# Patient Record
Sex: Female | Born: 1965 | Race: White | Hispanic: No | Smoking: Current every day smoker
Health system: Southern US, Community
[De-identification: ages and names within clinical notes are randomized; demographics above are authoritative.]

## PROBLEM LIST (undated history)

## (undated) ENCOUNTER — Emergency Department (HOSPITAL_COMMUNITY): Admission: EM | Payer: Medicaid Other | Source: Home / Self Care

## (undated) ENCOUNTER — Emergency Department (HOSPITAL_BASED_OUTPATIENT_CLINIC_OR_DEPARTMENT_OTHER): Payer: Medicaid Other | Source: Home / Self Care

## (undated) DIAGNOSIS — M545 Low back pain, unspecified: Secondary | ICD-10-CM

## (undated) DIAGNOSIS — J449 Chronic obstructive pulmonary disease, unspecified: Secondary | ICD-10-CM

## (undated) DIAGNOSIS — G8929 Other chronic pain: Secondary | ICD-10-CM

## (undated) DIAGNOSIS — F1410 Cocaine abuse, uncomplicated: Secondary | ICD-10-CM

## (undated) DIAGNOSIS — I10 Essential (primary) hypertension: Secondary | ICD-10-CM

## (undated) DIAGNOSIS — R011 Cardiac murmur, unspecified: Secondary | ICD-10-CM

## (undated) DIAGNOSIS — I639 Cerebral infarction, unspecified: Secondary | ICD-10-CM

## (undated) DIAGNOSIS — E785 Hyperlipidemia, unspecified: Secondary | ICD-10-CM

## (undated) DIAGNOSIS — D649 Anemia, unspecified: Secondary | ICD-10-CM

## (undated) HISTORY — PX: CHOLECYSTECTOMY: SHX55

## (undated) HISTORY — DX: Low back pain, unspecified: M54.50

## (undated) HISTORY — DX: Essential (primary) hypertension: I10

## (undated) HISTORY — PX: OTHER SURGICAL HISTORY: SHX169

## (undated) HISTORY — DX: Low back pain: M54.5

## (undated) HISTORY — PX: SALIVARY GLAND SURGERY: SHX768

## (undated) HISTORY — PX: ABDOMINAL HYSTERECTOMY: SHX81

## (undated) HISTORY — PX: BACK SURGERY: SHX140

## (undated) HISTORY — DX: Other chronic pain: G89.29

## (undated) HISTORY — PX: FRACTURE SURGERY: SHX138

---

## 2005-06-01 ENCOUNTER — Encounter
Admission: RE | Admit: 2005-06-01 | Discharge: 2005-08-30 | Payer: Self-pay | Admitting: Physical Medicine and Rehabilitation

## 2006-01-11 HISTORY — PX: OTHER SURGICAL HISTORY: SHX169

## 2006-10-01 ENCOUNTER — Emergency Department (HOSPITAL_COMMUNITY): Admission: EM | Admit: 2006-10-01 | Discharge: 2006-10-01 | Payer: Self-pay | Admitting: Emergency Medicine

## 2007-03-01 ENCOUNTER — Encounter: Admission: RE | Admit: 2007-03-01 | Discharge: 2007-03-01 | Payer: Self-pay | Admitting: Family Medicine

## 2008-08-15 ENCOUNTER — Encounter: Admission: RE | Admit: 2008-08-15 | Discharge: 2008-08-15 | Payer: Self-pay | Admitting: Neurosurgery

## 2009-06-18 ENCOUNTER — Encounter: Payer: Self-pay | Admitting: Cardiology

## 2009-06-20 ENCOUNTER — Encounter: Payer: Self-pay | Admitting: Cardiology

## 2009-12-05 ENCOUNTER — Encounter: Payer: Self-pay | Admitting: Cardiology

## 2009-12-05 DIAGNOSIS — R002 Palpitations: Secondary | ICD-10-CM | POA: Insufficient documentation

## 2009-12-05 DIAGNOSIS — F411 Generalized anxiety disorder: Secondary | ICD-10-CM | POA: Insufficient documentation

## 2009-12-05 DIAGNOSIS — F17210 Nicotine dependence, cigarettes, uncomplicated: Secondary | ICD-10-CM | POA: Insufficient documentation

## 2009-12-05 DIAGNOSIS — D649 Anemia, unspecified: Secondary | ICD-10-CM

## 2009-12-05 DIAGNOSIS — F17200 Nicotine dependence, unspecified, uncomplicated: Secondary | ICD-10-CM

## 2009-12-05 HISTORY — DX: Anemia, unspecified: D64.9

## 2010-02-02 ENCOUNTER — Encounter: Payer: Self-pay | Admitting: Neurosurgery

## 2010-02-10 NOTE — Miscellaneous (Signed)
  Clinical Lists Changes  Observations: Added new observation of PRIMARY MD: Benetta Spar, ANP-BC (12/05/2009 17:24)

## 2010-02-10 NOTE — Letter (Signed)
Summary: External Correspondence/ [facility]  External Correspondence/ [facility]   Imported By: Dorise Hiss 11/18/2009 16:21:43  _____________________________________________________________________  External Attachment:    Type:   Image     Comment:   External Document

## 2010-02-10 NOTE — Medication Information (Signed)
Summary: RX Folder/ [facility]  RX Folder/ [facility]   Imported By: Dorise Hiss 11/18/2009 16:23:43  _____________________________________________________________________  External Attachment:    Type:   Image     Comment:   External Document

## 2010-02-18 ENCOUNTER — Ambulatory Visit (INDEPENDENT_AMBULATORY_CARE_PROVIDER_SITE_OTHER): Payer: Medicaid Other | Admitting: Cardiology

## 2010-02-18 ENCOUNTER — Encounter: Payer: Self-pay | Admitting: Cardiology

## 2010-02-18 DIAGNOSIS — R072 Precordial pain: Secondary | ICD-10-CM | POA: Insufficient documentation

## 2010-02-18 DIAGNOSIS — R002 Palpitations: Secondary | ICD-10-CM

## 2010-02-25 ENCOUNTER — Encounter: Payer: Self-pay | Admitting: Internal Medicine

## 2010-02-26 NOTE — Assessment & Plan Note (Signed)
Summary: p eval:pal ps, pt cx'd 2022/12/15 and 12-24-22 appts d/t death in fami...   Visit Type:  Initial Consult Primary Provider:  Dr. Ardeen Garland  CC:  Palpitations and Chest Pain.  History of Present Illness: The patient presents for evaluation of palpitations and chest discomfort.this has been going on for approximately 6 months but has increased in frequency from every few weeks for 4 or 5 times per week. She describes episodes where her heart starts beating fast and pausing. She gets quite anxious. She will get some mid chest discomfort and tingling in her left arm. The tips of her fingers go numb. She might get short of breath and nauseated. The symptoms last from 10 minutes to one and a half hours. She might get slightly lightheaded but she has had no syncope. She has had no neck discomfort. She doesn't exercise but she walks routinely 2 blocks up a slight incline to the mailbox. She might get slightly dyspneic with this but this has been chronic. She does not have resting shortness of breath, PND or orthopnea. She cannot bring on the symptoms. She has had no prior cardiovascular workup.  Current Medications (verified): 1)  Neurontin 300 Mg Caps (Gabapentin) .... Three Times A Day 2)  Percocet 10-325 Mg Tabs (Oxycodone-Acetaminophen) .... Take 1 Tablet By Mouth Three Times A Day As Needed 3)  Estradiol 1 Mg Tabs (Estradiol) .... Take 1 Tablet By Mouth Once A Day 4)  Multivitamins  Tabs (Multiple Vitamin) .... Take 1 Tablet By Mouth Once A Day 5)  Opana Er 10 Mg Xr12h-Tab (Oxymorphone Hcl) .Marland Kitchen.. 1 Qam Anbd 2 At Bedtime 6)  Amoxicillin 875 Mg Tabs (Amoxicillin) .Marland Kitchen.. 1 By Mouth Daily  Allergies (verified): 1)  ! Cipro 2)  ! Sulfa 3)  ! Benadryl 4)  ! Augmentin 5)  ! 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  Disabled  Two children  Review of Systems       As stated in the HPI and negative for all other systems.   Vital Signs:  Patient profile:   45 year old female Height:      72 inches Weight:      203 pounds BMI:     27.63 Pulse rate:   63 / minute Resp:     16 per minute BP sitting:   102 / 84  (right arm)  Vitals Entered By: Marrion Coy, CNA (February 18, 2010 3:22 PM)  Physical Exam  General:  Well developed, well nourished, in no acute distress. Head:  normocephalic and atraumatic Eyes:  PERRLA/EOM intact; conjunctiva and lids normal. Mouth:  Edentulous. Oral mucosa normal. Neck:  Neck supple, no JVD. No masses, thyromegaly or abnormal cervical nodes. Chest Wall:  no deformities or breast masses noted Lungs:  Clear bilaterally to auscultation and percussion. Abdomen:  Bowel sounds positive; abdomen soft and non-tender without masses, organomegaly, or hernias noted. No hepatosplenomegaly. Msk:  Back normal, normal gait. Muscle strength and tone normal. Extremities:  No clubbing or cyanosis. Neurologic:  Alert and oriented x 3. Skin:  Intact without lesions or rashes. Cervical Nodes:  no significant adenopathy Inguinal Nodes:  no significant adenopathy Psych:  Normal affect.   Detailed  Cardiovascular Exam  Neck    Carotids: Carotids full and equal bilaterally without bruits.      Neck Veins: Normal, no JVD.    Heart    Inspection: no deformities or lifts noted.      Palpation: normal PMI with no thrills palpable.      Auscultation: regular rate and rhythm, S1, S2 without murmurs, rubs, gallops, or clicks.    Vascular    Abdominal Aorta: no palpable masses, pulsations, or audible bruits.      Femoral Pulses: normal femoral pulses bilaterally.      Pedal Pulses: normal pedal pulses bilaterally.      Radial Pulses: normal radial pulses bilaterally.       Peripheral Circulation: no clubbing, cyanosis, or edema noted with normal capillary refill.     EKG  Procedure date:  02/18/2010  Findings:      Sinus rhythm, rate 71, axis within normal limits, intervals within normal limits, no acute ST-T wave changes.  Impression & Recommendations:  Problem # 1:  CHEST PAIN, PRECORDIAL (ICD-786.51)  The patients chest pain has typical and atypcal features. She has significant cardiovascular risk factors. Given the stress perfusion imaging is indicated. She was to try to walk on a treadmill despite her back pain though this may need to be converted to perfusion imaging. Orders: Event (Event) Nuclear Stress Test (Nuc Stress Test)  Problem # 2:  TOBACCO ABUSE (ICD-305.1) We had a long discussion about the need to stop smoking (greater than 3 minutes)  and she has a plan in place.  Problem # 3:  PALPITATIONS (ICD-785.1) I will start with a 21 day event monitor, TSH and electrolytes. Orders: EKG w/ Interpretation (93000) Event (Event) Nuclear Stress Test (Nuc Stress Test)  Patient Instructions: 1)  Your physician recommends that you schedule a follow-up appointment after your testing 2)  Your physician recommends that you have lab work when you come in for your stress test (BMP, Mg and TSH 786.51 and palps) 3)  Your physician recommends that you continue on your current medications as directed. Please refer to the Current Medication list given to you today. 4)  Your physician has recommended that you wear an event monitor for 21 days.  Event monitors are medical devices that record the heart's electrical activity. Doctors most often use these monitors to diagnose arrhythmias. Arrhythmias are problems with the speed or rhythm of the heartbeat. The monitor is a small, portable device. You can wear one while you do your normal daily activities. This is usually used to diagnose what is causing palpitations/syncope (passing out). 5)  Your physician has  requested that you have an exercise stress myoview.  For further information please visit [email or web address]  Please follow instruction sheet, as given.

## 2010-03-03 ENCOUNTER — Other Ambulatory Visit: Payer: Self-pay

## 2010-03-03 ENCOUNTER — Encounter (HOSPITAL_COMMUNITY): Payer: Self-pay

## 2010-03-09 ENCOUNTER — Ambulatory Visit: Payer: Self-pay | Admitting: Cardiology

## 2010-03-13 ENCOUNTER — Telehealth: Payer: Self-pay | Admitting: Cardiology

## 2010-03-19 NOTE — Progress Notes (Signed)
Summary: question abouttaking heart monitor off  Phone Note Call from Patient Call back at Home Phone [phone]   Caller: Patient Summary of Call: Questions about when to take off heart monitor Initial call taken by: Judie Grieve,  March 13, 2010 10:34 AM  Follow-up for Phone Call        monitor due to be  taken off 03/16/10 but pt states her skin is so broken out from the electrodes there is no where else to put them.  Pt is going to remove monitor and mail it back into company as directed. Follow-up by: Charolotte Capuchin, RN,  March 13, 2010 11:06 AM

## 2010-03-27 ENCOUNTER — Telehealth (INDEPENDENT_AMBULATORY_CARE_PROVIDER_SITE_OTHER): Payer: Self-pay | Admitting: *Deleted

## 2010-03-30 ENCOUNTER — Encounter: Payer: Self-pay | Admitting: Cardiology

## 2010-03-30 ENCOUNTER — Other Ambulatory Visit (INDEPENDENT_AMBULATORY_CARE_PROVIDER_SITE_OTHER): Payer: Medicaid Other

## 2010-03-30 ENCOUNTER — Ambulatory Visit (HOSPITAL_COMMUNITY): Payer: Medicaid Other | Attending: Cardiology

## 2010-03-30 ENCOUNTER — Encounter (INDEPENDENT_AMBULATORY_CARE_PROVIDER_SITE_OTHER): Payer: Medicaid Other

## 2010-03-30 ENCOUNTER — Other Ambulatory Visit: Payer: Self-pay | Admitting: Cardiology

## 2010-03-30 DIAGNOSIS — R072 Precordial pain: Secondary | ICD-10-CM

## 2010-03-30 DIAGNOSIS — R002 Palpitations: Secondary | ICD-10-CM

## 2010-03-30 DIAGNOSIS — R0989 Other specified symptoms and signs involving the circulatory and respiratory systems: Secondary | ICD-10-CM

## 2010-03-30 DIAGNOSIS — R0789 Other chest pain: Secondary | ICD-10-CM

## 2010-03-30 DIAGNOSIS — R079 Chest pain, unspecified: Secondary | ICD-10-CM | POA: Insufficient documentation

## 2010-03-30 LAB — BASIC METABOLIC PANEL
BUN: 7 mg/dL (ref 6–23)
Calcium: 8.9 mg/dL (ref 8.4–10.5)
Creatinine, Ser: 0.6 mg/dL (ref 0.4–1.2)
GFR: 126.94 mL/min (ref >60.00)
Glucose, Bld: 124 mg/dL — ABNORMAL HIGH (ref 70–99)

## 2010-03-30 LAB — TSH: TSH: 2.82 u[IU]/mL (ref 0.35–5.50)

## 2010-03-31 NOTE — Progress Notes (Signed)
Summary: Nuclear Pre-Procedure  Phone Note Outgoing Call Call back at [facility] Phone [phone]   Call placed by: Stanton Kidney, EMT-P,  March 27, 2010 9:33 AM Summary of Call: Unable to leave message with information on Myoview Information Sheet (see scanned document for details); No answer/No machine. Stanton Kidney, EMT-P  March 27, 2010 9:34 AM      Nuclear Med Background Indications for Stress Test: Evaluation for Ischemia     Symptoms: Chest Pain, Light-Headedness, Nausea, Palpitations, Rapid HR, SOB    Nuclear Pre-Procedure Cardiac Risk Factors: Family History - CAD, Smoker Height (in): 72

## 2010-04-09 NOTE — Assessment & Plan Note (Addendum)
Summary: Cardiology Nuclear Testing  Nuclear Med Background Indications for Stress Test: Evaluation for Ischemia    History Comments: No documented CAD  Symptoms: Chest Tightness, DOE, Light-Headedness, Palpitations, Rapid HR, SOB  Symptoms Comments: Last episode of CP:one week.   Nuclear Pre-Procedure Cardiac Risk Factors: Family History - CAD, Smoker Caffeine/Decaff Intake: None NPO After: 12:00 AM Lungs: Clear.  O2 sat 98% on RA. IV 0.9% NS with Angio Cath: 20g     IV Site: R Antecubital IV Started by: Stanton Kidney, EMT-P Chest Size (in) 34     Cup Size C     Height (in): 72 Weight (lb): 200 BMI: 27.22  Nuclear Med Study 1 or 2 day study:  1 day     Stress Test Type:  Stress Reading MD:  Olga Millers, MD     Referring MD:  Rollene Rotunda, MD Resting Radionuclide:  Technetium 60m Tetrofosmin     Resting Radionuclide Dose:  10.8 mCi  Stress Radionuclide:  Technetium 30m Tetrofosmin     Stress Radionuclide Dose:  33 mCi   Stress Protocol Exercise Time (min):  10:01 min     Max HR:  151 bpm     Predicted Max HR:  175 bpm  Max Systolic BP: 168 mm Hg     Percent Max HR:  86.29 %     METS: 11.5 Rate Pressure Product:  16109    Stress Test Technologist:  Rea [employer], CMA-N     Nuclear Technologist:  Doyne Keel, CNMT  Rest Procedure  Myocardial perfusion imaging was performed at rest 45 minutes following the intravenous administration of Technetium 18m Tetrofosmin.  Stress Procedure  The patient exercised for 10:01 on the treadmill utilizing the Bruce protocol.  The patient stopped due to fatigue and denied any chest pain.  There were no diagnostic ST-T wave changes.  There were occasional PVC's and rare PAC's.  Technetium 81m Tetrofosmin was injected at peak exercise and myocardial perfusion imaging was performed after a brief delay.  QPS Raw Data Images:  Acquisition technically good; normal left ventricular size. Stress Images:  Normal homogeneous uptake in all areas of  the myocardium. Rest Images:  Normal homogeneous uptake in all areas of the myocardium. Subtraction (SDS):  No evidence of ischemia. Transient Ischemic Dilatation:  1.08  (Normal <1.22)  Lung/Heart Ratio:  0.27  (Normal <0.45)  Quantitative Gated Spect Images QGS EDV:  84 ml QGS ESV:  28 ml QGS EF:  67 % QGS cine images:  Normal wall motion.   Overall Impression  Exercise Capacity: Good exercise capacity. BP Response: Normal blood pressure response. Clinical Symptoms: No chest pain ECG Impression: No significant ST segment change suggestive of ischemia. Overall Impression: Normal stress nuclear study with no ischemia or infarction.  Appended Document: Cardiology Nuclear Testing Negative nuclear study.  She was not able to wear the monitor because of rash.  Schedule a follow up appt if she is still having symptoms.

## 2010-04-21 ENCOUNTER — Telehealth: Payer: Self-pay | Admitting: Cardiology

## 2010-04-21 ENCOUNTER — Telehealth: Payer: Self-pay | Admitting: *Deleted

## 2010-04-21 NOTE — Telephone Encounter (Signed)
Pt left walk in message that she had not yet received her stress test results performed on 03/30/10.  I attempted to call her home x2 with no answer and no answering machine.  Her mobile number was not correct. Mylo Red RN

## 2010-04-21 NOTE — Telephone Encounter (Signed)
Walk in Pt Form" Pt needs results from Stress Test" done 03/30/10 sent to Sent to Message Nurse 04/21/10/KM

## 2010-04-23 ENCOUNTER — Telehealth: Payer: Self-pay | Admitting: *Deleted

## 2010-04-23 NOTE — Telephone Encounter (Addendum)
I was able to talk with Alice [name] today and give her her stress test results from 03/30/10.  She inquired about her monitor results.  Monitor results found and given to patient.  She will reduce her excessive caffeine intake as per Dr. Joyce Copa suggestion.  She is not symptomatic. Mylo Red RN

## 2010-07-29 ENCOUNTER — Other Ambulatory Visit: Payer: Self-pay | Admitting: Physical Medicine and Rehabilitation

## 2010-07-29 DIAGNOSIS — M79609 Pain in unspecified limb: Secondary | ICD-10-CM

## 2010-08-04 ENCOUNTER — Other Ambulatory Visit: Payer: Medicaid Other

## 2010-08-10 ENCOUNTER — Ambulatory Visit
Admission: RE | Admit: 2010-08-10 | Discharge: 2010-08-10 | Disposition: A | Discharge status: Home / Self Care | Payer: Medicaid Other | Source: Ambulatory Visit | Attending: Physical Medicine and Rehabilitation | Admitting: Physical Medicine and Rehabilitation

## 2010-08-10 DIAGNOSIS — M79609 Pain in unspecified limb: Secondary | ICD-10-CM

## 2010-08-10 MED ORDER — GADOBENATE DIMEGLUMINE 529 MG/ML IV SOLN
19.0000 mL | Freq: Once | INTRAVENOUS | Status: AC | PRN
  Administered 2010-08-10: 19 mL via INTRAVENOUS

## 2010-08-14 ENCOUNTER — Encounter: Payer: Self-pay | Admitting: Cardiology

## 2011-07-29 ENCOUNTER — Emergency Department (HOSPITAL_COMMUNITY)
Admission: EM | Admit: 2011-07-29 | Discharge: 2011-07-30 | Disposition: A | Discharge status: Other Acute Inpatient Hospital | Payer: 59 | Source: Home / Self Care | Attending: Emergency Medicine | Admitting: Emergency Medicine

## 2011-07-29 ENCOUNTER — Encounter (HOSPITAL_COMMUNITY): Payer: Self-pay | Admitting: Emergency Medicine

## 2011-07-29 DIAGNOSIS — F1110 Opioid abuse, uncomplicated: Secondary | ICD-10-CM

## 2011-07-29 DIAGNOSIS — I10 Essential (primary) hypertension: Secondary | ICD-10-CM | POA: Insufficient documentation

## 2011-07-29 DIAGNOSIS — F17200 Nicotine dependence, unspecified, uncomplicated: Secondary | ICD-10-CM | POA: Insufficient documentation

## 2011-07-29 DIAGNOSIS — Z881 Allergy status to other antibiotic agents status: Secondary | ICD-10-CM | POA: Insufficient documentation

## 2011-07-29 DIAGNOSIS — Z880 Allergy status to penicillin: Secondary | ICD-10-CM | POA: Insufficient documentation

## 2011-07-29 DIAGNOSIS — N390 Urinary tract infection, site not specified: Secondary | ICD-10-CM | POA: Insufficient documentation

## 2011-07-29 DIAGNOSIS — F1920 Other psychoactive substance dependence, uncomplicated: Secondary | ICD-10-CM | POA: Insufficient documentation

## 2011-07-29 DIAGNOSIS — Z882 Allergy status to sulfonamides status: Secondary | ICD-10-CM | POA: Insufficient documentation

## 2011-07-29 HISTORY — DX: Cardiac murmur, unspecified: R01.1

## 2011-07-29 LAB — RAPID URINE DRUG SCREEN, HOSP PERFORMED
Amphetamines: NOT DETECTED
Barbiturates: NOT DETECTED
Opiates: POSITIVE — AB
Tetrahydrocannabinol: NOT DETECTED

## 2011-07-29 LAB — COMPREHENSIVE METABOLIC PANEL
ALT: 21 U/L (ref 0–35)
Albumin: 4.1 g/dL (ref 3.5–5.2)
Alkaline Phosphatase: 73 U/L (ref 39–117)
Potassium: 3.9 mEq/L (ref 3.5–5.1)
Sodium: 141 mEq/L (ref 135–145)
Total Protein: 8.7 g/dL — ABNORMAL HIGH (ref 6.0–8.3)

## 2011-07-29 LAB — CBC WITH DIFFERENTIAL/PLATELET
Basophils Relative: 0 % (ref 0–1)
Eosinophils Absolute: 0 10*3/uL (ref 0.0–0.7)
Lymphs Abs: 2.1 10*3/uL (ref 0.7–4.0)
MCH: 32.1 pg (ref 26.0–34.0)
MCHC: 34.6 g/dL (ref 30.0–36.0)
Neutro Abs: 6.2 10*3/uL (ref 1.7–7.7)
Neutrophils Relative %: 69 % (ref 43–77)
Platelets: 255 10*3/uL (ref 150–400)
RBC: 4.52 MIL/uL (ref 3.87–5.11)

## 2011-07-29 LAB — URINALYSIS, ROUTINE W REFLEX MICROSCOPIC
Bilirubin Urine: NEGATIVE
Glucose, UA: NEGATIVE mg/dL
Hgb urine dipstick: NEGATIVE
Protein, ur: NEGATIVE mg/dL

## 2011-07-29 LAB — URINE MICROSCOPIC-ADD ON

## 2011-07-29 MED ORDER — HYDROXYZINE HCL 25 MG PO TABS
25.0000 mg | ORAL_TABLET | Freq: Four times a day (QID) | ORAL | Status: DC | PRN
  Administered 2011-07-29: 25 mg via ORAL
  Filled 2011-07-29: qty 1

## 2011-07-29 MED ORDER — LORAZEPAM 1 MG PO TABS
1.0000 mg | ORAL_TABLET | Freq: Three times a day (TID) | ORAL | Status: DC | PRN
  Administered 2011-07-29: 1 mg via ORAL
  Filled 2011-07-29: qty 1

## 2011-07-29 MED ORDER — ESTRADIOL 1 MG PO TABS: 1.0000 mg | ORAL_TABLET | Freq: Every day | ORAL | Status: DC

## 2011-07-29 MED ORDER — DICYCLOMINE HCL 20 MG PO TABS: 20.0000 mg | ORAL_TABLET | Freq: Four times a day (QID) | ORAL | Status: DC | PRN

## 2011-07-29 MED ORDER — NITROFURANTOIN MONOHYD MACRO 100 MG PO CAPS
100.0000 mg | ORAL_CAPSULE | Freq: Two times a day (BID) | ORAL | Status: DC
  Administered 2011-07-29: 100 mg via ORAL
  Filled 2011-07-29: qty 1

## 2011-07-29 MED ORDER — CLONIDINE HCL 0.1 MG PO TABS
0.1000 mg | ORAL_TABLET | Freq: Four times a day (QID) | ORAL | Status: DC
  Administered 2011-07-29 (×3): 0.1 mg via ORAL
  Filled 2011-07-29 (×3): qty 1

## 2011-07-29 MED ORDER — NAPROXEN 500 MG PO TABS
500.0000 mg | ORAL_TABLET | Freq: Two times a day (BID) | ORAL | Status: DC | PRN
  Administered 2011-07-29: 500 mg via ORAL
  Filled 2011-07-29: qty 1

## 2011-07-29 MED ORDER — ALUM & MAG HYDROXIDE-SIMETH 200-200-20 MG/5ML PO SUSP
30.0000 mL | ORAL | Status: DC | PRN
Start: 2011-07-29 — End: 2011-07-30

## 2011-07-29 MED ORDER — ZOLPIDEM TARTRATE 5 MG PO TABS
5.0000 mg | ORAL_TABLET | Freq: Every evening | ORAL | Status: DC | PRN
  Administered 2011-07-29: 5 mg via ORAL
  Filled 2011-07-29: qty 1

## 2011-07-29 MED ORDER — METHOCARBAMOL 500 MG PO TABS
500.0000 mg | ORAL_TABLET | Freq: Three times a day (TID) | ORAL | Status: DC | PRN
  Administered 2011-07-29: 500 mg via ORAL
  Filled 2011-07-29: qty 1

## 2011-07-29 MED ORDER — CLONIDINE HCL 0.1 MG PO TABS: 0.1000 mg | ORAL_TABLET | Freq: Every day | ORAL | Status: DC

## 2011-07-29 MED ORDER — ONDANSETRON 4 MG PO TBDP: 4.0000 mg | ORAL_TABLET | Freq: Four times a day (QID) | ORAL | Status: DC | PRN

## 2011-07-29 MED ORDER — NICOTINE 21 MG/24HR TD PT24
21.0000 mg | MEDICATED_PATCH | Freq: Every day | TRANSDERMAL | Status: DC
  Administered 2011-07-29: 21 mg via TRANSDERMAL
  Filled 2011-07-29: qty 1

## 2011-07-29 MED ORDER — LOPERAMIDE HCL 2 MG PO CAPS: 2.0000 mg | ORAL_CAPSULE | ORAL | Status: DC | PRN

## 2011-07-29 MED ORDER — CLONIDINE HCL 0.1 MG PO TABS: 0.1000 mg | ORAL_TABLET | ORAL | Status: DC

## 2011-07-29 NOTE — ED Provider Notes (Addendum)
History     CSN: [id number]  Arrival date & time 07/29/11  1420   First MD Initiated Contact with Patient 07/29/11 1442      Chief Complaint  Patient presents with  . detox   . Suicidal    (Consider location/radiation/quality/duration/timing/severity/associated sxs/prior treatment) The history is provided by the patient and a friend.  46 y/o F with chronic back pain s/p remote back injury and subsequent surgery presents to ED with c/c narcotic abuse and request for assistance with detox. Has been taking both her own and other people's narcotics for some time. Denies current recreational drug use, though she does have a history of such. Today felt an urge to buy and use crack cocaine. She did not go through with it and upon returning home, had thoughts of shooting herself as she was so upset about her near-use. She denies feeling suicidal. Denies HI or hallucinations. C/o pain "all over". No prior treatment attempted, denies prior inpatient assistance with psychiatric issues.  Past Medical History  Diagnosis Date  . Anemia   . Heart murmur     Past Surgical History  Procedure Date  . Back surgery     hardware in back  . Abdominal hysterectomy     abd?  . Tumor reomoved from stomach   . Cyst removed from ovary   . Removal of bb in finger 2008    History reviewed. No pertinent family history.  History  Substance Use Topics  . Smoking status: Smoker, Current Status Unknown -- 1.0 packs/day  . Smokeless tobacco: Not on file  . Alcohol Use: No     Review of Systems 10 systems reviewed and are negative for acute change except as noted in the HPI.  Allergies  Amoxicillin-pot clavulanate; Ciprofloxacin; Diphenhydramine hcl; Sulfonamide derivatives; and Tetracycline  Home Medications   Current Outpatient Rx  Name Route Sig Dispense Refill  . AMOXICILLIN 875 MG PO TABS Oral Take 875 mg by mouth daily.      Marland Kitchen ESTRADIOL 1 MG PO TABS Oral Take 1 mg by mouth daily.      .  ADULT MULTIVITAMIN W/MINERALS CH Oral Take 1 tablet by mouth daily.    . OXYCODONE-ACETAMINOPHEN 10-325 MG PO TABS Oral Take 1 tablet by mouth every 2 (two) hours as needed. For pain.    Marland Kitchen OXYMORPHONE HCL ER 15 MG PO TB12 Oral Take 15 mg by mouth every 6 (six) hours.    Audrie Lia HCL ER 30 MG PO TB12 Oral Take 30 mg by mouth every 6 (six) hours.    . TRAMADOL HCL 50 MG PO TABS Oral Take 150-200 mg by mouth every 4 (four) hours as needed. For pain.      BP 153/103  Pulse 92  Temp 98.5 F (36.9 C) (Oral)  Resp 18  SpO2 98%  Physical Exam  Nursing note reviewed. Constitutional: She is oriented to person, place, and time. She appears well-developed and well-nourished.       VS reviewed, sig for HTN. Appears anxious  HENT:  Head: Normocephalic and atraumatic.       MMM  Eyes: Conjunctivae are normal. Pupils are equal, round, and reactive to light.  Neck: Neck supple.  Cardiovascular: Normal rate and regular rhythm.   Pulmonary/Chest: Effort normal and breath sounds normal. No respiratory distress.  Abdominal: Soft. Bowel sounds are normal. She exhibits no distension. There is no tenderness.  Musculoskeletal: She exhibits no edema.  Neurological: She is alert and oriented to person,  place, and time.  Skin: Skin is warm and dry.  Psychiatric: Her speech is normal. Her mood appears anxious. She is not actively hallucinating. Thought content is not paranoid. She exhibits a depressed mood. She expresses no homicidal and no suicidal ideation.    ED Course  Procedures (including critical care time)  Labs Reviewed  URINALYSIS, ROUTINE W REFLEX MICROSCOPIC - Abnormal; Notable for the following:    Color, Urine AMBER (*)  BIOCHEMICALS MAY BE AFFECTED BY COLOR   APPearance TURBID (*)     Specific Gravity, Urine 1.031 (*)     Leukocytes, UA MODERATE (*)     All other components within normal limits  URINE RAPID DRUG SCREEN (HOSP PERFORMED) - Abnormal; Notable for the following:     Opiates POSITIVE (*)     Benzodiazepines POSITIVE (*)     All other components within normal limits  COMPREHENSIVE METABOLIC PANEL - Abnormal; Notable for the following:    Total Protein 8.7 (*)     All other components within normal limits  URINE MICROSCOPIC-ADD ON - Abnormal; Notable for the following:    Squamous Epithelial / LPF MANY (*)     Bacteria, UA MANY (*)     All other components within normal limits  CBC WITH DIFFERENTIAL  ETHANOL  URINE CULTURE   No results found.   Date: 08/11/2011  Rate: 97  Rhythm: sinus  QRS Axis: normal  Intervals: normal  ST/T Wave abnormalities: nonspecific T wave changes  Conduction Disutrbances:none  Narrative Interpretation: rate increased from prior. V1,V2 TWI with flattened T in V3 V4 is unchanged  Old EKG Reviewed: changes noted fromprior dated 02/18/10    1. Narcotic abuse       MDM  Narcotic use/abuse. No SI/HI. No physical complaints. Labs reviewed- UTI, will tx with macrobid and send urine culture given multiple abx allergies. Clonidine opiate withdrawal protocol orders initiated, psych holding orders placed.   7:47 PM EDP Effie Shy has been given report on pt for evening care. VS stable, HTN improved, no tachycardia on last check.        Shaaron Adler, PA-C 07/29/11 1948  Shaaron Adler, PA-C 08/11/11 1455

## 2011-07-29 NOTE — ED Notes (Signed)
Wants detox from prescription meds. 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 Drove to get crack today, but didn't. When got home, felt like hurting self. Had a loaded gun, thought about shooting self in abd. Never picked up the gun. Denies wanting to hurt self now.
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
## 2011-07-29 NOTE — ED Notes (Signed)
Patient has on blue scrubs and has been wanded by security.  Patient has one personal belonging bag.

## 2011-07-29 NOTE — ED Notes (Signed)
Report to Ardine Eng pt to Psych ed

## 2011-07-30 ENCOUNTER — Inpatient Hospital Stay (HOSPITAL_COMMUNITY)
Admission: AD | Admit: 2011-07-30 | Discharge: 2011-08-04 | DRG: 897 | Disposition: A | Discharge status: Home / Self Care | Payer: 59 | Source: Ambulatory Visit | Attending: Psychiatry | Admitting: Psychiatry

## 2011-07-30 ENCOUNTER — Encounter (HOSPITAL_COMMUNITY): Payer: Self-pay | Admitting: *Deleted

## 2011-07-30 DIAGNOSIS — I10 Essential (primary) hypertension: Secondary | ICD-10-CM | POA: Diagnosis present

## 2011-07-30 DIAGNOSIS — F19939 Other psychoactive substance use, unspecified with withdrawal, unspecified: Principal | ICD-10-CM | POA: Diagnosis present

## 2011-07-30 DIAGNOSIS — F411 Generalized anxiety disorder: Secondary | ICD-10-CM

## 2011-07-30 DIAGNOSIS — F1010 Alcohol abuse, uncomplicated: Secondary | ICD-10-CM | POA: Diagnosis present

## 2011-07-30 DIAGNOSIS — F1421 Cocaine dependence, in remission: Secondary | ICD-10-CM | POA: Diagnosis present

## 2011-07-30 DIAGNOSIS — F1120 Opioid dependence, uncomplicated: Secondary | ICD-10-CM | POA: Diagnosis present

## 2011-07-30 DIAGNOSIS — G4700 Insomnia, unspecified: Secondary | ICD-10-CM | POA: Diagnosis present

## 2011-07-30 DIAGNOSIS — N390 Urinary tract infection, site not specified: Secondary | ICD-10-CM | POA: Diagnosis present

## 2011-07-30 DIAGNOSIS — R112 Nausea with vomiting, unspecified: Secondary | ICD-10-CM

## 2011-07-30 DIAGNOSIS — F10239 Alcohol dependence with withdrawal, unspecified: Secondary | ICD-10-CM

## 2011-07-30 DIAGNOSIS — Z79899 Other long term (current) drug therapy: Secondary | ICD-10-CM

## 2011-07-30 MED ORDER — QUETIAPINE FUMARATE 50 MG PO TABS
50.0000 mg | ORAL_TABLET | Freq: Once | ORAL | Status: AC
  Administered 2011-07-30: 50 mg via ORAL
  Filled 2011-07-30 (×2): qty 1

## 2011-07-30 MED ORDER — DICYCLOMINE HCL 20 MG PO TABS
20.0000 mg | ORAL_TABLET | Freq: Four times a day (QID) | ORAL | Status: AC | PRN
  Administered 2011-07-31 – 2011-08-01 (×4): 20 mg via ORAL
  Filled 2011-07-30 (×4): qty 1

## 2011-07-30 MED ORDER — CLONIDINE HCL 0.1 MG PO TABS
0.1000 mg | ORAL_TABLET | ORAL | Status: AC
  Administered 2011-08-01 – 2011-08-02 (×4): 0.1 mg via ORAL
  Filled 2011-07-30 (×4): qty 1

## 2011-07-30 MED ORDER — CLONIDINE HCL 0.1 MG PO TABS
0.1000 mg | ORAL_TABLET | Freq: Four times a day (QID) | ORAL | Status: AC
  Administered 2011-07-30 – 2011-08-01 (×10): 0.1 mg via ORAL
  Filled 2011-07-30 (×14): qty 1

## 2011-07-30 MED ORDER — ONDANSETRON 4 MG PO TBDP
4.0000 mg | ORAL_TABLET | Freq: Four times a day (QID) | ORAL | Status: AC | PRN
  Administered 2011-07-30 – 2011-08-03 (×9): 4 mg via ORAL
  Filled 2011-07-30 (×4): qty 1

## 2011-07-30 MED ORDER — HYDROXYZINE HCL 25 MG PO TABS
25.0000 mg | ORAL_TABLET | Freq: Four times a day (QID) | ORAL | Status: AC | PRN
  Administered 2011-07-30 – 2011-08-03 (×12): 25 mg via ORAL
  Filled 2011-07-30 (×2): qty 1

## 2011-07-30 MED ORDER — ALUM & MAG HYDROXIDE-SIMETH 200-200-20 MG/5ML PO SUSP
30.0000 mL | ORAL | Status: DC | PRN
  Administered 2011-08-01 – 2011-08-04 (×6): 30 mL via ORAL

## 2011-07-30 MED ORDER — MAGNESIUM HYDROXIDE 400 MG/5ML PO SUSP: 30.0000 mL | Freq: Every day | ORAL | Status: DC | PRN

## 2011-07-30 MED ORDER — ACETAMINOPHEN 325 MG PO TABS
650.0000 mg | ORAL_TABLET | Freq: Four times a day (QID) | ORAL | Status: DC | PRN
  Administered 2011-07-30 – 2011-08-04 (×9): 650 mg via ORAL

## 2011-07-30 MED ORDER — NAPROXEN 500 MG PO TABS
500.0000 mg | ORAL_TABLET | Freq: Two times a day (BID) | ORAL | Status: AC | PRN
  Administered 2011-07-30 – 2011-08-03 (×7): 500 mg via ORAL
  Filled 2011-07-30 (×8): qty 1

## 2011-07-30 MED ORDER — NICOTINE 21 MG/24HR TD PT24
21.0000 mg | MEDICATED_PATCH | Freq: Every day | TRANSDERMAL | Status: DC
  Administered 2011-07-30 – 2011-08-03 (×5): 21 mg via TRANSDERMAL
  Filled 2011-07-30 (×10): qty 1

## 2011-07-30 MED ORDER — LOPERAMIDE HCL 2 MG PO CAPS
2.0000 mg | ORAL_CAPSULE | ORAL | Status: AC | PRN
  Administered 2011-08-02: 4 mg via ORAL

## 2011-07-30 MED ORDER — METHOCARBAMOL 500 MG PO TABS
500.0000 mg | ORAL_TABLET | Freq: Three times a day (TID) | ORAL | Status: AC | PRN
  Administered 2011-07-30 – 2011-08-02 (×12): 500 mg via ORAL
  Filled 2011-07-30 (×13): qty 1

## 2011-07-30 MED ORDER — CLONIDINE HCL 0.1 MG PO TABS
0.1000 mg | ORAL_TABLET | Freq: Every day | ORAL | Status: AC
  Administered 2011-08-03 (×2): 0.1 mg via ORAL
  Filled 2011-07-30: qty 1

## 2011-07-30 NOTE — Progress Notes (Signed)
[facility] Group Notes:  (Counselor/Nursing/MHT/Case Management/Adjunct)  07/30/2011 3:07 PM  Type of Therapy:  Group Therapy 1:15 to 2:30  Participation Level:  Did Not Attend  Clide Dales 07/30/2011, 3:07 PM

## 2011-07-30 NOTE — Progress Notes (Signed)
Psychoeducational Group Note  Date:  07/30/2011 Time:  1100  Group Topic/Focus:  Relapse Prevention Planning:   The focus of this group is to define relapse and discuss the need for planning to combat relapse.  Participation Level:  Did Not Attend  Additional Comments:  Pt did not attend group.   Dalia Heading 07/30/2011, 1:59 PM

## 2011-07-30 NOTE — Progress Notes (Addendum)
[facility] Group Notes:  (Counselor/Nursing/MHT/Case Management/Adjunct)  07/30/2011 1:58 PM  Type of Therapy:  Psychoeducational Skills  Participation Level:  Did Not Attend  Modes of Intervention:  Activity  Summary of Progress/Problems: Pt did not attend [employer].   Dalia Heading 07/30/2011, 1:58 PM

## 2011-07-30 NOTE — ED Provider Notes (Signed)
Medical screening examination/treatment/procedure(s) were performed by non-physician practitioner and as supervising physician I was immediately available for consultation/collaboration.   Gerhard Munch, MD 07/30/11 [phone]

## 2011-07-30 NOTE — [facility] Suicide Risk Assessment (Signed)
Suicide Risk Assessment  Admission Assessment     Demographic factors:    Current Mental Status:    patient is alert and oriented but seemed anxious. Loss Factors:    Historical Factors:    patient has chronic back pain Risk Reduction Factors:    patient presented to the ER wanting to come off her pain medications as stated that she was tired of seeking drugs on the street  CLINICAL FACTORS:   Severe Anxiety and/or Agitation Alcohol/Substance Abuse/Dependencies  COGNITIVE FEATURES THAT CONTRIBUTE TO RISK:  Polarized thinking    SUICIDE RISK:   Mild:  Suicidal ideation of limited frequency, intensity, duration, and specificity.  There are no identifiable plans, no associated intent, mild dysphoria and related symptoms, good self-control (both objective and subjective assessment), few other risk factors, and identifiable protective factors, including available and accessible social support.  PLAN OF CARE: The patient to undergo detox during this inpatient stay Will get a consult for pain management as patient is being tapered off her opiates Patient to attend meetings during this hospitalization and needs to attend NA meetings on discharge The patient needs to have outpatient followup on discharge   [facility] 07/30/2011, 2:48 PM
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
## 2011-07-30 NOTE — ED Provider Notes (Signed)
Pt accepted at [facility]  Flint Melter, MD 07/30/11 [phone]

## 2011-07-30 NOTE — ED Notes (Signed)
Patient discharge via ambulatory with a steady gait. Respirations equal and unlabored. Skin warm and dry. No acute distress noted. 

## 2011-07-30 NOTE — Progress Notes (Signed)
Writer met with patient in her room.  She reports admitting to the hospital due to be addicted to prescription medications.  She advised of taking six to eight 30 gram opanas and twelve to fourteen 10 mg percocets.  She reports she is obtaining pills legally and illegally.  Patient reports having home and transportation.  She will need assistance with meds and outpatient follow up.
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
## 2011-07-30 NOTE — Progress Notes (Signed)
Patient ID: Alice [name], female   DOB: [birth year], 46 y.o.   MRN: [id number] She  Has been up for medication and meals. Has c/o and received prn medication  for withdrawal anxiety that had little effect, Seroquel x 1 ordered and given  And is asleep at this time.

## 2011-07-31 ENCOUNTER — Encounter (HOSPITAL_COMMUNITY): Payer: Self-pay | Admitting: *Deleted

## 2011-07-31 LAB — POCT I-STAT, CHEM 8
BUN: 11 mg/dL (ref 6–23)
Chloride: 106 mEq/L (ref 96–112)
Creatinine, Ser: 0.7 mg/dL (ref 0.50–1.10)
Sodium: 143 mEq/L (ref 135–145)
TCO2: 24 mmol/L (ref 0–100)

## 2011-07-31 LAB — URINALYSIS, ROUTINE W REFLEX MICROSCOPIC
Nitrite: NEGATIVE
Specific Gravity, Urine: 1.036 — ABNORMAL HIGH (ref 1.005–1.030)
Urobilinogen, UA: 1 mg/dL (ref 0.0–1.0)
pH: 5.5 (ref 5.0–8.0)

## 2011-07-31 LAB — COMPREHENSIVE METABOLIC PANEL
AST: 19 U/L (ref 0–37)
Albumin: 4.8 g/dL (ref 3.5–5.2)
Alkaline Phosphatase: 78 U/L (ref 39–117)
Chloride: 100 mEq/L (ref 96–112)
Potassium: 3.4 mEq/L — ABNORMAL LOW (ref 3.5–5.1)
Total Bilirubin: 0.7 mg/dL (ref 0.3–1.2)
Total Protein: 8.8 g/dL — ABNORMAL HIGH (ref 6.0–8.3)

## 2011-07-31 LAB — CBC WITH DIFFERENTIAL/PLATELET
Basophils Absolute: 0 10*3/uL (ref 0.0–0.1)
Basophils Relative: 0 % (ref 0–1)
HCT: 41.5 % (ref 36.0–46.0)
Lymphocytes Relative: 20 % (ref 12–46)
Neutro Abs: 7.9 10*3/uL — ABNORMAL HIGH (ref 1.7–7.7)
Neutrophils Relative %: 72 % (ref 43–77)
Platelets: 262 10*3/uL (ref 150–400)
RDW: 12.2 % (ref 11.5–15.5)
WBC: 11 10*3/uL — ABNORMAL HIGH (ref 4.0–10.5)

## 2011-07-31 LAB — URINE MICROSCOPIC-ADD ON

## 2011-07-31 LAB — LIPASE, BLOOD: Lipase: 23 U/L (ref 11–59)

## 2011-07-31 MED ORDER — METOCLOPRAMIDE HCL 5 MG/ML IJ SOLN
10.0000 mg | Freq: Once | INTRAMUSCULAR | Status: AC
  Administered 2011-07-31: 10 mg via INTRAVENOUS
  Filled 2011-07-31: qty 2

## 2011-07-31 MED ORDER — GI COCKTAIL ~~LOC~~
30.0000 mL | Freq: Once | ORAL | Status: AC
  Administered 2011-07-31: 30 mL via ORAL
  Filled 2011-07-31: qty 30

## 2011-07-31 MED ORDER — SODIUM CHLORIDE 0.9 % IV BOLUS (SEPSIS)
1000.0000 mL | Freq: Once | INTRAVENOUS | Status: AC
  Administered 2011-07-31: 1000 mL via INTRAVENOUS

## 2011-07-31 MED ORDER — ONDANSETRON HCL 4 MG/2ML IJ SOLN
INTRAMUSCULAR | Status: AC
  Administered 2011-07-31: 4 mg
  Filled 2011-07-31: qty 2

## 2011-07-31 MED ORDER — METOCLOPRAMIDE HCL 10 MG PO TABS: 10.0000 mg | ORAL_TABLET | Freq: Four times a day (QID) | ORAL | Status: DC

## 2011-07-31 MED ORDER — TRAZODONE HCL 50 MG PO TABS
50.0000 mg | ORAL_TABLET | Freq: Every day | ORAL | Status: DC
  Administered 2011-07-31 – 2011-08-01 (×2): 50 mg via ORAL
  Filled 2011-07-31 (×4): qty 1

## 2011-07-31 MED ORDER — ONDANSETRON HCL 4 MG/2ML IJ SOLN
4.0000 mg | Freq: Once | INTRAMUSCULAR | Status: AC
  Administered 2011-07-31: 4 mg via INTRAVENOUS

## 2011-07-31 MED ORDER — HYDROXYZINE HCL 50 MG PO TABS
50.0000 mg | ORAL_TABLET | Freq: Once | ORAL | Status: AC
  Administered 2011-07-31: 50 mg via ORAL
  Filled 2011-07-31 (×2): qty 1
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
## 2011-07-31 NOTE — Progress Notes (Signed)
[facility] Group Notes:  (Counselor/Nursing/MHT/Case Management/Adjunct)  07/31/2011 10:09 PM  Type of Therapy:  Psychoeducational Skills  Participation Level:  Active  Participation Quality:  Appropriate  Affect:  Appropriate  Cognitive:  Appropriate  Insight:  Good  Engagement in Group:  Good  Engagement in Therapy:  Good  Modes of Intervention:  Support  Summary of Progress/Problems:   Christ Kick 07/31/2011, 10:09 PM

## 2011-07-31 NOTE — Progress Notes (Signed)
Psychoeducational Group Note  Date: 07/31/2011 Time:  1015  Group Topic/Focus:  Identifying Needs:   The focus of this group is to help patients identify their personal needs that have been historically problematic and identify healthy behaviors to address their needs.  Participation Level:  Did Not Attend   Valeria Boza A  

## 2011-07-31 NOTE — Progress Notes (Signed)
  Alice [name] is a 46 y.o. female [id number] 1965/02/10  07/30/2011 Principal Problem:  *Opiate dependence   Mental Status: Alert & oriented -having active withdrawal is not suicidal or homicidal.    Subjective/Objective: Has just returned from the ED was cleared from any trauma. Hit her head this am.  Filed Vitals:   07/31/11 1053  BP: 153/81  Pulse: 89  Temp:   Resp: 18    Lab Results:   BMET    Component Value Date/Time   NA 143 07/31/2011 1006   K 3.5 07/31/2011 1006   CL 106 07/31/2011 1006   CO2 23 07/31/2011 0954   GLUCOSE 119* 07/31/2011 1006   BUN 11 07/31/2011 1006   CREATININE 0.70 07/31/2011 1006   CALCIUM 9.9 07/31/2011 0954   GFRNONAA >90 07/31/2011 0954   GFRAA >90 07/31/2011 0954    Medications:  Scheduled:     . cloNIDine  0.1 mg Oral QID   Followed by  . cloNIDine  0.1 mg Oral BH-qamhs   Followed by  . cloNIDine  0.1 mg Oral QAC breakfast  . gi cocktail  30 mL Oral Once  . hydrOXYzine  50 mg Oral Once  . metoCLOPramide (REGLAN) injection  10 mg Intravenous Once  . nicotine  21 mg Transdermal Q0600  . ondansetron      . ondansetron (ZOFRAN) IV  4 mg Intravenous Once  . sodium chloride  1,000 mL Intravenous Once     PRN Meds acetaminophen, alum & mag hydroxide-simeth, dicyclomine, hydrOXYzine, loperamide, magnesium hydroxide, methocarbamol, naproxen, ondansetron  Plan: continue current plan of care.  Mennie Spiller,MICKIE D. 07/31/2011

## 2011-07-31 NOTE — Progress Notes (Signed)
Patient ID: Fannie Gathright, female   DOB: 09-13-1965, 46 y.o.   MRN: [id number]  [facility] Group Notes:  (Counselor/Nursing/MHT/Case Management/Adjunct)  07/31/2011 1:15 PM  Type of Therapy:  Group Therapy, Dance/Movement Therapy   Participation Level:  Did Not Attend   Rhunette Croft 07/31/2011. 5:04 PM

## 2011-07-31 NOTE — ED Notes (Signed)
ZOX:[id number]<AV> Expected date:07/31/11<BR> Expected time: 8:29 AM<BR> Means of arrival:Ambulance<BR> Comments:<BR> EMS BH nausea and vomiting

## 2011-07-31 NOTE — Progress Notes (Signed)
Pt is resting quietly. No signs of distress or discomfort noted at this time.  Pt. Was having a hard time going to sleep when writer came back from admission and Clinical research associate called MD for an order>  Clinical research associate received an order for tramadol 50mg . Which has helped pt. To rest.

## 2011-07-31 NOTE — Progress Notes (Signed)
0745 D Pt observed vomiting into trash bag..leaning  Out of her bed and as she vomited, she fell onto the floor, rolling over and hitting her left side of her forehead. There is no laceration observed. Pt did not lose consciousness, pt was assisted to sit up . Pt stated she did not tell caregivers that she is withdrawing from alcohol also. Vomiting ceased and pt was assisted to get into her bed. MD notified and will transport to [facility] ED for assessment. Verbal report  Called to ED nurse and non emergency 911 called. Per pt's request, her mother was notified Regino Bellow [phone] ). PD RN [facility]
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
## 2011-08-01 LAB — URINE CULTURE

## 2011-08-01 MED ORDER — LIDOCAINE 5 % EX PTCH
1.0000 | MEDICATED_PATCH | Freq: Every day | CUTANEOUS | Status: DC
  Administered 2011-08-02 – 2011-08-03 (×2): 1 via TRANSDERMAL
  Filled 2011-08-01 (×2): qty 1
  Filled 2011-08-01: qty 14
  Filled 2011-08-01 (×2): qty 1
  Filled 2011-08-01: qty 14

## 2011-08-01 MED ORDER — HYDROXYZINE HCL 50 MG PO TABS
50.0000 mg | ORAL_TABLET | Freq: Every evening | ORAL | Status: DC | PRN
  Administered 2011-08-01 (×2): 50 mg via ORAL
  Filled 2011-08-01: qty 1
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
## 2011-08-01 NOTE — Progress Notes (Signed)
D  Alice [name] is seen out in the milieu first thing this morning. She is pleasant, articulate and cooperative. She says her legs " feel like spaghetti from that sleep medicine I took last night". She makes good eye contact. She completed her AM self inventory and on it she wrote she denied SI, she rated her depression and hopelessness" 5 / 7 " and stated her DC plan was to " try to stay clean and to go to meetings". A  She is compliant with her meds. She is attending her groups and is engaged in her POC. R  Safety is in place and therapeutic relationship is fostered. PD RN [facility]

## 2011-08-01 NOTE — Progress Notes (Signed)
Patient ID: Lulla Linville, female   DOB: 12-17-1965, 46 y.o.   MRN: [id number]   [facility] Group Notes:  (Counselor/Nursing/MHT/Case Management/Adjunct)  08/01/2011 1:15 PM  Type of Therapy:  Group Therapy, Dance/Movement Therapy   Participation Level:  Active  Participation Quality:  Appropriate  Affect:  Appropriate  Cognitive:  Appropriate   Insight:  Good  Engagement in Group:  Limited  Engagement in Therapy:  Limited  Modes of Intervention:  Clarification, Problem-solving, Role-play, Socialization and Support  Summary of Progress/Problems:  Therapist and group members discussed motivation for recovery, healthy support systems, and how to ask for help when we need it. Therapist encouraged group members to share positive statements about one another and group members practiced repeating these positive affirmations. Therapist ended group by sharing the 12 promises and encouraged group members to make their own promises and positive statements. Pt. became very upset during the beginning of the group and stepped out. Pt. clamed down and was able to return to therapy and participate in the group.     Cassidi Long 08/01/2011. 3:18 PM

## 2011-08-01 NOTE — Progress Notes (Signed)
Psychoeducational Group Note  Date:  08/01/2011 Time:  1515  Group Topic/Focus:  Conflict Resolution:   The focus of this group is to discuss the conflict resolution process and how it may be used upon discharge.  Participation Level:  Active  Participation Quality:  Appropriate  Affect:  Appropriate  Cognitive:  Appropriate  Insight:  Good  Engagement in Group:  Good  Additional Comments:  none  Brinklee Cisse, Genia Plants 08/01/2011, 4:37 PM
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
## 2011-08-01 NOTE — Progress Notes (Signed)
Alice [name] is having
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
## 2011-08-01 NOTE — Progress Notes (Signed)
Patient ID: Alice [name], female   DOB: [birth year], 46 y.o.   MRN: [id number]  After care: Pt. attended and participated in aftercare planning group. Pt. accepted information on suicide prevention, warning signs to look for with suicide and crisis line numbers to use. The pt. agreed to call crisis line numbers if having warning signs or having thoughts of suicide. Pt. listed their current anxiety level as 6 and their current depression level as 4. Pt. has been sleeping the past couple of days and had to be taken to the ED yesterday for nausea. Pt. was active and alert this morning and seemed to be feeling better.
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
## 2011-08-02 DIAGNOSIS — F10239 Alcohol dependence with withdrawal, unspecified: Secondary | ICD-10-CM

## 2011-08-02 MED ORDER — ENSURE COMPLETE PO LIQD
237.0000 mL | Freq: Three times a day (TID) | ORAL | Status: DC
  Administered 2011-08-02 – 2011-08-04 (×6): 237 mL via ORAL

## 2011-08-02 MED ORDER — LISINOPRIL 10 MG PO TABS
10.0000 mg | ORAL_TABLET | Freq: Every day | ORAL | Status: DC
  Administered 2011-08-02 – 2011-08-03 (×2): 10 mg via ORAL
  Filled 2011-08-02 (×5): qty 1

## 2011-08-02 MED ORDER — MIRTAZAPINE 15 MG PO TABS
15.0000 mg | ORAL_TABLET | Freq: Every day | ORAL | Status: DC
  Administered 2011-08-02 – 2011-08-03 (×2): 15 mg via ORAL
  Filled 2011-08-02: qty 14
  Filled 2011-08-02 (×2): qty 1
  Filled 2011-08-02: qty 14
  Filled 2011-08-02: qty 1

## 2011-08-02 MED ORDER — ESTRADIOL 1 MG PO TABS
1.0000 mg | ORAL_TABLET | Freq: Every day | ORAL | Status: DC
  Administered 2011-08-02 – 2011-08-04 (×3): 1 mg via ORAL
  Filled 2011-08-02 (×4): qty 1
  Filled 2011-08-02 (×2): qty 14

## 2011-08-02 MED FILL — Promethazine HCl Suppos 25 MG: RECTAL | Qty: 1 | Status: AC

## 2011-08-02 NOTE — Progress Notes (Signed)
Pt was in dayroom upon first assessment this morning.  Introduced self to pt and gave her self-inventory to fill out for today.  She immediately asked for ensure.  She stated, "I have an order for chocolate and that is the only one I will drink"  Attempted to explain to pt that she does not have an order at this time, but once the docs arrive this nurse would see if pt can have an order for the ensure.  She threw her paper in the floor and stated,"I have not eaten for 5 days and have been throwing everything up"  Informed pt that I would let the doctor know she is refusing to fill out her self-inventory and see if she could have the ensure.  She followed this nurse down the hallway and stated,"I am sorry I just need some help for I don't have my glasses with me"  Told pt to go back to dayroom and I would go over it with her. At that time she denied any depression or hopelessness and rated her anxiety a 10+ on her self-inventory.  She had been given 4mg  of imodium around [phone] and she stated,"I do not have any diarrhea now so give me the ensure"  She was ordered ensure 3 times/day.  She denied any symptoms of withdrawal except agitation and the diarrhea.  She reported her sleep poor along with her appetite and low energy.  She was able to eat some lunch and dinner tonight.  She has had many prn meds today.  Robaxin, zofran, maalox and vistaril which she reported relief of all her symptoms upon reassessment.  She did request a cane so she plans to have her family bring her cane from home and she now has an order to use it.  She has an new order for remeron to start tonight.  She voiced understanding.  She has requested ginger ale at least 6 times today.  She is planning to discharge either tomorrow or Wednesday.

## 2011-08-02 NOTE — Progress Notes (Signed)
Pt attended discharge planning group and actively participated in group.  SW provided pt with today's workbook.  Pt presents with irritated affect and mood.  Pt states that she came here herself to detox off of opiates.  Pt states that she has been using since 1994.  Pt states that she is still having withdrawal symptoms today, to include nausea.  Pt states that she has not eaten in 5 days.  Pt states that she is concerned about this.  Pt states that she doesn't want rehab and wants outpatient services.  Pt states that she lives in [place].  Pt denies having depression and rates anxiety at a 10 today.  Pt denies SI.  SW will assess for appropriate referrals.  No further needs voiced by pt at this time.  Safety planning and suicide prevention discussed.  Pt participated in discussion and acknowledged an understanding of the information provided.       Reyes Ivan, LCSWA 08/02/2011  10:15 AM

## 2011-08-02 NOTE — Progress Notes (Signed)
[facility] MD Progress Note  08/02/2011 3:46 PM   S/O: Patient seen and evaluated. Chart reviewed. Patient stated that her mood was "ok".  Sig opioid w/d s/s noted.  Her affect was mood congruent and anxious. She denied any current thoughts of self injurious behavior, suicidal ideation or homicidal ideation. There were no auditory or visual hallucinations, paranoia, delusional thought processes, or mania noted.  Thought process was linear and goal directed.  No psychomotor agitation or retardation was noted. Speech was normal rate, tone and volume. Eye contact was good. Judgment and insight are fair.  Patient has been up and engaged on the unit.  No acute safety concerns reported from team.    Sleep:  Number of Hours: 2.75    Vital Signs:Blood pressure 146/92, pulse 73, temperature 97.4 F (36.3 C), temperature source Oral, resp. rate 20, height 5\' 9"  (1.753 m), weight 86.637 kg (191 lb), last menstrual period 07/30/2011, SpO2 100.00%.  Current Medications:    . cloNIDine  0.1 mg Oral BH-qamhs   Followed by  . cloNIDine  0.1 mg Oral QAC breakfast  . estradiol  1 mg Oral Daily  . feeding supplement  237 mL Oral TID  . lidocaine  1 patch Transdermal Q breakfast  . lisinopril  10 mg Oral Daily  . nicotine  21 mg Transdermal Q0600    Lab Results: No results found for this or any previous visit (from the past 48 hour(s)).  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  Reviewed short term and long term goals, medications, current treatment in the hospital and acute/chronic safety.  Pt denied any current thoughts of self harm, suicidal ideation or homicidal ideation.  Contracted for safety on the unit.  No acute issues noted.  VS reviewed with team.  Pt agreeable with treatment plan, see orders. Continue current  medications as noted above.  Lisinopril clarified and restarted today.  Will continue standard taper and prns for opioid w/d.  Ensure ordered per pt request in light of potential N/V/dehydration.  Pt reportedly not agreeable to trazodone for sleep, will try Remeron tonight.  Lupe Carney 08/02/2011, 3:46 PM

## 2011-08-02 NOTE — Progress Notes (Signed)
Pt with numerous somatic complaints tonight. States that phenergan suppository she received around 1830 was expelled within minutes though pt did not save in toilet. Continues to state she has nausea and vomiting but no vomiting or emesis observed (as requested by staff). Was able to tolerate ginger ale and crackers. Also tolerated a 1/2 cup of Ensure. Throughout the night pt has complained of consistent pain, restlessness, anxiety, weakness, gas and insomnia. Medicated with prn's throughout the night (see MAR) with short periods of sleep. Allowed to soak in tub just now but pt states her main concern is weakness and inability to eat.  Did tolerate vistaril much better than trazadone. No SI/HI/AVH. COWS is a "5" with elevated BP. Continue to monitor closely. Lawrence Marseilles

## 2011-08-02 NOTE — Tx Team (Signed)
Interdisciplinary Treatment Plan Update (Adult)  Date:  08/02/2011  Time Reviewed:  10:30 AM   Progress in Treatment: Attending groups: Yes Participating in groups:  Yes Taking medication as prescribed: Yes Tolerating medication:  Yes Family/Significant othe contact made:  Yes, Contact made with boyfriend Patient understands diagnosis:  Yes Discussing patient identified problems/goals with staff:  Yes Medical problems stabilized or resolved:  Yes Denies suicidal/homicidal ideation: Yes Issues/concerns per patient self-inventory:  None identified Other: N/A  New problem(s) identified: None Identified  Reason for Continuation of Hospitalization: Anxiety Depression Medication stabilization Withdrawal Symptoms  Interventions implemented related to continuation of hospitalization: mood stabilization, medication monitoring and adjustment, group therapy and psycho education, safety checks q 15 mins  Additional comments: N/A  Estimated length of stay: 3-5 days  Discharge Plan: SW will make appropriate referrals.  New goal(s): N/A  Review of initial/current patient goals per problem list:    1.  Goal(s): Address substance use  Met:  No  Target date: by discharge  As evidenced by: completing detox protocol and refer to appropriate treatment  2.  Goal (s): Reduce depressive and anxiety symptoms  Met:  No  Target date: by discharge  As evidenced by: Reducing depression from a 10 to a 3 as reported by pt.  Pt rates anxiety at a 10 today.    Attendees: Patient:  Alice [name] 08/02/2011 10:32 AM   Family:     Physician:  Lupe Carney, DO 08/02/2011 10:30 AM   Nursing: Robbie Louis, RN 08/02/2011 10:30 AM   Case Manager:  Reyes Ivan, LCSWA 08/02/2011  10:30 AM   Counselor:  Ronda Fairly, LCSWA 08/02/2011  10:30 AM   Other:  Richelle Ito, LCSW 08/02/2011 10:30 AM   Other:  Chinita Greenland, RN 08/02/2011 10:32 AM  Other:     Other:      Scribe for Treatment Team:     Reyes Ivan 08/02/2011 10:30 AM

## 2011-08-02 NOTE — Progress Notes (Addendum)
[facility] Group Notes:  (Counselor/Nursing/MHT/Case Management/Adjunct)  08/02/2011 3:26 PM  Type of Therapy:  Group Therapy from 1:15 to 2:30  Participation Level:  Active  Participation Quality:  Attentive, Intrusive and Sharing  Affect:  Irritable  Cognitive:  Alert and Oriented  Insight:  Limited  Engagement in Group:  Limited  Engagement in Therapy:  Limited  Modes of Intervention:  Clarification, Limit-setting, Orientation and Support  Summary of Progress/Problems:  Group discussion focused on what patient's see as their own obstacles to recovery.  Patient shares belief that "nothing will be difficult to deal with due to my desire for all to go well."  Anahita was in and out of group room in order to support another patient who asked for her stating she had an "emergency."  Adelle involved herself in multiple side conversations and multiple side comments.  Emmry asked multiple questions "Why do ya'll always do that, shoot holes in someone's plan? What you said in that treatment team meeting made me mad; what makes you think I need to stay just because I can't sleep on these awful beds?" Clide Dales 08/02/2011, 3:33 PM

## 2011-08-02 NOTE — Progress Notes (Signed)
[facility] Adult Inpatient Family/Significant Other Suicide Prevention Education  Suicide Prevention Education:  Education Completed; Lamount Cranker (boyfriend) [phone] has been identified by the patient as the family member/significant other with whom the patient will be residing, and identified as the person(s) who will aid the patient in the event of a mental health crisis (suicidal ideations/suicide attempt).  With written consent from the patient, the family member/significant other has been provided the following suicide prevention education, prior to the and/or following the discharge of the patient.  The suicide prevention education provided includes the following:  Suicide risk factors  Suicide prevention and interventions  National Suicide Hotline telephone number  [facility] assessment telephone number  [facility] Emergency Assistance 911  [facility] and/or Residential Mobile Crisis Unit telephone number  Request made of family/significant other to:  Remove weapons (e.g., guns, rifles, knives), all items previously/currently identified as safety concern.    Remove drugs/medications (over-the-counter, prescriptions, illicit drugs), all items previously/currently identified as a safety concern.  The family member/significant other verbalizes understanding of the suicide prevention education information provided.  The family member/significant other agrees to remove the items of safety concern listed above.  BF is worried that pt is abusing her pain medications for the pain clinic, seeing her run out to soon Pt can come home with BF no safety concerns BF is willing to and plans to manage all pt.'s medications at D/C  [facility] 08/02/2011, 9:16 AM

## 2011-08-03 MED ORDER — LISINOPRIL 5 MG PO TABS
15.0000 mg | ORAL_TABLET | Freq: Every day | ORAL | Status: DC
  Administered 2011-08-04: 15 mg via ORAL
  Filled 2011-08-03 (×3): qty 1

## 2011-08-03 MED ORDER — CEFUROXIME AXETIL 250 MG PO TABS
250.0000 mg | ORAL_TABLET | Freq: Once | ORAL | Status: AC
  Administered 2011-08-03: 250 mg via ORAL
  Filled 2011-08-03: qty 1

## 2011-08-03 MED ORDER — CEFUROXIME AXETIL 250 MG PO TABS
250.0000 mg | ORAL_TABLET | Freq: Two times a day (BID) | ORAL | Status: DC
  Filled 2011-08-03 (×2): qty 1

## 2011-08-03 MED ORDER — LISINOPRIL 10 MG PO TABS
10.0000 mg | ORAL_TABLET | Freq: Once | ORAL | Status: AC
  Administered 2011-08-03: 10 mg via ORAL
  Filled 2011-08-03: qty 1

## 2011-08-03 MED ORDER — CEFUROXIME AXETIL 500 MG PO TABS
250.0000 mg | ORAL_TABLET | Freq: Two times a day (BID) | ORAL | Status: DC
  Administered 2011-08-03 – 2011-08-04 (×3): 250 mg via ORAL
  Filled 2011-08-03: qty 9
  Filled 2011-08-03 (×2): qty 1
  Filled 2011-08-03: qty 9
  Filled 2011-08-03: qty 1
  Filled 2011-08-03 (×2): qty 9
  Filled 2011-08-03: qty 1

## 2011-08-03 NOTE — Progress Notes (Signed)
[facility] Group Notes:  (Counselor/Nursing/MHT/Case Management/Adjunct)  08/03/2011 11:53 AM  Type of Therapy:  Psychoeducational Skills  Participation Level:  Active  Participation Quality:  Inattentive and Resistant  Affect:  Irritable  Cognitive:  Appropriate and Oriented  Insight:  Limited  Engagement in Group:  Limited  Engagement in Therapy:  n/a  Modes of Intervention:  Activity, Education, Limit-setting, Problem-solving, Socialization and Support  Summary of Progress/Problems:  Lauralyn attended but frequently came in our and out of Psychoeducational group that focused on using quality time with support systems/individuals to engage in health coping skills. Avila participated in activity guessing about self and peers. Britnee was agitated, negative, and engaging in side talk with peer while group discussed who their support systems are, how they can spend positive quality time with them as a coping skill and a way to strengthen the relationship. Eleyna was given a homework assignment to find two ways to improve her support systems and twenty activities she can do spend quality time with her supports.    Wandra Scot 08/03/2011, 11:53 AM

## 2011-08-03 NOTE — Progress Notes (Signed)
[facility] Group Notes:  (Counselor/Nursing/MHT/Case Management/Adjunct)  08/03/2011 3:32 PM  Type of Therapy:  Psychoeducational Skills  Participation Level:  Active  Participation Quality:  Appropriate  Affect:  Appropriate  Cognitive:  Appropriate  Insight:  Good  Engagement in Group:  Good  Engagement in Therapy:  Good  Modes of Intervention:  Support  Summary of Progress/Problems:   Alice [name] 08/03/2011, 3:32 PM

## 2011-08-03 NOTE — Progress Notes (Signed)
Patient ID: Alice [name], female   DOB: 01-05-1966, 46 y.o.   MRN: [id number] D: Pt. Says "well at least I ain't throwing up and puking today", but my stomach's hurting" "I'm ready to go home" Pt. Reports anxiety at "10". C/o of constipation.  Pt. Denies SHI. Staff will monitor for safety q55min. A: Writer reviewed meds offered pt. Meds, but refused until bedtime. R: Pt. Remains safe on the unit. Writer encouraged pt. To speak with doctor if Mylanta doesn't bring relief.

## 2011-08-03 NOTE — Progress Notes (Signed)
Pt has been up for most groups today but still was going in and out during the groups.  She rated all her depression and hopelessness a 1 on her self-inventory.  She rated her anxiety a 3 but stated,"It is only because I want to go home".  She has remained loud and intrusive on the unit.  She has been yelling and cursing and finally by the afternoon she was seemed to be more in control of her feelings as the day progressed.  She was started on antibiotic for UTI and will have VS taken q 4 hours.  She has an order to increase her lisinopril to 15 mg starting tomorrow.  Pt still causing problems with peers and staff by trying to split the nurses, techs and her peers.  She has come to nurses station several times today to report something that either a staff member or peers have done.  Pt can be redirected much better this afternoon than this morning.

## 2011-08-03 NOTE — [facility] Suicide Risk Assessment (Signed)
Suicide Risk Assessment  Discharge Assessment      Demographic factors: See chart.  Current Mental Status Per Nursing Assessment: At Discharge: Pt denied any SI/HI/thoughts of self harm or acute psychiatric issues.   Current Mental Status Per Physician: Patient seen and evaluated. Chart reviewed. Patient stated that her mood was "better".  Requesting discharge.  W/d s/s improved. Her affect was mood congruent and less anxious. She denied any current thoughts of self injurious behavior, suicidal ideation or homicidal ideation. There were no auditory or visual hallucinations, paranoia, delusional thought processes, or mania noted. Thought process was linear and goal directed. No psychomotor agitation or retardation was noted. Speech was normal rate, tone and volume. Eye contact was good. Judgment and insight are fair. Patient has been up and engaged on the unit. No acute safety concerns reported from team.   Historical Factors:  chronic back pain; supportive relationship  Risk Reduction Factors:  patient presented to the ED wanting to come off her pain medications as stated that she was tired of seeking drugs on the street; motivated to stay clean  Discharge Diagnoses: Opioid Use Disorder; Alcohol Use Disorder; Cocaine Use Disorder, in reported remission; HTN; Insomnia; UTI   Past Medical History  Diagnosis Date  . Anemia   . Heart murmur     Cognitive Features That Contribute To Risk: limited insight; impulsivity.  Suicide Risk: Pt viewed as a chronic increased risk of harm to self in light of her past hx and risk factors.  No acute safety concerns on the unit.  Pt contracting for safety and requesting discharge.  VS:  Filed Vitals:   08/03/11 1201  BP: 153/112  Pulse: 80  Temp:   Resp:     Plan Of Care/Follow-up recommendations: Initiated Tx UTI today s/p consultation with [facility] Pharmacist in light of pt's allergies.  No SEs reported.  Increased Lisinopril for continued HTN and d/c  planned for am s/p further f/u and VS q4hrs. Pt stable for and requesting discharge. Pt contracting for safety and does not currently meet [place] involuntary commitment criteria for continued hospitalization against her will.  Mental health treatment, medication management and continued sobriety will mitigate against the increased risk of harm to self and/or others.  Discussed the importance of recovery further with pt, as well as, tools to move forward in a healthy & safe manner.  Pt agreeable with the plan.  Discussed with the team.  Please see orders, follow up appointments per AVS and full discharge summary to be completed by physician extender.  Recommend follow up with AA/NA.  Diet: Heart Healthy.  Activity: As tolerated.  Pt agreeable with plan.      Lupe Carney 08/03/2011, 3:57 PM

## 2011-08-03 NOTE — Progress Notes (Signed)
[facility] Group Notes:  (Counselor/Nursing/MHT/Case Management/Adjunct)  08/03/2011 5:57 PM  Type of Therapy:  Group Therapy from 1:15 to 2:30  Participation Level:  Active  Participation Quality:  Attentive, Intrusive and Sharing  Affect:  Excited  Cognitive:  Alert and Oriented  Insight:  Limited  Engagement in Group:  Good  Engagement in Therapy:  Limited  Modes of Intervention:  Education, Socialization and Support  Summary of Progress/Problems: Patient attended group presentation by Interior and spatial designer of  [employer] ([employer]). Margorie was attentive and appropriate during session yet intruded with multiple unrelated questions.  For the remainder of time croup discussed ASAM's definition of addiction ; Martin agreed that it affects all areas of one's life but wondered aloud if using the word "disease" is a cop out which led to good discussion.   Clide Dales 08/03/2011 6:01 PM

## 2011-08-03 NOTE — Progress Notes (Signed)
Pt attended discharge planning group and actively participated in group.  SW provided pt with today's workbook.  Pt presents with anxious mood and affect.  Pt denies having depression and SI and rates anxiety at a 10 today.  Pt reports feeling stable to d/c.  Pt was agitated, stating she doesn't get along with the nurse and has a UTI that hasn't been treated for 5 days.  Pt states that she is still sick and wants to d/c to take care of her illnesses at home.  Pt will follow up with PCP and NA/AA meetings.  Pt states that her sister is recovering and has been clean for 5 years.  Pt states that she plans to use her sister and fiance for support.  No further needs voiced by pt at this time.    Reyes Ivan, LCSWA 08/03/2011  10:18 AM

## 2011-08-03 NOTE — Progress Notes (Signed)
D: Pt in bed resting with eyes closed. Respirations even and unlabored. Pt appears to be in no signs of distress at this time. A: Q15min checks remains for this pt. R: Pt remains safe at this time.   

## 2011-08-03 NOTE — Progress Notes (Signed)
08/03/2011      Time: 1500      Group Topic/Focus: The focus of this group is on discussing various styles of communication and communicating assertively using 'I' (feeling) statements.  Participation Level: Active  Participation Quality: Attentive  Affect: Irritable  Cognitive: Oriented   Additional Comments: Patient irritable with peers, encouraging them often to focus on the activity.   Latasia Silberstein 08/03/2011 3:44 PM

## 2011-08-04 MED ORDER — HYDROXYZINE HCL 50 MG PO TABS
50.0000 mg | ORAL_TABLET | Freq: Once | ORAL | Status: AC
  Administered 2011-08-04: 50 mg via ORAL
  Filled 2011-08-04: qty 1

## 2011-08-04 MED ORDER — LISINOPRIL 10 MG PO TABS
15.0000 mg | ORAL_TABLET | Freq: Every day | ORAL | Status: DC
  Filled 2011-08-04 (×2): qty 21
  Filled 2011-08-04: qty 42

## 2011-08-04 MED ORDER — HYDROCHLOROTHIAZIDE 25 MG PO TABS: 25.0000 mg | ORAL_TABLET | Freq: Once | ORAL | Status: DC

## 2011-08-04 MED ORDER — CEFUROXIME AXETIL 250 MG PO TABS: 250.0000 mg | ORAL_TABLET | Freq: Two times a day (BID) | ORAL | Status: AC

## 2011-08-04 MED ORDER — LIDOCAINE 5 % EX PTCH: 1.0000 | MEDICATED_PATCH | Freq: Every day | CUTANEOUS | Status: AC

## 2011-08-04 MED ORDER — METOCLOPRAMIDE HCL 10 MG PO TABS: 10.0000 mg | ORAL_TABLET | Freq: Four times a day (QID) | ORAL | Status: DC

## 2011-08-04 MED ORDER — MIRTAZAPINE 15 MG PO TABS: 15.0000 mg | ORAL_TABLET | Freq: Every day | ORAL | Status: DC

## 2011-08-04 MED ORDER — LISINOPRIL 5 MG PO TABS: 15.0000 mg | ORAL_TABLET | Freq: Every day | ORAL | Status: DC

## 2011-08-04 MED ORDER — ESTRADIOL 1 MG PO TABS: 1.0000 mg | ORAL_TABLET | Freq: Every day | ORAL | Status: DC

## 2011-08-04 MED ORDER — ONDANSETRON HCL 4 MG PO TABS
4.0000 mg | ORAL_TABLET | Freq: Four times a day (QID) | ORAL | Status: DC | PRN
  Administered 2011-08-04: 4 mg via ORAL

## 2011-08-04 NOTE — Discharge Summary (Signed)
Read and reviewed.  See SRA.

## 2011-08-04 NOTE — Progress Notes (Signed)
Patient ID: Alice [name], female   DOB: 1965/09/09, 46 y.o.   MRN: [id number]   D: Patient anxious about discharge today but feels ready to go per patient. Denies any depression or SI today. Denies any feelings of hopelessness. States that she wants to stay clean.  A: Staff reviewed discharge with patient. Patient met with treatment team this am and they felt patient ready for discharge.  R: Obtained all belongings, prescriptions, sample meds, and follow-up appointment.

## 2011-08-04 NOTE — ED Provider Notes (Signed)
Medical screening examination/treatment/procedure(s) were performed by non-physician practitioner and as supervising physician I was immediately available for consultation/collaboration.  Van Seymore, MD 08/04/11 1839 

## 2011-08-04 NOTE — Progress Notes (Signed)
[facility] Case Management Discharge Plan:  Will you be returning to the same living situation after discharge: Yes,  return home At discharge, do you have transportation home?:Yes,  fiance to pick pt up Do you have the ability to pay for your medications:Yes,  access to meds  Interagency Information:     Release of information consent forms completed and in the chart;  Patient's signature needed at discharge.  Patient to Follow up at:  Follow-up Information    Follow up with AA/NA meetings. (See brochure for meeting times and locations)       Follow up with Dr. Joette Catching on 08/11/2011. (Appointment scheduled at 11:15 am)    Contact information:   [address] [place], [place] [address] [phone]         Patient denies SI/HI:   Yes,  denies SI/HI    Safety Planning and Suicide Prevention discussed:  Yes,  discussed with pt today  Barrier to discharge identified:No.  Summary and Recommendations: Pt attended discharge planning group and actively participated in group.  SW provided pt with today's workbook.  Pt presents with irritated affect and mood.  Pt denies having depression or SI but reports high anxiety.  Pt is irritated about her care while here.  Pt reports feeling stable to d/c.  No recommendations from SW.  No further needs voiced by pt.  Pt stable to discharge.     Carmina Miller 08/04/2011, 9:30 AM

## 2011-08-04 NOTE — Discharge Summary (Signed)
Physician Discharge Summary Note  Patient:  Alice [name] is an 46 y.o., female MRN:  [id number] DOB:  10-29-1965 Patient phone:  [phone] (home)  Patient address:   [address] [place] [address]   Date of Admission:  07/30/2011 Date of Discharge: 08/04/2011   Discharge Diagnoses: Principal Problem:  *Opiate dependence  Axis Diagnosis:  Discharge Diagnoses: Opioid Use Disorder; Alcohol Use Disorder; Cocaine Use Disorder, in reported remission; HTN; Insomnia; UTI   Level of Care:  OP  Hospital Course:    Maysel was admitted for detox from opiates, and crisis management.  She was treated with the standard Clonodin protocol.  Medical problems were identified and treated.  Home medication was restarted as appropriate.     Improvement was monitored by COWS scores and patient's daily report of withdrawal symptom reduction. Emotional and mental status was monitored by daily self inventory reports completed by the patient and clinical staff.      The patient was evaluated by the treatment team for stability and plans for continued recovery upon discharge. He/She was offered further treatment options upon discharge including Residential, IOP, and Outpatient treatment.  The patient's motivation was an integral factor for scheduling further treatment.  Employment, transportation, bed availability, health status, family support, and any pending legal issues were also considered.    Upon completion of detox the patient was both mentally and medically stable for discharge.    Follow-up Information    Follow up with AA/NA meetings. (See brochure for meeting times and locations)       Follow up with Dr. Joette Catching on 08/11/2011. (Appointment scheduled at 11:15 am)    Contact information:   [address] [place], [place] [address] [phone]         Upon discharge, patient adamantly denies suicidal, homicidal ideations, auditory, visual hallucinations and or delusional thinking. They left  [facility] with all personal belongings via private transportation in no apparent distress.  Consults:  None  Significant Diagnostic Studies:  None and labs: Urinalysis  Discharge Vitals:   Blood pressure 138/94, pulse 92, temperature 99 F (37.2 C), temperature source Oral, resp. rate 20, height 5\' 9"  (1.753 m), weight 86.637 kg (191 lb), last menstrual period 07/30/2011, SpO2 100.00%..  Mental Status Exam: See Mental Status Examination and Suicide Risk Assessment completed by Attending Physician prior to discharge.  Discharge destination:  Home  Is patient on multiple antipsychotic therapies at discharge:  No  Has Patient had three or more failed trials of antipsychotic monotherapy by history: N/A Recommended Plan for Multiple Antipsychotic Therapies: N/A Discharge Orders    Future Orders Please Complete By Expires   Diet - low sodium heart healthy      Increase activity slowly      Discharge instructions      Comments:   Take all of your medications as prescribed.  Be sure to keep ALL follow up appointments as scheduled. This is to ensure getting your refills on time to avoid any interruption in your medication.  If you find that you can not keep your appointment, call the clinic and reschedule. Be sure to tell the nurse if you will need a refill before your appointment.     Medication List  As of 08/04/2011  9:50 AM   STOP taking these medications         amoxicillin 875 MG tablet      multivitamin with minerals Tabs      oxyCODONE-acetaminophen 10-325 MG per tablet  oxymorphone 15 MG 12 hr tablet      oxymorphone 30 MG 12 hr tablet      traMADol 50 MG tablet         TAKE these medications      Indication    cefUROXime 250 MG tablet   Commonly known as: CEFTIN   Take 1 tablet (250 mg total) by mouth 2 (two) times daily with a meal. For infection.  Please give the patient the remainder of the course of treatment.       estradiol 1 MG tablet   Commonly known as:  ESTRACE   Take 1 tablet (1 mg total) by mouth daily. For Hormone replacement.    Indication: Deficiency of the Hormone Estrogen      lidocaine 5 %   Commonly known as: LIDODERM   Place 1 patch onto the skin daily with breakfast. Remove & Discard patch within 12 hours or as directed by MD for pain.       lisinopril 5 MG tablet   Commonly known as: PRINIVIL,ZESTRIL   Take 3 tablets (15 mg total) by mouth daily. For high blood pressure.    Indication: High Blood Pressure      metoCLOPramide 10 MG tablet   Commonly known as: REGLAN   Take 1 tablet (10 mg total) by mouth every 6 (six) hours. For acid reflux.    Indication: Nausea and Vomiting      mirtazapine 15 MG tablet   Commonly known as: REMERON   Take 1 tablet (15 mg total) by mouth at bedtime. For insomnia.    Indication: Trouble Sleeping           Follow-up Information    Follow up with AA/NA meetings. (See brochure for meeting times and locations)       Follow up with Dr. Joette Catching on 08/11/2011. (Appointment scheduled at 11:15 am)    Contact information:   [address] [place], [place] [address] [phone]        Follow-up recommendations:   Activities: Resume typical activities Diet: Resume typical diet Other: Follow up with outpatient provider and report any side effects to out patient prescriber.  Comments:  Take all your medications as prescribed by your mental healthcare provider. Report any adverse effects and or reactions from your medicines to your outpatient provider promptly. Patient is instructed and cautioned to not engage in alcohol and or illegal drug use while on prescription medicines. In the event of worsening symptoms, patient is instructed to call the crisis hotline, 911 and or go to the nearest ED for appropriate evaluation and treatment of symptoms.  Signed: Rona Ravens. Faige Seely PAC For Dr. Lupe Carney 08/04/2011 9:50 AM

## 2011-08-04 NOTE — Tx Team (Signed)
Interdisciplinary Treatment Plan Update (Adult)  Date:  08/04/2011  Time Reviewed:  9:48 AM   Progress in Treatment: Attending groups: Yes Participating in groups:  Yes Taking medication as prescribed: Yes Tolerating medication:  Yes Family/Significant othe contact made: Yes, contact made with boyfriend Patient understands diagnosis:  Yes Discussing patient identified problems/goals with staff:  Yes Medical problems stabilized or resolved:  Yes Denies suicidal/homicidal ideation: Yes Issues/concerns per patient self-inventory:  None identified Other: N/A  New problem(s) identified: None Identified  Reason for Continuation of Hospitalization: Stable to d/c  Interventions implemented related to continuation of hospitalization: Stable to d/c  Additional comments: N/A  Estimated length of stay: D/C today  Discharge Plan: Pt will follow up with PCP for medication management and AA/NA meetings.   New goal(s): N/A  Review of initial/current patient goals per problem list:    1. Goal(s): Address substance use  Met: Yes  Target date: by discharge  As evidenced by: completed detox protocol and referred to appropriate    2. Goal (s): Reduce depressive and anxiety symptoms  Met: Yes  Target date: by discharge  As evidenced by: Reducing depression from a 10 to a 3 as reported by pt. Pt denies having depression and rates anxiety at a 10 today but reports feeling stable to d/c today.   Attendees: Patient:  Alice [name] 08/04/2011 9:48 AM   Family:     Physician:  Lupe Carney, DO 08/04/2011 9:48 AM   Nursing: Chinita Greenland, RN 08/04/2011 9:48 AM   Case Manager:  Reyes Ivan, LCSWA 08/04/2011 9:48 AM   Counselor:  Ronda Fairly, LCSWA 08/04/2011 9:48 AM   Other:  Richelle Ito, LCSW  08/04/2011 9:48 AM   Other:  Roswell Miners, RN 08/04/2011 9:48 AM   Other:  Felix Pacini D intern 08/04/2011 9:51 AM   Other:      Scribe for Treatment Team:   Reyes Ivan 08/04/2011  9:48 AM

## 2011-08-04 NOTE — ED Provider Notes (Signed)
Medical screening examination/treatment/procedure(s) were performed by non-physician practitioner and as supervising physician I was immediately available for consultation/collaboration.  Derwood Kaplan, MD 08/04/11 [phone]

## 2011-08-05 NOTE — Progress Notes (Signed)
Patient Discharge Instructions:  After Visit Summary (AVS):   Faxed to:  08/05/2011 Psychiatric Admission Assessment Note:   Faxed to:  08/05/2011 Suicide Risk Assessment - Discharge Assessment:   Faxed to:  08/05/2011 Faxed/Sent to the Next Level Care provider:  08/05/2011  Faxed to Dr. Lysbeth Galas @ [phone]  Heloise Purpura Eduard Clos, 08/05/2011, 5:02 PM

## 2011-08-12 NOTE — ED Provider Notes (Signed)
Medical screening examination/treatment/procedure(s) were performed by non-physician practitioner and as supervising physician I was immediately available for consultation/collaboration.   Gerhard Munch, MD 08/12/11 [phone]

## 2012-06-07 ENCOUNTER — Ambulatory Visit: Payer: Medicaid Other | Admitting: Cardiology

## 2012-06-09 ENCOUNTER — Encounter: Payer: Self-pay | Admitting: Cardiology

## 2012-06-17 DIAGNOSIS — D649 Anemia, unspecified: Secondary | ICD-10-CM | POA: Insufficient documentation

## 2012-06-17 DIAGNOSIS — G8929 Other chronic pain: Secondary | ICD-10-CM | POA: Insufficient documentation

## 2012-06-17 DIAGNOSIS — M461 Sacroiliitis, not elsewhere classified: Secondary | ICD-10-CM | POA: Insufficient documentation

## 2012-06-17 DIAGNOSIS — F32A Depression, unspecified: Secondary | ICD-10-CM | POA: Insufficient documentation

## 2012-06-17 DIAGNOSIS — M79609 Pain in unspecified limb: Secondary | ICD-10-CM | POA: Insufficient documentation

## 2012-06-17 DIAGNOSIS — T50905A Adverse effect of unspecified drugs, medicaments and biological substances, initial encounter: Secondary | ICD-10-CM | POA: Insufficient documentation

## 2012-06-17 DIAGNOSIS — IMO0001 Reserved for inherently not codable concepts without codable children: Secondary | ICD-10-CM | POA: Insufficient documentation

## 2012-06-17 DIAGNOSIS — M5137 Other intervertebral disc degeneration, lumbosacral region: Secondary | ICD-10-CM | POA: Insufficient documentation

## 2012-06-17 DIAGNOSIS — IMO0002 Reserved for concepts with insufficient information to code with codable children: Secondary | ICD-10-CM | POA: Insufficient documentation

## 2012-06-17 DIAGNOSIS — F519 Sleep disorder not due to a substance or known physiological condition, unspecified: Secondary | ICD-10-CM

## 2012-06-17 DIAGNOSIS — G4700 Insomnia, unspecified: Secondary | ICD-10-CM | POA: Insufficient documentation

## 2012-07-06 ENCOUNTER — Encounter: Payer: Self-pay | Admitting: Cardiology

## 2012-10-04 ENCOUNTER — Ambulatory Visit: Payer: Medicaid Other | Admitting: Cardiology

## 2012-11-01 ENCOUNTER — Ambulatory Visit (INDEPENDENT_AMBULATORY_CARE_PROVIDER_SITE_OTHER): Payer: Medicaid Other | Admitting: Cardiology

## 2012-11-01 ENCOUNTER — Encounter: Payer: Self-pay | Admitting: Cardiology

## 2012-11-01 VITALS — BP 121/88 | HR 90 | Ht 72.0 in | Wt 201.0 lb

## 2012-11-01 DIAGNOSIS — R002 Palpitations: Secondary | ICD-10-CM

## 2012-11-01 NOTE — Progress Notes (Signed)
HPI The patient presents for evaluation of chest pain I have seen the patient in the past with chest pain and palpitations.   She had a stress perfusion study in April of 2000 and flow. I reviewed this. This was for evaluation of chest discomfort that was normal without evidence of ischemia or infarct. She comes back for evaluation of chest discomfort. She said this actually happened a couple of months ago. She called 911 but didn't want transport. She did see Dr. Lysbeth Galas.  It was thought that she might have had a panic attack.  Since that time she denies any further chest discomfort, neck or arm discomfort. She's had no new palpitations, presyncope or syncope. She denies any PND or orthopnea. Unfortunately recently somewhat acutely she developed left followed by right knee pain and today is having pain in both of her wrists. She is actually tearful about this. She has some tenderness to palpation. 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  She denies any fever or chills.  She has had no cough or rash.    Allergies  Allergen Reactions  . Amoxicillin-Pot Clavulanate   . Ciprofloxacin   . Diphenhydramine Hcl   . Sulfonamide Derivatives   . Tetracycline     Current Outpatient Prescriptions  Medication Sig Dispense Refill  . estradiol (ESTRACE) 1 MG tablet Take 1 tablet (1 mg total) by mouth daily. For Hormone replacement.      . fentaNYL (DURAGESIC - DOSED MCG/HR) 25 MCG/HR patch Place 1 patch onto the skin every 3 (three) days.      Marland Kitchen lisinopril (PRINIVIL,ZESTRIL) 5 MG tablet Take 3 tablets (15 mg total) by mouth daily. For high blood pressure.  90 tablet  0  . traMADol (ULTRAM) 50 MG tablet Take 50 mg by mouth every 6 (six) hours as needed for pain.       No current facility-administered medications for this visit.    Past Medical History  Diagnosis Date  . Anemia   . Heart murmur     Past Surgical History  Procedure  Laterality Date  . Back surgery      hardware in back  . Abdominal hysterectomy      abd?  . Tumor reomoved from stomach    . Cyst removed from ovary    . Removal of bb in finger  2008    ROS:  As stated in the HPI and negative for all other systems.  PHYSICAL EXAM BP 121/88  Pulse 90  Ht 6' (1.829 m)  Wt 201 lb (91.173 kg)  BMI 27.25 kg/m2  LMP 07/30/2011 GENERAL:  Well appearing HEENT:  Pupils equal round and reactive, fundi not visualized, oral mucosa unremarkable NECK:  No jugular venous distention, waveform within normal limits, carotid upstroke brisk and symmetric, no bruits, no thyromegaly LYMPHATICS:  No cervical, inguinal adenopathy LUNGS:  Clear to auscultation bilaterally BACK:  No CVA tenderness CHEST:  Unremarkable HEART:  PMI not displaced or sustained,S1 and S2 within normal limits, no S3, no S4, no clicks, no rubs, no murmurs ABD:  Flat, positive bowel sounds normal in frequency in pitch, no bruits, no rebound, no guarding, no midline pulsatile mass, no hepatomegaly, no splenomegaly EXT:  2 plus pulses throughout, no edema, no cyanosis no clubbing, questionable mild left knee effusion with positive tenderness to palpation.  SKIN:  No rashes no nodules NEURO:  Cranial nerves II through XII grossly intact, motor grossly intact  throughout [facility]:  Cognitively intact, oriented to person place and time, tearful from pain.   EKG:  Sinus rhythm, rate 92, axis within normal limits, intervals within normal limits, no acute ST-T wave changes.11/01/2012   ASSESSMENT AND PLAN  CHEST PAIN:  Given the fact that this is no longer happening I don't think that further cardiovascular testing is necessary. She certainly needs primary risk reduction and we discussed this.  TOBACCO ABUSE:  She is using electronic cigarettes. He understands the need to quit smoking completely and to not be predicted to the electronic alternative. She understands left arm cigarettes are not endorsed  by the FDA as a smoking cessation.    JOINT PAIN:  I have asked her to get an appt with Dr. Lysbeth Galas soon to discuss this acute complaint.   HTN:  The blood pressure is at target. No change in medications is indicated. We will continue with therapeutic lifestyle changes (TLC).

## 2012-11-01 NOTE — Patient Instructions (Signed)
The current medical regimen is effective;  continue present plan and medications.  Follow up as needed 

## 2012-11-10 ENCOUNTER — Ambulatory Visit: Payer: Self-pay | Admitting: Family Medicine

## 2012-11-22 ENCOUNTER — Ambulatory Visit: Payer: Self-pay | Admitting: Family Medicine

## 2012-12-04 DIAGNOSIS — M961 Postlaminectomy syndrome, not elsewhere classified: Secondary | ICD-10-CM | POA: Insufficient documentation

## 2012-12-12 ENCOUNTER — Ambulatory Visit: Payer: Self-pay | Admitting: Family Medicine

## 2013-01-03 ENCOUNTER — Ambulatory Visit: Payer: Self-pay | Admitting: Family Medicine

## 2013-01-16 ENCOUNTER — Ambulatory Visit: Payer: Self-pay | Admitting: Family Medicine

## 2013-04-03 ENCOUNTER — Telehealth: Payer: Self-pay | Admitting: Family Medicine

## 2013-04-03 NOTE — Telephone Encounter (Signed)
appt with newton on 3/25

## 2013-04-04 ENCOUNTER — Ambulatory Visit (INDEPENDENT_AMBULATORY_CARE_PROVIDER_SITE_OTHER): Payer: Medicaid Other | Admitting: Family Medicine

## 2013-04-04 ENCOUNTER — Encounter: Payer: Self-pay | Admitting: Family Medicine

## 2013-04-04 ENCOUNTER — Ambulatory Visit (INDEPENDENT_AMBULATORY_CARE_PROVIDER_SITE_OTHER): Payer: Medicaid Other

## 2013-04-04 ENCOUNTER — Encounter (INDEPENDENT_AMBULATORY_CARE_PROVIDER_SITE_OTHER): Payer: Self-pay

## 2013-04-04 VITALS — BP 139/80 | HR 68 | Temp 97.3°F | Ht 72.0 in | Wt 195.0 lb

## 2013-04-04 DIAGNOSIS — M542 Cervicalgia: Secondary | ICD-10-CM

## 2013-04-04 DIAGNOSIS — M25519 Pain in unspecified shoulder: Secondary | ICD-10-CM

## 2013-04-04 DIAGNOSIS — G8929 Other chronic pain: Secondary | ICD-10-CM

## 2013-04-04 DIAGNOSIS — S161XXA Strain of muscle, fascia and tendon at neck level, initial encounter: Secondary | ICD-10-CM

## 2013-04-04 MED ORDER — CYCLOBENZAPRINE HCL 10 MG PO TABS: 10.0000 mg | ORAL_TABLET | Freq: Three times a day (TID) | ORAL | Status: DC | PRN

## 2013-04-04 MED ORDER — MELOXICAM 15 MG PO TABS: 15.0000 mg | ORAL_TABLET | Freq: Every day | ORAL | Status: DC

## 2013-04-04 NOTE — Progress Notes (Signed)
New Patient History and Physical  Patient name: Alice PereyraDoris Headlee Medical record number: [id number] Date of birth: [birth year] Age: 48 y.o. Gender: female  Primary Care Provider: Rudi HeapMOORE, DONALD, MD  Chief Complaint: neck and shoulder pain  History of Present Illness:This is a 48 y.o. year old female with prior hx/o lumbar DJD on chronic pain medication followed in [place] presenting with neck and L shoulder pain x 1 week. Pt denies any known injury or strenuous activity. No radicular sxs. Pain mainly on L neck and shoulder. Worse with movement. On fentanyl and prn tramadol. This has been minimally effective in managing sxs.  Past Medical History: Patient Active Problem List   Diagnosis Date Noted  . Opiate dependence 07/30/2011    Class: Acute  . CHEST PAIN, PRECORDIAL 02/18/2010  . ANEMIA 12/05/2009  . ANXIETY 12/05/2009  . TOBACCO ABUSE 12/05/2009  . PALPITATIONS 12/05/2009   Past Medical History  Diagnosis Date  . Anemia   . Heart murmur   . Hypertension   . Chronic low back pain     Past Surgical History: Past Surgical History  Procedure Laterality Date  . Back surgery      hardware in back  . Abdominal hysterectomy      abd?  . Tumor reomoved from stomach    . Cyst removed from ovary    . Removal of bb in finger  2008  . Salivary gland surgery    . Fracture surgery      LUMBAR SPINE    Social History: History   Social History  . Marital Status: Widowed    Spouse Name: N/A    Number of Children: N/A  . Years of Education: N/A   Social History Main Topics  . Smoking status: Current Every Day Smoker -- 0.50 packs/day  . Smokeless tobacco: None  . Alcohol Use: No  . Drug Use: No  . Sexual Activity: None   Other Topics Concern  . None   Social History Narrative   Single, 2 children; disabled.     Family History: Family History  Problem Relation Age of Onset  . Hypertension Mother   . Cancer Father     Allergies: Allergies  Allergen Reactions  .  Amoxicillin-Pot Clavulanate   . Ciprofloxacin   . Diphenhydramine Hcl   . Sulfonamide Derivatives   . Tetracycline     Current Outpatient Prescriptions  Medication Sig Dispense Refill  . estradiol (ESTRACE) 1 MG tablet Take 1 tablet (1 mg total) by mouth daily. For Hormone replacement.      . fentaNYL (DURAGESIC - DOSED MCG/HR) 25 MCG/HR patch Place 1 patch onto the skin every 3 (three) days.      Marland Kitchen. lisinopril (PRINIVIL,ZESTRIL) 5 MG tablet Take 3 tablets (15 mg total) by mouth daily. For high blood pressure.  90 tablet  0  . traMADol (ULTRAM) 50 MG tablet Take 50 mg by mouth every 6 (six) hours as needed for pain.       No current facility-administered medications for this visit.   Review Of Systems: 12 point ROS negative except as noted above in HPI.  Physical Exam: Filed Vitals:   04/04/13 1207  BP: 139/80  Pulse: 68  Temp: 97.3 F (36.3 C)    General: alert and cooperative HEENT: PERRLA and extra ocular movement intact Heart: S1, S2 normal, no murmur, rub or gallop, regular rate and rhythm Lungs: clear to auscultation, no wheezes or rales and unlabored breathing Abdomen: abdomen is soft without significant  tenderness, masses, organomegaly or guarding Extremities: + L neck TTP, spurlings negative, + L sided neck pain , worse with resisted neck flexion and extension. Neurovascularly intact distally. Shoulder exam WNL.  Skin:no rashes Neurology: normal without focal findings  Labs and Imaging [facility] reading (PRIMARY) by  Dr. Alvester Morin  C spine and L shoulder xray preliminarily negative for any acute fracture or dislocation. Noted arthritic and degenerative changes in c spine and L shoulder.                                    :No results found.   Assessment and Plan: Neck pain - Plan: DG Cervical Spine Complete  Pain in joint, shoulder region - Plan: DG Shoulder Left  Cervical strain - Plan: cyclobenzaprine (FLEXERIL) 10 MG tablet, meloxicam (MOBIC) 15 MG tablet   Exam  consistent with cervical strain.  Will place on course of flexeril and mobic.  No narcotics clinically indicated. This is NOT a viable option as pt is managed with chronic pain specialist in [place].  Discussed RICE.  May benefit from PT  Ortho referral if sxs persist despite treatment.  Will need a follow up appt for complete physical.     Doree Albee MD

## 2013-05-14 ENCOUNTER — Other Ambulatory Visit: Payer: Medicaid Other | Admitting: Nurse Practitioner

## 2013-05-22 ENCOUNTER — Encounter: Payer: Self-pay | Admitting: Cardiology

## 2013-05-24 DIAGNOSIS — R29898 Other symptoms and signs involving the musculoskeletal system: Secondary | ICD-10-CM | POA: Insufficient documentation

## 2013-05-30 ENCOUNTER — Other Ambulatory Visit: Payer: Self-pay | Admitting: *Deleted

## 2013-05-30 DIAGNOSIS — S161XXA Strain of muscle, fascia and tendon at neck level, initial encounter: Secondary | ICD-10-CM

## 2013-05-30 MED ORDER — MELOXICAM 15 MG PO TABS: 15.0000 mg | ORAL_TABLET | Freq: Every day | ORAL | Status: DC

## 2013-06-05 ENCOUNTER — Encounter: Payer: Self-pay | Admitting: Cardiology

## 2013-06-25 ENCOUNTER — Other Ambulatory Visit: Payer: Self-pay | Admitting: Family Medicine

## 2013-07-20 ENCOUNTER — Encounter: Payer: Self-pay | Admitting: Family Medicine

## 2013-07-20 ENCOUNTER — Ambulatory Visit (INDEPENDENT_AMBULATORY_CARE_PROVIDER_SITE_OTHER): Payer: Medicaid Other | Admitting: Family Medicine

## 2013-07-20 VITALS — BP 146/94 | HR 78 | Temp 99.1°F | Ht 72.0 in | Wt 178.8 lb

## 2013-07-20 DIAGNOSIS — J209 Acute bronchitis, unspecified: Secondary | ICD-10-CM

## 2013-07-20 MED ORDER — AMOXICILLIN 875 MG PO TABS: 875.0000 mg | ORAL_TABLET | Freq: Two times a day (BID) | ORAL | Status: DC

## 2013-07-20 NOTE — Progress Notes (Signed)
   Subjective:    Patient ID: Alice [name], female    DOB: 11/24/1965, 48 y.o.   MRN: [id number]  HPI C/o chest cold and uri sx's for over a week.  She states years ago she had an allergic rxn to augmentin but has no problems with amoxicillin.  She has been taking otc cough medicine.   Review of Systems No chest pain, SOB, HA, dizziness, vision change, N/V, diarrhea, constipation, dysuria, urinary urgency or frequency, myalgias, arthralgias or rash.     Objective:   Physical Exam   Vital signs noted  Well developed well nourished female.  HEENT - Head atraumatic Normocephalic                Eyes - PERRLA, Conjuctiva - clear Sclera- Clear EOMI                Ears - EAC's Wnl TM's Wnl Gross Hearing WNL                Throat - oropharanx wnl Respiratory - Lungs CTA bilateral Cardiac - RRR S1 and S2 without murmur GI - Abdomen soft Nontender and bowel sounds active x 4 Extremities - No edema. Neuro - Grossly intact.     Assessment & Plan:  Acute bronchitis, unspecified organism - Plan: amoxicillin (AMOXIL) 875 MG tablet Po bid x 10 days #20 ( She has recently been tx'd with amoxicillin w/o allergies) Push po fluids, rest, tylenol and motrin otc prn as directed for fever, arthralgias, and myalgias.  Follow up prn if sx's continue or persist.  Deatra CanterWilliam J Lanesha Azzaro FNP

## 2013-08-28 ENCOUNTER — Telehealth: Payer: Self-pay | Admitting: Nurse Practitioner

## 2013-08-28 NOTE — Telephone Encounter (Signed)
Spoke with pt  She is having URI symptoms Offered appt She will call back to schedule

## 2013-08-29 ENCOUNTER — Encounter: Payer: Self-pay | Admitting: Nurse Practitioner

## 2013-08-29 ENCOUNTER — Ambulatory Visit (INDEPENDENT_AMBULATORY_CARE_PROVIDER_SITE_OTHER): Payer: Medicaid Other | Admitting: Nurse Practitioner

## 2013-08-29 VITALS — BP 156/99 | HR 85 | Temp 97.6°F | Ht 72.0 in | Wt 177.0 lb

## 2013-08-29 DIAGNOSIS — J0301 Acute recurrent streptococcal tonsillitis: Secondary | ICD-10-CM

## 2013-08-29 DIAGNOSIS — J209 Acute bronchitis, unspecified: Secondary | ICD-10-CM

## 2013-08-29 DIAGNOSIS — J020 Streptococcal pharyngitis: Secondary | ICD-10-CM

## 2013-08-29 MED ORDER — BENZONATATE 100 MG PO CAPS: 200.0000 mg | ORAL_CAPSULE | Freq: Three times a day (TID) | ORAL | Status: DC | PRN

## 2013-08-29 MED ORDER — PREDNISONE 20 MG PO TABS: ORAL_TABLET | ORAL | Status: DC

## 2013-08-29 MED ORDER — METHYLPREDNISOLONE ACETATE 80 MG/ML IJ SUSP
80.0000 mg | Freq: Once | INTRAMUSCULAR | Status: AC
  Administered 2013-08-29: 80 mg via INTRAMUSCULAR

## 2013-08-29 NOTE — Patient Instructions (Signed)

## 2013-08-29 NOTE — Progress Notes (Signed)
   Subjective:    Patient ID: Alice [name], female    DOB: Dec 31, 1965, 48 y.o.   MRN: [id number]  HPI  Has been seen by Dr Andrey CampanileWilson 08-21-13 for URI and was given Penicillin and has two more days of medicine but is still having bilateral ear pain and coughing, and congestion.      Review of Systems  Constitutional: Negative.   HENT: Positive for congestion, ear pain and postnasal drip.   Respiratory: Positive for cough, chest tightness and shortness of breath.   Cardiovascular: Negative.   Skin: Negative.   Psychiatric/Behavioral: Negative.        Objective:   Physical Exam  Constitutional: She is oriented to person, place, and time. She appears well-developed and well-nourished.  HENT:  Head: Normocephalic.  Right Ear: Hearing, tympanic membrane, external ear and ear canal normal.  Left Ear: Hearing, tympanic membrane, external ear and ear canal normal.  Nose: Mucosal edema and rhinorrhea present.  Mouth/Throat: Oropharynx is clear and moist and mucous membranes are normal.  Eyes: Conjunctivae and EOM are normal. Pupils are equal, round, and reactive to light.  Cardiovascular: Normal rate, regular rhythm and normal heart sounds.   Pulmonary/Chest: Effort normal. She has rhonchi in the right lower field and the left upper field. She has rales in the right lower field and the left upper field.  Rales and rhonchi bilateral upper- deep tight cough  Neurological: She is alert and oriented to person, place, and time.  Skin: Skin is warm and dry.  Psychiatric: She has a normal mood and affect. Her behavior is normal. Judgment and thought content normal.    BP 156/99  Pulse 85  Temp(Src) 97.6 F (36.4 C) (Oral)  Ht 6' (1.829 m)  Wt 177 lb (80.287 kg)  BMI 24.00 kg/m2  LMP 07/30/2011       Assessment & Plan:  1. Acute bronchitis, unspecified organism 1. Take meds as prescribed 2. Use a cool mist humidifier especially during the winter months and when heat has been humid. 3. Use  saline nose sprays frequently 4. Saline irrigations of the nose can be very helpful if done frequently.  * 4X daily for 1 week*  * Use of a nettie pot can be helpful with this. Follow directions with this* 5. Drink plenty of fluids 6. Keep thermostat turn down low 7.For any cough or congestion  Use plain Mucinex- regular strength or max strength is fine   * Children- consult with Pharmacist for dosing 8. For fever or aces or pains- take tylenol or ibuprofen appropriate for age and weight.  * for fevers greater than 101 orally you may alternate ibuprofen and tylenol every  3 hours.   - benzonatate (TESSALON) 100 MG capsule; Take 2 capsules (200 mg total) by mouth 3 (three) times daily as needed for cough.  Dispense: 20 capsule; Refill: 0 - methylPREDNISolone acetate (DEPO-MEDROL) injection 80 mg; Inject 1 mL (80 mg total) into the muscle once. - predniSONE (DELTASONE) 20 MG tablet; 2 po qd X5d  Dispense: 10 tablet; Refill: 0  2. Recurrent streptococcal tonsillitis  - Ambulatory referral to ENT  Mary-Margaret Daphine DeutscherMartin, FNP

## 2013-10-29 ENCOUNTER — Telehealth: Payer: Self-pay | Admitting: Nurse Practitioner

## 2013-10-29 NOTE — Telephone Encounter (Signed)
appt made for Tuesday. Refused appt today

## 2013-10-30 ENCOUNTER — Encounter: Payer: Self-pay | Admitting: Family Medicine

## 2013-10-30 ENCOUNTER — Ambulatory Visit (INDEPENDENT_AMBULATORY_CARE_PROVIDER_SITE_OTHER): Payer: Medicaid Other | Admitting: Family Medicine

## 2013-10-30 VITALS — BP 134/95 | HR 86 | Temp 96.8°F | Ht 72.0 in | Wt 159.0 lb

## 2013-10-30 DIAGNOSIS — R300 Dysuria: Secondary | ICD-10-CM

## 2013-10-30 DIAGNOSIS — Z720 Tobacco use: Secondary | ICD-10-CM

## 2013-10-30 DIAGNOSIS — R05 Cough: Secondary | ICD-10-CM

## 2013-10-30 DIAGNOSIS — R059 Cough, unspecified: Secondary | ICD-10-CM

## 2013-10-30 DIAGNOSIS — F1910 Other psychoactive substance abuse, uncomplicated: Secondary | ICD-10-CM

## 2013-10-30 DIAGNOSIS — J209 Acute bronchitis, unspecified: Secondary | ICD-10-CM

## 2013-10-30 DIAGNOSIS — R030 Elevated blood-pressure reading, without diagnosis of hypertension: Secondary | ICD-10-CM

## 2013-10-30 LAB — POCT URINALYSIS DIPSTICK
Bilirubin, UA: NEGATIVE
GLUCOSE UA: NEGATIVE
Ketones, UA: NEGATIVE
Nitrite, UA: POSITIVE
Urobilinogen, UA: NEGATIVE
pH, UA: 6

## 2013-10-30 LAB — POCT UA - MICROSCOPIC ONLY
CRYSTALS, UR, HPF, POC: NEGATIVE
Casts, Ur, LPF, POC: NEGATIVE
Mucus, UA: NEGATIVE
Yeast, UA: NEGATIVE

## 2013-10-30 MED ORDER — PHENAZOPYRIDINE HCL 200 MG PO TABS: 200.0000 mg | ORAL_TABLET | Freq: Three times a day (TID) | ORAL | Status: DC | PRN

## 2013-10-30 MED ORDER — SULFAMETHOXAZOLE-TMP DS 800-160 MG PO TABS: 1.0000 | ORAL_TABLET | Freq: Two times a day (BID) | ORAL | Status: DC

## 2013-10-30 NOTE — Patient Instructions (Signed)
Drink plenty of fluids Take antibiotic as directed Take Mucinex, maximum strength, over the counter, blue and white in color, 1 twice daily with a large glass of water for cough and congestion Try to stop smoking

## 2013-10-30 NOTE — Progress Notes (Signed)
Subjective:    Patient ID: Alice [name], female    DOB: [birth year], 48 y.o.   MRN: [id number]  HPI Patient here today for upper respiratory symptoms and possible UTI. The cough/ cold symptoms started about 1 week ago. The patient has had fever chills congestion and cough and also some painful voiding symptoms.        Patient Active Problem List   Diagnosis Date Noted  . Opiate dependence 07/30/2011    Class: Acute  . CHEST PAIN, PRECORDIAL 02/18/2010  . ANEMIA 12/05/2009  . ANXIETY 12/05/2009  . TOBACCO ABUSE 12/05/2009  . PALPITATIONS 12/05/2009   Outpatient Encounter Prescriptions as of 10/30/2013  Medication Sig  . estradiol (ESTRACE) 1 MG tablet Take 1 tablet (1 mg total) by mouth daily. For Hormone replacement.  Marland Kitchen. lisinopril (PRINIVIL,ZESTRIL) 5 MG tablet Take 10 mg by mouth daily. For high blood pressure.  . traMADol (ULTRAM) 50 MG tablet Take 50 mg by mouth every 6 (six) hours as needed for pain.  . [DISCONTINUED] lisinopril (PRINIVIL,ZESTRIL) 5 MG tablet Take 3 tablets (15 mg total) by mouth daily. For high blood pressure.  . [DISCONTINUED] amoxicillin (AMOXIL) 875 MG tablet Take 1 tablet (875 mg total) by mouth 2 (two) times daily.  . [DISCONTINUED] benzonatate (TESSALON) 100 MG capsule Take 2 capsules (200 mg total) by mouth 3 (three) times daily as needed for cough.  . [DISCONTINUED] meloxicam (MOBIC) 15 MG tablet TAKE 1 TABLET (15 MG TOTAL) BY MOUTH DAILY.  . [DISCONTINUED] predniSONE (DELTASONE) 20 MG tablet 2 po qd X5d    Review of Systems  Constitutional: Positive for fever (at night) and chills.  HENT: Positive for congestion.   Eyes: Negative.   Respiratory: Positive for cough.   Cardiovascular: Negative.   Gastrointestinal: Negative.   Endocrine: Negative.   Genitourinary: Positive for dysuria.  Musculoskeletal: Negative.   Skin: Negative.   Allergic/Immunologic: Negative.   Neurological: Negative.   Hematological: Negative.     Psychiatric/Behavioral: Negative.        Objective:   Physical Exam  Nursing note and vitals reviewed. Constitutional: She is oriented to person, place, and time. She appears well-developed and well-nourished. No distress.  HENT:  Head: Normocephalic and atraumatic.  Right Ear: External ear normal.  Left Ear: External ear normal.  Mouth/Throat: Oropharynx is clear and moist. No oropharyngeal exudate.  Nasal congestion bilateral  Eyes: Conjunctivae and EOM are normal. Pupils are equal, round, and reactive to light. Right eye exhibits no discharge. Left eye exhibits no discharge. No scleral icterus.  Neck: Normal range of motion. Neck supple. No JVD present. No thyromegaly present.  No anterior cervical adenopathy  Cardiovascular: Normal rate, regular rhythm, normal heart sounds and intact distal pulses.  Exam reveals no gallop and no friction rub.   No murmur heard. Pulmonary/Chest: Effort normal. No respiratory distress. She has wheezes. She has no rales. She exhibits no tenderness.  Dry irritative cough with rhonchi and sparse wheezes  Abdominal: Soft. Bowel sounds are normal. She exhibits no mass. There is no tenderness. There is no rebound and no guarding.  No suprapubic tenderness  Musculoskeletal: Normal range of motion. She exhibits no edema.  Lymphadenopathy:    She has no cervical adenopathy.  Neurological: She is alert and oriented to person, place, and time.  Skin: Skin is warm and dry. No rash noted.  Psychiatric: She has a normal mood and affect. Her behavior is normal. Judgment and thought content normal.   BP 134/95  Pulse 86  Temp(Src) 96.8 F (36 C) (Oral)  Ht 6' (1.829 m)  Wt 159 lb (72.122 kg)  BMI 21.56 kg/m2  LMP 07/30/2011        Assessment & Plan:  1. Dysuria - POCT UA - Microscopic Only - POCT urinalysis dipstick - Urine culture - sulfamethoxazole-trimethoprim (BACTRIM DS) 800-160 MG per tablet; Take 1 tablet by mouth 2 (two) times daily.   Dispense: 20 tablet; Refill: 0 - phenazopyridine (PYRIDIUM) 200 MG tablet; Take 1 tablet (200 mg total) by mouth 3 (three) times daily as needed for pain.  Dispense: 10 tablet; Refill: 0  2. Cough - sulfamethoxazole-trimethoprim (BACTRIM DS) 800-160 MG per tablet; Take 1 tablet by mouth 2 (two) times daily.  Dispense: 20 tablet; Refill: 0  3. Nicotine abuse  4. Acute bronchitis, unspecified organism - sulfamethoxazole-trimethoprim (BACTRIM DS) 800-160 MG per tablet; Take 1 tablet by mouth 2 (two) times daily.  Dispense: 20 tablet; Refill: 0  5. Increased blood pressure (not hypertension)  Patient Instructions  Drink plenty of fluids Take antibiotic as directed Take Mucinex, maximum strength, over the counter, blue and white in color, 1 twice daily with a large glass of water for cough and congestion Try to stop smoking   Nyra Capeson W. Dakotah Orrego MD

## 2013-11-01 LAB — URINE CULTURE

## 2013-11-05 ENCOUNTER — Telehealth: Payer: Self-pay

## 2013-11-05 NOTE — Telephone Encounter (Signed)
Please find out from patient who he wants her referred to and where

## 2013-11-05 NOTE — Telephone Encounter (Signed)
Dr Andrey CampanileWilson (ENT) Wants her referred to another specialist

## 2013-11-06 ENCOUNTER — Other Ambulatory Visit: Payer: Self-pay

## 2013-11-06 DIAGNOSIS — R1314 Dysphagia, pharyngoesophageal phase: Secondary | ICD-10-CM

## 2013-11-16 ENCOUNTER — Telehealth: Payer: Self-pay | Admitting: Cardiology
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
## 2013-11-20 NOTE — Telephone Encounter (Signed)
I would be happy to see her, but looks like she's a patient of Dr Hochrein's. Please let her know that office policy is that we don't accept patient's from other providers.  thx

## 2013-11-23 NOTE — Telephone Encounter (Signed)
Attempted to reach the pt but she has a voicemail that is not set up at this time.

## 2013-11-27 NOTE — Telephone Encounter (Signed)
I spoke with the pt and made her aware that per Dr Excell Seltzerooper she needs to continue follow-up with Dr Antoine PocheHochrein.

## 2013-11-28 ENCOUNTER — Ambulatory Visit: Payer: Medicaid Other | Admitting: Neurology

## 2013-11-28 ENCOUNTER — Telehealth: Payer: Self-pay | Admitting: Neurology

## 2013-11-28 NOTE — Telephone Encounter (Signed)
This patient did not show for a new patient appointment today. 

## 2013-11-30 ENCOUNTER — Encounter: Payer: Self-pay | Admitting: *Deleted

## 2014-01-07 ENCOUNTER — Ambulatory Visit: Payer: Medicaid Other | Admitting: Physician Assistant

## 2014-01-09 ENCOUNTER — Ambulatory Visit: Payer: Medicaid Other | Admitting: Physician Assistant

## 2014-03-18 ENCOUNTER — Other Ambulatory Visit: Payer: Self-pay | Admitting: Nurse Practitioner

## 2014-03-19 ENCOUNTER — Telehealth: Payer: Self-pay | Admitting: Nurse Practitioner

## 2014-03-19 MED ORDER — MELOXICAM 15 MG PO TABS: ORAL_TABLET | ORAL | Status: DC

## 2014-03-19 NOTE — Telephone Encounter (Signed)
Patient aware.

## 2014-03-19 NOTE — Telephone Encounter (Signed)
mobic prescription for sent topharmacy no more refills without being seen

## 2014-03-19 NOTE — Telephone Encounter (Signed)
Not on med list

## 2014-04-04 ENCOUNTER — Ambulatory Visit: Payer: Medicaid Other | Admitting: Family Medicine

## 2014-04-23 ENCOUNTER — Other Ambulatory Visit: Payer: Self-pay | Admitting: Nurse Practitioner

## 2015-05-07 ENCOUNTER — Encounter (HOSPITAL_COMMUNITY): Payer: Self-pay | Admitting: Emergency Medicine

## 2015-05-07 ENCOUNTER — Emergency Department (HOSPITAL_COMMUNITY)
Admission: EM | Admit: 2015-05-07 | Discharge: 2015-05-08 | Disposition: A | Discharge status: Other Acute Inpatient Hospital | Payer: Medicaid Other | Attending: Emergency Medicine | Admitting: Emergency Medicine

## 2015-05-07 DIAGNOSIS — F32A Depression, unspecified: Secondary | ICD-10-CM

## 2015-05-07 DIAGNOSIS — F1110 Opioid abuse, uncomplicated: Secondary | ICD-10-CM | POA: Insufficient documentation

## 2015-05-07 DIAGNOSIS — Z862 Personal history of diseases of the blood and blood-forming organs and certain disorders involving the immune mechanism: Secondary | ICD-10-CM | POA: Insufficient documentation

## 2015-05-07 DIAGNOSIS — G8929 Other chronic pain: Secondary | ICD-10-CM | POA: Diagnosis not present

## 2015-05-07 DIAGNOSIS — F1410 Cocaine abuse, uncomplicated: Secondary | ICD-10-CM | POA: Insufficient documentation

## 2015-05-07 DIAGNOSIS — F329 Major depressive disorder, single episode, unspecified: Secondary | ICD-10-CM | POA: Diagnosis not present

## 2015-05-07 DIAGNOSIS — R011 Cardiac murmur, unspecified: Secondary | ICD-10-CM | POA: Insufficient documentation

## 2015-05-07 DIAGNOSIS — F17200 Nicotine dependence, unspecified, uncomplicated: Secondary | ICD-10-CM | POA: Diagnosis not present

## 2015-05-07 DIAGNOSIS — I10 Essential (primary) hypertension: Secondary | ICD-10-CM | POA: Diagnosis not present

## 2015-05-07 DIAGNOSIS — F1910 Other psychoactive substance abuse, uncomplicated: Secondary | ICD-10-CM

## 2015-05-07 DIAGNOSIS — F10129 Alcohol abuse with intoxication, unspecified: Secondary | ICD-10-CM | POA: Diagnosis present

## 2015-05-07 LAB — COMPREHENSIVE METABOLIC PANEL
ALBUMIN: 3.5 g/dL (ref 3.5–5.0)
ALT: 24 U/L (ref 14–54)
ANION GAP: 11 (ref 5–15)
AST: 29 U/L (ref 15–41)
Alkaline Phosphatase: 79 U/L (ref 38–126)
BILIRUBIN TOTAL: 0.1 mg/dL — AB (ref 0.3–1.2)
BUN: 6 mg/dL (ref 6–20)
CHLORIDE: 107 mmol/L (ref 101–111)
CO2: 24 mmol/L (ref 22–32)
Calcium: 9.1 mg/dL (ref 8.9–10.3)
Creatinine, Ser: 0.76 mg/dL (ref 0.44–1.00)
GFR calc Af Amer: >60 mL/min (ref >60)
Glucose, Bld: 129 mg/dL — ABNORMAL HIGH (ref 65–99)
POTASSIUM: 3.1 mmol/L — AB (ref 3.5–5.1)
Sodium: 142 mmol/L (ref 135–145)
TOTAL PROTEIN: 7.1 g/dL (ref 6.5–8.1)

## 2015-05-07 LAB — CBC WITH DIFFERENTIAL/PLATELET
BASOS ABS: 0 10*3/uL (ref 0.0–0.1)
Basophils Relative: 0 %
Eosinophils Absolute: 0.2 10*3/uL (ref 0.0–0.7)
Eosinophils Relative: 2 %
HEMATOCRIT: 36.1 % (ref 36.0–46.0)
Hemoglobin: 12.1 g/dL (ref 12.0–15.0)
LYMPHS PCT: 39 %
Lymphs Abs: 2.8 10*3/uL (ref 0.7–4.0)
MCH: 29 pg (ref 26.0–34.0)
MCHC: 33.5 g/dL (ref 30.0–36.0)
MCV: 86.6 fL (ref 78.0–100.0)
Monocytes Absolute: 0.8 10*3/uL (ref 0.1–1.0)
Monocytes Relative: 12 %
NEUTROS ABS: 3.4 10*3/uL (ref 1.7–7.7)
Neutrophils Relative %: 47 %
PLATELETS: 304 10*3/uL (ref 150–400)
RBC: 4.17 MIL/uL (ref 3.87–5.11)
RDW: 14.5 % (ref 11.5–15.5)
WBC: 7.2 10*3/uL (ref 4.0–10.5)

## 2015-05-07 LAB — ETHANOL

## 2015-05-07 MED ORDER — LORAZEPAM 1 MG PO TABS
1.0000 mg | ORAL_TABLET | Freq: Three times a day (TID) | ORAL | Status: DC | PRN
  Administered 2015-05-08: 1 mg via ORAL
  Filled 2015-05-07: qty 1

## 2015-05-07 MED ORDER — NICOTINE 21 MG/24HR TD PT24: 21.0000 mg | MEDICATED_PATCH | Freq: Every day | TRANSDERMAL | Status: DC

## 2015-05-07 MED ORDER — ONDANSETRON HCL 4 MG PO TABS: 4.0000 mg | ORAL_TABLET | Freq: Three times a day (TID) | ORAL | Status: DC | PRN

## 2015-05-07 MED ORDER — ACETAMINOPHEN 325 MG PO TABS
650.0000 mg | ORAL_TABLET | ORAL | Status: DC | PRN
  Administered 2015-05-08: 650 mg via ORAL
  Filled 2015-05-07: qty 2
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
## 2015-05-07 NOTE — ED Notes (Signed)
Patient present for detox from heroin, crack cocaine, narcotic, ETOH, suboxone. Last use yesterday. Patient c/o nausea, "feeling like crawling out of skin". Denies SI/HI/AVH.

## 2015-05-08 ENCOUNTER — Inpatient Hospital Stay (HOSPITAL_COMMUNITY)
Admission: EM | Admit: 2015-05-08 | Discharge: 2015-05-12 | DRG: 885 | Disposition: A | Discharge status: Home / Self Care | Payer: Medicaid Other | Source: Intra-hospital | Attending: Psychiatry | Admitting: Psychiatry

## 2015-05-08 ENCOUNTER — Encounter (HOSPITAL_COMMUNITY): Payer: Self-pay | Admitting: Behavioral Health

## 2015-05-08 DIAGNOSIS — F17210 Nicotine dependence, cigarettes, uncomplicated: Secondary | ICD-10-CM | POA: Diagnosis present

## 2015-05-08 DIAGNOSIS — F329 Major depressive disorder, single episode, unspecified: Secondary | ICD-10-CM | POA: Diagnosis present

## 2015-05-08 DIAGNOSIS — F1123 Opioid dependence with withdrawal: Secondary | ICD-10-CM | POA: Diagnosis present

## 2015-05-08 DIAGNOSIS — F332 Major depressive disorder, recurrent severe without psychotic features: Principal | ICD-10-CM | POA: Diagnosis present

## 2015-05-08 DIAGNOSIS — G4700 Insomnia, unspecified: Secondary | ICD-10-CM | POA: Diagnosis present

## 2015-05-08 DIAGNOSIS — F1994 Other psychoactive substance use, unspecified with psychoactive substance-induced mood disorder: Secondary | ICD-10-CM

## 2015-05-08 DIAGNOSIS — F1124 Opioid dependence with opioid-induced mood disorder: Secondary | ICD-10-CM | POA: Diagnosis present

## 2015-05-08 DIAGNOSIS — F1120 Opioid dependence, uncomplicated: Secondary | ICD-10-CM | POA: Diagnosis present

## 2015-05-08 DIAGNOSIS — I10 Essential (primary) hypertension: Secondary | ICD-10-CM | POA: Diagnosis present

## 2015-05-08 LAB — RAPID URINE DRUG SCREEN, HOSP PERFORMED
AMPHETAMINES: NOT DETECTED
BENZODIAZEPINES: NOT DETECTED
Barbiturates: NOT DETECTED
Cocaine: POSITIVE — AB
Opiates: NOT DETECTED
TETRAHYDROCANNABINOL: NOT DETECTED

## 2015-05-08 MED ORDER — CLONIDINE HCL 0.1 MG PO TABS
0.1000 mg | ORAL_TABLET | ORAL | Status: AC
  Administered 2015-05-10 – 2015-05-11 (×4): 0.1 mg via ORAL
  Filled 2015-05-08 (×4): qty 1

## 2015-05-08 MED ORDER — TRAZODONE HCL 50 MG PO TABS
50.0000 mg | ORAL_TABLET | Freq: Every evening | ORAL | Status: DC | PRN
  Administered 2015-05-08 – 2015-05-09 (×2): 50 mg via ORAL
  Filled 2015-05-08 (×8): qty 1

## 2015-05-08 MED ORDER — MAGNESIUM HYDROXIDE 400 MG/5ML PO SUSP: 30.0000 mL | Freq: Every day | ORAL | Status: DC | PRN

## 2015-05-08 MED ORDER — CLONIDINE HCL 0.1 MG PO TABS
0.1000 mg | ORAL_TABLET | Freq: Every day | ORAL | Status: DC
  Administered 2015-05-12: 0.1 mg via ORAL
  Filled 2015-05-08 (×2): qty 1

## 2015-05-08 MED ORDER — ONDANSETRON 4 MG PO TBDP: 4.0000 mg | ORAL_TABLET | Freq: Four times a day (QID) | ORAL | Status: DC | PRN

## 2015-05-08 MED ORDER — METHOCARBAMOL 500 MG PO TABS
500.0000 mg | ORAL_TABLET | Freq: Three times a day (TID) | ORAL | Status: DC | PRN
  Administered 2015-05-08 – 2015-05-09 (×2): 500 mg via ORAL
  Filled 2015-05-08 (×2): qty 1

## 2015-05-08 MED ORDER — ALUM & MAG HYDROXIDE-SIMETH 200-200-20 MG/5ML PO SUSP: 30.0000 mL | ORAL | Status: DC | PRN

## 2015-05-08 MED ORDER — ENSURE ENLIVE PO LIQD
237.0000 mL | Freq: Three times a day (TID) | ORAL | Status: DC
  Administered 2015-05-08 – 2015-05-12 (×12): 237 mL via ORAL

## 2015-05-08 MED ORDER — NITROFURANTOIN MONOHYD MACRO 100 MG PO CAPS
100.0000 mg | ORAL_CAPSULE | Freq: Two times a day (BID) | ORAL | Status: DC
  Administered 2015-05-08 – 2015-05-12 (×9): 100 mg via ORAL
  Filled 2015-05-08 (×13): qty 1

## 2015-05-08 MED ORDER — HYDROXYZINE HCL 25 MG PO TABS
25.0000 mg | ORAL_TABLET | Freq: Four times a day (QID) | ORAL | Status: DC | PRN
  Administered 2015-05-08: 25 mg via ORAL
  Filled 2015-05-08: qty 1

## 2015-05-08 MED ORDER — ACETAMINOPHEN 325 MG PO TABS
650.0000 mg | ORAL_TABLET | Freq: Four times a day (QID) | ORAL | Status: DC | PRN
  Administered 2015-05-10: 650 mg via ORAL
  Filled 2015-05-08: qty 2

## 2015-05-08 MED ORDER — NAPROXEN 500 MG PO TABS
500.0000 mg | ORAL_TABLET | Freq: Two times a day (BID) | ORAL | Status: DC | PRN
  Administered 2015-05-08 – 2015-05-10 (×3): 500 mg via ORAL
  Filled 2015-05-08 (×4): qty 1

## 2015-05-08 MED ORDER — DICYCLOMINE HCL 20 MG PO TABS
20.0000 mg | ORAL_TABLET | Freq: Four times a day (QID) | ORAL | Status: DC | PRN
  Administered 2015-05-08: 20 mg via ORAL
  Filled 2015-05-08: qty 1

## 2015-05-08 MED ORDER — CLONIDINE HCL 0.1 MG PO TABS
0.1000 mg | ORAL_TABLET | Freq: Four times a day (QID) | ORAL | Status: AC
  Administered 2015-05-08 – 2015-05-09 (×3): 0.1 mg via ORAL
  Filled 2015-05-08 (×8): qty 1

## 2015-05-08 MED ORDER — TUBERCULIN PPD 5 UNIT/0.1ML ID SOLN
5.0000 [IU] | Freq: Once | INTRADERMAL | Status: AC
  Administered 2015-05-08: 5 [IU] via INTRADERMAL

## 2015-05-08 MED ORDER — POTASSIUM CHLORIDE CRYS ER 20 MEQ PO TBCR
20.0000 meq | EXTENDED_RELEASE_TABLET | Freq: Two times a day (BID) | ORAL | Status: AC
  Administered 2015-05-08 – 2015-05-09 (×4): 20 meq via ORAL
  Filled 2015-05-08 (×4): qty 1

## 2015-05-08 MED ORDER — LOPERAMIDE HCL 2 MG PO CAPS: 2.0000 mg | ORAL_CAPSULE | ORAL | Status: DC | PRN

## 2015-05-08 MED ORDER — LISINOPRIL 10 MG PO TABS
10.0000 mg | ORAL_TABLET | Freq: Every day | ORAL | Status: DC
  Administered 2015-05-08 – 2015-05-12 (×5): 10 mg via ORAL
  Filled 2015-05-08 (×7): qty 1

## 2015-05-08 NOTE — ED Notes (Signed)
ROI form in chart for pt mother and sisters.

## 2015-05-08 NOTE — [facility] Suicide Risk Assessment (Signed)
[facility]   Nursing information obtained from:  Patient Demographic factors:  Low socioeconomic status, Caucasian Current Mental Status:  NA Loss Factors:  Financial problems / change in socioeconomic status Historical Factors:  NA Risk Reduction Factors:  Positive social support, Sense of responsibility to family  Total Time spent with patient: 45 minutes Principal Problem: Severe recurrent major depression without psychotic features (HCC) Diagnosis:   Patient Active Problem List   Diagnosis Date Noted  . Substance induced mood disorder (HCC) [F19.94] 05/08/2015  . Severe recurrent major depression without psychotic features (HCC) [F33.2] 05/08/2015  . Nicotine abuse [F19.10] 10/30/2013  . Opiate dependence (HCC) [F11.20] 07/30/2011    Class: Acute  . CHEST PAIN, PRECORDIAL [R07.2] 02/18/2010  . ANEMIA [D64.9] 12/05/2009  . ANXIETY [F41.1] 12/05/2009  . TOBACCO ABUSE [F17.200] 12/05/2009  . PALPITATIONS [R00.2] 12/05/2009   Subjective Data: see admission H and P  Continued Clinical Symptoms:  Alcohol Use Disorder Identification Test Final Score (AUDIT): 17 The "Alcohol Use Disorders Identification Test", Guidelines for Use in Primary Care, Second Edition.  World Science writerHealth Organization [facility](WHO). Score between 0-7:  no or low risk or alcohol related problems. Score between 8-15:  moderate risk of alcohol related problems. Score between 16-19:  high risk of alcohol related problems. Score 20 or above:  warrants further diagnostic evaluation for alcohol dependence and treatment.   CLINICAL FACTORS:   Depression:   Comorbid alcohol abuse/dependence Alcohol/Substance Abuse/Dependencies Medical Diagnoses and Treatments/Surgeries   Psychiatric Specialty Exam: ROS  Blood pressure 100/75, pulse 75, temperature 98 F (36.7 C), temperature source Oral, resp. rate 16, height 6' (1.829 m), weight 61.689 kg (136 lb), last menstrual period 07/30/2011, SpO2 100  %.Body mass index is 18.44 kg/(m^2).   COGNITIVE FEATURES THAT CONTRIBUTE TO RISK:  Closed-mindedness, Polarized thinking and Thought constriction (tunnel vision)    SUICIDE RISK:   Moderate:  Frequent suicidal ideation with limited intensity, and duration, some specificity in terms of plans, no associated intent, good self-control, limited dysphoria/symptomatology, some risk factors present, and identifiable protective factors, including available and accessible social support.  PLAN OF CARE: See admission H and P  I certify that inpatient services furnished can reasonably be expected to improve the patient's condition.   Rachael FeeLUGO,Dalton Molesworth A, MD 05/08/2015, 6:00 PM
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
## 2015-05-08 NOTE — ED Notes (Signed)
Patient calm, cooperative. Denies SI, HI, AVH. Reports feeling like her skin is crawling. Reports that she wants help with drug abuse.  Oriented to the unit. Encouragement offered. Given OJ, Tylenol, Ativan.  Q 15 safety checks in place.

## 2015-05-08 NOTE — ED Notes (Signed)
Patient belongings sent home with family.   Slides Top Pants Hair tie Sweater Black tote bag

## 2015-05-08 NOTE — ED Notes (Signed)
[facility] Expected date: 05/08/15 Expected time: 12:20 AM Means of arrival:  Comments: Alice [name]

## 2015-05-08 NOTE — Progress Notes (Signed)
D: Pt present with flat affect and depressed mood. Pt rates depression tonight 6/10. Pt denies active suicidal thoughts. Pt reports withdrawal symptoms of: cramps, spasms, generalized pain and anxiety. Pt given prn meds at her request for discomfort. Pt hypotensive tonight. Clonidine not given. Fluid given and pt encouraged to increase fluid intake. Pt asymptomatic, no headache, light headiness or dizziness. Pt refused to attend Karaoke group tonight. A: Medications reviewed with pt. Medications administered as ordered per MD. Verbal support provided. Pt encouraged to attend Karaoke as tolerated.15 minute checks performed for safety.  R: Pt receptive to tx. Pt verbalized understanding of med regimen.

## 2015-05-08 NOTE — [facility] Group Notes (Signed)
[facility] Group Notes:  (Nursing/MHT/Case Management/Adjunct)  Date:  05/08/2015  Time:  10:10 AM  Type of Therapy:  Psychoeducational Skills  Participation Level:  Did Not Attend  Participation Quality:  DID NOT ATTEND  Affect:  DID NOT ATTEND  Cognitive:  DID NOT ATTEND  Insight:  None  Engagement in Group:  DID NOT ATTEND  Modes of Intervention:  DID NOT ATTEND  Summary of Progress/Problems: Pt did not attend patient self inventory group.   Bethann PunchesJane O Waynesha Rammel 05/08/2015, 10:10 AM

## 2015-05-08 NOTE — Progress Notes (Signed)
Patient ID: Alice [name], female   DOB: May 10, 1965, 50 y.o.   MRN: [id number]  DAR: Pt. Denies SI/HI and A/V Hallucinations. Patient does report diffuse pain that is generalized however refuses intervention at this time. Patient's affect is flat and mood depressed. Patient appears disheveled and continues to wear hospital supplied scrubs. Support and encouragement provided to the patient. Scheduled medications administered to patient per physician's orders. Patient has laid in bed throughout most of the day. She reports moderate withdrawal symptoms and continues to follow Clonidine protocol when order parameters are met. Q15 minute checks are maintained for safety.

## 2015-05-08 NOTE — [facility] Group Notes (Signed)
[facility] LCSW Group Therapy  05/08/2015 1:11 PM  Type of Therapy:  Group Therapy  Participation Level:  Did Not Attend-pt chose to rest in bed. Invited.   Modes of Intervention:  Confrontation, Discussion, Education, Exploration, Problem-solving, Rapport Building, Socialization and Support  Summary of Progress/Problems: Today's Topic: Overcoming Obstacles. Patients identified one short term goal and potential obstacles in reaching this goal. Patients processed barriers involved in overcoming these obstacles. Patients identified steps necessary for overcoming these obstacles and explored motivation (internal and external) for facing these difficulties head on.   Smart, Karynn Deblasi LCSW 05/08/2015, 1:11 PM
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
## 2015-05-08 NOTE — BH Assessment (Signed)
[facility]Dr. Dub MikesLugo). Informed Elpidio AnisShari Upstill, PA-C of the disposition. Support paperwork completed.

## 2015-05-08 NOTE — Progress Notes (Signed)
Nursing Admission Note: 50 y/o female who presents voluntarily for polysub abuse and requesting detox.  Patient states she is using heroin, opana, etoh, and cocaine.  Patient denies SI/HI and denies AVH.  Patient states she live alone but has strong family support.  Patient states she has wanted to do this a long time.  Patient is extremely drowsy during admission due to Ativan [facility].  Patient is had minimal conversation with Clinical research associatewriter.  Patient skin assess and has no skin issues.  Patient denies physical, sexual and verbal abuse.  Consents obtained, fall safety plan explained and patient verbalized understanding.  Patient escorted and oriented to the unit.  Food and fluids offered and patient accepted both.  Patient had no belongings secured in a locker on admission.  Patient offered no additional questions or concerns.

## 2015-05-08 NOTE — Tx Team (Signed)
Interdisciplinary Treatment Plan Update (Adult)  Date:  05/08/2015  Time Reviewed:  8:53 AM   Progress in Treatment: Attending groups: No. New to unit. Continuing to assess.  Participating in groups:  No. Taking medication as prescribed:  Yes. Tolerating medication:  Yes. Family/Significant othe contact made:  SPE required for this pt.  Patient understands diagnosis:  Yes. and As evidenced by:  seeking treatment for polysubstance abuse, depression, insomnia, and for medication stabilization. Discussing patient identified problems/goals with staff:  Yes. Medical problems stabilized or resolved:  Yes. Denies suicidal/homicidal ideation: Yes. Issues/concerns per patient self-inventory:  Other:  Discharge Plan or Barriers: CSW assessing for appropriate referrals.   Reason for Continuation of Hospitalization: Depression Medication stabilization Withdrawal symptoms  Comments:  Alice [name] is an 50 y.o. female presenting to [facility] reporting increasing depression and requesting detox. Pt reported that she contacted mobile crisis today and they recommended that she come to the hospital. Pt denies SI, HI and AVH at this time. Pt did not report any previous suicide attempts or self-injurious behaviors. Pt did not report any current mental health treatment but shared that she has had substance abuse treatment in the past. Pt is reporting multiple depressive symptoms and shared that her sleep is poor. Pt reported that her appetite is good but reported losing "70lbs over 2 months". Pt reported that she is dealing with multiple stressors and stated "I have cervical cancer". PT did not report any pending criminal charges or upcoming court dates but shared that she is on probation for bad checks. Pt reported that she abuses Opana, heroin, crack/cocaine, Roxy's, Suboxone and Oxycotin. PT did not report any physical, sexual or emotional abuse at this time. Diagnosis: Major Depressive Disorder; Opioid Use  Disorder, Severe; Cocaine Use Disorder; Severe   Estimated length of stay:  3-5 days   New goal(s): to develop effective aftercare plan.   Additional Comments:  Patient and CSW reviewed pt's identified goals and treatment plan. Patient verbalized understanding and agreed to treatment plan. CSW reviewed [facility] "Discharge Process and Patient Involvement" Form. Pt verbalized understanding of information provided and signed form.    Review of initial/current patient goals per problem list:  1. Goal(s): Patient will participate in aftercare plan  Met: No.   Target date: at discharge  As evidenced by: Patient will participate within aftercare plan AEB aftercare provider and housing plan at discharge being identified.  4/27: CSW assessing for appropriate referrals.   2. Goal (s): Patient will exhibit decreased depressive symptoms and suicidal ideations.  Met: No.    Target date: at discharge  As evidenced by: Patient will utilize self rating of depression at 3 or below and demonstrate decreased signs of depression or be deemed stable for discharge by MD.  4/27: Pt rates depression as high but denies SI/HI/AVH.   3. Goal(s): Patient will demonstrate decreased signs of withdrawal due to substance abuse  Met:No.   Target date:at discharge   As evidenced by: Patient will produce a CIWA/COWS score of 0, have stable vitals signs, and no symptoms of withdrawal.  4/27: Pt reports mild withdrawals with COWS of 1 and stable vitals.   Attendees: Patient:   05/08/2015 8:53 AM   Family:   05/08/2015 8:53 AM   Physician:  Dr. Carlton Adam, MD 05/08/2015 8:53 AM   Nursing:   Chestine Spore RN 05/08/2015 8:53 AM   Clinical Social Worker: Maxie Better, LCSW 05/08/2015 8:53 AM   Clinical Social Worker: Erasmo Downer Drinkard LCSW 05/08/2015 8:53 AM  Other:  Gerline Legacy Nurse Case Manager 05/08/2015 8:53 AM   Other:  Agustina Caroli NP  05/08/2015 8:53 AM   Other:   05/08/2015 8:53 AM   Other:  05/08/2015  8:53 AM   Other:  05/08/2015 8:53 AM   Other:  05/08/2015 8:53 AM    05/08/2015 8:53 AM    05/08/2015 8:53 AM    05/08/2015 8:53 AM    05/08/2015 8:53 AM    Scribe for Treatment Team:   Maxie Better, LCSW 05/08/2015 8:53 AM

## 2015-05-08 NOTE — BH Assessment (Addendum)
Tele Assessment Note   Alice [name] is an 50 y.o. female presenting to [facility] reporting increasing depression and requesting detox. Pt reported that she contacted mobile crisis today and they recommended that she come to the hospital.  Pt denies SI, HI and AVH at this time. Pt did not report any previous suicide attempts or self-injurious behaviors. Pt did not report any current mental health treatment but shared that she has had substance abuse treatment in the past. Pt is reporting multiple depressive symptoms and shared that her sleep is poor. Pt reported that her appetite is good but reported losing "70lbs over 2 months". Pt reported that she is dealing with multiple stressors and stated "I have cervical cancer".  PT did not report any pending criminal charges or upcoming court dates but shared that she is on probation for bad checks. Pt reported that she abuses Opana, heroin, crack/cocaine, Roxy's, Suboxone and Oxycotin. PT did not report any physical, sexual or emotional abuse at this time.  Inpatient treatment is recommended.   Diagnosis: Major Depressive Disorder; Opioid Use Disorder, Severe; Cocaine Use Disorder; Severe  Past Medical History:  Past Medical History  Diagnosis Date  . Anemia   . Heart murmur   . Hypertension   . Chronic low back pain     Past Surgical History  Procedure Laterality Date  . Back surgery      hardware in back  . Abdominal hysterectomy      abd?  . Tumor reomoved from stomach    . Cyst removed from ovary    . Removal of bb in finger  2008  . Salivary gland surgery    . Fracture surgery      LUMBAR SPINE    Family History:  Family History  Problem Relation Age of Onset  . Hypertension Mother   . Cancer Father     Social History:  reports that she has been smoking.  She does not have any smokeless tobacco history on file. She reports that she does not drink alcohol or use illicit drugs.  Additional Social History:  Alcohol / Drug Use History of  alcohol / drug use?: Yes Longest period of sobriety (when/how long): "24 years"  Substance #1 Name of Substance 1: Opana  1 - Age of First Use: 48 1 - Amount (size/oz): 1to 5 40 mg  1 - Frequency: daily  1 - Duration: 2 years 1 - Last Use / Amount: 05-06-15 Substance #2 Name of Substance 2: Roxy's  2 - Age of First Use: 48 2 - Amount (size/oz): 15-80mg s  2 - Frequency: daily  2 - Duration: 2 years  2 - Last Use / Amount: 05-06-15 Substance #3 Name of Substance 3: Heroin  3 - Age of First Use: 14 3 - Amount (size/oz): $40 3 - Frequency: daily (multiple times) $40 per time  3 - Duration: 2 years 3 - Last Use / Amount: 05-06-15 Substance #4 Name of Substance 4: Crack/Cocaine  4 - Age of First Use: 21 4 - Amount (size/oz): $500 4 - Frequency: daily  4 - Duration: 2 years  4 - Last Use / Amount: 05-06-15 Substance #5 Name of Substance 5: Suboxone  5 - Age of First Use: 49 5 - Amount (size/oz): "a strip"  5 - Frequency: 1 to 2 times monthly  5 - Duration: 1 year  5 - Last Use / Amount: 05-06-15 Substance #6 Name of Substance 6: Oxycotin  6 - Age of First Use: 48 6 -  Amount (size/oz): 3-30 mg  6 - Frequency: 1-2 monthly  6 - Duration: 2 years  6 - Last Use / Amount: 05-06-15  CIWA: CIWA-Ar BP: 115/70 mmHg Pulse Rate: 88 COWS:    PATIENT STRENGTHS: (choose at least two) Average or above average intelligence Motivation for treatment/growth  Allergies:  Allergies  Allergen Reactions  . Tetracycline     Home Medications:  (Not in a hospital admission)  OB/GYN Status:  Patient's last menstrual period was 07/30/2011.  General Assessment Data Location of Assessment: [facility] TTS Assessment: In system Is this a Tele or Face-to-Face Assessment?: Face-to-Face Is this an Initial Assessment or a Re-assessment for this encounter?: Initial Assessment Marital status: Single Living Arrangements: Alone Can pt return to current living arrangement?: Yes Admission Status:  Voluntary Is patient capable of signing voluntary admission?: Yes Referral Source: Self/Family/Friend Insurance type: Medicaid      Crisis Care Plan Living Arrangements: Alone Name of Psychiatrist: No provider reported.  Name of Therapist: No provider reported   Education Status Is patient currently in school?: No  Risk to self with the past 6 months Suicidal Ideation: No Has patient been a risk to self within the past 6 months prior to admission? : No Suicidal Intent: No Has patient had any suicidal intent within the past 6 months prior to admission? : No Is patient at risk for suicide?: No Suicidal Plan?: No Has patient had any suicidal plan within the past 6 months prior to admission? : No Access to Means: No What has been your use of drugs/alcohol within the last 12 months?: Daily drug use reported.  Previous Attempts/Gestures: No How many times?: 0 Other Self Harm Risks: Pt denies.  Triggers for Past Attempts: None known (No previous attempts reported. ) Intentional Self Injurious Behavior: None Family Suicide History: No Recent stressful life event(s): Other (Comment) ("I have cancer" ) Persecutory voices/beliefs?: No Depression: Yes Depression Symptoms: Despondent, Tearfulness, Isolating, Guilt, Feeling angry/irritable, Loss of interest in usual pleasures Substance abuse history and/or treatment for substance abuse?: Yes Suicide prevention information given to non-admitted patients: Not applicable  Risk to Others within the past 6 months Homicidal Ideation: No Does patient have any lifetime risk of violence toward others beyond the six months prior to admission? : No Thoughts of Harm to Others: No Current Homicidal Intent: No Current Homicidal Plan: No Access to Homicidal Means: No Identified Victim: N/A History of harm to others?: No Assessment of Violence: None Noted Violent Behavior Description: No violent behaviors observed.  Does patient have access to  weapons?: No Criminal Charges Pending?: No Does patient have a court date: No Is patient on probation?: Yes  Psychosis Hallucinations: None noted Delusions: None noted  Mental Status Report Appearance/Hygiene: In scrubs Eye Contact: Fair Motor Activity: Freedom of movement Speech: Logical/coherent Level of Consciousness: Quiet/awake Mood: Depressed Affect: Appropriate to circumstance Anxiety Level: Minimal Thought Processes: Coherent, Relevant Judgement: Unimpaired Orientation: Person, Place, Time, Appropriate for developmental age, Situation Obsessive Compulsive Thoughts/Behaviors: None  Cognitive Functioning Concentration: Normal Memory: Recent Intact, Remote Intact IQ: Average Insight: Fair Impulse Control: Fair Appetite: Good Weight Loss: 70 (2 months ) Weight Gain: 0 Sleep: Decreased Total Hours of Sleep: 2 Vegetative Symptoms: None  ADLScreening [facility] Assessment Services) Patient's cognitive ability adequate to safely complete daily activities?: Yes Patient able to express need for assistance with ADLs?: Yes Independently performs ADLs?: Yes (appropriate for developmental age)  Prior Inpatient Therapy Prior Inpatient Therapy: Yes Prior Therapy Dates: 2013 Prior Therapy Facilty/Provider(s): [facility]  Reason for Treatment: substance abuse  Prior Outpatient Therapy Prior Outpatient Therapy: No Does patient have an ACCT team?: No Does patient have Intensive In-House Services?  : No Does patient have [facility] services? : No Does patient have P4CC services?: No  ADL Screening (condition at time of admission) Patient's cognitive ability adequate to safely complete daily activities?: Yes Is the patient deaf or have difficulty hearing?: No Does the patient have difficulty seeing, even when wearing glasses/contacts?: No Does the patient have difficulty concentrating, remembering, or making decisions?: No Patient able to express need for assistance with ADLs?:  Yes Does the patient have difficulty dressing or bathing?: No Independently performs ADLs?: Yes (appropriate for developmental age)       Abuse/Neglect Assessment (Assessment to be complete while patient is alone) Physical Abuse: Denies Verbal Abuse: Denies Sexual Abuse: Denies Exploitation of patient/patient's resources: Denies Self-Neglect: Denies     Merchant navy officerAdvance Directives (For Healthcare) Does patient have an advance directive?: No Would patient like information on creating an advanced directive?: No - patient declined information    Additional Information 1:1 In Past 12 Months?: No CIRT Risk: No Elopement Risk: No Does patient have medical clearance?: Yes     Disposition:  Disposition Initial Assessment Completed for this Encounter: Yes Disposition of Patient: Inpatient treatment program Type of inpatient treatment program: Adult  Alice [name] S 05/08/2015 1:20 AM

## 2015-05-08 NOTE — Progress Notes (Signed)
Patient ID: Alice [name], female   DOB: 1965/05/26, 50 y.o.   MRN: [id number]  PPD placed in patient's right arm 05/08/2015 at 1718. Please read after 1718 on 05/10/2015. A physical copy of this administration documentation is in patient's physical chart.

## 2015-05-09 MED ORDER — METHOCARBAMOL 500 MG PO TABS
500.0000 mg | ORAL_TABLET | Freq: Four times a day (QID) | ORAL | Status: DC | PRN
  Administered 2015-05-09 – 2015-05-12 (×9): 500 mg via ORAL
  Filled 2015-05-09 (×10): qty 1

## 2015-05-09 NOTE — Progress Notes (Signed)
DAR Note: Alice [name] has been in her room much of the morning.  She will come out to take her medications and go to meals.  She did not attend morning or afternoon groups.  She denies SI/HI or A/V hallucinations.  She continues to report generalized pain and naproxen given with minimal relief and Robaxin given for muscle spasms with "some" relief.  She has been noted sleeping in her bed this afternoon.  Clonidine held at lunch due to low BP.  She did not complete her self inventory even after much encouragement.  Encouraged participation in group and unit activities as well as getting up and moving around the unit.  Q 15 minute checks maintained for safety.  We will continue to monitor the progress towards her goals.

## 2015-05-09 NOTE — Progress Notes (Signed)
[facility] MD Progress Note  05/09/2015 3:19 PM Alice [name]  MRN:  [id number] Subjective:  Alice [name] states that she is still having a hard time. She endorses no energy no motivation. States she needs to go to a residential treatment program to get herself together. Because she is trying to help herself, her family is supportive of her Principal Problem: Severe recurrent major depression without psychotic features ([place]) Diagnosis:   Patient Active Problem List   Diagnosis Date Noted  . Substance induced mood disorder ([place]) [F19.94] 05/08/2015  . Severe recurrent major depression without psychotic features ([place]) [F33.2] 05/08/2015  . Nicotine abuse [F19.10] 10/30/2013  . Opiate dependence ([place]) [F11.20] 07/30/2011    Class: Acute  . CHEST PAIN, PRECORDIAL [R07.2] 02/18/2010  . ANEMIA [D64.9] 12/05/2009  . ANXIETY [F41.1] 12/05/2009  . TOBACCO ABUSE [F17.200] 12/05/2009  . PALPITATIONS [R00.2] 12/05/2009   Total Time spent with patient: 20 minutes  Past Psychiatric History: see admission H and P  Past Medical History:  Past Medical History  Diagnosis Date  . Anemia   . Heart murmur   . Hypertension   . Chronic low back pain     Past Surgical History  Procedure Laterality Date  . Back surgery      hardware in back  . Abdominal hysterectomy      abd?  . Tumor reomoved from stomach    . Cyst removed from ovary    . Removal of bb in finger  2008  . Salivary gland surgery    . Fracture surgery      LUMBAR SPINE   Family History:  Family History  Problem Relation Age of Onset  . Hypertension Mother   . Cancer Father    Family Psychiatric  History: see admission H and P Social History:  History  Alcohol Use No     History  Drug Use  . Yes  . Special: Heroin, "Crack" cocaine, Oxycodone    Social History   Social History  . Marital Status: Widowed    Spouse Name: N/A  . Number of Children: N/A  . Years of Education: N/A   Social History Main Topics  . Smoking status:  Current Every Day Smoker -- 0.50 packs/day  . Smokeless tobacco: None  . Alcohol Use: No  . Drug Use: Yes    Special: Heroin, "Crack" cocaine, Oxycodone  . Sexual Activity: No   Other Topics Concern  . None   Social History Narrative   Single, 2 children; disabled.    Additional Social History:    Pain Medications: roxi's Prescriptions: see pta med list History of alcohol / drug use?: Yes Longest period of sobriety (when/how long): 24 years Negative Consequences of Use: Personal relationships, Financial Withdrawal Symptoms: Sweats, Tremors Name of Substance 1: Opana  1 - Age of First Use: 48 1 - Amount (size/oz): 1to 5 40 mg  1 - Frequency: daily  1 - Duration: 2 years 1 - Last Use / Amount: 05-06-15 Name of Substance 2: Roxy's  2 - Age of First Use: 48 2 - Amount (size/oz): 15-9ms  2 - Frequency: daily  2 - Duration: 2 years  2 - Last Use / Amount: 05-06-15 Name of Substance 3: Heroin  3 - Age of First Use: 14 3 - Amount (size/oz): $40 3 - Frequency: daily (multiple times) $40 per time  3 - Duration: 2 years 3 - Last Use / Amount: 05-06-15 Name of Substance 4: Crack/Cocaine  4 - Age of First Use:  21 4 - Amount (size/oz): $500 4 - Frequency: daily  4 - Duration: 2 years  4 - Last Use / Amount: 05-06-15 Name of Substance 5: Suboxone  5 - Age of First Use: 49 5 - Amount (size/oz): "a strip"  5 - Frequency: 1 to 2 times monthly  5 - Duration: 1 year  Name of Substance 6: Oxycotin  6 - Age of First Use: 48 6 - Amount (size/oz): 3-30 mg  6 - Frequency: 1-2 monthly  6 - Duration: 2 years  6 - Last Use / Amount: 05-06-15        Sleep: Fair  Appetite:  Fair  Current Medications: Current Facility-Administered Medications  Medication Dose Route Frequency Provider Last Rate Last Dose  . acetaminophen (TYLENOL) tablet 650 mg  650 mg Oral Q6H PRN Laverle Hobby, PA-C      . alum & mag hydroxide-simeth (MAALOX/MYLANTA) 200-200-20 MG/5ML suspension 30 mL  30 mL Oral  Q4H PRN Laverle Hobby, PA-C      . cloNIDine (CATAPRES) tablet 0.1 mg  0.1 mg Oral QID Laverle Hobby, PA-C   0.1 mg at 05/09/15 0813   Followed by  . [START ON 05/10/2015] cloNIDine (CATAPRES) tablet 0.1 mg  0.1 mg Oral BH-qamhs Spencer E Simon, PA-C       Followed by  . [START ON 05/12/2015] cloNIDine (CATAPRES) tablet 0.1 mg  0.1 mg Oral QAC breakfast Laverle Hobby, PA-C      . dicyclomine (BENTYL) tablet 20 mg  20 mg Oral Q6H PRN Laverle Hobby, PA-C   20 mg at 05/08/15 2020  . feeding supplement (ENSURE ENLIVE) (ENSURE ENLIVE) liquid 237 mL  237 mL Oral TID BM Nicholaus Bloom, MD   237 mL at 05/09/15 1315  . hydrOXYzine (ATARAX/VISTARIL) tablet 25 mg  25 mg Oral Q6H PRN Laverle Hobby, PA-C   25 mg at 05/08/15 2020  . lisinopril (PRINIVIL,ZESTRIL) tablet 10 mg  10 mg Oral Daily Laverle Hobby, PA-C   10 mg at 05/09/15 2637  . loperamide (IMODIUM) capsule 2-4 mg  2-4 mg Oral PRN Laverle Hobby, PA-C      . magnesium hydroxide (MILK OF MAGNESIA) suspension 30 mL  30 mL Oral Daily PRN Laverle Hobby, PA-C      . methocarbamol (ROBAXIN) tablet 500 mg  500 mg Oral Q6H PRN Nicholaus Bloom, MD   500 mg at 05/09/15 1254  . naproxen (NAPROSYN) tablet 500 mg  500 mg Oral BID PRN Laverle Hobby, PA-C   500 mg at 05/09/15 1146  . nitrofurantoin (macrocrystal-monohydrate) (MACROBID) capsule 100 mg  100 mg Oral Q12H Nicholaus Bloom, MD   100 mg at 05/09/15 0814  . ondansetron (ZOFRAN-ODT) disintegrating tablet 4 mg  4 mg Oral Q6H PRN Laverle Hobby, PA-C      . potassium chloride SA (K-DUR,KLOR-CON) CR tablet 20 mEq  20 mEq Oral BID Laverle Hobby, PA-C   20 mEq at 05/09/15 0814  . traZODone (DESYREL) tablet 50 mg  50 mg Oral QHS,MR X 1 Nicholaus Bloom, MD   50 mg at 05/08/15 2219  . tuberculin injection 5 Units  5 Units Intradermal Once Nicholaus Bloom, MD   5 Units at 05/08/15 1718    Lab Results:  Results for orders placed or performed during the hospital encounter of 05/07/15 (from the past 48  hour(s))  CBC with Differential     Status: None   Collection  Time: 05/07/15 10:54 PM  Result Value Ref Range   WBC 7.2 4.0 - 10.5 K/uL   RBC 4.17 3.87 - 5.11 MIL/uL   Hemoglobin 12.1 12.0 - 15.0 g/dL   HCT 36.1 36.0 - 46.0 %   MCV 86.6 78.0 - 100.0 fL   MCH 29.0 26.0 - 34.0 pg   MCHC 33.5 30.0 - 36.0 g/dL   RDW 14.5 11.5 - 15.5 %   Platelets 304 150 - 400 K/uL   Neutrophils Relative % 47 %   Neutro Abs 3.4 1.7 - 7.7 K/uL   Lymphocytes Relative 39 %   Lymphs Abs 2.8 0.7 - 4.0 K/uL   Monocytes Relative 12 %   Monocytes Absolute 0.8 0.1 - 1.0 K/uL   Eosinophils Relative 2 %   Eosinophils Absolute 0.2 0.0 - 0.7 K/uL   Basophils Relative 0 %   Basophils Absolute 0.0 0.0 - 0.1 K/uL  Comprehensive metabolic panel     Status: Abnormal   Collection Time: 05/07/15 10:54 PM  Result Value Ref Range   Sodium 142 135 - 145 mmol/L   Potassium 3.1 (L) 3.5 - 5.1 mmol/L   Chloride 107 101 - 111 mmol/L   CO2 24 22 - 32 mmol/L   Glucose, Bld 129 (H) 65 - 99 mg/dL   BUN 6 6 - 20 mg/dL   Creatinine, Ser 0.76 0.44 - 1.00 mg/dL   Calcium 9.1 8.9 - 10.3 mg/dL   Total Protein 7.1 6.5 - 8.1 g/dL   Albumin 3.5 3.5 - 5.0 g/dL   AST 29 15 - 41 U/L   ALT 24 14 - 54 U/L   Alkaline Phosphatase 79 38 - 126 U/L   Total Bilirubin 0.1 (L) 0.3 - 1.2 mg/dL   GFR calc non Af Amer >60 >60 mL/min   GFR calc Af Amer >60 >60 mL/min    Comment: (NOTE) The eGFR has been calculated using the CKD EPI equation. This calculation has not been validated in all clinical situations. eGFR's persistently <60 mL/min signify possible Chronic Kidney Disease.    Anion gap 11 5 - 15  Ethanol     Status: None   Collection Time: 05/07/15 10:54 PM  Result Value Ref Range   Alcohol, Ethyl (B) <5 <5 mg/dL    Comment:        LOWEST DETECTABLE LIMIT FOR SERUM ALCOHOL IS 5 mg/dL FOR MEDICAL PURPOSES ONLY   Urine rapid drug screen (hosp performed)     Status: Abnormal   Collection Time: 05/08/15 12:01 AM  Result Value Ref  Range   Opiates NONE DETECTED NONE DETECTED   Cocaine POSITIVE (A) NONE DETECTED   Benzodiazepines NONE DETECTED NONE DETECTED   Amphetamines NONE DETECTED NONE DETECTED   Tetrahydrocannabinol NONE DETECTED NONE DETECTED   Barbiturates NONE DETECTED NONE DETECTED    Comment:        DRUG SCREEN FOR MEDICAL PURPOSES ONLY.  IF CONFIRMATION IS NEEDED FOR ANY PURPOSE, NOTIFY LAB WITHIN 5 DAYS.        LOWEST DETECTABLE LIMITS FOR URINE DRUG SCREEN Drug Class       Cutoff (ng/mL) Amphetamine      1000 Barbiturate      200 Benzodiazepine   562 Tricyclics       563 Opiates          300 Cocaine          300 THC              50  Blood Alcohol level:  Lab Results  Component Value Date   [facility] <5 05/07/2015   ETH <11 07/29/2011    Physical Findings: AIMS: Facial and Oral Movements Muscles of Facial Expression: None, normal Lips and Perioral Area: None, normal Jaw: None, normal Tongue: None, normal,Extremity Movements Upper (arms, wrists, hands, fingers): None, normal Lower (legs, knees, ankles, toes): None, normal, Trunk Movements Neck, shoulders, hips: None, normal, Overall Severity Severity of abnormal movements (highest score from questions above): None, normal Incapacitation due to abnormal movements: None, normal, Dental Status Current problems with teeth and/or dentures?: Yes Does patient usually wear dentures?: No  CIWA:  CIWA-Ar Total: 0 COWS:  COWS Total Score: 2  Musculoskeletal: Strength & Muscle Tone: within normal limits Gait & Station: normal Patient leans: normal  Psychiatric Specialty Exam: Review of Systems  Constitutional: Negative.   HENT: Negative.   Eyes: Negative.   Respiratory: Negative.   Cardiovascular: Negative.   Gastrointestinal: Negative.   Genitourinary: Negative.   Musculoskeletal: Negative.   Skin: Negative.   Neurological: Negative.   Endo/Heme/Allergies: Negative.   Psychiatric/Behavioral: Positive for depression and substance  abuse. The patient is nervous/anxious.     Blood pressure 96/55, pulse 89, temperature 98 F (36.7 C), temperature source Oral, resp. rate 20, height 6' (1.829 m), weight 61.689 kg (136 lb), last menstrual period 07/30/2011, SpO2 100 %.Body mass index is 18.44 kg/(m^2).  General Appearance: Fairly Groomed  Engineer, water::  Fair  Speech:  Clear and Coherent  Volume:  Decreased  Mood:  Anxious and Depressed  Affect:  Restricted  Thought Process:  Coherent and Goal Directed  Orientation:  Full (Time, Place, and Person)  Thought Content:  symptoms events worries concerns  Suicidal Thoughts:  No  Homicidal Thoughts:  No  Memory:  Immediate;   Fair Recent;   Fair Remote;   Fair  Judgement:  Fair  Insight:  Present and Shallow  Psychomotor Activity:  Decreased  Concentration:  Fair  Recall:  [employer] of Knowledge:Fair  Language: Fair  Akathisia:  No  Handed:  Right  AIMS (if indicated):     Assets:  Desire for Improvement  ADL's:  Intact  Cognition: WNL  Sleep:  Number of Hours: 6.5   Treatment Plan Summary: Daily contact with patient to assess and evaluate symptoms and progress in treatment and Medication management Supportive approach/coping skills Opiate dependence; clonidine detox protocol/work a relapse prevention plan Depression; reassess for the use of an antidepressant Back pain; will use the Robaxin more often Work with CBT/mindfulness Explore residential treatment programs Mollee Neer A, MD 05/09/2015, 3:19 PM

## 2015-05-09 NOTE — Progress Notes (Signed)
D: Patient denies SI/HI or AVH. Patient has a depressed mood and flat affect.  She states that her day has been "ok", states her appetite is improving and has been attending group.  Pt. Had a visitor on the unit tonight and is otherwise cooperative and appropriate with staff and others on the unit.  A: Patient given emotional support from RN. Patient encouraged to come to staff with concerns and/or questions. Patient's medication routine continued. Patient's orders and plan of care reviewed.   R: Patient remains appropriate and cooperative. Will continue to monitor patient q15 minutes for safety.

## 2015-05-09 NOTE — Progress Notes (Signed)
Patient did not attend the evening karaoke group. Pt was notified that group was beginning but remained in bed.    

## 2015-05-09 NOTE — [facility] Group Notes (Signed)
[facility]   05/09/2015 10:04 AM  Participation Quality:  Invited. DID NOT ATTEND. Pt chose to rest in room.   Smart, Tyarra Nolton LCSW

## 2015-05-09 NOTE — [facility] Group Notes (Signed)
[facility] LCSW Group Therapy  05/09/2015 1:12 PM  Type of Therapy:  Group Therapy  Participation Level:  Did Not Attend-pt chose to remain in bed.  Modes of Intervention:  Confrontation, Discussion, Education, Exploration, Problem-solving, Rapport Building, Socialization and Support  Summary of Progress/Problems: Feelings around Relapse. Group members discussed the meaning of relapse and shared personal stories of relapse, how it affected them and others, and how they perceived themselves during this time. Group members were encouraged to identify triggers, warning signs and coping skills used when facing the possibility of relapse. Social supports were discussed and explored in detail. Post Acute Withdrawal Syndrome (handout provided) was introduced and examined. Pt's were encouraged to ask questions, talk about key points associated with PAWS, and process this information in terms of relapse prevention.   [name], Alice Junkins LCSW 05/09/2015, 1:12 PM

## 2015-05-09 NOTE — Plan of Care (Signed)
Problem: Ineffective individual coping Goal: STG: Patient will remain free from self harm Outcome: Progressing Pt denies suicidal thoughts/self harm thoughts. Pt verbally contracts for safety.

## 2015-05-10 MED ORDER — FLUOXETINE HCL 10 MG PO CAPS
10.0000 mg | ORAL_CAPSULE | Freq: Every day | ORAL | Status: DC
  Administered 2015-05-10 – 2015-05-12 (×3): 10 mg via ORAL
  Filled 2015-05-10 (×6): qty 1

## 2015-05-10 MED ORDER — TRAZODONE HCL 100 MG PO TABS
100.0000 mg | ORAL_TABLET | Freq: Every evening | ORAL | Status: DC | PRN
  Administered 2015-05-10 – 2015-05-11 (×2): 100 mg via ORAL
  Filled 2015-05-10 (×8): qty 1

## 2015-05-10 MED ORDER — HYDROXYZINE HCL 50 MG PO TABS
50.0000 mg | ORAL_TABLET | Freq: Four times a day (QID) | ORAL | Status: DC | PRN
  Administered 2015-05-10 – 2015-05-12 (×6): 50 mg via ORAL
  Filled 2015-05-10 (×7): qty 1

## 2015-05-10 NOTE — Progress Notes (Signed)
PPD NEGATIVE ON 05/10/2015 AT 1648.

## 2015-05-10 NOTE — Plan of Care (Signed)
Problem: Consults Goal: Depression Patient Education See Patient Education Module for education specifics.  Outcome: Progressing Nurse discussed depression/coping skills with patient.        

## 2015-05-10 NOTE — Progress Notes (Signed)
D.  Pt pleasant on approach, denies complaints at this time.  Positive for evening wrap up group, interacting appropriately with peers on the unit.  Continues to endorse chronic back pain.  Denies SI/HI/hallucinations at this time.  A.  Support and encouragement offered, medication given as ordered.  R.  Pt remains safe on the unit, will continue to monitor.

## 2015-05-10 NOTE — Progress Notes (Signed)
D:  Patient denied SI and HI, contracts for safety.  Denied A/V hallucinations.  Patient has stayed in bed most of the day. A:  Medications administered per MD orders.  Emotional support and encouragement given patient. R:  Safety maintained with 15 minute checks. Patient has continually asked about her medications throughout the day.

## 2015-05-10 NOTE — [facility] Group Notes (Signed)
The focus of this group is to educate the patient on the purpose and policies of crisis stabilization and provide a format to answer questions about their admission.  The group details unit policies and expectations of patients while admitted.  Patient did not attend 0900 nurse education orientation group this morning.  Patient stayed in bed.   

## 2015-05-10 NOTE — Progress Notes (Addendum)
Adult Psychoeducational Group Note  Date:  05/10/2015 Time:  9:11 PM  Group Topic/Focus:  Wrap-Up Group:   The focus of this group is to help patients review their daily goal of treatment and discuss progress on daily workbooks.  Participation Level:  Active  Participation Quality:  Appropriate and Attentive  Affect:  Appropriate  Cognitive:  Appropriate  Insight: Appropriate and Good  Engagement in Group:  Engaged  Modes of Intervention:  Discussion  Additional Comments:  Pt stated her goal was to stay awake more today. Pt accomplished goal. Pt stated one positive thing is that she saw her mom for the first time in 2 years.   Caswell CorwinOwen, Liya Strollo C 05/10/2015, 9:11 PM

## 2015-05-10 NOTE — Progress Notes (Signed)
Pt attended AA wrap-up group and participated. 

## 2015-05-10 NOTE — Progress Notes (Signed)
[facility] MD Progress Note  05/10/2015 12:58 PM Alice [name]  MRN:  [id number] Subjective: Patient reports " I think I need a antidepressant, I am sad, depressed and I have anxiety all the time."  Objective:Alice [name] is awake, alert and oriented X3, patient seen sitting in day room interacting with peers. Denies suicidal or homicidal ideation. Denies auditory or visual hallucination and does not appear to be responding to internal stimuli. Patient interacts well with staff and others. Patient reports she is medication compliant without mediation side effects.  States her depression 8/10.  States she is having high anxiety all the time. Patient states "I am just not feeling not myself today."  Patient reports fair appetite. States she is not resting well throughout the night. Patient reports she is hopeful to get into a residential treatment program. Support, encouragement and reassurance was provided.   Principal Problem: Severe recurrent major depression without psychotic features (HCC) Diagnosis:   Patient Active Problem List   Diagnosis Date Noted  . Substance induced mood disorder (HCC) [F19.94] 05/08/2015  . Severe recurrent major depression without psychotic features (HCC) [F33.2] 05/08/2015  . Nicotine abuse [F19.10] 10/30/2013  . Opiate dependence (HCC) [F11.20] 07/30/2011    Class: Acute  . CHEST PAIN, PRECORDIAL [R07.2] 02/18/2010  . ANEMIA [D64.9] 12/05/2009  . ANXIETY [F41.1] 12/05/2009  . TOBACCO ABUSE [F17.200] 12/05/2009  . PALPITATIONS [R00.2] 12/05/2009   Total Time spent with patient: 20 minutes  Past Psychiatric History: see admission H and P  Past Medical History:  Past Medical History  Diagnosis Date  . Anemia   . Heart murmur   . Hypertension   . Chronic low back pain     Past Surgical History  Procedure Laterality Date  . Back surgery      hardware in back  . Abdominal hysterectomy      abd?  . Tumor reomoved from stomach    . Cyst removed from ovary    .  Removal of bb in finger  2008  . Salivary gland surgery    . Fracture surgery      LUMBAR SPINE   Family History:  Family History  Problem Relation Age of Onset  . Hypertension Mother   . Cancer Father    Family Psychiatric  History: see admission H and P Social History:  History  Alcohol Use No     History  Drug Use  . Yes  . Special: Heroin, "Crack" cocaine, Oxycodone    Social History   Social History  . Marital Status: Widowed    Spouse Name: N/A  . Number of Children: N/A  . Years of Education: N/A   Social History Main Topics  . Smoking status: Current Every Day Smoker -- 0.50 packs/day  . Smokeless tobacco: None  . Alcohol Use: No  . Drug Use: Yes    Special: Heroin, "Crack" cocaine, Oxycodone  . Sexual Activity: No   Other Topics Concern  . None   Social History Narrative   Single, 2 children; disabled.    Additional Social History:    Pain Medications: roxi's Prescriptions: see pta med list History of alcohol / drug use?: Yes Longest period of sobriety (when/how long): 24 years Negative Consequences of Use: Personal relationships, Financial Withdrawal Symptoms: Sweats, Tremors Name of Substance 1: Opana  1 - Age of First Use: 48 1 - Amount (size/oz): 1to 5 40 mg  1 - Frequency: daily  1 - Duration: 2 years 1 - Last Use / Amount:  05-06-15 Name of Substance 2: Roxy's  2 - Age of First Use: 48 2 - Amount (size/oz): 15-80mg s  2 - Frequency: daily  2 - Duration: 2 years  2 - Last Use / Amount: 05-06-15 Name of Substance 3: Heroin  3 - Age of First Use: 14 3 - Amount (size/oz): $40 3 - Frequency: daily (multiple times) $40 per time  3 - Duration: 2 years 3 - Last Use / Amount: 05-06-15 Name of Substance 4: Crack/Cocaine  4 - Age of First Use: 21 4 - Amount (size/oz): $500 4 - Frequency: daily  4 - Duration: 2 years  4 - Last Use / Amount: 05-06-15 Name of Substance 5: Suboxone  5 - Age of First Use: 49 5 - Amount (size/oz): "a strip"  5 -  Frequency: 1 to 2 times monthly  5 - Duration: 1 year  Name of Substance 6: Oxycotin  6 - Age of First Use: 48 6 - Amount (size/oz): 3-30 mg  6 - Frequency: 1-2 monthly  6 - Duration: 2 years  6 - Last Use / Amount: 05-06-15        Sleep: Fair  Appetite:  Fair  Current Medications: Current Facility-Administered Medications  Medication Dose Route Frequency Provider Last Rate Last Dose  . acetaminophen (TYLENOL) tablet 650 mg  650 mg Oral Q6H PRN Kerry HoughSpencer E Simon, PA-C      . alum & mag hydroxide-simeth (MAALOX/MYLANTA) 200-200-20 MG/5ML suspension 30 mL  30 mL Oral Q4H PRN Kerry HoughSpencer E Simon, PA-C      . cloNIDine (CATAPRES) tablet 0.1 mg  0.1 mg Oral BH-qamhs Spencer E Simon, PA-C   0.1 mg at 05/10/15 0758   Followed by  . [START ON 05/12/2015] cloNIDine (CATAPRES) tablet 0.1 mg  0.1 mg Oral QAC breakfast Kerry HoughSpencer E Simon, PA-C      . dicyclomine (BENTYL) tablet 20 mg  20 mg Oral Q6H PRN Kerry HoughSpencer E Simon, PA-C   20 mg at 05/08/15 2020  . feeding supplement (ENSURE ENLIVE) (ENSURE ENLIVE) liquid 237 mL  237 mL Oral TID BM Rachael FeeIrving A Lugo, MD   237 mL at 05/10/15 0759  . FLUoxetine (PROZAC) capsule 10 mg  10 mg Oral Daily Oneta Rackanika N Lewis, NP   10 mg at 05/10/15 1158  . hydrOXYzine (ATARAX/VISTARIL) tablet 50 mg  50 mg Oral Q6H PRN Oneta Rackanika N Lewis, NP   50 mg at 05/10/15 1158  . lisinopril (PRINIVIL,ZESTRIL) tablet 10 mg  10 mg Oral Daily Kerry HoughSpencer E Simon, PA-C   10 mg at 05/10/15 0758  . loperamide (IMODIUM) capsule 2-4 mg  2-4 mg Oral PRN Kerry HoughSpencer E Simon, PA-C      . magnesium hydroxide (MILK OF MAGNESIA) suspension 30 mL  30 mL Oral Daily PRN Kerry HoughSpencer E Simon, PA-C      . methocarbamol (ROBAXIN) tablet 500 mg  500 mg Oral Q6H PRN Rachael FeeIrving A Lugo, MD   500 mg at 05/10/15 1105  . naproxen (NAPROSYN) tablet 500 mg  500 mg Oral BID PRN Kerry HoughSpencer E Simon, PA-C   500 mg at 05/10/15 1057  . nitrofurantoin (macrocrystal-monohydrate) (MACROBID) capsule 100 mg  100 mg Oral Q12H Rachael FeeIrving A Lugo, MD   100 mg at  05/10/15 0759  . ondansetron (ZOFRAN-ODT) disintegrating tablet 4 mg  4 mg Oral Q6H PRN Kerry HoughSpencer E Simon, PA-C      . traZODone (DESYREL) tablet 100 mg  100 mg Oral QHS,MR X 1 Oneta Rackanika N Lewis, NP      .  tuberculin injection 5 Units  5 Units Intradermal Once Rachael Fee, MD   5 Units at 05/08/15 1718    Lab Results:  No results found for this or any previous visit (from the past 48 hour(s)).  Blood Alcohol level:  Lab Results  Component Value Date   [facility] <5 05/07/2015   ETH <11 07/29/2011    Physical Findings: AIMS: Facial and Oral Movements Muscles of Facial Expression: None, normal Lips and Perioral Area: None, normal Jaw: None, normal Tongue: None, normal,Extremity Movements Upper (arms, wrists, hands, fingers): None, normal Lower (legs, knees, ankles, toes): None, normal, Trunk Movements Neck, shoulders, hips: None, normal, Overall Severity Severity of abnormal movements (highest score from questions above): None, normal Incapacitation due to abnormal movements: None, normal, Dental Status Current problems with teeth and/or dentures?: Yes Does patient usually wear dentures?: No  CIWA:  CIWA-Ar Total: 0 COWS:  COWS Total Score: 2  Musculoskeletal: Strength & Muscle Tone: within normal limits Gait & Station: normal Patient leans: normal  Psychiatric Specialty Exam: Review of Systems  Constitutional: Negative.   HENT: Negative.   Eyes: Negative.   Respiratory: Negative.   Cardiovascular: Negative.   Gastrointestinal: Negative.   Genitourinary: Negative.   Musculoskeletal: Negative.   Skin: Negative.   Neurological: Negative.   Endo/Heme/Allergies: Negative.   Psychiatric/Behavioral: Positive for depression and substance abuse. The patient is nervous/anxious.   All other systems reviewed and are negative.   Blood pressure 99/73, pulse 89, temperature 98 F (36.7 C), temperature source Oral, resp. rate 16, height 6' (1.829 m), weight 61.689 kg (136 lb), last  menstrual period 07/30/2011, SpO2 100 %.Body mass index is 18.44 kg/(m^2).  General Appearance: Casual and Fairly Groomed  Patent attorney::  Fair  Speech:  Clear and Coherent  Volume:  Decreased  Mood:  Anxious and Depressed  Affect:  Restricted  Thought Process:  Coherent and Goal Directed  Orientation:  Full (Time, Place, and Person)  Thought Content:  symptoms events worries concerns  Suicidal Thoughts:  No  Homicidal Thoughts:  No  Memory:  Immediate;   Fair Recent;   Fair Remote;   Fair  Judgement:  Fair  Insight:  Present and Shallow  Psychomotor Activity:  Restlessness  Concentration:  Fair  Recall:  [employer] of Knowledge:Fair  Language: Fair  Akathisia:  No  Handed:  Right  AIMS (if indicated):     Assets:  Desire for Improvement  ADL's:  Intact  Cognition: WNL  Sleep:  Number of Hours: 6.25     I agree with current treatment plan on 05/10/2015, Patient seen face-to-face for psychiatric evaluation follow-up, chart reviewed. Reviewed the information documented and agree with the treatment plan.  Treatment Plan Summary: Daily contact with patient to assess and evaluate symptoms and progress in treatment and Medication management Supportive approach/coping skills Started Prozac 10 mg PO QD for depression Increased trazodone 100 mg PO QHS for insomnia  Start vistaril 50 mg PO Q 6 PRN for aniety Orders Placed: Labs; TSH placed  Opiate dependence; clonidine detox protocol/work a relapse prevention plan Depression; reassess for the use of an antidepressant Back pain; will use the Robaxin more often Work with CBT/mindfulness Explore residential treatment programs  Oneta Rack, NP 05/10/2015, 12:58 PM

## 2015-05-10 NOTE — [facility] Group Notes (Addendum)
[facility] Group Notes:  (Clinical Social Work)   11/09/2014     10:00-11:00AM  Summary of Progress/Problems:   In today's process group a decisional balance exercise was used to explore in depth the perceived benefits and costs of alcohol and drugs, as well as the  benefits and costs of replacing these with healthy coping skills.  Patients listed healthy and unhealthy coping techniques, determining with CSW guidance that unhealthy coping techniques work initially, but eventually become harmful.  Motivational Interviewing and the whiteboard were utilized for the exercises.  The patient expressed that the unhealthy coping she often uses is drugs while the healthy coping she sometimes uses calling family.  She seemed very lethargic and ended up leaving group after only about 1/2 hour.  Type of Therapy:  Group Therapy - Process   Participation Level:  Active  Participation Quality:  Attentive  Affect:  Flat and Lethargic  Cognitive:  Oriented  Insight:  Improving  Engagement in Therapy:  Limited  Modes of Intervention:  Education, Motivational Interviewing  Ambrose MantleMareida Grossman-Orr, LCSW 05/10/2015, 12:26 PM

## 2015-05-11 LAB — TSH: TSH: 1.574 u[IU]/mL (ref 0.350–4.500)

## 2015-05-11 NOTE — Progress Notes (Signed)
Pt did not attend AA meeting this evening.  

## 2015-05-11 NOTE — [facility] Group Notes (Signed)
[facility] Group Notes: (Clinical Social Work)   05/11/2015      Type of Therapy:  Group Therapy   Participation Level:  Did Not Attend despite MHT prompting - she arrived about halfway through group, stayed about 5 minutes without talking, and left again.  Ambrose MantleMareida Grossman-Orr, LCSW 05/11/2015, 12:35 PM

## 2015-05-11 NOTE — Progress Notes (Signed)
Patient has continually asked about her pain medications throughout the day.  Patient stayed in bed most of morning.  Has sat in the dayroom late afternoon, talking and laughing with peers.  No signs/symptoms of pain/distress noted on patient's face/body movements.  Respirations even and unlabored.

## 2015-05-11 NOTE — Progress Notes (Signed)
D:  Patient denied SI and HI, contracts for safety.  Denied A/V hallucinations.  Patient continues to ask for medications, stated her pain level decreases very small amount after pain medications. A:  Medications administered per MD orders.  Emotional support and encouragement given patient. R:  Safety maintained with 15 minute checks.

## 2015-05-11 NOTE — Progress Notes (Signed)
[facility]  05/11/2015 10:31 AM Alice [name]  MRN:  [id number] Subjective: Patient reports ' I am stressed because I don't have a discharge plan."   Objective:Alice [name] is awake, alert and oriented X3, patient seen sitting in bedroom. Patient reports she is nauseated and is vomiting for withdrawals. Denies suicidal or homicidal ideation. Denies auditory or visual hallucination and does not appear to be responding to internal stimuli. Patient reports she  interacts well with staff and others. Patient reports she is medication compliant without mediation side effects.  States her depression 8/10.   States she is having high anxiety all the time and I need to talk to the social worker, but I don't want to attend group session. Patient reports fair appetite. States she rested a little better last night. Support, encouragement and reassurance was provided.   Principal Problem: Severe recurrent major depression without psychotic features (HCC) Diagnosis:   Patient Active Problem List   Diagnosis Date Noted  . Substance induced mood disorder (HCC) [F19.94] 05/08/2015  . Severe recurrent major depression without psychotic features (HCC) [F33.2] 05/08/2015  . Nicotine abuse [F19.10] 10/30/2013  . Opiate dependence (HCC) [F11.20] 07/30/2011    Class: Acute  . CHEST PAIN, PRECORDIAL [R07.2] 02/18/2010  . ANEMIA [D64.9] 12/05/2009  . ANXIETY [F41.1] 12/05/2009  . TOBACCO ABUSE [F17.200] 12/05/2009  . PALPITATIONS [R00.2] 12/05/2009   Total Time spent with patient: 20 minutes  Past Psychiatric History: see admission H and P  Past Medical History:  Past Medical History  Diagnosis Date  . Anemia   . Heart murmur   . Hypertension   . Chronic low back pain     Past Surgical History  Procedure Laterality Date  . Back surgery      hardware in back  . Abdominal hysterectomy      abd?  . Tumor reomoved from stomach    . Cyst removed from ovary    . Removal of bb in finger  2008  .  Salivary gland surgery    . Fracture surgery      LUMBAR SPINE   Family History:  Family History  Problem Relation Age of Onset  . Hypertension Mother   . Cancer Father    Family Psychiatric  History: see admission H and P Social History:  History  Alcohol Use No     History  Drug Use  . Yes  . Special: Heroin, "Crack" cocaine, Oxycodone    Social History   Social History  . Marital Status: Widowed    Spouse Name: N/A  . Number of Children: N/A  . Years of Education: N/A   Social History Main Topics  . Smoking status: Current Every Day Smoker -- 0.50 packs/day  . Smokeless tobacco: None  . Alcohol Use: No  . Drug Use: Yes    Special: Heroin, "Crack" cocaine, Oxycodone  . Sexual Activity: No   Other Topics Concern  . None   Social History Narrative   Single, 2 children; disabled.    Additional Social History:    Pain Medications: roxi's Prescriptions: see pta med list History of alcohol / drug use?: Yes Longest period of sobriety (when/how long): 24 years Negative Consequences of Use: Personal relationships, Financial Withdrawal Symptoms: Sweats, Tremors Name of Substance 1: Opana  1 - Age of First Use: 48 1 - Amount (size/oz): 1to 5 40 mg  1 - Frequency: daily  1 - Duration: 2 years 1 - Last Use / Amount: 05-06-15 Name of  Substance 2: Roxy's  2 - Age of First Use: 48 2 - Amount (size/oz): 15-80mg s  2 - Frequency: daily  2 - Duration: 2 years  2 - Last Use / Amount: 05-06-15 Name of Substance 3: Heroin  3 - Age of First Use: 14 3 - Amount (size/oz): $40 3 - Frequency: daily (multiple times) $40 per time  3 - Duration: 2 years 3 - Last Use / Amount: 05-06-15 Name of Substance 4: Crack/Cocaine  4 - Age of First Use: 21 4 - Amount (size/oz): $500 4 - Frequency: daily  4 - Duration: 2 years  4 - Last Use / Amount: 05-06-15 Name of Substance 5: Suboxone  5 - Age of First Use: 49 5 - Amount (size/oz): "a strip"  5 - Frequency: 1 to 2 times monthly  5  - Duration: 1 year  Name of Substance 6: Oxycotin  6 - Age of First Use: 48 6 - Amount (size/oz): 3-30 mg  6 - Frequency: 1-2 monthly  6 - Duration: 2 years  6 - Last Use / Amount: 05-06-15        Sleep: Fair  Appetite:  Fair  Current Medications: Current Facility-Administered Medications  Medication Dose Route Frequency Provider Last Rate Last Dose  . acetaminophen (TYLENOL) tablet 650 mg  650 mg Oral Q6H PRN Kerry Hough, PA-C   650 mg at 05/10/15 1651  . alum & mag hydroxide-simeth (MAALOX/MYLANTA) 200-200-20 MG/5ML suspension 30 mL  30 mL Oral Q4H PRN Kerry Hough, PA-C      . cloNIDine (CATAPRES) tablet 0.1 mg  0.1 mg Oral BH-qamhs Spencer E Simon, PA-C   0.1 mg at 05/11/15 1610   Followed by  . [START ON 05/12/2015] cloNIDine (CATAPRES) tablet 0.1 mg  0.1 mg Oral QAC breakfast Kerry Hough, PA-C      . dicyclomine (BENTYL) tablet 20 mg  20 mg Oral Q6H PRN Kerry Hough, PA-C   20 mg at 05/08/15 2020  . feeding supplement (ENSURE ENLIVE) (ENSURE ENLIVE) liquid 237 mL  237 mL Oral TID BM Rachael Fee, MD   237 mL at 05/10/15 1951  . FLUoxetine (PROZAC) capsule 10 mg  10 mg Oral Daily Oneta Rack, NP   10 mg at 05/11/15 0833  . hydrOXYzine (ATARAX/VISTARIL) tablet 50 mg  50 mg Oral Q6H PRN Oneta Rack, NP   50 mg at 05/11/15 9604  . lisinopril (PRINIVIL,ZESTRIL) tablet 10 mg  10 mg Oral Daily Kerry Hough, PA-C   10 mg at 05/11/15 0834  . loperamide (IMODIUM) capsule 2-4 mg  2-4 mg Oral PRN Kerry Hough, PA-C      . magnesium hydroxide (MILK OF MAGNESIA) suspension 30 mL  30 mL Oral Daily PRN Kerry Hough, PA-C      . methocarbamol (ROBAXIN) tablet 500 mg  500 mg Oral Q6H PRN Rachael Fee, MD   500 mg at 05/11/15 5409  . naproxen (NAPROSYN) tablet 500 mg  500 mg Oral BID PRN Kerry Hough, PA-C   500 mg at 05/10/15 1057  . nitrofurantoin (macrocrystal-monohydrate) (MACROBID) capsule 100 mg  100 mg Oral Q12H Rachael Fee, MD   100 mg at 05/11/15 0834  .  ondansetron (ZOFRAN-ODT) disintegrating tablet 4 mg  4 mg Oral Q6H PRN Kerry Hough, PA-C      . traZODone (DESYREL) tablet 100 mg  100 mg Oral QHS,MR X 1 Oneta Rack, NP   100 mg at  05/10/15 2104    Lab Results:  Results for orders placed or performed during the hospital encounter of 05/08/15 (from the past 48 hour(s))  TSH     Status: None   Collection Time: 05/11/15  6:16 AM  Result Value Ref Range   TSH 1.574 0.350 - 4.500 uIU/mL    Comment: Performed at [facility]    Blood Alcohol level:  Lab Results  Component Value Date   [facility] <5 05/07/2015   ETH <11 07/29/2011    Physical Findings: AIMS: Facial and Oral Movements Muscles of Facial Expression: None, normal Lips and Perioral Area: None, normal Jaw: None, normal Tongue: None, normal,Extremity Movements Upper (arms, wrists, hands, fingers): None, normal Lower (legs, knees, ankles, toes): None, normal, Trunk Movements Neck, shoulders, hips: None, normal, Overall Severity Severity of abnormal movements (highest score from questions above): None, normal Incapacitation due to abnormal movements: None, normal Patient's awareness of abnormal movements (rate only patient's report): No Awareness, Dental Status Current problems with teeth and/or dentures?: Yes Does patient usually wear dentures?: No  CIWA:  CIWA-Ar Total: 1 COWS:  COWS Total Score: 1  Musculoskeletal: Strength & Muscle Tone: within normal limits Gait & Station: normal Patient leans: normal  Psychiatric Specialty Exam: Review of Systems  Constitutional: Negative.   HENT: Negative.   Eyes: Negative.   Respiratory: Negative.   Cardiovascular: Negative.   Gastrointestinal: Negative.   Genitourinary: Negative.   Musculoskeletal: Negative.   Skin: Negative.   Neurological: Negative.   Endo/Heme/Allergies: Negative.   Psychiatric/Behavioral: Positive for depression and substance abuse. The patient is nervous/anxious.   All other  systems reviewed and are negative.   Blood pressure 104/72, pulse 79, temperature 98 F (36.7 C), temperature source Oral, resp. rate 16, height 6' (1.829 m), weight 61.689 kg (136 lb), last menstrual period 07/30/2011, SpO2 100 %.Body mass index is 18.44 kg/(m^2).  General Appearance: Casual and Fairly Groomed  Patent attorney::  Fair  Speech:  Clear and Coherent  Volume:  Decreased  Mood:  Anxious, Depressed and Irritable  Affect:  Restricted  Thought Process:  Coherent and Goal Directed  Orientation:  Full (Time, Place, and Person)  Thought Content:  symptoms events worries concerns  Suicidal Thoughts:  No  Homicidal Thoughts:  No  Memory:  Immediate;   Fair Recent;   Fair Remote;   Fair  Judgement:  Fair  Insight:  Present  Psychomotor Activity:  Restlessness  Concentration:  Fair  Recall:  [employer] of Knowledge:Fair  Language: Fair  Akathisia:  No  Handed:  Right  AIMS (if indicated):     Assets:  Desire for Improvement  ADL's:  Intact  Cognition: WNL  Sleep:  Number of Hours: 6.75     I agree with current treatment plan on 05/10/2015, Patient seen face-to-face for psychiatric evaluation follow-up, chart reviewed. Reviewed the information documented and agree with the treatment plan.  Treatment Plan Summary: Daily contact with patient to assess and evaluate symptoms and progress in treatment and Medication management Supportive approach/coping skills Continue Prozac 10 mg PO QD for depression Continue trazodone 100 mg PO QHS for insomnia  continue vistaril 50 mg PO Q 6 PRN for anxiety Orders Placed: Labs; TSH (WNL) Opiate dependence; clonidine detox protocol/work a relapse prevention plan Depression; reassess for the use of an antidepressant Back pain; will use the Robaxin more often Work with CBT/mindfulness Explore residential treatment programs  Oneta Rack, NP 05/11/2015, 10:31 AM

## 2015-05-11 NOTE — Plan of Care (Signed)
Problem: Consults Goal: Anxiety Disorder Patient Education See Patient Education Module for eduction specifics.  Outcome: Progressing Nurse discussed anxiety/coping skills with patient.        

## 2015-05-11 NOTE — [facility] Group Notes (Signed)
The focus of this group is to educate the patient on the purpose and policies of crisis stabilization and provide a format to answer questions about their admission.  The group details unit policies and expectations of patients while admitted.  Patient did not attend 0900 nurse education orientation group this morning.  Patient stayed in bed.   

## 2015-05-12 MED ORDER — QUETIAPINE FUMARATE 100 MG PO TABS
100.0000 mg | ORAL_TABLET | Freq: Every day | ORAL | Status: DC
  Filled 2015-05-12 (×2): qty 1

## 2015-05-12 MED ORDER — QUETIAPINE FUMARATE 100 MG PO TABS: 100.0000 mg | ORAL_TABLET | Freq: Every day | ORAL | Status: DC

## 2015-05-12 MED ORDER — TRAMADOL HCL 50 MG PO TABS
50.0000 mg | ORAL_TABLET | Freq: Four times a day (QID) | ORAL | Status: DC | PRN
Start: 2015-05-12 — End: 2015-05-12
  Administered 2015-05-12: 50 mg via ORAL
  Filled 2015-05-12: qty 1

## 2015-05-12 MED ORDER — LISINOPRIL 5 MG PO TABS: 10.0000 mg | ORAL_TABLET | Freq: Every day | ORAL | Status: DC

## 2015-05-12 MED ORDER — FLUOXETINE HCL 10 MG PO CAPS: 10.0000 mg | ORAL_CAPSULE | Freq: Every day | ORAL | Status: DC

## 2015-05-12 MED ORDER — METHOCARBAMOL 500 MG PO TABS: 500.0000 mg | ORAL_TABLET | Freq: Four times a day (QID) | ORAL | Status: DC | PRN

## 2015-05-12 MED ORDER — HYDROXYZINE HCL 50 MG PO TABS: 50.0000 mg | ORAL_TABLET | Freq: Four times a day (QID) | ORAL | Status: DC | PRN

## 2015-05-12 MED ORDER — TRAZODONE HCL 100 MG PO TABS: 100.0000 mg | ORAL_TABLET | Freq: Every evening | ORAL | Status: DC | PRN

## 2015-05-12 MED ORDER — NITROFURANTOIN MONOHYD MACRO 100 MG PO CAPS: 100.0000 mg | ORAL_CAPSULE | Freq: Two times a day (BID) | ORAL | Status: DC

## 2015-05-12 NOTE — [facility] Suicide Risk Assessment (Signed)
[facility] INPATIENT:  Family/Significant Other Suicide Prevention Education  Suicide Prevention Education:  Education Completed; Alice [name] (pt's sister) has been identified by the patient as the family member/significant other with whom the patient will be residing, and identified as the person(s) who will aid the patient in the event of a mental health crisis (suicidal ideations/suicide attempt).  With written consent from the patient, the family member/significant other has been provided the following suicide prevention education, prior to the and/or following the discharge of the patient.  The suicide prevention education provided includes the following:  Suicide risk factors  Suicide prevention and interventions  National Suicide Hotline telephone number  [facility] assessment telephone number  [facility] Emergency Assistance 911  [facility] and/or Residential Mobile Crisis Unit telephone number  Request made of family/significant other to:  Remove weapons (e.g., guns, rifles, knives), all items previously/currently identified as safety concern.    Remove drugs/medications (over-the-counter, prescriptions, illicit drugs), all items previously/currently identified as a safety concern.  The family member/significant other verbalizes understanding of the suicide prevention education information provided.  The family member/significant other agrees to remove the items of safety concern listed above.  Alice [name], Alice Schweppe LCSW 05/12/2015, 12:23 PM

## 2015-05-12 NOTE — Plan of Care (Signed)
Problem: Alteration in mood; excessive anxiety as evidenced by: Goal: LTG-Patient's behavior demonstrates decreased anxiety (Patient's behavior demonstrates anxiety and he/she is utilizing learned coping skills to deal with anxiety-producing situations)  Outcome: Progressing Nurse discussed anxiety/depression/coping skills with patient.

## 2015-05-12 NOTE — Progress Notes (Signed)
EKG COMPLETED PER MD ORDER.  

## 2015-05-12 NOTE — Discharge Summary (Signed)
Physician Discharge Summary Note  Patient:  Alice [name] is an 50 y.o., female MRN:  [id number] DOB:  [birth year] Patient phone:  [phone] (home)  Patient address:   [address] [place] [address],  Total Time spent with patient: Greater than 30 minutes  Date of Admission:  05/08/2015 Date of Discharge: 05-12-15  Reason for Admission:  Polysubstance dependence/worsening symptoms of depression.  Principal Problem: Severe recurrent major depression without psychotic features [facility])  Discharge Diagnoses: Patient Active Problem List   Diagnosis Date Noted  . Opioid dependence with withdrawal (HCC) [F11.23]   . Substance induced mood disorder (HCC) [F19.94] 05/08/2015  . Severe recurrent major depression without psychotic features (HCC) [F33.2] 05/08/2015  . Nicotine abuse [F19.10] 10/30/2013  . Opiate dependence (HCC) [F11.20] 07/30/2011    Class: Acute  . CHEST PAIN, PRECORDIAL [R07.2] 02/18/2010  . ANEMIA [D64.9] 12/05/2009  . ANXIETY [F41.1] 12/05/2009  . TOBACCO ABUSE [F17.200] 12/05/2009  . PALPITATIONS [R00.2] 12/05/2009   Past Psychiatric History: Major depression, opioid use disorder  Past Medical History:  Past Medical History  Diagnosis Date  . Anemia   . Heart murmur   . Hypertension   . Chronic low back pain     Past Surgical History  Procedure Laterality Date  . Back surgery      hardware in back  . Abdominal hysterectomy      abd?  . Tumor reomoved from stomach    . Cyst removed from ovary    . Removal of bb in finger  2008  . Salivary gland surgery    . Fracture surgery      LUMBAR SPINE   Family History:  Family History  Problem Relation Age of Onset  . Hypertension Mother   . Cancer Father    Family Psychiatric  History: See H&P  Social History:  History  Alcohol Use No     History  Drug Use  . Yes  . Special: Heroin, "Crack" cocaine, Oxycodone    Social History   Social History  . Marital Status: Widowed    Spouse Name: N/A   . Number of Children: N/A  . Years of Education: N/A   Social History Main Topics  . Smoking status: Current Every Day Smoker -- 0.50 packs/day  . Smokeless tobacco: None  . Alcohol Use: No  . Drug Use: Yes    Special: Heroin, "Crack" cocaine, Oxycodone  . Sexual Activity: No   Other Topics Concern  . None   Social History Narrative   Single, 2 children; disabled.    Hospital Course: 50 Y/O female who states she is here because she is using lots of drugs and wants to get off. Heroin cocaine crack, Opanas, Oxy, Suboxone strips. Has been using every day for the last 2 years. "Clean" for 24 years. The main stressor was that her Husband died. She has not been able to get herself together. She was diagnosed with cancer and she was given medication that made her hair start falling out. She quit it and did not go back to treatment.  Vonzella was admitted to the [facility] adult unit with her UDS test results positive for Cocaine. She did admit to having been using all kinds of drug that included; Opana, Oxy, Suboxen strip & Cocaine for the past 2 years. She reported as the reason for her drug use as; the death of her husband & being diagnosed with cancer. She stated that she quit her treatment for the cancer because the treatment  made her hair fall out. Hien was also presenting with symptoms of depression, possibly substance induced. She was in need of opioid detox as well as mood stabilization treatments. After admission assessment/ evaluation, her presenting symptoms were identified. The medication regimen targeting those symptoms were discussed & initiated. She received Clonidine detoxification treatment protocols to combat the withdrawal symptoms of opioid. She was also medicated & discharged on; Fluoxetine 10 mg for depression, Hydroxyzine 50 mg for anxiety, Seroquel 100 mg for mood control & Trazodone 100 mg for insomnia. She was also enrolled & participated in the group counseling sessions being  offered & held on this unit. She learned coping skills that should help her further to cope better & manage her depression/substance abuse issues after discharge. Part of her treatment regimen & discharge plans are a placement to a long term substance abuse treatment center for further substance abuse treatment & an outpatient psychiatriclinic for medication management.  Alizae has completed detox treatment & her mood is now stable. This is evidenced by her reports of improved mood, absence of suicidal ideations & or substance withdrawal symptoms. She is currently being discharged to continue further substance abuse treatment as noted above. She has referrals to [facility] & the [facility] as well. She is provided with all the pertinent information needed to make these appointment without problems.  Upon discharge, Ashlyn adamantly denies any SIHI, AVH, delusional thoughts, paranoia or substance withdrawal symptoms. She is provided with a  days worth, supply samples of her [facility] discharge medications. She left [facility] with all personal belongings in no apparent distress. Transportation per sister.  Physical Findings: AIMS: Facial and Oral Movements Muscles of Facial Expression: None, normal Lips and Perioral Area: None, normal Jaw: None, normal Tongue: None, normal,Extremity Movements Upper (arms, wrists, hands, fingers): None, normal Lower (legs, knees, ankles, toes): None, normal, Trunk Movements Neck, shoulders, hips: None, normal, Overall Severity Severity of abnormal movements (highest score from questions above): None, normal Incapacitation due to abnormal movements: None, normal Patient's awareness of abnormal movements (rate only patient's report): No Awareness, Dental Status Current problems with teeth and/or dentures?: Yes Does patient usually wear dentures?: No  CIWA:  CIWA-Ar Total: 1 COWS:  COWS Total Score: 2  Musculoskeletal: Strength & Muscle Tone: within normal limits Gait & Station:  normal Patient leans: N/A  Psychiatric Specialty Exam: Review of Systems  Constitutional: Negative.   HENT: Negative.   Eyes: Negative.   Respiratory: Negative.   Cardiovascular: Negative.   Gastrointestinal: Negative.   Genitourinary: Negative.   Musculoskeletal: Negative.   Skin: Negative.   Neurological: Negative.   Endo/Heme/Allergies: Negative.   Psychiatric/Behavioral: Positive for depression (Stable) and substance abuse (Opioid use disorder). Negative for suicidal ideas, hallucinations and memory loss. The patient has insomnia (Stable). The patient is not nervous/anxious.     Blood pressure 92/47, pulse 81, temperature 98.5 F (36.9 C), temperature source Oral, resp. rate 17, height 6' (1.829 m), weight 61.689 kg (136 lb), last menstrual period 07/30/2011, SpO2 100 %.Body mass index is 18.44 kg/(m^2).  See Md's SRA  Have you used any form of tobacco in the last 30 days? (Cigarettes, Smokeless Tobacco, Cigars, and/or Pipes): Yes  Has this patient used any form of tobacco in the last 30 days? (Cigarettes, Smokeless Tobacco, Cigars, and/or Pipes) Yes, No  Blood Alcohol level:  Lab Results  Component Value Date   [facility] <5 05/07/2015   ETH <11 07/29/2011   Metabolic Disorder Labs:  No results found for: HGBA1C, MPG No  results found for: PROLACTIN No results found for: CHOL, TRIG, HDL, CHOLHDL, VLDL, LDLCALC  See Psychiatric Specialty Exam and Suicide Risk Assessment completed by Attending Physician prior to discharge.  Discharge destination:  Home  Is patient on multiple antipsychotic therapies at discharge:  No   Has Patient had three or more failed trials of antipsychotic monotherapy by history:  No  Recommended Plan for Multiple Antipsychotic Therapies: NA    Medication List    STOP taking these medications        estradiol 1 MG tablet  Commonly known as:  ESTRACE     hydrOXYzine 25 MG capsule  Commonly known as:  VISTARIL     meloxicam 15 MG tablet   Commonly known as:  MOBIC     phenazopyridine 200 MG tablet  Commonly known as:  PYRIDIUM     sulfamethoxazole-trimethoprim 800-160 MG per tablet  Commonly known as:  BACTRIM DS      TAKE these medications      Indication   FLUoxetine 10 MG capsule  Commonly known as:  PROZAC  Take 1 capsule (10 mg total) by mouth daily. For depression   Indication:  Major Depressive Disorder     hydrOXYzine 50 MG tablet  Commonly known as:  ATARAX/VISTARIL  Take 1 tablet (50 mg total) by mouth every 6 (six) hours as needed for anxiety.   Indication:  Anxiety     lisinopril 5 MG tablet  Commonly known as:  PRINIVIL,ZESTRIL  Take 2 tablets (10 mg total) by mouth daily. For high blood pressure.   Indication:  High Blood Pressure     methocarbamol 500 MG tablet  Commonly known as:  ROBAXIN  Take 1 tablet (500 mg total) by mouth every 6 (six) hours as needed for muscle spasms.   Indication:  Musculoskeletal Pain     nitrofurantoin (macrocrystal-monohydrate) 100 MG capsule  Commonly known as:  MACROBID  Take 1 capsule (100 mg total) by mouth every 12 (twelve) hours. For urinary tract infection   Indication:  Urinary Tract Infection     QUEtiapine 100 MG tablet  Commonly known as:  SEROQUEL  Take 1 tablet (100 mg total) by mouth at bedtime. For mood control   Indication:  Mood control     traZODone 100 MG tablet  Commonly known as:  DESYREL  Take 1 tablet (100 mg total) by mouth at bedtime and may repeat dose one time if needed. For sleep   Indication:  Trouble Sleeping       Follow-up Information    Follow up with [facility].   Why:  Referral faxed: 05/08/15. Patient is on waitlist and must call daily at [facility]    Contact information:   [address] [place], [place] [address] Phone: [phone] Fax: [phone]      [facility] with [facility].   Why:  Referral faxed: 05/08/15. Please call Olegario MessierKathy to check status of waitlist. Thank you.    Contact information:   [address] [place], [place] [address] Phone: [phone] Fax: [phone]      [facility] [place] On 05/13/2015.   Why:  Appt on this date at 9:00AM. Please bring: Photo ID/Drivers License, Psychologist, forensicocial Security Card, Proof of income if you have any.    Contact information:   [address] [place], [place] [address] Phone: [phone] Fax: [phone]     Follow-up recommendations: Activity:  As tolerated Diet: As recommended by your primary care doctor. Keep all scheduled  follow-up appointments as recommended.   Comments: Take all your medications as prescribed by your mental healthcare provider. Report any adverse effects and or reactions from your medicines to your outpatient provider promptly. Patient is instructed and cautioned to not engage in alcohol and or illegal drug use while on prescription medicines. In the event of worsening symptoms, patient is instructed to call the crisis hotline, 911 and or go to the nearest ED for appropriate evaluation and treatment of symptoms. Follow-up with your primary care provider for your other medical issues, concerns and or health care needs.   Signed: Sanjuana Kava, NP, PMHNP-BC 05/13/2015, 9:39 AM  I personally assessed the patient and formulated the plan Madie Reno A. Dub Mikes, M.D.

## 2015-05-12 NOTE — [facility] Group Notes (Signed)
[facility] LCSW Group Therapy Topic:  Overcoming Obstacles  05/12/2015 3:26 PM  Type of Therapy:  Group Therapy  Participation Level:  Did Not Attend   Cunningham, Anne C 05/12/2015, 3:26 PM  

## 2015-05-12 NOTE — Progress Notes (Signed)
D:  Patient denied SI and HI, contracts for safety.  Denied A/V hallucinations. A:  Medications administered per MD order.  Emotional support and encouragement. R:  Safety maintained with 15 minute checks. Patient stated she wants to go home today, that she can't take it here any longer.  Patient did not ask for pain medication this morning.

## 2015-05-12 NOTE — Progress Notes (Signed)
  [facility] Case Management Discharge Plan :  Will you be returning to the same living situation after discharge:  Yes,  home with sister until accepted for treatment At discharge, do you have transportation home?: Yes,  sister coming at 2pm Do you have the ability to pay for your medications: Yes,  mental health  Release of information consent forms completed and submitted to medical records by CSW.  Patient to Follow up at: Follow-up Information    Follow up with [facility].   Why:  Referral faxed: 05/08/15. Patient is on waitlist and must call daily at [facility]    Contact information:   [address] [place], [place] [address] Phone: [phone] Fax: [phone]      [facility].   Why:  Referral faxed: 05/08/15. Please call Olegario MessierKathy to check status of waitlist. Thank you.    Contact information:   [address] [place], [place] [address] Phone: [phone] Fax: [phone]      [facility] with [facility] [place].   Why:  Waiting for appt time/[phone]. Social worker will call you with appt time and date if you discharge before this is scheduled. Thank you.    Contact information:   [address] [place], [place] [address] Phone: [phone] Fax: [phone]      Next level of care provider has access to [facility] Link:no  Safety Planning and Suicide Prevention discussed: Yes,  SPE completed with pt's sister. Pt's sister aware of aftercare plan and is now comfortable with pt discharging to her home until accepted for treatment. SPI pamphlet and mobile crisis information provided to pt.   Have you used any form of tobacco in the last 30 days? (Cigarettes, Smokeless Tobacco, Cigars, and/or Pipes): Yes  Has patient been referred to the Quitline?: Patient refused referral  Patient has been referred for addiction treatment: Yes  Smart, Benno Brensinger LCSW 05/12/2015, 1:12 PM

## 2015-05-12 NOTE — Progress Notes (Signed)
Discharge Note:  Patient discharged.  Patient denied SI and HI.  Denied A/V hallucinations.  Suicide prevention information given and discussed with patient who stated she understood and had no questions.  Patient stated she received all her belongings, prescriptions, clothing, misc items, toiletries, make-up, pink bear, etc.  All required discharge information reviewed and given to patient at discharge.  Patient stated she appreciated all assistance received from [facility] staff.

## 2015-05-12 NOTE — Tx Team (Signed)
Interdisciplinary Treatment Plan Update (Adult)  Date:  05/12/2015  Time Reviewed:  12:24 PM   Progress in Treatment: Attending groups: No.  Participating in groups:  No. Taking medication as prescribed:  Yes. Tolerating medication:  Yes. Family/Significant othe contact made:  SPE completed with pt's sister.  Patient understands diagnosis:  Yes. and As evidenced by:  seeking treatment for polysubstance abuse, depression, insomnia, and for medication stabilization. Discussing patient identified problems/goals with staff:  Yes. Medical problems stabilized or resolved:  Yes. Denies suicidal/homicidal ideation: Yes. Issues/concerns per patient self-inventory:  Other:  Discharge Plan or Barriers: Pt is on waitlist for both [facility] and [facility]. Pt appt is being made with Tamela Gammon in the meantime and she will be staying with her sister until accepted for treatment.   Reason for Continuation of Hospitalization: none  Comments:  Alice [name] is an 50 y.o. female presenting to [facility] reporting increasing depression and requesting detox. Pt reported that she contacted mobile crisis today and they recommended that she come to the hospital. Pt denies SI, HI and AVH at this time. Pt did not report any previous suicide attempts or self-injurious behaviors. Pt did not report any current mental health treatment but shared that she has had substance abuse treatment in the past. Pt is reporting multiple depressive symptoms and shared that her sleep is poor. Pt reported that her appetite is good but reported losing "70lbs over 2 months". Pt reported that she is dealing with multiple stressors and stated "I have cervical cancer". PT did not report any pending criminal charges or upcoming court dates but shared that she is on probation for bad checks. Pt reported that she abuses Opana, heroin, crack/cocaine, Roxy's, Suboxone and Oxycotin. PT did not report any physical, sexual or emotional abuse at this  time. Diagnosis: Major Depressive Disorder; Opioid Use Disorder, Severe; Cocaine Use Disorder; Severe   Estimated length of stay:  D/c today  Additional Comments:  Patient and CSW reviewed pt's identified goals and treatment plan. Patient verbalized understanding and agreed to treatment plan. CSW reviewed [facility] "Discharge Process and Patient Involvement" Form. Pt verbalized understanding of information provided and signed form.    Review of initial/current patient goals per problem list:  1. Goal(s): Patient will participate in aftercare plan  Met:Yes   Target date: at discharge  As evidenced by: Patient will participate within aftercare plan AEB aftercare provider and housing plan at discharge being identified.  4/27: CSW assessing for appropriate referrals.   5/1: [facility]. Tamela Gammon appt made. Pt to stay with sister.   2. Goal (s): Patient will exhibit decreased depressive symptoms and suicidal ideations.  Met: Yes.    Target date: at discharge  As evidenced by: Patient will utilize self rating of depression at 3 or below and demonstrate decreased signs of depression or be deemed stable for discharge by MD.  4/27: Pt rates depression as high but denies SI/HI/AVH.   5/1: Pt rates depression as low today. Denies  SI/HI/AVH and reports that she feels ready to d/c today.   3. Goal(s): Patient will demonstrate decreased signs of withdrawal due to substance abuse  Met:Yes.   Target date:at discharge   As evidenced by: Patient will produce a CIWA/COWS score of 0, have stable vitals signs, and no symptoms of withdrawal.  4/27: Pt reports mild withdrawals with COWS of 1 and stable vitals.   5/1: Pt reports no signs of withdrawal with no COWS and stable  vitals.   Attendees: Patient:   05/12/2015 12:24 PM   Family:   05/12/2015 12:24 PM   Physician:  Dr. Carlton Adam, MD 05/12/2015 12:24 PM   Nursing:   Everlean Cherry RN 05/12/2015 12:24  PM   Clinical Social Worker: Maxie Better, LCSW 05/12/2015 12:24 PM   Clinical Social Worker: Erasmo Downer Drinkard LCSW 05/12/2015 12:24 PM   Other:  Gerline Legacy Nurse Case Manager 05/12/2015 12:24 PM   Other:  Agustina Caroli NP  05/12/2015 12:24 PM   Other:   05/12/2015 12:24 PM   Other:  05/12/2015 12:24 PM   Other:  05/12/2015 12:24 PM   Other:  05/12/2015 12:24 PM    05/12/2015 12:24 PM    05/12/2015 12:24 PM    05/12/2015 12:24 PM    05/12/2015 12:24 PM    Scribe for Treatment Team:   Maxie Better, LCSW 05/12/2015 12:24 PM

## 2015-05-12 NOTE — Progress Notes (Signed)
D-pts sister came to visit tonight, pt c/o of feeling agitated and having restless legs, sts its hard to get comfortable in her bed, pt seems very restless; pt had an anger outburst at a staff member A-pt took her pm medications R-cont to monitor for safety

## 2015-05-12 NOTE — Progress Notes (Signed)
D: Patient resting in bed with eyes closed.  Respirations even and unlabored.  Patient appears to be in no apparent distress. A: Staff to monitor Q 15 mins for safety.   R:Patient remains safe on the unit.  

## 2015-05-12 NOTE — B[facility] Suicide Risk Assessment (Signed)
[facility] Discharge Suicide Risk Assessment   Principal Problem: Severe recurrent major depression without psychotic features [facility]) Discharge Diagnoses:  Patient Active Problem List   Diagnosis Date Noted  . Substance induced mood disorder (HCC) [F19.94] 05/08/2015  . Severe recurrent major depression without psychotic features (HCC) [F33.2] 05/08/2015  . Nicotine abuse [F19.10] 10/30/2013  . Opiate dependence (HCC) [F11.20] 07/30/2011    Class: Acute  . CHEST PAIN, PRECORDIAL [R07.2] 02/18/2010  . ANEMIA [D64.9] 12/05/2009  . ANXIETY [F41.1] 12/05/2009  . TOBACCO ABUSE [F17.200] 12/05/2009  . PALPITATIONS [R00.2] 12/05/2009    Total Time spent with patient: 20 minutes  Musculoskeletal: Strength & Muscle Tone: within normal limits Gait & Station: normal Patient leans: normal  Psychiatric Specialty Exam: Review of Systems  Constitutional: Negative.   HENT: Negative.   Eyes: Negative.   Respiratory: Negative.   Cardiovascular: Negative.   Gastrointestinal: Negative.   Genitourinary: Negative.   Musculoskeletal: Negative.   Skin: Negative.   Neurological: Negative.   Endo/Heme/Allergies: Negative.   Psychiatric/Behavioral: Positive for substance abuse. The patient is nervous/anxious.     Blood pressure 92/47, pulse 81, temperature 98.5 F (36.9 C), temperature source Oral, resp. rate 17, height 6' (1.829 m), weight 61.689 kg (136 lb), last menstrual period 07/30/2011, SpO2 100 %.Body mass index is 18.44 kg/(m^2).  General Appearance: Fairly Groomed  Patent attorney::  Fair  Speech:  Clear and Coherent409  Volume:  Normal  Mood:  Euthymic  Affect:  Appropriate  Thought Process:  Coherent and Goal Directed  Orientation:  Full (Time, Place, and Person)  Thought Content:  plans as she moves on, hoping to get a bed at [facility] soon  Suicidal Thoughts:  No  Homicidal Thoughts:  No  Memory:  Immediate;   Fair Recent;   Fair Remote;   Fair  Judgement:  Fair  Insight:  Present   Psychomotor Activity:  Normal  Concentration:  Fair  Recall:  [employer] of Knowledge:Fair  Language: Fair  Akathisia:  No  Handed:  Right  AIMS (if indicated):     Assets:  Desire for Improvement Social Support  Sleep:  Number of Hours: 6.5  Cognition: WNL  ADL's:  Intact  In full contact with reality. There are no active S/S of withdrawal. There are no active SI plans or intent. She is willing and motivated to pursue residential treatment Mental Status Per Nursing Assessment::   On Admission:  NA  Demographic Factors:  Caucasian  Loss Factors: none identified  Historical Factors: none identified  Risk Reduction Factors:   Sense of responsibility to family and Positive social support  Continued Clinical Symptoms:  Alcohol/Substance Abuse/Dependencies  Cognitive Features That Contribute To Risk:  None    Suicide Risk:  Minimal: No identifiable suicidal ideation.  Patients presenting with no risk factors but with morbid ruminations; may be classified as minimal risk based on the severity of the depressive symptoms  Follow-up Information    Follow up with [facility].   Why:  Referral faxed: 05/08/15. Patient is on waitlist and must call daily at [facility] information:   [address] [place], [place] [address] Phone: [phone] Fax: [phone]      Follow up with [facility].   Why:  Referral faxed: 05/08/15. Please call Olegario Messier to check status of waitlist. Thank you.    Contact information:   [address] [place], [place] [address] Phone: [phone] Fax: [phone]      Follow up with Arna Medici  On 05/13/2015.   Why:  Appt on this date at 9:00AM. Please bring: Photo ID/Drivers License, Psychologist, forensicocial Security Card, Proof of income if you have any.    Contact information:   [address] [place], [place] [address] Phone: [phone] Fax: [phone]      Plan Of Care/Follow-up recommendations:  Activity:  as tolerated Diet:   regular Follow up [facility] as above Jeffie Widdowson A, MD 05/12/2015, 1:33 PM

## 2015-07-11 ENCOUNTER — Emergency Department (HOSPITAL_COMMUNITY)
Admission: EM | Admit: 2015-07-11 | Discharge: 2015-07-11 | Disposition: A | Discharge status: Home / Self Care | Payer: Medicaid Other | Attending: Emergency Medicine | Admitting: Emergency Medicine

## 2015-07-11 ENCOUNTER — Encounter (HOSPITAL_COMMUNITY): Payer: Self-pay

## 2015-07-11 DIAGNOSIS — M436 Torticollis: Secondary | ICD-10-CM | POA: Insufficient documentation

## 2015-07-11 DIAGNOSIS — M542 Cervicalgia: Secondary | ICD-10-CM | POA: Diagnosis present

## 2015-07-11 DIAGNOSIS — M549 Dorsalgia, unspecified: Secondary | ICD-10-CM | POA: Diagnosis not present

## 2015-07-11 DIAGNOSIS — R509 Fever, unspecified: Secondary | ICD-10-CM

## 2015-07-11 DIAGNOSIS — W57XXXA Bitten or stung by nonvenomous insect and other nonvenomous arthropods, initial encounter: Secondary | ICD-10-CM

## 2015-07-11 MED ORDER — CYCLOBENZAPRINE HCL 10 MG PO TABS: 10.0000 mg | ORAL_TABLET | Freq: Three times a day (TID) | ORAL | Status: DC | PRN

## 2015-07-11 MED ORDER — KETOROLAC TROMETHAMINE 60 MG/2ML IM SOLN
60.0000 mg | Freq: Once | INTRAMUSCULAR | Status: AC
  Administered 2015-07-11: 60 mg via INTRAMUSCULAR
  Filled 2015-07-11: qty 2

## 2015-07-11 MED ORDER — DOXYCYCLINE HYCLATE 100 MG PO CAPS: 100.0000 mg | ORAL_CAPSULE | Freq: Two times a day (BID) | ORAL | Status: DC

## 2015-07-11 MED ORDER — DOXYCYCLINE HYCLATE 100 MG PO TABS
100.0000 mg | ORAL_TABLET | Freq: Once | ORAL | Status: AC
  Administered 2015-07-11: 100 mg via ORAL
  Filled 2015-07-11: qty 1

## 2015-07-11 MED ORDER — DIAZEPAM 5 MG/ML IJ SOLN
10.0000 mg | Freq: Once | INTRAMUSCULAR | Status: AC
  Administered 2015-07-11: 10 mg via INTRAMUSCULAR
  Filled 2015-07-11: qty 2

## 2015-07-11 MED ORDER — NAPROXEN 500 MG PO TABS: ORAL_TABLET | ORAL | Status: DC

## 2015-07-11 NOTE — ED Provider Notes (Signed)
CSN: [id number]     Arrival date & time 07/11/15  0441 History   First MD Initiated Contact with Patient 07/11/15 0507    Chief Complaint  Patient presents with  . Neck Pain     (Consider location/radiation/quality/duration/timing/severity/associated sxs/prior Treatment) HPI patient reports she has slept in a recliner for the past 7 years because of back problems. She states she woke up yesterday morning about 8 AM and she's had some neck and shoulder muscle pain between her shoulder blades that has not responded to heat therapy. She denies any known fever or chills, numbness or tingling of her extremities, headache, sore throat, rhinorrhea, cough, nausea, vomiting, or diarrhea. She has no prior history of neck problems. She states she does not have a rash but she has found ticks on herself. She states she thinks she gets it from walking in the yard from the house to the car. She does not have pets. She states the left side of her neck and back hurt more than the right although the right hurts also.  Of note patient was treated earlier this year in April for polysubstance abuse including heroin, Opana, and cocaine.  PCP [facility] in [place]  Past Medical History  Diagnosis Date  . Anemia   . Heart murmur   . Hypertension   . Chronic low back pain    Past Surgical History  Procedure Laterality Date  . Back surgery      hardware in back  . Abdominal hysterectomy      abd?  . Tumor reomoved from stomach    . Cyst removed from ovary    . Removal of bb in finger  2008  . Salivary gland surgery    . Fracture surgery      LUMBAR SPINE   Family History  Problem Relation Age of Onset  . Hypertension Mother   . Cancer Father    Social History  Substance Use Topics  . Smoking status: Current Every Day Smoker -- 0.50 packs/day  . Smokeless tobacco: None  . Alcohol Use: No   Lives with fianc On disability since she fell at work and has rods in her back  OB History     No data available     Review of Systems  All other systems reviewed and are negative.     Allergies  Tetracycline  Home Medications    Patient states she only takes lisinopril  Prior to Admission medications   Medication Sig Start Date End Date Taking? Authorizing Provider  lisinopril (PRINIVIL,ZESTRIL) 5 MG tablet Take 2 tablets (10 mg total) by mouth daily. For high blood pressure. 05/12/15  Yes Sanjuana Kava, NP  cyclobenzaprine (FLEXERIL) 10 MG tablet Take 1 tablet (10 mg total) by mouth 3 (three) times daily as needed for muscle spasms. 07/11/15   Devoria Albe, MD  doxycycline (VIBRAMYCIN) 100 MG capsule Take 1 capsule (100 mg total) by mouth 2 (two) times daily. 07/11/15   Devoria Albe, MD  FLUoxetine (PROZAC) 10 MG capsule Take 1 capsule (10 mg total) by mouth daily. For depression 05/12/15   Sanjuana Kava, NP  hydrOXYzine (ATARAX/VISTARIL) 50 MG tablet Take 1 tablet (50 mg total) by mouth every 6 (six) hours as needed for anxiety. 05/12/15   Sanjuana Kava, NP  methocarbamol (ROBAXIN) 500 MG tablet Take 1 tablet (500 mg total) by mouth every 6 (six) hours as needed for muscle spasms. 05/12/15   Sanjuana Kava, NP  naproxen (NAPROSYN)  500 MG tablet Take 1 po BID with food prn pain 07/11/15   Devoria AlbeIva Jarick Harkins, MD  nitrofurantoin, macrocrystal-monohydrate, (MACROBID) 100 MG capsule Take 1 capsule (100 mg total) by mouth every 12 (twelve) hours. For urinary tract infection 05/12/15   Sanjuana KavaAgnes I Nwoko, NP  QUEtiapine (SEROQUEL) 100 MG tablet Take 1 tablet (100 mg total) by mouth at bedtime. For mood control 05/12/15   Sanjuana KavaAgnes I Nwoko, NP  traZODone (DESYREL) 100 MG tablet Take 1 tablet (100 mg total) by mouth at bedtime and may repeat dose one time if needed. For sleep 05/12/15   Sanjuana KavaAgnes I Nwoko, NP    ED Triage Vitals  Enc Vitals Group     BP 07/11/15 0446 144/100 mmHg     Pulse Rate 07/11/15 0446 136     Resp 07/11/15 0446 22     Temp 07/11/15 0446 100.3 F (37.9 C)     Temp Source 07/11/15 0446 Oral     SpO2  07/11/15 0446 100 %     Weight 07/11/15 0446 135 lb (61.236 kg)     Height 07/11/15 0446 6' (1.829 m)     Head Cir --      Peak Flow --      Pain Score 07/11/15 0444 10     Pain Loc --      Pain Edu? --      Excl. in GC? --    Vital signs normal Except low-grade fever and tachycardia   Physical Exam  Constitutional: She is oriented to person, place, and time. She appears well-developed and well-nourished.  Non-toxic appearance. She does not appear ill. No distress.  Patient sitting upright  HENT:  Head: Normocephalic and atraumatic.  Right Ear: External ear normal.  Left Ear: External ear normal.  Nose: Nose normal. No mucosal edema or rhinorrhea.  Mouth/Throat: Oropharynx is clear and moist and mucous membranes are normal. No dental abscesses or uvula swelling.  Eyes: Conjunctivae and EOM are normal. Pupils are equal, round, and reactive to light.  Neck: Normal range of motion and full passive range of motion without pain. Neck supple.  Patient has diffuse tenderness of her neck including the trapezius muscles bilaterally and she states the left hurts more than the right. Her muscles are tight. She holds her head stiffly looking forward and will not turn her head to look left or right.  Cardiovascular: Normal rate, regular rhythm and normal heart sounds.  Exam reveals no gallop and no friction rub.   No murmur heard. Pulmonary/Chest: Effort normal and breath sounds normal. No respiratory distress. She has no wheezes. She has no rhonchi. She has no rales. She exhibits no tenderness and no crepitus.  Abdominal: Soft. Normal appearance and bowel sounds are normal. She exhibits no distension. There is no tenderness. There is no rebound and no guarding.  Musculoskeletal: Normal range of motion. She exhibits no edema or tenderness.  Moves all extremities well.   Neurological: She is alert and oriented to person, place, and time. She has normal strength. No cranial nerve deficit.  Skin:  Skin is warm, dry and intact. No rash noted. No erythema. No pallor.  Psychiatric: She has a normal mood and affect. Her speech is normal and behavior is normal. Her mood appears not anxious.  Nursing note and vitals reviewed.   ED Course  Procedures (including critical care time)  Medications  ketorolac (TORADOL) injection 60 mg (60 mg Intramuscular Given 07/11/15 0522)  diazepam (VALIUM) injection 10 mg (10 mg  Intramuscular Given 07/11/15 0522)  doxycycline (VIBRA-TABS) tablet 100 mg (100 mg Oral Given 07/11/15 0616)    5:40 AM I have looked in it's not listed what the patient's tetracycline allergy is. Patient states she gets little red bumps that itch however she states she can take doxycycline without problems. She was started on doxycycline for possible tickborne illness.  Patient's temperature was rechecked and was 99.2 without treatment.  Recheck at 6:30 AM patient is feeling better. She's not able to move her head. She looks like she is much more comfortable. We discussed taking the antibiotics until gone. She was prescribed 3 weeks worth. She was discharged home with anti-inflammatory and muscle relaxer. She was not given a narcotic because she just went through detox for narcotic addiction 2 months ago.     MDM   Final diagnoses:  Fever, unspecified fever cause  Tick bite  Torticollis, acute   New Prescriptions   CYCLOBENZAPRINE (FLEXERIL) 10 MG TABLET    Take 1 tablet (10 mg total) by mouth 3 (three) times daily as needed for muscle spasms.   DOXYCYCLINE (VIBRAMYCIN) 100 MG CAPSULE    Take 1 capsule (100 mg total) by mouth 2 (two) times daily.   NAPROXEN (NAPROSYN) 500 MG TABLET    Take 1 po BID with food prn pain    Plan discharge  Devoria AlbeIva Saqib Cazarez, MD, [facility]      Sreshta Cressler, MD 07/11/15 [phone]

## 2015-07-11 NOTE — ED Notes (Signed)
Pt states she sleeps in the recliner regularly, states last evening she awoke with pain and stiffness to her neck and upper back.  Pt denies injury or trauma.

## 2015-07-11 NOTE — Discharge Instructions (Signed)
Use ice and heat on your painful muscles. Take the medications as prescribed. Take the antibiotics until gone. If you get a fever you can take acetaminophen 650 mg every 6 hrs.    Acute Torticollis Torticollis is a condition in which the muscles of the neck tighten (contract) abnormally, causing the neck to twist and the head to move into an unnatural position. Torticollis that develops suddenly is called acute torticollis. If torticollis becomes chronic and is left untreated, the face and neck can become deformed. CAUSES This condition may be caused by:  Sleeping in an awkward position (common).  Extending or twisting the neck muscles beyond their normal position.  Infection. In some cases, the cause may not be known. SYMPTOMS Symptoms of this condition include: 1. An unnatural position of the head. 2. Neck pain. 3. A limited ability to move the neck. 4. Twisting of the neck to one side. DIAGNOSIS This condition is diagnosed with a physical exam. You may also have imaging tests, such as an X-ray, CT scan, or MRI. TREATMENT Treatment for this condition involves trying to relax the neck muscles. It may include:  Medicines or shots.  Physical therapy.  Surgery. This may be done in severe cases. HOME CARE INSTRUCTIONS  Take medicines only as directed by your health care provider.  Do stretching exercises and massage your neck as directed by your health care provider.  Keep all follow-up visits as directed by your health care provider. This is important. SEEK MEDICAL CARE IF:  You develop a fever. SEEK IMMEDIATE MEDICAL CARE IF:  You develop difficulty breathing.  You develop noisy breathing (stridor).  You start drooling.  You have trouble swallowing or have pain with swallowing.  You develop numbness or weakness in your hands or feet.  You have changes in your speech, understanding, or vision.  Your pain gets worse.   This information is not intended to replace  advice given to you by your health care provider. Make sure you discuss any questions you have with your health care provider.   Document Released: 12/26/1999 Document Revised: 05/14/2014 Document Reviewed: 12/24/2013 Elsevier Interactive Patient Education 2016 Elsevier Inc.  Tick Bite Information Ticks are insects that attach themselves to the skin. There are many types of ticks. Common types include wood ticks and deer ticks. Sometimes, ticks carry diseases that can make a person very ill. The most common places for ticks to attach themselves are the scalp, neck, armpits, waist, and groin.  HOW CAN YOU PREVENT TICK BITES? Take these steps to help prevent tick bites when you are outdoors:  Wear long sleeves and long pants.  Wear white clothes so you can see ticks more easily.  Tuck your pant legs into your socks.  If walking on a trail, stay in the middle of the trail to avoid brushing against bushes.  Avoid walking through areas with long grass.  Put bug spray on all skin that is showing and along boot tops, pant legs, and sleeve cuffs.  Check clothes, hair, and skin often and before going inside.  Brush off any ticks that are not attached.  Take a shower or bath as soon as possible after being outdoors. HOW SHOULD YOU REMOVE A TICK? Ticks should be removed as soon as possible to help prevent diseases. 5. If latex gloves are available, put them on before trying to remove a tick. 6. Use tweezers to grasp the tick as close to the skin as possible. You may also use curved forceps  or a tick removal tool. Grasp the tick as close to its head as possible. Avoid grasping the tick on its body. 7. Pull gently upward until the tick lets go. Do not twist the tick or jerk it suddenly. This may break off the tick's head or mouth parts. 8. Do not squeeze or crush the tick's body. This could force disease-carrying fluids from the tick into your body. 9. After the tick is removed, wash the bite  area and your hands with soap and water or alcohol. 10. Apply a small amount of antiseptic cream or ointment to the bite site. 11. Wash any tools that were used. Do not try to remove a tick by applying a hot match, petroleum jelly, or fingernail polish to the tick. These methods do not work. They may also increase the chances of disease being spread from the tick bite. WHEN SHOULD YOU SEEK HELP? Contact your health care provider if you are unable to remove a tick or if a part of the tick breaks off in the skin. After a tick bite, you need to watch for signs and symptoms of diseases that can be spread by ticks. Contact your health care provider if you develop any of the following:  Fever.  Rash.  Redness and puffiness (swelling) in the area of the tick bite.  Tender, puffy lymph glands.  Watery poop (diarrhea).  Weight loss.  Cough.  Feeling more tired than normal (fatigue).  Muscle, joint, or bone pain.  Belly (abdominal) pain.  Headache.  Change in your level of consciousness.  Trouble walking or moving your legs.  Loss of feeling (numbness) in the legs.  Loss of movement (paralysis).  Shortness of breath.  Confusion.  Throwing up (vomiting) many times.   This information is not intended to replace advice given to you by your health care provider. Make sure you discuss any questions you have with your health care provider.   Document Released: 03/24/2009 Document Revised: 08/30/2012 Document Reviewed: 06/07/2012 Elsevier Interactive Patient Education [employer]2016 Elsevier Inc.

## 2015-07-29 ENCOUNTER — Emergency Department (HOSPITAL_COMMUNITY): Payer: Medicaid Other

## 2015-07-29 ENCOUNTER — Encounter: Payer: Self-pay | Admitting: Family Medicine

## 2015-07-29 ENCOUNTER — Ambulatory Visit (INDEPENDENT_AMBULATORY_CARE_PROVIDER_SITE_OTHER): Payer: Medicaid Other

## 2015-07-29 ENCOUNTER — Inpatient Hospital Stay (HOSPITAL_COMMUNITY)
Admission: EM | Admit: 2015-07-29 | Discharge: 2015-08-02 | DRG: 552 | Disposition: A | Discharge status: Home / Self Care | Payer: Medicaid Other | Attending: Internal Medicine | Admitting: Internal Medicine

## 2015-07-29 ENCOUNTER — Ambulatory Visit (INDEPENDENT_AMBULATORY_CARE_PROVIDER_SITE_OTHER): Payer: Medicaid Other | Admitting: Family Medicine

## 2015-07-29 ENCOUNTER — Other Ambulatory Visit: Payer: Self-pay | Admitting: Family Medicine

## 2015-07-29 ENCOUNTER — Encounter (HOSPITAL_COMMUNITY): Payer: Self-pay | Admitting: *Deleted

## 2015-07-29 VITALS — BP 138/82 | HR 103 | Temp 97.0°F | Ht 72.0 in | Wt 130.6 lb

## 2015-07-29 DIAGNOSIS — F17210 Nicotine dependence, cigarettes, uncomplicated: Secondary | ICD-10-CM | POA: Diagnosis present

## 2015-07-29 DIAGNOSIS — M8448XA Pathological fracture, other site, initial encounter for fracture: Secondary | ICD-10-CM | POA: Diagnosis present

## 2015-07-29 DIAGNOSIS — M4622 Osteomyelitis of vertebra, cervical region: Secondary | ICD-10-CM | POA: Diagnosis present

## 2015-07-29 DIAGNOSIS — R05 Cough: Secondary | ICD-10-CM

## 2015-07-29 DIAGNOSIS — R011 Cardiac murmur, unspecified: Secondary | ICD-10-CM | POA: Diagnosis present

## 2015-07-29 DIAGNOSIS — F1123 Opioid dependence with withdrawal: Secondary | ICD-10-CM | POA: Diagnosis not present

## 2015-07-29 DIAGNOSIS — E878 Other disorders of electrolyte and fluid balance, not elsewhere classified: Secondary | ICD-10-CM | POA: Diagnosis present

## 2015-07-29 DIAGNOSIS — M4642 Discitis, unspecified, cervical region: Secondary | ICD-10-CM | POA: Diagnosis present

## 2015-07-29 DIAGNOSIS — M542 Cervicalgia: Secondary | ICD-10-CM | POA: Diagnosis present

## 2015-07-29 DIAGNOSIS — R059 Cough, unspecified: Secondary | ICD-10-CM

## 2015-07-29 DIAGNOSIS — F332 Major depressive disorder, recurrent severe without psychotic features: Secondary | ICD-10-CM | POA: Diagnosis present

## 2015-07-29 DIAGNOSIS — E871 Hypo-osmolality and hyponatremia: Secondary | ICD-10-CM | POA: Diagnosis present

## 2015-07-29 DIAGNOSIS — B954 Other streptococcus as the cause of diseases classified elsewhere: Secondary | ICD-10-CM | POA: Diagnosis present

## 2015-07-29 DIAGNOSIS — F1910 Other psychoactive substance abuse, uncomplicated: Secondary | ICD-10-CM | POA: Diagnosis present

## 2015-07-29 DIAGNOSIS — R7881 Bacteremia: Secondary | ICD-10-CM | POA: Diagnosis present

## 2015-07-29 DIAGNOSIS — Z885 Allergy status to narcotic agent status: Secondary | ICD-10-CM | POA: Diagnosis not present

## 2015-07-29 DIAGNOSIS — I10 Essential (primary) hypertension: Secondary | ICD-10-CM | POA: Diagnosis present

## 2015-07-29 DIAGNOSIS — S129XXA Fracture of neck, unspecified, initial encounter: Secondary | ICD-10-CM | POA: Diagnosis not present

## 2015-07-29 DIAGNOSIS — F1410 Cocaine abuse, uncomplicated: Secondary | ICD-10-CM | POA: Diagnosis present

## 2015-07-29 DIAGNOSIS — Z881 Allergy status to other antibiotic agents status: Secondary | ICD-10-CM | POA: Diagnosis not present

## 2015-07-29 DIAGNOSIS — F1120 Opioid dependence, uncomplicated: Secondary | ICD-10-CM | POA: Diagnosis present

## 2015-07-29 DIAGNOSIS — D649 Anemia, unspecified: Secondary | ICD-10-CM | POA: Diagnosis present

## 2015-07-29 DIAGNOSIS — F17200 Nicotine dependence, unspecified, uncomplicated: Secondary | ICD-10-CM | POA: Diagnosis present

## 2015-07-29 DIAGNOSIS — S12600A Unspecified displaced fracture of seventh cervical vertebra, initial encounter for closed fracture: Secondary | ICD-10-CM | POA: Diagnosis not present

## 2015-07-29 DIAGNOSIS — F1420 Cocaine dependence, uncomplicated: Secondary | ICD-10-CM | POA: Diagnosis not present

## 2015-07-29 DIAGNOSIS — W19XXXA Unspecified fall, initial encounter: Secondary | ICD-10-CM | POA: Diagnosis not present

## 2015-07-29 LAB — BASIC METABOLIC PANEL
Anion gap: 6 (ref 5–15)
BUN: 10 mg/dL (ref 6–20)
CALCIUM: 8.9 mg/dL (ref 8.9–10.3)
CO2: 29 mmol/L (ref 22–32)
CREATININE: 0.76 mg/dL (ref 0.44–1.00)
Chloride: 99 mmol/L — ABNORMAL LOW (ref 101–111)
GLUCOSE: 97 mg/dL (ref 65–99)
Potassium: 3.7 mmol/L (ref 3.5–5.1)
Sodium: 134 mmol/L — ABNORMAL LOW (ref 135–145)

## 2015-07-29 LAB — CBC
HEMATOCRIT: 33.5 % — AB (ref 36.0–46.0)
Hemoglobin: 11 g/dL — ABNORMAL LOW (ref 12.0–15.0)
MCH: 28.4 pg (ref 26.0–34.0)
MCHC: 32.8 g/dL (ref 30.0–36.0)
MCV: 86.3 fL (ref 78.0–100.0)
PLATELETS: 279 10*3/uL (ref 150–400)
RBC: 3.88 MIL/uL (ref 3.87–5.11)
RDW: 13.6 % (ref 11.5–15.5)
WBC: 13.3 10*3/uL — ABNORMAL HIGH (ref 4.0–10.5)

## 2015-07-29 MED ORDER — MORPHINE SULFATE (PF) 4 MG/ML IV SOLN
4.0000 mg | Freq: Once | INTRAVENOUS | Status: AC
  Administered 2015-07-29: 4 mg via INTRAVENOUS
  Filled 2015-07-29: qty 1

## 2015-07-29 MED ORDER — PIPERACILLIN-TAZOBACTAM 3.375 G IVPB 30 MIN
3.3750 g | Freq: Once | INTRAVENOUS | Status: AC
  Administered 2015-07-29: 3.375 g via INTRAVENOUS
  Filled 2015-07-29: qty 50

## 2015-07-29 MED ORDER — LORAZEPAM 2 MG/ML IJ SOLN
1.0000 mg | Freq: Once | INTRAMUSCULAR | Status: AC
  Administered 2015-07-29: 1 mg via INTRAVENOUS
  Filled 2015-07-29: qty 1

## 2015-07-29 MED ORDER — DEXAMETHASONE SODIUM PHOSPHATE 4 MG/ML IJ SOLN
2.0000 mg | Freq: Three times a day (TID) | INTRAMUSCULAR | Status: DC
  Administered 2015-07-29 – 2015-08-02 (×11): 2 mg via INTRAVENOUS
  Filled 2015-07-29 (×11): qty 1

## 2015-07-29 MED ORDER — GADOBENATE DIMEGLUMINE 529 MG/ML IV SOLN
10.0000 mL | Freq: Once | INTRAVENOUS | Status: AC | PRN
  Administered 2015-07-29: 10 mL via INTRAVENOUS

## 2015-07-29 MED ORDER — VANCOMYCIN HCL IN DEXTROSE 750-5 MG/150ML-% IV SOLN
750.0000 mg | Freq: Two times a day (BID) | INTRAVENOUS | Status: DC
  Filled 2015-07-29 (×2): qty 150

## 2015-07-29 MED ORDER — VANCOMYCIN HCL IN DEXTROSE 1-5 GM/200ML-% IV SOLN
1000.0000 mg | Freq: Once | INTRAVENOUS | Status: AC
  Administered 2015-07-29: 1000 mg via INTRAVENOUS
  Filled 2015-07-29: qty 200

## 2015-07-29 MED ORDER — HYDROMORPHONE HCL 1 MG/ML IJ SOLN
1.0000 mg | Freq: Once | INTRAMUSCULAR | Status: AC
  Administered 2015-07-29: 1 mg via INTRAVENOUS
  Filled 2015-07-29: qty 1

## 2015-07-29 NOTE — Progress Notes (Signed)
   HPI  Patient presents today here with neck pain.  States explains that over the last 2 weeks she has had pain, however it's been very different and more severe over the last 3 days. It Radiates to the shoulders bilaterally, no hand or arm weakness. She complains of transient right leg numbness which has completely resolved over the last 3 days.  She states that she had a fall 4 weeks ago falling down some steps and landing on her right knee and causing a left-sided rib fracture. She believes the neck pain is attributed to this however it did not start hurting right after the fall.  She has a history of IV drug abuse, also has a history of stomach cancer treated with surgery.  He denies fever, chills, sweats, or shortness of breath. She does have a cough. She's tolerating food and fluids normally.  She freely admits having a problem with drug abuse previously, also with IV drug abuse. She states that she does not pain medications.  PMH: Smoking status noted ROS: Per HPI  Objective: BP 138/82 mmHg  Pulse 103  Temp(Src) 97 F (36.1 C) (Oral)  Ht 6' (1.829 m)  Wt 130 lb 9.6 oz (59.24 kg)  BMI 17.71 kg/m2  LMP 07/30/2011 Gen: NAD, alert, cooperative with exam HEENT: NCAT CV: RRR, good S1/S2, no murmur Resp: CTABL, no wheezes, non-labored Ext: No edema, warm Neuro: Alert and oriented, strength 5/5 and sensation intact in bilateral upper extremities  MSK:  Pain with palpation of the right-sided paraspinal muscles in the cervical area, also midline tenderness to palpation    Cervical spine x-ray shows poor alignment of vertebral, some concern for bone lesion on the anterior superior portion of C6.  - Official radiology read shows age uncertain anterior C7 vertebral body fracture.  Chest X-ray appears clear   Assessment and plan:  # Neck pain Age undetermined cervical neck fracture On exam patient is in tears from pain while trying to maneuver her to lie down. No  apparent neurologic deficits except for the numbness that she described which has actually resolved. Ambulance called, patient is very reluctant but does agree to go.   I'm sending her to the ED for cervical spine protection given that there are no c-collar is available, also she appears to have severe uncontrolled pain causing her to cry on exam. Considering that she has a history of IV drug abuse I don't believe that she is a good candidate for narcotic pain treatment currently and without cervical protection I don't believe she is safe for discharge to home. Appreciate ED's management     Murtis SinkSam Bradshaw, MD [facility] 07/29/2015, 4:58 PM

## 2015-07-29 NOTE — ED Provider Notes (Signed)
CSN: [id number]     Arrival date & time 07/29/15  1841 History   First MD Initiated Contact with Patient 07/29/15 1852     Chief Complaint  Patient presents with  . Neck Pain   (Consider location/radiation/quality/duration/timing/severity/associated sxs/prior Treatment) HPI 50 y.o. female with a hx of IV Drug Abuse, presents to the Emergency Department today complaining of neck pain. Brought in by EMS from PCP office due to diagnosed fracture of C7. C-collar present today. Pt states that she fell 5 weeks ago and was seen at [facility] and discharged with no imaging. Pt states that her initial fall did not involve neck trauma and went to [facility] x2 days later when she developed neck pain. States pain is worsening. Noted numbness on right big toe that travels up leg. Pt notes pain with ambulation in neck. Pain is 10/10 and sharp. Has tried ibuprofen with minimal relief. No headaches. No vision changes. No CP/SOB/ABD pain. No N/V. No fevers.     Past Medical History  Diagnosis Date  . Anemia   . Heart murmur   . Hypertension   . Chronic low back pain    Past Surgical History  Procedure Laterality Date  . Back surgery      hardware in back  . Abdominal hysterectomy      abd?  . Tumor reomoved from stomach    . Cyst removed from ovary    . Removal of bb in finger  2008  . Salivary gland surgery    . Fracture surgery      LUMBAR SPINE   Family History  Problem Relation Age of Onset  . Hypertension Mother   . Cancer Father    Social History  Substance Use Topics  . Smoking status: Current Every Day Smoker -- 0.50 packs/day  . Smokeless tobacco: None  . Alcohol Use: No   OB History    No data available     Review of Systems ROS reviewed and all are negative for acute change except as noted in the HPI.  Allergies  Tetracycline and Codeine  Home Medications   Prior to Admission medications   Medication Sig Start Date End Date Taking? Authorizing Provider   cyclobenzaprine (FLEXERIL) 10 MG tablet Take 1 tablet (10 mg total) by mouth 3 (three) times daily as needed for muscle spasms. Patient not taking: Reported on 07/29/2015 07/11/15   Devoria Albe, MD  FLUoxetine (PROZAC) 10 MG capsule Take 1 capsule (10 mg total) by mouth daily. For depression Patient not taking: Reported on 07/29/2015 05/12/15   Sanjuana Kava, NP  hydrOXYzine (ATARAX/VISTARIL) 50 MG tablet Take 1 tablet (50 mg total) by mouth every 6 (six) hours as needed for anxiety. Patient not taking: Reported on 07/29/2015 05/12/15   Sanjuana Kava, NP  lisinopril (PRINIVIL,ZESTRIL) 5 MG tablet Take 2 tablets (10 mg total) by mouth daily. For high blood pressure. 05/12/15   Sanjuana Kava, NP  methocarbamol (ROBAXIN) 500 MG tablet Take 1 tablet (500 mg total) by mouth every 6 (six) hours as needed for muscle spasms. Patient not taking: Reported on 07/29/2015 05/12/15   Sanjuana Kava, NP  naproxen (NAPROSYN) 500 MG tablet Take 1 po BID with food prn pain Patient not taking: Reported on 07/29/2015 07/11/15   Devoria Albe, MD  QUEtiapine (SEROQUEL) 100 MG tablet Take 1 tablet (100 mg total) by mouth at bedtime. For mood control Patient not taking: Reported on 07/29/2015 05/12/15   Sanjuana Kava, NP  traZODone (DESYREL) 100 MG tablet Take 1 tablet (100 mg total) by mouth at bedtime and may repeat dose one time if needed. For sleep Patient not taking: Reported on 07/29/2015 05/12/15   Sanjuana KavaAgnes I Nwoko, NP   BP 153/106 mmHg  Pulse 89  Temp(Src) 99.5 F (37.5 C) (Oral)  Resp 14  SpO2 100%  LMP 07/30/2011   Physical Exam  Constitutional: She is oriented to person, place, and time. She appears well-developed and well-nourished.  HENT:  Head: Normocephalic and atraumatic.  Eyes: EOM are normal. Pupils are equal, round, and reactive to light.  Neck: Trachea normal and normal range of motion. Neck supple. Spinous process tenderness present. No tracheal deviation present.  C-Collar in Place  Cardiovascular: Normal rate,  regular rhythm, normal heart sounds and intact distal pulses.   Pulmonary/Chest: Breath sounds normal. No respiratory distress. She has no wheezes. She has no rales. She exhibits no tenderness.  Abdominal: Soft. Bowel sounds are normal. There is no tenderness.  Musculoskeletal: Normal range of motion.  Neurological: She is alert and oriented to person, place, and time. She has normal strength and normal reflexes. No cranial nerve deficit or sensory deficit.  Pt able to ambulate. Decrease sensation along right 1st digit and 2nd digit of right leg. Motor intact. Numbness along medial aspect of right leg.   Skin: Skin is warm and dry.  Psychiatric: She has a normal mood and affect. Her behavior is normal. Thought content normal.  Nursing note and vitals reviewed.  ED Course  Procedures (including critical care time) Labs Review Labs Reviewed - No data to display  Imaging Review Dg Chest 2 View  07/29/2015  CLINICAL DATA:  Cough. Neck pain after fall. Known rib fractures from fall last month. EXAM: CHEST  2 VIEW COMPARISON:  Left rib radiographs 06/23/2015 FINDINGS: The cardiomediastinal contours are normal. The lungs are clear. Pulmonary vasculature is normal. No consolidation, pleural effusion, or pneumothorax. The left anterior sixth rib fracture on prior rib radiographs is not well visualized on the current exam. Bilateral nipple shadows are noted. IMPRESSION: No active cardiopulmonary disease. Left anterior rib fracture on prior exam not seen currently. Electronically Signed   By: Rubye OaksMelanie  Ehinger M.D.   On: 07/29/2015 17:10   Dg Cervical Spine Complete  07/29/2015  CLINICAL DATA:  Cervicalgia following fall several weeks prior EXAM: CERVICAL SPINE - COMPLETE 4+ VIEW COMPARISON:  CT neck October 18, 2013 FINDINGS: Frontal, lateral, open-mouth odontoid, and bilateral oblique views were obtained. There is an age uncertain fracture of the anterior aspect of the C7 vertebral body which was not  present on the previous study. No other fracture is evident. No spondylolisthesis. Prevertebral soft tissues and predental space regions are normal. There is moderate severe disc space narrowing at C6-7, progressed from prior study. There is moderate disc space narrowing at C5-6 and C7-T1. There are anterior osteophytes at C5, C6, and C7. There is facet hypertrophy at C5-6 and C6-7 bilaterally. IMPRESSION: Age uncertain fracture along the anterior aspect of the C7 vertebral body with mild wedging in this area. No other fracture. Multilevel arthropathy, most severe at C6-7. No spondylolisthesis. These results will be called to the ordering clinician or representative by the Radiologist Assistant, and communication documented in the PACS or zVision Dashboard. Electronically Signed   By: Bretta BangWilliam  Woodruff III M.D.   On: 07/29/2015 17:22   Mr Cervical Spine W Wo Contrast  07/29/2015  CLINICAL DATA:  50 year old female with C7 vertebral fracture on cervical spine  films performed for pain after a fall several weeks ago. Initial encounter. Personal history of IV drug abuse. EXAM: MRI CERVICAL SPINE WITHOUT AND WITH CONTRAST TECHNIQUE: Multiplanar and multiecho pulse sequences of the cervical spine, to include the craniocervical junction and cervicothoracic junction, were obtained according to standard protocol without and with intravenous contrast. CONTRAST:  10mL MULTIHANCE GADOBENATE DIMEGLUMINE 529 MG/ML IV SOLN COMPARISON:  Cervical spine radiographs 1708 hours today. Neck CT 10/18/2013. FINDINGS: Endplate irregularity and compression at both C6 and C7, more pronounced at the latter. Associated vertebral endplate marrow edema. Intervening disc space loss with some areas of increased T2 and STIR signal within the disc (series 4, image 15). Associated bulky longus coli muscle edema at C6-C7 with prevertebral abnormal T2 and STIR hyperintensity tracking from the C3-C4 level into the upper thoracic spine. Associated  abnormal epidural thickening and enhancement at C6-C7 (series 11, image 23). Superimposed abnormal dural thickening and enhancement extending posteriorly from C3-C4 to the cervicothoracic junction, ventrally from C5-C6 to T1-T2. No discrete epidural fluid collection. There is abnormal soft tissue inflammation in the bilateral cervical neural foramina, maximal at C6-C7. Posterior paraspinal muscle edema is mild to moderate compared to the prevertebral soft tissue edema. There is interspinous ligament edema at C5-C6 and C6-C7. There is associated multifactorial spinal stenosis at C6-C7 with mild spinal cord mass effect. No cord signal abnormality despite these changes. No abnormal intradural enhancement. No other cervical or visualized upper thoracic spinal stenosis or spinal cord mass effect. No other levels of disc or vertebral body infection. No other marrow edema identified. Apical lung scarring. Cervicomedullary junction is within normal limits. Grossly negative visualized brain parenchyma. Preserved major vascular flow voids in the neck at this time. IMPRESSION: 1. 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 2. No abscess or drainable fluid collection identified. 3. Associated spinal stenosis with spinal cord mass effect at C6-C7. No cord signal abnormality at this time. Electronically Signed   By: Odessa Fleming M.D.   On: 07/29/2015 21:06   I have personally reviewed and evaluated these images and lab results as part of my medical decision-making.   EKG Interpretation None      MDM  I have reviewed and evaluated the relevant laboratory values I have reviewed and evaluated the relevant imaging studies.  I have reviewed the relevant previous healthcare records. I obtained HPI from historian. Patient discussed with supervising  physician  ED Course:  Assessment: Pt is a 50yF with hx IV Drug Abuse who presents with C7 spine fx from PCP. Sent here due to worsening pain. On exam, pt in NAD. Nontoxic/nonseptic appearing. VSS. Afebrile. Lungs CTA. Heart RRR. TTP along C&. C Collar in place. Numbness noted on right lower extremity on medial aspect and on 1st and 2nd digit. DTR intact. CBC/BMP unremarkable. Concern for abscess. MRI C Spine showed Discitis osteomyelitis at C6-C7 with pathologic fracture. Given analgesia in ED. Placed on Vanc/Zosyn in ED. Blood cultures drawn beforehand. Consult to Neurosurgery recommended Decadron 2mg  q8h. Will see tomorrow Plan is to Admit to medicine.    Disposition/Plan:  Admit Pt acknowledges and agrees with plan  Supervising Physician Linwood Dibbles, MD   Final diagnoses:  Discitis of cervical region     Audry Pili, PA-C 07/29/15 2204  Linwood Dibbles, MD 07/29/15 2206

## 2015-07-29 NOTE — ED Notes (Signed)
[facility] to ED [facility] after being dx with a neck fracture. C-collar present. Pt reports falling 3 weeks ago, [facility] and [facility]. Pt c/o increased and worsening pain, is tearful on arrival

## 2015-07-29 NOTE — ED Notes (Signed)
Pt in MRI, unable to tolerate. Order for ativan given per order. Denies improvement of pain with medication

## 2015-07-29 NOTE — Progress Notes (Signed)
Pharmacy Antibiotic Note  Alice PereyraDoris [name] is a 50 y.o. female admitted on 07/29/2015 with cellulitis and osteomyelitis.  Pharmacy has been consulted for vancomycin dosing.  Pt received vancomycin 1g and zosyn 3.375g IV once in the ED.  Plan: Vancomycin 750mg  IV every 12 hours.  Goal trough 15-20 mcg/mL.  Recommend zosyn 3.375g IV q8h if continued Monitor culture data, renal function and clinical course VT at SS prn     Temp (24hrs), Avg:98.3 F (36.8 C), Min:97 F (36.1 C), Max:99.5 F (37.5 C)   Recent Labs Lab 07/29/15 1935  WBC 13.3*  CREATININE 0.76    Estimated Creatinine Clearance: 78.6 mL/min (by C-G formula based on Cr of 0.76).    Allergies  Allergen Reactions  . Tetracycline   . Codeine Rash    Antimicrobials this admission: Vanc 7/18 >>  Zosyn 7/18 >>   Dose adjustments this admission: n/a  Microbiology results:  BCx:   UCx:    Sputum:    MRSA PCR:    Alice [name], PharmD, BCPS Clinical Pharmacist Pager [phone] 07/29/2015 9:23 PM

## 2015-07-29 NOTE — Progress Notes (Signed)
  Pt admitted to the unit. Pt is stable, alert and oriented per baseline. Oriented to room, staff, and call bell. Educated to call for any assistance. Bed in lowest position, call bell within reach- will continue to monitor. 

## 2015-07-30 ENCOUNTER — Encounter (HOSPITAL_COMMUNITY): Payer: Self-pay | Admitting: Family Medicine

## 2015-07-30 ENCOUNTER — Inpatient Hospital Stay (HOSPITAL_COMMUNITY): Payer: Medicaid Other

## 2015-07-30 DIAGNOSIS — F1420 Cocaine dependence, uncomplicated: Secondary | ICD-10-CM

## 2015-07-30 DIAGNOSIS — R011 Cardiac murmur, unspecified: Secondary | ICD-10-CM

## 2015-07-30 DIAGNOSIS — F1123 Opioid dependence with withdrawal: Secondary | ICD-10-CM

## 2015-07-30 DIAGNOSIS — W19XXXA Unspecified fall, initial encounter: Secondary | ICD-10-CM

## 2015-07-30 DIAGNOSIS — F1120 Opioid dependence, uncomplicated: Secondary | ICD-10-CM

## 2015-07-30 DIAGNOSIS — F332 Major depressive disorder, recurrent severe without psychotic features: Secondary | ICD-10-CM

## 2015-07-30 DIAGNOSIS — M4642 Discitis, unspecified, cervical region: Principal | ICD-10-CM

## 2015-07-30 DIAGNOSIS — S12600A Unspecified displaced fracture of seventh cervical vertebra, initial encounter for closed fracture: Secondary | ICD-10-CM

## 2015-07-30 LAB — LACTIC ACID, PLASMA
Lactic Acid, Venous: 1.6 mmol/L (ref 0.5–1.9)
Lactic Acid, Venous: 1.6 mmol/L (ref 0.5–1.9)

## 2015-07-30 LAB — BLOOD CULTURE ID PANEL (REFLEXED)
ACINETOBACTER BAUMANNII: NOT DETECTED
CANDIDA GLABRATA: NOT DETECTED
CANDIDA KRUSEI: NOT DETECTED
CANDIDA TROPICALIS: NOT DETECTED
CARBAPENEM RESISTANCE: NOT DETECTED
Candida albicans: NOT DETECTED
Candida parapsilosis: NOT DETECTED
ESCHERICHIA COLI: NOT DETECTED
Enterobacter cloacae complex: NOT DETECTED
Enterobacteriaceae species: NOT DETECTED
Enterococcus species: NOT DETECTED
HAEMOPHILUS INFLUENZAE: NOT DETECTED
KLEBSIELLA OXYTOCA: NOT DETECTED
Klebsiella pneumoniae: NOT DETECTED
LISTERIA MONOCYTOGENES: NOT DETECTED
Methicillin resistance: NOT DETECTED
Neisseria meningitidis: NOT DETECTED
PROTEUS SPECIES: NOT DETECTED
Pseudomonas aeruginosa: NOT DETECTED
SERRATIA MARCESCENS: NOT DETECTED
STAPHYLOCOCCUS SPECIES: NOT DETECTED
STREPTOCOCCUS PYOGENES: NOT DETECTED
Staphylococcus aureus (BCID): NOT DETECTED
Streptococcus agalactiae: NOT DETECTED
Streptococcus pneumoniae: NOT DETECTED
Streptococcus species: DETECTED — AB
Vancomycin resistance: NOT DETECTED

## 2015-07-30 LAB — URINALYSIS, ROUTINE W REFLEX MICROSCOPIC
BILIRUBIN URINE: NEGATIVE
Glucose, UA: 100 mg/dL — AB
HGB URINE DIPSTICK: NEGATIVE
Ketones, ur: NEGATIVE mg/dL
NITRITE: NEGATIVE
PROTEIN: NEGATIVE mg/dL
SPECIFIC GRAVITY, URINE: 1.011 (ref 1.005–1.030)
pH: 6 (ref 5.0–8.0)

## 2015-07-30 LAB — BASIC METABOLIC PANEL
Anion gap: 6 (ref 5–15)
BUN: 7 mg/dL (ref 6–20)
CO2: 27 mmol/L (ref 22–32)
CREATININE: 0.68 mg/dL (ref 0.44–1.00)
Calcium: 8.6 mg/dL — ABNORMAL LOW (ref 8.9–10.3)
Chloride: 104 mmol/L (ref 101–111)
Glucose, Bld: 209 mg/dL — ABNORMAL HIGH (ref 65–99)
Potassium: 3.7 mmol/L (ref 3.5–5.1)
SODIUM: 137 mmol/L (ref 135–145)

## 2015-07-30 LAB — CBC WITH DIFFERENTIAL/PLATELET
BASOS ABS: 0 10*3/uL (ref 0.0–0.1)
BASOS PCT: 0 %
EOS ABS: 0 10*3/uL (ref 0.0–0.7)
EOS PCT: 0 %
HCT: 32.7 % — ABNORMAL LOW (ref 36.0–46.0)
HEMOGLOBIN: 10.4 g/dL — AB (ref 12.0–15.0)
Lymphocytes Relative: 12 %
Lymphs Abs: 1.2 10*3/uL (ref 0.7–4.0)
MCH: 27.4 pg (ref 26.0–34.0)
MCHC: 31.8 g/dL (ref 30.0–36.0)
MCV: 86.3 fL (ref 78.0–100.0)
Monocytes Absolute: 0.3 10*3/uL (ref 0.1–1.0)
Monocytes Relative: 4 %
NEUTROS PCT: 84 %
Neutro Abs: 8.3 10*3/uL — ABNORMAL HIGH (ref 1.7–7.7)
Platelets: 259 10*3/uL (ref 150–400)
RBC: 3.79 MIL/uL — AB (ref 3.87–5.11)
RDW: 13.5 % (ref 11.5–15.5)
WBC: 9.8 10*3/uL (ref 4.0–10.5)

## 2015-07-30 LAB — HEPATIC FUNCTION PANEL
ALT: 11 U/L — ABNORMAL LOW (ref 14–54)
AST: 17 U/L (ref 15–41)
Albumin: 2.9 g/dL — ABNORMAL LOW (ref 3.5–5.0)
Alkaline Phosphatase: 85 U/L (ref 38–126)
Bilirubin, Direct: <0.1 mg/dL — ABNORMAL LOW (ref 0.1–0.5)
Total Bilirubin: 0.5 mg/dL (ref 0.3–1.2)
Total Protein: 7.3 g/dL (ref 6.5–8.1)

## 2015-07-30 LAB — PROTIME-INR
INR: 1.22 (ref 0.00–1.49)
PROTHROMBIN TIME: 15.5 s — AB (ref 11.6–15.2)

## 2015-07-30 LAB — URINE MICROSCOPIC-ADD ON

## 2015-07-30 LAB — APTT: APTT: 78 s — AB (ref 24–37)

## 2015-07-30 LAB — PROCALCITONIN: Procalcitonin: 2.73 ng/mL

## 2015-07-30 MED ORDER — FLUOXETINE HCL 10 MG PO CAPS
10.0000 mg | ORAL_CAPSULE | Freq: Every day | ORAL | Status: DC
  Filled 2015-07-30 (×4): qty 1

## 2015-07-30 MED ORDER — LISINOPRIL 5 MG PO TABS
5.0000 mg | ORAL_TABLET | Freq: Every day | ORAL | Status: DC
  Administered 2015-07-30: 5 mg via ORAL
  Filled 2015-07-30: qty 1

## 2015-07-30 MED ORDER — DEXTROSE 5 % IV SOLN
2.0000 g | Freq: Two times a day (BID) | INTRAVENOUS | Status: DC
  Administered 2015-07-30 – 2015-07-31 (×3): 2 g via INTRAVENOUS
  Filled 2015-07-30 (×4): qty 2

## 2015-07-30 MED ORDER — IPRATROPIUM-ALBUTEROL 0.5-2.5 (3) MG/3ML IN SOLN
3.0000 mL | Freq: Four times a day (QID) | RESPIRATORY_TRACT | Status: DC
  Administered 2015-07-30: 3 mL via RESPIRATORY_TRACT
  Filled 2015-07-30 (×3): qty 3

## 2015-07-30 MED ORDER — SODIUM CHLORIDE 0.9 % IV SOLN
INTRAVENOUS | Status: AC
  Administered 2015-07-30 (×2): via INTRAVENOUS

## 2015-07-30 MED ORDER — QUETIAPINE FUMARATE 25 MG PO TABS
100.0000 mg | ORAL_TABLET | Freq: Every day | ORAL | Status: DC
  Filled 2015-07-30 (×4): qty 4

## 2015-07-30 MED ORDER — NICOTINE 14 MG/24HR TD PT24
14.0000 mg | MEDICATED_PATCH | TRANSDERMAL | Status: DC
  Administered 2015-07-30 – 2015-07-31 (×2): 14 mg via TRANSDERMAL
  Filled 2015-07-30 (×3): qty 1

## 2015-07-30 MED ORDER — VANCOMYCIN HCL 10 G IV SOLR
1250.0000 mg | Freq: Two times a day (BID) | INTRAVENOUS | Status: DC
  Administered 2015-07-30 (×2): 1250 mg via INTRAVENOUS
  Filled 2015-07-30 (×4): qty 1250

## 2015-07-30 MED ORDER — IBUPROFEN 400 MG PO TABS: 400.0000 mg | ORAL_TABLET | Freq: Four times a day (QID) | ORAL | Status: DC | PRN

## 2015-07-30 MED ORDER — NICOTINE 21 MG/24HR TD PT24: 21.0000 mg | MEDICATED_PATCH | Freq: Every day | TRANSDERMAL | Status: DC

## 2015-07-30 MED ORDER — HYDROCODONE-ACETAMINOPHEN 5-325 MG PO TABS: 2.0000 | ORAL_TABLET | ORAL | Status: DC | PRN

## 2015-07-30 MED ORDER — ONDANSETRON HCL 4 MG PO TABS: 4.0000 mg | ORAL_TABLET | Freq: Four times a day (QID) | ORAL | Status: DC | PRN

## 2015-07-30 MED ORDER — NICOTINE 14 MG/24HR TD PT24
14.0000 mg | MEDICATED_PATCH | Freq: Every day | TRANSDERMAL | Status: DC
  Administered 2015-07-30: 14 mg via TRANSDERMAL
  Filled 2015-07-30: qty 1

## 2015-07-30 MED ORDER — PIPERACILLIN-TAZOBACTAM 3.375 G IVPB
3.3750 g | Freq: Three times a day (TID) | INTRAVENOUS | Status: DC
Start: 2015-07-30 — End: 2015-07-30
  Filled 2015-07-30 (×3): qty 50

## 2015-07-30 MED ORDER — HYDROMORPHONE HCL 1 MG/ML IJ SOLN
1.5000 mg | INTRAMUSCULAR | Status: DC | PRN
  Administered 2015-07-30 (×4): 1.5 mg via INTRAVENOUS
  Filled 2015-07-30 (×4): qty 2

## 2015-07-30 MED ORDER — HEPARIN SODIUM (PORCINE) 5000 UNIT/ML IJ SOLN
5000.0000 [IU] | Freq: Three times a day (TID) | INTRAMUSCULAR | Status: DC
  Administered 2015-07-30 – 2015-08-01 (×8): 5000 [IU] via SUBCUTANEOUS
  Filled 2015-07-30 (×8): qty 1

## 2015-07-30 MED ORDER — HYDROMORPHONE HCL 1 MG/ML IJ SOLN
2.0000 mg | INTRAMUSCULAR | Status: DC | PRN
  Administered 2015-07-30 – 2015-08-01 (×14): 2 mg via INTRAVENOUS
  Filled 2015-07-30 (×14): qty 2

## 2015-07-30 MED ORDER — SODIUM CHLORIDE 0.9 % IV BOLUS (SEPSIS)
1000.0000 mL | Freq: Once | INTRAVENOUS | Status: AC
  Administered 2015-07-30: 1000 mL via INTRAVENOUS

## 2015-07-30 MED ORDER — ONDANSETRON HCL 4 MG/2ML IJ SOLN: 4.0000 mg | Freq: Four times a day (QID) | INTRAMUSCULAR | Status: DC | PRN

## 2015-07-30 MED ORDER — CYCLOBENZAPRINE HCL 10 MG PO TABS: 10.0000 mg | ORAL_TABLET | Freq: Three times a day (TID) | ORAL | Status: DC | PRN

## 2015-07-30 MED ORDER — SODIUM CHLORIDE 0.9 % IV BOLUS (SEPSIS)
500.0000 mL | Freq: Once | INTRAVENOUS | Status: AC
  Administered 2015-07-30: 500 mL via INTRAVENOUS

## 2015-07-30 NOTE — Progress Notes (Signed)
PROGRESS NOTE    Alice PereyraDoris [name]  RUE:[id number]:[id number] DOB: [birth year] DOA: 07/29/2015 PCP: Bennie PieriniMARTIN,MARY MARGARET, FNP   Brief Narrative: Alice [name] is a 50 y.o. female with medical history significant for IV drug abuse and opiate dependency, hypertension, and depression who presents the emergency department with severe neck pain of 2 weeks' duration, worsening significantly over the past 3 days. Patient was actually seen at an outside hospital emergency department on 07/11/2015 for neck pain and temperature was elevated to 37.9 C at that time. She was suspected of having possible tickborne illness and was discharged home with doxycycline. No imaging or blood work was obtained during that visit. Patient also reports a fall down stairs approximately 4 weeks ago which resulted in a fractured left rib and believes the neck pain may have originated from there. Evaluation in the ED; radiographs of the cervical spine demonstrate an and anterior C7 body fracture with mild wedging. This was followed up with MRI of the cervical spine which is positive for discitis and osteomyelitis at C6-C7 with a pathologic fracture. There is epidural Inderal inflammation extending up to vertebral bodies from the focus of infection on MRI. Neurosurgery was consulted from the emergency department and it was advised the patient be admitted for IV antibiotics and steroids,    Assessment & Plan:   Principal Problem:   Discitis Active Problems:   TOBACCO ABUSE   Opiate dependence (HCC)   Substance induced mood disorder (HCC)   Severe recurrent major depression without psychotic features (HCC)   Heart murmur   IV drug abuse   Hyponatremia   Discitis of cervical region   1. Discitis, cervical C6 -C 7  - Seen at outside ED for neck pain and low-grade temp on 07/11/15, completed course of doxy for suspected tick-borne illness at that time  - Pain worsening, radiographs with anterior C7 fracture, MRI with cervical  discitis/osteomyelitis as described above  - Neurosurgery consulted. I have called the office.  -ID consulted.  - Decadron 2 mg q8h,. -On IV cefepime and vancomycin/  - Immobilized in cervical collar  - Pain-control prn increase dilaudid to 2 mg, pain not well controlled.   2. IV drug abuse, opiate-dependency  - Social work consultation requested   3. Depression  - Pt denies SI, HI, or hallucinations, but notes acute worsening related to the current illness and pain - Continue current management with Prozac, Seroquel   4. Heart murmur  - Conspicuous murmur appreciated, not previously documented  - Concern for vegetation given pt's hx  - TTE ordered  -follow blood culture.   5-HTN; hold lisinopril   DVT prophylaxis: Lovenox.  Code Status: Full code.  Family Communication; care discussed with patient, significant other at bedside.  Disposition Plan: To be determine.    Consultants:   Neurosurgery   ID  Procedures:   ECHO pending.   Antimicrobials:   Cefepime 7-19  Vancomycin 7-18   Subjective: Complaining of neck pain. She report falling 5 weeks ago, was complaining of knee pain and neck pain and went to ED at [facility] and was send home. She has been having neck pain for last 5 weeks.   Objective: Filed Vitals:   07/29/15 2230 07/29/15 2312 07/30/15 0551 07/30/15 0934  BP: 130/83 130/69 96/55 137/85  Pulse: 84 90 72   Temp:  98.4 F (36.9 C) 97.1 F (36.2 C)   TempSrc:  Oral    Resp:  17 18   Height:  6' (1.829 m)  Weight:  58.968 kg (130 lb)    SpO2: 99% 100% 100%     Intake/Output Summary (Last 24 hours) at 07/30/15 1142 Last data filed at 07/30/15 4098  Gross per 24 hour  Intake      0 ml  Output    500 ml  Net   -500 ml   Filed Weights   07/29/15 2312  Weight: 58.968 kg (130 lb)    Examination:  General exam: Appears calm and comfortable  Respiratory system: Clear to auscultation. Respiratory effort normal. Cardiovascular system:  S1 & S2 heard, RRR. No JVD, murmurs, rubs, gallops or clicks. No pedal edema. Gastrointestinal system: Abdomen is nondistended, soft and nontender. No organomegaly or masses felt. Normal bowel sounds heard. Central nervous system: Alert and oriented. No focal neurological deficits. Extremities: Symmetric 5 x 5 power. Skin: No rashes, lesions or ulcers Psychiatry: Judgement and insight appear normal. Mood & affect appropriate.     Data Reviewed: I have personally reviewed following labs and imaging studies  CBC:  Recent Labs Lab 07/29/15 1935 07/30/15 0453  WBC 13.3* 9.8  NEUTROABS  --  8.3*  HGB 11.0* 10.4*  HCT 33.5* 32.7*  MCV 86.3 86.3  PLT 279 259   Basic Metabolic Panel:  Recent Labs Lab 07/29/15 1935 07/30/15 0453  NA 134* 137  K 3.7 3.7  CL 99* 104  CO2 29 27  GLUCOSE 97 209*  BUN 10 7  CREATININE 0.76 0.68  CALCIUM 8.9 8.6*   GFR: Estimated Creatinine Clearance: 78.4 mL/min (by C-G formula based on Cr of 0.68). Liver Function Tests:  Recent Labs Lab 07/30/15 0048  AST 17  ALT 11*  ALKPHOS 85  BILITOT 0.5  PROT 7.3  ALBUMIN 2.9*   No results for input(s): LIPASE, AMYLASE in the last 168 hours. No results for input(s): AMMONIA in the last 168 hours. Coagulation Profile:  Recent Labs Lab 07/30/15 0048  INR 1.22   Cardiac Enzymes: No results for input(s): CKTOTAL, CKMB, CKMBINDEX, TROPONINI in the last 168 hours. BNP (last 3 results) No results for input(s): PROBNP in the last 8760 hours. HbA1C: No results for input(s): HGBA1C in the last 72 hours. CBG: No results for input(s): GLUCAP in the last 168 hours. Lipid Profile: No results for input(s): CHOL, HDL, LDLCALC, TRIG, CHOLHDL, LDLDIRECT in the last 72 hours. Thyroid Function Tests: No results for input(s): TSH, T4TOTAL, FREET4, T3FREE, THYROIDAB in the last 72 hours. Anemia Panel: No results for input(s): VITAMINB12, FOLATE, FERRITIN, TIBC, IRON, RETICCTPCT in the last 72  hours. Sepsis Labs:  Recent Labs Lab 07/30/15 0048 07/30/15 0452  PROCALCITON 2.73  --   LATICACIDVEN 1.6 1.6    No results found for this or any previous visit (from the past 240 hour(s)).       Radiology Studies: Dg Chest 2 View  07/29/2015  CLINICAL DATA:  Cough. Neck pain after fall. Known rib fractures from fall last month. EXAM: CHEST  2 VIEW COMPARISON:  Left rib radiographs 06/23/2015 FINDINGS: The cardiomediastinal contours are normal. The lungs are clear. Pulmonary vasculature is normal. No consolidation, pleural effusion, or pneumothorax. The left anterior sixth rib fracture on prior rib radiographs is not well visualized on the current exam. Bilateral nipple shadows are noted. IMPRESSION: No active cardiopulmonary disease. Left anterior rib fracture on prior exam not seen currently. Electronically Signed   By: Rubye Oaks M.D.   On: 07/29/2015 17:10   Dg Cervical Spine Complete  07/29/2015  CLINICAL DATA:  Cervicalgia  following fall several weeks prior EXAM: CERVICAL SPINE - COMPLETE 4+ VIEW COMPARISON:  CT neck October 18, 2013 FINDINGS: Frontal, lateral, open-mouth odontoid, and bilateral oblique views were obtained. There is an age uncertain fracture of the anterior aspect of the C7 vertebral body which was not present on the previous study. No other fracture is evident. No spondylolisthesis. Prevertebral soft tissues and predental space regions are normal. There is moderate severe disc space narrowing at C6-7, progressed from prior study. There is moderate disc space narrowing at C5-6 and C7-T1. There are anterior osteophytes at C5, C6, and C7. There is facet hypertrophy at C5-6 and C6-7 bilaterally. IMPRESSION: Age uncertain fracture along the anterior aspect of the C7 vertebral body with mild wedging in this area. No other fracture. Multilevel arthropathy, most severe at C6-7. No spondylolisthesis. These results will be called to the ordering clinician or representative by  the Radiologist Assistant, and communication documented in the PACS or zVision Dashboard. Electronically Signed   By: Bretta Bang III M.D.   On: 07/29/2015 17:22   Mr Cervical Spine W Wo Contrast  07/29/2015  CLINICAL DATA:  50 year old female with C7 vertebral fracture on cervical spine films performed for pain after a fall several weeks ago. Initial encounter. Personal history of IV drug abuse. EXAM: MRI CERVICAL SPINE WITHOUT AND WITH CONTRAST TECHNIQUE: Multiplanar and multiecho pulse sequences of the cervical spine, to include the craniocervical junction and cervicothoracic junction, were obtained according to standard protocol without and with intravenous contrast. CONTRAST:  10mL MULTIHANCE GADOBENATE DIMEGLUMINE 529 MG/ML IV SOLN COMPARISON:  Cervical spine radiographs 1708 hours today. Neck CT 10/18/2013. FINDINGS: Endplate irregularity and compression at both C6 and C7, more pronounced at the latter. Associated vertebral endplate marrow edema. Intervening disc space loss with some areas of increased T2 and STIR signal within the disc (series 4, image 15). Associated bulky longus coli muscle edema at C6-C7 with prevertebral abnormal T2 and STIR hyperintensity tracking from the C3-C4 level into the upper thoracic spine. Associated abnormal epidural thickening and enhancement at C6-C7 (series 11, image 23). Superimposed abnormal dural thickening and enhancement extending posteriorly from C3-C4 to the cervicothoracic junction, ventrally from C5-C6 to T1-T2. No discrete epidural fluid collection. There is abnormal soft tissue inflammation in the bilateral cervical neural foramina, maximal at C6-C7. Posterior paraspinal muscle edema is mild to moderate compared to the prevertebral soft tissue edema. There is interspinous ligament edema at C5-C6 and C6-C7. There is associated multifactorial spinal stenosis at C6-C7 with mild spinal cord mass effect. No cord signal abnormality despite these changes. No  abnormal intradural enhancement. No other cervical or visualized upper thoracic spinal stenosis or spinal cord mass effect. No other levels of disc or vertebral body infection. No other marrow edema identified. Apical lung scarring. Cervicomedullary junction is within normal limits. Grossly negative visualized brain parenchyma. Preserved major vascular flow voids in the neck at this time. IMPRESSION: 1. 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 2. No abscess or drainable fluid collection identified. 3. Associated spinal stenosis with spinal cord mass effect at C6-C7. No cord signal abnormality at this time. Electronically Signed   By: Odessa Fleming M.D.   On: 07/29/2015 21:06        Scheduled Meds: . ceFEPime (MAXIPIME) IV  2 g Intravenous Q12H  . dexamethasone  2 mg Intravenous Q8H  . FLUoxetine  10 mg Oral Daily  . heparin  5,000 Units Subcutaneous Q8H  . lisinopril  5 mg Oral Daily  . QUEtiapine  100 mg Oral QHS  . sodium chloride  500 mL Intravenous Once  . vancomycin  1,250 mg Intravenous Q12H   Continuous Infusions:    LOS: 1 day    Time spent: 35 minutes.     Alba Cory, MD [facility] Pager [phone]  If 7PM-7AM, please contact night-coverage [email or web address] Password [email or web address] 07/30/2015, 11:42 AM

## 2015-07-30 NOTE — Care Management Note (Addendum)
Case Management Note  Patient Details  Name: Alice PereyraDoris [name] MRN: [id number] Date of Birth: [birth year]  Subjective/Objective:             Patient admitted from home, [place]. Patient reports that she uses illicit drugs Heroin, "Crack" cocaine, and Oxycodone (Last toxicology 05/08/15 +Cocaine). MRI of the cervical spine positive for discitis and osteomyelitis at C6-C7 with a pathologic fracture.  Neurosurgery was consulted from the emergency department and it was advised the patient be admitted for IV antibiotics and steroids. Neurosurgery and ID consulting.   Action/Plan:  Patient will need SNF if plan for long term IV Abx.  Expected Discharge Date:  08/01/15               Expected Discharge Plan:  Skilled Nursing Facility  In-House Referral:  Clinical Social Work  Discharge planning Services  CM Consult  Post Acute Care Choice:    Choice offered to:     DME Arranged:    DME Agency:     HH Arranged:    HH Agency:     Status of Service:  In process, will continue to follow  If discussed at Long Length of Stay Meetings, dates discussed:    Additional Comments:  Lawerance SabalDebbie Murice Barbar, RN 07/30/2015, 11:44 AM

## 2015-07-30 NOTE — Consult Note (Signed)
[facility]       Reason for Consult: discitis    Referring Physician: Dr. Sunnie Nielsen  Principal Problem:   Discitis Active Problems:   TOBACCO ABUSE   Opiate dependence (HCC)   Substance induced mood disorder (HCC)   Severe recurrent major depression without psychotic features (HCC)   Heart murmur   IV drug abuse   Hyponatremia   Discitis of cervical region   . ceFEPime (MAXIPIME) IV  2 g Intravenous Q12H  . dexamethasone  2 mg Intravenous Q8H  . FLUoxetine  10 mg Oral Daily  . heparin  5,000 Units Subcutaneous Q8H  . nicotine  14 mg Transdermal Daily  . QUEtiapine  100 mg Oral QHS  . vancomycin  1,250 mg Intravenous Q12H    Recommendations: Continue with vancomycin and cefepime Monitor blood cultures Will need placement for continued IV antibiotics, can't go home with picc line Biopsy by neurosurgery for bacterial, AFB and fungal culture if possible  Assessment: She has discitis in cervical area in someone with active IVDU.  Broad organisms possible.   Unfortunately was already started on antibiotics so yield of culture of site would less likely to be positive.   Chronic back pain, history of fusion HIV test ordered  Antibiotics: Vancomycin and cefepime  HPI: Alice [name] is a 50 y.o. female with active IVDU of heroin, crack cocaine who initially fell about 1 month ago and went to [facility] later with complaints of neck pain.  She reported to them that she slept in a recliner and woke up with pain. She had a fever then as well.  She came back now with continued pain and MRI done notable for fracture of C7 vertebral body and discitis/osteomyelitis of C6-7.   MRI independently reviewed and noted edema  Review of Systems:  Constitutional: positive for fevers or negative for chills and anorexia Gastrointestinal: negative for diarrhea Musculoskeletal: positive for neck pain and back pain, negative for arthralgias All other systems  reviewed and are negative   Past Medical History  Diagnosis Date  . Anemia   . Heart murmur   . Hypertension   . Chronic low back pain     Social History  Substance Use Topics  . Smoking status: Current Every Day Smoker -- 0.50 packs/day  . Smokeless tobacco: None  . Alcohol Use: No    Family History  Problem Relation Age of Onset  . Hypertension Mother   . Cancer Father     Allergies  Allergen Reactions  . Codeine Rash  . Tetracycline Other (See Comments)    Physical Exam: Constitutional: alert; tearful Filed Vitals:   07/30/15 0551 07/30/15 0934  BP: 96/55 137/85  Pulse: 72   Temp: 97.1 F (36.2 C)   Resp: 18    EYES: anicteric ENMT: no thrush Cardiovascular: Cor RRR Respiratory: clear; GI: Bowel sounds are normal, liver is not enlarged, spleen is not enlarged Musculoskeletal: no pedal edema noted Skin: negatives: no rash, + tattoo, + needle marks Hematologic: no cervical lad  Lab Results  Component Value Date   WBC 9.8 07/30/2015   HGB 10.4* 07/30/2015   HCT 32.7* 07/30/2015   MCV 86.3 07/30/2015   PLT 259 07/30/2015    Lab Results  Component Value Date   CREATININE 0.68 07/30/2015   BUN 7 07/30/2015   NA 137 07/30/2015   K 3.7 07/30/2015   CL 104 07/30/2015   CO2 27 07/30/2015    Lab Results  Component Value Date   ALT 11* 07/30/2015   AST 17 07/30/2015   ALKPHOS 85 07/30/2015     Microbiology: No results found for this or any previous visit (from the past 240 hour(s)).  [name], Camry Robello, MD [facility] [email or web address] [phone] pager  [phone] cell 07/30/2015, 1:25 PM

## 2015-07-30 NOTE — Progress Notes (Signed)
  Pharmacy Antibiotic Note Alice [name] is a 50 y.o. female admitted on 07/29/2015 with Discitis/osteomyelitis.  Pharmacy has been consulted for vancomycin and Cefepime dosing.  Plan: Vancomycin 1250 mg IV every 12 hours.  Goal trough 15-20 mcg/mL. Cefepime 2g IV Q12H for bone penetration  Height: 6' (182.9 cm) Weight: 130 lb (58.968 kg) (per previous weight- pain crying of pain with movement ) IBW/kg (Calculated) : 73.1  Temp (24hrs), Avg:98 F (36.7 C), Min:97 F (36.1 C), Max:99.5 F (37.5 C)   Recent Labs Lab 07/29/15 1935 07/30/15 0048 07/30/15 0452 07/30/15 0453  WBC 13.3*  --   --  9.8  CREATININE 0.76  --   --  0.68  LATICACIDVEN  --  1.6 1.6  --     Estimated Creatinine Clearance: 78.4 mL/min (by C-G formula based on Cr of 0.68).    Allergies  Allergen Reactions  . Codeine Rash  . Tetracycline Other (See Comments)    Antimicrobials this admission: Vancomycin 7/18 >>  Cefepime 7/18 >>   Dose adjustments this admission: Adjust vanc based on indication. Pop Vd=41.3, Pop Ke=0081, t1/2=8.6 Anticipated trough=19.2  Microbiology results: 7/18 BCx: px 7/19 UA: rare bacteria, elevated glucose, small leukocytes, 0-5 squamous   Thank you for allowing pharmacy to be a part of this patient's care.  Alfredo BachJoseph Arminger, Cleotis NipperBS, PharmD Clinical Pharmacy Resident [phone] (Pager) 07/30/2015 9:23 AM

## 2015-07-30 NOTE — NC FL2 (Signed)
[place] MEDICAID FL2 LEVEL OF CARE SCREENING TOOL     IDENTIFICATION  Patient Name: Alice [name] Birthdate: Aug 09, 1965 Sex: female Admission Date (Current Location): 07/29/2015  [facility] [place] [place]   Facility and Address:  [facility], [address], [place], [place] [address]      Provider Number: [id number]  Attending Physician Name and Address:  Alba CoryBelkys A Regalado, MD  Relative Name and Phone Number:  Sallye OberLouise, mother, [phone]    Current Level of Care: Hospital Recommended Level of Care: Skilled Nursing Facility Prior Approval Number:    Date Approved/Denied:   PASRR Number: [id number]  Discharge Plan: SNF    Current Diagnoses: Patient Active Problem List   Diagnosis Date Noted  . Heart murmur 07/30/2015  . IV drug abuse 07/30/2015  . Hyponatremia 07/30/2015  . Discitis of cervical region 07/30/2015  . Discitis 07/29/2015  . Opioid dependence with withdrawal (HCC)   . Substance induced mood disorder (HCC) 05/08/2015  . 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 Nicotine abuse 10/30/2013  . Opiate dependence (HCC) 07/30/2011    Class: Acute  . CHEST PAIN, PRECORDIAL 02/18/2010  . ANEMIA 12/05/2009  . ANXIETY 12/05/2009  . TOBACCO ABUSE 12/05/2009  . PALPITATIONS 12/05/2009    Orientation RESPIRATION BLADDER Height & Weight     Self, Situation, Time, Place  Normal Continent Weight: 58.968 kg (130 lb) (per previous weight- pain crying of pain with movement ) Height:  6' (182.9 cm)  BEHAVIORAL SYMPTOMS/MOOD NEUROLOGICAL BOWEL NUTRITION STATUS      Continent Diet (Please see DC Summary)  AMBULATORY STATUS COMMUNICATION OF NEEDS Skin   Supervision Verbally Normal                       Personal Care Assistance Level of Assistance  Bathing, Feeding, Dressing Bathing Assistance: Limited assistance Feeding assistance: Independent Dressing Assistance: Limited  assistance     Functional Limitations Info             SPECIAL CARE FACTORS FREQUENCY                       Contractures      Additional Factors Info  Code Status, Allergies Code Status Info: Full Allergies Info: Codeine, Tetracycline           Current Medications (07/30/2015):  This is the current hospital active medication list Current Facility-Administered Medications  Medication Dose Route Frequency Provider Last Rate Last Dose  . ceFEPIme (MAXIPIME) 2 g in dextrose 5 % 50 mL IVPB  2 g Intravenous Q12H [name], RPH   2 g at 07/30/15 1119  . cyclobenzaprine (FLEXERIL) tablet 10 mg  10 mg Oral TID PRN Briscoe Deutscherimothy S Opyd, MD      . dexamethasone (DECADRON) injection 2 mg  2 mg Intravenous Q8H Audry Piliyler Mohr, PA-C   2 mg at 07/30/15 1316  . FLUoxetine (PROZAC) capsule 10 mg  10 mg Oral Daily Briscoe Deutscherimothy S Opyd, MD   10 mg at 07/30/15 0936  . heparin injection 5,000 Units  5,000 Units Subcutaneous Q8H Briscoe Deutscherimothy S Opyd, MD   5,000 Units at 07/30/15 1316  . HYDROcodone-acetaminophen (NORCO/VICODIN) 5-325 MG per tablet 2 tablet  2 tablet Oral Q4H PRN Briscoe Deutscherimothy S Opyd, MD      . HYDROmorphone (DILAUDID) injection 2 mg  2 mg Intravenous Q3H PRN Belkys A Regalado, MD   2 mg at 07/30/15 1719  .  ibuprofen (ADVIL,MOTRIN) tablet 400 mg  400 mg Oral Q6H PRN Briscoe Deutscher, MD      . nicotine (NICODERM CQ - dosed in mg/24 hours) patch 14 mg  14 mg Transdermal Daily Belkys A Regalado, MD   14 mg at 07/30/15 1316  . ondansetron (ZOFRAN) tablet 4 mg  4 mg Oral Q6H PRN Briscoe Deutscher, MD       Or  . ondansetron (ZOFRAN) injection 4 mg  4 mg Intravenous Q6H PRN Briscoe Deutscher, MD      . QUEtiapine (SEROQUEL) tablet 100 mg  100 mg Oral QHS Briscoe Deutscher, MD   100 mg at 07/30/15 0121  . vancomycin (VANCOCIN) 1,250 mg in sodium chloride 0.9 % 250 mL IVPB  1,250 mg Intravenous Q12H [name], RPH   1,250 mg at 07/30/15 1443     Discharge Medications: Please see discharge summary for a  list of discharge medications.  Relevant Imaging Results:  Relevant Lab Results:   Additional Information SSN: [id number] [place], [place]

## 2015-07-30 NOTE — Progress Notes (Signed)
Pt.requested  to go to vending machine ,& standby assisted pt.to go to the vending machine & she got upset  Bec.she said she is like in a jail watching her all the time ,but explained to her the Ijust gave her pain med.& it is for her own safety & she got more upset & went back to her room & bang the door.

## 2015-07-30 NOTE — Consult Note (Signed)
CC:  Chief Complaint  Patient presents with  . Neck Pain    HPI: Alice [name] is a 50 y.o. female with C6-7 spondylodiscitis.  She sustained a fall 5 weeks ago and has had neck pain since then.  She denies weakness in her arms or legs.  She says she has some sensory changes in her right foot.  She has not had fevers or chills.  She had an Xray that showed a C7 fracture.  She then had an MRI that showed C6-7 spondylodiscitis.  PMH: Past Medical History  Diagnosis Date  . Anemia   . Heart murmur   . Hypertension   . Chronic low back pain     PSH: Past Surgical History  Procedure Laterality Date  . Back surgery      hardware in back  . Abdominal hysterectomy      abd?  . Tumor reomoved from stomach    . Cyst removed from ovary    . Removal of bb in finger  2008  . Salivary gland surgery    . Fracture surgery      LUMBAR SPINE    SH: Social History  Substance Use Topics  . Smoking status: Current Every Day Smoker -- 0.50 packs/day  . Smokeless tobacco: None  . Alcohol Use: No    MEDS: Prior to Admission medications   Medication Sig Start Date End Date Taking? Authorizing Provider  ibuprofen (ADVIL,MOTRIN) 200 MG tablet Take 400 mg by mouth every 6 (six) hours as needed.   Yes Historical Provider, MD  lisinopril (PRINIVIL,ZESTRIL) 5 MG tablet Take 2 tablets (10 mg total) by mouth daily. For high blood pressure. Patient taking differently: Take 5 mg by mouth daily. For high blood pressure. 05/12/15  Yes Sanjuana Kava, NP  cyclobenzaprine (FLEXERIL) 10 MG tablet Take 1 tablet (10 mg total) by mouth 3 (three) times daily as needed for muscle spasms. Patient not taking: Reported on 07/29/2015 07/11/15   Devoria Albe, MD  FLUoxetine (PROZAC) 10 MG capsule Take 1 capsule (10 mg total) by mouth daily. For depression Patient not taking: Reported on 07/29/2015 05/12/15   Sanjuana Kava, NP  hydrOXYzine (ATARAX/VISTARIL) 50 MG tablet Take 1 tablet (50 mg total) by mouth every 6 (six) hours as  needed for anxiety. Patient not taking: Reported on 07/29/2015 05/12/15   Sanjuana Kava, NP  methocarbamol (ROBAXIN) 500 MG tablet Take 1 tablet (500 mg total) by mouth every 6 (six) hours as needed for muscle spasms. Patient not taking: Reported on 07/29/2015 05/12/15   Sanjuana Kava, NP  QUEtiapine (SEROQUEL) 100 MG tablet Take 1 tablet (100 mg total) by mouth at bedtime. For mood control Patient not taking: Reported on 07/29/2015 05/12/15   Sanjuana Kava, NP  traZODone (DESYREL) 100 MG tablet Take 1 tablet (100 mg total) by mouth at bedtime and may repeat dose one time if needed. For sleep Patient not taking: Reported on 07/29/2015 05/12/15   Sanjuana Kava, NP    ALLERGY: Allergies  Allergen Reactions  . Codeine Rash  . Tetracycline Other (See Comments)    ROS: ROS  NEUROLOGIC EXAM: Awake, alert, oriented Memory and concentration grossly intact Speech fluent, appropriate CN grossly intact Motor exam: Upper Extremities Deltoid Bicep Tricep Grip  Right 5/5 5/5 5/5 5/5  Left 5/5 5/5 5/5 5/5   Lower Extremity IP Quad PF DF EHL  Right 5/5 5/5 5/5 5/5 5/5  Left 5/5 5/5 5/5 5/5 5/5   Sensation  grossly intact to LT  IMAGING: Enhancement of C6 and C7 vertebral bodies with some osteolysis.  There is epidural enhancement but no abscess.  IMPRESSION: - 50 y.o. female with C6-7 spondylodiscitis.  She is neurologically intact to objective testing.  PLAN: - No role for surgery at this time. - Treat empirically with antibiotics - Continue steroids for 3 days - Keep in C-collar - Repeat AP/lateral cervical films in 6 weeks - Only role for surgery will be if she develops neurological deficits or kyphotic deformity

## 2015-07-30 NOTE — Progress Notes (Signed)
PHARMACY - PHYSICIAN COMMUNICATION CRITICAL VALUE ALERT - BLOOD CULTURE IDENTIFICATION (BCID)  Results for orders placed or performed during the hospital encounter of 07/29/15  Blood Culture ID Panel (Reflexed) (Collected: 07/29/2015  9:35 PM)  Result Value Ref Range   Enterococcus species NOT DETECTED NOT DETECTED   Vancomycin resistance NOT DETECTED NOT DETECTED   Listeria monocytogenes NOT DETECTED NOT DETECTED   Staphylococcus species NOT DETECTED NOT DETECTED   Staphylococcus aureus NOT DETECTED NOT DETECTED   Methicillin resistance NOT DETECTED NOT DETECTED   Streptococcus species DETECTED (A) NOT DETECTED   Streptococcus agalactiae NOT DETECTED NOT DETECTED   Streptococcus pneumoniae NOT DETECTED NOT DETECTED   Streptococcus pyogenes NOT DETECTED NOT DETECTED   Acinetobacter baumannii NOT DETECTED NOT DETECTED   Enterobacteriaceae species NOT DETECTED NOT DETECTED   Enterobacter cloacae complex NOT DETECTED NOT DETECTED   Escherichia coli NOT DETECTED NOT DETECTED   Klebsiella oxytoca NOT DETECTED NOT DETECTED   Klebsiella pneumoniae NOT DETECTED NOT DETECTED   Proteus species NOT DETECTED NOT DETECTED   Serratia marcescens NOT DETECTED NOT DETECTED   Carbapenem resistance NOT DETECTED NOT DETECTED   Haemophilus influenzae NOT DETECTED NOT DETECTED   Neisseria meningitidis NOT DETECTED NOT DETECTED   Pseudomonas aeruginosa NOT DETECTED NOT DETECTED   Candida albicans NOT DETECTED NOT DETECTED   Candida glabrata NOT DETECTED NOT DETECTED   Candida krusei NOT DETECTED NOT DETECTED   Candida parapsilosis NOT DETECTED NOT DETECTED   Candida tropicalis NOT DETECTED NOT DETECTED   BCID: (+) streptococcus species, no further speciation available at this time  Changes to prescribed antibiotics required: None  Baldemar FridayMasters, Qiana Landgrebe M 07/30/2015  5:33 PM

## 2015-07-30 NOTE — H&P (Signed)
History and Physical    Alice [name] ZOX:[id number] DOB: [birth year] DOA: 07/29/2015  PCP: Bennie Pierini, FNP   Patient coming from: Home   Chief Complaint: Neck pain   HPI: Alice [name] is a 50 y.o. female with medical history significant for IV drug abuse and opiate dependency, hypertension, and depression who presents the emergency department with severe neck pain of 2 weeks' duration, worsening significantly over the past 3 days. Patient was actually seen at an outside hospital emergency department on 07/11/2015 for neck pain and temperature was elevated to 37.9 C at that time. She was suspected of having possible tickborne illness and was discharged home with doxycycline. No imaging or blood work was obtained during that visit. Patient also reports a fall down stairs approximately 4 weeks ago which resulted in a fractured left rib and believes the neck pain may have originated from there. She endorses occasional chills, but no fevers per se. She denies chest pain, palpitations, cough, or dyspnea. Patient also denies rash. She denies headache, vision or hearing change, loss of coordination, or focal numbness or weakness. She describes her pain as severe, radiating across both shoulder blades, worse with movement, and with no alleviating factors identified. She has never had similar symptoms.   ED Course: Upon arrival to the ED, patient is found to be afebrile, saturating well on room air, tachycardic to the 130s, and with stable blood pressure. Chest x-ray was negative for acute cardiopulmonary disease and radiographs of the cervical spine demonstrate an and anterior C7 body fracture with mild wedging. This was followed up with MRI of the cervical spine which is positive for discitis and osteomyelitis at C6-C7 with a pathologic fracture. There is epidural Inderal inflammation extending up to vertebral bodies from the focus of infection on MRI. Chemistry panel featured a mild hyponatremia and  hypochloremia and CBC is notable for a leukocytosis to 13,300 and a normocytic anemia with hemoglobin of 11.0. Neurosurgery was consulted from the emergency department and it was advised the patient be admitted for IV antibiotics and steroids, with neurosurgeon to evaluate in the morning. A cervical collar was placed and Decadron intravenously. Blood cultures were obtained and empiric treatment was initiated with vancomycin and Zosyn. Symptomatic care was provided with Ativan and morphine. Patient will be admitted to the medical/surgical unit for ongoing evaluation and management of cervical discitis.  Review of Systems:  All other systems reviewed and apart from HPI, are negative.  Past Medical History  Diagnosis Date  . Anemia   . Heart murmur   . Hypertension   . Chronic low back pain     Past Surgical History  Procedure Laterality Date  . Back surgery      hardware in back  . Abdominal hysterectomy      abd?  . Tumor reomoved from stomach    . Cyst removed from ovary    . Removal of bb in finger  2008  . Salivary gland surgery    . Fracture surgery      LUMBAR SPINE     reports that she has been smoking.  She does not have any smokeless tobacco history on file. She reports that she uses illicit drugs (Heroin, "Crack" cocaine, and Oxycodone). She reports that she does not drink alcohol.  Allergies  Allergen Reactions  . Codeine Rash  . Tetracycline Other (See Comments)    Family History  Problem Relation Age of Onset  . Hypertension Mother   . Cancer Father  Prior to Admission medications   Medication Sig Start Date End Date Taking? Authorizing Provider  ibuprofen (ADVIL,MOTRIN) 200 MG tablet Take 400 mg by mouth every 6 (six) hours as needed.   Yes Historical Provider, MD  lisinopril (PRINIVIL,ZESTRIL) 5 MG tablet Take 2 tablets (10 mg total) by mouth daily. For high blood pressure. Patient taking differently: Take 5 mg by mouth daily. For high blood pressure.  05/12/15  Yes Sanjuana Kava, NP  cyclobenzaprine (FLEXERIL) 10 MG tablet Take 1 tablet (10 mg total) by mouth 3 (three) times daily as needed for muscle spasms. Patient not taking: Reported on 07/29/2015 07/11/15   Devoria Albe, MD  FLUoxetine (PROZAC) 10 MG capsule Take 1 capsule (10 mg total) by mouth daily. For depression Patient not taking: Reported on 07/29/2015 05/12/15   Sanjuana Kava, NP  hydrOXYzine (ATARAX/VISTARIL) 50 MG tablet Take 1 tablet (50 mg total) by mouth every 6 (six) hours as needed for anxiety. Patient not taking: Reported on 07/29/2015 05/12/15   Sanjuana Kava, NP  methocarbamol (ROBAXIN) 500 MG tablet Take 1 tablet (500 mg total) by mouth every 6 (six) hours as needed for muscle spasms. Patient not taking: Reported on 07/29/2015 05/12/15   Sanjuana Kava, NP  QUEtiapine (SEROQUEL) 100 MG tablet Take 1 tablet (100 mg total) by mouth at bedtime. For mood control Patient not taking: Reported on 07/29/2015 05/12/15   Sanjuana Kava, NP  traZODone (DESYREL) 100 MG tablet Take 1 tablet (100 mg total) by mouth at bedtime and may repeat dose one time if needed. For sleep Patient not taking: Reported on 07/29/2015 05/12/15   Sanjuana Kava, NP    Physical Exam: Filed Vitals:   07/29/15 1849 07/29/15 2208 07/29/15 2230 07/29/15 2312  BP: 153/106 136/82 130/83 130/69  Pulse: 89 88 84 90  Temp: 99.5 F (37.5 C)   98.4 F (36.9 C)  TempSrc: Oral   Oral  Resp: Height:    6' (1.829 m)  Weight:    58.968 kg (130 lb)  SpO2: 100% 99% 99% 100%      Constitutional: NAD, in apparent discomfort, tearful  Eyes: PERTLA, lids and conjunctivae normal ENMT: Mucous membranes are moist. Posterior pharynx clear of any exudate or lesions.   Neck: In cervical collar; midline tenderness at cervical spine Respiratory: clear to auscultation bilaterally, no wheezing, no crackles. Normal respiratory effort.  Cardiovascular: S1 & S2 heard, regular rate and rhythm, grade 3 sytolic/early-diastolic murmur at  lower sternal border. No extremity edema. Skin dry. Abdomen: No distension, no tenderness, no masses palpated. Bowel sounds normal.  Musculoskeletal: no clubbing / cyanosis. No joint deformity upper and lower extremities. Normal muscle tone.  Skin: no significant rashes, lesions, ulcers. Warm, dry, well-perfused. Neurologic: CN 2-12 grossly intact. Sensation intact, DTR normal. Strength 5/5 in all 4 limbs.  Psychiatric: Normal judgment and insight. Alert and oriented x 3. Agitated, tearful.     Labs on Admission: I have personally reviewed following labs and imaging studies  CBC:  Recent Labs Lab 07/29/15 1935  WBC 13.3*  HGB 11.0*  HCT 33.5*  MCV 86.3  PLT 279   Basic Metabolic Panel:  Recent Labs Lab 07/29/15 1935  NA 134*  K 3.7  CL 99*  CO2 29  GLUCOSE 97  BUN 10  CREATININE 0.76  CALCIUM 8.9   GFR: Estimated Creatinine Clearance: 78.4 mL/min (by C-G formula based on Cr of 0.76). Liver Function Tests: No results for  input(s): AST, ALT, ALKPHOS, BILITOT, PROT, ALBUMIN in the last 168 hours. No results for input(s): LIPASE, AMYLASE in the last 168 hours. No results for input(s): AMMONIA in the last 168 hours. Coagulation Profile: No results for input(s): INR, PROTIME in the last 168 hours. Cardiac Enzymes: No results for input(s): CKTOTAL, CKMB, CKMBINDEX, TROPONINI in the last 168 hours. BNP (last 3 results) No results for input(s): PROBNP in the last 8760 hours. HbA1C: No results for input(s): HGBA1C in the last 72 hours. CBG: No results for input(s): GLUCAP in the last 168 hours. Lipid Profile: No results for input(s): CHOL, HDL, LDLCALC, TRIG, CHOLHDL, LDLDIRECT in the last 72 hours. Thyroid Function Tests: No results for input(s): TSH, T4TOTAL, FREET4, T3FREE, THYROIDAB in the last 72 hours. Anemia Panel: No results for input(s): VITAMINB12, FOLATE, FERRITIN, TIBC, IRON, RETICCTPCT in the last 72 hours. Urine analysis:    Component Value Date/Time    COLORURINE AMBER* 07/31/2011 1059   APPEARANCEUR CLOUDY* 07/31/2011 1059   LABSPEC 1.036* 07/31/2011 1059   PHURINE 5.5 07/31/2011 1059   GLUCOSEU NEGATIVE 07/31/2011 1059   HGBUR NEGATIVE 07/31/2011 1059   BILIRUBINUR neg 10/30/2013 1427   BILIRUBINUR SMALL* 07/31/2011 1059   KETONESUR TRACE* 07/31/2011 1059   PROTEINUR 4+ 10/30/2013 1427   PROTEINUR NEGATIVE 07/31/2011 1059   UROBILINOGEN negative 10/30/2013 1427   UROBILINOGEN 1.0 07/31/2011 1059   NITRITE pos+ 10/30/2013 1427   NITRITE NEGATIVE 07/31/2011 1059   LEUKOCYTESUR large (3+) 10/30/2013 1427   Sepsis Labs: @LABRCNTIP (procalcitonin:4,lacticidven:4) )No results found for this or any previous visit (from the past 240 hour(s)).   Radiological Exams on Admission: Dg Chest 2 View  07/29/2015  CLINICAL DATA:  Cough. Neck pain after fall. Known rib fractures from fall last month. EXAM: CHEST  2 VIEW COMPARISON:  Left rib radiographs 06/23/2015 FINDINGS: The cardiomediastinal contours are normal. The lungs are clear. Pulmonary vasculature is normal. No consolidation, pleural effusion, or pneumothorax. The left anterior sixth rib fracture on prior rib radiographs is not well visualized on the current exam. Bilateral nipple shadows are noted. IMPRESSION: No active cardiopulmonary disease. Left anterior rib fracture on prior exam not seen currently. Electronically Signed   By: Rubye OaksMelanie  Ehinger M.D.   On: 07/29/2015 17:10   Dg Cervical Spine Complete  07/29/2015  CLINICAL DATA:  Cervicalgia following fall several weeks prior EXAM: CERVICAL SPINE - COMPLETE 4+ VIEW COMPARISON:  CT neck October 18, 2013 FINDINGS: Frontal, lateral, open-mouth odontoid, and bilateral oblique views were obtained. There is an age uncertain fracture of the anterior aspect of the C7 vertebral body which was not present on the previous study. No other fracture is evident. No spondylolisthesis. Prevertebral soft tissues and predental space regions are normal. There  is moderate severe disc space narrowing at C6-7, progressed from prior study. There is moderate disc space narrowing at C5-6 and C7-T1. There are anterior osteophytes at C5, C6, and C7. There is facet hypertrophy at C5-6 and C6-7 bilaterally. IMPRESSION: Age uncertain fracture along the anterior aspect of the C7 vertebral body with mild wedging in this area. No other fracture. Multilevel arthropathy, most severe at C6-7. No spondylolisthesis. These results will be called to the ordering clinician or representative by the Radiologist Assistant, and communication documented in the PACS or zVision Dashboard. Electronically Signed   By: Bretta BangWilliam  Woodruff III M.D.   On: 07/29/2015 17:22   Mr Cervical Spine W Wo Contrast  07/29/2015  CLINICAL DATA:  50 year old female with C7 vertebral fracture on cervical spine  films performed for pain after a fall several weeks ago. Initial encounter. Personal history of IV drug abuse. EXAM: MRI CERVICAL SPINE WITHOUT AND WITH CONTRAST TECHNIQUE: Multiplanar and multiecho pulse sequences of the cervical spine, to include the craniocervical junction and cervicothoracic junction, were obtained according to standard protocol without and with intravenous contrast. CONTRAST:  10mL MULTIHANCE GADOBENATE DIMEGLUMINE 529 MG/ML IV SOLN COMPARISON:  Cervical spine radiographs 1708 hours today. Neck CT 10/18/2013. FINDINGS: Endplate irregularity and compression at both C6 and C7, more pronounced at the latter. Associated vertebral endplate marrow edema. Intervening disc space loss with some areas of increased T2 and STIR signal within the disc (series 4, image 15). Associated bulky longus coli muscle edema at C6-C7 with prevertebral abnormal T2 and STIR hyperintensity tracking from the C3-C4 level into the upper thoracic spine. Associated abnormal epidural thickening and enhancement at C6-C7 (series 11, image 23). Superimposed abnormal dural thickening and enhancement extending posteriorly from  C3-C4 to the cervicothoracic junction, ventrally from C5-C6 to T1-T2. No discrete epidural fluid collection. There is abnormal soft tissue inflammation in the bilateral cervical neural foramina, maximal at C6-C7. Posterior paraspinal muscle edema is mild to moderate compared to the prevertebral soft tissue edema. There is interspinous ligament edema at C5-C6 and C6-C7. There is associated multifactorial spinal stenosis at C6-C7 with mild spinal cord mass effect. No cord signal abnormality despite these changes. No abnormal intradural enhancement. No other cervical or visualized upper thoracic spinal stenosis or spinal cord mass effect. No other levels of disc or vertebral body infection. No other marrow edema identified. Apical lung scarring. Cervicomedullary junction is within normal limits. Grossly negative visualized brain parenchyma. Preserved major vascular flow voids in the neck at this time. IMPRESSION: 1. 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 2. No abscess or drainable fluid collection identified. 3. Associated spinal stenosis with spinal cord mass effect at C6-C7. No cord signal abnormality at this time. Electronically Signed   By: Odessa Fleming M.D.   On: 07/29/2015 21:06    EKG: Ordered and pending.   Assessment/Plan  1. Discitis, cervical  - Seen at outside ED for neck pain and low-grade temp on 07/11/15, completed course of doxy for suspected tick-borne illness at that time  - Pain worsening, radiographs with anterior C7 fracture, MRI with cervical discitis/osteomyelitis as described above  - Neurosurgery is consulting and much appreciated  - Decadron 2 mg q8h, empiric vancomycin and Zosyn while awaiting culture data  - Immobilized in cervical collar  - Pain-control prn    2. IV drug abuse, opiate-dependency  -  Social work consultation requested   3. Depression  - Pt denies SI, HI, or hallucinations, but notes acute worsening related to the current illness and pain - Continue current management with Prozac, Seroquel   4. Heart murmur  - Conspicuous murmur appreciated, not previously documented  - Concern for vegetation given pt's hx  - TTE ordered    DVT prophylaxis: sq heparin  Code Status: Full  Family Communication: Significant other updated at bedside  Disposition Plan: Admit to med-surg Consults called: Neurosurgery  Admission status: Inpatient     Briscoe Deutscher, MD [facility] Pager [phone]  If 7PM-7AM, please contact night-coverage [email or web address] Password [email or web address]  07/30/2015, 12:40 AM

## 2015-07-31 ENCOUNTER — Inpatient Hospital Stay (HOSPITAL_COMMUNITY): Payer: Medicaid Other

## 2015-07-31 DIAGNOSIS — R011 Cardiac murmur, unspecified: Secondary | ICD-10-CM

## 2015-07-31 DIAGNOSIS — F1910 Other psychoactive substance abuse, uncomplicated: Secondary | ICD-10-CM

## 2015-07-31 LAB — CBC
HCT: 31.7 % — ABNORMAL LOW (ref 36.0–46.0)
Hemoglobin: 9.8 g/dL — ABNORMAL LOW (ref 12.0–15.0)
MCH: 27 pg (ref 26.0–34.0)
MCHC: 30.9 g/dL (ref 30.0–36.0)
MCV: 87.3 fL (ref 78.0–100.0)
PLATELETS: 287 10*3/uL (ref 150–400)
RBC: 3.63 MIL/uL — AB (ref 3.87–5.11)
RDW: 13.7 % (ref 11.5–15.5)
WBC: 12.2 10*3/uL — ABNORMAL HIGH (ref 4.0–10.5)

## 2015-07-31 LAB — ECHOCARDIOGRAM COMPLETE
AVLVOTPG: 6 mmHg
CHL CUP DOP CALC LVOT VTI: 27.8 cm
CHL CUP LVOT MV VTI INDEX: 1 cm2/m2
CHL CUP LVOT MV VTI: 1.77
CHL CUP MV DEC (S): 222
CHL CUP MV M VEL: 115
E decel time: 222 msec
E/e' ratio: 18.87
FS: 48 % — AB (ref 28–44)
HEIGHTINCHES: 72 in
IV/PV OW: 0.87
LA diam end sys: 39 mm
LA diam index: 2.2 cm/m2
LA vol A4C: 62.8 ml
LASIZE: 39 mm
LDCA: 3.14 cm2
LV E/e' medial: 18.87
LV E/e'average: 18.87
LVELAT: 8.16 cm/s
LVOT SV: 87 mL
LVOT peak vel: 125 cm/s
LVOTD: 20 mm
Lateral S' vel: 15.7 cm/s
MV Annulus VTI: 49.3 cm
MV Peak grad: 9 mmHg
MV pk E vel: 154 m/s
MVG: 6 mmHg
MVPKAVEL: 123 m/s
PW: 10.1 mm — AB (ref 0.6–1.1)
TAPSE: 26.1 mm
TDI e' lateral: 8.16
TDI e' medial: 10.8
WEIGHTICAEL: 2080 [oz_av]

## 2015-07-31 LAB — BASIC METABOLIC PANEL
Anion gap: 5 (ref 5–15)
BUN: 7 mg/dL (ref 6–20)
CO2: 28 mmol/L (ref 22–32)
CREATININE: 0.56 mg/dL (ref 0.44–1.00)
Calcium: 8.8 mg/dL — ABNORMAL LOW (ref 8.9–10.3)
Chloride: 106 mmol/L (ref 101–111)
GFR calc Af Amer: >60 mL/min (ref >60)
GLUCOSE: 152 mg/dL — AB (ref 65–99)
POTASSIUM: 3.6 mmol/L (ref 3.5–5.1)
Sodium: 139 mmol/L (ref 135–145)

## 2015-07-31 LAB — HIV ANTIBODY (ROUTINE TESTING W REFLEX): HIV Screen 4th Generation wRfx: NONREACTIVE

## 2015-07-31 MED ORDER — OXYCODONE-ACETAMINOPHEN 5-325 MG PO TABS
1.0000 | ORAL_TABLET | ORAL | Status: DC | PRN
  Administered 2015-07-31 – 2015-08-02 (×5): 2 via ORAL
  Filled 2015-07-31 (×5): qty 2

## 2015-07-31 MED ORDER — DEXTROSE 5 % IV SOLN
2.0000 g | INTRAVENOUS | Status: DC
  Administered 2015-07-31 – 2015-08-01 (×2): 2 g via INTRAVENOUS
  Filled 2015-07-31 (×3): qty 2

## 2015-07-31 MED ORDER — POLYETHYLENE GLYCOL 3350 17 G PO PACK
17.0000 g | PACK | Freq: Two times a day (BID) | ORAL | Status: DC
  Filled 2015-07-31 (×2): qty 1

## 2015-07-31 MED ORDER — IPRATROPIUM-ALBUTEROL 0.5-2.5 (3) MG/3ML IN SOLN: 3.0000 mL | RESPIRATORY_TRACT | Status: DC | PRN

## 2015-07-31 NOTE — Progress Notes (Addendum)
CSW spoke with pt concerning MD recommendation for continuing IV antibiotics at time of DC and need to go to SNF for PICC line maintence and IV antibiotic administration.  Pt states that she is NOT agreeable to SNF- states that her daughter has terminal cancer and that she needs to go home to be with her during her remaining time.  CSW informed pt that she would likely not be able to get IV antibiotics at home and asked if the choice was between going to a facility and getting the most affective treatment and going home with less affective PO treatment what would she do? Pt states that if this were the case she would still plan on going home.  Pt also inquiring about food vouchers for fiance who is at bedside- states that they have no transportation or money and requests food vouchers- CSW supervisor approved 4 vouchers- provided to pt fiance  No further CSW needs at this time- CSW signing off  Merlyn LotJenna Holoman, Medinasummit Ambulatory Surgery CenterCSWA Clinical Social Worker [phone]

## 2015-07-31 NOTE — Progress Notes (Signed)
PROGRESS NOTE    Alice [name]  ZOX:[id number] DOB: 03-20-1965 DOA: 07/29/2015 PCP: Bennie Pierini, FNP   Brief Narrative: Alice [name] is a 50 y.o. female with medical history significant for IV drug abuse and opiate dependency, hypertension, and depression who presents the emergency department with severe neck pain of 2 weeks' duration, worsening significantly over the past 3 days. Patient was actually seen at an outside hospital emergency department on 07/11/2015 for neck pain and temperature was elevated to 37.9 C at that time. She was suspected of having possible tickborne illness and was discharged home with doxycycline. No imaging or blood work was obtained during that visit. Patient also reports a fall down stairs approximately 4 weeks ago which resulted in a fractured left rib and believes the neck pain may have originated from there. Evaluation in the ED; radiographs of the cervical spine demonstrate an and anterior C7 body fracture with mild wedging. This was followed up with MRI of the cervical spine which is positive for discitis and osteomyelitis at C6-C7 with a pathologic fracture. There is epidural Inderal inflammation extending up to vertebral bodies from the focus of infection on MRI. Neurosurgery was consulted from the emergency department and it was advised the patient be admitted for IV antibiotics and steroids,    Assessment & Plan:   Principal Problem:   Discitis Active Problems:   TOBACCO ABUSE   Opiate dependence (HCC)   Substance induced mood disorder (HCC)   Severe recurrent major depression without psychotic features (HCC)   Heart murmur   IV drug abuse   Hyponatremia   Discitis of cervical region   1. Discitis, cervical C6 -C 7  - Seen at outside ED for neck pain and low-grade temp on 07/11/15, completed course of doxy for suspected tick-borne illness at that time  - Pain worsening, radiographs with anterior C7 fracture, MRI with cervical  discitis/osteomyelitis as described above  - Neurosurgery consulted. Recommending IV antibiotics and cervical collar.  -ID consulted.  - Decadron 2 mg q8h,.day 2/3.  -On IV cefepime and vancomycin/  - Immobilized in cervical collar  - Pain-control prn IV dilaudid, start oral percocet with goal to titrate of IV dilaudid.  -blood culture growing strep.   2. IV drug abuse, opiate-dependency  - Social work consultation requested   3. Depression  - Pt denies SI, HI, or hallucinations, but notes acute worsening related to the current illness and pain - Continue current management with Prozac, Seroquel   4. Heart murmur  - Conspicuous murmur appreciated, not previously documented  - Concern for vegetation given pt's hx  - TTE ordered  -follow blood culture.   5-HTN; hold lisinopril   DVT prophylaxis: Lovenox.  Code Status: Full code.  Family Communication; care discussed with patient, significant other at bedside.  Disposition Plan: To be determine.    Consultants:   Neurosurgery   ID  Procedures:   ECHO pending.   Antimicrobials:   Cefepime 7-19  Vancomycin 7-18   Subjective: Still with significant neck pain, she does not wants to go to SNF for IV antibiotics. She wants to go home.   Objective: Filed Vitals:   07/30/15 1417 07/30/15 2232 07/30/15 2239 07/31/15 0620  BP: 119/73  121/77 112/63  Pulse: 81  85 79  Temp: 98.4 F (36.9 C)  97.9 F (36.6 C) 98.4 F (36.9 C)  TempSrc:   Oral Oral  Resp: 19  15 18   Height:      Weight:  SpO2: 99% 99% 99% 99%    Intake/Output Summary (Last 24 hours) at 07/31/15 0952 Last data filed at 07/31/15 0226  Gross per 24 hour  Intake    300 ml  Output   1100 ml  Net   -800 ml   Filed Weights   07/29/15 2312  Weight: 58.968 kg (130 lb)    Examination:  General exam: Appears calm and comfortable  Respiratory system: Clear to auscultation. Respiratory effort normal. Cardiovascular system: S1 & S2  heard, RRR. No JVD, murmurs, rubs, gallops or clicks. No pedal edema. Gastrointestinal system: Abdomen is nondistended, soft and nontender. No organomegaly or masses felt. Normal bowel sounds heard. Central nervous system: Alert and oriented. No focal neurological deficits. Extremities: Symmetric 5 x 5 power. Skin: No rashes, lesions or ulcers Psychiatry: Judgement and insight appear normal. Mood & affect appropriate.     Data Reviewed: I have personally reviewed following labs and imaging studies  CBC:  Recent Labs Lab 07/29/15 1935 07/30/15 0453 07/31/15 0630  WBC 13.3* 9.8 12.2*  NEUTROABS  --  8.3*  --   HGB 11.0* 10.4* 9.8*  HCT 33.5* 32.7* 31.7*  MCV 86.3 86.3 87.3  PLT 279 259 287   Basic Metabolic Panel:  Recent Labs Lab 07/29/15 1935 07/30/15 0453 07/31/15 0630  NA 134* 137 139  K 3.7 3.7 3.6  CL 99* 104 106  CO2 29 27 28   GLUCOSE 97 209* 152*  BUN 10 7 7   CREATININE 0.76 0.68 0.56  CALCIUM 8.9 8.6* 8.8*   GFR: Estimated Creatinine Clearance: 78.4 mL/min (by C-G formula based on Cr of 0.56). Liver Function Tests:  Recent Labs Lab 07/30/15 0048  AST 17  ALT 11*  ALKPHOS 85  BILITOT 0.5  PROT 7.3  ALBUMIN 2.9*   No results for input(s): LIPASE, AMYLASE in the last 168 hours. No results for input(s): AMMONIA in the last 168 hours. Coagulation Profile:  Recent Labs Lab 07/30/15 0048  INR 1.22   Cardiac Enzymes: No results for input(s): CKTOTAL, CKMB, CKMBINDEX, TROPONINI in the last 168 hours. BNP (last 3 results) No results for input(s): PROBNP in the last 8760 hours. HbA1C: No results for input(s): HGBA1C in the last 72 hours. CBG: No results for input(s): GLUCAP in the last 168 hours. Lipid Profile: No results for input(s): CHOL, HDL, LDLCALC, TRIG, CHOLHDL, LDLDIRECT in the last 72 hours. Thyroid Function Tests: No results for input(s): TSH, T4TOTAL, FREET4, T3FREE, THYROIDAB in the last 72 hours. Anemia Panel: No results for  input(s): VITAMINB12, FOLATE, FERRITIN, TIBC, IRON, RETICCTPCT in the last 72 hours. Sepsis Labs:  Recent Labs Lab 07/30/15 0048 07/30/15 0452  PROCALCITON 2.73  --   LATICACIDVEN 1.6 1.6    Recent Results (from the past 240 hour(s))  Blood culture (routine x 2)     Status: None (Preliminary result)   Collection Time: 07/29/15  9:35 PM  Result Value Ref Range Status   Specimen Description BLOOD RIGHT HAND  Final   Special Requests IN PEDIATRIC BOTTLE 1CC  Final   Culture  Setup Time   Final    GRAM POSITIVE COCCI IN CHAINS IN PEDIATRIC BOTTLE Organism ID to follow CRITICAL RESULT CALLED TO, READ BACK BY AND VERIFIED WITH: A. Masters Pharm.D. 17:00 07/30/15  ([email or web address])    Culture GRAM POSITIVE COCCI  Final   Report Status PENDING  Incomplete  Blood Culture ID Panel (Reflexed)     Status: Abnormal   Collection Time: 07/29/15  9:35 PM  Result Value Ref Range Status   Enterococcus species NOT DETECTED NOT DETECTED Final   Vancomycin resistance NOT DETECTED NOT DETECTED Final   Listeria monocytogenes NOT DETECTED NOT DETECTED Final   Staphylococcus species NOT DETECTED NOT DETECTED Final   Staphylococcus aureus NOT DETECTED NOT DETECTED Final   Methicillin resistance NOT DETECTED NOT DETECTED Final   Streptococcus species DETECTED (A) NOT DETECTED Final    Comment: CRITICAL RESULT CALLED TO, READ BACK BY AND VERIFIED WITH: A. Masters Pharm.D. 17:00 07/30/15 ([email or web address])    Streptococcus agalactiae NOT DETECTED NOT DETECTED Final   Streptococcus pneumoniae NOT DETECTED NOT DETECTED Final   Streptococcus pyogenes NOT DETECTED NOT DETECTED Final   Acinetobacter baumannii NOT DETECTED NOT DETECTED Final   Enterobacteriaceae species NOT DETECTED NOT DETECTED Final   Enterobacter cloacae complex NOT DETECTED NOT DETECTED Final   Escherichia coli NOT DETECTED NOT DETECTED Final   Klebsiella oxytoca NOT DETECTED NOT DETECTED Final   Klebsiella pneumoniae NOT DETECTED NOT DETECTED Final    Proteus species NOT DETECTED NOT DETECTED Final   Serratia marcescens NOT DETECTED NOT DETECTED Final   Carbapenem resistance NOT DETECTED NOT DETECTED Final   Haemophilus influenzae NOT DETECTED NOT DETECTED Final   Neisseria meningitidis NOT DETECTED NOT DETECTED Final   Pseudomonas aeruginosa NOT DETECTED NOT DETECTED Final   Candida albicans NOT DETECTED NOT DETECTED Final   Candida glabrata NOT DETECTED NOT DETECTED Final   Candida krusei NOT DETECTED NOT DETECTED Final   Candida parapsilosis NOT DETECTED NOT DETECTED Final   Candida tropicalis NOT DETECTED NOT DETECTED Final  Blood culture (routine x 2)     Status: None (Preliminary result)   Collection Time: 07/29/15 10:47 PM  Result Value Ref Range Status   Specimen Description BLOOD LEFT ANTECUBITAL  Final   Special Requests IN PEDIATRIC BOTTLE 4CC  Final   Culture  Setup Time   Final    GRAM POSITIVE COCCI IN CHAINS AEROBIC BOTTLE ONLY    Culture GRAM POSITIVE COCCI  Final   Report Status PENDING  Incomplete         Radiology Studies: Dg Chest 2 View  07/29/2015  CLINICAL DATA:  Cough. Neck pain after fall. Known rib fractures from fall last month. EXAM: CHEST  2 VIEW COMPARISON:  Left rib radiographs 06/23/2015 FINDINGS: The cardiomediastinal contours are normal. The lungs are clear. Pulmonary vasculature is normal. No consolidation, pleural effusion, or pneumothorax. The left anterior sixth rib fracture on prior rib radiographs is not well visualized on the current exam. Bilateral nipple shadows are noted. IMPRESSION: No active cardiopulmonary disease. Left anterior rib fracture on prior exam not seen currently. Electronically Signed   By: Rubye Oaks M.D.   On: 07/29/2015 17:10   Dg Cervical Spine Complete  07/29/2015  CLINICAL DATA:  Cervicalgia following fall several weeks prior EXAM: CERVICAL SPINE - COMPLETE 4+ VIEW COMPARISON:  CT neck October 18, 2013 FINDINGS: Frontal, lateral, open-mouth odontoid, and  bilateral oblique views were obtained. There is an age uncertain fracture of the anterior aspect of the C7 vertebral body which was not present on the previous study. No other fracture is evident. No spondylolisthesis. Prevertebral soft tissues and predental space regions are normal. There is moderate severe disc space narrowing at C6-7, progressed from prior study. There is moderate disc space narrowing at C5-6 and C7-T1. There are anterior osteophytes at C5, C6, and C7. There is facet hypertrophy at C5-6 and C6-7 bilaterally. IMPRESSION: Age uncertain fracture along the anterior aspect  of the C7 vertebral body with mild wedging in this area. No other fracture. Multilevel arthropathy, most severe at C6-7. No spondylolisthesis. These results will be called to the ordering clinician or representative by the Radiologist Assistant, and communication documented in the PACS or zVision Dashboard. Electronically Signed   By: Bretta BangWilliam  Woodruff III M.D.   On: 07/29/2015 17:22   Mr Cervical Spine W Wo Contrast  07/29/2015  CLINICAL DATA:  50 year old female with C7 vertebral fracture on cervical spine films performed for pain after a fall several weeks ago. Initial encounter. Personal history of IV drug abuse. EXAM: MRI CERVICAL SPINE WITHOUT AND WITH CONTRAST TECHNIQUE: Multiplanar and multiecho pulse sequences of the cervical spine, to include the craniocervical junction and cervicothoracic junction, were obtained according to standard protocol without and with intravenous contrast. CONTRAST:  10mL MULTIHANCE GADOBENATE DIMEGLUMINE 529 MG/ML IV SOLN COMPARISON:  Cervical spine radiographs 1708 hours today. Neck CT 10/18/2013. FINDINGS: Endplate irregularity and compression at both C6 and C7, more pronounced at the latter. Associated vertebral endplate marrow edema. Intervening disc space loss with some areas of increased T2 and STIR signal within the disc (series 4, image 15). Associated bulky longus coli muscle edema at  C6-C7 with prevertebral abnormal T2 and STIR hyperintensity tracking from the C3-C4 level into the upper thoracic spine. Associated abnormal epidural thickening and enhancement at C6-C7 (series 11, image 23). Superimposed abnormal dural thickening and enhancement extending posteriorly from C3-C4 to the cervicothoracic junction, ventrally from C5-C6 to T1-T2. No discrete epidural fluid collection. There is abnormal soft tissue inflammation in the bilateral cervical neural foramina, maximal at C6-C7. Posterior paraspinal muscle edema is mild to moderate compared to the prevertebral soft tissue edema. There is interspinous ligament edema at C5-C6 and C6-C7. There is associated multifactorial spinal stenosis at C6-C7 with mild spinal cord mass effect. No cord signal abnormality despite these changes. No abnormal intradural enhancement. No other cervical or visualized upper thoracic spinal stenosis or spinal cord mass effect. No other levels of disc or vertebral body infection. No other marrow edema identified. Apical lung scarring. Cervicomedullary junction is within normal limits. Grossly negative visualized brain parenchyma. Preserved major vascular flow voids in the neck at this time. IMPRESSION: 1. 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 2. No abscess or drainable fluid collection identified. 3. Associated spinal stenosis with spinal cord mass effect at C6-C7. No cord signal abnormality at this time. Electronically Signed   By: Odessa FlemingH  Hall M.D.   On: 07/29/2015 21:06        Scheduled Meds: . ceFEPime (MAXIPIME) IV  2 g Intravenous Q12H  . dexamethasone  2 mg Intravenous Q8H  . FLUoxetine  10 mg Oral Daily  . heparin  5,000 Units Subcutaneous Q8H  . nicotine  14 mg Transdermal Q24H  . QUEtiapine  100 mg Oral QHS  . vancomycin   1,250 mg Intravenous Q12H   Continuous Infusions:    LOS: 2 days    Time spent: 35 minutes.     Alba Coryegalado, Dariann Huckaba A, MD [facility] Pager [phone]  If 7PM-7AM, please contact night-coverage [email or web address] Password [email or web address] 07/31/2015, 9:52 AM

## 2015-07-31 NOTE — Progress Notes (Signed)
No issues Good strength throughout Continue empiric antibiotic treatment Follow up with me in office in 6 weeks Feel free to call with questions

## 2015-07-31 NOTE — Progress Notes (Signed)
[facility]   Reason for visit: Follow up on discitis  Interval History: now growing 2/2 with Strep, not otherwise identified. Afebrile.  Has been seen by Dr. Bevely Palmer of neurosurgery.  No surgery planned.     Physical Exam: Constitutional:  Filed Vitals:   07/30/15 2239 07/31/15 0620  BP: 121/77 112/63  Pulse: 85 79  Temp: 97.9 F (36.6 C) 98.4 F (36.9 C)  Resp: 15 18   patient appears in NAD Eyes: anicteric HENT: no thrush Respiratory: Normal respiratory effort; CTA B Cardiovascular: RRR GI: soft, nt, nd  Review of Systems: Constitutional: negative for fevers and chills Gastrointestinal: negative for diarrhea  Lab Results  Component Value Date   WBC 12.2* 07/31/2015   HGB 9.8* 07/31/2015   HCT 31.7* 07/31/2015   MCV 87.3 07/31/2015   PLT 287 07/31/2015    Lab Results  Component Value Date   CREATININE 0.56 07/31/2015   BUN 7 07/31/2015   NA 139 07/31/2015   K 3.6 07/31/2015   CL 106 07/31/2015   CO2 28 07/31/2015    Lab Results  Component Value Date   ALT 11* 07/30/2015   AST 17 07/30/2015   ALKPHOS 85 07/30/2015     Microbiology: Recent Results (from the past 240 hour(s))  Blood culture (routine x 2)     Status: None (Preliminary result)   Collection Time: 07/29/15  9:35 PM  Result Value Ref Range Status   Specimen Description BLOOD RIGHT HAND  Final   Special Requests IN PEDIATRIC BOTTLE 1CC  Final   Culture  Setup Time   Final    GRAM POSITIVE COCCI IN CHAINS IN PEDIATRIC BOTTLE Organism ID to follow CRITICAL RESULT CALLED TO, READ BACK BY AND VERIFIED WITH: A. Masters Pharm.D. 17:00 07/30/15  ([email or web address])    Culture GRAM POSITIVE COCCI  Final   Report Status PENDING  Incomplete  Blood Culture ID Panel (Reflexed)     Status: Abnormal   Collection Time: 07/29/15  9:35 PM  Result Value Ref Range Status   Enterococcus species NOT DETECTED NOT DETECTED Final   Vancomycin resistance NOT DETECTED NOT DETECTED Final   Listeria monocytogenes NOT DETECTED NOT DETECTED Final   Staphylococcus species NOT DETECTED NOT DETECTED Final   Staphylococcus aureus NOT DETECTED NOT DETECTED Final   Methicillin resistance NOT DETECTED NOT DETECTED Final   Streptococcus species DETECTED (A) NOT DETECTED Final    Comment: CRITICAL RESULT CALLED TO, READ BACK BY AND VERIFIED WITH: A. Masters Pharm.D. 17:00 07/30/15 ([email or web address])    Streptococcus agalactiae NOT DETECTED NOT DETECTED Final   Streptococcus pneumoniae NOT DETECTED NOT DETECTED Final   Streptococcus pyogenes NOT DETECTED NOT DETECTED Final   Acinetobacter baumannii NOT DETECTED NOT DETECTED Final   Enterobacteriaceae species NOT DETECTED NOT DETECTED Final   Enterobacter cloacae complex NOT DETECTED NOT DETECTED Final   Escherichia coli NOT DETECTED NOT DETECTED Final   Klebsiella oxytoca NOT DETECTED NOT DETECTED Final   Klebsiella pneumoniae NOT DETECTED NOT DETECTED Final   Proteus species NOT DETECTED NOT DETECTED Final   Serratia marcescens NOT DETECTED NOT DETECTED Final   Carbapenem resistance NOT DETECTED NOT DETECTED Final   Haemophilus influenzae NOT DETECTED NOT DETECTED Final   Neisseria meningitidis NOT DETECTED NOT DETECTED Final   Pseudomonas aeruginosa NOT DETECTED NOT DETECTED Final   Candida albicans NOT DETECTED NOT DETECTED Final   Candida glabrata NOT DETECTED NOT DETECTED Final   Candida krusei NOT DETECTED NOT DETECTED Final  Candida parapsilosis NOT DETECTED NOT DETECTED Final   Candida tropicalis NOT DETECTED NOT DETECTED Final  Blood culture (routine x 2)     Status: None (Preliminary result)   Collection Time: 07/29/15 10:47 PM  Result Value Ref Range Status   Specimen Description BLOOD LEFT ANTECUBITAL  Final   Special Requests IN PEDIATRIC BOTTLE 4CC  Final   Culture  Setup Time   Final    GRAM POSITIVE COCCI IN CHAINS AEROBIC BOTTLE ONLY    Culture GRAM POSITIVE COCCI  Final   Report Status PENDING  Incomplete     Impression/Plan:  1.  Discitis - Strep growing so will narrow down to ceftriaxone once daily and await speciation.  Will repeat blood cultures. Is negative for Enterococcus so ok to stop vancomycin as well.  Will need at least 6 weeks.  2. Substance abuse - will need prolonged IV antibiotics and won't be able to get home health so will need placement at discharge.  3. Heart murmur - with bacteremia and drug use, I agree with echo.  Report pending.

## 2015-07-31 NOTE — Progress Notes (Signed)
  Echocardiogram 2D Echocardiogram has been performed.  Tye SavoyCasey N Malynda Smolinski 07/31/2015, 10:28 AM

## 2015-08-01 LAB — CULTURE, BLOOD (ROUTINE X 2)

## 2015-08-01 LAB — CBC
HEMATOCRIT: 30.8 % — AB (ref 36.0–46.0)
Hemoglobin: 10 g/dL — ABNORMAL LOW (ref 12.0–15.0)
MCH: 28.1 pg (ref 26.0–34.0)
MCHC: 32.5 g/dL (ref 30.0–36.0)
MCV: 86.5 fL (ref 78.0–100.0)
Platelets: 315 10*3/uL (ref 150–400)
RBC: 3.56 MIL/uL — ABNORMAL LOW (ref 3.87–5.11)
RDW: 13.9 % (ref 11.5–15.5)
WBC: 15.3 10*3/uL — ABNORMAL HIGH (ref 4.0–10.5)

## 2015-08-01 LAB — C-REACTIVE PROTEIN: CRP: 2.7 mg/dL — ABNORMAL HIGH (ref <1.0)

## 2015-08-01 LAB — BASIC METABOLIC PANEL
Anion gap: 6 (ref 5–15)
BUN: 7 mg/dL (ref 6–20)
CALCIUM: 8.7 mg/dL — AB (ref 8.9–10.3)
CO2: 30 mmol/L (ref 22–32)
CREATININE: 0.67 mg/dL (ref 0.44–1.00)
Chloride: 105 mmol/L (ref 101–111)
GFR calc non Af Amer: >60 mL/min (ref >60)
GLUCOSE: 126 mg/dL — AB (ref 65–99)
Potassium: 3.4 mmol/L — ABNORMAL LOW (ref 3.5–5.1)
Sodium: 141 mmol/L (ref 135–145)

## 2015-08-01 LAB — SEDIMENTATION RATE: Sed Rate: 41 mm/hr — ABNORMAL HIGH (ref 0–22)

## 2015-08-01 MED ORDER — POTASSIUM CHLORIDE CRYS ER 20 MEQ PO TBCR
40.0000 meq | EXTENDED_RELEASE_TABLET | Freq: Once | ORAL | Status: AC
  Administered 2015-08-01: 40 meq via ORAL
  Filled 2015-08-01: qty 2

## 2015-08-01 MED ORDER — HYDROMORPHONE HCL 1 MG/ML IJ SOLN
2.0000 mg | INTRAMUSCULAR | Status: DC | PRN
  Administered 2015-08-01 – 2015-08-02 (×6): 2 mg via INTRAVENOUS
  Filled 2015-08-01 (×6): qty 2

## 2015-08-01 MED ORDER — CEFTRIAXONE SODIUM 250 MG IJ SOLR: 2.0000 g | Freq: Every day | INTRAMUSCULAR | Status: DC

## 2015-08-01 MED ORDER — NICOTINE 14 MG/24HR TD PT24: 14.0000 mg | MEDICATED_PATCH | TRANSDERMAL | Status: DC

## 2015-08-01 NOTE — Progress Notes (Signed)
CM informed by MD pt will need 6 wks of IM injections of rocephin daily  @ d/c and requested CM to setup daily IM injections with [facility]'s short stay department. CM spoke and shared request  with Melven Sartoriusenise Koger [facility] ,[phone]). Nurse Angelique Blonderenise requested we fax prescription to [phone] in order to facilitate treatment  . CM to f/u and [facility]. Gae GallopAngela Wayland Baik RN,BSN,CM [phone]

## 2015-08-01 NOTE — Progress Notes (Addendum)
Rocephin prescription faxed @ [phone] [facility], and nurse Denise([facility] nurse) confirmed prescription received. Gae GallopAngela [facility] Mckissack RN,BSN,CM

## 2015-08-01 NOTE — Progress Notes (Addendum)
PROGRESS NOTE    Alice [name]  ZOX:[id number] DOB: 1965/12/21 DOA: 07/29/2015 PCP: Bennie Pierini, FNP   Brief Narrative: Alice [name] is a 50 y.o. female with medical history significant for IV drug abuse and opiate dependency, hypertension, and depression who presents the emergency department with severe neck pain of 2 weeks' duration, worsening significantly over the past 3 days. Patient was actually seen at an outside hospital emergency department on 07/11/2015 for neck pain and temperature was elevated to 37.9 C at that time. She was suspected of having possible tickborne illness and was discharged home with doxycycline. No imaging or blood work was obtained during that visit. Patient also reports a fall down stairs approximately 4 weeks ago which resulted in a fractured left rib and believes the neck pain may have originated from there. Evaluation in the ED; radiographs of the cervical spine demonstrate an and anterior C7 body fracture with mild wedging. This was followed up with MRI of the cervical spine which is positive for discitis and osteomyelitis at C6-C7 with a pathologic fracture. There is epidural Inderal inflammation extending up to vertebral bodies from the focus of infection on MRI. Neurosurgery was consulted from the emergency department and it was advised the patient be admitted for IV antibiotics and steroids,    Assessment & Plan:   Principal Problem:   Discitis Active Problems:   TOBACCO ABUSE   Opiate dependence (HCC)   Substance induced mood disorder (HCC)   Severe recurrent major depression without psychotic features (HCC)   Heart murmur   IV drug abuse   Hyponatremia   Discitis of cervical region   1. Discitis, cervical C6 -C 7  - Seen at outside ED for neck pain and low-grade temp on 07/11/15, completed course of doxy for suspected tick-borne illness at that time  - Pain worsening, radiographs with anterior C7 fracture, MRI with cervical  discitis/osteomyelitis as described above  - Neurosurgery consulted. Recommending IV antibiotics and cervical collar.  -ID consulted.  - Decadron 2 mg q8h,.day 2/3.  -On IV ceftriaxone.  - Immobilized in cervical collar  - Pain-control prn IV dilaudid, start oral percocet with goal to titrate of IV dilaudid.  -blood culture growing strep.  -explain to patient will only be able to provide narcotics for 5 days. She  -Discussed with DR Luciana Axe, could try IM ceftriaxone, patient to go to short stay in [facility].   2. IV drug abuse, opiate-dependency  - Social work consultation requested  -will provide only 5 days of opioids for diskitis. Narcotics Data base reviewed for [place] no narcotics in the last 6 month.   3. Depression  - Pt denies SI, HI, or hallucinations, but notes acute worsening related to the current illness and pain - Continue current management with Prozac, Seroquel   4. Heart murmur  - Conspicuous murmur appreciated, not previously documented  - Concern for vegetation given pt's hx  - TTE no vegetation reported.  -will need 6 weeks IV antibiotics for diskitis.   5-HTN; hold lisinopril   DVT prophylaxis: decline heparin, SCD Code Status: Full code.  Family Communication; care discussed with patient, significant other at bedside.  Disposition Plan: To be determine.    Consultants:   Neurosurgery   ID  Procedures:   ECHO  Antimicrobials:   Cefepime 7-19  Vancomycin 7-18   Subjective: Still with significant neck pain, still asking for IV pain medications.   Objective: Filed Vitals:   07/31/15 1448 07/31/15 2147 08/01/15 0625 08/01/15 1256  BP:  117/73 121/75 110/71 123/69  Pulse: 88 82 78 82  Temp: 97.5 F (36.4 C) 98 F (36.7 C) 97.8 F (36.6 C) 98.7 F (37.1 C)  TempSrc:  Oral Oral   Resp: 19 18 18 18   Height:      Weight:      SpO2: 99% 98% 100% 100%    Intake/Output Summary (Last 24 hours) at 08/01/15 1339 Last data filed at  08/01/15 0936  Gross per 24 hour  Intake    890 ml  Output      0 ml  Net    890 ml   Filed Weights   07/29/15 2312  Weight: 58.968 kg (130 lb)    Examination:  General exam: Appears calm and comfortable  Respiratory system: Clear to auscultation. Respiratory effort normal. Cardiovascular system: S1 & S2 heard, RRR. No JVD, murmurs, rubs, gallops or clicks. No pedal edema. Gastrointestinal system: Abdomen is nondistended, soft and nontender. No organomegaly or masses felt. Normal bowel sounds heard. Central nervous system: Alert and oriented. No focal neurological deficits. Extremities: Symmetric 5 x 5 power. Skin: No rashes, lesions or ulcers Psychiatry: Judgement and insight appear normal. Mood & affect appropriate.     Data Reviewed: I have personally reviewed following labs and imaging studies  CBC:  Recent Labs Lab 07/29/15 1935 07/30/15 0453 07/31/15 0630 08/01/15 0601  WBC 13.3* 9.8 12.2* 15.3*  NEUTROABS  --  8.3*  --   --   HGB 11.0* 10.4* 9.8* 10.0*  HCT 33.5* 32.7* 31.7* 30.8*  MCV 86.3 86.3 87.3 86.5  PLT 279 259 287 315   Basic Metabolic Panel:  Recent Labs Lab 07/29/15 1935 07/30/15 0453 07/31/15 0630 08/01/15 0601  NA 134* 137 139 141  K 3.7 3.7 3.6 3.4*  CL 99* 104 106 105  CO2 29 27 28 30   GLUCOSE 97 209* 152* 126*  BUN 10 7 7 7   CREATININE 0.76 0.68 0.56 0.67  CALCIUM 8.9 8.6* 8.8* 8.7*   GFR: Estimated Creatinine Clearance: 78.4 mL/min (by C-G formula based on Cr of 0.67). Liver Function Tests:  Recent Labs Lab 07/30/15 0048  AST 17  ALT 11*  ALKPHOS 85  BILITOT 0.5  PROT 7.3  ALBUMIN 2.9*   No results for input(s): LIPASE, AMYLASE in the last 168 hours. No results for input(s): AMMONIA in the last 168 hours. Coagulation Profile:  Recent Labs Lab 07/30/15 0048  INR 1.22   Cardiac Enzymes: No results for input(s): CKTOTAL, CKMB, CKMBINDEX, TROPONINI in the last 168 hours. BNP (last 3 results) No results for  input(s): PROBNP in the last 8760 hours. HbA1C: No results for input(s): HGBA1C in the last 72 hours. CBG: No results for input(s): GLUCAP in the last 168 hours. Lipid Profile: No results for input(s): CHOL, HDL, LDLCALC, TRIG, CHOLHDL, LDLDIRECT in the last 72 hours. Thyroid Function Tests: No results for input(s): TSH, T4TOTAL, FREET4, T3FREE, THYROIDAB in the last 72 hours. Anemia Panel: No results for input(s): VITAMINB12, FOLATE, FERRITIN, TIBC, IRON, RETICCTPCT in the last 72 hours. Sepsis Labs:  Recent Labs Lab 07/30/15 0048 07/30/15 0452  PROCALCITON 2.73  --   LATICACIDVEN 1.6 1.6    Recent Results (from the past 240 hour(s))  Blood culture (routine x 2)     Status: Abnormal   Collection Time: 07/29/15  9:35 PM  Result Value Ref Range Status   Specimen Description BLOOD RIGHT HAND  Final   Special Requests IN PEDIATRIC BOTTLE 1CC  Final   Culture  Setup Time   Final    GRAM POSITIVE COCCI IN CHAINS IN PEDIATRIC BOTTLE CRITICAL RESULT CALLED TO, READ BACK BY AND VERIFIED WITH: A. Masters Pharm.D. 17:00 07/30/15  ([email or web address])    Culture VIRIDANS STREPTOCOCCUS (A)  Final   Report Status 08/01/2015 FINAL  Final   Organism ID, Bacteria VIRIDANS STREPTOCOCCUS  Final      Susceptibility   Viridans streptococcus - MIC*    ERYTHROMYCIN <=0.12 SENSITIVE Sensitive     LEVOFLOXACIN 1 SENSITIVE Sensitive     VANCOMYCIN 0.5 SENSITIVE Sensitive     * VIRIDANS STREPTOCOCCUS  Blood Culture ID Panel (Reflexed)     Status: Abnormal   Collection Time: 07/29/15  9:35 PM  Result Value Ref Range Status   Enterococcus species NOT DETECTED NOT DETECTED Final   Vancomycin resistance NOT DETECTED NOT DETECTED Final   Listeria monocytogenes NOT DETECTED NOT DETECTED Final   Staphylococcus species NOT DETECTED NOT DETECTED Final   Staphylococcus aureus NOT DETECTED NOT DETECTED Final   Methicillin resistance NOT DETECTED NOT DETECTED Final   Streptococcus species DETECTED (A) NOT DETECTED  Final    Comment: CRITICAL RESULT CALLED TO, READ BACK BY AND VERIFIED WITH: A. Masters Pharm.D. 17:00 07/30/15 ([email or web address])    Streptococcus agalactiae NOT DETECTED NOT DETECTED Final   Streptococcus pneumoniae NOT DETECTED NOT DETECTED Final   Streptococcus pyogenes NOT DETECTED NOT DETECTED Final   Acinetobacter baumannii NOT DETECTED NOT DETECTED Final   Enterobacteriaceae species NOT DETECTED NOT DETECTED Final   Enterobacter cloacae complex NOT DETECTED NOT DETECTED Final   Escherichia coli NOT DETECTED NOT DETECTED Final   Klebsiella oxytoca NOT DETECTED NOT DETECTED Final   Klebsiella pneumoniae NOT DETECTED NOT DETECTED Final   Proteus species NOT DETECTED NOT DETECTED Final   Serratia marcescens NOT DETECTED NOT DETECTED Final   Carbapenem resistance NOT DETECTED NOT DETECTED Final   Haemophilus influenzae NOT DETECTED NOT DETECTED Final   Neisseria meningitidis NOT DETECTED NOT DETECTED Final   Pseudomonas aeruginosa NOT DETECTED NOT DETECTED Final   Candida albicans NOT DETECTED NOT DETECTED Final   Candida glabrata NOT DETECTED NOT DETECTED Final   Candida krusei NOT DETECTED NOT DETECTED Final   Candida parapsilosis NOT DETECTED NOT DETECTED Final   Candida tropicalis NOT DETECTED NOT DETECTED Final  Blood culture (routine x 2)     Status: Abnormal   Collection Time: 07/29/15 10:47 PM  Result Value Ref Range Status   Specimen Description BLOOD LEFT ANTECUBITAL  Final   Special Requests IN PEDIATRIC BOTTLE 4CC  Final   Culture  Setup Time   Final    GRAM POSITIVE COCCI IN CHAINS AEROBIC BOTTLE ONLY    Culture (A)  Final    VIRIDANS STREPTOCOCCUS SUSCEPTIBILITIES PERFORMED ON PREVIOUS CULTURE WITHIN THE LAST 5 DAYS.    Report Status 08/01/2015 FINAL  Final         Radiology Studies: No results found.      Scheduled Meds: . cefTRIAXone (ROCEPHIN)  IV  2 g Intravenous Q24H  . dexamethasone  2 mg Intravenous Q8H  . FLUoxetine  10 mg Oral Daily  . heparin   5,000 Units Subcutaneous Q8H  . nicotine  14 mg Transdermal Q24H  . polyethylene glycol  17 g Oral BID  . QUEtiapine  100 mg Oral QHS   Continuous Infusions:    LOS: 3 days    Time spent: 35 minutes.     Alba Cory, MD [facility] Pager [phone]  If 7PM-7AM,  please contact night-coverage [email or web address] Password [email or web address] 08/01/2015, 1:39 PM

## 2015-08-01 NOTE — Progress Notes (Signed)
Pt walking in hall with cervical collar off, pt stated she took off cause she had a bm and had to get cleaned up. Instructed pt to place collar back on.

## 2015-08-01 NOTE — Progress Notes (Signed)
[facility]   Reason for visit: Follow up on discitis  Interval History: now growing 2/2 with Strep viridans in blood culture, pan sensitive.     Physical Exam: Constitutional:  Filed Vitals:   07/31/15 2147 08/01/15 0625  BP: 121/75 110/71  Pulse: 82 78  Temp: 98 F (36.7 C) 97.8 F (36.6 C)  Resp: 18 18   patient appears in NAD Eyes: anicteric HENT: neck brace in place Respiratory: Normal respiratory effort; CTA B Cardiovascular: RRR GI: soft, nt, nd  Review of Systems: Constitutional: negative for fevers and chills Gastrointestinal: negative for diarrhea  Lab Results  Component Value Date   WBC 15.3* 08/01/2015   HGB 10.0* 08/01/2015   HCT 30.8* 08/01/2015   MCV 86.5 08/01/2015   PLT 315 08/01/2015    Lab Results  Component Value Date   CREATININE 0.67 08/01/2015   BUN 7 08/01/2015   NA 141 08/01/2015   K 3.4* 08/01/2015   CL 105 08/01/2015   CO2 30 08/01/2015    Lab Results  Component Value Date   ALT 11* 07/30/2015   AST 17 07/30/2015   ALKPHOS 85 07/30/2015     Microbiology: Recent Results (from the past 240 hour(s))  Blood culture (routine x 2)     Status: Abnormal   Collection Time: 07/29/15  9:35 PM  Result Value Ref Range Status   Specimen Description BLOOD RIGHT HAND  Final   Special Requests IN PEDIATRIC BOTTLE 1CC  Final   Culture  Setup Time   Final    GRAM POSITIVE COCCI IN CHAINS IN PEDIATRIC BOTTLE CRITICAL RESULT CALLED TO, READ BACK BY AND VERIFIED WITH: A. Masters Pharm.D. 17:00 07/30/15  ([email or web address])    Culture VIRIDANS STREPTOCOCCUS (A)  Final   Report Status 08/01/2015 FINAL  Final   Organism ID, Bacteria VIRIDANS STREPTOCOCCUS  Final      Susceptibility   Viridans streptococcus - MIC*    ERYTHROMYCIN <=0.12 SENSITIVE Sensitive     LEVOFLOXACIN 1 SENSITIVE Sensitive     VANCOMYCIN 0.5 SENSITIVE Sensitive     * VIRIDANS STREPTOCOCCUS  Blood Culture ID Panel (Reflexed)     Status: Abnormal   Collection Time: 07/29/15  9:35 PM  Result Value Ref Range Status   Enterococcus species NOT DETECTED NOT DETECTED Final   Vancomycin resistance NOT DETECTED NOT DETECTED Final   Listeria monocytogenes NOT DETECTED NOT DETECTED Final   Staphylococcus species NOT DETECTED NOT DETECTED Final   Staphylococcus aureus NOT DETECTED NOT DETECTED Final   Methicillin resistance NOT DETECTED NOT DETECTED Final   Streptococcus species DETECTED (A) NOT DETECTED Final    Comment: CRITICAL RESULT CALLED TO, READ BACK BY AND VERIFIED WITH: A. Masters Pharm.D. 17:00 07/30/15 ([email or web address])    Streptococcus agalactiae NOT DETECTED NOT DETECTED Final   Streptococcus pneumoniae NOT DETECTED NOT DETECTED Final   Streptococcus pyogenes NOT DETECTED NOT DETECTED Final   Acinetobacter baumannii NOT DETECTED NOT DETECTED Final   Enterobacteriaceae species NOT DETECTED NOT DETECTED Final   Enterobacter cloacae complex NOT DETECTED NOT DETECTED Final   Escherichia coli NOT DETECTED NOT DETECTED Final   Klebsiella oxytoca NOT DETECTED NOT DETECTED Final   Klebsiella pneumoniae NOT DETECTED NOT DETECTED Final   Proteus species NOT DETECTED NOT DETECTED Final   Serratia marcescens NOT DETECTED NOT DETECTED Final   Carbapenem resistance NOT DETECTED NOT DETECTED Final   Haemophilus influenzae NOT DETECTED NOT DETECTED Final   Neisseria meningitidis NOT DETECTED NOT DETECTED  Final   Pseudomonas aeruginosa NOT DETECTED NOT DETECTED Final   Candida albicans NOT DETECTED NOT DETECTED Final   Candida glabrata NOT DETECTED NOT DETECTED Final   Candida krusei NOT DETECTED NOT DETECTED Final   Candida parapsilosis NOT DETECTED NOT DETECTED Final   Candida tropicalis NOT DETECTED NOT DETECTED Final  Blood culture (routine x 2)     Status: Abnormal   Collection Time: 07/29/15 10:47 PM  Result Value Ref Range Status   Specimen Description BLOOD LEFT ANTECUBITAL  Final   Special Requests IN PEDIATRIC BOTTLE 4CC  Final    Culture  Setup Time   Final    GRAM POSITIVE COCCI IN CHAINS AEROBIC BOTTLE ONLY    Culture (A)  Final    VIRIDANS STREPTOCOCCUS SUSCEPTIBILITIES PERFORMED ON PREVIOUS CULTURE WITHIN THE LAST 5 DAYS.    Report Status 08/01/2015 FINAL  Final    Impression/Plan:  1.  Discitis - Strep viridans in 2/2 blood cultures.  Patient refuses placement.  Will not send home with picc line. A reasonable alternative is daily IM injection of 2 grams of ceftriaxone for 6 weeks at [facility] or [facility].  She is willing to travel there daily for 6 weeks.  Could do oral levaquin 750 mg after that if still needed.   Will need weekly cbc, bmp to [facility] We will arrange follow up in [facility] in about 4 weeks HIV negative, hep C pending.   2. Substance abuse - needs continued efforts at remaining clean  3. Heart murmur - echo without vegetation. Needs prolonged antibiotics so will not do a TEE.

## 2015-08-02 LAB — HEPATITIS C ANTIBODY: HCV Ab: >11 s/co ratio — ABNORMAL HIGH (ref 0.0–0.9)

## 2015-08-02 MED ORDER — OXYCODONE-ACETAMINOPHEN 5-325 MG PO TABS: 1.0000 | ORAL_TABLET | ORAL | Status: DC | PRN

## 2015-08-02 MED ORDER — DEXTROSE 5 % IV SOLN
2.0000 g | INTRAVENOUS | Status: DC
Start: 2015-08-02 — End: 2015-08-02
  Administered 2015-08-02: 2 g via INTRAVENOUS
  Filled 2015-08-02: qty 2

## 2015-08-02 NOTE — Discharge Summary (Signed)
Physician Discharge Summary  Alice [name] ZOX:[id number] DOB: 1965-01-17 DOA: 07/29/2015  PCP: Alice Pierini, FNP  Admit date: 07/29/2015 Discharge date: 08/02/2015  Admitted From: Home  Disposition:  Home   Recommendations for Outpatient Follow-up:  1. Follow up with PCP in 1-2 weeks 2. Please obtain BMP/CBC in one week 3. Follow with neurosurgery and ID for further care of diskitis.    Discharge Condition: stable.  CODE STATUS: full code.  Diet recommendation: Heart Healthy  Brief/Interim Summary: Brief Narrative: Alice [name] is a 51 y.o. female with medical history significant for IV drug abuse and opiate dependency, hypertension, and depression who presents the emergency department with severe neck pain of 2 weeks' duration, worsening significantly over the past 3 days. Patient was actually seen at an outside hospital emergency department on 07/11/2015 for neck pain and temperature was elevated to 37.9 C at that time. She was suspected of having possible tickborne illness and was discharged home with doxycycline. No imaging or blood work was obtained during that visit. Patient also reports a fall down stairs approximately 4 weeks ago which resulted in a fractured left rib and believes the neck pain may have originated from there. Evaluation in the ED; radiographs of the cervical spine demonstrate an and anterior C7 body fracture with mild wedging. This was followed up with MRI of the cervical spine which is positive for discitis and osteomyelitis at C6-C7 with a pathologic fracture. There is epidural Inderal inflammation extending up to vertebral bodies from the focus of infection on MRI. Neurosurgery was consulted from the emergency department and it was advised the patient be admitted for IV antibiotics and steroids,    Assessment & Plan:  Principal Problem:  Discitis Active Problems:  TOBACCO ABUSE  Opiate dependence (HCC)  Substance induced mood disorder (HCC)   Severe recurrent major depression without psychotic features (HCC)  Heart murmur  IV drug abuse  Hyponatremia  Discitis of cervical region   1. Discitis, cervical C6 -C 7  - Seen at outside ED for neck pain and low-grade temp on 07/11/15, completed course of doxy for suspected tick-borne illness at that time  - Pain worsening, radiographs with anterior C7 fracture, MRI with cervical discitis/osteomyelitis as described above  - Neurosurgery consulted. Recommending IV antibiotics and cervical collar.  -ID consulted.  - Decadron 2 mg q8h,.day 3/3. received 3 days  -On IV ceftriaxone.  - Immobilized in cervical collar  -blood culture growing strep.  -Discussed with Alice [name], could try IM ceftriaxone, patient to go to short stay in [place].  Patient decline SNF for IV antibiotics. Due to her history of IV drug use can not do IV antibiotics at home.  Plan is to do IM ceftriaxone outpatient [facility]. CM help arrange  2. IV drug abuse, opiate-dependency  - Social work consultation requested  -will provide only 5 days of opioids for diskitis. Narcotics Data base reviewed for [facility] no narcotics in the last 6 month. I explain to patient, opioid for short term. Pain should get better  # 30 of percocet.   3. Depression  - Pt denies SI, HI, or hallucinations, but notes acute worsening related to the current illness and pain - Continue current management with Prozac, Seroquel   4. Heart murmur  - Conspicuous murmur appreciated, not previously documented  - Concern for vegetation given pt's hx  - TTE no vegetation reported.  -will need 6 weeks IV antibiotics for diskitis.   5-HTN; hold lisinopril    Discharge Diagnoses:  Principal  Problem:   Discitis Active Problems:   TOBACCO ABUSE   Opiate dependence (HCC)   Substance induced mood disorder (HCC)   Severe recurrent major depression without psychotic features (HCC)   Heart murmur   IV drug abuse   Hyponatremia    Discitis of cervical region    Discharge Instructions  Discharge Instructions    Diet - low sodium heart healthy    Complete by:  As directed      Increase activity slowly    Complete by:  As directed             Medication List    STOP taking these medications        cyclobenzaprine 10 MG tablet  Commonly known as:  FLEXERIL     FLUoxetine 10 MG capsule  Commonly known as:  PROZAC     hydrOXYzine 50 MG tablet  Commonly known as:  ATARAX/VISTARIL     lisinopril 5 MG tablet  Commonly known as:  PRINIVIL,ZESTRIL     methocarbamol 500 MG tablet  Commonly known as:  ROBAXIN      TAKE these medications        cefTRIAXone 250 MG injection  Commonly known as:  ROCEPHIN  Inject 2,000 mg (2 g total) into the muscle daily. FOR IM use in LARGE MUSCLE MASS     ibuprofen 200 MG tablet  Commonly known as:  ADVIL,MOTRIN  Take 400 mg by mouth every 6 (six) hours as needed.     nicotine 14 mg/24hr patch  Commonly known as:  NICODERM CQ - dosed in mg/24 hours  Place 1 patch (14 mg total) onto the skin daily.     oxyCODONE-acetaminophen 5-325 MG tablet  Commonly known as:  PERCOCET/ROXICET  Take 1-2 tablets by mouth every 4 (four) hours as needed for moderate pain.     QUEtiapine 100 MG tablet  Commonly known as:  SEROQUEL  Take 1 tablet (100 mg total) by mouth at bedtime. For mood control     traZODone 100 MG tablet  Commonly known as:  DESYREL  Take 1 tablet (100 mg total) by mouth at bedtime and may repeat dose one time if needed. For sleep           Follow-up Information    Please follow up.   Why:  [facility] short stay for antibiotics.       Follow up with Alice Righter, MD In 4 weeks.   Specialty:  Infectious Diseases   Contact information:   [address] [place] [address] [phone]       Follow up with Alice Halt Ditty, MD In 4 weeks.   Specialty:  Neurosurgery   Contact information:   [address] [place]  [address] [phone]      Allergies  Allergen Reactions  . Codeine Rash  . Tetracycline Other (See Comments)    Consultations:  ID  Neurosurgery    Procedures/Studies: Dg Chest 2 View  07/29/2015  CLINICAL DATA:  Cough. Neck pain after fall. Known rib fractures from fall last month. EXAM: CHEST  2 VIEW COMPARISON:  Left rib radiographs 06/23/2015 FINDINGS: The cardiomediastinal contours are normal. The lungs are clear. Pulmonary vasculature is normal. No consolidation, pleural effusion, or pneumothorax. The left anterior sixth rib fracture on prior rib radiographs is not well visualized on the current exam. Bilateral nipple shadows are noted. IMPRESSION: No active cardiopulmonary disease. Left anterior rib fracture on prior exam not  seen currently. Electronically Signed   By: Rubye Oaks M.D.   On: 07/29/2015 17:10   Dg Cervical Spine Complete  07/29/2015  CLINICAL DATA:  Cervicalgia following fall several weeks prior EXAM: CERVICAL SPINE - COMPLETE 4+ VIEW COMPARISON:  CT neck October 18, 2013 FINDINGS: Frontal, lateral, open-mouth odontoid, and bilateral oblique views were obtained. There is an age uncertain fracture of the anterior aspect of the C7 vertebral body which was not present on the previous study. No other fracture is evident. No spondylolisthesis. Prevertebral soft tissues and predental space regions are normal. There is moderate severe disc space narrowing at C6-7, progressed from prior study. There is moderate disc space narrowing at C5-6 and C7-T1. There are anterior osteophytes at C5, C6, and C7. There is facet hypertrophy at C5-6 and C6-7 bilaterally. IMPRESSION: Age uncertain fracture along the anterior aspect of the C7 vertebral body with mild wedging in this area. No other fracture. Multilevel arthropathy, most severe at C6-7. No spondylolisthesis. These results will be called to the ordering clinician or representative by the Radiologist Assistant, and communication  documented in the PACS or zVision Dashboard. Electronically Signed   By: Bretta Bang III M.D.   On: 07/29/2015 17:22   Mr Cervical Spine W Wo Contrast  07/29/2015  CLINICAL DATA:  50 year old female with C7 vertebral fracture on cervical spine films performed for pain after a fall several weeks ago. Initial encounter. Personal history of IV drug abuse. EXAM: MRI CERVICAL SPINE WITHOUT AND WITH CONTRAST TECHNIQUE: Multiplanar and multiecho pulse sequences of the cervical spine, to include the craniocervical junction and cervicothoracic junction, were obtained according to standard protocol without and with intravenous contrast. CONTRAST:  10mL MULTIHANCE GADOBENATE DIMEGLUMINE 529 MG/ML IV SOLN COMPARISON:  Cervical spine radiographs 1708 hours today. Neck CT 10/18/2013. FINDINGS: Endplate irregularity and compression at both C6 and C7, more pronounced at the latter. Associated vertebral endplate marrow edema. Intervening disc space loss with some areas of increased T2 and STIR signal within the disc (series 4, image 15). Associated bulky longus coli muscle edema at C6-C7 with prevertebral abnormal T2 and STIR hyperintensity tracking from the C3-C4 level into the upper thoracic spine. Associated abnormal epidural thickening and enhancement at C6-C7 (series 11, image 23). Superimposed abnormal dural thickening and enhancement extending posteriorly from C3-C4 to the cervicothoracic junction, ventrally from C5-C6 to T1-T2. No discrete epidural fluid collection. There is abnormal soft tissue inflammation in the bilateral cervical neural foramina, maximal at C6-C7. Posterior paraspinal muscle edema is mild to moderate compared to the prevertebral soft tissue edema. There is interspinous ligament edema at C5-C6 and C6-C7. There is associated multifactorial spinal stenosis at C6-C7 with mild spinal cord mass effect. No cord signal abnormality despite these changes. No abnormal intradural enhancement. No other  cervical or visualized upper thoracic spinal stenosis or spinal cord mass effect. No other levels of disc or vertebral body infection. No other marrow edema identified. Apical lung scarring. Cervicomedullary junction is within normal limits. Grossly negative visualized brain parenchyma. Preserved major vascular flow voids in the neck at this time. IMPRESSION: 1. 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 2. No abscess or drainable fluid collection identified. 3. Associated spinal stenosis with spinal cord mass effect at C6-C7. No cord signal abnormality at this time. Electronically Signed   By: Odessa Fleming M.D.   On: 07/29/2015 21:06  Subjective: Feeling ok, still with neck pain   Discharge Exam: Filed Vitals:   08/01/15 2227 08/02/15 0628  BP: 147/86 137/89  Pulse: 81 75  Temp: 97.7 F (36.5 C) 98 F (36.7 C)  Resp: 20 18   Filed Vitals:   08/01/15 0625 08/01/15 1256 08/01/15 2227 08/02/15 0628  BP: 110/71 123/69 147/86 137/89  Pulse: 78 82 81 75  Temp: 97.8 F (36.6 C) 98.7 F (37.1 C) 97.7 F (36.5 C) 98 F (36.7 C)  TempSrc: Oral  Oral Oral  Resp: 18 18 20 18   Height:      Weight:      SpO2: 100% 100% 98% 99%    General: Pt is alert, awake, not in acute distress Cardiovascular: RRR, S1/S2 +, no rubs, no gallops Respiratory: CTA bilaterally, no wheezing, no rhonchi Abdominal: Soft, NT, ND, bowel sounds + Extremities: no edema, no cyanosis    The results of significant diagnostics from this hospitalization (including imaging, microbiology, ancillary and laboratory) are listed below for reference.     Microbiology: Recent Results (from the past 240 hour(s))  Blood culture (routine x 2)     Status: Abnormal   Collection Time: 07/29/15  9:35 PM  Result Value Ref Range Status   Specimen  Description BLOOD RIGHT HAND  Final   Special Requests IN PEDIATRIC BOTTLE 1CC  Final   Culture  Setup Time   Final    GRAM POSITIVE COCCI IN CHAINS IN PEDIATRIC BOTTLE CRITICAL RESULT CALLED TO, READ BACK BY AND VERIFIED WITH: A. Masters Pharm.D. 17:00 07/30/15  ([email or web address])    Culture VIRIDANS STREPTOCOCCUS (A)  Final   Report Status 08/01/2015 FINAL  Final   Organism ID, Bacteria VIRIDANS STREPTOCOCCUS  Final      Susceptibility   Viridans streptococcus - MIC*    ERYTHROMYCIN <=0.12 SENSITIVE Sensitive     LEVOFLOXACIN 1 SENSITIVE Sensitive     VANCOMYCIN 0.5 SENSITIVE Sensitive     * VIRIDANS STREPTOCOCCUS  Blood Culture ID Panel (Reflexed)     Status: Abnormal   Collection Time: 07/29/15  9:35 PM  Result Value Ref Range Status   Enterococcus species NOT DETECTED NOT DETECTED Final   Vancomycin resistance NOT DETECTED NOT DETECTED Final   Listeria monocytogenes NOT DETECTED NOT DETECTED Final   Staphylococcus species NOT DETECTED NOT DETECTED Final   Staphylococcus aureus NOT DETECTED NOT DETECTED Final   Methicillin resistance NOT DETECTED NOT DETECTED Final   Streptococcus species DETECTED (A) NOT DETECTED Final    Comment: CRITICAL RESULT CALLED TO, READ BACK BY AND VERIFIED WITH: A. Masters Pharm.D. 17:00 07/30/15 ([email or web address])    Streptococcus agalactiae NOT DETECTED NOT DETECTED Final   Streptococcus pneumoniae NOT DETECTED NOT DETECTED Final   Streptococcus pyogenes NOT DETECTED NOT DETECTED Final   Acinetobacter baumannii NOT DETECTED NOT DETECTED Final   Enterobacteriaceae species NOT DETECTED NOT DETECTED Final   Enterobacter cloacae complex NOT DETECTED NOT DETECTED Final   Escherichia coli NOT DETECTED NOT DETECTED Final   Klebsiella oxytoca NOT DETECTED NOT DETECTED Final   Klebsiella pneumoniae NOT DETECTED NOT DETECTED Final   Proteus species NOT DETECTED NOT DETECTED Final   Serratia marcescens NOT DETECTED NOT DETECTED Final   Carbapenem resistance NOT DETECTED NOT  DETECTED Final   Haemophilus influenzae NOT DETECTED NOT DETECTED Final   Neisseria meningitidis NOT DETECTED NOT DETECTED Final   Pseudomonas aeruginosa NOT DETECTED NOT DETECTED Final   Candida albicans NOT DETECTED NOT DETECTED Final   Candida glabrata NOT  DETECTED NOT DETECTED Final   Candida krusei NOT DETECTED NOT DETECTED Final   Candida parapsilosis NOT DETECTED NOT DETECTED Final   Candida tropicalis NOT DETECTED NOT DETECTED Final  Blood culture (routine x 2)     Status: Abnormal   Collection Time: 07/29/15 10:47 PM  Result Value Ref Range Status   Specimen Description BLOOD LEFT ANTECUBITAL  Final   Special Requests IN PEDIATRIC BOTTLE 4CC  Final   Culture  Setup Time   Final    GRAM POSITIVE COCCI IN CHAINS AEROBIC BOTTLE ONLY    Culture (A)  Final    VIRIDANS STREPTOCOCCUS SUSCEPTIBILITIES PERFORMED ON PREVIOUS CULTURE WITHIN THE LAST 5 DAYS.    Report Status 08/01/2015 FINAL  Final     Labs: BNP (last 3 results) No results for input(s): BNP in the last 8760 hours. Basic Metabolic Panel:  Recent Labs Lab 07/29/15 1935 07/30/15 0453 07/31/15 0630 08/01/15 0601  NA 134* 137 139 141  K 3.7 3.7 3.6 3.4*  CL 99* 104 106 105  CO2 29 27 28 30   GLUCOSE 97 209* 152* 126*  BUN 10 7 7 7   CREATININE 0.76 0.68 0.56 0.67  CALCIUM 8.9 8.6* 8.8* 8.7*   Liver Function Tests:  Recent Labs Lab 07/30/15 0048  AST 17  ALT 11*  ALKPHOS 85  BILITOT 0.5  PROT 7.3  ALBUMIN 2.9*   No results for input(s): LIPASE, AMYLASE in the last 168 hours. No results for input(s): AMMONIA in the last 168 hours. CBC:  Recent Labs Lab 07/29/15 1935 07/30/15 0453 07/31/15 0630 08/01/15 0601  WBC 13.3* 9.8 12.2* 15.3*  NEUTROABS  --  8.3*  --   --   HGB 11.0* 10.4* 9.8* 10.0*  HCT 33.5* 32.7* 31.7* 30.8*  MCV 86.3 86.3 87.3 86.5  PLT 279 259 287 315   Cardiac Enzymes: No results for input(s): CKTOTAL, CKMB, CKMBINDEX, TROPONINI in the last 168 hours. BNP: Invalid  input(s): POCBNP CBG: No results for input(s): GLUCAP in the last 168 hours. D-Dimer No results for input(s): DDIMER in the last 72 hours. Hgb A1c No results for input(s): HGBA1C in the last 72 hours. Lipid Profile No results for input(s): CHOL, HDL, LDLCALC, TRIG, CHOLHDL, LDLDIRECT in the last 72 hours. Thyroid function studies No results for input(s): TSH, T4TOTAL, T3FREE, THYROIDAB in the last 72 hours.  Invalid input(s): FREET3 Anemia work up No results for input(s): VITAMINB12, FOLATE, FERRITIN, TIBC, IRON, RETICCTPCT in the last 72 hours. Urinalysis    Component Value Date/Time   COLORURINE YELLOW 07/30/2015 0314   APPEARANCEUR CLEAR 07/30/2015 0314   LABSPEC 1.011 07/30/2015 0314   PHURINE 6.0 07/30/2015 0314   GLUCOSEU 100* 07/30/2015 0314   HGBUR NEGATIVE 07/30/2015 0314   BILIRUBINUR NEGATIVE 07/30/2015 0314   BILIRUBINUR neg 10/30/2013 1427   KETONESUR NEGATIVE 07/30/2015 0314   PROTEINUR NEGATIVE 07/30/2015 0314   PROTEINUR 4+ 10/30/2013 1427   UROBILINOGEN negative 10/30/2013 1427   UROBILINOGEN 1.0 07/31/2011 1059   NITRITE NEGATIVE 07/30/2015 0314   NITRITE pos+ 10/30/2013 1427   LEUKOCYTESUR SMALL* 07/30/2015 0314   Sepsis Labs Invalid input(s): PROCALCITONIN,  WBC,  LACTICIDVEN Microbiology Recent Results (from the past 240 hour(s))  Blood culture (routine x 2)     Status: Abnormal   Collection Time: 07/29/15  9:35 PM  Result Value Ref Range Status   Specimen Description BLOOD RIGHT HAND  Final   Special Requests IN PEDIATRIC BOTTLE 1CC  Final   Culture  Setup Time  Final    GRAM POSITIVE COCCI IN CHAINS IN PEDIATRIC BOTTLE CRITICAL RESULT CALLED TO, READ BACK BY AND VERIFIED WITH: A. Masters Pharm.D. 17:00 07/30/15  ([email or web address])    Culture VIRIDANS STREPTOCOCCUS (A)  Final   Report Status 08/01/2015 FINAL  Final   Organism ID, Bacteria VIRIDANS STREPTOCOCCUS  Final      Susceptibility   Viridans streptococcus - MIC*    ERYTHROMYCIN <=0.12  SENSITIVE Sensitive     LEVOFLOXACIN 1 SENSITIVE Sensitive     VANCOMYCIN 0.5 SENSITIVE Sensitive     * VIRIDANS STREPTOCOCCUS  Blood Culture ID Panel (Reflexed)     Status: Abnormal   Collection Time: 07/29/15  9:35 PM  Result Value Ref Range Status   Enterococcus species NOT DETECTED NOT DETECTED Final   Vancomycin resistance NOT DETECTED NOT DETECTED Final   Listeria monocytogenes NOT DETECTED NOT DETECTED Final   Staphylococcus species NOT DETECTED NOT DETECTED Final   Staphylococcus aureus NOT DETECTED NOT DETECTED Final   Methicillin resistance NOT DETECTED NOT DETECTED Final   Streptococcus species DETECTED (A) NOT DETECTED Final    Comment: CRITICAL RESULT CALLED TO, READ BACK BY AND VERIFIED WITH: A. Masters Pharm.D. 17:00 07/30/15 ([email or web address])    Streptococcus agalactiae NOT DETECTED NOT DETECTED Final   Streptococcus pneumoniae NOT DETECTED NOT DETECTED Final   Streptococcus pyogenes NOT DETECTED NOT DETECTED Final   Acinetobacter baumannii NOT DETECTED NOT DETECTED Final   Enterobacteriaceae species NOT DETECTED NOT DETECTED Final   Enterobacter cloacae complex NOT DETECTED NOT DETECTED Final   Escherichia coli NOT DETECTED NOT DETECTED Final   Klebsiella oxytoca NOT DETECTED NOT DETECTED Final   Klebsiella pneumoniae NOT DETECTED NOT DETECTED Final   Proteus species NOT DETECTED NOT DETECTED Final   Serratia marcescens NOT DETECTED NOT DETECTED Final   Carbapenem resistance NOT DETECTED NOT DETECTED Final   Haemophilus influenzae NOT DETECTED NOT DETECTED Final   Neisseria meningitidis NOT DETECTED NOT DETECTED Final   Pseudomonas aeruginosa NOT DETECTED NOT DETECTED Final   Candida albicans NOT DETECTED NOT DETECTED Final   Candida glabrata NOT DETECTED NOT DETECTED Final   Candida krusei NOT DETECTED NOT DETECTED Final   Candida parapsilosis NOT DETECTED NOT DETECTED Final   Candida tropicalis NOT DETECTED NOT DETECTED Final  Blood culture (routine x 2)     Status:  Abnormal   Collection Time: 07/29/15 10:47 PM  Result Value Ref Range Status   Specimen Description BLOOD LEFT ANTECUBITAL  Final   Special Requests IN PEDIATRIC BOTTLE 4CC  Final   Culture  Setup Time   Final    GRAM POSITIVE COCCI IN CHAINS AEROBIC BOTTLE ONLY    Culture (A)  Final    VIRIDANS STREPTOCOCCUS SUSCEPTIBILITIES PERFORMED ON PREVIOUS CULTURE WITHIN THE LAST 5 DAYS.    Report Status 08/01/2015 FINAL  Final     Time coordinating discharge: Over 30 minutes  SIGNED:   Alba Cory, MD  [facility] 08/02/2015, 10:10 AM Pager [phone]  If 7PM-7AM, please contact night-coverage [email or web address] Password [email or web address]

## 2015-08-02 NOTE — Progress Notes (Signed)
Patient Cell Phone Number [phone]

## 2015-08-02 NOTE — Care Management Note (Signed)
Case Management Note  Patient Details  Name: Alice [name] MRN: [id number] Date of Birth: [birth year]  Subjective/Objective:                  neck pain/diskitis Action/Plan: Discharge planning Expected Discharge Date:  08/02/15               Expected Discharge Plan:  Home/Self Care  In-House Referral:  Clinical Social Work  Discharge planning Services  CM Consult  Post Acute Care Choice:    Choice offered to:     DME Arranged:  Hospital bed DME Agency:  [facility]  HH Arranged:    [facility] Agency:     Status of Service:  Completed, signed off  If discussed at [employer] of Stay Meetings, dates discussed:    Additional Comments: CM following up from previous CM and per note, previous CM  has faxed the prescription to [facility] for IM injections. This CM has called [facility] DME rep Jermaine to please arrange for hospital bed delivery.  NO other CM needs were communicated. Yves Dill, RN 08/02/2015, 10:52 AM

## 2015-08-02 NOTE — Progress Notes (Signed)
Patient discharge home stable. Did not want to wait for [facility] to bring the papers for her to sign for hospital bed delivery.discharge instructions given and verbalized understanding

## 2015-08-04 ENCOUNTER — Ambulatory Visit: Payer: Medicaid Other | Admitting: Family Medicine

## 2015-08-05 ENCOUNTER — Ambulatory Visit: Payer: Medicaid Other | Admitting: Nurse Practitioner

## 2015-08-05 ENCOUNTER — Encounter: Payer: Self-pay | Admitting: Nurse Practitioner

## 2015-08-07 ENCOUNTER — Telehealth: Payer: Self-pay | Admitting: Nurse Practitioner

## 2015-08-07 ENCOUNTER — Ambulatory Visit: Payer: Medicaid Other | Admitting: Family Medicine

## 2015-08-08 ENCOUNTER — Encounter: Payer: Self-pay | Admitting: Nurse Practitioner

## 2015-08-12 NOTE — Telephone Encounter (Signed)
Pt given appt with Dr.Bradshaw 8/16 at 8:25.

## 2015-08-18 ENCOUNTER — Telehealth: Payer: Self-pay

## 2015-08-18 ENCOUNTER — Telehealth: Payer: Self-pay | Admitting: Infectious Disease

## 2015-08-18 NOTE — Telephone Encounter (Signed)
[facility] callilng Patient had ZERO doses of her IM rocephin since August 1st. NON COMPLIANt.  Claims she is bleeding from mouth and nose and has bumps all over.   My advice would be pt to go to ED.   I supsect this must be IVDU pt

## 2015-08-20 NOTE — Telephone Encounter (Signed)
Error

## 2015-08-22 ENCOUNTER — Telehealth: Payer: Self-pay | Admitting: *Deleted

## 2015-08-22 NOTE — Telephone Encounter (Signed)
Patient stated the bleeding from her nose, ears, and mouth has stopped. She plans to restart the IM rocephin and keep her follow up appointment 8/15 with Dr. Luciana Axeomer. Andree CossHowell, Sevan Mcbroom M, RN

## 2015-08-26 ENCOUNTER — Encounter: Payer: Self-pay | Admitting: Internal Medicine

## 2015-08-26 ENCOUNTER — Ambulatory Visit (INDEPENDENT_AMBULATORY_CARE_PROVIDER_SITE_OTHER): Payer: Medicaid Other | Admitting: Internal Medicine

## 2015-08-26 VITALS — BP 172/117 | HR 92 | Temp 97.8°F | Ht 72.0 in | Wt 131.0 lb

## 2015-08-26 DIAGNOSIS — S12500A Unspecified displaced fracture of sixth cervical vertebra, initial encounter for closed fracture: Secondary | ICD-10-CM

## 2015-08-26 DIAGNOSIS — F1121 Opioid dependence, in remission: Secondary | ICD-10-CM

## 2015-08-26 DIAGNOSIS — M4642 Discitis, unspecified, cervical region: Secondary | ICD-10-CM

## 2015-08-26 MED ORDER — CEFTRIAXONE SODIUM 250 MG IJ SOLR
1.0000 g | Freq: Once | INTRAMUSCULAR | Status: AC
  Administered 2015-08-26: 1 g via INTRAMUSCULAR

## 2015-08-26 NOTE — Assessment & Plan Note (Signed)
Pathological fracture from discitis.  Will need follow up with Dr. Bevely Palmeritty for guidance of the collar and likely MRI.  I will arrange both when she returns in 4 weeks.

## 2015-08-26 NOTE — Assessment & Plan Note (Signed)
Counseled on the need to avoid heroin and other drugs

## 2015-08-26 NOTE — Assessment & Plan Note (Signed)
I discussed with her the severity of her infection and need for IV/IM therapy for optimal treatment.  I offered admission back to the hospital to get set up for SNF vs continued IM ceftriaxone vs likely suboptimal oral therapy.  She did opt to go back for IM injections so we will arrange again and follow up in 4 weeks.

## 2015-08-26 NOTE — Progress Notes (Signed)
   Subjective:    Patient ID: Alice [name], female    DOB: Mar 27, 1965, 50 y.o.   MRN: [id number]  HPI Here for follow up of discitis.    Alice [name] is a 50 y.o. female with active IVDU of heroin, crack cocaine who initially fell in June and went to [facility] with complaints of neck pain.  She reported to them that she slept in a recliner and woke up with pain. She had a fever then as well.  She [facility] in July to [facility] with continued pain and MRI done notable for fracture of C7 vertebral body and discitis/osteomyelitis of C6-7.  She was seen by Dr. Bevely Palmeritty of neurosurgery and no surgical intervention required and her culture grew Streptococcus.  I placed her on ceftriaxone with a goal of at least 6 weeks of IV medication.  I recommended IV therapy via picc line at a supervised location/SNF but she refused so we arranged daily IM injection of 2 grams ceftriaxone in [place].     Since she was discharged, I have been called by the [facility] stay [facility] since she was only coming about 2-3 times per week and then last week we were informed she had stopped going since august 1st due to some bleeding in her mouth and ears.  She did not get evaluated for this and it since has stopped, though she has not yet been back to restart her IM injection.  In fact she apparently needs a resubmission of the orders.  She complains of pain.   She also has hepatitis C.     Review of Systems  Constitutional: Negative for chills, fatigue and fever.  Gastrointestinal: Negative for diarrhea and nausea.  Musculoskeletal: Positive for myalgias, neck pain and neck stiffness.  Skin: Negative for rash.  Neurological: Negative for dizziness, light-headedness and headaches.       Objective:   Physical Exam  Constitutional: She is oriented to person, place, and time. She appears well-developed and well-nourished.  Eyes: No scleral icterus.  Cardiovascular: Normal rate, regular rhythm and normal heart sounds.     Pulmonary/Chest: Effort normal and breath sounds normal. No respiratory distress.  Musculoskeletal:  Neck brace in place  Neurological: She is alert and oriented to person, place, and time.  Skin: No rash noted.    Social History   Social History  . Marital status: Widowed    Spouse name: N/A  . Number of children: N/A  . Years of education: N/A   Occupational History  . Not on file.   Social History Main Topics  . Smoking status: Current Every Day Smoker    Packs/day: 0.50  . Smokeless tobacco: Never Used  . Alcohol use No  . Drug use: No     Comment: last use 04/2015 per patient  . Sexual activity: No   Other Topics Concern  . Not on file   Social History Narrative   Single, 2 children; disabled.         Assessment & Plan:

## 2015-08-27 ENCOUNTER — Ambulatory Visit: Payer: Medicaid Other | Admitting: Family Medicine

## 2015-08-28 ENCOUNTER — Ambulatory Visit: Payer: Medicaid Other | Admitting: Family Medicine

## 2015-08-29 ENCOUNTER — Ambulatory Visit (INDEPENDENT_AMBULATORY_CARE_PROVIDER_SITE_OTHER): Payer: Medicaid Other | Admitting: Family Medicine

## 2015-08-29 ENCOUNTER — Encounter: Payer: Self-pay | Admitting: Family Medicine

## 2015-08-29 VITALS — BP 110/68 | HR 81 | Temp 97.3°F | Ht 72.0 in | Wt 132.0 lb

## 2015-08-29 DIAGNOSIS — M4642 Discitis, unspecified, cervical region: Secondary | ICD-10-CM

## 2015-08-29 NOTE — Progress Notes (Signed)
   HPI  Patient presents today here for follow-up of acute discitis.  Patient was admitted to the hospital on July 18 with acute discitis in the cervical region. She freely admitted me at the visit when she presented with neck pain prior to being transferred to the emergency room that she was an IV drug abuser and requested at that time for me not to give her pain medications.  Today she comes in asking if she her Rocephin injections in our clinic. She also requests oxycodone, I offered the possibility of tramadol and she told me she did not believe this medication would help her pain.  She has severe neck pain, she's currently using ibuprofen to help it. It is only minimally helpful.  She was offered admission to SNF with PICC line where she would've received likely more intense pain treatment.   She now understands the importance of getting her Rocephin injections and aches that she's tolerating it okay, however, and he remained easier to stay compliant.  PMH: Smoking status noted ROS: Per HPI  Objective: BP 110/68   Pulse 81   Temp 97.3 F (36.3 C) (Oral)   Ht 6' (1.829 m)   Wt 132 lb (59.9 kg)   LMP 07/30/2011   BMI 17.90 kg/m  Gen: NAD, alert, cooperative with exam HEENT: NCAT Neck: Collar in placed CV: RRR, 2 to 3/6 systolic murmur Resp: CTABL, no wheezes, non-labored Ext: No edema, warm Neuro: Alert and oriented, No gross deficits  Assessment and plan:  # Cervical discitis Actively being treated She's in a very unfortunate and difficult situation. I believe she has on the right track to improve and get good treatment receiving IM Rocephin as discussed with infectious disease. I have declined her request for narcotic treatment today given her history of IV drug abuse and polysubstance abuse. I did offer her tramadol, which she declines.   I will check with our nursing staff to see if we can facilitate her Rocephin injections, she would [facility] the [facility].    Murtis SinkSam Finneus Kaneshiro, MD [facility] 08/29/2015, 3:46 PM

## 2015-08-29 NOTE — Patient Instructions (Signed)
Great to see you!  I will discuss the possibility of injections here with our nursing staff and ask Dr. Luciana Axeomer his opinion if we can facilitate you. We will try to let you know within 1 week.

## 2015-09-05 ENCOUNTER — Telehealth: Payer: Self-pay | Admitting: *Deleted

## 2015-09-05 NOTE — Telephone Encounter (Signed)
Since 08/27/15 the patient has missed 4 doses of IM Rocephin at [facility].  The patient has shared that she is having transportation issues.  Selena BattenKim, RN, wanted Dr. Luciana Axeomer to know about this situation.  The phone number at [facility] is [phone], ext [phone] for return calls.

## 2015-09-18 ENCOUNTER — Encounter: Payer: Self-pay | Admitting: Family Medicine

## 2015-09-18 ENCOUNTER — Ambulatory Visit (INDEPENDENT_AMBULATORY_CARE_PROVIDER_SITE_OTHER): Payer: Medicaid Other

## 2015-09-18 ENCOUNTER — Telehealth: Payer: Self-pay

## 2015-09-18 ENCOUNTER — Ambulatory Visit (INDEPENDENT_AMBULATORY_CARE_PROVIDER_SITE_OTHER): Payer: Medicaid Other | Admitting: Family Medicine

## 2015-09-18 VITALS — BP 139/79 | HR 75 | Temp 98.0°F | Ht 73.83 in | Wt 132.6 lb

## 2015-09-18 DIAGNOSIS — R05 Cough: Secondary | ICD-10-CM

## 2015-09-18 DIAGNOSIS — R059 Cough, unspecified: Secondary | ICD-10-CM

## 2015-09-18 DIAGNOSIS — M4642 Discitis, unspecified, cervical region: Secondary | ICD-10-CM | POA: Diagnosis not present

## 2015-09-18 DIAGNOSIS — R42 Dizziness and giddiness: Secondary | ICD-10-CM

## 2015-09-18 MED ORDER — ALBUTEROL SULFATE HFA 108 (90 BASE) MCG/ACT IN AERS: 2.0000 | INHALATION_SPRAY | Freq: Four times a day (QID) | RESPIRATORY_TRACT | 0 refills | Status: DC | PRN

## 2015-09-18 NOTE — Progress Notes (Signed)
HPI  Patient presents today here with dizziness and rightward deviation.  Patient has a recent medical history of cervical discitis treated with IM Rocephin, she's missed a few doses due to difficulty with transportation.  She complains of 2 days onset of a feeling of being off balance, she denies any room spinning sensation. She denies any fevers, chills, or sweats.  She does complain of cough and mild shortness of breath over the last 2 weeks. She does smoke. She's tolerating food and fluids normally.  PMH: Smoking status noted ROS: Per HPI  Objective: BP 139/79   Pulse 75   Temp 98 F (36.7 C) (Oral)   Ht 6' 1.83" (1.875 m)   Wt 132 lb 9.6 oz (60.1 kg)   LMP 07/30/2011   BMI 17.10 kg/m  Gen: NAD, alert, cooperative with exam HEENT: NCAT, EOMI, PERRL Neck- Neck brace in place CV: RRR, good S1/S2, no murmur Resp: Nonlabored, expiratory wheezes mild, scattered Ext: No edema, warm Neuro: Alert and oriented, cranial nerves II through XII intact, strength 5/5 and sensation intact in all 4 extremities, 2+ patellar tendon reflexes, normal gait observed   CXR- hyperexpanded, no infiltrate  Assessment and plan:  # Dizziness, cervical discitis With new symptoms of dizziness and rightward gait deviation I'm very concerned about CNS spread of her infection. Stat MRI of the cervical spine and brain ordered. Her neurologic exam is reassuring  # Cough Likely acute bronchitis, chest x-ray does not show signs of infection. Given albuterol, counseled to quit smoking. Low Threshold for return    Orders Placed This Encounter  Procedures  . MR Brain Wo Contrast    Standing Status:   Future    Standing Expiration Date:   11/17/2016    Order Specific Question:   Reason for Exam (SYMPTOM  OR DIAGNOSIS REQUIRED)    Answer:   discitis, now with Rightward gait deviation and  dizziness    Order Specific Question:   Preferred imaging location?    Answer:   External    Order  Specific Question:   What is the patient's sedation requirement?    Answer:   No Sedation    Order Specific Question:   Does the patient have a pacemaker or implanted devices?    Answer:   No  . MR Cervical Spine Wo Contrast    Standing Status:   Future    Standing Expiration Date:   11/17/2016    Order Specific Question:   Reason for Exam (SYMPTOM  OR DIAGNOSIS REQUIRED)    Answer:   discitis, now with Rightward gait deviation and  dizziness    Order Specific Question:   Preferred imaging location?    Answer:   External    Order Specific Question:   What is the patient's sedation requirement?    Answer:   No Sedation    Order Specific Question:   Does the patient have a pacemaker or implanted devices?    Answer:   No  . DG Chest 2 View    Standing Status:   Future    Number of Occurrences:   1    Standing Expiration Date:   11/17/2016    Order Specific Question:   Reason for Exam (SYMPTOM  OR DIAGNOSIS REQUIRED)    Answer:   cough    Order Specific Question:   Is the patient pregnant?    Answer:   No    Order Specific Question:   Preferred imaging location?  Answer:   Internal     Murtis Sink, MD [facility] 09/18/2015, 11:56 AM

## 2015-09-18 NOTE — Patient Instructions (Signed)
Great to see you!  We ware arranging for you a stat MRI  Your chest x ray does not show pneumonia, I have prescribed an an inhaler to help with your cough. Please come bcak if you are getting worse or do not get better as expected.

## 2015-09-18 NOTE — Telephone Encounter (Signed)
Patient left message on voicemail wanting to get permission from Dr.Comer to have Rocephin IM injection performed at primary care provider. Called mobile number on file and spoke with Bertis Ruddyharles Bryant that stated patient sometimes uses his phone but was not available at this time. Leonette MostCharles stated he will give patient message to call back to clinic. Dr. Luciana Axeomer made aware. Rejeana Brockandace Josie Burleigh, LPN

## 2015-09-22 ENCOUNTER — Telehealth: Payer: Self-pay | Admitting: Nurse Practitioner

## 2015-09-22 ENCOUNTER — Telehealth: Payer: Self-pay | Admitting: Family Medicine

## 2015-09-22 NOTE — Telephone Encounter (Signed)
Please advise and route to Pool A 

## 2015-09-22 NOTE — Telephone Encounter (Signed)
See previous note

## 2015-09-22 NOTE — Telephone Encounter (Signed)
Advised patient that she should go to the ER and she states that she is on her way now to have MRIs done and that she has them scheduled. Advised she would call us with results

## 2015-09-22 NOTE — Telephone Encounter (Signed)
I explained that we should evlauate for that and we had Stat MRI's ordered that she did not go to.   She may go to the emergency room or return to the clinic but I cannot place her in the hospital for the suspicion.   Murtis SinkSam Natalie Mceuen, MD [facility] 09/22/2015, 9:59 AM

## 2015-09-23 ENCOUNTER — Ambulatory Visit: Payer: Self-pay | Admitting: Internal Medicine

## 2015-09-23 ENCOUNTER — Telehealth: Payer: Self-pay

## 2015-09-23 NOTE — Telephone Encounter (Signed)
Attempted to convince her to go to the ER but patient refused.

## 2015-09-23 NOTE — Telephone Encounter (Signed)
Called patient again telling her she needed to go to the ER. Patient refused since she was not coughing up blood anymore. Advised patient two more times to go to the ER but she still refused.

## 2015-09-23 NOTE — Telephone Encounter (Signed)
Patient returned my phone call and advised her to go to the Emergency room. Patient states that she was not going to go because she was no longer spitting up blood anymore. Patient states that yesterday for 3 minutes she was coughing and spitting up blood. Also states to have a sore throat. Advised patient I would sent to Dr.Bradshaw and call her back. Would like to be called back on [phone]. Please advise.

## 2015-09-23 NOTE — Telephone Encounter (Signed)
I think she absolutely needs ED eval. Thank you for emphasizing.   Murtis SinkSam Ryanne Morand, MD [facility] 09/23/2015, 10:20 AM

## 2015-09-23 NOTE — Telephone Encounter (Signed)
She needs to go the the Emergency room, she is being notified now by my CMA.   Murtis SinkSam Bradshaw, MD [facility] 09/23/2015, 9:49 AM

## 2015-09-23 NOTE — Telephone Encounter (Signed)
Number not working number. Other number on file was not patients phone and left a message with a gentleman for patient to call me back

## 2015-09-26 ENCOUNTER — Telehealth: Payer: Self-pay | Admitting: Family Medicine

## 2015-09-26 NOTE — Telephone Encounter (Signed)
[email or web address]

## 2015-09-29 ENCOUNTER — Telehealth: Payer: Self-pay | Admitting: Family Medicine

## 2015-09-29 NOTE — Telephone Encounter (Signed)
Receiving rocephin injections at [facility] for a reported infection in her neck. States that this was ordered by Dr Luciana Axeomer who is an infectious disease doctor. She also said that Dr Ermalinda MemosBradshaw had been in contact with Dr comer about this and said that we could administer the injections here since she has trouble getting to [facility]. I didn't see this documentation but I feel this is reasonable if Dr Luciana Axeomer will send an order. Patient to call him in the morning and will follow up with us to schedule appt with nurse clinic after order received.

## 2015-10-02 ENCOUNTER — Telehealth: Payer: Self-pay

## 2015-10-02 NOTE — Telephone Encounter (Signed)
Voice Mail:  Alice RuddyCharles [name] is calling and stated Alice AasDoris needs a new order to continue her injections.  The message was very confusing and returned call but no one answered.  Message left to call to explain the situation.   Alice Josephsammy K Croix Presley, RN

## 2015-10-03 ENCOUNTER — Inpatient Hospital Stay (HOSPITAL_COMMUNITY)
Admission: EM | Admit: 2015-10-03 | Discharge: 2015-10-05 | DRG: 065 | Discharge status: Against Medical Advice | Payer: Medicaid Other | Attending: Internal Medicine | Admitting: Internal Medicine

## 2015-10-03 ENCOUNTER — Encounter (HOSPITAL_COMMUNITY): Payer: Self-pay | Admitting: Emergency Medicine

## 2015-10-03 ENCOUNTER — Other Ambulatory Visit: Payer: Self-pay | Admitting: *Deleted

## 2015-10-03 ENCOUNTER — Emergency Department (HOSPITAL_COMMUNITY): Payer: Medicaid Other

## 2015-10-03 DIAGNOSIS — R011 Cardiac murmur, unspecified: Secondary | ICD-10-CM

## 2015-10-03 DIAGNOSIS — F1010 Alcohol abuse, uncomplicated: Secondary | ICD-10-CM | POA: Diagnosis present

## 2015-10-03 DIAGNOSIS — F17210 Nicotine dependence, cigarettes, uncomplicated: Secondary | ICD-10-CM | POA: Diagnosis present

## 2015-10-03 DIAGNOSIS — Z809 Family history of malignant neoplasm, unspecified: Secondary | ICD-10-CM

## 2015-10-03 DIAGNOSIS — G8929 Other chronic pain: Secondary | ICD-10-CM | POA: Diagnosis present

## 2015-10-03 DIAGNOSIS — E876 Hypokalemia: Secondary | ICD-10-CM | POA: Diagnosis present

## 2015-10-03 DIAGNOSIS — I340 Nonrheumatic mitral (valve) insufficiency: Secondary | ICD-10-CM | POA: Diagnosis present

## 2015-10-03 DIAGNOSIS — E785 Hyperlipidemia, unspecified: Secondary | ICD-10-CM | POA: Diagnosis present

## 2015-10-03 DIAGNOSIS — R29810 Facial weakness: Secondary | ICD-10-CM | POA: Diagnosis present

## 2015-10-03 DIAGNOSIS — F1910 Other psychoactive substance abuse, uncomplicated: Secondary | ICD-10-CM | POA: Diagnosis not present

## 2015-10-03 DIAGNOSIS — I639 Cerebral infarction, unspecified: Secondary | ICD-10-CM | POA: Diagnosis present

## 2015-10-03 DIAGNOSIS — I10 Essential (primary) hypertension: Secondary | ICD-10-CM | POA: Diagnosis present

## 2015-10-03 DIAGNOSIS — R4781 Slurred speech: Secondary | ICD-10-CM | POA: Diagnosis present

## 2015-10-03 DIAGNOSIS — M4642 Discitis, unspecified, cervical region: Secondary | ICD-10-CM | POA: Diagnosis present

## 2015-10-03 DIAGNOSIS — R200 Anesthesia of skin: Secondary | ICD-10-CM | POA: Diagnosis not present

## 2015-10-03 DIAGNOSIS — D72829 Elevated white blood cell count, unspecified: Secondary | ICD-10-CM | POA: Diagnosis present

## 2015-10-03 DIAGNOSIS — N179 Acute kidney failure, unspecified: Secondary | ICD-10-CM | POA: Diagnosis present

## 2015-10-03 DIAGNOSIS — R29704 NIHSS score 4: Secondary | ICD-10-CM | POA: Diagnosis present

## 2015-10-03 DIAGNOSIS — Z8249 Family history of ischemic heart disease and other diseases of the circulatory system: Secondary | ICD-10-CM | POA: Diagnosis not present

## 2015-10-03 DIAGNOSIS — D649 Anemia, unspecified: Secondary | ICD-10-CM | POA: Diagnosis present

## 2015-10-03 DIAGNOSIS — Z9119 Patient's noncompliance with other medical treatment and regimen: Secondary | ICD-10-CM | POA: Diagnosis not present

## 2015-10-03 DIAGNOSIS — M4640 Discitis, unspecified, site unspecified: Secondary | ICD-10-CM | POA: Diagnosis present

## 2015-10-03 DIAGNOSIS — I6350 Cerebral infarction due to unspecified occlusion or stenosis of unspecified cerebral artery: Secondary | ICD-10-CM | POA: Diagnosis not present

## 2015-10-03 DIAGNOSIS — F1120 Opioid dependence, uncomplicated: Secondary | ICD-10-CM | POA: Diagnosis present

## 2015-10-03 DIAGNOSIS — F17200 Nicotine dependence, unspecified, uncomplicated: Secondary | ICD-10-CM | POA: Diagnosis present

## 2015-10-03 DIAGNOSIS — I6930 Unspecified sequelae of cerebral infarction: Secondary | ICD-10-CM | POA: Diagnosis present

## 2015-10-03 HISTORY — DX: Anemia, unspecified: D64.9

## 2015-10-03 LAB — COMPREHENSIVE METABOLIC PANEL
ALBUMIN: 3.7 g/dL (ref 3.5–5.0)
ALT: 11 U/L — AB (ref 14–54)
ANION GAP: 8 (ref 5–15)
AST: 16 U/L (ref 15–41)
Alkaline Phosphatase: 76 U/L (ref 38–126)
BUN: 16 mg/dL (ref 6–20)
CHLORIDE: 99 mmol/L — AB (ref 101–111)
CO2: 31 mmol/L (ref 22–32)
CREATININE: 1.03 mg/dL — AB (ref 0.44–1.00)
Calcium: 9.4 mg/dL (ref 8.9–10.3)
GFR calc non Af Amer: >60 mL/min (ref >60)
GLUCOSE: 99 mg/dL (ref 65–99)
Potassium: 3.2 mmol/L — ABNORMAL LOW (ref 3.5–5.1)
SODIUM: 138 mmol/L (ref 135–145)
Total Bilirubin: 0.5 mg/dL (ref 0.3–1.2)
Total Protein: 8 g/dL (ref 6.5–8.1)

## 2015-10-03 LAB — URINALYSIS, ROUTINE W REFLEX MICROSCOPIC
BILIRUBIN URINE: NEGATIVE
Glucose, UA: NEGATIVE mg/dL
HGB URINE DIPSTICK: NEGATIVE
Ketones, ur: NEGATIVE mg/dL
Nitrite: NEGATIVE
PH: 5.5 (ref 5.0–8.0)
Protein, ur: NEGATIVE mg/dL
SPECIFIC GRAVITY, URINE: 1.01 (ref 1.005–1.030)

## 2015-10-03 LAB — CBC
HCT: 43.7 % (ref 36.0–46.0)
Hemoglobin: 14.3 g/dL (ref 12.0–15.0)
MCH: 28.3 pg (ref 26.0–34.0)
MCHC: 32.7 g/dL (ref 30.0–36.0)
MCV: 86.4 fL (ref 78.0–100.0)
PLATELETS: 215 10*3/uL (ref 150–400)
RBC: 5.06 MIL/uL (ref 3.87–5.11)
RDW: 14.8 % (ref 11.5–15.5)
WBC: 13.9 10*3/uL — AB (ref 4.0–10.5)

## 2015-10-03 LAB — I-STAT TROPONIN, ED: Troponin i, poc: 0 ng/mL (ref 0.00–0.08)

## 2015-10-03 LAB — RAPID URINE DRUG SCREEN, HOSP PERFORMED
AMPHETAMINES: NOT DETECTED
BENZODIAZEPINES: NOT DETECTED
Barbiturates: NOT DETECTED
Cocaine: POSITIVE — AB
OPIATES: POSITIVE — AB
Tetrahydrocannabinol: NOT DETECTED

## 2015-10-03 LAB — DIFFERENTIAL
BASOS PCT: 0 %
Basophils Absolute: 0 10*3/uL (ref 0.0–0.1)
Eosinophils Absolute: 0.1 10*3/uL (ref 0.0–0.7)
Eosinophils Relative: 1 %
Lymphocytes Relative: 20 %
Lymphs Abs: 2.8 10*3/uL (ref 0.7–4.0)
MONO ABS: 1 10*3/uL (ref 0.1–1.0)
Monocytes Relative: 7 %
NEUTROS ABS: 10.1 10*3/uL — AB (ref 1.7–7.7)
NEUTROS PCT: 72 %

## 2015-10-03 LAB — ETHANOL

## 2015-10-03 LAB — URINE MICROSCOPIC-ADD ON: RBC / HPF: NONE SEEN RBC/hpf (ref 0–5)

## 2015-10-03 LAB — APTT: APTT: 51 s — AB (ref 24–36)

## 2015-10-03 LAB — PROTIME-INR
INR: 1.11
PROTHROMBIN TIME: 14.4 s (ref 11.4–15.2)

## 2015-10-03 LAB — I-STAT CG4 LACTIC ACID, ED: LACTIC ACID, VENOUS: 1.4 mmol/L (ref 0.5–1.9)

## 2015-10-03 MED ORDER — POTASSIUM CHLORIDE CRYS ER 20 MEQ PO TBCR
40.0000 meq | EXTENDED_RELEASE_TABLET | Freq: Once | ORAL | Status: AC
  Administered 2015-10-03: 40 meq via ORAL
  Filled 2015-10-03: qty 2

## 2015-10-03 MED ORDER — STROKE: EARLY STAGES OF RECOVERY BOOK
Freq: Once | Status: AC
  Administered 2015-10-03: 23:00:00

## 2015-10-03 MED ORDER — AMOXICILLIN 500 MG PO TABS: 500.0000 mg | ORAL_TABLET | Freq: Three times a day (TID) | ORAL | 1 refills | Status: DC

## 2015-10-03 MED ORDER — CEFTRIAXONE SODIUM 2 G IJ SOLR
2.0000 g | INTRAMUSCULAR | Status: DC
  Administered 2015-10-03 – 2015-10-04 (×2): 2 g via INTRAVENOUS
  Filled 2015-10-03 (×3): qty 2

## 2015-10-03 MED ORDER — LISINOPRIL 10 MG PO TABS
10.0000 mg | ORAL_TABLET | Freq: Every day | ORAL | Status: DC
Start: 2015-10-04 — End: 2015-10-05
  Administered 2015-10-04 – 2015-10-05 (×2): 10 mg via ORAL
  Filled 2015-10-03 (×2): qty 1

## 2015-10-03 MED ORDER — ACETAMINOPHEN 325 MG PO TABS
650.0000 mg | ORAL_TABLET | ORAL | Status: DC | PRN
  Administered 2015-10-03 – 2015-10-04 (×2): 650 mg via ORAL
  Filled 2015-10-03 (×2): qty 2

## 2015-10-03 MED ORDER — ASPIRIN EC 81 MG PO TBEC
81.0000 mg | DELAYED_RELEASE_TABLET | Freq: Every day | ORAL | Status: DC
Start: 2015-10-04 — End: 2015-10-05
  Administered 2015-10-04 – 2015-10-05 (×2): 81 mg via ORAL
  Filled 2015-10-03 (×2): qty 1

## 2015-10-03 MED ORDER — ACETAMINOPHEN 650 MG RE SUPP: 650.0000 mg | RECTAL | Status: DC | PRN
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
## 2015-10-03 NOTE — Telephone Encounter (Signed)
Kim called back and stated out of 38 visits patient has only showed for 13. Call back # [phone], ext [phone].

## 2015-10-03 NOTE — ED Triage Notes (Signed)
Per [facility], pt c/o slurred speech and "not feeling good" x3 days. Pt hx of drug use and has pinpoint pupils. Pt is AAOX4. Denies pain.
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
## 2015-10-03 NOTE — ED Notes (Signed)
Pt here for slurred speech x3 days, also c/o L sided facial numbness. HX of IV drug use, has fresh track marks on R wrist. Pt appears to be under the influence of unknown substance, states she last used cocaine yesterday. NIH  Is 2. Passed swallow screen. Pt is AAOX4. Denies pain. VSS.

## 2015-10-03 NOTE — Telephone Encounter (Signed)
Kim RN call to advise that the patient has not been to have her treatment since 09/21/15 and they have not been able to reach her. She was wanting to know if they could cancel the order and stop making appointments that she will not show for. Asked if we could call her back with an answer.
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
## 2015-10-03 NOTE — Progress Notes (Signed)
Patient arrived to 5M11 AAOx4 but drowsy. MD is at bedside with pt. Will take vitals and place tele when she is finished. Pt's visitor is at the bedside.  Alice [name], [place] ScrapeSarah E, RN

## 2015-10-03 NOTE — Progress Notes (Signed)
Patient arrived with no IV site. She says that it was pulled out in the ED.

## 2015-10-03 NOTE — ED Notes (Signed)
Pt has track marks on R wrist, states she did "IV cocaine yesterday". Denies ETOH.

## 2015-10-03 NOTE — Telephone Encounter (Signed)
Thanks.  Stop the IM ceftriaxone and if you can reach her, she can take amoxicillin 500 mg three times a day for 30 days and 1 refill.

## 2015-10-03 NOTE — ED Notes (Signed)
Pt to CT

## 2015-10-03 NOTE — ED Notes (Signed)
Pt returned from CT °

## 2015-10-03 NOTE — Telephone Encounter (Signed)
RN notified to discontinue ceftriaxone. Patient notified and Rx sent to her pharmacy. Alice MolaJacqueline Cockerham

## 2015-10-04 ENCOUNTER — Inpatient Hospital Stay (HOSPITAL_COMMUNITY): Payer: Medicaid Other

## 2015-10-04 ENCOUNTER — Encounter (HOSPITAL_COMMUNITY): Payer: Self-pay | Admitting: Internal Medicine

## 2015-10-04 DIAGNOSIS — D72829 Elevated white blood cell count, unspecified: Secondary | ICD-10-CM | POA: Diagnosis present

## 2015-10-04 DIAGNOSIS — I6930 Unspecified sequelae of cerebral infarction: Secondary | ICD-10-CM | POA: Diagnosis present

## 2015-10-04 DIAGNOSIS — I6350 Cerebral infarction due to unspecified occlusion or stenosis of unspecified cerebral artery: Secondary | ICD-10-CM

## 2015-10-04 DIAGNOSIS — I10 Essential (primary) hypertension: Secondary | ICD-10-CM | POA: Diagnosis present

## 2015-10-04 DIAGNOSIS — N179 Acute kidney failure, unspecified: Secondary | ICD-10-CM | POA: Diagnosis present

## 2015-10-04 DIAGNOSIS — E876 Hypokalemia: Secondary | ICD-10-CM | POA: Diagnosis present

## 2015-10-04 DIAGNOSIS — M4640 Discitis, unspecified, site unspecified: Secondary | ICD-10-CM | POA: Diagnosis present

## 2015-10-04 DIAGNOSIS — F1910 Other psychoactive substance abuse, uncomplicated: Secondary | ICD-10-CM | POA: Diagnosis present

## 2015-10-04 DIAGNOSIS — E785 Hyperlipidemia, unspecified: Secondary | ICD-10-CM

## 2015-10-04 LAB — LIPID PANEL
CHOL/HDL RATIO: 4.6 ratio
CHOLESTEROL: 165 mg/dL (ref 0–200)
HDL: 36 mg/dL — ABNORMAL LOW (ref >40)
LDL CALC: 115 mg/dL — AB (ref 0–99)
Triglycerides: 68 mg/dL (ref <150)
VLDL: 14 mg/dL (ref 0–40)

## 2015-10-04 LAB — BASIC METABOLIC PANEL
ANION GAP: 13 (ref 5–15)
BUN: 14 mg/dL (ref 6–20)
CALCIUM: 9.7 mg/dL (ref 8.9–10.3)
CHLORIDE: 100 mmol/L — AB (ref 101–111)
CO2: 29 mmol/L (ref 22–32)
CREATININE: 0.81 mg/dL (ref 0.44–1.00)
GFR calc non Af Amer: >60 mL/min (ref >60)
Glucose, Bld: 112 mg/dL — ABNORMAL HIGH (ref 65–99)
Potassium: 3.1 mmol/L — ABNORMAL LOW (ref 3.5–5.1)
SODIUM: 142 mmol/L (ref 135–145)

## 2015-10-04 LAB — CBC WITH DIFFERENTIAL/PLATELET
BASOS ABS: 0 10*3/uL (ref 0.0–0.1)
BASOS PCT: 0 %
EOS ABS: 0.1 10*3/uL (ref 0.0–0.7)
Eosinophils Relative: 1 %
HEMATOCRIT: 44.3 % (ref 36.0–46.0)
HEMOGLOBIN: 14.4 g/dL (ref 12.0–15.0)
Lymphocytes Relative: 22 %
Lymphs Abs: 2.8 10*3/uL (ref 0.7–4.0)
MCH: 28.1 pg (ref 26.0–34.0)
MCHC: 32.5 g/dL (ref 30.0–36.0)
MCV: 86.5 fL (ref 78.0–100.0)
MONOS PCT: 7 %
Monocytes Absolute: 0.9 10*3/uL (ref 0.1–1.0)
NEUTROS ABS: 8.8 10*3/uL — AB (ref 1.7–7.7)
NEUTROS PCT: 70 %
Platelets: 261 10*3/uL (ref 150–400)
RBC: 5.12 MIL/uL — ABNORMAL HIGH (ref 3.87–5.11)
RDW: 14.4 % (ref 11.5–15.5)
WBC: 12.7 10*3/uL — AB (ref 4.0–10.5)

## 2015-10-04 MED ORDER — LORAZEPAM 2 MG/ML IJ SOLN
1.0000 mg | Freq: Four times a day (QID) | INTRAMUSCULAR | Status: DC | PRN
  Administered 2015-10-04 – 2015-10-05 (×3): 1 mg via INTRAVENOUS
  Filled 2015-10-04 (×3): qty 1

## 2015-10-04 MED ORDER — POTASSIUM CHLORIDE CRYS ER 20 MEQ PO TBCR
40.0000 meq | EXTENDED_RELEASE_TABLET | Freq: Once | ORAL | Status: AC
  Administered 2015-10-04: 40 meq via ORAL
  Filled 2015-10-04: qty 2

## 2015-10-04 MED ORDER — IOPAMIDOL (ISOVUE-370) INJECTION 76%
INTRAVENOUS | Status: AC
  Administered 2015-10-05: 50 mL
  Filled 2015-10-04: qty 50

## 2015-10-04 MED ORDER — ATORVASTATIN CALCIUM 40 MG PO TABS
40.0000 mg | ORAL_TABLET | Freq: Every day | ORAL | Status: DC
  Administered 2015-10-04: 40 mg via ORAL
  Filled 2015-10-04: qty 1

## 2015-10-04 MED ORDER — HYDRALAZINE HCL 20 MG/ML IJ SOLN
10.0000 mg | Freq: Four times a day (QID) | INTRAMUSCULAR | Status: DC | PRN
  Administered 2015-10-04: 10 mg via INTRAVENOUS
  Filled 2015-10-04: qty 1

## 2015-10-04 NOTE — Progress Notes (Signed)
SLP Cancellation Note  Patient Details Name: Alice [name] MRN: [id number] DOB: 02/11/1965   Cancelled treatment:       Reason Eval/Treat Not Completed: Patient's level of consciousness;Fatigue/lethargy limiting ability to participate   Angela NevinJohn T. Preston, MA, CCC-SLP 10/04/15 2:48 PM

## 2015-10-04 NOTE — Progress Notes (Signed)
Patient resting comfortably

## 2015-10-04 NOTE — Progress Notes (Signed)
PT Cancellation Note  Patient Details Name: Vedia PereyraDoris Doukas MRN: [id number] DOB: January 29, 1965   Cancelled Treatment:    Reason Eval/Treat Not Completed: Patient's level of consciousness.  Nursing gave pt ativan to help her sleep. She is sleeping soundly with sisters visiting.  Nursing aware of delay in PT eval. Will see her tomorrow   Judson RochHildreth, Nyella Eckels Gardner 10/04/2015, 4:05 PM

## 2015-10-04 NOTE — Progress Notes (Addendum)
RN called by radiology. Pt requesting Valium and then refused MRI while down in the scan. She is being sent back up to the unit. MD notified. One picture was taken and radiology tech turned it in. Nyari Olsson, [place] ScrapeSarah E, RN   Pt says she was clastrophobic but would be willing to try again if she had something to calm her down or make her sleep. Will hand along.

## 2015-10-04 NOTE — Progress Notes (Signed)
STROKE TEAM PROGRESS NOTE   HISTORY OF PRESENT ILLNESS (per record) Alice [name] is an 50 y.o. female with a history of a pretension, cervical discitis, heart murmur and IV drug use, presenting with complaint of weakness of left upper extremity as well as numbness and weakness of left face and upper extremity for 3 days. She has no previous history of stroke nor TIA. CT scan of her head showed no acute intracranial abnormality. Blood pressure was elevated at 150/100. Patient has been on antibiotic treatment as an outpatient for discitis. NIH stroke score the time of this evaluation was 4. She was last known well from a neurologic standpoint at home 09/30/2015. Urine drug screen was positive for opiates and cocaine.   SUBJECTIVE (INTERVAL HISTORY) She continues to have mild left-sided weakness, currently complaining of moderate headache, feels slightly drowsy   OBJECTIVE Temp:  [97.7 F (36.5 C)-99.1 F (37.3 C)] 98.4 F (36.9 C) (09/23 0622) Pulse Rate:  [60-82] 82 (09/23 0622) Cardiac Rhythm: Normal sinus rhythm (09/22 2244) Resp:  [14-20] 16 (09/23 0622) BP: (151-183)/(89-111) 162/97 (09/23 0622) SpO2:  [98 %-100 %] 100 % (09/23 0622) Weight:  [56.8 kg (125 lb 3.5 oz)] 56.8 kg (125 lb 3.5 oz) (09/22 2231)  CBC:  Recent Labs Lab 10/03/15 2003 10/04/15 0225  WBC 13.9* 12.7*  NEUTROABS 10.1* 8.8*  HGB 14.3 14.4  HCT 43.7 44.3  MCV 86.4 86.5  PLT 215 261    Basic Metabolic Panel:  Recent Labs Lab 10/03/15 2003 10/04/15 0225  NA 138 142  K 3.2* 3.1*  CL 99* 100*  CO2 31 29  GLUCOSE 99 112*  BUN 16 14  CREATININE 1.03* 0.81  CALCIUM 9.4 9.7    Lipid Panel:    Component Value Date/Time   CHOL 165 10/04/2015 0225   TRIG 68 10/04/2015 0225   HDL 36 (L) 10/04/2015 0225   CHOLHDL 4.6 10/04/2015 0225   VLDL 14 10/04/2015 0225   LDLCALC 115 (H) 10/04/2015 0225   HgbA1c: No results found for: HGBA1C Urine Drug Screen:    Component Value Date/Time   LABOPIA POSITIVE  (A) 10/03/2015 1941   COCAINSCRNUR POSITIVE (A) 10/03/2015 1941   LABBENZ NONE DETECTED 10/03/2015 1941   AMPHETMU NONE DETECTED 10/03/2015 1941   THCU NONE DETECTED 10/03/2015 1941   LABBARB NONE DETECTED 10/03/2015 1941      IMAGING  Ct Head Wo Contrast 10/03/2015 No evidence of acute intracranial abnormality. Mild small vessel ischemic changes.     Mr Brain Wo Contrast 10/04/2015 Limited motion degraded single sequence of the brain: Acute RIGHT pontine infarcts.     PHYSICAL EXAM   Neurologic Examination: Mental Status: Drowsy, disoriented to current month, no acute distress.  Speech moderately slurred without evidence of aphasia. Able to follow commands without difficulty. Cranial Nerves: II-Visual fields were normal. III/IV/VI-Pupils were equal and reacted normally to light. Extraocular movements were full and conjugate.    V/VII-reduced perception of tactile sensation over left side of the face compared to the right; no facial weakness. VIII-normal. X-moderate dysarthria; symmetrical palatal movement. XI: trapezius strength/neck flexion strength normal bilaterally XII-midline tongue extension with normal strength. Motor: Mild drift of left upper extremity; motor exam otherwise unremarkable Sensory: Normal throughout. Deep Tendon Reflexes: 2+ and symmetric. Plantars: Flexor bilaterally Cerebellar: Abnormal coordination of upper extremities, left greater than right.    ASSESSMENT/PLAN Ms. Alice PereyraDoris [name] is a 50 y.o. female with history of hypertension, heart murmur, chronic low back pain, current treatment for cervical discitis, anemia,  and substance abuse.  presenting with left-sided weakness 3 days. She did not receive IV t-PA due to late presentation. Associated risk factors include hypertension history of cocaine abuse  Stroke:  Non-dominant  Infarct secondary to small vessel disease source.   MRI - Acute RIGHT pontine infarcts.   Will obtain CTA of head and  neck  2D Echo  EF 65-70%. No cardiac source of emboli identified. Mild to moderate mitral regurgitation.  LDL - 115  HgbA1c pending  VTE prophylaxis  - SCDs  Diet Heart Room service appropriate? Yes; Fluid consistency: Thin  No antithrombotic prior to admission, now on aspirin 81 mg daily  Patient counseled to be compliant with her antithrombotic medications  Ongoing aggressive stroke risk factor management  Therapy recommendations: pending  Disposition: pending  Hypertension  Stable  Permissive hypertension (OK if < 220/120) but gradually normalize in 5-7 days  Long-term BP goal normotensive  Hyperlipidemia  Home meds:  No lipid lowering medications prior to admission  LDL 115, goal < 70  Add Lipitor 40 mg daily  Continue statin at discharge    Other Stroke Risk Factors  Cigarette smoker - advised to stop smoking  UDS - positive for cocaine  Other Active Problems  UDS - positive for cocaine and opiates  Hypokalemia - supplemented  Cervical discitis - IV Rocephin  History of IV drug use - ? TEE     To contact Stroke Continuity provider, please refer to [email or web address]. After hours, contact General Neurology

## 2015-10-04 NOTE — Progress Notes (Signed)
Patient's sister Larita FifeLynn called to check up on pt's status. She voiced her concern with pt's drug use and overall mental/physical health. She feels that pt cannot leave hospital without community resources or support. Ariv Penrod, [place] ScrapeSarah E, RN

## 2015-10-04 NOTE — Progress Notes (Signed)
Patient ID: Alice [name] [name], female   DOB: October 17, 1965, 50 y.o.   MRN: [id number]  PROGRESS NOTE    Alice [name]  [id number] DOB: October 17, 1965 DOA: 10/03/2015  PCP: Bennie PieriniMary-Margaret Martin, FNP   Brief Narrative:  50 y.o. female with past medical history significant for HTN, polysubstance abuse, C6-7 discitis diagnosed in July.  She was supposed to be on six weeks of IM Rocephin (she declined placement for IV antibiotics), but she has been noncompliant so she was recently put on oral amoxicillin. [facility] to slurred speech, left face numbness and facial droop, left sided weakness in both upper and lower extremities.  She also had headache and double vision.   In ED, she was hemodynamically stable. Head CT was negative for acute CVA. The 12 lead EKG showed NSR with LVH and no acute ST segment changes.  WBC count  Was 13.9. UDS positive for opiates and cocaine. MRI showed acute right pontine infarcts.    Assessment & Plan:   Principal Problem:   Right pontine CVA (HCC) - Stroke work up initiated:  - Aspirin daily - MRI brain was limited due to motion artifact but it did show acute right pontine infarcts - Pt refused MRA - 2D ECHO - pending  - Carotid doppler - pending  - HgbA1c- pending - LDL 115 - PT eval pending  Other Stroke Risk Factors : alcohol abuse, HTN - Appreciate neurology following   Active Problems:   TOBACCO ABUSE - Counseled on smoking cessation    Opiate dependence (HCC) /  Drug abuse - UDS positive for cocaine and opiates     AKI (acute kidney injury) (HCC) - Resolved with IV fluids     Hypokalemia - Supplement - Follow up BMP in am     Dyslipidemia - Resume statin therapy    Benign essential HTN - Continue lisinopril    Discitis / Leukocytosis - Continue Rocephin  - Follow up urine culture results    DVT prophylaxis: SCD's bilaterally  Code Status: full code  Family Communication: no family at the bedside this am Disposition Plan: home  versus SNF depending on PT eval   Consultants:   Neurology  PT  Procedures:   None   Antimicrobials:   Rocephin 10/03/2015 -->   Subjective: No overnight events.  Objective: Vitals:   10/04/15 0415 10/04/15 0622 10/04/15 0800 10/04/15 0940  BP: (!) 183/110 (!) 162/97 (!) 163/99 (!) 165/99  Pulse: 76 82 88 92  Resp: 20 16 16 18   Temp: 97.7 F (36.5 C) 98.4 F (36.9 C) 98.7 F (37.1 C) 98.1 F (36.7 C)  TempSrc: Oral Oral Oral Oral  SpO2: 100% 100% 100% 100%  Weight:      Height:        Intake/Output Summary (Last 24 hours) at 10/04/15 1208 Last data filed at 10/04/15 0400  Gross per 24 hour  Intake               50 ml  Output                0 ml  Net               50 ml   Filed Weights   10/03/15 2231  Weight: 56.8 kg (125 lb 3.5 oz)    Examination:  General exam: Appears calm and comfortable  Respiratory system: Clear to auscultation. Respiratory effort normal. Cardiovascular system: S1 & S2 heard, RRR. No JVD, murmurs, rubs, gallops or clicks.  No pedal edema. Gastrointestinal system: Abdomen is nondistended, soft and nontender. No organomegaly or masses felt. Normal bowel sounds heard. Central nervous system: Alert and oriented. No focal neurological deficits. Extremities: Symmetric 5 x 5 power. Skin: No rashes, lesions or ulcers Psychiatry: Judgement and insight appear normal. Mood & affect appropriate.   Data Reviewed: I have personally reviewed following labs and imaging studies  CBC:  Recent Labs Lab 10/03/15 2003 10/04/15 0225  WBC 13.9* 12.7*  NEUTROABS 10.1* 8.8*  HGB 14.3 14.4  HCT 43.7 44.3  MCV 86.4 86.5  PLT 215 261   Basic Metabolic Panel:  Recent Labs Lab 10/03/15 2003 10/04/15 0225  NA 138 142  K 3.2* 3.1*  CL 99* 100*  CO2 31 29  GLUCOSE 99 112*  BUN 16 14  CREATININE 1.03* 0.81  CALCIUM 9.4 9.7   GFR: Estimated Creatinine Clearance: 74.5 mL/min (by C-G formula based on SCr of 0.81 mg/dL). Liver Function  Tests:  Recent Labs Lab 10/03/15 2003  AST 16  ALT 11*  ALKPHOS 76  BILITOT 0.5  PROT 8.0  ALBUMIN 3.7   No results for input(s): LIPASE, AMYLASE in the last 168 hours. No results for input(s): AMMONIA in the last 168 hours. Coagulation Profile:  Recent Labs Lab 10/03/15 2003  INR 1.11   Cardiac Enzymes: No results for input(s): CKTOTAL, CKMB, CKMBINDEX, TROPONINI in the last 168 hours. BNP (last 3 results) No results for input(s): PROBNP in the last 8760 hours. HbA1C: No results for input(s): HGBA1C in the last 72 hours. CBG: No results for input(s): GLUCAP in the last 168 hours. Lipid Profile:  Recent Labs  10/04/15 0225  CHOL 165  HDL 36*  LDLCALC 115*  TRIG 68  CHOLHDL 4.6   Thyroid Function Tests: No results for input(s): TSH, T4TOTAL, FREET4, T3FREE, THYROIDAB in the last 72 hours. Anemia Panel: No results for input(s): VITAMINB12, FOLATE, FERRITIN, TIBC, IRON, RETICCTPCT in the last 72 hours. Urine analysis:    Component Value Date/Time   COLORURINE YELLOW 10/03/2015 1941   APPEARANCEUR CLOUDY (A) 10/03/2015 1941   LABSPEC 1.010 10/03/2015 1941   PHURINE 5.5 10/03/2015 1941   GLUCOSEU NEGATIVE 10/03/2015 1941   HGBUR NEGATIVE 10/03/2015 1941   BILIRUBINUR NEGATIVE 10/03/2015 1941   BILIRUBINUR neg 10/30/2013 1427   KETONESUR NEGATIVE 10/03/2015 1941   PROTEINUR NEGATIVE 10/03/2015 1941   UROBILINOGEN negative 10/30/2013 1427   UROBILINOGEN 1.0 07/31/2011 1059   NITRITE NEGATIVE 10/03/2015 1941   LEUKOCYTESUR TRACE (A) 10/03/2015 1941   Sepsis Labs: @LABRCNTIP (procalcitonin:4,lacticidven:4)   )No results found for this or any previous visit (from the past 240 hour(s)).    Radiology Studies: Ct Head Wo Contrast Result Date: 10/03/2015 No evidence of acute intracranial abnormality. Mild small vessel ischemic changes.  Mr Brain Wo Contrast Result Date: 10/04/2015 imited motion degraded single sequence of the brain: Acute RIGHT pontine  infarcts.   Scheduled Meds: . aspirin EC  81 mg Oral Daily  . atorvastatin  40 mg Oral q1800  . cefTRIAXone (ROCEPHIN)  IV  2 g Intravenous Q24H  . lisinopril  10 mg Oral Daily   Continuous Infusions:    LOS: 1 day    Time spent: 25 minutes  Greater than 50% of the time spent on counseling and coordinating the care.   Manson Passey, MD [facility] Pager [phone]  If 7PM-7AM, please contact night-coverage [email or web address] Password [facility] 10/04/2015, 12:08 PM

## 2015-10-05 ENCOUNTER — Inpatient Hospital Stay (HOSPITAL_COMMUNITY): Payer: Medicaid Other

## 2015-10-05 ENCOUNTER — Encounter (HOSPITAL_COMMUNITY): Payer: Self-pay | Admitting: Radiology

## 2015-10-05 ENCOUNTER — Other Ambulatory Visit (HOSPITAL_COMMUNITY): Payer: Self-pay

## 2015-10-05 DIAGNOSIS — M4640 Discitis, unspecified, site unspecified: Secondary | ICD-10-CM

## 2015-10-05 DIAGNOSIS — E876 Hypokalemia: Secondary | ICD-10-CM

## 2015-10-05 DIAGNOSIS — D72829 Elevated white blood cell count, unspecified: Secondary | ICD-10-CM

## 2015-10-05 LAB — CBC
HCT: 43.1 % (ref 36.0–46.0)
HEMOGLOBIN: 13.8 g/dL (ref 12.0–15.0)
MCH: 27.8 pg (ref 26.0–34.0)
MCHC: 32 g/dL (ref 30.0–36.0)
MCV: 86.9 fL (ref 78.0–100.0)
PLATELETS: 295 10*3/uL (ref 150–400)
RBC: 4.96 MIL/uL (ref 3.87–5.11)
RDW: 14.6 % (ref 11.5–15.5)
WBC: 12.9 10*3/uL — AB (ref 4.0–10.5)

## 2015-10-05 LAB — BASIC METABOLIC PANEL
ANION GAP: 11 (ref 5–15)
BUN: 14 mg/dL (ref 6–20)
CHLORIDE: 102 mmol/L (ref 101–111)
CO2: 24 mmol/L (ref 22–32)
Calcium: 9.3 mg/dL (ref 8.9–10.3)
Creatinine, Ser: 0.76 mg/dL (ref 0.44–1.00)
GFR calc Af Amer: >60 mL/min (ref >60)
Glucose, Bld: 135 mg/dL — ABNORMAL HIGH (ref 65–99)
POTASSIUM: 3.4 mmol/L — AB (ref 3.5–5.1)
SODIUM: 137 mmol/L (ref 135–145)

## 2015-10-05 LAB — HEMOGLOBIN A1C
Hgb A1c MFr Bld: 5.5 % (ref 4.8–5.6)
Mean Plasma Glucose: 111 mg/dL

## 2015-10-05 MED ORDER — LORAZEPAM 1 MG PO TABS
1.0000 mg | ORAL_TABLET | Freq: Four times a day (QID) | ORAL | Status: DC | PRN
  Administered 2015-10-05: 1 mg via ORAL
  Filled 2015-10-05: qty 1

## 2015-10-05 MED ORDER — LORAZEPAM 2 MG/ML IJ SOLN: 1.0000 mg | Freq: Four times a day (QID) | INTRAMUSCULAR | Status: DC | PRN

## 2015-10-05 MED ORDER — POTASSIUM CHLORIDE CRYS ER 20 MEQ PO TBCR: 40.0000 meq | EXTENDED_RELEASE_TABLET | Freq: Once | ORAL | Status: DC

## 2015-10-05 NOTE — Progress Notes (Addendum)
Patient ID: Alice PereyraDoris [name], female   DOB: Nov 09, 1965, 50 y.o.   MRN: [id number]  PROGRESS NOTE    Alice [name]  [id number] DOB: Nov 09, 1965 DOA: 10/03/2015  PCP: Bennie PieriniMary-Margaret Martin, FNP   Brief Narrative:  50 y.o. female with past medical history significant for HTN, polysubstance abuse, C6-7 discitis diagnosed in July.  She was supposed to be on six weeks of IM Rocephin (she declined placement for IV antibiotics), but she has been noncompliant so she was recently put on oral amoxicillin. [facility] speech, left face numbness and facial droop, left sided weakness in both upper and lower extremities.  She also had headache and double vision.   In ED, she was hemodynamically stable. Head CT was negative for acute CVA. The 12 lead EKG showed NSR with LVH and no acute ST segment changes.  WBC count was 13.9. UDS positive for opiates and cocaine. MRI showed acute right pontine infarcts.    Assessment & Plan:   Principal Problem:   Right pontine CVA (HCC) - Stroke work up initiated:  - Aspirin daily - MRI brain was limited due to motion artifact but it did show acute right pontine infarcts - Pt refused MRA - 2D ECHO - pending as of 9/24 - Carotid doppler - pending  - HgbA1c- 5.5 - LDL 115 - PT eval - CIR, order placed  Other Stroke Risk Factors : alcohol abuse, HTN - Appreciate neurology following   Active Problems:   TOBACCO ABUSE - Counseled on smoking cessation    Opiate dependence (HCC) /  Drug abuse - UDS positive for cocaine and opiates     AKI (acute kidney injury) (HCC) - Resolved with IV fluids     Hypokalemia - Supplemented - Follow up BMP tomorrow am     Dyslipidemia - Continue statin therapy    Benign essential HTN - Continue lisinopril    Discitis / Leukocytosis - Continue Rocephin  - Blood culture negative so far   DVT prophylaxis: SCD's bilaterally  Code Status: full code  Family Communication: husband at the bedside this  am Disposition Plan: Order placed for CIR evaluation    Consultants:   Neurology  PT  Procedures:   None   Antimicrobials:   Rocephin 10/03/2015 -->   Subjective: No overnight events.  Objective: Vitals:   10/04/15 2231 10/05/15 0120 10/05/15 0500 10/05/15 1039  BP: (!) 175/97 (!) 170/99 (!) 175/110 (!) 176/112  Pulse: 88 84 97 (!) 109  Resp: 18 18 (!) 22   Temp: 99 F (37.2 C) 99.3 F (37.4 C) 99 F (37.2 C) 99 F (37.2 C)  TempSrc: Oral Oral Oral Oral  SpO2: 100% 99% 99% 100%  Weight:      Height:        Intake/Output Summary (Last 24 hours) at 10/05/15 1419 Last data filed at 10/05/15 1300  Gross per 24 hour  Intake              650 ml  Output                0 ml  Net              650 ml   Filed Weights   10/03/15 2231  Weight: 56.8 kg (125 lb 3.5 oz)    Examination:  General exam: Appears calm and comfortable, no distress  Respiratory system: Clear to auscultation. No wheezing  Cardiovascular system: S1 & S2 heard, Rate controlled  Gastrointestinal system: (+)  BS, non tender  Central nervous system: Alert, oriented Extremities: No swelling, palpable pulses  Skin: Warm and dry  Psychiatry: Mood & affect appropriate.   Data Reviewed: I have personally reviewed following labs and imaging studies  CBC:  Recent Labs Lab 10/03/15 2003 10/04/15 0225 10/05/15 0342  WBC 13.9* 12.7* 12.9*  NEUTROABS 10.1* 8.8*  --   HGB 14.3 14.4 13.8  HCT 43.7 44.3 43.1  MCV 86.4 86.5 86.9  PLT 215 261 295   Basic Metabolic Panel:  Recent Labs Lab 10/03/15 2003 10/04/15 0225 10/05/15 0342  NA 138 142 137  K 3.2* 3.1* 3.4*  CL 99* 100* 102  CO2 31 29 24   GLUCOSE 99 112* 135*  BUN 16 14 14   CREATININE 1.03* 0.81 0.76  CALCIUM 9.4 9.7 9.3   GFR: Estimated Creatinine Clearance: 75.4 mL/min (by C-G formula based on SCr of 0.76 mg/dL). Liver Function Tests:  Recent Labs Lab 10/03/15 2003  AST 16  ALT 11*  ALKPHOS 76  BILITOT 0.5  PROT 8.0   ALBUMIN 3.7   No results for input(s): LIPASE, AMYLASE in the last 168 hours. No results for input(s): AMMONIA in the last 168 hours. Coagulation Profile:  Recent Labs Lab 10/03/15 2003  INR 1.11   Cardiac Enzymes: No results for input(s): CKTOTAL, CKMB, CKMBINDEX, TROPONINI in the last 168 hours. BNP (last 3 results) No results for input(s): PROBNP in the last 8760 hours. HbA1C:  Recent Labs  10/04/15 0225  HGBA1C 5.5   CBG: No results for input(s): GLUCAP in the last 168 hours. Lipid Profile:  Recent Labs  10/04/15 0225  CHOL 165  HDL 36*  LDLCALC 115*  TRIG 68  CHOLHDL 4.6   Thyroid Function Tests: No results for input(s): TSH, T4TOTAL, FREET4, T3FREE, THYROIDAB in the last 72 hours. Anemia Panel: No results for input(s): VITAMINB12, FOLATE, FERRITIN, TIBC, IRON, RETICCTPCT in the last 72 hours. Urine analysis:    Component Value Date/Time   COLORURINE YELLOW 10/03/2015 1941   APPEARANCEUR CLOUDY (A) 10/03/2015 1941   LABSPEC 1.010 10/03/2015 1941   PHURINE 5.5 10/03/2015 1941   GLUCOSEU NEGATIVE 10/03/2015 1941   HGBUR NEGATIVE 10/03/2015 1941   BILIRUBINUR NEGATIVE 10/03/2015 1941   BILIRUBINUR neg 10/30/2013 1427   KETONESUR NEGATIVE 10/03/2015 1941   PROTEINUR NEGATIVE 10/03/2015 1941   UROBILINOGEN negative 10/30/2013 1427   UROBILINOGEN 1.0 07/31/2011 1059   NITRITE NEGATIVE 10/03/2015 1941   LEUKOCYTESUR TRACE (A) 10/03/2015 1941   Sepsis Labs: @LABRCNTIP (procalcitonin:4,lacticidven:4)   ) Recent Results (from the past 240 hour(s))  Culture, blood (routine x 2)     Status: None (Preliminary result)   Collection Time: 10/03/15  8:00 PM  Result Value Ref Range Status   Specimen Description BLOOD RIGHT ANTECUBITAL  Final   Special Requests BOTTLES DRAWN AEROBIC AND ANAEROBIC 5 CC  Final   Culture NO GROWTH 2 DAYS  Final   Report Status PENDING  Incomplete  Culture, blood (routine x 2)     Status: None (Preliminary result)   Collection  Time: 10/03/15  8:03 PM  Result Value Ref Range Status   Specimen Description BLOOD RIGHT HAND  Final   Special Requests BOTTLES DRAWN AEROBIC AND ANAEROBIC 5 CC  Final   Culture NO GROWTH 2 DAYS  Final   Report Status PENDING  Incomplete      Radiology Studies: Ct Head Wo Contrast Result Date: 10/03/2015 No evidence of acute intracranial abnormality. Mild small vessel ischemic changes.  Mr Brain Ilda Basset  Contrast Result Date: 10/04/2015 imited motion degraded single sequence of the brain: Acute RIGHT pontine infarcts.   Scheduled Meds: . aspirin EC  81 mg Oral Daily  . atorvastatin  40 mg Oral q1800  . cefTRIAXone (ROCEPHIN)  IV  2 g Intravenous Q24H  . lisinopril  10 mg Oral Daily   Continuous Infusions:    LOS: 2 days    Time spent: 15 minutes  Greater than 50% of the time spent on counseling and coordinating the care.   Manson Passey, MD [facility] Pager [phone]  If 7PM-7AM, please contact night-coverage [email or web address] Password [email or web address] 10/05/2015, 2:19 PM

## 2015-10-05 NOTE — Progress Notes (Signed)
PAtient wants to leave AMA. MD paged. Neurologist at bedside and aware. Patient/family aware of risks. Family is to take pt home at this time.   Sim BoastHavy, RN

## 2015-10-05 NOTE — Progress Notes (Signed)
Pt moved from 5M11 to 5M01 for closer observation due to impulsive behavior of getting out of bed. Pt is unstable on feet and poses  A high risk of falls. Relocation explained to pt and family, questions answered. Will continue to monitor pt

## 2015-10-05 NOTE — Progress Notes (Signed)
Pt was off floor at CT

## 2015-10-05 NOTE — Progress Notes (Signed)
STROKE TEAM PROGRESS NOTE   HISTORY OF PRESENT ILLNESS (per record) Alice [name] is an 50 y.o. female with a history of a pretension, cervical discitis, heart murmur and IV drug use, presenting with complaint of weakness of left upper extremity as well as numbness and weakness of left face and upper extremity for 3 days. She has no previous history of stroke nor TIA. CT scan of her head showed no acute intracranial abnormality. Blood pressure was elevated at 150/100. Patient has been on antibiotic treatment as an outpatient for discitis. NIH stroke score the time of this evaluation was 4. She was last known well from a neurologic standpoint at home 09/30/2015. Urine drug screen was positive for opiates and cocaine.   SUBJECTIVE (INTERVAL HISTORY) She continues to have mild left-sided weakness, Patient wants to leave AMA OBJECTIVE Temp:  [99 F (37.2 C)-99.7 F (37.6 C)] 99 F (37.2 C) (09/24 1039) Pulse Rate:  [84-109] 109 (09/24 1039) Cardiac Rhythm: Supraventricular tachycardia (09/24 0834) Resp:  [18-22] 22 (09/24 0500) BP: (152-176)/(96-112) 176/112 (09/24 1039) SpO2:  [99 %-100 %] 100 % (09/24 1039)  CBC:   Recent Labs Lab 10/03/15 2003 10/04/15 0225 10/05/15 0342  WBC 13.9* 12.7* 12.9*  NEUTROABS 10.1* 8.8*  --   HGB 14.3 14.4 13.8  HCT 43.7 44.3 43.1  MCV 86.4 86.5 86.9  PLT 215 261 929    Basic Metabolic Panel:   Recent Labs Lab 10/04/15 0225 10/05/15 0342  NA 142 137  K 3.1* 3.4*  CL 100* 102  CO2 29 24  GLUCOSE 112* 135*  BUN 14 14  CREATININE 0.81 0.76  CALCIUM 9.7 9.3    Lipid Panel:     Component Value Date/Time   CHOL 165 10/04/2015 0225   TRIG 68 10/04/2015 0225   HDL 36 (L) 10/04/2015 0225   CHOLHDL 4.6 10/04/2015 0225   VLDL 14 10/04/2015 0225   LDLCALC 115 (H) 10/04/2015 0225   HgbA1c:  Lab Results  Component Value Date   HGBA1C 5.5 10/04/2015   Urine Drug Screen:     Component Value Date/Time   LABOPIA POSITIVE (A) 10/03/2015 1941    COCAINSCRNUR POSITIVE (A) 10/03/2015 1941   LABBENZ NONE DETECTED 10/03/2015 1941   AMPHETMU NONE DETECTED 10/03/2015 1941   THCU NONE DETECTED 10/03/2015 1941   LABBARB NONE DETECTED 10/03/2015 1941      IMAGING  Ct Head Wo Contrast 10/03/2015 No evidence of acute intracranial abnormality. Mild small vessel ischemic changes.     Mr Brain Wo Contrast 10/04/2015 Limited motion degraded single sequence of the brain: Acute RIGHT pontine infarcts.    CTA Head and Neck 10/05/2015  CT HEAD:   Evolving nonhemorrhagic small RIGHT pontine infarct. Otherwise negative CT HEAD.  CTA NECK:   Negative. 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 If clinical concern for residual infection, recommend follow-up ESR and CRP.  CTA HEAD:  No emergent large vessel occlusion or high-grade stenosis. Luminal irregularity of the intracranial vessels concerning for vasculopathy, less likely atherosclerosis.    PHYSICAL EXAM   Neurologic Examination: Mental Status: Currently she is alert and oriented 2  Speech moderately slurred without evidence of aphasia. Able to follow commands without difficulty. Cranial Nerves: II-Visual fields were normal. III/IV/VI-Pupils were equal and reacted normally to light. Extraocular movements were full and conjugate.    V/VII-reduced perception of tactile sensation over left side of the face compared to the right; no facial weakness. VIII-normal. X-moderate dysarthria; symmetrical palatal movement. XI:  trapezius strength/neck flexion strength normal bilaterally XII-midline tongue extension with normal strength. Motor: Mild drift of left upper extremity; motor exam otherwise unremarkable Sensory: Normal throughout. Deep Tendon Reflexes: 2+ and symmetric. Plantars: Flexor bilaterally Cerebellar: Abnormal coordination of upper extremities, left greater than right.    ASSESSMENT/PLAN [name] is a 50 y.o.  female with history of hypertension, heart murmur, chronic low back pain, current treatment for cervical discitis, anemia, and substance abuse.  presenting with left-sided weakness 3 days. She did not receive IV t-PA due to late presentation. Associated risk factors include hypertension history of cocaine abuse  Stroke:  Non-dominant  Infarct secondary to small vessel disease source.   MRI - Acute RIGHT pontine infarcts.   CTA H&N - see above  2D Echo  EF 65-70%. No cardiac source of emboli identified. Mild to moderate mitral regurgitation.  LDL - 115  HgbA1c - 5.5  VTE prophylaxis  - SCDs Diet Heart Room service appropriate? Yes; Fluid consistency: Thin  No antithrombotic prior to admission, now on aspirin 81 mg daily  Patient counseled to be compliant with her antithrombotic medications  Ongoing aggressive stroke risk factor management  Therapy recommendations: Inpatient rehabilitation (CIR) recommended.  Disposition: pending  Hypertension  Stable  Permissive hypertension (OK if < 220/120) but gradually normalize in 5-7 days  Long-term BP goal normotensive  Hyperlipidemia  Home meds:  No lipid lowering medications prior to admission  LDL 115, goal < 70  Add Lipitor 40 mg daily  Continue statin at discharge    Other Stroke Risk Factors  Cigarette smoker - advised to stop smoking  UDS - positive for cocaine  Other Active Problems  UDS - positive for cocaine and opiates  Hypokalemia - supplemented  Cervical discitis - IV Rocephin  History of IV drug use - ? TEE  C7 pathologic fracture - see CTA above.  Findings were discussed in detail with the patient and recommended transesophageal echocardiogram. Needs further treatment for her cervical discitis. Patient wants to go home and left AMA     To contact Stroke Continuity provider, please refer to [email or web address] After hours, contact General Neurology

## 2015-10-05 NOTE — Progress Notes (Addendum)
Patient voices that she wants to leave because "she is sick" and "want to shoot up". RN explained the risks and benefits of leaving AM and offer support. Pt refused TELE monitoring at this time. Fiance at bedside verbalizing that she is not going anywhere at this time. Pt is now calm and rest in bed. MD paged.  Sim BoastHavy, RN

## 2015-10-05 NOTE — Evaluation (Signed)
Physical Therapy Evaluation Patient Details Name: Alice [name] MRN: [id number] DOB: March 27, 1965 Today's Date: 10/05/2015   History of Present Illness  Pt adm with lt sided weakness and double vision. MRI showed acute right pontine infarcts. PMH - polysubstance abuse, HTN, c6-7 discitis.  Clinical Impression  Pt admitted with above diagnosis and presents to PT with functional limitations due to deficits listed below (See PT problem list). Pt needs skilled PT to maximize independence and safety to allow discharge to CIR. Pt with motor and cognitive deficits that require her to have assist to be safe with mobility.     Follow Up Recommendations CIR    Equipment Recommendations  Other (comment) (To be assessed)    Recommendations for Other Services       Precautions / Restrictions Precautions Precautions: Fall Restrictions Weight Bearing Restrictions: No      Mobility  Bed Mobility Overal bed mobility: Needs Assistance Bed Mobility: Rolling;Sidelying to Sit;Sit to Sidelying Rolling: Supervision Sidelying to sit: Supervision     Sit to sidelying: Supervision General bed mobility comments: incr time and effort. Supervision for safety  Transfers Overall transfer level: Needs assistance Equipment used: 1 person hand held assist Transfers: Sit to/from Stand Sit to Stand: Min assist         General transfer comment: Assist for balance  Ambulation/Gait Ambulation/Gait assistance: Min assist;Mod assist Ambulation Distance (Feet): 80 Feet Assistive device: 1 person hand held assist Gait Pattern/deviations: Step-through pattern;Decreased dorsiflexion - left;Steppage;Staggering left;Staggering right;Drifts right/left Gait velocity: decr Gait velocity interpretation: Below normal speed for age/gender General Gait Details: Assist for balance. Generally min assist but occasional mod assist to correct balance. Pt with high steppage gait on rt. Pt couldn't explain why she is picking  up that leg.  Stairs            Wheelchair Mobility    Modified Rankin (Stroke Patients Only) Modified Rankin (Stroke Patients Only) Pre-Morbid Rankin Score: No symptoms Modified Rankin: Moderately severe disability     Balance Overall balance assessment: Needs assistance Sitting-balance support: No upper extremity supported;Feet supported Sitting balance-Leahy Scale: Fair     Standing balance support: Single extremity supported Standing balance-Leahy Scale: Poor Standing balance comment: UE support                             Pertinent Vitals/Pain Pain Assessment: 0-10 Pain Score: 8  Pain Location: head Pain Descriptors / Indicators: Aching Pain Intervention(s): Limited activity within patient's tolerance;Monitored during session    Home Living Family/patient expects to be discharged to:: Private residence Living Arrangements: Spouse/significant other Available Help at Discharge: Family;Available 24 hours/day   Home Access: Level entry     Home Layout: One level Home Equipment: None      Prior Function Level of Independence: Independent               Hand Dominance        Extremity/Trunk Assessment   Upper Extremity Assessment: Defer to OT evaluation           Lower Extremity Assessment: LLE deficits/detail   LLE Deficits / Details: grossly 4/5 strength     Communication   Communication: Other (comment) (slurred speech)  Cognition Arousal/Alertness:  (sleepy but arousable) Behavior During Therapy: Flat affect;Impulsive Overall Cognitive Status: Impaired/Different from baseline Area of Impairment: Safety/judgement;Problem solving         Safety/Judgement: Decreased awareness of safety;Decreased awareness of deficits   Problem Solving: Requires verbal cues;Slow  processing      General Comments      Exercises     Assessment/Plan    PT Assessment Patient needs continued PT services  PT Problem List Decreased  strength;Decreased balance;Decreased mobility;Decreased coordination;Decreased cognition;Decreased knowledge of use of DME;Decreased safety awareness          PT Treatment Interventions DME instruction;Gait training;Stair training;Functional mobility training;Therapeutic activities;Therapeutic exercise;Balance training;Neuromuscular re-education;Cognitive remediation;Patient/family education    PT Goals (Current goals can be found in the Care Plan section)  Acute Rehab PT Goals Patient Stated Goal: Pt wants to go home PT Goal Formulation: With patient Time For Goal Achievement: 10/12/15 Potential to Achieve Goals: Good    Frequency Min 4X/week   Barriers to discharge        Co-evaluation               End of Session Equipment Utilized During Treatment: Gait belt Activity Tolerance: Patient tolerated treatment well Patient left: in bed;with call bell/phone within reach;with bed alarm set;with family/visitor present Nurse Communication: Mobility status         Time: [id number] PT Time Calculation (min) (ACUTE ONLY): 12 min   Charges:   PT Evaluation $PT Eval Moderate Complexity: 1 Procedure     PT G Codes:        Remmy Riffe 10/05/2015, 9:16 AM [facility] [phone]

## 2015-10-06 NOTE — Discharge Summary (Signed)
Physician Discharge Summary  Alice [name] ZOX:[id number] DOB: 05-01-1965 DOA: 10/03/2015  PCP: Chevis Pretty, FNP  Admit date: 10/03/2015 Discharge date: 10/06/2015  Recommendations for Outpatient Follow-up:  1. 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 She was supposed to be on six weeks of IM Rocephin (she declined placement for IV antibiotics), but she has been noncompliant so she was recently put on oral amoxicillin. She presented to [facility] due to slurred speech, left face numbness and facial droop, left sided weakness in both upper and lower extremities. She also had headache and double vision.  In ED, she was hemodynamically stable. Head CT was negative for acute CVA. The 12 lead EKG showed NSR with LVH and no acute ST segment changes. WBC count was 13.9.UDS positive for opiates and cocaine. MRI showed acute right pontine infarcts.    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[place]) - Stroke work up initiated:  - Aspirin daily - MRI brain was limited due to motion artifact but it did show acute right pontine infarcts - Pt refused MRA - 2D ECHO - pending as of 9/24 - Carotid doppler - pending  - HgbA1c- 5.5 - LDL 115 - PT eval - CIR, order placed  Other Stroke Risk Factors : alcohol abuse, HTN - Appreciate neurology following   Active Problems:   TOBACCO ABUSE - Counseled on smoking cessation    Opiate dependence ([place]) /  Drug abuse - UDS positive for cocaine and opiates     AKI (acute kidney injury) ([place]) -  Resolved with IV fluids     Hypokalemia - Supplemented    Dyslipidemia - Continue statin therapy    Benign essential HTN - Continue lisinopril    Discitis / Leukocytosis - Blood culture negative so far  - left AMA  DVT prophylaxis: SCD's bilaterally  Code Status: full code  Family Communication: husband at the bedside this am    Consultants:   Neurology  PT  Procedures:   None   Antimicrobials:   Rocephin 10/03/2015 --> 10/05/2015   Signed:  Leisa Lenz, MD  [facility] 10/06/2015, 11:06 AM  Pager #: [phone]    Discharge Exam: Vitals:   10/05/15 0500 10/05/15 1039  BP: (!) 175/110 (!) 176/112  Pulse: 97 (!) 109  Resp: (!) 22   Temp: 99 F (37.2 C) 99 F (37.2 C)   Vitals:   10/04/15 2231 10/05/15 0120 10/05/15 0500 10/05/15 1039  BP: (!) 175/97 (!) 170/99 (!) 175/110 (!) 176/112  Pulse: 88 84 97 (!) 109  Resp: 18 18 (!) 22   Temp: 99 F (37.2 C) 99.3 F (37.4 C) 99 F (37.2 C) 99 F (37.2 C)  TempSrc: Oral Oral Oral Oral  SpO2: 100% 99% 99% 100%  Weight:      Height:        Pt left AMA, PE done in am  Discharge Instructions     Medication List    ASK your doctor about these medications   albuterol 108 (  90 Base) MCG/ACT inhaler Commonly known as:  PROVENTIL HFA;VENTOLIN HFA Inhale 2 puffs into the lungs every 6 (six) hours as needed for wheezing or shortness of breath.   amoxicillin 500 MG tablet Commonly known as:  AMOXIL Take 1 tablet (500 mg total) by mouth 3 (three) times daily.   ibuprofen 200 MG tablet Commonly known as:  ADVIL,MOTRIN Take 400 mg by mouth every 6 (six) hours as needed for moderate pain.   lisinopril 5 MG tablet Commonly known as:  PRINIVIL,ZESTRIL Take 10 mg by mouth daily.         The results of significant diagnostics from this hospitalization (including imaging, microbiology, ancillary and laboratory) are listed below for reference.    Significant Diagnostic Studies: Ct  Angio Head W Or Wo Contrast  Result Date: 10/05/2015 CLINICAL DATA:  Followup stroke. 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 EXAM: CT ANGIOGRAPHY HEAD AND NECK TECHNIQUE: Multidetector CT imaging of the head and neck was performed using the standard protocol during bolus administration of intravenous contrast. Multiplanar CT image reconstructions and MIPs were obtained to evaluate the vascular anatomy. Carotid stenosis measurements (when applicable) are obtained utilizing NASCET criteria, using the distal internal carotid diameter as the denominator. CONTRAST:  50 cc Isovue 370 COMPARISON:  MRI of the brain October 04, 2015 and CT HEAD October 03, 2015 FINDINGS: CT HEAD BRAIN: The ventricles and sulci are normal. No intraparenchymal hemorrhage, mass effect nor midline shift. No acute large vascular territory infarcts. Patchy hypodensities RIGHT posterior pons corresponding to known acute infarcts. No abnormal extra-axial fluid collections. Basal cisterns are patent. VASCULAR: Unremarkable. SKULL/SOFT TISSUES: No skull fracture. No significant soft tissue swelling. ORBITS/SINUSES: The included ocular globes and orbital contents are normal.The mastoid aircells and included paranasal sinuses are well-aerated. OTHER: Patient is edentulous. CTA NECK AORTIC ARCH: Normal appearance of the thoracic arch, normal branch pattern. Trace calcific atherosclerosis. The origins of the innominate, left Common carotid artery and subclavian artery are widely patent. RIGHT CAROTID SYSTEM: Common carotid artery is widely patent, coursing in a straight line fashion. Normal appearance of the carotid bifurcation without hemodynamically significant stenosis by NASCET criteria. Trace eccentric calcific atherosclerosis carotid bifurcation. Normal appearance of the included internal carotid artery. LEFT CAROTID SYSTEM: Common carotid artery is widely patent, coursing in a straight line fashion. Normal  appearance of the carotid bifurcation without hemodynamically significant stenosis by NASCET criteria. Trace eccentric calcific atherosclerosis carotid bifurcation. Normal appearance of the included internal carotid artery. VERTEBRAL ARTERIES:Left vertebral artery is dominant. Normal appearance of the vertebral arteries, which appear widely patent. SKELETON: Destructive bony change at C6-7 with Old C7 pathologic fracture, progressed height loss from prior CT, now approximately 50% height loss. Endplate sclerosis and irregularity resulting in mild canal stenosis. OTHER NECK: Soft tissues of the neck are non-acute though, not tailored for evaluation. CTA HEAD ANTERIOR CIRCULATION: Normal appearance of the cervical internal carotid arteries, petrous, cavernous and supra clinoid internal carotid arteries. Widely patent anterior communicating artery. Patent anterior and middle cerebral arteries. Moderate proximal to mid anterior middle cerebral artery luminal irregularity. No large vessel occlusion, hemodynamically significant stenosis, dissection, contrast extravasation or aneurysm. POSTERIOR CIRCULATION: Normal appearance of the vertebral arteries, vertebrobasilar junction and basilar artery, as well as main branch vessels. Patent posterior cerebral arteries. Bilateral posterior communicating arteries present. Mild proximal to mid posterior cerebral arteries luminal regularity. No large vessel occlusion, hemodynamically significant stenosis, dissection, contrast extravasation or aneurysm. VENOUS SINUSES: Major dural venous sinuses are patent though not  tailored for evaluation on this angiographic examination. ANATOMIC VARIANTS: None. DELAYED PHASE: Not performed, patient did not wish to undergo further imaging. IMPRESSION: CT HEAD:  Evolving nonhemorrhagic small RIGHT pontine infarct. Otherwise negative CT HEAD. CTA NECK:  Negative. 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 If  clinical concern for residual infection, recommend follow-up ESR and CRP. CTA HEAD: No emergent large vessel occlusion or high-grade stenosis. Luminal irregularity of the intracranial vessels concerning for vasculopathy, less likely atherosclerosis. Electronically Signed   By: Elon Alas M.D.   On: 10/05/2015 02:48   Dg Chest 2 View  Result Date: 09/18/2015 CLINICAL DATA:  Chest congestion EXAM: CHEST  2 VIEW COMPARISON:  07/29/2015 FINDINGS: Normal heart, mediastinum and hila. Lungs are clear.  No pleural effusion or pneumothorax. Skeletal structures are unremarkable. IMPRESSION: No active cardiopulmonary disease. Electronically Signed   By: Lajean Manes M.D.   On: 09/18/2015 14:13   Ct Head Wo Contrast  Result Date: 10/03/2015 CLINICAL DATA:  Slurred speech EXAM: CT HEAD WITHOUT CONTRAST TECHNIQUE: Contiguous axial images were obtained from the base of the skull through the vertex without intravenous contrast. COMPARISON:  MRI brain dated 09/22/2015 FINDINGS: Brain: No evidence of acute infarction, hemorrhage, hydrocephalus, extra-axial collection or mass lesion/mass effect. Vascular: No hyperdense vessel or unexpected calcification. Skull: Normal. Negative for fracture or focal lesion. Sinuses/Orbits: No acute finding. Other: Subcortical white matter and periventricular small vessel ischemic changes. IMPRESSION: No evidence of acute intracranial abnormality. Mild small vessel ischemic changes. Electronically Signed   By: Julian Hy M.D.   On: 10/03/2015 19:20   Ct Angio Neck W Or Wo Contrast  Result Date: 10/05/2015 CLINICAL DATA:  Followup stroke. 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 EXAM: CT ANGIOGRAPHY HEAD AND NECK TECHNIQUE: Multidetector CT imaging of the head and neck was performed using the standard protocol during bolus administration of intravenous contrast. Multiplanar CT image reconstructions and MIPs were obtained to evaluate the vascular  anatomy. Carotid stenosis measurements (when applicable) are obtained utilizing NASCET criteria, using the distal internal carotid diameter as the denominator. CONTRAST:  50 cc Isovue 370 COMPARISON:  MRI of the brain October 04, 2015 and CT HEAD October 03, 2015 FINDINGS: CT HEAD BRAIN: The ventricles and sulci are normal. No intraparenchymal hemorrhage, mass effect nor midline shift. No acute large vascular territory infarcts. Patchy hypodensities RIGHT posterior pons corresponding to known acute infarcts. No abnormal extra-axial fluid collections. Basal cisterns are patent. VASCULAR: Unremarkable. SKULL/SOFT TISSUES: No skull fracture. No significant soft tissue swelling. ORBITS/SINUSES: The included ocular globes and orbital contents are normal.The mastoid aircells and included paranasal sinuses are well-aerated. OTHER: Patient is edentulous. CTA NECK AORTIC ARCH: Normal appearance of the thoracic arch, normal branch pattern. Trace calcific atherosclerosis. The origins of the innominate, left Common carotid artery and subclavian artery are widely patent. RIGHT CAROTID SYSTEM: Common carotid artery is widely patent, coursing in a straight line fashion. Normal appearance of the carotid bifurcation without hemodynamically significant stenosis by NASCET criteria. Trace eccentric calcific atherosclerosis carotid bifurcation. Normal appearance of the included internal carotid artery. LEFT CAROTID SYSTEM: Common carotid artery is widely patent, coursing in a straight line fashion. Normal appearance of the carotid bifurcation without hemodynamically significant stenosis by NASCET criteria. Trace eccentric calcific atherosclerosis carotid bifurcation. Normal appearance of the included internal carotid artery. VERTEBRAL ARTERIES:Left vertebral artery is dominant. Normal appearance of the vertebral arteries, which appear widely patent. SKELETON: Destructive bony change at C6-7 with Old C7 pathologic  fracture,  progressed height loss from prior CT, now approximately 50% height loss. Endplate sclerosis and irregularity resulting in mild canal stenosis. OTHER NECK: Soft tissues of the neck are non-acute though, not tailored for evaluation. CTA HEAD ANTERIOR CIRCULATION: Normal appearance of the cervical internal carotid arteries, petrous, cavernous and supra clinoid internal carotid arteries. Widely patent anterior communicating artery. Patent anterior and middle cerebral arteries. Moderate proximal to mid anterior middle cerebral artery luminal irregularity. No large vessel occlusion, hemodynamically significant stenosis, dissection, contrast extravasation or aneurysm. POSTERIOR CIRCULATION: Normal appearance of the vertebral arteries, vertebrobasilar junction and basilar artery, as well as main branch vessels. Patent posterior cerebral arteries. Bilateral posterior communicating arteries present. Mild proximal to mid posterior cerebral arteries luminal regularity. No large vessel occlusion, hemodynamically significant stenosis, dissection, contrast extravasation or aneurysm. VENOUS SINUSES: Major dural venous sinuses are patent though not tailored for evaluation on this angiographic examination. ANATOMIC VARIANTS: None. DELAYED PHASE: Not performed, patient did not wish to undergo further imaging. IMPRESSION: CT HEAD:  Evolving nonhemorrhagic small RIGHT pontine infarct. Otherwise negative CT HEAD. CTA NECK:  Negative. 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 If clinical concern for residual infection, recommend follow-up ESR and CRP. CTA HEAD: No emergent large vessel occlusion or high-grade stenosis. Luminal irregularity of the intracranial vessels concerning for vasculopathy, less likely atherosclerosis. Electronically Signed   By: Elon Alas M.D.   On: 10/05/2015 02:48   Mr Brain Wo Contrast  Result Date: 10/04/2015 CLINICAL DATA:  LEFT facial droop, LEFT-sided  weakness and slurred speech for 3 days. History of hypertension, polysubstance abuse, discitis. EXAM: MRI HEAD WITHOUT CONTRAST TECHNIQUE: Axial diffusion weighted sequence. Patient did not wish to complete examination. COMPARISON:  CT HEAD October 03, 2015 an MRI of the head September 22, 2015 FINDINGS: Moderately motion degraded examination. Patchy reduced diffusion RIGHT pons, with low ADC values. No midline shift, mass effect. No hydrocephalus. IMPRESSION: Limited motion degraded single sequence of the brain: Acute RIGHT pontine infarcts. Electronically Signed   By: Elon Alas M.D.   On: 10/04/2015 06:58    Microbiology: Recent Results (from the past 240 hour(s))  Culture, blood (routine x 2)     Status: None (Preliminary result)   Collection Time: 10/03/15  8:00 PM  Result Value Ref Range Status   Specimen Description BLOOD RIGHT ANTECUBITAL  Final   Special Requests BOTTLES DRAWN AEROBIC AND ANAEROBIC 5 CC  Final   Culture NO GROWTH 2 DAYS  Final   Report Status PENDING  Incomplete  Culture, blood (routine x 2)     Status: None (Preliminary result)   Collection Time: 10/03/15  8:03 PM  Result Value Ref Range Status   Specimen Description BLOOD RIGHT HAND  Final   Special Requests BOTTLES DRAWN AEROBIC AND ANAEROBIC 5 CC  Final   Culture NO GROWTH 2 DAYS  Final   Report Status PENDING  Incomplete     Labs: Basic Metabolic Panel:  Recent Labs Lab 10/03/15 2003 10/04/15 0225 10/05/15 0342  NA 138 142 137  K 3.2* 3.1* 3.4*  CL 99* 100* 102  CO2 31 29 24   GLUCOSE 99 112* 135*  BUN 16 14 14   CREATININE 1.03* 0.81 0.76  CALCIUM 9.4 9.7 9.3   Liver Function Tests:  Recent Labs Lab 10/03/15 2003  AST 16  ALT 11*  ALKPHOS 76  BILITOT 0.5  PROT 8.0  ALBUMIN 3.7   No results for input(s): LIPASE, AMYLASE in the last 168 hours. No  results for input(s): AMMONIA in the last 168 hours. CBC:  Recent Labs Lab 10/03/15 2003 10/04/15 0225 10/05/15 0342  WBC 13.9*  12.7* 12.9*  NEUTROABS 10.1* 8.8*  --   HGB 14.3 14.4 13.8  HCT 43.7 44.3 43.1  MCV 86.4 86.5 86.9  PLT 215 261 295   Cardiac Enzymes: No results for input(s): CKTOTAL, CKMB, CKMBINDEX, TROPONINI in the last 168 hours. BNP: BNP (last 3 results) No results for input(s): BNP in the last 8760 hours.  ProBNP (last 3 results) No results for input(s): PROBNP in the last 8760 hours.  CBG: No results for input(s): GLUCAP in the last 168 hours.

## 2015-10-08 LAB — CULTURE, BLOOD (ROUTINE X 2)
CULTURE: NO GROWTH
Culture: NO GROWTH

## 2015-10-15 ENCOUNTER — Ambulatory Visit (INDEPENDENT_AMBULATORY_CARE_PROVIDER_SITE_OTHER): Payer: Medicaid Other | Admitting: Family Medicine

## 2015-10-15 ENCOUNTER — Encounter: Payer: Self-pay | Admitting: Family Medicine

## 2015-10-15 VITALS — BP 97/61 | HR 71 | Temp 99.1°F

## 2015-10-15 DIAGNOSIS — I6930 Unspecified sequelae of cerebral infarction: Secondary | ICD-10-CM | POA: Diagnosis not present

## 2015-10-15 DIAGNOSIS — N179 Acute kidney failure, unspecified: Secondary | ICD-10-CM | POA: Diagnosis not present

## 2015-10-15 DIAGNOSIS — I10 Essential (primary) hypertension: Secondary | ICD-10-CM

## 2015-10-15 MED ORDER — LISINOPRIL 5 MG PO TABS: 10.0000 mg | ORAL_TABLET | Freq: Every day | ORAL | 1 refills | Status: DC

## 2015-10-15 NOTE — Progress Notes (Signed)
BP 97/61   Pulse 71   Temp 99.1 F (37.3 C) (Oral)   LMP 07/30/2011    Subjective:    Patient ID: Alice [name], female    DOB: 1965-10-19, 50 y.o.   MRN: [id number]  HPI: Alice [name] is a 50 y.o. female presenting on 10/15/2015 for Hospital followup (refill Lisinopril - patient reports she has been taking 5 mg in the morning and 10 mg at night - discharge summary does not note such)   HPI Stroke and history of present illness and hospital follow-up Patient is coming in today for a stroke and hospital follow-up. She was in the hospital on 10/03/2015 and discharged AMA on 10/06/2015. She entered the hospital for speech difficulties and weakness and facial droop and left face numbness and left arm and leg weakness. She had an MRI and CTA which showed a right pontine CVA the CTA showed that her carotid arteries were clear but she left AMA before they could get an echocardiogram. She says that she did pass the speech swallow study but there is some concern about that. She comes into the office today complaining of still having speech difficulties and troubles processing her words. She also has difficulties with her arm and leg which make it unsafe for ambulation and she is here in a wheelchair today. She denies any new changes since she left the hospital. They did not have a chance to set her up with physical therapy because she left AMA. He also did not have a chance to do an echocardiogram because she left AMA. She is not taking an aspirin daily but has been recommended to do so. While she was in the hospital she also had an acute kidney injury which seemed to resolve by the end of her hospitalization and she also had hypertension which also seemed to be resolved and controlled by the time she left the hospital. She does have a headache today but no changes in vision or taste or smell from when she left the hospital. No change in numbness or weakness since she left the hospital either.  Relevant past  medical, surgical, family and social history reviewed and updated as indicated. Interim medical history since our last visit reviewed. Allergies and medications reviewed and updated.  Review of Systems  Constitutional: Negative for chills and fever.  Eyes: Negative for redness and visual disturbance.  Respiratory: Negative for chest tightness and shortness of breath.   Cardiovascular: Negative for chest pain and leg swelling.  Genitourinary: Negative for difficulty urinating and dysuria.  Musculoskeletal: Positive for back pain (Chronic discitis). Negative for gait problem.  Skin: Negative for rash.  Neurological: Positive for speech difficulty, weakness, numbness and headaches. Negative for dizziness and light-headedness.  Psychiatric/Behavioral: Negative for agitation and behavioral problems.  All other systems reviewed and are negative.   Per HPI unless specifically indicated above     Medication List       Accurate as of 10/15/15  1:51 PM. Always use your most recent med list.          acetaminophen 325 MG tablet Commonly known as:  TYLENOL Take 650 mg by mouth every 6 (six) hours as needed.   albuterol 108 (90 Base) MCG/ACT inhaler Commonly known as:  PROVENTIL HFA;VENTOLIN HFA Inhale 2 puffs into the lungs every 6 (six) hours as needed for wheezing or shortness of breath.   amoxicillin 500 MG tablet Commonly known as:  AMOXIL Take 1 tablet (500 mg total) by mouth 3 (  three) times daily.   ibuprofen 200 MG tablet Commonly known as:  ADVIL,MOTRIN Take 400 mg by mouth every 6 (six) hours as needed for moderate pain.   lisinopril 5 MG tablet Commonly known as:  PRINIVIL,ZESTRIL Take 2 tablets (10 mg total) by mouth daily.          Objective:    BP 97/61   Pulse 71   Temp 99.1 F (37.3 C) (Oral)   LMP 07/30/2011   Wt Readings from Last 3 Encounters:  10/03/15 125 lb 3.5 oz (56.8 kg)  09/18/15 132 lb 9.6 oz (60.1 kg)  08/29/15 132 lb (59.9 kg)    Physical  Exam  Constitutional: She is oriented to person, place, and time. She appears well-developed. No distress.  Eyes: Conjunctivae and EOM are normal. Pupils are equal, round, and reactive to light.  Cardiovascular: Normal rate, regular rhythm, normal heart sounds and intact distal pulses.   No murmur heard. Pulmonary/Chest: Effort normal and breath sounds normal. No respiratory distress. She has no wheezes.  Musculoskeletal: Normal range of motion. She exhibits no edema or tenderness.  Neurological: She is alert and oriented to person, place, and time. She has normal strength. A cranial nerve deficit (Right-sided facial droop) and sensory deficit (Right-sided sensory deficit per patient) is present. She exhibits abnormal muscle tone (Difficult to assess which side is weak based on exam). Coordination normal.  Skin: Skin is warm and dry. No rash noted. She is not diaphoretic.  Psychiatric: She has a normal mood and affect. Her behavior is normal.  Nursing note and vitals reviewed.     Assessment & Plan:   Problem List Items Addressed This Visit      Cardiovascular and Mediastinum   Benign essential HTN - Primary (Chronic)   Relevant Medications   lisinopril (PRINIVIL,ZESTRIL) 5 MG tablet     Genitourinary   AKI (acute kidney injury) (HCC)     Other   Late effects of cerebral ischemic stroke   Relevant Orders   ECHOCARDIOGRAM COMPLETE   Face-to-face encounter (required for Medicare/Medicaid patients)    Other Visit Diagnoses   None.      Follow up plan: Return in about 1 week (around 10/22/2015), or if symptoms worsen or fail to improve, for Follow-up with Dr. Ermalinda Memos as that is who she has listed as her PCP.  Counseling provided for all of the vaccine components No orders of the defined types were placed in this encounter.   Arville Care, MD [facility] 10/15/2015, 1:51 PM

## 2015-10-16 ENCOUNTER — Other Ambulatory Visit: Payer: Self-pay

## 2015-10-16 NOTE — Telephone Encounter (Signed)
Patient was seen 10/4n for hospital follow up this encounter will be closed.

## 2015-10-23 ENCOUNTER — Ambulatory Visit: Payer: Medicaid Other | Admitting: Family Medicine

## 2015-10-24 ENCOUNTER — Ambulatory Visit (HOSPITAL_COMMUNITY): Admission: RE | Admit: 2015-10-24 | Payer: Medicaid Other | Source: Ambulatory Visit

## 2015-10-27 ENCOUNTER — Ambulatory Visit: Payer: Medicaid Other | Admitting: Family Medicine

## 2015-10-31 ENCOUNTER — Ambulatory Visit (INDEPENDENT_AMBULATORY_CARE_PROVIDER_SITE_OTHER): Payer: Medicaid Other | Admitting: Family Medicine

## 2015-10-31 ENCOUNTER — Encounter: Payer: Self-pay | Admitting: Family Medicine

## 2015-10-31 VITALS — BP 144/90 | HR 71 | Temp 97.1°F | Ht 73.83 in | Wt 129.8 lb

## 2015-10-31 DIAGNOSIS — J449 Chronic obstructive pulmonary disease, unspecified: Secondary | ICD-10-CM | POA: Insufficient documentation

## 2015-10-31 DIAGNOSIS — E785 Hyperlipidemia, unspecified: Secondary | ICD-10-CM | POA: Diagnosis not present

## 2015-10-31 DIAGNOSIS — I6930 Unspecified sequelae of cerebral infarction: Secondary | ICD-10-CM

## 2015-10-31 DIAGNOSIS — J441 Chronic obstructive pulmonary disease with (acute) exacerbation: Secondary | ICD-10-CM | POA: Insufficient documentation

## 2015-10-31 DIAGNOSIS — Z8673 Personal history of transient ischemic attack (TIA), and cerebral infarction without residual deficits: Secondary | ICD-10-CM

## 2015-10-31 DIAGNOSIS — I10 Essential (primary) hypertension: Secondary | ICD-10-CM | POA: Diagnosis not present

## 2015-10-31 MED ORDER — ALBUTEROL SULFATE HFA 108 (90 BASE) MCG/ACT IN AERS: 2.0000 | INHALATION_SPRAY | Freq: Four times a day (QID) | RESPIRATORY_TRACT | 0 refills | Status: DC | PRN

## 2015-10-31 MED ORDER — TIOTROPIUM BROMIDE MONOHYDRATE 18 MCG IN CAPS: 18.0000 ug | ORAL_CAPSULE | Freq: Every day | RESPIRATORY_TRACT | 12 refills | Status: DC

## 2015-10-31 MED ORDER — ATORVASTATIN CALCIUM 20 MG PO TABS: 20.0000 mg | ORAL_TABLET | Freq: Every day | ORAL | 3 refills | Status: DC

## 2015-10-31 NOTE — Progress Notes (Signed)
 HPI  Patient presents today here for follow-up after stroke.  Patient continuesand right-sided weakness in her arms and legs. She has difficulty walking, she also has dysarthria.  She reports good amoxicillin continues to have neck pain.  She has not had the blood pressure medicines for about 2 weeks due to not having the money to buy them.  She does not have home health PT   PMH: Smoking status noted ROS: Per HPI  Objective: BP (!) 144/90   Pulse 71   Temp 97.1 F (36.2 C) (Oral)   Ht 6' 1.83" (1.875 m)   Wt 129 lb 12.8 oz (58.9 kg)   LMP 07/30/2011   BMI 16.74 kg/m  Gen: NAD, alert, cooperative with exam HEENT: NCAT CV: RRR, good S1/S2, no murmur Resp: CTABL, no wheezes, non-labored Ext: No edema, warm Neuro: Alert and oriented, broad based gait, Strngth 4/5 symmetric, hypertonia of th eR hand Dysarthria and slurred speech  Assessment and plan:  # late effects of CVA Improving symptoms per patient Difficulty walking or leaving home without assistance-  HH PT LDL gaol less than 70- start statin Neuro f/u   # HTN Discussed taking lisinopril, encouraged her to put a priority on it.   # dyslipidemia LDL goal less than 70, start statin Repeat labs in 6 weeks  # COPD Reassuring lung exam PT describes Hx of Dx and requests spiriva and albuterol which were Rxd Encouraged her to quit smoking.   Continues to ask for pain meds, given her Hx I do not think it is safe to prescribe controlled substances for her.  She admits to smoking crack today. I did offer IP rehab which she declines.    Orders Placed This Encounter  Procedures  . CMP14+EGFR  . CBC with Differential  . Ambulatory referral to Neurology    Referral Priority:   Routine    Referral Type:   Consultation    Referral Reason:   Specialty Services Required    Requested Specialty:   Neurology    Number of Visits Requested:   1  . Home Health    Order Specific Question:   To provide the  following care/treatments    Answer:   PT    Order Specific Question:   To provide the following care/treatments    Answer:   RN    Order Specific Question:   To provide the following care/treatments    Answer:   Home Health Aide  . Face-to-face encounter (required for Medicare/Medicaid patients)    I Samuel Bradshaw certify that this patient is under my care and that I, or a nurse practitioner or physician's assistant working with me, had a face-to-face encounter that meets the physician face-to-face encounter requirements with this patient on 10/31/2015. The encounter with the patient was in whole, or in part for the following medical condition(s) which is the primary reason for home health care (List medical condition): Late effects of CVA, HTN, R sided weakness, HLD    Order Specific Question:   The encounter with the patient was in whole, or in part, for the following medical condition, which is the primary reason for home health care    Answer:   LAte effects of CVA, HTN, R sided weakness, HLD    Order Specific Question:   I certify that, based on my findings, the following services are medically necessary home health services    Answer:   Nursing    Order Specific Question:   I   certify that, based on my findings, the following services are medically necessary home health services    Answer:   Physical therapy    Order Specific Question:   Reason for Medically Necessary Home Health Services    Answer:   Therapy- Gait Training, Transfer Training and Stair Training    Order Specific Question:   My clinical findings support the need for the above services    Answer:   Unable to leave home safely without assistance and/or assistive device    Order Specific Question:   Further, I certify that my clinical findings support that this patient is homebound due to:    Answer:   Unable to leave home safely without assistance    Meds ordered this encounter  Medications  . atorvastatin (LIPITOR) 20  MG tablet    Sig: Take 1 tablet (20 mg total) by mouth daily.    Dispense:  90 tablet    Refill:  3  . albuterol (PROVENTIL HFA;VENTOLIN HFA) 108 (90 Base) MCG/ACT inhaler    Sig: Inhale 2 puffs into the lungs every 6 (six) hours as needed for wheezing or shortness of breath.    Dispense:  1 Inhaler    Refill:  0  . tiotropium (SPIRIVA HANDIHALER) 18 MCG inhalation capsule    Sig: Place 1 capsule (18 mcg total) into inhaler and inhale daily.    Dispense:  30 capsule    Refill:  12    Sam Bradshaw, MD [facility] 10/31/2015, 5:18 PM     

## 2015-10-31 NOTE — Patient Instructions (Signed)
Great to see you!  Please start lipitor one pil once daily  You will be called to see a neurologist for a stroke follow up  Please coem back in 4-6 weeks for follow up of your cholesterol

## 2015-12-02 ENCOUNTER — Telehealth: Payer: Self-pay | Admitting: Family Medicine

## 2015-12-02 NOTE — Telephone Encounter (Signed)
Spoke with probation officer, Vivi BarrackKerri Kessler, correct phone number [phone].  Explained to her that patient had signed HIPPA form with her name on it giving us permission to speak with her, but that patient had told us she was told by the probation officer she had to put her on the HIPPA form.  Explained to her I had spoken with our Engineer, manufacturingpractice manager who recommended we contact her and find out what she was wanting to speak with us about.  Ms. Haynes BastKessler said she had not told the patient she must be on her HIPPA form. She said normally her clients are required to come in once per month to see her and she is required once per month to see them at home.  She states that for the last few months the patient has been telling her she has some health problems and has not been able to come in to the probation office, so the officer has been seeing her at her home instead.  She had requested the patient produce paperwork to verify the office visits but so far the patient has not done so.  She saw the patient this past Sunday, 11/30/15, and again asked for proof of doctor's visits which she could not do.  The probation officer then asked the patient if she wanted to put her on the HIPPA form so she could call us instead to verify information.  Ms. Haynes BastKessler, said the reason for her call was to verify that the patient was being truthful but also because she is concerned about her living situation.  I told her that I would pass along this information to our Engineer, manufacturingpractice manager and find out if legally this is something we could disclose to her and we would return her call either way.  I contacted the patient to ask for permission to speak with her probation officer giving only the dates of her visit and the reason she was seen, not disclosing any other information.  I received the patient's voicemail and left a message for her to call back.

## 2015-12-02 NOTE — Telephone Encounter (Signed)
Dr. Ermalinda MemosBradshaw, i'll be glad to call and speak with the probation officer about this patient if you like me to or if you would prefer to that's okay too.   Just let me know.  I can call and find out what type of information they are looking for if you'd like me to.

## 2015-12-02 NOTE — Telephone Encounter (Signed)
I'm not sure the best course of action in this case.   This does not seem consistent with what the patient would wish.   Will ask nursing to discuss with patient.    Murtis SinkSam Nafis Farnan, MD [facility] 12/02/2015, 12:49 PM

## 2015-12-27 NOTE — Telephone Encounter (Signed)
Unable to reach patient. Encounter will be closed.  

## 2016-01-08 ENCOUNTER — Encounter: Payer: Self-pay | Admitting: Pediatrics

## 2016-01-08 ENCOUNTER — Ambulatory Visit (INDEPENDENT_AMBULATORY_CARE_PROVIDER_SITE_OTHER): Payer: Medicaid Other | Admitting: Pediatrics

## 2016-01-08 ENCOUNTER — Ambulatory Visit (INDEPENDENT_AMBULATORY_CARE_PROVIDER_SITE_OTHER): Payer: Medicaid Other

## 2016-01-08 VITALS — BP 146/96 | HR 90 | Temp 98.7°F | Ht 73.0 in | Wt 143.8 lb

## 2016-01-08 DIAGNOSIS — J029 Acute pharyngitis, unspecified: Secondary | ICD-10-CM | POA: Diagnosis not present

## 2016-01-08 DIAGNOSIS — L0390 Cellulitis, unspecified: Secondary | ICD-10-CM | POA: Diagnosis not present

## 2016-01-08 DIAGNOSIS — R011 Cardiac murmur, unspecified: Secondary | ICD-10-CM

## 2016-01-08 DIAGNOSIS — J020 Streptococcal pharyngitis: Secondary | ICD-10-CM

## 2016-01-08 DIAGNOSIS — F1990 Other psychoactive substance use, unspecified, uncomplicated: Secondary | ICD-10-CM | POA: Diagnosis not present

## 2016-01-08 DIAGNOSIS — M79642 Pain in left hand: Secondary | ICD-10-CM

## 2016-01-08 LAB — RAPID STREP SCREEN (MED CTR MEBANE ONLY): STREP GP A AG, IA W/REFLEX: POSITIVE — AB

## 2016-01-08 MED ORDER — CLINDAMYCIN HCL 300 MG PO CAPS: 300.0000 mg | ORAL_CAPSULE | Freq: Three times a day (TID) | ORAL | 0 refills | Status: DC

## 2016-01-08 MED ORDER — DOXYCYCLINE HYCLATE 100 MG PO TABS: 100.0000 mg | ORAL_TABLET | Freq: Two times a day (BID) | ORAL | 0 refills | Status: DC

## 2016-01-08 NOTE — Progress Notes (Signed)
  Subjective:   Patient ID: Alice [name], female    DOB: 02/24/1965, 50 y.o.   MRN: [id number] CC: Sore Throat and Hand edema (Left)  HPI: Alice PereyraDoris Sherpa is a 50 y.o. female presenting for Sore Throat and Hand edema (Left)  Presents today started getting sick two days ago with congestion, sore throat Noticed mucus on her tonsils yesterday Subjective temp today No fever/chills before today Has had congestion, some coughing, sinus pressure Got out of bed awkwardly and landed on her L hand 4 days ago, has been sore and swollen since then Worse yesterday, slightly better today Has not been taking anything for it Last IV drug use yesterday in same hand that hurts, says swelling was ongoing before that  Relevant past medical, surgical, family and social history reviewed. Allergies and medications reviewed and updated. History  Smoking Status  . Current Every Day Smoker  . Packs/day: 0.50  Smokeless Tobacco  . Never Used   ROS: Per HPI   Objective:    BP (!) 146/96   Pulse 90   Temp 98.7 F (37.1 C) (Oral)   Ht 6\' 1"  (1.854 m)   Wt 143 lb 12.8 oz (65.2 kg)   LMP 07/30/2011   BMI 18.97 kg/m   Wt Readings from Last 3 Encounters:  01/08/16 143 lb 12.8 oz (65.2 kg)  10/31/15 129 lb 12.8 oz (58.9 kg)  10/03/15 125 lb 3.5 oz (56.8 kg)    Gen: NAD, alert, cooperative with exam, NCAT, congested EYES: EOMI, no conjunctival injection, or no icterus ENT:  TMs dull gray b/l, OP with erythema and mucus on tonsils b/l LYMPH: no cervical LAD CV: NRRR, normal S1/S2, III/VI systolic ejection murmur, distal pulses 2+ b/l Resp: CTABL, no wheezes, normal WOB Abd: +BS, soft, NTND. no guarding or organomegaly EXT: no edema b/l ankles Neuro: Alert and oriented, normal sensation b/l hands MSK: L dorsum of hand swollen, TTP over 2-4 metacarpals, mild swelling and purple-yellow bruising present in prox phalanges and PIP joints over same fingers, warm, red dorsum of hand  XRAY L hand without  fracture  Assessment & Plan:  Tyler AasDoris was seen today for sore throat and hand edema.  Diagnoses and all orders for this visit:  Cellulitis, unspecified cellulitis site IV drug use yesterday, swellin gstarted before that after fallin gon hand per pt On exam red, warm, very TTP No fracture on xray Will treat with below, also covers group A strep -     clindamycin (CLEOCIN) 300 MG capsule; Take 1 capsule (300 mg total) by mouth 3 (three) times daily.  Sore throat Positive strep test, treat as above -     Rapid strep screen (not at [facility])  Pain of left hand -     DG Hand Complete Left; Future  Streptococcal sore throat -     clindamycin (CLEOCIN) 300 MG capsule; Take 1 capsule (300 mg total) by mouth 3 (three) times daily.  Heart murmur IV drug user Heart murmur not documented in recent encounters with health care in system Pt says she has had since she was a child Subjective fever started yesterday with sore throat Has been feeling well until current URI sx per pt If not improving, any worsening or new symptoms needs to be seen At risk for endocarditis with IVDU  Follow up plan: Return in about 2 weeks (around 01/22/2016) for Dr. Ermalinda MemosBradshaw, med prob f/u. Rex Krasarol Jontrell Bushong, MD [facility]

## 2016-01-14 ENCOUNTER — Encounter (HOSPITAL_COMMUNITY): Payer: Self-pay | Admitting: *Deleted

## 2016-01-14 ENCOUNTER — Emergency Department (HOSPITAL_COMMUNITY)
Admission: EM | Admit: 2016-01-14 | Discharge: 2016-01-14 | Discharge status: Against Medical Advice | Payer: Medicaid Other | Attending: Emergency Medicine | Admitting: Emergency Medicine

## 2016-01-14 DIAGNOSIS — F419 Anxiety disorder, unspecified: Secondary | ICD-10-CM | POA: Insufficient documentation

## 2016-01-14 DIAGNOSIS — J449 Chronic obstructive pulmonary disease, unspecified: Secondary | ICD-10-CM | POA: Diagnosis not present

## 2016-01-14 DIAGNOSIS — I10 Essential (primary) hypertension: Secondary | ICD-10-CM | POA: Insufficient documentation

## 2016-01-14 DIAGNOSIS — G40909 Epilepsy, unspecified, not intractable, without status epilepticus: Secondary | ICD-10-CM | POA: Diagnosis present

## 2016-01-14 DIAGNOSIS — Z79899 Other long term (current) drug therapy: Secondary | ICD-10-CM | POA: Diagnosis not present

## 2016-01-14 DIAGNOSIS — F17200 Nicotine dependence, unspecified, uncomplicated: Secondary | ICD-10-CM | POA: Diagnosis not present

## 2016-01-14 DIAGNOSIS — F410 Panic disorder [episodic paroxysmal anxiety] without agoraphobia: Secondary | ICD-10-CM

## 2016-01-14 NOTE — Discharge Instructions (Signed)
Follow-up your primary care doctor or return if worse °

## 2016-01-14 NOTE — ED Notes (Signed)
Pt verbalizing that she doesn't want to stay here and she told EMS that she didn't want to come.   Pt is refusing all care at this time. No VS obtained.

## 2016-01-14 NOTE — ED Notes (Signed)
Pt refusing all treatment and is requesting to go [facility].  Pt says her boyfriend is on his way and [place] [facility].  Notified Dr. Adriana Simasook and he is speaking with pt.
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
## 2016-01-14 NOTE — ED Triage Notes (Signed)
Pt comes in by EMS from court. Pt had witnessed seizure like activity while in the courtroom. Per EMS, pt had a stroke 2 months ago with speech impairment resulting from that. Pt states she has had hx of seizures for 7 years but doesn't see a doctor for them. Pt is alert at this time.

## 2016-01-22 ENCOUNTER — Ambulatory Visit: Payer: Medicaid Other | Admitting: Family Medicine

## 2016-02-20 ENCOUNTER — Encounter: Payer: Self-pay | Admitting: Family Medicine

## 2016-03-07 ENCOUNTER — Other Ambulatory Visit: Payer: Self-pay | Admitting: Pediatrics

## 2016-03-07 ENCOUNTER — Other Ambulatory Visit: Payer: Self-pay | Admitting: Family Medicine

## 2016-03-07 DIAGNOSIS — I10 Essential (primary) hypertension: Secondary | ICD-10-CM

## 2016-03-07 DIAGNOSIS — J020 Streptococcal pharyngitis: Secondary | ICD-10-CM

## 2016-03-07 DIAGNOSIS — L0390 Cellulitis, unspecified: Secondary | ICD-10-CM

## 2016-03-08 NOTE — Telephone Encounter (Signed)
Forwarding to PCP/Bradshaw

## 2016-04-08 ENCOUNTER — Emergency Department (HOSPITAL_COMMUNITY)

## 2016-04-08 ENCOUNTER — Emergency Department (HOSPITAL_COMMUNITY)
Admission: EM | Admit: 2016-04-08 | Discharge: 2016-04-08 | Disposition: A | Discharge status: Home / Self Care | Attending: Emergency Medicine | Admitting: Emergency Medicine

## 2016-04-08 ENCOUNTER — Encounter (HOSPITAL_COMMUNITY): Payer: Self-pay | Admitting: Emergency Medicine

## 2016-04-08 DIAGNOSIS — F17200 Nicotine dependence, unspecified, uncomplicated: Secondary | ICD-10-CM | POA: Diagnosis not present

## 2016-04-08 DIAGNOSIS — M25552 Pain in left hip: Secondary | ICD-10-CM

## 2016-04-08 DIAGNOSIS — R011 Cardiac murmur, unspecified: Secondary | ICD-10-CM | POA: Diagnosis not present

## 2016-04-08 DIAGNOSIS — I10 Essential (primary) hypertension: Secondary | ICD-10-CM | POA: Insufficient documentation

## 2016-04-08 DIAGNOSIS — Z79899 Other long term (current) drug therapy: Secondary | ICD-10-CM | POA: Insufficient documentation

## 2016-04-08 DIAGNOSIS — J449 Chronic obstructive pulmonary disease, unspecified: Secondary | ICD-10-CM | POA: Diagnosis not present

## 2016-04-08 HISTORY — DX: Chronic obstructive pulmonary disease, unspecified: J44.9

## 2016-04-08 HISTORY — DX: Cerebral infarction, unspecified: I63.9

## 2016-04-08 MED ORDER — IBUPROFEN 400 MG PO TABS
600.0000 mg | ORAL_TABLET | Freq: Once | ORAL | Status: AC
  Administered 2016-04-08: 600 mg via ORAL
  Filled 2016-04-08: qty 2

## 2016-04-08 MED ORDER — LORAZEPAM 1 MG PO TABS
1.0000 mg | ORAL_TABLET | Freq: Once | ORAL | Status: AC
  Administered 2016-04-08: 1 mg via ORAL
  Filled 2016-04-08: qty 1

## 2016-04-08 NOTE — ED Notes (Signed)
Patient called out wanting something to eat. Patient informed all her xrays were not back at this time she would need to wait till they resulted.

## 2016-04-08 NOTE — ED Provider Notes (Signed)
[facility]-EMERGENCY DEPT Provider Note   CSN: [id number] Arrival date & time: 04/08/16  1653  By signing my name below, I, Alice [name], attest that this documentation has been prepared under the direction and in the presence of Raeford Razor, MD. Electronically Signed: Rosario [name], ED Scribe. 04/08/16. 5:40 PM.  History   Chief Complaint Chief Complaint  Patient presents with  . Hip Pain   The history is provided by the patient. No language interpreter was used.    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 Per pt, she was getting up from sitting today when she lost her footing with her left leg which caused her to fall over onto her left side and hip onto a concrete flooring below her. No LOC, head injury, or reported injury otherwise. No h/o prior injury, pain, or surgery to this hip. She has been unable to bear weight to the extremity since her fall secondary to this worsening her pain. No treatments for her pain were tried prior to coming into the ED. She denies knee pain, or any other associated symptoms.   Past Medical History:  Diagnosis Date  . Anemia   . ANEMIA 12/05/2009   Qualifier: Diagnosis of  By: Zachary George    . Chronic low back pain   . COPD (chronic obstructive pulmonary disease) (HCC)   . Heart murmur   . Hypertension   . Stroke [facility])    Patient Active Problem List   Diagnosis Date Noted  . Heart murmur 01/08/2016  . IV drug user 01/08/2016  . COPD (chronic obstructive pulmonary disease) (HCC) 10/31/2015  . 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 AKI (acute kidney injury) (HCC) 10/04/2015  . Hypokalemia 10/04/2015  . Dyslipidemia 10/04/2015  . Benign essential  HTN 10/04/2015  . Leukocytosis 10/04/2015  . Drug abuse 10/04/2015  . 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 Opiate dependence (HCC) 07/30/2011    Class: Acute  . TOBACCO ABUSE 12/05/2009   Past Surgical History:  Procedure Laterality Date  . ABDOMINAL HYSTERECTOMY     abd?  Marland Kitchen BACK SURGERY     hardware in back  . cyst removed from ovary    . FRACTURE SURGERY     LUMBAR SPINE  . removal of BB in finger  2008  . SALIVARY GLAND SURGERY    . tumor reomoved from stomach     OB History    No data available     Home Medications    Prior to Admission medications   Medication Sig Start Date End Date Taking? Authorizing Provider  acetaminophen (TYLENOL) 325 MG tablet Take 650 mg by mouth every 6 (six) hours as needed.    Historical Provider, MD  albuterol (PROVENTIL HFA;VENTOLIN HFA) 108 (90 Base) MCG/ACT inhaler Inhale 2 puffs into the lungs every 6 (six) hours as needed for wheezing or shortness of breath. 10/31/15   Elenora Gamma, MD  atorvastatin (LIPITOR) 20 MG tablet Take 1 tablet (20 mg total) by mouth daily. 10/31/15   Elenora Gamma, MD  clindamycin (CLEOCIN) 300 MG capsule Take 1 capsule (300 mg total) by mouth 3 (three) times daily. 01/08/16   Johna Sheriff, MD  ibuprofen (ADVIL,MOTRIN) 200 MG tablet Take 400 mg by mouth every 6 (six) hours as needed for moderate pain.     Historical Provider, MD  lisinopril (PRINIVIL,ZESTRIL) 5 MG tablet TAKE 2 TABLETS (10 MG TOTAL) BY MOUTH DAILY. 03/08/16   Elige RadonJoshua A Dettinger, MD  tiotropium (SPIRIVA HANDIHALER) 18 MCG inhalation capsule Place 1 capsule (18 mcg total) into inhaler and inhale daily. 10/31/15   Elenora GammaSamuel L Bradshaw, MD   Family History Family History  Problem Relation Age of Onset  . Hypertension Mother   . Cancer Father    Social History Social History  Substance Use Topics  . Smoking status: Current Every Day Smoker    Packs/day: 0.50  .  Smokeless tobacco: Never Used  . Alcohol use No   Allergies   Codeine and Tetracycline  Review of Systems Review of Systems  Constitutional: Negative for fever.  Musculoskeletal: Positive for arthralgias (left) and myalgias.  Neurological: Negative for syncope.  All other systems reviewed and are negative.  Physical Exam Updated Vital Signs BP 125/83   Pulse 78   Temp 98.6 F (37 C)   Resp 18   Ht 6' (1.829 m)   Wt 125 lb (56.7 kg)   LMP 07/30/2011   SpO2 100%   BMI 16.95 kg/m   Physical Exam  Constitutional: She appears well-developed and well-nourished.  HENT:  Head: Normocephalic.  Right Ear: External ear normal.  Left Ear: External ear normal.  Nose: Nose normal.  Mouth/Throat: Oropharynx is clear and moist.  Eyes: Conjunctivae are normal. Right eye exhibits no discharge. Left eye exhibits no discharge.  Neck: Normal range of motion.  Cardiovascular: Normal rate and regular rhythm.   Murmur heard. Very harsh systolic murmur.   Pulmonary/Chest: Effort normal and breath sounds normal. No respiratory distress. She has no wheezes. She has no rales.  Abdominal: Soft. She exhibits no distension. There is no tenderness. There is no rebound and no guarding.  Musculoskeletal: She exhibits tenderness. She exhibits no edema.  Holding leg with left hip slightly flexed. Significant pain with any attempted ROM. She is neurovascularly intact. No midline spinal tenderness.   Neurological: She is alert. No cranial nerve deficit. Coordination normal.  Skin: Skin is warm and dry. No rash noted. No erythema. No pallor.  Psychiatric: She has a normal mood and affect. Her behavior is normal.  Nursing note and vitals reviewed.  ED Treatments / Results  DIAGNOSTIC STUDIES: Oxygen Saturation is 100% on RA, normal by my interpretation.   COORDINATION OF CARE: 5:36 PM-Discussed next steps with pt. Pt verbalized understanding and is agreeable with the plan.   Labs (all labs ordered  are listed, but only abnormal results are displayed) Labs Reviewed - No data to display  EKG  EKG Interpretation None      Radiology Ct Hip Left Wo Contrast  Result Date: 04/08/2016 CLINICAL DATA:  Sudden onset of worsening LEFT hip pain after a fall 3 hours prior to admission EXAM: CT OF THE LEFT HIP WITHOUT CONTRAST TECHNIQUE: Multidetector CT imaging of the left hip was performed according to the standard protocol. Multiplanar CT image reconstructions were also generated. COMPARISON:  Radiographs 04/08/2016 FINDINGS: Bones/Joint/Cartilage Osseous demineralization. No visualized pelvic or LEFT femoral fracture. No dislocation. Mild joint space narrowing. Ligaments Suboptimally assessed by CT. Muscles and Tendons Muscular planes unremarkable. Soft tissues Bladder well distended, unremarkable. Visualized intrapelvic soft tissues normal appearance. IMPRESSION: No definite evidence of a proximal LEFT femoral fracture or visualized LEFT pelvic fracture. If patient  has persistent unexplained pain or inability to bear weight, consider MR follow-up as noted on prior radiographic report. Electronically Signed   By: Ulyses Southward M.D.   On: 04/08/2016 19:56   Dg Hip Unilat W Or Wo Pelvis 2-3 Views Left  Result Date: 04/08/2016 CLINICAL DATA:  Trip and fall from standing 30 minutes ago. LEFT hip pain. EXAM: DG HIP (WITH OR WITHOUT PELVIS) 2-3V LEFT COMPARISON:  None. FINDINGS: Femoral heads are well formed and located. Hip joint spaces are intact, bilateral undersurface spurring. Sacroiliac joints are symmetric. No destructive bony lesions. Osteopenia. Lower lumbar PLIF. Included soft tissue planes are non-suspicious. IMPRESSION: No acute fracture deformity or dislocation. If there is high clinical suspicion for occult hip fracture or if the patient refuses to bear-weight, consider further evaluation with MRI. Although CT is expeditious, evidence is lacking regarding accuracy of CT over plain film radiography.  Electronically Signed   By: Awilda Metro M.D.   On: 04/08/2016 17:34   Procedures Procedures   Medications Ordered in ED Medications  ibuprofen (ADVIL,MOTRIN) tablet 600 mg (not administered)  LORazepam (ATIVAN) tablet 1 mg (not administered)    Initial Impression / Assessment and Plan / ED Course  I have reviewed the triage vital signs and the nursing notes.  Pertinent labs & imaging results that were available during my care of the patient were reviewed by me and considered in my medical decision making (see chart for details).     51 year female status post fall with left hip pain. Imaging including CT of her hip is negative for acute osseous abnormality. MRI would give better definition, but patient is currently incarcerated and law enforcement is hesitant to allow her out of metallic restraints. I don't feel strongly that this needs to be pursued at this point regardless.   Final Clinical Impressions(s) / ED Diagnoses   Final diagnoses:  Left hip pain   New Prescriptions New Prescriptions   No medications on file   I personally preformed the services scribed in my presence. The recorded information has been reviewed is accurate. Raeford Razor, MD.     Raeford Razor, MD 04/12/16 [phone]

## 2016-04-08 NOTE — ED Notes (Signed)
Patient transported to X-ray 

## 2016-04-08 NOTE — ED Triage Notes (Signed)
Pt reports tripping and falling from standing position x 30 minutes ago. c/o left hip pain. Denies hitting head/loc.

## 2016-05-09 DIAGNOSIS — B182 Chronic viral hepatitis C: Secondary | ICD-10-CM | POA: Insufficient documentation

## 2016-05-09 DIAGNOSIS — R2990 Unspecified symptoms and signs involving the nervous system: Secondary | ICD-10-CM | POA: Insufficient documentation

## 2016-05-09 DIAGNOSIS — Z8673 Personal history of transient ischemic attack (TIA), and cerebral infarction without residual deficits: Secondary | ICD-10-CM | POA: Insufficient documentation

## 2016-05-09 DIAGNOSIS — F1911 Other psychoactive substance abuse, in remission: Secondary | ICD-10-CM | POA: Insufficient documentation

## 2016-05-09 DIAGNOSIS — J329 Chronic sinusitis, unspecified: Secondary | ICD-10-CM | POA: Insufficient documentation

## 2016-09-03 ENCOUNTER — Emergency Department (HOSPITAL_COMMUNITY)

## 2016-09-03 ENCOUNTER — Inpatient Hospital Stay (HOSPITAL_COMMUNITY)

## 2016-09-03 ENCOUNTER — Encounter (HOSPITAL_COMMUNITY): Payer: Self-pay

## 2016-09-03 ENCOUNTER — Inpatient Hospital Stay (HOSPITAL_COMMUNITY)
Admission: EM | Admit: 2016-09-03 | Discharge: 2016-09-05 | DRG: 063 | Disposition: A | Discharge status: Home / Self Care | Attending: Neurology | Admitting: Neurology

## 2016-09-03 DIAGNOSIS — G8929 Other chronic pain: Secondary | ICD-10-CM | POA: Diagnosis present

## 2016-09-03 DIAGNOSIS — M545 Low back pain: Secondary | ICD-10-CM | POA: Diagnosis present

## 2016-09-03 DIAGNOSIS — R29700 NIHSS score 0: Secondary | ICD-10-CM | POA: Diagnosis not present

## 2016-09-03 DIAGNOSIS — Z8249 Family history of ischemic heart disease and other diseases of the circulatory system: Secondary | ICD-10-CM | POA: Diagnosis not present

## 2016-09-03 DIAGNOSIS — I639 Cerebral infarction, unspecified: Secondary | ICD-10-CM | POA: Diagnosis present

## 2016-09-03 DIAGNOSIS — G459 Transient cerebral ischemic attack, unspecified: Principal | ICD-10-CM | POA: Diagnosis present

## 2016-09-03 DIAGNOSIS — E785 Hyperlipidemia, unspecified: Secondary | ICD-10-CM | POA: Diagnosis present

## 2016-09-03 DIAGNOSIS — Z8659 Personal history of other mental and behavioral disorders: Secondary | ICD-10-CM | POA: Diagnosis not present

## 2016-09-03 DIAGNOSIS — R402362 Coma scale, best motor response, obeys commands, at arrival to emergency department: Secondary | ICD-10-CM | POA: Diagnosis present

## 2016-09-03 DIAGNOSIS — F419 Anxiety disorder, unspecified: Secondary | ICD-10-CM

## 2016-09-03 DIAGNOSIS — R402142 Coma scale, eyes open, spontaneous, at arrival to emergency department: Secondary | ICD-10-CM | POA: Diagnosis present

## 2016-09-03 DIAGNOSIS — J449 Chronic obstructive pulmonary disease, unspecified: Secondary | ICD-10-CM | POA: Diagnosis present

## 2016-09-03 DIAGNOSIS — F40240 Claustrophobia: Secondary | ICD-10-CM | POA: Diagnosis present

## 2016-09-03 DIAGNOSIS — I10 Essential (primary) hypertension: Secondary | ICD-10-CM | POA: Diagnosis present

## 2016-09-03 DIAGNOSIS — F329 Major depressive disorder, single episode, unspecified: Secondary | ICD-10-CM | POA: Diagnosis present

## 2016-09-03 DIAGNOSIS — M549 Dorsalgia, unspecified: Secondary | ICD-10-CM

## 2016-09-03 DIAGNOSIS — R29706 NIHSS score 6: Secondary | ICD-10-CM | POA: Diagnosis present

## 2016-09-03 DIAGNOSIS — Z79899 Other long term (current) drug therapy: Secondary | ICD-10-CM

## 2016-09-03 DIAGNOSIS — R531 Weakness: Secondary | ICD-10-CM | POA: Diagnosis present

## 2016-09-03 DIAGNOSIS — D649 Anemia, unspecified: Secondary | ICD-10-CM | POA: Diagnosis present

## 2016-09-03 DIAGNOSIS — I63529 Cerebral infarction due to unspecified occlusion or stenosis of unspecified anterior cerebral artery: Secondary | ICD-10-CM

## 2016-09-03 DIAGNOSIS — Z888 Allergy status to other drugs, medicaments and biological substances status: Secondary | ICD-10-CM

## 2016-09-03 DIAGNOSIS — Z8673 Personal history of transient ischemic attack (TIA), and cerebral infarction without residual deficits: Secondary | ICD-10-CM

## 2016-09-03 DIAGNOSIS — Z7982 Long term (current) use of aspirin: Secondary | ICD-10-CM

## 2016-09-03 DIAGNOSIS — Z87891 Personal history of nicotine dependence: Secondary | ICD-10-CM

## 2016-09-03 DIAGNOSIS — R402252 Coma scale, best verbal response, oriented, at arrival to emergency department: Secondary | ICD-10-CM | POA: Diagnosis present

## 2016-09-03 DIAGNOSIS — Z7951 Long term (current) use of inhaled steroids: Secondary | ICD-10-CM

## 2016-09-03 DIAGNOSIS — Z885 Allergy status to narcotic agent status: Secondary | ICD-10-CM

## 2016-09-03 DIAGNOSIS — Z881 Allergy status to other antibiotic agents status: Secondary | ICD-10-CM

## 2016-09-03 LAB — DIFFERENTIAL
BASOS PCT: 1 %
Basophils Absolute: 0 10*3/uL (ref 0.0–0.1)
EOS ABS: 0.2 10*3/uL (ref 0.0–0.7)
EOS PCT: 3 %
Lymphocytes Relative: 31 %
Lymphs Abs: 2.1 10*3/uL (ref 0.7–4.0)
MONO ABS: 0.6 10*3/uL (ref 0.1–1.0)
Monocytes Relative: 9 %
Neutro Abs: 3.9 10*3/uL (ref 1.7–7.7)
Neutrophils Relative %: 56 %

## 2016-09-03 LAB — I-STAT CHEM 8, ED
BUN: 16 mg/dL (ref 6–20)
CALCIUM ION: 1.2 mmol/L (ref 1.15–1.40)
CHLORIDE: 102 mmol/L (ref 101–111)
Creatinine, Ser: 0.7 mg/dL (ref 0.44–1.00)
Glucose, Bld: 87 mg/dL (ref 65–99)
HCT: 35 % — ABNORMAL LOW (ref 36.0–46.0)
Hemoglobin: 11.9 g/dL — ABNORMAL LOW (ref 12.0–15.0)
Potassium: 4 mmol/L (ref 3.5–5.1)
SODIUM: 143 mmol/L (ref 135–145)
TCO2: 30 mmol/L (ref 22–32)

## 2016-09-03 LAB — COMPREHENSIVE METABOLIC PANEL
ALT: 11 U/L — ABNORMAL LOW (ref 14–54)
ANION GAP: 6 (ref 5–15)
AST: 17 U/L (ref 15–41)
Albumin: 3.9 g/dL (ref 3.5–5.0)
Alkaline Phosphatase: 86 U/L (ref 38–126)
BUN: 15 mg/dL (ref 6–20)
CHLORIDE: 104 mmol/L (ref 101–111)
CO2: 30 mmol/L (ref 22–32)
Calcium: 8.9 mg/dL (ref 8.9–10.3)
Creatinine, Ser: 0.65 mg/dL (ref 0.44–1.00)
GFR calc non Af Amer: >60 mL/min (ref >60)
Glucose, Bld: 90 mg/dL (ref 65–99)
Potassium: 3.9 mmol/L (ref 3.5–5.1)
SODIUM: 140 mmol/L (ref 135–145)
Total Bilirubin: 0.4 mg/dL (ref 0.3–1.2)
Total Protein: 6.9 g/dL (ref 6.5–8.1)

## 2016-09-03 LAB — CBC
HCT: 36 % (ref 36.0–46.0)
Hemoglobin: 12.1 g/dL (ref 12.0–15.0)
MCH: 31 pg (ref 26.0–34.0)
MCHC: 33.6 g/dL (ref 30.0–36.0)
MCV: 92.3 fL (ref 78.0–100.0)
PLATELETS: 174 10*3/uL (ref 150–400)
RBC: 3.9 MIL/uL (ref 3.87–5.11)
RDW: 13 % (ref 11.5–15.5)
WBC: 6.8 10*3/uL (ref 4.0–10.5)

## 2016-09-03 LAB — I-STAT TROPONIN, ED: TROPONIN I, POC: 0 ng/mL (ref 0.00–0.08)

## 2016-09-03 LAB — ETHANOL

## 2016-09-03 LAB — CBG MONITORING, ED: GLUCOSE-CAPILLARY: 80 mg/dL (ref 65–99)

## 2016-09-03 LAB — PROTIME-INR
INR: 1.11
PROTHROMBIN TIME: 14.4 s (ref 11.4–15.2)

## 2016-09-03 LAB — MRSA PCR SCREENING: MRSA by PCR: NEGATIVE

## 2016-09-03 LAB — APTT: aPTT: 29 seconds (ref 24–36)

## 2016-09-03 MED ORDER — ALTEPLASE 100 MG IV SOLR
INTRAVENOUS | Status: AC
  Administered 2016-09-03: 62 mg via INTRAVENOUS
  Filled 2016-09-03: qty 100

## 2016-09-03 MED ORDER — IOPAMIDOL (ISOVUE-370) INJECTION 76%
75.0000 mL | Freq: Once | INTRAVENOUS | Status: AC | PRN
  Administered 2016-09-03: 75 mL via INTRAVENOUS

## 2016-09-03 MED ORDER — SODIUM CHLORIDE 0.9 % IV SOLN
50.0000 mL | Freq: Once | INTRAVENOUS | Status: AC
  Administered 2016-09-03: 50 mL via INTRAVENOUS

## 2016-09-03 MED ORDER — ALTEPLASE (STROKE) FULL DOSE INFUSION
0.9000 mg/kg | Freq: Once | INTRAVENOUS | Status: AC
  Administered 2016-09-03: 62 mg via INTRAVENOUS
  Filled 2016-09-03: qty 100

## 2016-09-03 MED ORDER — NICARDIPINE HCL IN NACL 20-0.86 MG/200ML-% IV SOLN: 3.0000 mg/h | INTRAVENOUS | Status: DC

## 2016-09-03 MED ORDER — SODIUM CHLORIDE 0.9 % IV SOLN
INTRAVENOUS | Status: DC
  Administered 2016-09-03 – 2016-09-04 (×2): via INTRAVENOUS

## 2016-09-03 MED ORDER — ACETAMINOPHEN 650 MG RE SUPP: 650.0000 mg | RECTAL | Status: DC | PRN

## 2016-09-03 MED ORDER — AMITRIPTYLINE HCL 25 MG PO TABS
25.0000 mg | ORAL_TABLET | Freq: Every day | ORAL | Status: DC
  Administered 2016-09-03 – 2016-09-04 (×2): 25 mg via ORAL
  Filled 2016-09-03 (×3): qty 1

## 2016-09-03 MED ORDER — ALBUTEROL SULFATE (2.5 MG/3ML) 0.083% IN NEBU
3.0000 mL | INHALATION_SOLUTION | Freq: Four times a day (QID) | RESPIRATORY_TRACT | Status: DC | PRN
Start: 2016-09-03 — End: 2016-09-05

## 2016-09-03 MED ORDER — SENNOSIDES-DOCUSATE SODIUM 8.6-50 MG PO TABS: 1.0000 | ORAL_TABLET | Freq: Every evening | ORAL | Status: DC | PRN

## 2016-09-03 MED ORDER — DULOXETINE HCL 60 MG PO CPEP
60.0000 mg | ORAL_CAPSULE | Freq: Every day | ORAL | Status: DC
  Administered 2016-09-03 – 2016-09-05 (×3): 60 mg via ORAL
  Filled 2016-09-03 (×3): qty 1

## 2016-09-03 MED ORDER — STROKE: EARLY STAGES OF RECOVERY BOOK
Freq: Once | Status: AC
  Administered 2016-09-05: 09:00:00
  Filled 2016-09-03 (×2): qty 1

## 2016-09-03 MED ORDER — ACETAMINOPHEN 160 MG/5ML PO SOLN
650.0000 mg | ORAL | Status: DC | PRN
Start: 2016-09-03 — End: 2016-09-05

## 2016-09-03 MED ORDER — ASPIRIN 81 MG PO CHEW
324.0000 mg | CHEWABLE_TABLET | Freq: Once | ORAL | Status: AC
  Administered 2016-09-03: 324 mg via ORAL
  Filled 2016-09-03: qty 4

## 2016-09-03 MED ORDER — ACETAMINOPHEN 325 MG PO TABS
650.0000 mg | ORAL_TABLET | ORAL | Status: DC | PRN
  Administered 2016-09-04: 650 mg via ORAL
  Filled 2016-09-03: qty 2

## 2016-09-03 MED ORDER — ATORVASTATIN CALCIUM 10 MG PO TABS
20.0000 mg | ORAL_TABLET | Freq: Every day | ORAL | Status: DC
  Administered 2016-09-03 – 2016-09-04 (×2): 20 mg via ORAL
  Filled 2016-09-03 (×2): qty 1

## 2016-09-03 NOTE — ED Notes (Signed)
Pt attempted to use bedpan to get urine sample. Pt unable to urinate at this time.

## 2016-09-03 NOTE — ED Provider Notes (Signed)
[facility] DEPT Provider Note   CSN: [id number] Arrival date & time: 09/03/16  1018   An emergency department physician performed an initial assessment on this suspected stroke patient at 1020.  History   Chief Complaint Chief Complaint  Patient presents with  . Code Stroke    HPI Alice [name] is a 51 y.o. female.  Patient states around 9 AM this morning she started to have some difficulty speaking numbness to left side her face numbness to left arm.   Patient also has had difficulty ambulating    Weakness  Primary symptoms include focal weakness. This is a new problem. The current episode started less than 1 hour ago. The problem has not changed since onset.There was left upper extremity, right lower extremity and left lower extremity focality noted. There has been no fever. Pertinent negatives include no shortness of breath, no chest pain and no headaches. There were no medications administered prior to arrival. Associated medical issues do not include trauma.    Past Medical History:  Diagnosis Date  . Anemia   . ANEMIA 12/05/2009   Qualifier: Diagnosis of  By: Zachary George    . Chronic low back pain   . COPD (chronic obstructive pulmonary disease) (HCC)   . Heart murmur   . Hypertension   . Stroke [facility])     Patient Active Problem List   Diagnosis Date Noted  . Heart murmur 01/08/2016  . IV drug user 01/08/2016  . COPD (chronic obstructive pulmonary disease) (HCC) 10/31/2015  . 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 AKI (acute kidney injury) (HCC) 10/04/2015  . Hypokalemia 10/04/2015  . Dyslipidemia 10/04/2015  . Benign essential HTN 10/04/2015  . Leukocytosis 10/04/2015  . Drug abuse 10/04/2015  . 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 Opiate dependence (HCC) 07/30/2011    Class: Acute  . TOBACCO ABUSE 12/05/2009    Past Surgical History:  Procedure Laterality  Date  . ABDOMINAL HYSTERECTOMY     abd?  Marland Kitchen BACK SURGERY     hardware in back  . cyst removed from ovary    . FRACTURE SURGERY     LUMBAR SPINE  . removal of BB in finger  2008  . SALIVARY GLAND SURGERY    . tumor reomoved from stomach      OB History    No data available       Home Medications    Prior to Admission medications   Medication Sig Start Date End Date Taking? Authorizing Provider  acetaminophen (TYLENOL) 325 MG tablet Take 650 mg by mouth every 6 (six) hours as needed.   Yes [provider]  amitriptyline (ELAVIL) 25 MG tablet Take 25 mg by mouth at bedtime.   Yes [provider]  aspirin EC 81 MG tablet Take 81 mg by mouth daily.   Yes [provider]  DULoxetine (CYMBALTA) 60 MG capsule Take 60 mg by mouth daily.   Yes [provider]  fluPHENAZine (PROLIXIN) 1 MG tablet Take 1 mg by mouth daily.   Yes [provider]  lisinopril (PRINIVIL,ZESTRIL) 5 MG tablet TAKE 2 TABLETS (10 MG TOTAL) BY MOUTH DAILY. 03/08/16  Yes Dettinger, Elige Radon, MD  mometasone-formoterol (DULERA) 100-5 MCG/ACT AERO Inhale 2 puffs into the lungs 2 (two) times daily.   Yes [provider]  albuterol (PROVENTIL HFA;VENTOLIN HFA) 108 (90 Base) MCG/ACT inhaler Inhale 2 puffs into the lungs every 6 (six) hours  as needed for wheezing or shortness of breath. Patient not taking: Reported on 09/03/2016 10/31/15   Elenora Gamma, MD  atorvastatin (LIPITOR) 20 MG tablet Take 1 tablet (20 mg total) by mouth daily. Patient not taking: Reported on 09/03/2016 10/31/15   Elenora Gamma, MD  clindamycin (CLEOCIN) 300 MG capsule Take 1 capsule (300 mg total) by mouth 3 (three) times daily. Patient not taking: Reported on 09/03/2016 01/08/16   Johna Sheriff, MD  tiotropium (SPIRIVA HANDIHALER) 18 MCG inhalation capsule Place 1 capsule (18 mcg total) into inhaler and inhale daily. Patient not taking: Reported on 09/03/2016 10/31/15   Elenora Gamma, MD    Family History Family History  Problem Relation Age of Onset  . Hypertension Mother   . Cancer Father     Social History Social History  Substance Use Topics  . Smoking status: Current Every Day Smoker    Packs/day: 0.50  . Smokeless tobacco: Never Used  . Alcohol use No     Allergies   Benadryl [diphenhydramine hcl]; Codeine; and Tetracycline   Review of Systems Review of Systems  Constitutional: Negative for appetite change and fatigue.  HENT: Negative for congestion, ear discharge and sinus pressure.   Eyes: Negative for discharge.  Respiratory: Negative for cough and shortness of breath.   Cardiovascular: Negative for chest pain.  Gastrointestinal: Negative for abdominal pain and diarrhea.  Genitourinary: Negative for frequency and hematuria.  Musculoskeletal: Negative for back pain.  Skin: Negative for rash.  Neurological: Positive for focal weakness and weakness. Negative for seizures and headaches.       Some slurred speech  Psychiatric/Behavioral: Negative for hallucinations.     Physical Exam Updated Vital Signs BP (!) 130/94   Pulse 80   Temp 98.7 F (37.1 C)   Resp 13   Wt 68.4 kg (150 lb 14.4 oz)   LMP 07/30/2011   SpO2 100%   BMI 20.47 kg/m   Physical Exam  Constitutional: She is oriented to person, place, and time. She appears well-developed.  HENT:  Head: Normocephalic.  Eyes: Conjunctivae and EOM are normal. No scleral icterus.  Neck: Neck supple. No thyromegaly present.  Cardiovascular: Normal rate and regular rhythm.  Exam reveals no gallop and no friction rub.   No murmur heard. Pulmonary/Chest: No stridor. She has no wheezes. She has no rales. She exhibits no tenderness.  Abdominal: She exhibits no distension. There is no tenderness. There is no rebound.  Musculoskeletal: Normal range of motion. She exhibits no edema.  Lymphadenopathy:    She has no cervical adenopathy.  Neurological: She is alert and oriented to person,  place, and time. She exhibits normal muscle tone.  Patient has minimal slurred speech. Mild weakness in left arm Patient has an ataxic gait. Patient brings her legs way up in the air  Skin: No rash noted. No erythema.  Psychiatric: She has a normal mood and affect. Her behavior is normal.     ED Treatments / Results  Labs (all labs ordered are listed, but only abnormal results are displayed) Labs Reviewed  COMPREHENSIVE METABOLIC PANEL - Abnormal; Notable for the following:       Result Value   ALT 11 (*)    All other components within normal limits  I-STAT CHEM 8, ED - Abnormal; Notable for the following:    Hemoglobin 11.9 (*)    HCT 35.0 (*)    All other components within normal limits  ETHANOL  PROTIME-INR  APTT  CBC  DIFFERENTIAL  RAPID URINE DRUG SCREEN, HOSP PERFORMED  URINALYSIS, ROUTINE W REFLEX MICROSCOPIC  I-STAT TROPONIN, ED  CBG MONITORING, ED    EKG  EKG Interpretation None       Radiology Ct Angio Head W Or Wo Contrast  Result Date: 09/03/2016 CLINICAL DATA:  Stroke follow-up. Status post tPA. Generalized weakness. EXAM: CT ANGIOGRAPHY HEAD AND NECK TECHNIQUE: Multidetector CT imaging of the head and neck was performed using the standard protocol during bolus administration of intravenous contrast. Multiplanar CT image reconstructions and MIPs were obtained to evaluate the vascular anatomy. Carotid stenosis measurements (when applicable) are obtained utilizing NASCET criteria, using the distal internal carotid diameter as the denominator. CONTRAST:  75 cc Isovue 370 intravenous COMPARISON:  Earlier today. FINDINGS: CTA NECK FINDINGS Aortic arch: Mild atheromatous thickening and calcification of the arch. Right carotid system: Minimal calcified plaque at the common carotid bifurcation. No stenosis or ulceration. Negative for dissection. Left carotid system: Partial obscuration of the proximal common carotid artery due to streak artifact from intravenous contrast.  Minimal partially calcified plaque at the common carotid bifurcation. No stenosis, ulceration, or dissection. Vertebral arteries: No proximal subclavian stenosis. Both vertebral arteries are smooth and widely patent. Skeleton: Sequela C6-7 discitis osteomyelitis. No acute or aggressive finding. Other neck: No incidental mass or inflammation. Upper chest: No acute finding Review of the MIP images confirms the above findings CTA HEAD FINDINGS Anterior circulation: Standard branching. Vessels are smooth and widely patent. Vasculopathic findings suggested previously are not seen today. Negative for aneurysm. Posterior circulation: Mild left dominance. The vertebral, basilar, posterior cerebral, and major cerebellar vessels are smooth and widely patent. Negative for aneurysm. Venous sinuses: Patent Anatomic variants: None significant Delayed phase: Not obtained in the emergent setting. No abnormal intracranial enhancement. Review of the MIP images confirms the above findings IMPRESSION: 1. No acute arterial finding.  No emergent large vessel occlusion. 2. Mild atherosclerosis.  Negative for stenosis. Electronically Signed   By: Marnee Spring M.D.   On: 09/03/2016 12:55   Ct Angio Neck W And/or Wo Contrast  Result Date: 09/03/2016 CLINICAL DATA:  Stroke follow-up. Status post tPA. Generalized weakness. EXAM: CT ANGIOGRAPHY HEAD AND NECK TECHNIQUE: Multidetector CT imaging of the head and neck was performed using the standard protocol during bolus administration of intravenous contrast. Multiplanar CT image reconstructions and MIPs were obtained to evaluate the vascular anatomy. Carotid stenosis measurements (when applicable) are obtained utilizing NASCET criteria, using the distal internal carotid diameter as the denominator. CONTRAST:  75 cc Isovue 370 intravenous COMPARISON:  Earlier today. FINDINGS: CTA NECK FINDINGS Aortic arch: Mild atheromatous thickening and calcification of the arch. Right carotid system:  Minimal calcified plaque at the common carotid bifurcation. No stenosis or ulceration. Negative for dissection. Left carotid system: Partial obscuration of the proximal common carotid artery due to streak artifact from intravenous contrast. Minimal partially calcified plaque at the common carotid bifurcation. No stenosis, ulceration, or dissection. Vertebral arteries: No proximal subclavian stenosis. Both vertebral arteries are smooth and widely patent. Skeleton: Sequela C6-7 discitis osteomyelitis. No acute or aggressive finding. Other neck: No incidental mass or inflammation. Upper chest: No acute finding Review of the MIP images confirms the above findings CTA HEAD FINDINGS Anterior circulation: Standard branching. Vessels are smooth and widely patent. Vasculopathic findings suggested previously are not seen today. Negative for aneurysm. Posterior circulation: Mild left dominance. The vertebral, basilar, posterior cerebral, and major cerebellar vessels are smooth and widely patent. Negative for aneurysm. Venous sinuses: Patent Anatomic  variants: None significant Delayed phase: Not obtained in the emergent setting. No abnormal intracranial enhancement. Review of the MIP images confirms the above findings IMPRESSION: 1. No acute arterial finding.  No emergent large vessel occlusion. 2. Mild atherosclerosis.  Negative for stenosis. Electronically Signed   By: Marnee Spring M.D.   On: 09/03/2016 12:55   Ct Head Code Stroke Wo Contrast  Result Date: 09/03/2016 CLINICAL DATA:  Code stroke.  Severe generalized weakness. EXAM: CT HEAD WITHOUT CONTRAST TECHNIQUE: Contiguous axial images were obtained from the base of the skull through the vertex without intravenous contrast. COMPARISON:  10/04/2016.  10/03/2016. FINDINGS: Brain: Chronic small-vessel ischemic changes are seen affecting the pons. Cerebral hemispheres appear normal without evidence of atrophy, old or recent infarction, mass lesion, hemorrhage,  hydrocephalus or extra-axial collection. Vascular: No abnormal vascular finding. Skull: Negative Sinuses/Orbits: Clear/normal Other: None ASPECTS (Alberta Stroke Program Early CT Score) - Ganglionic level infarction (caudate, lentiform nuclei, internal capsule, insula, M1-M3 cortex): 7 - Supraganglionic infarction (M4-M6 cortex): 3 Total score (0-10 with 10 being normal): 10 IMPRESSION: 1. No acute finding by CT. Old small vessel ischemic changes of the pons. A recent insult could be hidden within these chronic changes. 2. ASPECTS is 10. These results were called by telephone at the time of interpretation on 09/03/2016 at 10:33 am to Dr. Bethann Berkshire , who verbally acknowledged these results. Electronically Signed   By: Paulina Fusi M.D.   On: 09/03/2016 10:34    Procedures Procedures (including critical care time)  Medications Ordered in ED Medications  alteplase (ACTIVASE) 1 mg/mL infusion 62 mg (0 mg/kg  68.4 kg Intravenous Stopped 09/03/16 1215)    Followed by  0.9 %  sodium chloride infusion (0 mLs Intravenous Stopped 09/03/16 1310)  aspirin chewable tablet 324 mg (324 mg Oral Given 09/03/16 1133)  iopamidol (ISOVUE-370) 76 % injection 75 mL (75 mLs Intravenous Contrast Given 09/03/16 1228)   CRITICAL CARE Performed by: Mckala Pantaleon L Total critical care time: 45 minutes Critical care time was exclusive of separately billable procedures and treating other patients. Critical care was necessary to treat or prevent imminent or life-threatening deterioration. Critical care was time spent personally by me on the following activities: development of treatment plan with patient and/or surrogate as well as nursing, discussions with consultants, evaluation of patient's response to treatment, examination of patient, obtaining history from patient or surrogate, ordering and performing treatments and interventions, ordering and review of laboratory studies, ordering and review of radiographic studies,  pulse oximetry and re-evaluation of patient's condition.   Initial Impression / Assessment and Plan / ED Course  I have reviewed the triage vital signs and the nursing notes.  Pertinent labs & imaging results that were available during my care of the patient were reviewed by me and considered in my medical decision making (see chart for details).   patient was seen by the telemetry neurologist. It was decided to give the patient TPA for acute stroke. After the patient received the TPA her stroke scale had improved to 3 from previously a 6. Patient will be transferred to [facility]    Final Clinical Impressions(s) / ED Diagnoses   Final diagnoses:  Acute CVA (cerebrovascular accident) [facility])    New Prescriptions New Prescriptions   No medications on file     Bethann Berkshire, MD 09/03/16 1353

## 2016-09-03 NOTE — ED Triage Notes (Signed)
Pt incarcerated.  Reports she at 9am she started having numbness on left side of her body while sitting on commode.  Says numbness, "worked its way up."  Speech slurred.  Denies any pain.

## 2016-09-03 NOTE — H&P (Addendum)
Neurology Consultation  CC: Slurred speech, numbness on the left face and arm.  History is obtained from: Chart review and patient  HPI: Alice [name] is a 51 y.o. 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 for evaluation of slurred speech and numbness on the left side of face and arm that started at 9 AM this morning. She says she was having difficulty with her speech is in her speech becoming thick and slurred, no word finding difficulty. She also reported difficulty walking. She said that she could feel the numbness starting in her lower extremities and then going on for upper extremity than to involve her face. She did feel that her face was drooping on the left side. At [facility], she was evaluated by telemedicine neurology, who gave her a stroke scale of 6. She received TPA at [facility], after which her stroke scale improved to 3. She was then transferred to [facility] for further workup and higher level of care. Denies preceding illnesses. Denies weight loss, fevers chills. Denies chest pain shortness of breath. Denies nausea or vomiting. Denies headache double vision. Denies using drugs. Denies smoking.  LKW: 0900 hrs. on 09/03/2016 tpa given?: Yes Premorbid modified Rankin scale (mRS):0  ROS: A 14 point ROS was performed and is negative except as noted in the HPI.    Past Medical History:  Diagnosis Date  . Anemia   . ANEMIA 12/05/2009   Qualifier: Diagnosis of  By: Zachary George    . Chronic low back pain   . COPD (chronic obstructive pulmonary disease) (HCC)   . Heart murmur   . Hypertension   . Stroke [facility])     Family History  Problem Relation Age of Onset  . Hypertension Mother   . Cancer Father     Social History:   reports that she has been smoking.  She has been smoking about 0.50 packs per day. She has  never used smokeless tobacco. She reports that she does not drink alcohol or use drugs.  Abuse cocaine in the past. Smoked in the past. Currently in prison not smoking and not abusing drugs.  Medications  Current Facility-Administered Medications:  .   stroke: mapping our early stages of recovery book, , Does not apply, Once, Milon Dikes, MD .  0.9 %  sodium chloride infusion, , Intravenous, Continuous, Milon Dikes, MD .  acetaminophen (TYLENOL) tablet 650 mg, 650 mg, Oral, Q4H PRN **OR** acetaminophen (TYLENOL) solution 650 mg, 650 mg, Per Tube, Q4H PRN **OR** acetaminophen (TYLENOL) suppository 650 mg, 650 mg, Rectal, Q4H PRN, Milon Dikes, MD .  senna-docusate (Senokot-S) tablet 1 tablet, 1 tablet, Oral, QHS PRN, Milon Dikes, MD  Exam: Current vital signs: BP (!) 137/95 (BP Location: Left Arm)   Pulse 72   Temp 98.8 F (37.1 C) (Oral)   Resp 17   Ht 6' (1.829 m)   Wt 68.5 kg (151 lb 0.2 oz)   LMP 07/30/2011   SpO2 100%   BMI 20.48 kg/m  Vital signs in last 24 hours: Temp:  [98.5 F (36.9 C)-99.3 F (37.4 C)] 98.8 F (37.1 C) (08/24 1730) Pulse Rate:  [72-98] 72 (08/24 1730) Resp:  [13-24] 17 (08/24 1730) BP: (94-146)/(58-103) 137/95 (08/24 1730) SpO2:  [98 %-100 %] 100 % (08/24 1730) Weight:  [68.4 kg (150 lb 14.4 oz)-68.5 kg (151 lb 0.2 oz)] 68.5  kg (151 lb 0.2 oz) (08/24 1730)  GENERAL: Awake, alert in NAD HEENT: - Normocephalic and atraumatic, dry mm, no LN++, no Thyromegally LUNGS - Clear to auscultation bilaterally with no wheezes CV - S1S2 RRR, no m/r/g, equal pulses bilaterally. ABDOMEN - Soft, nontender, nondistended with normoactive BS Ext: warm, well perfused, intact peripheral pulses, no edema  NEURO:  Mental Status: AA&Ox3  Language: speech is clear.  Naming, repetition, fluency, and comprehension intact Cranial Nerves: PERRL 72mm/brisk. EOMI, visual fields full, no facial asymmetry facial sensation intact, hearing intact, tongue/uvula/soft palate  midline, normal sternocleidomastoid and trapezius muscle strength. No evidence of tongue atrophy or fibrillations Motor: No vertical drift in any extremity. Subtle 4+/5 strength in the left upper extremity at the bicep and tricep. Rest of the muscle groups in both extremities 5/5. Tone: is normal and bulk is normal Sensation- Intact to light touch bilaterally Coordination: FTN intact bilaterally, no ataxia in BLE. Gait- deferred  NIHSS - 0   Labs I have reviewed labs in epic and the results pertinent to this consultation are:  CBC    Component Value Date/Time   WBC 6.8 09/03/2016 1025   RBC 3.90 09/03/2016 1025   HGB 11.9 (L) 09/03/2016 1037   HCT 35.0 (L) 09/03/2016 1037   PLT 174 09/03/2016 1025   MCV 92.3 09/03/2016 1025   MCH 31.0 09/03/2016 1025   MCHC 33.6 09/03/2016 1025   RDW 13.0 09/03/2016 1025   LYMPHSABS 2.1 09/03/2016 1025   MONOABS 0.6 09/03/2016 1025   EOSABS 0.2 09/03/2016 1025   BASOSABS 0.0 09/03/2016 1025    CMP     Component Value Date/Time   NA 143 09/03/2016 1037   K 4.0 09/03/2016 1037   CL 102 09/03/2016 1037   CO2 30 09/03/2016 1025   GLUCOSE 87 09/03/2016 1037   BUN 16 09/03/2016 1037   CREATININE 0.70 09/03/2016 1037   CALCIUM 8.9 09/03/2016 1025   PROT 6.9 09/03/2016 1025   ALBUMIN 3.9 09/03/2016 1025   AST 17 09/03/2016 1025   ALT 11 (L) 09/03/2016 1025   ALKPHOS 86 09/03/2016 1025   BILITOT 0.4 09/03/2016 1025   GFRNONAA >60 09/03/2016 1025   GFRAA >60 09/03/2016 1025    Lipid Panel     Component Value Date/Time   CHOL 165 10/04/2015 0225   TRIG 68 10/04/2015 0225   HDL 36 (L) 10/04/2015 0225   CHOLHDL 4.6 10/04/2015 0225   VLDL 14 10/04/2015 0225   LDLCALC 115 (H) 10/04/2015 0225     Imaging I have reviewed the images obtained:  CT-scan of the brain - showed no acute changes. CT angiogram of the head and neck-showed no evidence of large vessel occlusion  MRI examination of the brain-pending  Assessment:  This is a  51 year old woman was a past medical history of a brainstem stroke, possibly as a result of cocaine abuse and hypertension, who is now in prison and had sudden onset of slurred speech and left-sided weakness, for which she was taken to [facility] for evaluation. She was seen by telemedicine neurology, and was given IV TPA. Her stroke scale improved from 6 to a 3 after receiving TPA. She was sent over to [facility] for higher level of care after receiving IV TPA. This could've been a stroke that improved after administration of TPA versus a TIA versus conversion.  Plan: Acute Ischemic Stroke/TIA  Acuity: Acute Current Suspected Etiology:  Continue Evaluation:  -Admit to: Neurological ICU -Hold Aspirin until 24  hour post tPA neuroimaging is stable and without evidence of bleeding -Blood pressure control, goal of SYS <180 -MRI/ECHO/A1C/Lipid panel. -Hyperglycemia management per SSI to maintain glucose 140-180mg /dL. -PT/OT/ST therapies and recommendations when able  CNS -Close neuro monitoring  Dysarthria Dysphagia following cerebral infarction  -cleared Bedside swallow at [facility]. Regular diet -ST  Hemiplegia and hemiparesis following cerebral infarction affecting left non-dominant side  -PT/OT  RESP No active issues. Monitor clinically  H/o COPD -Added PRN proventil  CV History of hypertension -Cardene ordered if needed to maintain systolic less than 180  Hyperlipidemia, unspecified  - Statin for goal LDL < 70  HEME Iron Deficiency Anemia -Monitor -transfuse for hgb < 7  Coagulopathy secondary to tPA at outside hospital -PT/INR in the morning  ENDO No active issues -Check A1c -goal HgbA1c < 7  GI/GU - no active issues -Gentle hydration  Fluid/Electrolyte Disorders -No active issues -BMP in the a.m. -Replete as necessary  ID No active issues  Possible UTI -Check UA  Prophylaxis DVT: SCD   GI: NA Bowel: doc/senna   Diet:  regular  Code Status: Full Code  Comfort Measures DNR   THE FOLLOWING WERE PRESENT ON ADMISSION: CNS -  Acute Ischemic Stroke,  Cardiovascular - HTN Infectious - possible UTI  TPA was given at [facility]. TIME PT LAST SEEN NORMAL 0900 hrs. on 09/03/2016 IV tPA bolus (time and dose)  1115 hrs. on 09/03/2016-total dose 62 mg, out of that bolus 6.2.  -- Milon Dikes, MD [facility] [phone]  If 7pm to 7am, please call on call as listed on AMION.  Critical care attestation This patient is critically ill and at significant risk of neurological worsening, death and care requires constant monitoring of vital signs, hemodynamics,respiratory and cardiac monitoring, neurological assessment, discussion with family, other specialists and medical decision making of high complexity. I spent 50  minutes of neurocritical care time  in the care of  this patient

## 2016-09-03 NOTE — Progress Notes (Signed)
eLink Physician-Brief Progress Note Patient Name: Alice [name] DOB: [birth year] MRN: [id number]   Date of Service  09/03/2016  HPI/Events of Note  New patient transferred after TPA. Camera shows patient sitting up talking to family/friends at bedside. Contacted bedside nurse & NIH stroke scale 0. Neurology has reportedly assessed the patient.   eICU Interventions  Plan of care is yet to be determined by neurology. Continuing close ICU monitoring.      Intervention Category Evaluation Type: New Patient Evaluation  Lawanda Cousins 09/03/2016, 6:15 PM

## 2016-09-03 NOTE — ED Notes (Signed)
EDP at bedside  

## 2016-09-04 ENCOUNTER — Other Ambulatory Visit (HOSPITAL_COMMUNITY): Payer: Self-pay

## 2016-09-04 ENCOUNTER — Inpatient Hospital Stay (HOSPITAL_COMMUNITY)

## 2016-09-04 ENCOUNTER — Ambulatory Visit (HOSPITAL_COMMUNITY)
Admission: RE | Admit: 2016-09-04 | Discharge: 2016-09-04 | Disposition: A | Discharge status: Home / Self Care | Source: Ambulatory Visit | Attending: Student in an Organized Health Care Education/Training Program | Admitting: Student in an Organized Health Care Education/Training Program

## 2016-09-04 DIAGNOSIS — I639 Cerebral infarction, unspecified: Secondary | ICD-10-CM

## 2016-09-04 LAB — HEMOGLOBIN A1C
HEMOGLOBIN A1C: 5.4 % (ref 4.8–5.6)
MEAN PLASMA GLUCOSE: 108.28 mg/dL

## 2016-09-04 LAB — LIPID PANEL
CHOL/HDL RATIO: 3.2 ratio
Cholesterol: 130 mg/dL (ref 0–200)
HDL: 41 mg/dL (ref >40)
LDL CALC: 58 mg/dL (ref 0–99)
Triglycerides: 156 mg/dL — ABNORMAL HIGH (ref <150)
VLDL: 31 mg/dL (ref 0–40)

## 2016-09-04 MED ORDER — LORAZEPAM 1 MG PO TABS
1.0000 mg | ORAL_TABLET | Freq: Once | ORAL | Status: AC | PRN
  Administered 2016-09-04: 1 mg via ORAL
  Filled 2016-09-04: qty 1

## 2016-09-04 MED ORDER — LORAZEPAM 2 MG/ML IJ SOLN
1.0000 mg | Freq: Once | INTRAMUSCULAR | Status: AC
  Administered 2016-09-04: 1 mg via INTRAVENOUS
  Filled 2016-09-04: qty 1

## 2016-09-04 MED ORDER — ASPIRIN EC 325 MG PO TBEC
325.0000 mg | DELAYED_RELEASE_TABLET | Freq: Every day | ORAL | Status: DC
  Administered 2016-09-04 – 2016-09-05 (×2): 325 mg via ORAL
  Filled 2016-09-04 (×2): qty 1

## 2016-09-04 MED ORDER — LIDOCAINE 5 % EX PTCH
1.0000 | MEDICATED_PATCH | CUTANEOUS | Status: DC
  Administered 2016-09-04 – 2016-09-05 (×2): 1 via TRANSDERMAL
  Filled 2016-09-04 (×3): qty 1

## 2016-09-04 NOTE — Progress Notes (Signed)
PT Cancellation Note  Patient Details Name: Alice [name] MRN: [id number] DOB: 1965/03/27   Cancelled Treatment:    Reason Eval/Treat Not Completed: Patient not medically ready. Pt currently with active bedrest orders. Will continue to follow and initiate PT eval when increased activity orders are received. Thank you   Marylynn Pearson 09/04/2016, 8:53 AM   Conni Slipper, PT, DPT Acute Rehabilitation Services Pager: [phone]

## 2016-09-04 NOTE — Progress Notes (Signed)
OT Cancellation Note  Patient Details Name: Alice [name] MRN: [id number] DOB: 21-Sep-1965   Cancelled Treatment:    Reason Eval/Treat Not Completed: Patient not medically ready (active bedrest orders). Will follow up as time allows.  Gaye Alken M.S., OTR/L Pager: [phone]  09/04/2016, 7:56 AM

## 2016-09-04 NOTE — Progress Notes (Signed)
STROKE TEAM PROGRESS NOTE   HISTORY OF PRESENT ILLNESS (per record) Alice [name] is a 51 y.o. 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 She says she was having difficulty with her speech is in her speech becoming thick and slurred, no word finding difficulty. She also reported difficulty walking. She said that she could feel the numbness starting in her lower extremities and then going on for upper extremity than to involve her face. She did feel that her face was drooping on the left side. At [facility], she was evaluated by telemedicine neurology, who gave her a stroke scale of 6. She received TPA at [facility], after which her stroke scale improved to 3. She was then transferred to [facility] for further workup and higher level of care. Denies preceding illnesses. Denies weight loss, fevers chills. Denies chest pain shortness of breath. Denies nausea or vomiting. Denies headache double vision. Denies using drugs. Denies smoking.  LKW: 0900 hrs. on 09/03/2016 tpa given?: Yes Premorbid modified Rankin scale (mRS):0   SUBJECTIVE (INTERVAL HISTORY) No family at bedside. Dr Pearlean Brownie discussed patient's symptoms and TPA protocol. Pt requested ativan for MRI. Smoking cessation discussed. Pt C/o low back pain -> tylenol and Lidocaine patch.   OBJECTIVE Temp:  [98.4 F (36.9 C)-99.3 F (37.4 C)] 98.6 F (37 C) (08/25 0800) Pulse Rate:  [72-98] 75 (08/24 1830) Cardiac Rhythm: Normal sinus rhythm (08/25 0800) Resp:  [13-24] 19 (08/25 0900) BP: (94-159)/(58-104) 121/79 (08/25 0900) SpO2:  [97 %-100 %] 99 % (08/25 0900) Weight:  [150 lb 14.4 oz (68.4 kg)-151 lb 0.2 oz (68.5 kg)] 151 lb 0.2 oz (68.5 kg) (08/24 1730)  CBC:    Recent Labs Lab 09/03/16 1025 09/03/16 1037  WBC 6.8  --   NEUTROABS 3.9  --   HGB 12.1 11.9*  HCT 36.0 35.0*  MCV 92.3  --   PLT 174  --     Basic Metabolic Panel:   Recent Labs Lab 09/03/16 1025 09/03/16 1037  NA 140 143  K 3.9 4.0  CL 104 102  CO2 30  --   GLUCOSE 90 87  BUN 15 16  CREATININE 0.65 0.70  CALCIUM 8.9  --     Lipid Panel:     Component Value Date/Time   CHOL 165 10/04/2015 0225   TRIG 68 10/04/2015 0225   HDL 36 (L) 10/04/2015 0225   CHOLHDL 4.6 10/04/2015 0225   VLDL 14 10/04/2015 0225   LDLCALC 115 (H) 10/04/2015 0225   HgbA1c:  Lab Results  Component Value Date   HGBA1C 5.5 10/04/2015   Urine Drug Screen:     Component Value Date/Time   LABOPIA POSITIVE (A) 10/03/2015 1941   COCAINSCRNUR POSITIVE (A) 10/03/2015 1941   LABBENZ NONE DETECTED 10/03/2015 1941   AMPHETMU NONE DETECTED 10/03/2015 1941   THCU NONE DETECTED 10/03/2015 1941   LABBARB NONE DETECTED 10/03/2015 1941    Alcohol Level     Component Value Date/Time   ETH <5 09/03/2016 1025    IMAGING  Ct Angio Head W Or Wo Contrast Ct Angio Neck W And/or Wo Contrast 09/03/2016 1. No acute arterial finding.  No emergent large vessel occlusion.  2. Mild atherosclerosis.  Negative for stenosis.  Dg Chest Port 1 View 09/03/2016 No acute abnormality.    Ct Head Code Stroke Wo Contrast 09/03/2016 1. No acute finding by CT. Old small vessel ischemic changes of the pons. A recent insult could be hidden within these chronic changes.  2. ASPECTS is 10.    Transthoracic Echocardiogram - pending      PHYSICAL EXAM  Vitals:   09/04/16 0630 09/04/16 0700 09/04/16 0800 09/04/16 0900  BP: 115/85 115/89 119/80 121/79  Pulse:      Resp: 19 (!) 22 19 19   Temp:   98.6 F (37 C)   TempSrc:   Oral   SpO2: 98% 99% 98% 99%  Weight:      Height:       Pleasant middle-age Caucasian lady currently not in distress. . Afebrile. Head is nontraumatic. Neck is supple  without bruit.    Cardiac exam no murmur or gallop. Lungs are clear to auscultation. Distal pulses are well felt. Neurological Exam l   Awake alert oriented x 3 normal speech and language. Mild left lower face asymmetry. Tongue midline. No drift. Mild diminished fine finger movements on left. Orbits right over left upper extremity. Mild left grip weak.. Normal sensation . Normal coordination.   ASSESSMENT/PLAN [name] is a 51 y.o. female with history of previous stroke, hypertension, COPD with ongoing tobacco use, chronic low back pain, anemia, and cocaine abuse presenting with slurred speech with left-sided numbness. She received IV TPA at [facility] Friday, 09/03/2016 at 11:15 AM.  Suspected Stroke vs TIA:    Resultant  mild dysarthria and left facial weakness  CT head - no acute findings  CTA H&N - Unremarkable  MRI head - pending  MRA head - pending  Carotid Doppler - CTA neck normal -> cancelled  2D Echo - pending  LDL - will order  HgbA1c - will order  VTE prophylaxis - SCDs Diet regular Room service appropriate? Yes; Fluid consistency: Thin  aspirin 81 mg daily prior to admission, now on No antithrombotic secondary to TPA therapy.  Patient counseled to be compliant with her antithrombotic medications  Ongoing aggressive stroke risk factor management  Therapy recommendations: pending  Disposition:  Pending   Hypertension  Stable  Permissive hypertension (OK if < 220/120) but gradually normalize in 5-7 days  Long-term BP goal normotensive  Hyperlipidemia  Home meds:  Lipitor 20 mg daily resumed in hospital  LDL pending, goal < 70     Other Stroke Risk Factors  Cigarette smoker - advised to stop smoking  Hx stroke/TIA   Other Active Problems  Cocaine history - UDS pending  Low back pain - Tylenol and lidocaine patch  Claustrophobia - Ativan for MRI  Mild anemia  Currently in prison  [name]  I have  personally examined this patient, reviewed notes, independently viewed imaging studies, participated in medical decision making and plan of care.ROS completed by me personally and pertinent positives fully documented  I have made any additions or clarifications directly to the above note.  She presented to outside hospital with sudden onset of slurred speech and left-sided weakness and was given IV tPA uneventfully. She's done well and has only minimum residual deficits. Continue close neurological monitoring and strict blood pressure control as per post TPA protocol. Patient counseled to quit smoking and to be compliant with blood pressure and cholesterol medications. Check MRI scan of the brain dated today and start aspirin after that. No family available at the bedside.This  patient is critically ill and at significant risk of neurological worsening, death and care requires constant monitoring of vital signs, hemodynamics,respiratory and cardiac monitoring, extensive review of multiple databases, frequent neurological assessment, discussion with family, other specialists and medical decision making of high complexity.I have made any additions or clarifications directly to the above note.This critical care time does not reflect procedure time, or teaching time or supervisory time of PA/NP/Med Resident etc but could involve care discussion time.  I spent 30 minutes of neurocritical care time  in the care of  this patient.     Delia Heady, MD Medical Director [facility] Pager: [phone] 09/04/2016 12:34 PM   To contact Stroke Continuity provider, please refer to [email or web address]. After hours, contact General Neurology
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
## 2016-09-04 NOTE — Progress Notes (Signed)
Notified Neurologist regarding delay on MRI.  Orders received to wait for MRI to be done.  Will continue to monitor patient.

## 2016-09-05 ENCOUNTER — Inpatient Hospital Stay (HOSPITAL_COMMUNITY)

## 2016-09-05 MED ORDER — ASPIRIN 325 MG PO TBEC: 325.0000 mg | DELAYED_RELEASE_TABLET | Freq: Every day | ORAL | 0 refills | Status: DC

## 2016-09-05 NOTE — Evaluation (Signed)
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 She was seen by telemedicine neurology, and was given IV TPA.   Clinical Impression   PTA Pt independent in ADL and mobility with minor dysarthria and RUE ataxia and weakness at baseline. Pt is back at baseline and was observed at modified independent level for ADL and in room mobility without DME. Pt with no questions or concerns for OT at the end of session, no DME needs, OT to sign off at this time. Thank you for the opportunity to serve this patient.     Follow Up Recommendations  No OT follow up    Equipment Recommendations  None recommended by OT    Recommendations for Other Services       Precautions / Restrictions Precautions Precautions: Fall Restrictions Weight Bearing Restrictions: No      Mobility Bed Mobility Overal bed mobility: Modified Independent                Transfers Overall transfer level: Modified independent Equipment used: None             General transfer comment: increased time, no assist    Balance Overall balance assessment: No apparent balance deficits (not formally assessed)                                         ADL either performed or assessed with clinical judgement   ADL Overall ADL's : Modified independent                                       General ADL Comments: Able to get in and out of bed, navigate tight area in room requiring side stepping, toilet transfer, sink level grooming (including oral care), LB dressing, etc. no LOB or problems  with completion     Vision Patient Visual Report: No change from baseline Vision Assessment?: Yes Eye Alignment: Within Functional Limits Ocular Range of Motion: Within Functional Limits Alignment/Gaze Preference: Within Defined Limits Tracking/Visual Pursuits: Able to track stimulus in all quads without difficulty Convergence: Within functional limits Visual Fields: No apparent deficits Depth Perception:  [facility])     Perception     Praxis      Pertinent Vitals/Pain Pain Assessment: No/denies pain     Hand Dominance Right   Extremity/Trunk Assessment Upper Extremity Assessment Upper Extremity Assessment: Overall WFL for tasks assessed (baseline deficits in RUE, LUE is [facility])   Lower Extremity Assessment Lower Extremity Assessment: Overall WFL for tasks assessed   Cervical / Trunk Assessment Cervical / Trunk Assessment: Normal   Communication Communication Communication: No difficulties   Cognition Arousal/Alertness: Awake/alert Behavior During Therapy: WFL for tasks assessed/performed Overall Cognitive Status: Within Functional Limits for tasks assessed                                 General Comments: Pt is  anxious to go home   General Comments  Pt's partner/significant other sleeping on couch during session    Exercises     Shoulder Instructions      Home Living Family/patient expects to be discharged to:: Private residence Living Arrangements: Other (Comment) (partner/significant other) Available Help at Discharge: Family;Available 24 hours/day;Friend(s) Type of Home: House Home Access: Stairs to enter [employer] of Steps: 3 Entrance Stairs-Rails: None Home Layout: One level     Bathroom Shower/Tub: Producer, television/film/video: Handicapped height     Home Equipment: Cane - single point;Bedside commode;Shower seat - built in;Hand held shower head;Hospital bed   Additional Comments: was in prison, will go home at [employer] With: Significant other    Prior Functioning/Environment Level of Independence: Independent        Comments: PTA RUE deficits from previous stroke, ataxia noted during session        OT Problem List:        OT Treatment/Interventions:      OT Goals(Current goals can be found in the care plan section) Acute Rehab OT Goals Patient Stated Goal: to get home OT Goal Formulation: With patient Time For Goal Achievement: 09/10/16 Potential to Achieve Goals: Good  OT Frequency:     Barriers to D/C:            Co-evaluation              AM-PAC PT "6 Clicks" Daily Activity     Outcome Measure Help from another person eating meals?: None Help from another person taking care of personal grooming?: None Help from another person toileting, which includes using toliet, bedpan, or urinal?: None Help from another person bathing (including washing, rinsing, drying)?: None Help from another person to put on and taking off regular upper body clothing?: None Help from another person to put on and taking off regular lower body clothing?: None 6 Click Score: 24   End of Session Nurse Communication: Mobility status  Activity Tolerance: Patient tolerated treatment well Patient left: in bed;with call bell/phone within reach;with family/visitor present                   Time: 1610-9604 OT Time Calculation (min): 16 min Charges:  OT General Charges $OT Visit: 1 Procedure OT Evaluation $OT Eval Low Complexity: 1 Procedure G-Codes:     Sherryl Manges OTR/L [phone]  Evern Bio Lennard Capek 09/05/2016, 10:48 AM

## 2016-09-05 NOTE — Progress Notes (Signed)
D/C instructions provided to patient and significant other, denies questions/concerns at this time. Patient transported to front entrance via WC, tol well.

## 2016-09-05 NOTE — Progress Notes (Signed)
OT Cancellation Note  Patient Details Name: Niharika Novakowski MRN: [id number] DOB: 02-Feb-1965   Cancelled Treatment:    Reason Eval/Treat Not Completed: Patient not medically ready (active bedrest orders). OT will continue to follow and will eval once activity orders are updated.  Evern Bio Paighton Godette 09/05/2016, 7:46 AM

## 2016-09-05 NOTE — Evaluation (Signed)
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 She was seen by telemedicine neurology, and was given IV TPA.  Clinical Impression  Seen for assessment, mobilizing well, no acute needs. Safe for d/c home. Will sign off.    Follow Up Recommendations No PT follow up    Equipment Recommendations  None recommended by PT    Recommendations for Other Services       Precautions / Restrictions Precautions Precautions: Fall Restrictions Weight Bearing Restrictions: No      Mobility  Bed Mobility Overal bed mobility: Modified Independent                Transfers Overall transfer level: Modified independent Equipment used: None             General transfer comment: increased time, no assist  Ambulation/Gait Ambulation/Gait assistance: Independent Ambulation Distance (Feet): 310 Feet Assistive device: None Gait Pattern/deviations: WFL(Within Functional Limits)   Gait velocity interpretation: at or above normal speed for age/gender General Gait Details: modest instability RLE at baseline but overall functional  Stairs Stairs: Yes Stairs assistance: Modified independent (Device/Increase time) Stair Management: Forwards Number of Stairs: 4 General stair comments: no difficulty  Wheelchair Mobility    Modified Rankin (Stroke Patients Only)       Balance Overall balance assessment: No apparent balance deficits (not formally assessed)                               Standardized Balance Assessment Standardized Balance Assessment : Dynamic Gait Index   Dynamic Gait  Index Level Surface: Normal Change in Gait Speed: Normal Gait with Horizontal Head Turns: Normal Gait with Vertical Head Turns: Normal Gait and Pivot Turn: Normal Step Over Obstacle: Mild Impairment Step Around Obstacles: Normal Steps: Mild Impairment Total Score: 22       Pertinent Vitals/Pain Pain Assessment: No/denies pain    Home Living Family/patient expects to be discharged to:: Private residence Living Arrangements: Other (Comment) (partner/significant other) Available Help at Discharge: Family;Available 24 hours/day;Friend(s) Type of Home: House Home Access: Stairs to enter Entrance Stairs-Rails: None Entrance Stairs-Number of Steps: 3 Home Layout: One level Home Equipment: Cane - single point;Bedside commode;Shower seat - built in;Hand held shower head;Hospital bed Additional Comments: was in prison, will go home at [employer]    Prior Function Level of Independence: Independent         Comments: PTA RUE deficits from previous stroke, ataxia noted during session     Hand Dominance   Dominant Hand: Right    Extremity/Trunk Assessment   Upper Extremity Assessment Upper Extremity Assessment: Overall WFL for tasks assessed (baseline deficits in RUE, LUE is [facility])    Lower Extremity Assessment Lower Extremity Assessment: Overall WFL for tasks assessed    Cervical / Trunk Assessment Cervical / Trunk Assessment: Normal  Communication   Communication: No difficulties  Cognition Arousal/Alertness: Awake/alert Behavior During Therapy: WFL for tasks assessed/performed Overall Cognitive Status: Within Functional Limits for tasks assessed  General Comments: Pt is anxious to go home      General Comments General comments (skin integrity, edema, etc.): steady with all activity    Exercises     Assessment/Plan    PT Assessment Patent does not need any further PT services  PT Problem List         PT Treatment  Interventions      PT Goals (Current goals can be found in the Care Plan section)  Acute Rehab PT Goals Patient Stated Goal: to get home PT Goal Formulation: All assessment and education complete, DC therapy    Frequency     Barriers to discharge        Co-evaluation               AM-PAC PT "6 Clicks" Daily Activity  Outcome Measure Difficulty turning over in bed (including adjusting bedclothes, sheets and blankets)?: None Difficulty moving from lying on back to sitting on the side of the bed? : None Difficulty sitting down on and standing up from a chair with arms (e.g., wheelchair, bedside commode, etc,.)?: None Help needed moving to and from a bed to chair (including a wheelchair)?: None Help needed walking in hospital room?: None Help needed climbing 3-5 steps with a railing? : None 6 Click Score: 24    End of Session Equipment Utilized During Treatment: Gait belt Activity Tolerance: Patient tolerated treatment well Patient left: in chair Nurse Communication: Mobility status PT Visit Diagnosis: Other symptoms and signs involving the nervous system (R29.898)    Time: 1610-9604 PT Time Calculation (min) (ACUTE ONLY): 16 min   Charges:   PT Evaluation $PT Eval Low Complexity: 1 Low     PT G Codes:        Charlotte Crumb, PT DPT  Board Certified Neurologic Specialist [phone]   Fabio Asa 09/05/2016, 12:53 PM
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
## 2016-09-05 NOTE — Discharge Instructions (Signed)
Stop smoking

## 2016-09-06 LAB — URINE DRUGS OF ABUSE SCREEN W ALC, ROUTINE (REF LAB)
Amphetamines, Urine: NEGATIVE ng/mL
BARBITURATE, UR: NEGATIVE ng/mL
BENZODIAZEPINE QUANT UR: NEGATIVE ng/mL
COCAINE (METAB.): NEGATIVE ng/mL
Cannabinoid Quant, Ur: NEGATIVE ng/mL
Ethanol U, Quan: NEGATIVE %
Methadone Screen, Urine: NEGATIVE ng/mL
OPIATE QUANT UR: NEGATIVE ng/mL
PROPOXYPHENE, URINE: NEGATIVE ng/mL
Phencyclidine, Ur: NEGATIVE ng/mL

## 2016-09-08 ENCOUNTER — Emergency Department (HOSPITAL_COMMUNITY): Payer: Self-pay

## 2016-09-08 ENCOUNTER — Observation Stay (HOSPITAL_COMMUNITY)
Admission: EM | Admit: 2016-09-08 | Discharge: 2016-09-09 | Disposition: A | Discharge status: Home / Self Care | Payer: Self-pay | Attending: Family Medicine | Admitting: Family Medicine

## 2016-09-08 ENCOUNTER — Encounter (HOSPITAL_COMMUNITY): Payer: Self-pay

## 2016-09-08 DIAGNOSIS — R270 Ataxia, unspecified: Secondary | ICD-10-CM

## 2016-09-08 DIAGNOSIS — Z7982 Long term (current) use of aspirin: Secondary | ICD-10-CM | POA: Insufficient documentation

## 2016-09-08 DIAGNOSIS — F1910 Other psychoactive substance abuse, uncomplicated: Secondary | ICD-10-CM | POA: Diagnosis present

## 2016-09-08 DIAGNOSIS — R471 Dysarthria and anarthria: Principal | ICD-10-CM | POA: Diagnosis present

## 2016-09-08 DIAGNOSIS — I10 Essential (primary) hypertension: Secondary | ICD-10-CM | POA: Diagnosis present

## 2016-09-08 DIAGNOSIS — Z79899 Other long term (current) drug therapy: Secondary | ICD-10-CM | POA: Insufficient documentation

## 2016-09-08 DIAGNOSIS — Z8673 Personal history of transient ischemic attack (TIA), and cerebral infarction without residual deficits: Secondary | ICD-10-CM | POA: Insufficient documentation

## 2016-09-08 DIAGNOSIS — R269 Unspecified abnormalities of gait and mobility: Secondary | ICD-10-CM

## 2016-09-08 DIAGNOSIS — J449 Chronic obstructive pulmonary disease, unspecified: Secondary | ICD-10-CM | POA: Insufficient documentation

## 2016-09-08 DIAGNOSIS — F1410 Cocaine abuse, uncomplicated: Secondary | ICD-10-CM

## 2016-09-08 DIAGNOSIS — F17210 Nicotine dependence, cigarettes, uncomplicated: Secondary | ICD-10-CM | POA: Insufficient documentation

## 2016-09-08 DIAGNOSIS — M6281 Muscle weakness (generalized): Secondary | ICD-10-CM | POA: Insufficient documentation

## 2016-09-08 DIAGNOSIS — J441 Chronic obstructive pulmonary disease with (acute) exacerbation: Secondary | ICD-10-CM | POA: Diagnosis present

## 2016-09-08 HISTORY — DX: Hyperlipidemia, unspecified: E78.5

## 2016-09-08 HISTORY — DX: Cocaine abuse, uncomplicated: F14.10

## 2016-09-08 LAB — I-STAT CHEM 8, ED
BUN: 14 mg/dL (ref 6–20)
CALCIUM ION: 1.13 mmol/L — AB (ref 1.15–1.40)
Chloride: 102 mmol/L (ref 101–111)
Creatinine, Ser: 0.6 mg/dL (ref 0.44–1.00)
Glucose, Bld: 106 mg/dL — ABNORMAL HIGH (ref 65–99)
HEMATOCRIT: 38 % (ref 36.0–46.0)
HEMOGLOBIN: 12.9 g/dL (ref 12.0–15.0)
Potassium: 4.3 mmol/L (ref 3.5–5.1)
SODIUM: 141 mmol/L (ref 135–145)
TCO2: 29 mmol/L (ref 22–32)

## 2016-09-08 LAB — CBC
HCT: 38.2 % (ref 36.0–46.0)
HEMOGLOBIN: 12.9 g/dL (ref 12.0–15.0)
MCH: 31.5 pg (ref 26.0–34.0)
MCHC: 33.8 g/dL (ref 30.0–36.0)
MCV: 93.2 fL (ref 78.0–100.0)
Platelets: 193 10*3/uL (ref 150–400)
RBC: 4.1 MIL/uL (ref 3.87–5.11)
RDW: 13.3 % (ref 11.5–15.5)
WBC: 6.2 10*3/uL (ref 4.0–10.5)

## 2016-09-08 LAB — DIFFERENTIAL
Basophils Absolute: 0 10*3/uL (ref 0.0–0.1)
Basophils Relative: 1 %
EOS ABS: 0.3 10*3/uL (ref 0.0–0.7)
EOS PCT: 5 %
LYMPHS ABS: 1.8 10*3/uL (ref 0.7–4.0)
LYMPHS PCT: 29 %
MONO ABS: 0.5 10*3/uL (ref 0.1–1.0)
Monocytes Relative: 8 %
NEUTROS PCT: 57 %
Neutro Abs: 3.6 10*3/uL (ref 1.7–7.7)

## 2016-09-08 LAB — RAPID URINE DRUG SCREEN, HOSP PERFORMED
Amphetamines: NOT DETECTED
Barbiturates: NOT DETECTED
Benzodiazepines: NOT DETECTED
COCAINE: POSITIVE — AB
OPIATES: NOT DETECTED
TETRAHYDROCANNABINOL: NOT DETECTED

## 2016-09-08 LAB — COMPREHENSIVE METABOLIC PANEL
ALBUMIN: 4.1 g/dL (ref 3.5–5.0)
ALK PHOS: 75 U/L (ref 38–126)
ALT: 13 U/L — ABNORMAL LOW (ref 14–54)
ANION GAP: 7 (ref 5–15)
AST: 21 U/L (ref 15–41)
BILIRUBIN TOTAL: 0.4 mg/dL (ref 0.3–1.2)
BUN: 14 mg/dL (ref 6–20)
CALCIUM: 9.3 mg/dL (ref 8.9–10.3)
CO2: 30 mmol/L (ref 22–32)
Chloride: 101 mmol/L (ref 101–111)
Creatinine, Ser: 0.7 mg/dL (ref 0.44–1.00)
GLUCOSE: 107 mg/dL — AB (ref 65–99)
Potassium: 4.3 mmol/L (ref 3.5–5.1)
Sodium: 138 mmol/L (ref 135–145)
TOTAL PROTEIN: 7.9 g/dL (ref 6.5–8.1)

## 2016-09-08 LAB — URINALYSIS, ROUTINE W REFLEX MICROSCOPIC
Bilirubin Urine: NEGATIVE
GLUCOSE, UA: NEGATIVE mg/dL
Hgb urine dipstick: NEGATIVE
KETONES UR: NEGATIVE mg/dL
LEUKOCYTES UA: NEGATIVE
NITRITE: NEGATIVE
PH: 6 (ref 5.0–8.0)
Protein, ur: NEGATIVE mg/dL
SPECIFIC GRAVITY, URINE: 1.017 (ref 1.005–1.030)

## 2016-09-08 LAB — TSH: TSH: 2.214 u[IU]/mL (ref 0.350–4.500)

## 2016-09-08 LAB — APTT: aPTT: 30 seconds (ref 24–36)

## 2016-09-08 LAB — I-STAT TROPONIN, ED: TROPONIN I, POC: 0.01 ng/mL (ref 0.00–0.08)

## 2016-09-08 LAB — PROTIME-INR
INR: 1
PROTHROMBIN TIME: 13 s (ref 11.4–15.2)

## 2016-09-08 LAB — ETHANOL: Alcohol, Ethyl (B): <5 mg/dL (ref <5)

## 2016-09-08 MED ORDER — ATORVASTATIN CALCIUM 20 MG PO TABS
20.0000 mg | ORAL_TABLET | Freq: Every day | ORAL | Status: DC
  Administered 2016-09-09: 20 mg via ORAL
  Filled 2016-09-08: qty 1

## 2016-09-08 MED ORDER — STROKE: EARLY STAGES OF RECOVERY BOOK
Freq: Once | Status: DC
  Filled 2016-09-08: qty 1

## 2016-09-08 MED ORDER — ALBUTEROL SULFATE (2.5 MG/3ML) 0.083% IN NEBU: 2.5000 mg | INHALATION_SOLUTION | RESPIRATORY_TRACT | Status: DC | PRN

## 2016-09-08 MED ORDER — AMITRIPTYLINE HCL 25 MG PO TABS
25.0000 mg | ORAL_TABLET | Freq: Every day | ORAL | Status: DC
  Administered 2016-09-08: 25 mg via ORAL
  Filled 2016-09-08: qty 1

## 2016-09-08 MED ORDER — FLUPHENAZINE HCL 1 MG PO TABS
1.0000 mg | ORAL_TABLET | Freq: Every day | ORAL | Status: DC
  Administered 2016-09-08 – 2016-09-09 (×2): 1 mg via ORAL
  Filled 2016-09-08 (×3): qty 1

## 2016-09-08 MED ORDER — ACETAMINOPHEN 650 MG RE SUPP: 650.0000 mg | RECTAL | Status: DC | PRN

## 2016-09-08 MED ORDER — ACETAMINOPHEN 160 MG/5ML PO SOLN: 650.0000 mg | ORAL | Status: DC | PRN

## 2016-09-08 MED ORDER — FLUPHENAZINE HCL 1 MG PO TABS
1.0000 mg | ORAL_TABLET | Freq: Every day | ORAL | Status: DC
  Filled 2016-09-08 (×2): qty 1

## 2016-09-08 MED ORDER — SENNOSIDES-DOCUSATE SODIUM 8.6-50 MG PO TABS: 1.0000 | ORAL_TABLET | Freq: Every evening | ORAL | Status: DC | PRN

## 2016-09-08 MED ORDER — ENOXAPARIN SODIUM 40 MG/0.4ML ~~LOC~~ SOLN
40.0000 mg | SUBCUTANEOUS | Status: DC
  Administered 2016-09-08: 40 mg via SUBCUTANEOUS
  Filled 2016-09-08: qty 0.4

## 2016-09-08 MED ORDER — ASPIRIN EC 325 MG PO TBEC
325.0000 mg | DELAYED_RELEASE_TABLET | Freq: Every day | ORAL | Status: DC
  Administered 2016-09-09: 325 mg via ORAL
  Filled 2016-09-08: qty 1

## 2016-09-08 MED ORDER — SODIUM CHLORIDE 0.9 % IV SOLN
INTRAVENOUS | Status: DC
  Administered 2016-09-08 – 2016-09-09 (×2): via INTRAVENOUS

## 2016-09-08 MED ORDER — ACETAMINOPHEN 325 MG PO TABS: 650.0000 mg | ORAL_TABLET | ORAL | Status: DC | PRN

## 2016-09-08 MED ORDER — DULOXETINE HCL 60 MG PO CPEP
60.0000 mg | ORAL_CAPSULE | Freq: Every day | ORAL | Status: DC
  Administered 2016-09-09: 60 mg via ORAL
  Filled 2016-09-08: qty 1

## 2016-09-08 MED ORDER — NICOTINE 21 MG/24HR TD PT24
21.0000 mg | MEDICATED_PATCH | Freq: Every day | TRANSDERMAL | Status: DC
  Administered 2016-09-08 – 2016-09-09 (×2): 21 mg via TRANSDERMAL
  Filled 2016-09-08 (×2): qty 1

## 2016-09-08 MED ORDER — TIOTROPIUM BROMIDE MONOHYDRATE 18 MCG IN CAPS
18.0000 ug | ORAL_CAPSULE | Freq: Every day | RESPIRATORY_TRACT | Status: DC
  Administered 2016-09-09: 18 ug via RESPIRATORY_TRACT
  Filled 2016-09-08: qty 5
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
## 2016-09-08 NOTE — ED Notes (Signed)
Ambulated pt to bathroom. Upon walking, pt does sway from side to side when walking, but did not fall or lose balance completely.

## 2016-09-08 NOTE — ED Triage Notes (Signed)
Pt reports slurred speech and "swaying from side to side" when she walks since yesterday.  Reports was admitted for stroke last week and received tpa.
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
## 2016-09-08 NOTE — ED Notes (Signed)
MD at bedside. 

## 2016-09-08 NOTE — ED Notes (Signed)
Pt states still doesn't have to urinate. Gave pt another cup of water and will check back in 15 mns

## 2016-09-08 NOTE — ED Notes (Signed)
Pt to MRI

## 2016-09-08 NOTE — ED Notes (Signed)
Lunch tray ordered for pt per doctor's permission.

## 2016-09-09 ENCOUNTER — Observation Stay (HOSPITAL_BASED_OUTPATIENT_CLINIC_OR_DEPARTMENT_OTHER): Payer: Self-pay

## 2016-09-09 ENCOUNTER — Observation Stay (HOSPITAL_COMMUNITY)
Admit: 2016-09-09 | Discharge: 2016-09-09 | Disposition: A | Discharge status: Home / Self Care | Payer: Self-pay | Attending: Neurology | Admitting: Neurology

## 2016-09-09 DIAGNOSIS — I639 Cerebral infarction, unspecified: Secondary | ICD-10-CM

## 2016-09-09 DIAGNOSIS — I10 Essential (primary) hypertension: Secondary | ICD-10-CM

## 2016-09-09 DIAGNOSIS — R471 Dysarthria and anarthria: Secondary | ICD-10-CM

## 2016-09-09 DIAGNOSIS — F1910 Other psychoactive substance abuse, uncomplicated: Secondary | ICD-10-CM

## 2016-09-09 LAB — C-REACTIVE PROTEIN: CRP: <0.8 mg/dL (ref <1.0)

## 2016-09-09 LAB — ECHOCARDIOGRAM COMPLETE
Height: 72 in
Weight: 2416 oz

## 2016-09-09 LAB — SEDIMENTATION RATE: SED RATE: 16 mm/h (ref 0–22)

## 2016-09-09 LAB — VITAMIN B12: Vitamin B-12: 345 pg/mL (ref 180–914)

## 2016-09-09 MED ORDER — AMITRIPTYLINE HCL 25 MG PO TABS: 25.0000 mg | ORAL_TABLET | Freq: Every day | ORAL | 0 refills | Status: DC

## 2016-09-09 MED ORDER — DULOXETINE HCL 60 MG PO CPEP: 60.0000 mg | ORAL_CAPSULE | Freq: Every day | ORAL | 0 refills | Status: DC

## 2016-09-09 MED ORDER — LISINOPRIL 5 MG PO TABS: 10.0000 mg | ORAL_TABLET | Freq: Every day | ORAL | 0 refills | Status: DC

## 2016-09-09 MED ORDER — TIOTROPIUM BROMIDE MONOHYDRATE 18 MCG IN CAPS: 18.0000 ug | ORAL_CAPSULE | Freq: Every day | RESPIRATORY_TRACT | 0 refills | Status: DC

## 2016-09-09 MED ORDER — HYDROXYZINE HCL 10 MG PO TABS
10.0000 mg | ORAL_TABLET | Freq: Three times a day (TID) | ORAL | Status: DC | PRN
  Administered 2016-09-09: 10 mg via ORAL
  Filled 2016-09-09: qty 1

## 2016-09-09 MED ORDER — ATORVASTATIN CALCIUM 20 MG PO TABS: 20.0000 mg | ORAL_TABLET | Freq: Every day | ORAL | 0 refills | Status: DC

## 2016-09-09 NOTE — Progress Notes (Signed)
Patient's IV removed.  Site WNL.  AVS reviewed with patient.  Verbalized understanding of discharge instructions, physician follow-up, medications.  Stroke education booklet given to patient. MATCH voucher given to patient at discharge.  Patient transported by NT via w/c to main entrance for discharge.  Patient stable at time of discharge.

## 2016-09-09 NOTE — Evaluation (Signed)
Occupational Therapy Evaluation Patient Details Name: Alice [name] MRN: [id number] DOB: 1965/02/20 Today's Date: 09/09/2016    History of Present Illness Alice [name] is a 51 y.o. female with medical history significant of possible recent CVA; HTN; HLD: COPD; cocaine abuse; tobacco dependence; and anemia presenting with TIA/CVA symptoms.  Patient was recently admitted from 8/24-26 with stroke-like symptoms in the setting of cocaine abuse; she was given tPA with resolution of her symptoms and then had a subsequent negative MRI.  She got up to go to the bathroom this morning and was ataxic.  She also noticed slurring of speech.  No dysphagia. R leg is numb from mid-calf to toes. Symptoms have been present since she got up this AM. She was completely normal last night when she went to bed.  She reports having had 3 strokes, most recently last week having been discharged [facility] on Sunday.  She was treated with tPA and 15 minutes later symptoms resolved.       Clinical Impression   Pt received semi-reclined in bed, agreeable to OT evaluation. Pt demonstrates BUE strength, sensation, and coordination WFL. Pt performing ADL tasks independently, slight ataxia initially during functional mobility however pt's gait improves with sustained movement. Pt is at baseline with ADL completion, educated pt on sitting for tasks if feeling unsteady in standing. Pt continues to have slurred speech compared to baseline. No further OT services required at this time.     Follow Up Recommendations  No OT follow up    Equipment Recommendations  None recommended by OT       Precautions / Restrictions Precautions Precautions: Fall Restrictions Weight Bearing Restrictions: No      Mobility Bed Mobility Overal bed mobility: Modified Independent                Transfers Overall transfer level: Modified independent Equipment used: None                      ADL either performed or assessed  with clinical judgement   ADL Overall ADL's : Modified independent                                       General ADL Comments: Performing LB dressing, functional mobility, bed mobility, navigating room and hallway with no difficulty     Vision Baseline Vision/History: Wears glasses Wears Glasses: Reading only Patient Visual Report: No change from baseline Vision Assessment?: Yes Eye Alignment: Within Functional Limits Ocular Range of Motion: Within Functional Limits Alignment/Gaze Preference: Within Defined Limits Tracking/Visual Pursuits: Able to track stimulus in all quads without difficulty Convergence: Within functional limits Visual Fields: No apparent deficits Depth Perception:  [facility](WFL)            Pertinent Vitals/Pain Pain Assessment: No/denies pain     Hand Dominance Right   Extremity/Trunk Assessment Upper Extremity Assessment Upper Extremity Assessment: Overall WFL for tasks assessed (RUE weakness at baseline)   Lower Extremity Assessment Lower Extremity Assessment: Defer to PT evaluation   Cervical / Trunk Assessment Cervical / Trunk Assessment: Normal   Communication Communication Communication: No difficulties   Cognition Arousal/Alertness: Awake/alert Behavior During Therapy: WFL for tasks assessed/performed Overall Cognitive Status: Within Functional Limits for tasks assessed  Home Living Family/patient expects to be discharged to:: Private residence Living Arrangements: Spouse/significant other Available Help at Discharge: Family;Available 24 hours/day;Friend(s) Type of Home: Mobile home Home Access: Stairs to enter Entrance Stairs-Number of Steps: 4 Entrance Stairs-Rails: Right;Left;Can reach both Home Layout: One level     Bathroom Shower/Tub: Producer, television/film/video: Handicapped height     Home Equipment: Cane - single point;Toilet riser       Lives With: Significant other    Prior Functioning/Environment Level of Independence: Independent        Comments: PTA RUE deficits from previous stroke        OT Problem List: Impaired balance (sitting and/or standing);Decreased safety awareness       AM-PAC PT "6 Clicks" Daily Activity     Outcome Measure Help from another person eating meals?: None Help from another person taking care of personal grooming?: None Help from another person toileting, which includes using toliet, bedpan, or urinal?: None Help from another person bathing (including washing, rinsing, drying)?: None Help from another person to put on and taking off regular upper body clothing?: None Help from another person to put on and taking off regular lower body clothing?: None 6 Click Score: 24   End of Session Equipment Utilized During Treatment: Gait belt  Activity Tolerance: Patient tolerated treatment well Patient left: in bed;with call bell/phone within reach;with family/visitor present  OT Visit Diagnosis: Ataxia, unspecified (R27.0)                Time: 1610-9604 OT Time Calculation (min): 22 min Charges:  OT General Charges $OT Visit: 1 Visit OT Evaluation $OT Eval Low Complexity: 1 Low    Ezra Sites, OTR/L  [phone] 09/09/2016, 9:00 AM

## 2016-09-09 NOTE — Progress Notes (Signed)
PROGRESS NOTE  Alice [name] UJW:[id number] DOB: May 01, 1965 DOA: 09/08/2016 PCP: Patient, No Pcp Per  Brief Narrative: 51 year old woman PMH including essential hypertension, hyperlipidemia, CVA, recent neurologic event, history of cocaine abuse presented with ataxia, right leg weakness and slurred speech. Found to have dysarthria, wide-based shuffling gait. Mild left facial droop noted. Admitted for ataxia and dysarthria.  Assessment/Plan Ataxia, dysarthria. Similar episode within the last week, at that time treated with TPA. Previous workup included unremarkable CT angiogram of the head and neck, MRI of the brain, MRA of the head. Echocardiogram was deferred to the outpatient setting. Patient admitted to use of cocaine 8/28. -CT, MRI brain negative again. Neurology recommended an MRI of the cervical spine to assess her spinal myelopathy. Neurology consult requested. -Occupational therapy recommends no follow-up. Patient felt to be at baseline. Physical therapy recommended CIR. Unfortunately, patient not a candidate for this secondary to insurance per case management. -Neurology impression, differential diagnosis includes reversible cerebrovascular syndrome secondary to cocaine, complicated migraine headaches, complex partial seizures and psychogenic disorders. No evidence of myelopathy. Recommendations were for EEG, orthostatics, further laboratory studies.  Cocaine abuse, recently incarcerated. -Seen by social work. The patient has a ready arranged inpatient substance abuse treatment next month. She also has an active patient with [facility].  COPD -Stable.   Essential hypertension -Stable.   DVT prophylaxis: enoxaparin Code Status: full Family Communication: none Disposition Plan: home    Brendia Sacks, MD  [facility] Direct contact: [phone] --Via amion app OR  --[email or web address]; password [email or web address]  7PM-7AM contact night coverage as above 09/09/2016, 3:09 PM  LOS: 0 days     Consultants: Neurology  Procedures:    Antimicrobials:    Interval history/Subjective: Feels about the same. Speech remains slurred. Feels numbness in the left side of her face. Able to walk but her gait does not feel normal. Reports she had similar symptoms the first time. Admits to using cocaine recently.  Objective: Vitals: Afebrile, 98.4, 16, 87, 113/76   Exam:     Constitutional: Appears calm, comfortable.  Psychiatric. Grossly normal mood and affect.  Neurologic. Slight left-sided facial weakness noted. Reports decreased sensation on the left side of the face. Cranial nerves otherwise appear intact. No upper extremity dysdiadochokinesis. No pronator drift.  Musculoskeletal. Grossly normal tone and strength all constitutional malaise. Strength appears to be symmetric all extremities.  Eyes. Pupils, irises, lids appear normal. Extraocular movements intact.  Cardiovascular. Regular rate and rhythm. No murmur, rub or gallop. Telemetry sinus rhythm.  Respiratory. Clear to auscultation bilaterally. No wheezes, rales or rhonchi. Normal respiratory effort.  Abdomen. Soft, nontender, nondistended.   I have personally reviewed the following:   Labs:  Complete metabolic panel unremarkable, troponin negative, LDL last admission was 58, hemoglobin A1c 5.4  CBC unremarkable   TSH within normal limits   Urine drug screen positive for cocaine  Imaging studies: CT head and MRI brain no acute abnormalities  Medical tests: EKG sinus rhythm  Test discussed with performing physician:    Decision to obtain old records:    Review and summation of old records:  Hospitalized 8/24 for sudden onset slurred speech, left-sided weakness. She was given TPA and transferred to [facility]. Per discharge summary, discharge diagnosis strokelike episode treated with TPA, no significant deficits. Discharged on aspirin, atorvastatin. CT angiogram head and neck without acute findings.  MRI brain no acute stroke. MRA head no definite flow reducing stenosis or saccular aneurysm. Mild lower left facial asymmetry was noted. Mild diminished fine finger movements  on the left are noted.  Scheduled Meds: .  stroke: mapping our early stages of recovery book   Does not apply Once  . amitriptyline  25 mg Oral QHS  . aspirin  325 mg Oral Daily  . atorvastatin  20 mg Oral Daily  . DULoxetine  60 mg Oral Daily  . enoxaparin (LOVENOX) injection  40 mg Subcutaneous Q24H  . fluPHENAZine  1 mg Oral Daily  . nicotine  21 mg Transdermal Daily  . tiotropium  18 mcg Inhalation Daily   Continuous Infusions: . sodium chloride 50 mL/hr at 09/09/16 1452    Principal Problem:   Ataxia Active Problems:   Benign essential HTN   Drug abuse   COPD (chronic obstructive pulmonary disease) (HCC)   Dysarthria   LOS: 0 days
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
## 2016-09-09 NOTE — Evaluation (Signed)
Physical Therapy Evaluation Patient Details Name: Alice [name] MRN: [id number] DOB: 09-26-1965 Today's Date: 09/09/2016   History of Present Illness  51 y.o. female with possible recent CVA; HTN; HLD: COPD; cocaine abuse; tobacco dependence; and anemia presenting with TIA/CVA symptoms.  Admitted from 8/24-26 with stroke-like symptoms in the setting of cocaine abuse; she was given tPA with resolution of her symptoms and then had a subsequent negative MRI. Now has numbness RLE, ataxia, slurred speech.  R leg is numb from mid-calf to toes. She reports having had 3 strokes, most recently last week having been discharged from [facility] on Sunday.  She was treated with tPA and 15 minutes later symptoms resolved.    Clinical Impression  Pt is up to walk with assistance and noted her difficulty with turns and transitions especially.  Had no appropriately sized walker for visit and used IV pole, but then located one at end of session.  Will focus on strength and balance initially and will need HEP as she may not qualify for follow up therapy.    Follow Up Recommendations CIR    Equipment Recommendations  Rolling walker with 5" wheels    Recommendations for Other Services       Precautions / Restrictions Precautions Precautions: Fall (slow to respond to loss of balance) Precaution Comments: poor protective extension Restrictions Weight Bearing Restrictions: No Other Position/Activity Restrictions: L side tests weaker, R side numbness LE      Mobility  Bed Mobility Overal bed mobility: Modified Independent (extra time and use of bedrail)                Transfers Overall transfer level: Needs assistance Equipment used: 1 person hand held assist (IV pole to control LOB upon initial standing) Transfers: Sit to/from Stand Sit to Stand: Min assist;From elevated surface         General transfer comment: requires verbal cuing to place hands and to transition to IV pole for balance  control  Ambulation/Gait Ambulation/Gait assistance: Min assist (verbal and tactile cues) Ambulation Distance (Feet): 75 Feet Assistive device: 1 person hand held assist (IV pole) Gait Pattern/deviations: Decreased weight shift to left;Decreased stride length;Step-to pattern;Wide base of support;Trunk flexed;Shuffle Gait velocity: reduced Gait velocity interpretation: Below normal speed for age/gender General Gait Details: shuffled wide base gait and LOB laterally  Stairs            Wheelchair Mobility    Modified Rankin (Stroke Patients Only) Modified Rankin (Stroke Patients Only) Pre-Morbid Rankin Score: Slight disability Modified Rankin: Moderately severe disability     Balance Overall balance assessment: Needs assistance Sitting-balance support: Feet supported Sitting balance-Leahy Scale: Fair     Standing balance support: Bilateral upper extremity supported;During functional activity Standing balance-Leahy Scale: Poor                               Pertinent Vitals/Pain Pain Assessment: No/denies pain    Home Living Family/patient expects to be discharged to:: Private residence Living Arrangements: Spouse/significant other Available Help at Discharge: Family;Available 24 hours/day;Friend(s) Type of Home: Mobile home Home Access: Stairs to enter Entrance Stairs-Rails: Right;Left;Can reach both Entrance Stairs-Number of Steps: 4 Home Layout: One level Home Equipment: Cane - single point;Toilet riser      Prior Function Level of Independence: Independent         Comments: old R side changes from stroke     Hand Dominance  Extremity/Trunk Assessment   Upper Extremity Assessment Upper Extremity Assessment: Generalized weakness    Lower Extremity Assessment Lower Extremity Assessment: Generalized weakness    Cervical / Trunk Assessment Cervical / Trunk Assessment: Normal  Communication   Communication: Expressive  difficulties (slurred speech)  Cognition Arousal/Alertness: Awake/alert Behavior During Therapy: Flat affect Overall Cognitive Status: Within Functional Limits for tasks assessed                                        General Comments      Exercises     Assessment/Plan    PT Assessment Patient needs continued PT services  PT Problem List Decreased strength;Decreased range of motion;Decreased activity tolerance;Decreased balance;Decreased mobility;Decreased coordination;Decreased safety awareness;Cardiopulmonary status limiting activity       PT Treatment Interventions Gait training;Stair training;Functional mobility training;DME instruction;Therapeutic activities;Therapeutic exercise;Balance training;Neuromuscular re-education;Patient/family education    PT Goals (Current goals can be found in the Care Plan section)  Acute Rehab PT Goals Patient Stated Goal: to get home and get stronger PT Goal Formulation: With patient Time For Goal Achievement: 09/23/16 Potential to Achieve Goals: Good    Frequency Min 5X/week   Barriers to discharge Inaccessible home environment stairs to enter house    Co-evaluation               AM-PAC PT "6 Clicks" Daily Activity  Outcome Measure Difficulty turning over in bed (including adjusting bedclothes, sheets and blankets)?: A Little Difficulty moving from lying on back to sitting on the side of the bed? : A Little Difficulty sitting down on and standing up from a chair with arms (e.g., wheelchair, bedside commode, etc,.)?: Unable Help needed moving to and from a bed to chair (including a wheelchair)?: A Little Help needed walking in hospital room?: A Little Help needed climbing 3-5 steps with a railing? : A Lot 6 Click Score: 15    End of Session Equipment Utilized During Treatment: Gait belt Activity Tolerance: Patient limited by fatigue Patient left: in bed;with call bell/phone within reach;with bed alarm  set Nurse Communication: Mobility status PT Visit Diagnosis: Unsteadiness on feet (R26.81);Other abnormalities of gait and mobility (R26.89);Muscle weakness (generalized) (M62.81);Ataxic gait (R26.0);Hemiplegia and hemiparesis Hemiplegia - Right/Left: Left Hemiplegia - dominant/non-dominant: Non-dominant Hemiplegia - caused by: Cerebral infarction    Time: 1040-1107 PT Time Calculation (min) (ACUTE ONLY): 27 min   Charges:   PT Evaluation $PT Eval Moderate Complexity: 1 Mod PT Treatments $Gait Training: 8-22 mins   PT G Codes:   PT G-Codes **NOT FOR INPATIENT CLASS** Functional Assessment Tool Used: AM-PAC 6 Clicks Basic Mobility;Clinical judgement Functional Limitation: Mobility: Walking and moving around Mobility: Walking and Moving Around Current Status (W0981(G8978): At least 40 percent but less than 60 percent impaired, limited or restricted Mobility: Walking and Moving Around Goal Status [phone]([id number]): At least 1 percent but less than 20 percent impaired, limited or restricted    Ivar DrapeRuth E Ameka Krigbaum 09/09/2016, 12:10 PM   12:11 PM, 09/09/16 Samul Dadauth Cristine Daw, PT, MS Physical Therapist - [place] [phone](ASCOM)  [phone] (Office)

## 2016-09-09 NOTE — Progress Notes (Signed)
EEG Completed; Results Pending  

## 2016-09-09 NOTE — Progress Notes (Signed)
*  PRELIMINARY RESULTS* Echocardiogram 2D Echocardiogram has been performed.  Jeryl Columbialliott, Zada Haser 09/09/2016, 3:15 PM

## 2016-09-09 NOTE — Progress Notes (Signed)
CODE STROKE FOR 09/03/2016 1017 CALL 1018 BEEPER 1021 EXAM STARTED 1024 EXAM FINISHED 1025 EXAM COMPLETE IN EPIC 1025 [place] [facility] CALLED
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
## 2016-09-09 NOTE — Care Management Note (Signed)
Case Management Note  Patient Details  Name: Alice [name] MRN: [id number] Date of Birth: Apr 07, 1965  Subjective/Objective:               Admitted with ? CVA. Pt is from home, lives with bf. She was released from jail Friday, [facility]. Pt drug user. She plans to enter into rehab in mid sept. [facility], she reports last seeing them in January. She lost medicaid when entering jail in Feb. Now needs to get medicaid re-started. Pt encouraged to start that process tomorrow if DC'd. Pt will need MATCH voucher and Rx for all needed medications. Pt encouraged to utilize the [facility] if she is unable to get medication re-started before needing re-fills. PT has recommended CIR. CIR and SNF not option as pt is non-insured. Pt [facility] because she is not homebound and has no current PCP, also she plans to go to inpt rehab soon after DC. CSW seeing pt for substance abuse and social resources.     Action/Plan: Bonita QuinLinda, [facility] rep, aware of dme referral and will obtain pt info from chart and deliver DME to pt room prior to DC.   Expected Discharge Date:     09/09/2016          Expected Discharge Plan:  Home/Self Care  In-House Referral:  Clinical Social Work  Discharge planning Services  CM Consult, [facility]  Post Acute Care Choice:  Durable Medical Equipment Choice offered to:  Patient  DME Arranged:  Walker rolling DME Agency:  [facility]  Status of Service:  Completed, signed off  Alice [name], Alice Lysaght Demske, RN 09/09/2016, 2:02 PM

## 2016-09-10 LAB — RPR: RPR: NONREACTIVE

## 2016-09-10 LAB — HIV ANTIBODY (ROUTINE TESTING W REFLEX): HIV Screen 4th Generation wRfx: NONREACTIVE

## 2016-09-10 LAB — HOMOCYSTEINE: HOMOCYSTEINE-NORM: 10.8 umol/L (ref 0.0–15.0)

## 2016-09-10 NOTE — Discharge Summary (Addendum)
Physician Discharge Summary  Rosaria FerriesDoris Mckinley ZOX:[id number] DOB: 28-May-1965 DOA: 09/08/2016  PCP: Elenora GammaBradshaw, Samuel L, MD  Admit date: 09/08/2016 Discharge date: 09/09/2016  Recommendations for Outpatient Follow-up:  1. Follow-up for recurrent neurologic symptoms of unclear etiology. 2. Continue to encourage abstinence from cocaine use.   Follow-up Information    Micki RileySethi, Pramod S, MD. Schedule an appointment as soon as possible for a visit in 1 month(s).   Specialties:  Neurology, Radiology Why:  Call office Friday, September 10, 2016, to schedule hospital follow-up appointment within one month. Contact information: [address] [place] [address] [phone]        Services, [facility] Follow up.   Why:  Follow-up as scheduled Contact information: [address] [place] [address] [phone]            Discharge Diagnoses:  1. Ataxia, dysarthria 2. Cocaine abuse 3. Essential hypertension  Discharge Condition: Improved Disposition: Home  Diet recommendation: Heart healthy  Filed Weights   09/08/16 1004 09/08/16 1845  Weight: 68.5 kg (151 lb) 68.5 kg (151 lb)    History of present illness:  51 year old woman PMH including essential hypertension, hyperlipidemia, CVA, recent neurologic event, history of cocaine abuse presented with ataxia, right leg weakness and slurred speech.  Hospital Course:  Patient was observed overnight. MRI brain was negative. Extensive stroke investigation previously performed within the last week was not duplicated. Seen by neurology, impression was focal neurologic symptoms of unclear etiology, differential including cerebrovascular spasm secondary to cocaine, complicated migraine headaches, complex partial seizures, psychogenic disorders. EEG and lab work was recommended. Discussed with neurology, recommended no further inpatient evaluation, from neurology perspective okay for discharge 8/30. She should follow-up with  neurology as previously planned.  Continued to have mild dysarthria which improved with slowed rate of speaking. Patient had difficulty with turns and transitions and required some assistance to walk. She will have good support at home. Seen by speech pathology who recommended no follow-up. Seen by physical therapy, CIR was recommended however the patient was not a candidate secondary to lack of insurance unfortunately, nor for SNF per CSW. Per case management not a candidate for home health has patient is not homebound and has not seen her PCP in some time. The patient previously went to [facility]. She plans on following up with physician of her choice.  In regard to cocaine abuse. The patient has already arranged for inpatient substance abuse treatment will begin in approximately the next 2 weeks.  Consultants:  Neurology   Today's assessment: See progress note same day.  Discharge Instructions  Discharge Instructions    Diet - low sodium heart healthy    Complete by:  As directed    Discharge instructions    Complete by:  As directed    Call your physician or seek immediate medical attention for weakness, numbness, speech difficulties, or worsening of condition.   Increase activity slowly    Complete by:  As directed      Allergies as of 09/09/2016      Reactions   Benadryl [diphenhydramine Hcl] Hives   Codeine Rash   Tetracycline Itching, Rash      Medication List    STOP taking these medications   fluPHENAZine 1 MG tablet Commonly known as:  PROLIXIN     TAKE these medications   albuterol 108 (90 Base) MCG/ACT inhaler Commonly known as:  PROVENTIL HFA;VENTOLIN HFA Inhale 2 puffs into the lungs every 6 (six) hours as needed for wheezing  or shortness of breath.   amitriptyline 25 MG tablet Commonly known as:  ELAVIL Take 1 tablet (25 mg total) by mouth at bedtime.   aspirin 325 MG EC tablet Take 1 tablet (325 mg total) by mouth daily.   atorvastatin 20 MG  tablet Commonly known as:  LIPITOR Take 1 tablet (20 mg total) by mouth daily.   DULoxetine 60 MG capsule Commonly known as:  CYMBALTA Take 1 capsule (60 mg total) by mouth daily.   lisinopril 5 MG tablet Commonly known as:  PRINIVIL,ZESTRIL Take 2 tablets (10 mg total) by mouth daily.   tiotropium 18 MCG inhalation capsule Commonly known as:  SPIRIVA HANDIHALER Place 1 capsule (18 mcg total) into inhaler and inhale daily.            Discharge Care Instructions        Start     Ordered   09/09/16 0000  amitriptyline (ELAVIL) 25 MG tablet  Daily at bedtime     09/09/16 1736   09/09/16 0000  atorvastatin (LIPITOR) 20 MG tablet  Daily     09/09/16 1736   09/09/16 0000  DULoxetine (CYMBALTA) 60 MG capsule  Daily     09/09/16 1736   09/09/16 0000  lisinopril (PRINIVIL,ZESTRIL) 5 MG tablet  Daily     09/09/16 1736   09/09/16 0000  tiotropium (SPIRIVA HANDIHALER) 18 MCG inhalation capsule  Daily     09/09/16 1736   09/09/16 0000  Increase activity slowly     09/09/16 1736   09/09/16 0000  Diet - low sodium heart healthy     09/09/16 1736   09/09/16 0000  Discharge instructions    Comments:  Call your physician or seek immediate medical attention for weakness, numbness, speech difficulties, or worsening of condition.   09/09/16 1736     Allergies  Allergen Reactions  . Benadryl [Diphenhydramine Hcl] Hives  . Codeine Rash  . Tetracycline Itching and Rash    The results of significant diagnostics from this hospitalization (including imaging, microbiology, ancillary and laboratory) are listed below for reference.    Significant Diagnostic Studies: Ct Head Wo Contrast  Result Date: 09/08/2016 CLINICAL DATA:  Slurred speech.  Swaying from side side. EXAM: CT HEAD WITHOUT CONTRAST TECHNIQUE: Contiguous axial images were obtained from the base of the skull through the vertex without intravenous contrast. COMPARISON:  None. FINDINGS: Brain: No evidence of acute infarction,  hemorrhage, hydrocephalus, extra-axial collection or mass lesion/mass effect. Vascular: No hyperdense vessel or unexpected calcification. Skull: No osseous abnormality. Sinuses/Orbits: Visualized paranasal sinuses are clear. Visualized mastoid sinuses are clear. Visualized orbits demonstrate no focal abnormality. Other: None IMPRESSION: No acute intracranial pathology. Electronically Signed   By: Elige Ko   On: 09/08/2016 10:44   Mr Brain Wo Contrast  Result Date: 09/08/2016 CLINICAL DATA:  Slurred speech.  Gait instability. EXAM: MRI HEAD WITHOUT CONTRAST TECHNIQUE: Multiplanar, multiecho pulse sequences of the brain and surrounding structures were obtained without intravenous contrast. COMPARISON:  Head CT 09/08/2016 and MRI 09/04/2016 FINDINGS: The examination had to be discontinued prior to completion due to claustrophobia. Axial and coronal diffusion, sagittal T1, axial FLAIR, and axial T2 sequences were obtained. There is mild motion artifact. Brain: There is no evidence of acute infarct, intracranial hemorrhage, mass, midline shift, or extra-axial fluid collection. The ventricles and sulci are normal. A few punctate foci of T2 hyperintensity in the cerebral white matter bilaterally are unchanged from the recent MRI, nonspecific, and not greater than expected for patient's  age. Vascular: Major intracranial vascular flow voids are preserved. Skull and upper cervical spine: No suspicious osseous lesion. Sinuses/Orbits: Unremarkable orbits. Paranasal sinuses and mastoid air cells are clear. Other: None. IMPRESSION: No acute infarct or significant intracranial abnormality evident on this incomplete examination. Electronically Signed   By: Sebastian Ache M.D.   On: 09/08/2016 14:34   Microbiology: Recent Results (from the past 240 hour(s))  MRSA PCR Screening     Status: None   Collection Time: 09/03/16  5:43 PM  Result Value Ref Range Status   MRSA by PCR NEGATIVE NEGATIVE Final    Comment:          The GeneXpert MRSA Assay (FDA approved for NASAL specimens only), is one component of a comprehensive MRSA colonization surveillance program. It is not intended to diagnose MRSA infection nor to guide or monitor treatment for MRSA infections.      Labs: Basic Metabolic Panel:  Recent Labs Lab 09/03/16 1025 09/03/16 1037 09/08/16 1019 09/08/16 1039  NA 140 143 138 141  K 3.9 4.0 4.3 4.3  CL 104 102 101 102  CO2 30  --  30  --   GLUCOSE 90 87 107* 106*  BUN 15 16 14 14   CREATININE 0.65 0.70 0.70 0.60  CALCIUM 8.9  --  9.3  --    Liver Function Tests:  Recent Labs Lab 09/03/16 1025 09/08/16 1019  AST 17 21  ALT 11* 13*  ALKPHOS 86 75  BILITOT 0.4 0.4  PROT 6.9 7.9  ALBUMIN 3.9 4.1   CBC:  Recent Labs Lab 09/03/16 1025 09/03/16 1037 09/08/16 1019 09/08/16 1039  WBC 6.8  --  6.2  --   NEUTROABS 3.9  --  3.6  --   HGB 12.1 11.9* 12.9 12.9  HCT 36.0 35.0* 38.2 38.0  MCV 92.3  --  93.2  --   PLT 174  --  193  --     CBG:  Recent Labs Lab 09/03/16 1033  GLUCAP 80    Principal Problem:   Ataxia Active Problems:   Benign essential HTN   Drug abuse   COPD (chronic obstructive pulmonary disease) (HCC)   Dysarthria   Time coordinating discharge: 40 minutes  Signed:  Brendia Sacks, MD [facility] 09/10/2016, 8:04 AM

## 2016-09-10 NOTE — Procedures (Signed)
  [facility] Laurissa Cowper A. Gerilyn Pilgrimoonquah, MD     [email or web address]           HISTORY: This is a 51 year old female who presents with recurrent focal neurological symptoms. The study is being done to evaluate for partial seizures as the etiology.   MEDICATIONS: Scheduled Meds: Continuous Infusions: PRN Meds:.  Prior to Admission medications   Medication Sig Start Date End Date Taking? Authorizing Provider  albuterol (PROVENTIL HFA;VENTOLIN HFA) 108 (90 Base) MCG/ACT inhaler Inhale 2 puffs into the lungs every 6 (six) hours as needed for wheezing or shortness of breath. 10/31/15   Elenora GammaBradshaw, Samuel L, MD  amitriptyline (ELAVIL) 25 MG tablet Take 1 tablet (25 mg total) by mouth at bedtime. 09/09/16   Standley BrookingGoodrich, Daniel P, MD  aspirin EC 325 MG EC tablet Take 1 tablet (325 mg total) by mouth daily. 09/06/16   Rinehuls, Kinnie Scalesavid L, PA-C  atorvastatin (LIPITOR) 20 MG tablet Take 1 tablet (20 mg total) by mouth daily. 09/09/16   Standley BrookingGoodrich, Daniel P, MD  DULoxetine (CYMBALTA) 60 MG capsule Take 1 capsule (60 mg total) by mouth daily. 09/09/16   Standley BrookingGoodrich, Daniel P, MD  lisinopril (PRINIVIL,ZESTRIL) 5 MG tablet Take 2 tablets (10 mg total) by mouth daily. 09/09/16   Standley BrookingGoodrich, Daniel P, MD  tiotropium (SPIRIVA HANDIHALER) 18 MCG inhalation capsule Place 1 capsule (18 mcg total) into inhaler and inhale daily. 09/09/16   Standley BrookingGoodrich, Daniel P, MD      ANALYSIS: A 16 channel recording using standard 10 20 measurements is conducted for 24 minutes. There is a low voltage high frequency background activity of about 14 Hz bilaterally. There is also beta activity observed in the frontal areas. Awake and sleep activities are documented with K complexes and sleep spindles. There is episodic temporal sharp wave activity that occurs transiently during drowsiness much more prominent on the left side. 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 There is a clear single sharp wave activity that phase reverses at T3. Photic  stimulation and hyperventilation are not conducted. There is no focal or lateral slowing.   IMPRESSION: This recording is mildly abnormal showing a single sharp wave epileptiform activities involving the left temporal region.      Shalona Harbour A. Gerilyn Pilgrimoonquah, M.D.  Diplomate, Biomedical engineerAmerican Board of Psychiatry and Neurology ( Neurology).

## 2016-09-14 NOTE — Progress Notes (Signed)
EEG noted.  Discussed with Dr. Gerilyn Pilgrimoonquah. Abnormal findings do not correlate with patient's symptoms. He recommends against any treatment at this point. Follow-up as an outpatient.  Brendia Sacksaniel Kelise Kuch, MD

## 2016-09-29 ENCOUNTER — Emergency Department (HOSPITAL_COMMUNITY)

## 2016-09-29 ENCOUNTER — Encounter (HOSPITAL_COMMUNITY): Payer: Self-pay

## 2016-09-29 ENCOUNTER — Emergency Department (HOSPITAL_COMMUNITY)
Admission: EM | Admit: 2016-09-29 | Discharge: 2016-09-29 | Disposition: A | Discharge status: Home / Self Care | Attending: Emergency Medicine | Admitting: Emergency Medicine

## 2016-09-29 DIAGNOSIS — R202 Paresthesia of skin: Secondary | ICD-10-CM | POA: Diagnosis present

## 2016-09-29 DIAGNOSIS — J449 Chronic obstructive pulmonary disease, unspecified: Secondary | ICD-10-CM | POA: Insufficient documentation

## 2016-09-29 DIAGNOSIS — Z8673 Personal history of transient ischemic attack (TIA), and cerebral infarction without residual deficits: Secondary | ICD-10-CM | POA: Diagnosis not present

## 2016-09-29 DIAGNOSIS — Z7982 Long term (current) use of aspirin: Secondary | ICD-10-CM | POA: Insufficient documentation

## 2016-09-29 DIAGNOSIS — I10 Essential (primary) hypertension: Secondary | ICD-10-CM | POA: Insufficient documentation

## 2016-09-29 DIAGNOSIS — Z79899 Other long term (current) drug therapy: Secondary | ICD-10-CM | POA: Diagnosis not present

## 2016-09-29 DIAGNOSIS — F17200 Nicotine dependence, unspecified, uncomplicated: Secondary | ICD-10-CM | POA: Diagnosis not present

## 2016-09-29 LAB — URINALYSIS, ROUTINE W REFLEX MICROSCOPIC
BILIRUBIN URINE: NEGATIVE
Glucose, UA: NEGATIVE mg/dL
Hgb urine dipstick: NEGATIVE
KETONES UR: NEGATIVE mg/dL
LEUKOCYTES UA: NEGATIVE
NITRITE: NEGATIVE
PH: 7 (ref 5.0–8.0)
PROTEIN: NEGATIVE mg/dL
Specific Gravity, Urine: 1.011 (ref 1.005–1.030)

## 2016-09-29 LAB — COMPREHENSIVE METABOLIC PANEL
ALBUMIN: 4 g/dL (ref 3.5–5.0)
ALK PHOS: 62 U/L (ref 38–126)
ALT: 9 U/L — ABNORMAL LOW (ref 14–54)
ANION GAP: 7 (ref 5–15)
AST: 16 U/L (ref 15–41)
BILIRUBIN TOTAL: 0.5 mg/dL (ref 0.3–1.2)
BUN: 13 mg/dL (ref 6–20)
CO2: 27 mmol/L (ref 22–32)
Calcium: 9.3 mg/dL (ref 8.9–10.3)
Chloride: 102 mmol/L (ref 101–111)
Creatinine, Ser: 0.67 mg/dL (ref 0.44–1.00)
GFR calc Af Amer: >60 mL/min (ref >60)
GFR calc non Af Amer: >60 mL/min (ref >60)
GLUCOSE: 97 mg/dL (ref 65–99)
POTASSIUM: 3.9 mmol/L (ref 3.5–5.1)
SODIUM: 136 mmol/L (ref 135–145)
Total Protein: 7.5 g/dL (ref 6.5–8.1)

## 2016-09-29 LAB — CBC WITH DIFFERENTIAL/PLATELET
Basophils Absolute: 0 10*3/uL (ref 0.0–0.1)
Basophils Relative: 0 %
Eosinophils Absolute: 0.1 10*3/uL (ref 0.0–0.7)
Eosinophils Relative: 1 %
HEMATOCRIT: 36 % (ref 36.0–46.0)
Hemoglobin: 12.2 g/dL (ref 12.0–15.0)
LYMPHS PCT: 25 %
Lymphs Abs: 2 10*3/uL (ref 0.7–4.0)
MCH: 30.4 pg (ref 26.0–34.0)
MCHC: 33.9 g/dL (ref 30.0–36.0)
MCV: 89.8 fL (ref 78.0–100.0)
MONO ABS: 0.3 10*3/uL (ref 0.1–1.0)
MONOS PCT: 4 %
NEUTROS ABS: 5.6 10*3/uL (ref 1.7–7.7)
Neutrophils Relative %: 70 %
PLATELETS: 252 10*3/uL (ref 150–400)
RBC: 4.01 MIL/uL (ref 3.87–5.11)
RDW: 13 % (ref 11.5–15.5)
WBC: 8 10*3/uL (ref 4.0–10.5)

## 2016-09-29 LAB — RAPID URINE DRUG SCREEN, HOSP PERFORMED
Amphetamines: NOT DETECTED
BARBITURATES: NOT DETECTED
Benzodiazepines: NOT DETECTED
COCAINE: NOT DETECTED
Opiates: NOT DETECTED
TETRAHYDROCANNABINOL: NOT DETECTED

## 2016-09-29 LAB — ETHANOL

## 2016-09-29 LAB — CBG MONITORING, ED: Glucose-Capillary: 95 mg/dL (ref 65–99)

## 2016-09-29 LAB — AMMONIA: Ammonia: 10 umol/L (ref 9–35)

## 2016-09-29 MED ORDER — SODIUM CHLORIDE 0.9 % IV SOLN
INTRAVENOUS | Status: DC
  Administered 2016-09-29: 12:00:00 via INTRAVENOUS

## 2016-09-29 MED ORDER — SODIUM CHLORIDE 0.9 % IV BOLUS (SEPSIS)
1000.0000 mL | Freq: Once | INTRAVENOUS | Status: AC
  Administered 2016-09-29: 1000 mL via INTRAVENOUS
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
## 2016-09-29 NOTE — ED Notes (Signed)
Spoke with Sheriff[employer] Department and Lt. Crowder: States pt pulls these events when she gets tired of staying in jail. States she was out of police's custody when pt was at [facility] and after following up with her probation officer she subsequently failed a drug screen. States she does not believe patient is having true symptoms

## 2016-09-29 NOTE — ED Triage Notes (Signed)
Pt incarcerated.  At 1030 this morning started having numbness in left side of face, facial drooping, slurred speech, and numbness in left arm and hand.  Also reports r foot numbness.  Pt had stroke symptoms recently and was treated with tpa.

## 2016-10-19 ENCOUNTER — Other Ambulatory Visit (HOSPITAL_COMMUNITY): Payer: Self-pay | Admitting: Student in an Organized Health Care Education/Training Program

## 2016-10-19 DIAGNOSIS — I63529 Cerebral infarction due to unspecified occlusion or stenosis of unspecified anterior cerebral artery: Secondary | ICD-10-CM

## 2016-12-15 ENCOUNTER — Other Ambulatory Visit: Payer: Self-pay

## 2017-05-23 DIAGNOSIS — I639 Cerebral infarction, unspecified: Secondary | ICD-10-CM | POA: Diagnosis not present

## 2017-06-19 DIAGNOSIS — R918 Other nonspecific abnormal finding of lung field: Secondary | ICD-10-CM | POA: Diagnosis not present

## 2017-07-11 ENCOUNTER — Ambulatory Visit: Payer: Self-pay | Admitting: Family Medicine

## 2017-07-19 ENCOUNTER — Encounter: Payer: Self-pay | Admitting: Family Medicine

## 2018-06-16 ENCOUNTER — Encounter (INDEPENDENT_AMBULATORY_CARE_PROVIDER_SITE_OTHER): Payer: Self-pay

## 2018-07-27 DIAGNOSIS — I330 Acute and subacute infective endocarditis: Secondary | ICD-10-CM | POA: Insufficient documentation

## 2018-10-02 DIAGNOSIS — I10 Essential (primary) hypertension: Secondary | ICD-10-CM | POA: Diagnosis not present

## 2018-10-02 DIAGNOSIS — M25461 Effusion, right knee: Secondary | ICD-10-CM | POA: Diagnosis not present

## 2018-10-02 DIAGNOSIS — Z8673 Personal history of transient ischemic attack (TIA), and cerebral infarction without residual deficits: Secondary | ICD-10-CM | POA: Diagnosis not present

## 2018-10-02 DIAGNOSIS — M25561 Pain in right knee: Secondary | ICD-10-CM | POA: Diagnosis not present

## 2018-10-27 DIAGNOSIS — Z7689 Persons encountering health services in other specified circumstances: Secondary | ICD-10-CM | POA: Diagnosis not present

## 2018-10-30 DIAGNOSIS — I10 Essential (primary) hypertension: Secondary | ICD-10-CM | POA: Diagnosis not present

## 2018-10-30 DIAGNOSIS — Z7689 Persons encountering health services in other specified circumstances: Secondary | ICD-10-CM | POA: Diagnosis not present

## 2018-10-30 DIAGNOSIS — R002 Palpitations: Secondary | ICD-10-CM | POA: Diagnosis not present

## 2018-10-30 DIAGNOSIS — I38 Endocarditis, valve unspecified: Secondary | ICD-10-CM | POA: Diagnosis not present

## 2018-10-30 DIAGNOSIS — R079 Chest pain, unspecified: Secondary | ICD-10-CM | POA: Diagnosis not present

## 2018-11-01 DIAGNOSIS — G8929 Other chronic pain: Secondary | ICD-10-CM | POA: Diagnosis not present

## 2018-11-01 DIAGNOSIS — Z7689 Persons encountering health services in other specified circumstances: Secondary | ICD-10-CM | POA: Diagnosis not present

## 2018-11-01 DIAGNOSIS — F411 Generalized anxiety disorder: Secondary | ICD-10-CM | POA: Insufficient documentation

## 2018-11-01 DIAGNOSIS — E559 Vitamin D deficiency, unspecified: Secondary | ICD-10-CM | POA: Diagnosis not present

## 2018-11-01 DIAGNOSIS — I10 Essential (primary) hypertension: Secondary | ICD-10-CM | POA: Diagnosis not present

## 2018-11-01 DIAGNOSIS — Z8679 Personal history of other diseases of the circulatory system: Secondary | ICD-10-CM | POA: Diagnosis not present

## 2018-11-01 DIAGNOSIS — J449 Chronic obstructive pulmonary disease, unspecified: Secondary | ICD-10-CM | POA: Diagnosis not present

## 2018-11-01 DIAGNOSIS — R300 Dysuria: Secondary | ICD-10-CM | POA: Diagnosis not present

## 2018-11-01 DIAGNOSIS — M25561 Pain in right knee: Secondary | ICD-10-CM | POA: Diagnosis not present

## 2018-11-01 DIAGNOSIS — F3342 Major depressive disorder, recurrent, in full remission: Secondary | ICD-10-CM | POA: Diagnosis not present

## 2018-11-01 DIAGNOSIS — Z1211 Encounter for screening for malignant neoplasm of colon: Secondary | ICD-10-CM | POA: Diagnosis not present

## 2018-11-01 DIAGNOSIS — Z0000 Encounter for general adult medical examination without abnormal findings: Secondary | ICD-10-CM | POA: Diagnosis not present

## 2018-11-03 DIAGNOSIS — Z7689 Persons encountering health services in other specified circumstances: Secondary | ICD-10-CM | POA: Diagnosis not present

## 2018-11-07 DIAGNOSIS — M1711 Unilateral primary osteoarthritis, right knee: Secondary | ICD-10-CM | POA: Diagnosis not present

## 2018-11-07 DIAGNOSIS — M11261 Other chondrocalcinosis, right knee: Secondary | ICD-10-CM | POA: Diagnosis not present

## 2018-11-07 DIAGNOSIS — H40033 Anatomical narrow angle, bilateral: Secondary | ICD-10-CM | POA: Diagnosis not present

## 2018-11-07 DIAGNOSIS — Z7689 Persons encountering health services in other specified circumstances: Secondary | ICD-10-CM | POA: Diagnosis not present

## 2018-11-07 DIAGNOSIS — G8929 Other chronic pain: Secondary | ICD-10-CM | POA: Diagnosis not present

## 2018-11-07 DIAGNOSIS — R52 Pain, unspecified: Secondary | ICD-10-CM | POA: Diagnosis not present

## 2018-11-07 DIAGNOSIS — M25561 Pain in right knee: Secondary | ICD-10-CM | POA: Diagnosis not present

## 2018-11-07 DIAGNOSIS — H16223 Keratoconjunctivitis sicca, not specified as Sjogren's, bilateral: Secondary | ICD-10-CM | POA: Diagnosis not present

## 2018-11-16 DIAGNOSIS — Z7689 Persons encountering health services in other specified circumstances: Secondary | ICD-10-CM | POA: Diagnosis not present

## 2018-11-23 DIAGNOSIS — Z7689 Persons encountering health services in other specified circumstances: Secondary | ICD-10-CM | POA: Diagnosis not present

## 2018-12-06 DIAGNOSIS — S93601A Unspecified sprain of right foot, initial encounter: Secondary | ICD-10-CM | POA: Diagnosis not present

## 2018-12-06 DIAGNOSIS — M79671 Pain in right foot: Secondary | ICD-10-CM | POA: Diagnosis not present

## 2018-12-06 DIAGNOSIS — S99921A Unspecified injury of right foot, initial encounter: Secondary | ICD-10-CM | POA: Diagnosis not present

## 2018-12-08 DIAGNOSIS — H5213 Myopia, bilateral: Secondary | ICD-10-CM | POA: Diagnosis not present

## 2018-12-14 DIAGNOSIS — Z7689 Persons encountering health services in other specified circumstances: Secondary | ICD-10-CM | POA: Diagnosis not present

## 2018-12-15 DIAGNOSIS — Z7689 Persons encountering health services in other specified circumstances: Secondary | ICD-10-CM | POA: Diagnosis not present

## 2018-12-21 DIAGNOSIS — I639 Cerebral infarction, unspecified: Secondary | ICD-10-CM | POA: Diagnosis not present

## 2018-12-25 DIAGNOSIS — I639 Cerebral infarction, unspecified: Secondary | ICD-10-CM | POA: Diagnosis not present

## 2018-12-29 DIAGNOSIS — M1711 Unilateral primary osteoarthritis, right knee: Secondary | ICD-10-CM | POA: Diagnosis not present

## 2018-12-29 DIAGNOSIS — Z7689 Persons encountering health services in other specified circumstances: Secondary | ICD-10-CM | POA: Diagnosis not present

## 2019-01-01 DIAGNOSIS — I639 Cerebral infarction, unspecified: Secondary | ICD-10-CM | POA: Diagnosis not present

## 2019-01-03 DIAGNOSIS — Z8673 Personal history of transient ischemic attack (TIA), and cerebral infarction without residual deficits: Secondary | ICD-10-CM | POA: Diagnosis not present

## 2019-01-03 DIAGNOSIS — I10 Essential (primary) hypertension: Secondary | ICD-10-CM | POA: Diagnosis not present

## 2019-01-03 DIAGNOSIS — I330 Acute and subacute infective endocarditis: Secondary | ICD-10-CM | POA: Diagnosis not present

## 2019-01-03 DIAGNOSIS — I609 Nontraumatic subarachnoid hemorrhage, unspecified: Secondary | ICD-10-CM | POA: Diagnosis not present

## 2019-01-03 DIAGNOSIS — Z7689 Persons encountering health services in other specified circumstances: Secondary | ICD-10-CM | POA: Diagnosis not present

## 2019-01-08 DIAGNOSIS — I639 Cerebral infarction, unspecified: Secondary | ICD-10-CM | POA: Diagnosis not present

## 2019-01-12 DIAGNOSIS — I639 Cerebral infarction, unspecified: Secondary | ICD-10-CM | POA: Diagnosis not present

## 2019-01-15 DIAGNOSIS — I639 Cerebral infarction, unspecified: Secondary | ICD-10-CM | POA: Diagnosis not present

## 2019-01-15 DIAGNOSIS — Z7689 Persons encountering health services in other specified circumstances: Secondary | ICD-10-CM | POA: Diagnosis not present

## 2019-01-16 DIAGNOSIS — I639 Cerebral infarction, unspecified: Secondary | ICD-10-CM | POA: Diagnosis not present

## 2019-01-17 DIAGNOSIS — I639 Cerebral infarction, unspecified: Secondary | ICD-10-CM | POA: Diagnosis not present

## 2019-01-18 DIAGNOSIS — Z7689 Persons encountering health services in other specified circumstances: Secondary | ICD-10-CM | POA: Diagnosis not present

## 2019-01-18 DIAGNOSIS — I639 Cerebral infarction, unspecified: Secondary | ICD-10-CM | POA: Diagnosis not present

## 2019-01-19 DIAGNOSIS — I639 Cerebral infarction, unspecified: Secondary | ICD-10-CM | POA: Diagnosis not present

## 2019-01-20 DIAGNOSIS — I639 Cerebral infarction, unspecified: Secondary | ICD-10-CM | POA: Diagnosis not present

## 2019-01-21 DIAGNOSIS — I639 Cerebral infarction, unspecified: Secondary | ICD-10-CM | POA: Diagnosis not present

## 2019-01-22 DIAGNOSIS — I639 Cerebral infarction, unspecified: Secondary | ICD-10-CM | POA: Diagnosis not present

## 2019-01-23 DIAGNOSIS — I639 Cerebral infarction, unspecified: Secondary | ICD-10-CM | POA: Diagnosis not present

## 2019-01-24 DIAGNOSIS — I639 Cerebral infarction, unspecified: Secondary | ICD-10-CM | POA: Diagnosis not present

## 2019-01-25 DIAGNOSIS — I639 Cerebral infarction, unspecified: Secondary | ICD-10-CM | POA: Diagnosis not present

## 2019-01-26 DIAGNOSIS — I639 Cerebral infarction, unspecified: Secondary | ICD-10-CM | POA: Diagnosis not present

## 2019-01-27 DIAGNOSIS — I639 Cerebral infarction, unspecified: Secondary | ICD-10-CM | POA: Diagnosis not present

## 2019-01-28 DIAGNOSIS — I639 Cerebral infarction, unspecified: Secondary | ICD-10-CM | POA: Diagnosis not present

## 2019-01-29 DIAGNOSIS — I639 Cerebral infarction, unspecified: Secondary | ICD-10-CM | POA: Diagnosis not present

## 2019-01-30 DIAGNOSIS — Z01419 Encounter for gynecological examination (general) (routine) without abnormal findings: Secondary | ICD-10-CM | POA: Diagnosis not present

## 2019-01-30 DIAGNOSIS — I639 Cerebral infarction, unspecified: Secondary | ICD-10-CM | POA: Diagnosis not present

## 2019-01-30 DIAGNOSIS — Z7689 Persons encountering health services in other specified circumstances: Secondary | ICD-10-CM | POA: Diagnosis not present

## 2019-01-31 DIAGNOSIS — I639 Cerebral infarction, unspecified: Secondary | ICD-10-CM | POA: Diagnosis not present

## 2019-02-01 DIAGNOSIS — I639 Cerebral infarction, unspecified: Secondary | ICD-10-CM | POA: Diagnosis not present

## 2019-02-01 DIAGNOSIS — Z7689 Persons encountering health services in other specified circumstances: Secondary | ICD-10-CM | POA: Diagnosis not present

## 2019-02-02 DIAGNOSIS — I639 Cerebral infarction, unspecified: Secondary | ICD-10-CM | POA: Diagnosis not present

## 2019-02-03 DIAGNOSIS — I639 Cerebral infarction, unspecified: Secondary | ICD-10-CM | POA: Diagnosis not present

## 2019-02-04 DIAGNOSIS — I639 Cerebral infarction, unspecified: Secondary | ICD-10-CM | POA: Diagnosis not present

## 2019-02-05 DIAGNOSIS — I639 Cerebral infarction, unspecified: Secondary | ICD-10-CM | POA: Diagnosis not present

## 2019-02-06 DIAGNOSIS — I639 Cerebral infarction, unspecified: Secondary | ICD-10-CM | POA: Diagnosis not present

## 2019-02-07 DIAGNOSIS — I639 Cerebral infarction, unspecified: Secondary | ICD-10-CM | POA: Diagnosis not present

## 2019-02-08 DIAGNOSIS — I639 Cerebral infarction, unspecified: Secondary | ICD-10-CM | POA: Diagnosis not present

## 2019-02-09 DIAGNOSIS — I639 Cerebral infarction, unspecified: Secondary | ICD-10-CM | POA: Diagnosis not present

## 2019-02-10 DIAGNOSIS — I639 Cerebral infarction, unspecified: Secondary | ICD-10-CM | POA: Diagnosis not present

## 2019-02-11 DIAGNOSIS — I639 Cerebral infarction, unspecified: Secondary | ICD-10-CM | POA: Diagnosis not present

## 2019-02-12 DIAGNOSIS — I639 Cerebral infarction, unspecified: Secondary | ICD-10-CM | POA: Diagnosis not present

## 2019-02-12 DIAGNOSIS — Z7689 Persons encountering health services in other specified circumstances: Secondary | ICD-10-CM | POA: Diagnosis not present

## 2019-02-13 DIAGNOSIS — I639 Cerebral infarction, unspecified: Secondary | ICD-10-CM | POA: Diagnosis not present

## 2019-02-14 DIAGNOSIS — I639 Cerebral infarction, unspecified: Secondary | ICD-10-CM | POA: Diagnosis not present

## 2019-02-15 DIAGNOSIS — I639 Cerebral infarction, unspecified: Secondary | ICD-10-CM | POA: Diagnosis not present

## 2019-02-15 DIAGNOSIS — Z7689 Persons encountering health services in other specified circumstances: Secondary | ICD-10-CM | POA: Diagnosis not present

## 2019-02-16 DIAGNOSIS — I639 Cerebral infarction, unspecified: Secondary | ICD-10-CM | POA: Diagnosis not present

## 2019-02-16 DIAGNOSIS — Z7689 Persons encountering health services in other specified circumstances: Secondary | ICD-10-CM | POA: Diagnosis not present

## 2019-02-17 DIAGNOSIS — I639 Cerebral infarction, unspecified: Secondary | ICD-10-CM | POA: Diagnosis not present

## 2019-02-18 DIAGNOSIS — I639 Cerebral infarction, unspecified: Secondary | ICD-10-CM | POA: Diagnosis not present

## 2019-02-19 DIAGNOSIS — I639 Cerebral infarction, unspecified: Secondary | ICD-10-CM | POA: Diagnosis not present

## 2019-02-20 DIAGNOSIS — R0602 Shortness of breath: Secondary | ICD-10-CM | POA: Diagnosis not present

## 2019-02-20 DIAGNOSIS — I639 Cerebral infarction, unspecified: Secondary | ICD-10-CM | POA: Diagnosis not present

## 2019-02-20 DIAGNOSIS — R5383 Other fatigue: Secondary | ICD-10-CM | POA: Diagnosis not present

## 2019-02-20 DIAGNOSIS — R197 Diarrhea, unspecified: Secondary | ICD-10-CM | POA: Diagnosis not present

## 2019-02-21 DIAGNOSIS — I639 Cerebral infarction, unspecified: Secondary | ICD-10-CM | POA: Diagnosis not present

## 2019-02-22 DIAGNOSIS — I639 Cerebral infarction, unspecified: Secondary | ICD-10-CM | POA: Diagnosis not present

## 2019-02-23 DIAGNOSIS — I639 Cerebral infarction, unspecified: Secondary | ICD-10-CM | POA: Diagnosis not present

## 2019-02-24 DIAGNOSIS — I639 Cerebral infarction, unspecified: Secondary | ICD-10-CM | POA: Diagnosis not present

## 2019-02-25 DIAGNOSIS — I639 Cerebral infarction, unspecified: Secondary | ICD-10-CM | POA: Diagnosis not present

## 2019-02-26 DIAGNOSIS — I639 Cerebral infarction, unspecified: Secondary | ICD-10-CM | POA: Diagnosis not present

## 2019-02-27 DIAGNOSIS — I639 Cerebral infarction, unspecified: Secondary | ICD-10-CM | POA: Diagnosis not present

## 2019-02-28 DIAGNOSIS — I639 Cerebral infarction, unspecified: Secondary | ICD-10-CM | POA: Diagnosis not present

## 2019-03-01 DIAGNOSIS — I639 Cerebral infarction, unspecified: Secondary | ICD-10-CM | POA: Diagnosis not present

## 2019-03-02 DIAGNOSIS — I639 Cerebral infarction, unspecified: Secondary | ICD-10-CM | POA: Diagnosis not present

## 2019-03-03 DIAGNOSIS — I639 Cerebral infarction, unspecified: Secondary | ICD-10-CM | POA: Diagnosis not present

## 2019-03-04 DIAGNOSIS — I639 Cerebral infarction, unspecified: Secondary | ICD-10-CM | POA: Diagnosis not present

## 2019-03-05 DIAGNOSIS — I639 Cerebral infarction, unspecified: Secondary | ICD-10-CM | POA: Diagnosis not present

## 2019-03-06 DIAGNOSIS — I639 Cerebral infarction, unspecified: Secondary | ICD-10-CM | POA: Diagnosis not present

## 2019-03-07 DIAGNOSIS — I639 Cerebral infarction, unspecified: Secondary | ICD-10-CM | POA: Diagnosis not present

## 2019-03-08 DIAGNOSIS — Z7689 Persons encountering health services in other specified circumstances: Secondary | ICD-10-CM | POA: Diagnosis not present

## 2019-03-08 DIAGNOSIS — I639 Cerebral infarction, unspecified: Secondary | ICD-10-CM | POA: Diagnosis not present

## 2019-03-09 DIAGNOSIS — I639 Cerebral infarction, unspecified: Secondary | ICD-10-CM | POA: Diagnosis not present

## 2019-03-10 DIAGNOSIS — I639 Cerebral infarction, unspecified: Secondary | ICD-10-CM | POA: Diagnosis not present

## 2019-03-11 DIAGNOSIS — I639 Cerebral infarction, unspecified: Secondary | ICD-10-CM | POA: Diagnosis not present

## 2019-03-12 DIAGNOSIS — I639 Cerebral infarction, unspecified: Secondary | ICD-10-CM | POA: Diagnosis not present

## 2019-03-13 DIAGNOSIS — I639 Cerebral infarction, unspecified: Secondary | ICD-10-CM | POA: Diagnosis not present

## 2019-03-14 DIAGNOSIS — I639 Cerebral infarction, unspecified: Secondary | ICD-10-CM | POA: Diagnosis not present

## 2019-03-15 DIAGNOSIS — I639 Cerebral infarction, unspecified: Secondary | ICD-10-CM | POA: Diagnosis not present

## 2019-03-16 DIAGNOSIS — I639 Cerebral infarction, unspecified: Secondary | ICD-10-CM | POA: Diagnosis not present

## 2019-03-17 DIAGNOSIS — I639 Cerebral infarction, unspecified: Secondary | ICD-10-CM | POA: Diagnosis not present

## 2019-03-18 DIAGNOSIS — I639 Cerebral infarction, unspecified: Secondary | ICD-10-CM | POA: Diagnosis not present

## 2019-03-19 DIAGNOSIS — I639 Cerebral infarction, unspecified: Secondary | ICD-10-CM | POA: Diagnosis not present

## 2019-03-20 DIAGNOSIS — I639 Cerebral infarction, unspecified: Secondary | ICD-10-CM | POA: Diagnosis not present

## 2019-03-21 DIAGNOSIS — I639 Cerebral infarction, unspecified: Secondary | ICD-10-CM | POA: Diagnosis not present

## 2019-03-22 DIAGNOSIS — Z7689 Persons encountering health services in other specified circumstances: Secondary | ICD-10-CM | POA: Diagnosis not present

## 2019-03-22 DIAGNOSIS — I639 Cerebral infarction, unspecified: Secondary | ICD-10-CM | POA: Diagnosis not present

## 2019-03-23 DIAGNOSIS — I639 Cerebral infarction, unspecified: Secondary | ICD-10-CM | POA: Diagnosis not present

## 2019-03-24 DIAGNOSIS — I639 Cerebral infarction, unspecified: Secondary | ICD-10-CM | POA: Diagnosis not present

## 2019-03-25 DIAGNOSIS — I639 Cerebral infarction, unspecified: Secondary | ICD-10-CM | POA: Diagnosis not present

## 2019-03-26 DIAGNOSIS — I639 Cerebral infarction, unspecified: Secondary | ICD-10-CM | POA: Diagnosis not present

## 2019-03-27 DIAGNOSIS — I639 Cerebral infarction, unspecified: Secondary | ICD-10-CM | POA: Diagnosis not present

## 2019-03-28 DIAGNOSIS — I639 Cerebral infarction, unspecified: Secondary | ICD-10-CM | POA: Diagnosis not present

## 2019-03-29 DIAGNOSIS — M25551 Pain in right hip: Secondary | ICD-10-CM | POA: Diagnosis not present

## 2019-03-29 DIAGNOSIS — M160 Bilateral primary osteoarthritis of hip: Secondary | ICD-10-CM | POA: Diagnosis not present

## 2019-03-29 DIAGNOSIS — M25552 Pain in left hip: Secondary | ICD-10-CM | POA: Diagnosis not present

## 2019-03-29 DIAGNOSIS — I639 Cerebral infarction, unspecified: Secondary | ICD-10-CM | POA: Diagnosis not present

## 2019-03-30 DIAGNOSIS — I639 Cerebral infarction, unspecified: Secondary | ICD-10-CM | POA: Diagnosis not present

## 2019-03-31 DIAGNOSIS — I639 Cerebral infarction, unspecified: Secondary | ICD-10-CM | POA: Diagnosis not present

## 2019-04-01 DIAGNOSIS — I639 Cerebral infarction, unspecified: Secondary | ICD-10-CM | POA: Diagnosis not present

## 2019-04-02 DIAGNOSIS — I639 Cerebral infarction, unspecified: Secondary | ICD-10-CM | POA: Diagnosis not present

## 2019-04-03 DIAGNOSIS — I639 Cerebral infarction, unspecified: Secondary | ICD-10-CM | POA: Diagnosis not present

## 2019-04-04 DIAGNOSIS — I639 Cerebral infarction, unspecified: Secondary | ICD-10-CM | POA: Diagnosis not present

## 2019-04-05 DIAGNOSIS — I639 Cerebral infarction, unspecified: Secondary | ICD-10-CM | POA: Diagnosis not present

## 2019-04-06 DIAGNOSIS — I639 Cerebral infarction, unspecified: Secondary | ICD-10-CM | POA: Diagnosis not present

## 2019-04-07 DIAGNOSIS — I639 Cerebral infarction, unspecified: Secondary | ICD-10-CM | POA: Diagnosis not present

## 2019-04-08 DIAGNOSIS — I639 Cerebral infarction, unspecified: Secondary | ICD-10-CM | POA: Diagnosis not present

## 2019-04-09 DIAGNOSIS — I639 Cerebral infarction, unspecified: Secondary | ICD-10-CM | POA: Diagnosis not present

## 2019-04-10 DIAGNOSIS — I639 Cerebral infarction, unspecified: Secondary | ICD-10-CM | POA: Diagnosis not present

## 2019-04-11 DIAGNOSIS — I639 Cerebral infarction, unspecified: Secondary | ICD-10-CM | POA: Diagnosis not present

## 2019-04-12 DIAGNOSIS — I639 Cerebral infarction, unspecified: Secondary | ICD-10-CM | POA: Diagnosis not present

## 2019-04-13 DIAGNOSIS — I639 Cerebral infarction, unspecified: Secondary | ICD-10-CM | POA: Diagnosis not present

## 2019-04-14 DIAGNOSIS — I639 Cerebral infarction, unspecified: Secondary | ICD-10-CM | POA: Diagnosis not present

## 2019-04-15 DIAGNOSIS — I639 Cerebral infarction, unspecified: Secondary | ICD-10-CM | POA: Diagnosis not present

## 2019-04-16 DIAGNOSIS — I639 Cerebral infarction, unspecified: Secondary | ICD-10-CM | POA: Diagnosis not present

## 2019-04-17 DIAGNOSIS — I639 Cerebral infarction, unspecified: Secondary | ICD-10-CM | POA: Diagnosis not present

## 2019-04-18 DIAGNOSIS — I639 Cerebral infarction, unspecified: Secondary | ICD-10-CM | POA: Diagnosis not present

## 2019-04-19 DIAGNOSIS — I639 Cerebral infarction, unspecified: Secondary | ICD-10-CM | POA: Diagnosis not present

## 2019-04-20 DIAGNOSIS — I639 Cerebral infarction, unspecified: Secondary | ICD-10-CM | POA: Diagnosis not present

## 2019-04-21 DIAGNOSIS — I639 Cerebral infarction, unspecified: Secondary | ICD-10-CM | POA: Diagnosis not present

## 2019-04-22 DIAGNOSIS — I639 Cerebral infarction, unspecified: Secondary | ICD-10-CM | POA: Diagnosis not present

## 2019-04-23 DIAGNOSIS — I639 Cerebral infarction, unspecified: Secondary | ICD-10-CM | POA: Diagnosis not present

## 2019-04-24 DIAGNOSIS — I639 Cerebral infarction, unspecified: Secondary | ICD-10-CM | POA: Diagnosis not present

## 2019-04-25 DIAGNOSIS — I639 Cerebral infarction, unspecified: Secondary | ICD-10-CM | POA: Diagnosis not present

## 2019-04-26 DIAGNOSIS — I639 Cerebral infarction, unspecified: Secondary | ICD-10-CM | POA: Diagnosis not present

## 2019-04-27 DIAGNOSIS — I639 Cerebral infarction, unspecified: Secondary | ICD-10-CM | POA: Diagnosis not present

## 2019-04-28 DIAGNOSIS — I639 Cerebral infarction, unspecified: Secondary | ICD-10-CM | POA: Diagnosis not present

## 2019-04-29 DIAGNOSIS — I639 Cerebral infarction, unspecified: Secondary | ICD-10-CM | POA: Diagnosis not present

## 2019-04-30 DIAGNOSIS — I639 Cerebral infarction, unspecified: Secondary | ICD-10-CM | POA: Diagnosis not present

## 2019-05-01 DIAGNOSIS — I639 Cerebral infarction, unspecified: Secondary | ICD-10-CM | POA: Diagnosis not present

## 2019-05-02 DIAGNOSIS — I639 Cerebral infarction, unspecified: Secondary | ICD-10-CM | POA: Diagnosis not present

## 2019-05-03 DIAGNOSIS — I639 Cerebral infarction, unspecified: Secondary | ICD-10-CM | POA: Diagnosis not present

## 2019-05-04 DIAGNOSIS — I639 Cerebral infarction, unspecified: Secondary | ICD-10-CM | POA: Diagnosis not present

## 2019-05-05 DIAGNOSIS — I639 Cerebral infarction, unspecified: Secondary | ICD-10-CM | POA: Diagnosis not present

## 2019-05-06 DIAGNOSIS — I639 Cerebral infarction, unspecified: Secondary | ICD-10-CM | POA: Diagnosis not present

## 2019-05-07 DIAGNOSIS — I639 Cerebral infarction, unspecified: Secondary | ICD-10-CM | POA: Diagnosis not present

## 2019-05-08 DIAGNOSIS — I639 Cerebral infarction, unspecified: Secondary | ICD-10-CM | POA: Diagnosis not present

## 2019-05-09 DIAGNOSIS — I639 Cerebral infarction, unspecified: Secondary | ICD-10-CM | POA: Diagnosis not present

## 2019-05-10 DIAGNOSIS — I639 Cerebral infarction, unspecified: Secondary | ICD-10-CM | POA: Diagnosis not present

## 2019-05-11 DIAGNOSIS — I639 Cerebral infarction, unspecified: Secondary | ICD-10-CM | POA: Diagnosis not present

## 2019-05-12 DIAGNOSIS — I639 Cerebral infarction, unspecified: Secondary | ICD-10-CM | POA: Diagnosis not present

## 2019-05-13 DIAGNOSIS — I639 Cerebral infarction, unspecified: Secondary | ICD-10-CM | POA: Diagnosis not present

## 2019-05-14 DIAGNOSIS — I639 Cerebral infarction, unspecified: Secondary | ICD-10-CM | POA: Diagnosis not present

## 2019-05-15 DIAGNOSIS — I639 Cerebral infarction, unspecified: Secondary | ICD-10-CM | POA: Diagnosis not present

## 2019-05-16 DIAGNOSIS — I639 Cerebral infarction, unspecified: Secondary | ICD-10-CM | POA: Diagnosis not present

## 2019-05-17 DIAGNOSIS — I639 Cerebral infarction, unspecified: Secondary | ICD-10-CM | POA: Diagnosis not present

## 2019-05-18 DIAGNOSIS — I639 Cerebral infarction, unspecified: Secondary | ICD-10-CM | POA: Diagnosis not present

## 2019-05-19 DIAGNOSIS — I639 Cerebral infarction, unspecified: Secondary | ICD-10-CM | POA: Diagnosis not present

## 2019-05-20 DIAGNOSIS — I639 Cerebral infarction, unspecified: Secondary | ICD-10-CM | POA: Diagnosis not present

## 2019-05-21 DIAGNOSIS — I639 Cerebral infarction, unspecified: Secondary | ICD-10-CM | POA: Diagnosis not present

## 2019-05-22 DIAGNOSIS — I639 Cerebral infarction, unspecified: Secondary | ICD-10-CM | POA: Diagnosis not present

## 2019-05-23 DIAGNOSIS — I639 Cerebral infarction, unspecified: Secondary | ICD-10-CM | POA: Diagnosis not present

## 2019-05-24 DIAGNOSIS — I639 Cerebral infarction, unspecified: Secondary | ICD-10-CM | POA: Diagnosis not present

## 2019-05-25 DIAGNOSIS — I639 Cerebral infarction, unspecified: Secondary | ICD-10-CM | POA: Diagnosis not present

## 2019-05-26 DIAGNOSIS — I639 Cerebral infarction, unspecified: Secondary | ICD-10-CM | POA: Diagnosis not present

## 2019-05-27 DIAGNOSIS — I639 Cerebral infarction, unspecified: Secondary | ICD-10-CM | POA: Diagnosis not present

## 2019-05-28 DIAGNOSIS — I639 Cerebral infarction, unspecified: Secondary | ICD-10-CM | POA: Diagnosis not present

## 2019-05-29 DIAGNOSIS — I639 Cerebral infarction, unspecified: Secondary | ICD-10-CM | POA: Diagnosis not present

## 2019-05-30 DIAGNOSIS — I639 Cerebral infarction, unspecified: Secondary | ICD-10-CM | POA: Diagnosis not present

## 2019-05-31 DIAGNOSIS — I639 Cerebral infarction, unspecified: Secondary | ICD-10-CM | POA: Diagnosis not present

## 2019-06-01 DIAGNOSIS — I639 Cerebral infarction, unspecified: Secondary | ICD-10-CM | POA: Diagnosis not present

## 2019-06-02 DIAGNOSIS — I639 Cerebral infarction, unspecified: Secondary | ICD-10-CM | POA: Diagnosis not present

## 2019-06-03 DIAGNOSIS — I639 Cerebral infarction, unspecified: Secondary | ICD-10-CM | POA: Diagnosis not present

## 2019-06-04 DIAGNOSIS — I639 Cerebral infarction, unspecified: Secondary | ICD-10-CM | POA: Diagnosis not present

## 2019-06-05 DIAGNOSIS — I639 Cerebral infarction, unspecified: Secondary | ICD-10-CM | POA: Diagnosis not present

## 2019-06-06 DIAGNOSIS — I639 Cerebral infarction, unspecified: Secondary | ICD-10-CM | POA: Diagnosis not present

## 2019-06-07 DIAGNOSIS — I639 Cerebral infarction, unspecified: Secondary | ICD-10-CM | POA: Diagnosis not present

## 2019-06-08 DIAGNOSIS — I639 Cerebral infarction, unspecified: Secondary | ICD-10-CM | POA: Diagnosis not present

## 2019-06-09 DIAGNOSIS — I639 Cerebral infarction, unspecified: Secondary | ICD-10-CM | POA: Diagnosis not present

## 2019-06-10 DIAGNOSIS — I639 Cerebral infarction, unspecified: Secondary | ICD-10-CM | POA: Diagnosis not present

## 2019-06-11 DIAGNOSIS — I639 Cerebral infarction, unspecified: Secondary | ICD-10-CM | POA: Diagnosis not present

## 2019-06-12 DIAGNOSIS — I639 Cerebral infarction, unspecified: Secondary | ICD-10-CM | POA: Diagnosis not present

## 2019-06-13 DIAGNOSIS — I639 Cerebral infarction, unspecified: Secondary | ICD-10-CM | POA: Diagnosis not present

## 2019-06-14 DIAGNOSIS — I639 Cerebral infarction, unspecified: Secondary | ICD-10-CM | POA: Diagnosis not present

## 2019-06-15 DIAGNOSIS — I639 Cerebral infarction, unspecified: Secondary | ICD-10-CM | POA: Diagnosis not present

## 2019-06-16 DIAGNOSIS — I639 Cerebral infarction, unspecified: Secondary | ICD-10-CM | POA: Diagnosis not present

## 2019-06-17 DIAGNOSIS — I639 Cerebral infarction, unspecified: Secondary | ICD-10-CM | POA: Diagnosis not present

## 2019-06-18 DIAGNOSIS — I639 Cerebral infarction, unspecified: Secondary | ICD-10-CM | POA: Diagnosis not present

## 2019-06-19 DIAGNOSIS — I639 Cerebral infarction, unspecified: Secondary | ICD-10-CM | POA: Diagnosis not present

## 2019-07-17 DIAGNOSIS — B182 Chronic viral hepatitis C: Secondary | ICD-10-CM | POA: Diagnosis not present

## 2019-07-18 DIAGNOSIS — B182 Chronic viral hepatitis C: Secondary | ICD-10-CM | POA: Diagnosis not present

## 2019-07-19 DIAGNOSIS — B182 Chronic viral hepatitis C: Secondary | ICD-10-CM | POA: Diagnosis not present

## 2019-07-20 DIAGNOSIS — B182 Chronic viral hepatitis C: Secondary | ICD-10-CM | POA: Diagnosis not present

## 2019-07-21 DIAGNOSIS — B182 Chronic viral hepatitis C: Secondary | ICD-10-CM | POA: Diagnosis not present

## 2019-07-22 DIAGNOSIS — B182 Chronic viral hepatitis C: Secondary | ICD-10-CM | POA: Diagnosis not present

## 2019-07-23 DIAGNOSIS — B182 Chronic viral hepatitis C: Secondary | ICD-10-CM | POA: Diagnosis not present

## 2019-07-24 DIAGNOSIS — B182 Chronic viral hepatitis C: Secondary | ICD-10-CM | POA: Diagnosis not present

## 2019-07-25 DIAGNOSIS — B182 Chronic viral hepatitis C: Secondary | ICD-10-CM | POA: Diagnosis not present

## 2019-07-26 DIAGNOSIS — B182 Chronic viral hepatitis C: Secondary | ICD-10-CM | POA: Diagnosis not present

## 2019-07-27 DIAGNOSIS — B182 Chronic viral hepatitis C: Secondary | ICD-10-CM | POA: Diagnosis not present

## 2019-07-28 DIAGNOSIS — B182 Chronic viral hepatitis C: Secondary | ICD-10-CM | POA: Diagnosis not present

## 2019-07-29 DIAGNOSIS — B182 Chronic viral hepatitis C: Secondary | ICD-10-CM | POA: Diagnosis not present

## 2019-07-30 DIAGNOSIS — B182 Chronic viral hepatitis C: Secondary | ICD-10-CM | POA: Diagnosis not present

## 2019-07-31 DIAGNOSIS — B182 Chronic viral hepatitis C: Secondary | ICD-10-CM | POA: Diagnosis not present

## 2019-08-01 DIAGNOSIS — B182 Chronic viral hepatitis C: Secondary | ICD-10-CM | POA: Diagnosis not present

## 2019-08-02 DIAGNOSIS — B182 Chronic viral hepatitis C: Secondary | ICD-10-CM | POA: Diagnosis not present

## 2019-08-03 DIAGNOSIS — B182 Chronic viral hepatitis C: Secondary | ICD-10-CM | POA: Diagnosis not present

## 2019-08-04 DIAGNOSIS — B182 Chronic viral hepatitis C: Secondary | ICD-10-CM | POA: Diagnosis not present

## 2019-08-05 DIAGNOSIS — B182 Chronic viral hepatitis C: Secondary | ICD-10-CM | POA: Diagnosis not present

## 2019-08-06 DIAGNOSIS — B182 Chronic viral hepatitis C: Secondary | ICD-10-CM | POA: Diagnosis not present

## 2019-08-07 DIAGNOSIS — B182 Chronic viral hepatitis C: Secondary | ICD-10-CM | POA: Diagnosis not present

## 2019-08-08 DIAGNOSIS — B182 Chronic viral hepatitis C: Secondary | ICD-10-CM | POA: Diagnosis not present

## 2019-08-09 DIAGNOSIS — B182 Chronic viral hepatitis C: Secondary | ICD-10-CM | POA: Diagnosis not present

## 2019-08-10 DIAGNOSIS — B182 Chronic viral hepatitis C: Secondary | ICD-10-CM | POA: Diagnosis not present

## 2019-08-11 DIAGNOSIS — B182 Chronic viral hepatitis C: Secondary | ICD-10-CM | POA: Diagnosis not present

## 2019-08-12 DIAGNOSIS — B182 Chronic viral hepatitis C: Secondary | ICD-10-CM | POA: Diagnosis not present

## 2019-08-13 DIAGNOSIS — B182 Chronic viral hepatitis C: Secondary | ICD-10-CM | POA: Diagnosis not present

## 2019-08-14 DIAGNOSIS — B182 Chronic viral hepatitis C: Secondary | ICD-10-CM | POA: Diagnosis not present

## 2019-08-15 DIAGNOSIS — B182 Chronic viral hepatitis C: Secondary | ICD-10-CM | POA: Diagnosis not present

## 2019-08-16 DIAGNOSIS — B182 Chronic viral hepatitis C: Secondary | ICD-10-CM | POA: Diagnosis not present

## 2019-08-17 DIAGNOSIS — B182 Chronic viral hepatitis C: Secondary | ICD-10-CM | POA: Diagnosis not present

## 2019-08-18 DIAGNOSIS — B182 Chronic viral hepatitis C: Secondary | ICD-10-CM | POA: Diagnosis not present

## 2019-08-19 DIAGNOSIS — B182 Chronic viral hepatitis C: Secondary | ICD-10-CM | POA: Diagnosis not present

## 2019-08-20 DIAGNOSIS — B182 Chronic viral hepatitis C: Secondary | ICD-10-CM | POA: Diagnosis not present

## 2019-08-21 DIAGNOSIS — B182 Chronic viral hepatitis C: Secondary | ICD-10-CM | POA: Diagnosis not present

## 2019-08-22 DIAGNOSIS — B182 Chronic viral hepatitis C: Secondary | ICD-10-CM | POA: Diagnosis not present

## 2019-08-23 DIAGNOSIS — B182 Chronic viral hepatitis C: Secondary | ICD-10-CM | POA: Diagnosis not present

## 2019-08-24 DIAGNOSIS — B182 Chronic viral hepatitis C: Secondary | ICD-10-CM | POA: Diagnosis not present

## 2019-08-25 DIAGNOSIS — B182 Chronic viral hepatitis C: Secondary | ICD-10-CM | POA: Diagnosis not present

## 2019-08-26 DIAGNOSIS — B182 Chronic viral hepatitis C: Secondary | ICD-10-CM | POA: Diagnosis not present

## 2019-08-27 DIAGNOSIS — B182 Chronic viral hepatitis C: Secondary | ICD-10-CM | POA: Diagnosis not present

## 2019-08-28 DIAGNOSIS — B182 Chronic viral hepatitis C: Secondary | ICD-10-CM | POA: Diagnosis not present

## 2019-08-29 DIAGNOSIS — B182 Chronic viral hepatitis C: Secondary | ICD-10-CM | POA: Diagnosis not present

## 2019-08-30 DIAGNOSIS — B182 Chronic viral hepatitis C: Secondary | ICD-10-CM | POA: Diagnosis not present

## 2019-08-31 DIAGNOSIS — B182 Chronic viral hepatitis C: Secondary | ICD-10-CM | POA: Diagnosis not present

## 2019-09-01 DIAGNOSIS — B182 Chronic viral hepatitis C: Secondary | ICD-10-CM | POA: Diagnosis not present

## 2019-09-02 DIAGNOSIS — B182 Chronic viral hepatitis C: Secondary | ICD-10-CM | POA: Diagnosis not present

## 2019-09-03 DIAGNOSIS — B182 Chronic viral hepatitis C: Secondary | ICD-10-CM | POA: Diagnosis not present

## 2019-09-04 DIAGNOSIS — B182 Chronic viral hepatitis C: Secondary | ICD-10-CM | POA: Diagnosis not present

## 2019-09-05 DIAGNOSIS — B182 Chronic viral hepatitis C: Secondary | ICD-10-CM | POA: Diagnosis not present

## 2019-09-06 DIAGNOSIS — B182 Chronic viral hepatitis C: Secondary | ICD-10-CM | POA: Diagnosis not present

## 2019-09-07 DIAGNOSIS — B182 Chronic viral hepatitis C: Secondary | ICD-10-CM | POA: Diagnosis not present

## 2019-09-08 DIAGNOSIS — B182 Chronic viral hepatitis C: Secondary | ICD-10-CM | POA: Diagnosis not present

## 2019-09-09 DIAGNOSIS — B182 Chronic viral hepatitis C: Secondary | ICD-10-CM | POA: Diagnosis not present

## 2019-09-10 DIAGNOSIS — B182 Chronic viral hepatitis C: Secondary | ICD-10-CM | POA: Diagnosis not present

## 2019-09-11 DIAGNOSIS — B182 Chronic viral hepatitis C: Secondary | ICD-10-CM | POA: Diagnosis not present

## 2019-09-12 DIAGNOSIS — B182 Chronic viral hepatitis C: Secondary | ICD-10-CM | POA: Diagnosis not present

## 2019-09-13 DIAGNOSIS — B182 Chronic viral hepatitis C: Secondary | ICD-10-CM | POA: Diagnosis not present

## 2019-09-14 DIAGNOSIS — B182 Chronic viral hepatitis C: Secondary | ICD-10-CM | POA: Diagnosis not present

## 2019-09-15 DIAGNOSIS — B182 Chronic viral hepatitis C: Secondary | ICD-10-CM | POA: Diagnosis not present

## 2019-09-16 DIAGNOSIS — B182 Chronic viral hepatitis C: Secondary | ICD-10-CM | POA: Diagnosis not present

## 2019-09-18 DIAGNOSIS — B182 Chronic viral hepatitis C: Secondary | ICD-10-CM | POA: Diagnosis not present

## 2019-09-19 DIAGNOSIS — B182 Chronic viral hepatitis C: Secondary | ICD-10-CM | POA: Diagnosis not present

## 2019-09-20 DIAGNOSIS — B182 Chronic viral hepatitis C: Secondary | ICD-10-CM | POA: Diagnosis not present

## 2019-09-21 DIAGNOSIS — B182 Chronic viral hepatitis C: Secondary | ICD-10-CM | POA: Diagnosis not present

## 2019-09-22 DIAGNOSIS — B182 Chronic viral hepatitis C: Secondary | ICD-10-CM | POA: Diagnosis not present

## 2019-09-23 DIAGNOSIS — B182 Chronic viral hepatitis C: Secondary | ICD-10-CM | POA: Diagnosis not present

## 2019-09-24 DIAGNOSIS — B182 Chronic viral hepatitis C: Secondary | ICD-10-CM | POA: Diagnosis not present

## 2019-09-25 DIAGNOSIS — B182 Chronic viral hepatitis C: Secondary | ICD-10-CM | POA: Diagnosis not present

## 2019-09-26 DIAGNOSIS — B182 Chronic viral hepatitis C: Secondary | ICD-10-CM | POA: Diagnosis not present

## 2019-09-27 DIAGNOSIS — B182 Chronic viral hepatitis C: Secondary | ICD-10-CM | POA: Diagnosis not present

## 2019-09-28 DIAGNOSIS — B182 Chronic viral hepatitis C: Secondary | ICD-10-CM | POA: Diagnosis not present

## 2019-09-29 DIAGNOSIS — B182 Chronic viral hepatitis C: Secondary | ICD-10-CM | POA: Diagnosis not present

## 2019-09-30 DIAGNOSIS — B182 Chronic viral hepatitis C: Secondary | ICD-10-CM | POA: Diagnosis not present

## 2019-10-01 DIAGNOSIS — B182 Chronic viral hepatitis C: Secondary | ICD-10-CM | POA: Diagnosis not present

## 2019-10-02 DIAGNOSIS — B182 Chronic viral hepatitis C: Secondary | ICD-10-CM | POA: Diagnosis not present

## 2019-10-03 DIAGNOSIS — B182 Chronic viral hepatitis C: Secondary | ICD-10-CM | POA: Diagnosis not present

## 2019-10-04 DIAGNOSIS — B182 Chronic viral hepatitis C: Secondary | ICD-10-CM | POA: Diagnosis not present

## 2019-10-05 DIAGNOSIS — B182 Chronic viral hepatitis C: Secondary | ICD-10-CM | POA: Diagnosis not present

## 2019-10-06 DIAGNOSIS — B182 Chronic viral hepatitis C: Secondary | ICD-10-CM | POA: Diagnosis not present

## 2019-10-07 DIAGNOSIS — B182 Chronic viral hepatitis C: Secondary | ICD-10-CM | POA: Diagnosis not present

## 2019-10-08 DIAGNOSIS — B182 Chronic viral hepatitis C: Secondary | ICD-10-CM | POA: Diagnosis not present

## 2019-10-09 ENCOUNTER — Encounter (HOSPITAL_COMMUNITY): Payer: Self-pay | Admitting: *Deleted

## 2019-10-09 ENCOUNTER — Emergency Department (HOSPITAL_COMMUNITY)
Admission: EM | Admit: 2019-10-09 | Discharge: 2019-10-09 | Disposition: A | Discharge status: Home / Self Care | Payer: Medicaid Other | Attending: Emergency Medicine | Admitting: Emergency Medicine

## 2019-10-09 ENCOUNTER — Emergency Department (HOSPITAL_COMMUNITY): Payer: Medicaid Other

## 2019-10-09 ENCOUNTER — Other Ambulatory Visit: Payer: Self-pay

## 2019-10-09 DIAGNOSIS — Z79899 Other long term (current) drug therapy: Secondary | ICD-10-CM | POA: Insufficient documentation

## 2019-10-09 DIAGNOSIS — I10 Essential (primary) hypertension: Secondary | ICD-10-CM | POA: Insufficient documentation

## 2019-10-09 DIAGNOSIS — R05 Cough: Secondary | ICD-10-CM | POA: Diagnosis present

## 2019-10-09 DIAGNOSIS — J449 Chronic obstructive pulmonary disease, unspecified: Secondary | ICD-10-CM | POA: Diagnosis not present

## 2019-10-09 DIAGNOSIS — F17200 Nicotine dependence, unspecified, uncomplicated: Secondary | ICD-10-CM | POA: Diagnosis not present

## 2019-10-09 DIAGNOSIS — F1110 Opioid abuse, uncomplicated: Secondary | ICD-10-CM | POA: Insufficient documentation

## 2019-10-09 DIAGNOSIS — R0602 Shortness of breath: Secondary | ICD-10-CM | POA: Diagnosis not present

## 2019-10-09 DIAGNOSIS — Z7982 Long term (current) use of aspirin: Secondary | ICD-10-CM | POA: Diagnosis not present

## 2019-10-09 DIAGNOSIS — J9811 Atelectasis: Secondary | ICD-10-CM | POA: Diagnosis not present

## 2019-10-09 DIAGNOSIS — R069 Unspecified abnormalities of breathing: Secondary | ICD-10-CM | POA: Diagnosis not present

## 2019-10-09 DIAGNOSIS — J90 Pleural effusion, not elsewhere classified: Secondary | ICD-10-CM | POA: Diagnosis not present

## 2019-10-09 DIAGNOSIS — U071 COVID-19: Secondary | ICD-10-CM | POA: Diagnosis not present

## 2019-10-09 DIAGNOSIS — B182 Chronic viral hepatitis C: Secondary | ICD-10-CM | POA: Diagnosis not present

## 2019-10-09 LAB — RESPIRATORY PANEL BY RT PCR (FLU A&B, COVID)
Influenza A by PCR: NEGATIVE
Influenza B by PCR: NEGATIVE
SARS Coronavirus 2 by RT PCR: POSITIVE — AB

## 2019-10-09 MED ORDER — CLONIDINE HCL 0.1 MG PO TABS: 0.1000 mg | ORAL_TABLET | Freq: Four times a day (QID) | ORAL | 11 refills | Status: DC
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
## 2019-10-09 NOTE — ED Notes (Signed)
CRITICAL VALUE ALERT  Critical Value:  Covid Positive  Date & Time Notied:  10/09/19 1608  Provider Notified: Dr. Deretha Emory  Orders Received/Actions taken: EDP notified

## 2019-10-09 NOTE — Discharge Instructions (Signed)
Covid test was positive.  Self isolate.  Would recommend multivitamin over-the-counter zinc supplement and vitamin D supplement.  Clonidine prescribed to help you with the heroin withdrawal.  Also information once you are over the Covid infection to help you with the substance abuse problem.  Return for any worse breathing problems feeling as if you are going to pass out.  Feeling is if you are very short of breath.

## 2019-10-09 NOTE — ED Triage Notes (Signed)
Pt brought in by [facility] from home with c/o cough, SOB, sore throat, nasal congestion that started this morning. Pt also asking for detox from heroin, last used at 0315 this morning. Pt was given 1 Albuterol nebulizer by EMS. Pt reports no relief from nebulizer.

## 2019-10-10 ENCOUNTER — Telehealth: Payer: Self-pay | Admitting: Infectious Diseases

## 2019-10-10 DIAGNOSIS — B182 Chronic viral hepatitis C: Secondary | ICD-10-CM | POA: Diagnosis not present

## 2019-10-10 NOTE — Telephone Encounter (Signed)
Thank you!  I have added her MRN to my daily e-mail.    Alice [name]

## 2019-10-10 NOTE — Telephone Encounter (Signed)
Called to Discuss with patient about Covid symptoms and the use of the monoclonal antibody infusion for those with mild to moderate Covid symptoms and at a high risk of hospitalization.     Pt appears to qualify for this infusion due to co-morbid conditions and/or a member of an at-risk group in accordance with the FDA Emergency Use Authorization.    Unable to reach pt  Was seen in the emergency room yesterday.  Symptom onset was 10/08/2007 28 based on chart review.  She has multiple social vulnerability risk criteria met that would leave her a candidate for infusion.  I tried to reach her on the only listed phone number however this is only to the [facility] detention center.  If she returns to the ER please kindly give her the [facility] long infusion number versus ER infusion.  Medon, MSN, NP-C [facility] [email or web address] Pager: [phone] Office: [phone] [place]: [phone]

## 2019-10-11 DIAGNOSIS — B182 Chronic viral hepatitis C: Secondary | ICD-10-CM | POA: Diagnosis not present

## 2019-10-12 DIAGNOSIS — B182 Chronic viral hepatitis C: Secondary | ICD-10-CM | POA: Diagnosis not present

## 2019-10-13 DIAGNOSIS — B182 Chronic viral hepatitis C: Secondary | ICD-10-CM | POA: Diagnosis not present

## 2019-10-14 DIAGNOSIS — B182 Chronic viral hepatitis C: Secondary | ICD-10-CM | POA: Diagnosis not present

## 2019-10-15 DIAGNOSIS — B182 Chronic viral hepatitis C: Secondary | ICD-10-CM | POA: Diagnosis not present

## 2019-10-16 DIAGNOSIS — B182 Chronic viral hepatitis C: Secondary | ICD-10-CM | POA: Diagnosis not present

## 2019-10-17 DIAGNOSIS — B182 Chronic viral hepatitis C: Secondary | ICD-10-CM | POA: Diagnosis not present

## 2019-10-18 DIAGNOSIS — B182 Chronic viral hepatitis C: Secondary | ICD-10-CM | POA: Diagnosis not present

## 2019-10-19 DIAGNOSIS — B182 Chronic viral hepatitis C: Secondary | ICD-10-CM | POA: Diagnosis not present

## 2019-10-20 DIAGNOSIS — B182 Chronic viral hepatitis C: Secondary | ICD-10-CM | POA: Diagnosis not present

## 2019-10-21 DIAGNOSIS — B182 Chronic viral hepatitis C: Secondary | ICD-10-CM | POA: Diagnosis not present

## 2019-10-22 DIAGNOSIS — B182 Chronic viral hepatitis C: Secondary | ICD-10-CM | POA: Diagnosis not present

## 2019-10-23 DIAGNOSIS — B182 Chronic viral hepatitis C: Secondary | ICD-10-CM | POA: Diagnosis not present

## 2019-10-24 DIAGNOSIS — B182 Chronic viral hepatitis C: Secondary | ICD-10-CM | POA: Diagnosis not present

## 2019-10-25 DIAGNOSIS — B182 Chronic viral hepatitis C: Secondary | ICD-10-CM | POA: Diagnosis not present

## 2019-10-26 DIAGNOSIS — B182 Chronic viral hepatitis C: Secondary | ICD-10-CM | POA: Diagnosis not present

## 2019-10-27 DIAGNOSIS — B182 Chronic viral hepatitis C: Secondary | ICD-10-CM | POA: Diagnosis not present

## 2019-10-28 DIAGNOSIS — B182 Chronic viral hepatitis C: Secondary | ICD-10-CM | POA: Diagnosis not present

## 2019-10-29 DIAGNOSIS — B182 Chronic viral hepatitis C: Secondary | ICD-10-CM | POA: Diagnosis not present

## 2019-10-30 DIAGNOSIS — B182 Chronic viral hepatitis C: Secondary | ICD-10-CM | POA: Diagnosis not present

## 2019-10-31 DIAGNOSIS — B182 Chronic viral hepatitis C: Secondary | ICD-10-CM | POA: Diagnosis not present

## 2019-11-01 DIAGNOSIS — B182 Chronic viral hepatitis C: Secondary | ICD-10-CM | POA: Diagnosis not present

## 2019-11-02 DIAGNOSIS — B182 Chronic viral hepatitis C: Secondary | ICD-10-CM | POA: Diagnosis not present

## 2019-11-03 DIAGNOSIS — B182 Chronic viral hepatitis C: Secondary | ICD-10-CM | POA: Diagnosis not present

## 2019-11-04 DIAGNOSIS — B182 Chronic viral hepatitis C: Secondary | ICD-10-CM | POA: Diagnosis not present

## 2019-11-05 DIAGNOSIS — B182 Chronic viral hepatitis C: Secondary | ICD-10-CM | POA: Diagnosis not present

## 2019-11-06 DIAGNOSIS — B182 Chronic viral hepatitis C: Secondary | ICD-10-CM | POA: Diagnosis not present

## 2019-11-07 DIAGNOSIS — B182 Chronic viral hepatitis C: Secondary | ICD-10-CM | POA: Diagnosis not present

## 2019-11-08 DIAGNOSIS — B182 Chronic viral hepatitis C: Secondary | ICD-10-CM | POA: Diagnosis not present

## 2019-11-09 DIAGNOSIS — B182 Chronic viral hepatitis C: Secondary | ICD-10-CM | POA: Diagnosis not present

## 2019-11-10 DIAGNOSIS — B182 Chronic viral hepatitis C: Secondary | ICD-10-CM | POA: Diagnosis not present

## 2019-11-11 DIAGNOSIS — B182 Chronic viral hepatitis C: Secondary | ICD-10-CM | POA: Diagnosis not present

## 2019-11-12 DIAGNOSIS — B182 Chronic viral hepatitis C: Secondary | ICD-10-CM | POA: Diagnosis not present

## 2019-11-13 DIAGNOSIS — B182 Chronic viral hepatitis C: Secondary | ICD-10-CM | POA: Diagnosis not present

## 2019-11-14 DIAGNOSIS — B182 Chronic viral hepatitis C: Secondary | ICD-10-CM | POA: Diagnosis not present

## 2019-11-15 DIAGNOSIS — B182 Chronic viral hepatitis C: Secondary | ICD-10-CM | POA: Diagnosis not present

## 2019-11-16 DIAGNOSIS — B182 Chronic viral hepatitis C: Secondary | ICD-10-CM | POA: Diagnosis not present

## 2019-11-17 DIAGNOSIS — B182 Chronic viral hepatitis C: Secondary | ICD-10-CM | POA: Diagnosis not present

## 2019-11-18 DIAGNOSIS — B182 Chronic viral hepatitis C: Secondary | ICD-10-CM | POA: Diagnosis not present

## 2019-11-19 DIAGNOSIS — B182 Chronic viral hepatitis C: Secondary | ICD-10-CM | POA: Diagnosis not present

## 2019-11-20 DIAGNOSIS — B182 Chronic viral hepatitis C: Secondary | ICD-10-CM | POA: Diagnosis not present

## 2019-11-21 DIAGNOSIS — B182 Chronic viral hepatitis C: Secondary | ICD-10-CM | POA: Diagnosis not present

## 2019-11-22 DIAGNOSIS — B182 Chronic viral hepatitis C: Secondary | ICD-10-CM | POA: Diagnosis not present

## 2019-11-23 DIAGNOSIS — B182 Chronic viral hepatitis C: Secondary | ICD-10-CM | POA: Diagnosis not present

## 2019-11-24 DIAGNOSIS — B182 Chronic viral hepatitis C: Secondary | ICD-10-CM | POA: Diagnosis not present

## 2019-11-25 DIAGNOSIS — B182 Chronic viral hepatitis C: Secondary | ICD-10-CM | POA: Diagnosis not present

## 2019-11-26 DIAGNOSIS — B182 Chronic viral hepatitis C: Secondary | ICD-10-CM | POA: Diagnosis not present

## 2019-11-27 DIAGNOSIS — B182 Chronic viral hepatitis C: Secondary | ICD-10-CM | POA: Diagnosis not present

## 2019-11-28 DIAGNOSIS — B182 Chronic viral hepatitis C: Secondary | ICD-10-CM | POA: Diagnosis not present

## 2019-11-29 DIAGNOSIS — B182 Chronic viral hepatitis C: Secondary | ICD-10-CM | POA: Diagnosis not present

## 2019-11-30 DIAGNOSIS — B182 Chronic viral hepatitis C: Secondary | ICD-10-CM | POA: Diagnosis not present

## 2019-12-01 DIAGNOSIS — B182 Chronic viral hepatitis C: Secondary | ICD-10-CM | POA: Diagnosis not present

## 2019-12-02 DIAGNOSIS — B182 Chronic viral hepatitis C: Secondary | ICD-10-CM | POA: Diagnosis not present

## 2019-12-03 DIAGNOSIS — B182 Chronic viral hepatitis C: Secondary | ICD-10-CM | POA: Diagnosis not present

## 2019-12-04 DIAGNOSIS — B182 Chronic viral hepatitis C: Secondary | ICD-10-CM | POA: Diagnosis not present

## 2019-12-05 DIAGNOSIS — B182 Chronic viral hepatitis C: Secondary | ICD-10-CM | POA: Diagnosis not present

## 2019-12-07 DIAGNOSIS — B182 Chronic viral hepatitis C: Secondary | ICD-10-CM | POA: Diagnosis not present

## 2019-12-08 DIAGNOSIS — B182 Chronic viral hepatitis C: Secondary | ICD-10-CM | POA: Diagnosis not present

## 2019-12-09 DIAGNOSIS — B182 Chronic viral hepatitis C: Secondary | ICD-10-CM | POA: Diagnosis not present

## 2019-12-10 DIAGNOSIS — B182 Chronic viral hepatitis C: Secondary | ICD-10-CM | POA: Diagnosis not present

## 2019-12-11 DIAGNOSIS — B182 Chronic viral hepatitis C: Secondary | ICD-10-CM | POA: Diagnosis not present

## 2019-12-12 DIAGNOSIS — B182 Chronic viral hepatitis C: Secondary | ICD-10-CM | POA: Diagnosis not present

## 2019-12-13 DIAGNOSIS — B182 Chronic viral hepatitis C: Secondary | ICD-10-CM | POA: Diagnosis not present

## 2019-12-14 DIAGNOSIS — B182 Chronic viral hepatitis C: Secondary | ICD-10-CM | POA: Diagnosis not present

## 2019-12-15 DIAGNOSIS — B182 Chronic viral hepatitis C: Secondary | ICD-10-CM | POA: Diagnosis not present

## 2019-12-16 DIAGNOSIS — B182 Chronic viral hepatitis C: Secondary | ICD-10-CM | POA: Diagnosis not present

## 2019-12-17 DIAGNOSIS — B182 Chronic viral hepatitis C: Secondary | ICD-10-CM | POA: Diagnosis not present

## 2019-12-18 DIAGNOSIS — B182 Chronic viral hepatitis C: Secondary | ICD-10-CM | POA: Diagnosis not present

## 2019-12-19 DIAGNOSIS — B182 Chronic viral hepatitis C: Secondary | ICD-10-CM | POA: Diagnosis not present

## 2019-12-20 DIAGNOSIS — B182 Chronic viral hepatitis C: Secondary | ICD-10-CM | POA: Diagnosis not present

## 2019-12-21 DIAGNOSIS — B182 Chronic viral hepatitis C: Secondary | ICD-10-CM | POA: Diagnosis not present

## 2019-12-22 DIAGNOSIS — B182 Chronic viral hepatitis C: Secondary | ICD-10-CM | POA: Diagnosis not present

## 2019-12-23 DIAGNOSIS — B182 Chronic viral hepatitis C: Secondary | ICD-10-CM | POA: Diagnosis not present

## 2019-12-24 DIAGNOSIS — B182 Chronic viral hepatitis C: Secondary | ICD-10-CM | POA: Diagnosis not present

## 2019-12-25 DIAGNOSIS — B182 Chronic viral hepatitis C: Secondary | ICD-10-CM | POA: Diagnosis not present

## 2019-12-26 DIAGNOSIS — B182 Chronic viral hepatitis C: Secondary | ICD-10-CM | POA: Diagnosis not present

## 2019-12-27 DIAGNOSIS — B182 Chronic viral hepatitis C: Secondary | ICD-10-CM | POA: Diagnosis not present

## 2019-12-28 DIAGNOSIS — B182 Chronic viral hepatitis C: Secondary | ICD-10-CM | POA: Diagnosis not present

## 2019-12-29 DIAGNOSIS — B182 Chronic viral hepatitis C: Secondary | ICD-10-CM | POA: Diagnosis not present

## 2019-12-30 DIAGNOSIS — B182 Chronic viral hepatitis C: Secondary | ICD-10-CM | POA: Diagnosis not present

## 2019-12-31 DIAGNOSIS — B182 Chronic viral hepatitis C: Secondary | ICD-10-CM | POA: Diagnosis not present

## 2020-01-01 DIAGNOSIS — B182 Chronic viral hepatitis C: Secondary | ICD-10-CM | POA: Diagnosis not present

## 2020-01-02 DIAGNOSIS — B182 Chronic viral hepatitis C: Secondary | ICD-10-CM | POA: Diagnosis not present

## 2020-01-03 DIAGNOSIS — B182 Chronic viral hepatitis C: Secondary | ICD-10-CM | POA: Diagnosis not present

## 2020-01-04 DIAGNOSIS — B182 Chronic viral hepatitis C: Secondary | ICD-10-CM | POA: Diagnosis not present

## 2020-01-06 DIAGNOSIS — B182 Chronic viral hepatitis C: Secondary | ICD-10-CM | POA: Diagnosis not present

## 2020-01-07 DIAGNOSIS — B182 Chronic viral hepatitis C: Secondary | ICD-10-CM | POA: Diagnosis not present

## 2020-01-08 DIAGNOSIS — B182 Chronic viral hepatitis C: Secondary | ICD-10-CM | POA: Diagnosis not present

## 2020-01-09 DIAGNOSIS — B182 Chronic viral hepatitis C: Secondary | ICD-10-CM | POA: Diagnosis not present

## 2020-01-10 DIAGNOSIS — B182 Chronic viral hepatitis C: Secondary | ICD-10-CM | POA: Diagnosis not present

## 2020-01-12 DIAGNOSIS — B182 Chronic viral hepatitis C: Secondary | ICD-10-CM | POA: Diagnosis not present

## 2020-01-13 DIAGNOSIS — B182 Chronic viral hepatitis C: Secondary | ICD-10-CM | POA: Diagnosis not present

## 2020-01-14 DIAGNOSIS — B182 Chronic viral hepatitis C: Secondary | ICD-10-CM | POA: Diagnosis not present

## 2020-01-15 DIAGNOSIS — B182 Chronic viral hepatitis C: Secondary | ICD-10-CM | POA: Diagnosis not present

## 2020-01-16 DIAGNOSIS — B182 Chronic viral hepatitis C: Secondary | ICD-10-CM | POA: Diagnosis not present

## 2020-01-17 DIAGNOSIS — B182 Chronic viral hepatitis C: Secondary | ICD-10-CM | POA: Diagnosis not present

## 2020-01-18 DIAGNOSIS — B182 Chronic viral hepatitis C: Secondary | ICD-10-CM | POA: Diagnosis not present

## 2020-01-19 DIAGNOSIS — B182 Chronic viral hepatitis C: Secondary | ICD-10-CM | POA: Diagnosis not present

## 2020-01-20 DIAGNOSIS — B182 Chronic viral hepatitis C: Secondary | ICD-10-CM | POA: Diagnosis not present

## 2020-01-21 DIAGNOSIS — B182 Chronic viral hepatitis C: Secondary | ICD-10-CM | POA: Diagnosis not present

## 2020-01-22 DIAGNOSIS — B182 Chronic viral hepatitis C: Secondary | ICD-10-CM | POA: Diagnosis not present

## 2020-01-23 DIAGNOSIS — B182 Chronic viral hepatitis C: Secondary | ICD-10-CM | POA: Diagnosis not present

## 2020-01-24 DIAGNOSIS — B182 Chronic viral hepatitis C: Secondary | ICD-10-CM | POA: Diagnosis not present

## 2020-01-25 DIAGNOSIS — B182 Chronic viral hepatitis C: Secondary | ICD-10-CM | POA: Diagnosis not present

## 2020-01-26 DIAGNOSIS — B182 Chronic viral hepatitis C: Secondary | ICD-10-CM | POA: Diagnosis not present

## 2020-01-27 DIAGNOSIS — B182 Chronic viral hepatitis C: Secondary | ICD-10-CM | POA: Diagnosis not present

## 2020-01-28 DIAGNOSIS — B182 Chronic viral hepatitis C: Secondary | ICD-10-CM | POA: Diagnosis not present

## 2020-01-29 DIAGNOSIS — B182 Chronic viral hepatitis C: Secondary | ICD-10-CM | POA: Diagnosis not present

## 2020-01-30 DIAGNOSIS — B182 Chronic viral hepatitis C: Secondary | ICD-10-CM | POA: Diagnosis not present

## 2020-01-31 DIAGNOSIS — B182 Chronic viral hepatitis C: Secondary | ICD-10-CM | POA: Diagnosis not present

## 2020-02-01 DIAGNOSIS — B182 Chronic viral hepatitis C: Secondary | ICD-10-CM | POA: Diagnosis not present

## 2020-02-02 DIAGNOSIS — B182 Chronic viral hepatitis C: Secondary | ICD-10-CM | POA: Diagnosis not present

## 2020-02-03 DIAGNOSIS — B182 Chronic viral hepatitis C: Secondary | ICD-10-CM | POA: Diagnosis not present

## 2020-02-04 DIAGNOSIS — B182 Chronic viral hepatitis C: Secondary | ICD-10-CM | POA: Diagnosis not present

## 2020-02-05 DIAGNOSIS — B182 Chronic viral hepatitis C: Secondary | ICD-10-CM | POA: Diagnosis not present

## 2020-02-06 DIAGNOSIS — B182 Chronic viral hepatitis C: Secondary | ICD-10-CM | POA: Diagnosis not present

## 2020-02-07 DIAGNOSIS — B182 Chronic viral hepatitis C: Secondary | ICD-10-CM | POA: Diagnosis not present

## 2020-02-08 DIAGNOSIS — B182 Chronic viral hepatitis C: Secondary | ICD-10-CM | POA: Diagnosis not present

## 2020-02-09 DIAGNOSIS — B182 Chronic viral hepatitis C: Secondary | ICD-10-CM | POA: Diagnosis not present

## 2020-02-10 DIAGNOSIS — B182 Chronic viral hepatitis C: Secondary | ICD-10-CM | POA: Diagnosis not present

## 2020-02-11 DIAGNOSIS — B182 Chronic viral hepatitis C: Secondary | ICD-10-CM | POA: Diagnosis not present

## 2020-02-12 DIAGNOSIS — B182 Chronic viral hepatitis C: Secondary | ICD-10-CM | POA: Diagnosis not present

## 2020-02-13 DIAGNOSIS — B182 Chronic viral hepatitis C: Secondary | ICD-10-CM | POA: Diagnosis not present

## 2020-02-14 DIAGNOSIS — B182 Chronic viral hepatitis C: Secondary | ICD-10-CM | POA: Diagnosis not present

## 2020-02-15 DIAGNOSIS — B182 Chronic viral hepatitis C: Secondary | ICD-10-CM | POA: Diagnosis not present

## 2020-02-16 DIAGNOSIS — B182 Chronic viral hepatitis C: Secondary | ICD-10-CM | POA: Diagnosis not present

## 2020-02-17 DIAGNOSIS — B182 Chronic viral hepatitis C: Secondary | ICD-10-CM | POA: Diagnosis not present

## 2020-02-18 DIAGNOSIS — B182 Chronic viral hepatitis C: Secondary | ICD-10-CM | POA: Diagnosis not present

## 2020-02-19 DIAGNOSIS — B182 Chronic viral hepatitis C: Secondary | ICD-10-CM | POA: Diagnosis not present

## 2020-02-20 DIAGNOSIS — B182 Chronic viral hepatitis C: Secondary | ICD-10-CM | POA: Diagnosis not present

## 2020-02-21 DIAGNOSIS — B182 Chronic viral hepatitis C: Secondary | ICD-10-CM | POA: Diagnosis not present

## 2020-02-22 DIAGNOSIS — B182 Chronic viral hepatitis C: Secondary | ICD-10-CM | POA: Diagnosis not present

## 2020-02-23 DIAGNOSIS — B182 Chronic viral hepatitis C: Secondary | ICD-10-CM | POA: Diagnosis not present

## 2020-02-24 DIAGNOSIS — B182 Chronic viral hepatitis C: Secondary | ICD-10-CM | POA: Diagnosis not present

## 2020-02-25 DIAGNOSIS — B182 Chronic viral hepatitis C: Secondary | ICD-10-CM | POA: Diagnosis not present

## 2020-02-26 DIAGNOSIS — B182 Chronic viral hepatitis C: Secondary | ICD-10-CM | POA: Diagnosis not present

## 2020-02-27 DIAGNOSIS — B182 Chronic viral hepatitis C: Secondary | ICD-10-CM | POA: Diagnosis not present

## 2020-02-28 DIAGNOSIS — B182 Chronic viral hepatitis C: Secondary | ICD-10-CM | POA: Diagnosis not present

## 2020-02-29 DIAGNOSIS — B182 Chronic viral hepatitis C: Secondary | ICD-10-CM | POA: Diagnosis not present

## 2020-03-01 DIAGNOSIS — B182 Chronic viral hepatitis C: Secondary | ICD-10-CM | POA: Diagnosis not present

## 2020-03-02 DIAGNOSIS — B182 Chronic viral hepatitis C: Secondary | ICD-10-CM | POA: Diagnosis not present

## 2020-03-03 DIAGNOSIS — B182 Chronic viral hepatitis C: Secondary | ICD-10-CM | POA: Diagnosis not present

## 2020-03-04 DIAGNOSIS — B182 Chronic viral hepatitis C: Secondary | ICD-10-CM | POA: Diagnosis not present

## 2020-03-05 DIAGNOSIS — B182 Chronic viral hepatitis C: Secondary | ICD-10-CM | POA: Diagnosis not present

## 2020-03-06 DIAGNOSIS — B182 Chronic viral hepatitis C: Secondary | ICD-10-CM | POA: Diagnosis not present

## 2020-03-07 DIAGNOSIS — B182 Chronic viral hepatitis C: Secondary | ICD-10-CM | POA: Diagnosis not present

## 2020-03-08 DIAGNOSIS — B182 Chronic viral hepatitis C: Secondary | ICD-10-CM | POA: Diagnosis not present

## 2020-03-09 DIAGNOSIS — B182 Chronic viral hepatitis C: Secondary | ICD-10-CM | POA: Diagnosis not present

## 2020-03-10 DIAGNOSIS — B182 Chronic viral hepatitis C: Secondary | ICD-10-CM | POA: Diagnosis not present

## 2020-03-11 DIAGNOSIS — B182 Chronic viral hepatitis C: Secondary | ICD-10-CM | POA: Diagnosis not present

## 2020-03-12 DIAGNOSIS — B182 Chronic viral hepatitis C: Secondary | ICD-10-CM | POA: Diagnosis not present

## 2020-03-14 DIAGNOSIS — B182 Chronic viral hepatitis C: Secondary | ICD-10-CM | POA: Diagnosis not present

## 2020-03-15 DIAGNOSIS — B182 Chronic viral hepatitis C: Secondary | ICD-10-CM | POA: Diagnosis not present

## 2020-03-16 DIAGNOSIS — B182 Chronic viral hepatitis C: Secondary | ICD-10-CM | POA: Diagnosis not present

## 2020-03-17 DIAGNOSIS — B182 Chronic viral hepatitis C: Secondary | ICD-10-CM | POA: Diagnosis not present

## 2020-03-18 DIAGNOSIS — B182 Chronic viral hepatitis C: Secondary | ICD-10-CM | POA: Diagnosis not present

## 2020-03-19 DIAGNOSIS — B182 Chronic viral hepatitis C: Secondary | ICD-10-CM | POA: Diagnosis not present

## 2020-03-20 DIAGNOSIS — B182 Chronic viral hepatitis C: Secondary | ICD-10-CM | POA: Diagnosis not present

## 2020-03-21 DIAGNOSIS — B182 Chronic viral hepatitis C: Secondary | ICD-10-CM | POA: Diagnosis not present

## 2020-03-22 DIAGNOSIS — B182 Chronic viral hepatitis C: Secondary | ICD-10-CM | POA: Diagnosis not present

## 2020-03-23 DIAGNOSIS — B182 Chronic viral hepatitis C: Secondary | ICD-10-CM | POA: Diagnosis not present

## 2020-03-24 DIAGNOSIS — B182 Chronic viral hepatitis C: Secondary | ICD-10-CM | POA: Diagnosis not present

## 2020-03-25 DIAGNOSIS — B182 Chronic viral hepatitis C: Secondary | ICD-10-CM | POA: Diagnosis not present

## 2020-03-26 DIAGNOSIS — B182 Chronic viral hepatitis C: Secondary | ICD-10-CM | POA: Diagnosis not present

## 2020-03-27 DIAGNOSIS — B182 Chronic viral hepatitis C: Secondary | ICD-10-CM | POA: Diagnosis not present

## 2020-03-28 DIAGNOSIS — B182 Chronic viral hepatitis C: Secondary | ICD-10-CM | POA: Diagnosis not present

## 2020-03-29 DIAGNOSIS — B182 Chronic viral hepatitis C: Secondary | ICD-10-CM | POA: Diagnosis not present

## 2020-03-30 DIAGNOSIS — B182 Chronic viral hepatitis C: Secondary | ICD-10-CM | POA: Diagnosis not present

## 2020-03-31 DIAGNOSIS — B182 Chronic viral hepatitis C: Secondary | ICD-10-CM | POA: Diagnosis not present

## 2020-04-01 DIAGNOSIS — B182 Chronic viral hepatitis C: Secondary | ICD-10-CM | POA: Diagnosis not present

## 2020-04-02 DIAGNOSIS — B182 Chronic viral hepatitis C: Secondary | ICD-10-CM | POA: Diagnosis not present

## 2020-04-03 DIAGNOSIS — B182 Chronic viral hepatitis C: Secondary | ICD-10-CM | POA: Diagnosis not present

## 2020-04-04 DIAGNOSIS — B182 Chronic viral hepatitis C: Secondary | ICD-10-CM | POA: Diagnosis not present

## 2020-04-05 DIAGNOSIS — B182 Chronic viral hepatitis C: Secondary | ICD-10-CM | POA: Diagnosis not present

## 2020-04-06 DIAGNOSIS — B182 Chronic viral hepatitis C: Secondary | ICD-10-CM | POA: Diagnosis not present

## 2020-04-07 DIAGNOSIS — B182 Chronic viral hepatitis C: Secondary | ICD-10-CM | POA: Diagnosis not present

## 2020-04-08 DIAGNOSIS — B182 Chronic viral hepatitis C: Secondary | ICD-10-CM | POA: Diagnosis not present

## 2020-04-09 DIAGNOSIS — B182 Chronic viral hepatitis C: Secondary | ICD-10-CM | POA: Diagnosis not present

## 2020-04-10 DIAGNOSIS — B182 Chronic viral hepatitis C: Secondary | ICD-10-CM | POA: Diagnosis not present

## 2020-04-11 DIAGNOSIS — B182 Chronic viral hepatitis C: Secondary | ICD-10-CM | POA: Diagnosis not present

## 2020-04-12 DIAGNOSIS — B182 Chronic viral hepatitis C: Secondary | ICD-10-CM | POA: Diagnosis not present

## 2020-04-13 DIAGNOSIS — B182 Chronic viral hepatitis C: Secondary | ICD-10-CM | POA: Diagnosis not present

## 2020-04-14 DIAGNOSIS — B182 Chronic viral hepatitis C: Secondary | ICD-10-CM | POA: Diagnosis not present

## 2020-04-15 DIAGNOSIS — B182 Chronic viral hepatitis C: Secondary | ICD-10-CM | POA: Diagnosis not present

## 2020-04-16 DIAGNOSIS — B182 Chronic viral hepatitis C: Secondary | ICD-10-CM | POA: Diagnosis not present

## 2020-04-17 DIAGNOSIS — B182 Chronic viral hepatitis C: Secondary | ICD-10-CM | POA: Diagnosis not present

## 2020-04-18 DIAGNOSIS — B182 Chronic viral hepatitis C: Secondary | ICD-10-CM | POA: Diagnosis not present

## 2020-04-19 DIAGNOSIS — B182 Chronic viral hepatitis C: Secondary | ICD-10-CM | POA: Diagnosis not present

## 2020-04-20 DIAGNOSIS — B182 Chronic viral hepatitis C: Secondary | ICD-10-CM | POA: Diagnosis not present

## 2020-04-21 DIAGNOSIS — B182 Chronic viral hepatitis C: Secondary | ICD-10-CM | POA: Diagnosis not present

## 2020-04-22 DIAGNOSIS — B182 Chronic viral hepatitis C: Secondary | ICD-10-CM | POA: Diagnosis not present

## 2020-04-23 DIAGNOSIS — B182 Chronic viral hepatitis C: Secondary | ICD-10-CM | POA: Diagnosis not present

## 2020-04-24 DIAGNOSIS — B182 Chronic viral hepatitis C: Secondary | ICD-10-CM | POA: Diagnosis not present

## 2020-04-25 DIAGNOSIS — B182 Chronic viral hepatitis C: Secondary | ICD-10-CM | POA: Diagnosis not present

## 2020-04-26 DIAGNOSIS — B182 Chronic viral hepatitis C: Secondary | ICD-10-CM | POA: Diagnosis not present

## 2020-04-27 DIAGNOSIS — B182 Chronic viral hepatitis C: Secondary | ICD-10-CM | POA: Diagnosis not present

## 2020-04-28 DIAGNOSIS — B182 Chronic viral hepatitis C: Secondary | ICD-10-CM | POA: Diagnosis not present

## 2020-04-29 DIAGNOSIS — B182 Chronic viral hepatitis C: Secondary | ICD-10-CM | POA: Diagnosis not present

## 2020-04-30 DIAGNOSIS — B182 Chronic viral hepatitis C: Secondary | ICD-10-CM | POA: Diagnosis not present

## 2020-05-01 DIAGNOSIS — B182 Chronic viral hepatitis C: Secondary | ICD-10-CM | POA: Diagnosis not present

## 2020-05-02 DIAGNOSIS — B182 Chronic viral hepatitis C: Secondary | ICD-10-CM | POA: Diagnosis not present

## 2020-05-03 DIAGNOSIS — B182 Chronic viral hepatitis C: Secondary | ICD-10-CM | POA: Diagnosis not present

## 2020-05-04 DIAGNOSIS — B182 Chronic viral hepatitis C: Secondary | ICD-10-CM | POA: Diagnosis not present

## 2020-05-22 DIAGNOSIS — I639 Cerebral infarction, unspecified: Secondary | ICD-10-CM | POA: Diagnosis not present

## 2020-05-23 ENCOUNTER — Other Ambulatory Visit: Payer: Self-pay

## 2020-05-23 ENCOUNTER — Emergency Department (HOSPITAL_COMMUNITY): Payer: Medicaid Other

## 2020-05-23 ENCOUNTER — Encounter (HOSPITAL_COMMUNITY): Payer: Self-pay

## 2020-05-23 ENCOUNTER — Emergency Department (HOSPITAL_COMMUNITY)
Admission: EM | Admit: 2020-05-23 | Discharge: 2020-05-24 | Disposition: A | Discharge status: Home / Self Care | Payer: Medicaid Other | Attending: Emergency Medicine | Admitting: Emergency Medicine

## 2020-05-23 DIAGNOSIS — R0602 Shortness of breath: Secondary | ICD-10-CM | POA: Diagnosis not present

## 2020-05-23 DIAGNOSIS — Z7951 Long term (current) use of inhaled steroids: Secondary | ICD-10-CM | POA: Diagnosis not present

## 2020-05-23 DIAGNOSIS — Z7982 Long term (current) use of aspirin: Secondary | ICD-10-CM | POA: Diagnosis not present

## 2020-05-23 DIAGNOSIS — F17200 Nicotine dependence, unspecified, uncomplicated: Secondary | ICD-10-CM | POA: Diagnosis not present

## 2020-05-23 DIAGNOSIS — I10 Essential (primary) hypertension: Secondary | ICD-10-CM | POA: Insufficient documentation

## 2020-05-23 DIAGNOSIS — Z79899 Other long term (current) drug therapy: Secondary | ICD-10-CM | POA: Insufficient documentation

## 2020-05-23 DIAGNOSIS — Z8616 Personal history of COVID-19: Secondary | ICD-10-CM | POA: Diagnosis not present

## 2020-05-23 DIAGNOSIS — R0600 Dyspnea, unspecified: Secondary | ICD-10-CM | POA: Diagnosis present

## 2020-05-23 DIAGNOSIS — I639 Cerebral infarction, unspecified: Secondary | ICD-10-CM | POA: Diagnosis not present

## 2020-05-23 DIAGNOSIS — R059 Cough, unspecified: Secondary | ICD-10-CM | POA: Diagnosis not present

## 2020-05-23 DIAGNOSIS — J441 Chronic obstructive pulmonary disease with (acute) exacerbation: Secondary | ICD-10-CM | POA: Diagnosis not present

## 2020-05-23 MED ORDER — ALBUTEROL SULFATE HFA 108 (90 BASE) MCG/ACT IN AERS
8.0000 | INHALATION_SPRAY | Freq: Once | RESPIRATORY_TRACT | Status: AC
  Administered 2020-05-24: 8 via RESPIRATORY_TRACT
  Filled 2020-05-23: qty 6.7

## 2020-05-23 MED ORDER — DEXAMETHASONE 4 MG PO TABS
10.0000 mg | ORAL_TABLET | Freq: Once | ORAL | Status: AC
  Administered 2020-05-24: 10 mg via ORAL
  Filled 2020-05-23: qty 3

## 2020-05-23 NOTE — Discharge Instructions (Signed)
Use your inhaler every 4 hours(6 puffs) while awake, return for sudden worsening shortness of breath, or if you need to use your inhaler more often.  ° °

## 2020-05-23 NOTE — ED Triage Notes (Signed)
Pt present to ED in police custody. Pt says she is having difficulty breathing. Pt has cough. Pt can speak full sentences. Pt has hx of COPD. Pt 98% on room air.

## 2020-05-24 DIAGNOSIS — I639 Cerebral infarction, unspecified: Secondary | ICD-10-CM | POA: Diagnosis not present
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
## 2020-05-25 DIAGNOSIS — I639 Cerebral infarction, unspecified: Secondary | ICD-10-CM | POA: Diagnosis not present

## 2020-05-26 DIAGNOSIS — I639 Cerebral infarction, unspecified: Secondary | ICD-10-CM | POA: Diagnosis not present

## 2020-05-27 DIAGNOSIS — I639 Cerebral infarction, unspecified: Secondary | ICD-10-CM | POA: Diagnosis not present

## 2020-05-28 DIAGNOSIS — I639 Cerebral infarction, unspecified: Secondary | ICD-10-CM | POA: Diagnosis not present

## 2020-05-29 ENCOUNTER — Emergency Department (HOSPITAL_COMMUNITY)
Admission: EM | Admit: 2020-05-29 | Discharge: 2020-05-30 | Disposition: A | Discharge status: Home / Self Care | Payer: Medicaid Other | Attending: Emergency Medicine | Admitting: Emergency Medicine

## 2020-05-29 ENCOUNTER — Emergency Department (HOSPITAL_COMMUNITY): Payer: Medicaid Other

## 2020-05-29 ENCOUNTER — Encounter (HOSPITAL_COMMUNITY): Payer: Self-pay | Admitting: *Deleted

## 2020-05-29 ENCOUNTER — Other Ambulatory Visit: Payer: Self-pay

## 2020-05-29 DIAGNOSIS — M4804 Spinal stenosis, thoracic region: Secondary | ICD-10-CM | POA: Diagnosis not present

## 2020-05-29 DIAGNOSIS — Z79899 Other long term (current) drug therapy: Secondary | ICD-10-CM | POA: Diagnosis not present

## 2020-05-29 DIAGNOSIS — J449 Chronic obstructive pulmonary disease, unspecified: Secondary | ICD-10-CM | POA: Diagnosis not present

## 2020-05-29 DIAGNOSIS — I639 Cerebral infarction, unspecified: Secondary | ICD-10-CM | POA: Diagnosis not present

## 2020-05-29 DIAGNOSIS — F17200 Nicotine dependence, unspecified, uncomplicated: Secondary | ICD-10-CM | POA: Insufficient documentation

## 2020-05-29 DIAGNOSIS — Z7982 Long term (current) use of aspirin: Secondary | ICD-10-CM | POA: Diagnosis not present

## 2020-05-29 DIAGNOSIS — M50221 Other cervical disc displacement at C4-C5 level: Secondary | ICD-10-CM | POA: Diagnosis not present

## 2020-05-29 DIAGNOSIS — M4802 Spinal stenosis, cervical region: Secondary | ICD-10-CM | POA: Diagnosis not present

## 2020-05-29 DIAGNOSIS — N3000 Acute cystitis without hematuria: Secondary | ICD-10-CM | POA: Diagnosis not present

## 2020-05-29 DIAGNOSIS — R55 Syncope and collapse: Secondary | ICD-10-CM | POA: Diagnosis not present

## 2020-05-29 DIAGNOSIS — S22070A Wedge compression fracture of T9-T10 vertebra, initial encounter for closed fracture: Secondary | ICD-10-CM | POA: Diagnosis not present

## 2020-05-29 DIAGNOSIS — M545 Low back pain, unspecified: Secondary | ICD-10-CM | POA: Diagnosis not present

## 2020-05-29 DIAGNOSIS — R531 Weakness: Secondary | ICD-10-CM | POA: Diagnosis not present

## 2020-05-29 DIAGNOSIS — S0990XA Unspecified injury of head, initial encounter: Secondary | ICD-10-CM | POA: Diagnosis not present

## 2020-05-29 DIAGNOSIS — R4182 Altered mental status, unspecified: Secondary | ICD-10-CM | POA: Diagnosis not present

## 2020-05-29 DIAGNOSIS — I10 Essential (primary) hypertension: Secondary | ICD-10-CM | POA: Diagnosis not present

## 2020-05-29 DIAGNOSIS — S22050A Wedge compression fracture of T5-T6 vertebra, initial encounter for closed fracture: Secondary | ICD-10-CM | POA: Diagnosis not present

## 2020-05-29 LAB — COMPREHENSIVE METABOLIC PANEL
ALT: 19 U/L (ref 0–44)
AST: 20 U/L (ref 15–41)
Albumin: 4.6 g/dL (ref 3.5–5.0)
Alkaline Phosphatase: 94 U/L (ref 38–126)
Anion gap: 12 (ref 5–15)
BUN: 22 mg/dL — ABNORMAL HIGH (ref 6–20)
CO2: 26 mmol/L (ref 22–32)
Calcium: 9.6 mg/dL (ref 8.9–10.3)
Chloride: 98 mmol/L (ref 98–111)
Creatinine, Ser: 1.01 mg/dL — ABNORMAL HIGH (ref 0.44–1.00)
GFR, Estimated: >60 mL/min (ref >60)
Glucose, Bld: 110 mg/dL — ABNORMAL HIGH (ref 70–99)
Potassium: 3.3 mmol/L — ABNORMAL LOW (ref 3.5–5.1)
Sodium: 136 mmol/L (ref 135–145)
Total Bilirubin: 1.4 mg/dL — ABNORMAL HIGH (ref 0.3–1.2)
Total Protein: 9.3 g/dL — ABNORMAL HIGH (ref 6.5–8.1)

## 2020-05-29 LAB — CBC WITH DIFFERENTIAL/PLATELET
Abs Immature Granulocytes: 0.04 10*3/uL (ref 0.00–0.07)
Basophils Absolute: 0.1 10*3/uL (ref 0.0–0.1)
Basophils Relative: 1 %
Eosinophils Absolute: 0 10*3/uL (ref 0.0–0.5)
Eosinophils Relative: 0 %
HCT: 51.1 % — ABNORMAL HIGH (ref 36.0–46.0)
Hemoglobin: 17.3 g/dL — ABNORMAL HIGH (ref 12.0–15.0)
Immature Granulocytes: 0 %
Lymphocytes Relative: 13 %
Lymphs Abs: 1.3 10*3/uL (ref 0.7–4.0)
MCH: 30.2 pg (ref 26.0–34.0)
MCHC: 33.9 g/dL (ref 30.0–36.0)
MCV: 89.2 fL (ref 80.0–100.0)
Monocytes Absolute: 0.8 10*3/uL (ref 0.1–1.0)
Monocytes Relative: 8 %
Neutro Abs: 7.6 10*3/uL (ref 1.7–7.7)
Neutrophils Relative %: 78 %
Platelets: 301 10*3/uL (ref 150–400)
RBC: 5.73 MIL/uL — ABNORMAL HIGH (ref 3.87–5.11)
RDW: 12.8 % (ref 11.5–15.5)
WBC: 9.8 10*3/uL (ref 4.0–10.5)
nRBC: 0 % (ref 0.0–0.2)

## 2020-05-29 LAB — URINALYSIS, ROUTINE W REFLEX MICROSCOPIC
Bilirubin Urine: NEGATIVE
Glucose, UA: NEGATIVE mg/dL
Ketones, ur: 20 mg/dL — AB
Nitrite: NEGATIVE
Protein, ur: NEGATIVE mg/dL
Specific Gravity, Urine: 1.019 (ref 1.005–1.030)
WBC, UA: >50 WBC/hpf — ABNORMAL HIGH (ref 0–5)
pH: 5 (ref 5.0–8.0)

## 2020-05-29 MED ORDER — CEPHALEXIN 500 MG PO CAPS: 500.0000 mg | ORAL_CAPSULE | Freq: Two times a day (BID) | ORAL | 0 refills | Status: AC

## 2020-05-29 MED ORDER — SODIUM CHLORIDE 0.9 % IV SOLN
1.0000 g | Freq: Once | INTRAVENOUS | Status: AC
  Administered 2020-05-29: 1 g via INTRAVENOUS
  Filled 2020-05-29: qty 10

## 2020-05-29 MED ORDER — LACTATED RINGERS IV BOLUS
1000.0000 mL | Freq: Once | INTRAVENOUS | Status: AC
  Administered 2020-05-29: 1000 mL via INTRAVENOUS

## 2020-05-29 NOTE — ED Triage Notes (Signed)
Pt with syncopal episode x 2 today. Reported pt is detoxing from cocaine, heroin and ETOH.  Pt states she last used all three was on 05/24/20.   Pt states she was standing when she passed out,, hitting her head on the concrete floor.
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
## 2020-05-29 NOTE — ED Notes (Signed)
Pt fell from wheelchair to the bed, [facility] supervisor and I assisted pt back up from the bed, no LOC.  Pt c/o tenderness to right side of head.  Pt had gotten up from recliner to wheelchair just prior to going to her room. CN informed of pt's fall.

## 2020-05-30 ENCOUNTER — Emergency Department (HOSPITAL_COMMUNITY): Payer: Medicaid Other

## 2020-05-30 DIAGNOSIS — I639 Cerebral infarction, unspecified: Secondary | ICD-10-CM | POA: Diagnosis not present

## 2020-05-30 DIAGNOSIS — S22050A Wedge compression fracture of T5-T6 vertebra, initial encounter for closed fracture: Secondary | ICD-10-CM | POA: Diagnosis not present

## 2020-05-30 DIAGNOSIS — R55 Syncope and collapse: Secondary | ICD-10-CM | POA: Diagnosis not present

## 2020-05-30 DIAGNOSIS — M50221 Other cervical disc displacement at C4-C5 level: Secondary | ICD-10-CM | POA: Diagnosis not present

## 2020-05-30 DIAGNOSIS — S22070A Wedge compression fracture of T9-T10 vertebra, initial encounter for closed fracture: Secondary | ICD-10-CM | POA: Diagnosis not present

## 2020-05-30 DIAGNOSIS — M545 Low back pain, unspecified: Secondary | ICD-10-CM | POA: Diagnosis not present

## 2020-05-30 DIAGNOSIS — M4802 Spinal stenosis, cervical region: Secondary | ICD-10-CM | POA: Diagnosis not present

## 2020-05-30 DIAGNOSIS — R531 Weakness: Secondary | ICD-10-CM | POA: Diagnosis not present

## 2020-05-30 DIAGNOSIS — M4804 Spinal stenosis, thoracic region: Secondary | ICD-10-CM | POA: Diagnosis not present

## 2020-05-30 LAB — SEDIMENTATION RATE: Sed Rate: 14 mm/hr (ref 0–22)

## 2020-05-30 LAB — C-REACTIVE PROTEIN: CRP: 0.6 mg/dL (ref <1.0)

## 2020-05-30 MED ORDER — LISINOPRIL 10 MG PO TABS
10.0000 mg | ORAL_TABLET | Freq: Every day | ORAL | Status: DC
  Administered 2020-05-30: 10 mg via ORAL
  Filled 2020-05-30: qty 1

## 2020-05-30 MED ORDER — SODIUM CHLORIDE 0.9 % IV BOLUS (SEPSIS)
1000.0000 mL | Freq: Once | INTRAVENOUS | Status: AC
  Administered 2020-05-30: 1000 mL via INTRAVENOUS

## 2020-05-30 MED ORDER — LORAZEPAM 2 MG/ML IJ SOLN
1.0000 mg | Freq: Once | INTRAMUSCULAR | Status: AC
  Administered 2020-05-30: 1 mg via INTRAVENOUS
  Filled 2020-05-30: qty 1

## 2020-05-30 MED ORDER — GADOBUTROL 1 MMOL/ML IV SOLN: 7.5000 mL | Freq: Once | INTRAVENOUS | Status: DC | PRN

## 2020-05-30 NOTE — ED Notes (Signed)
Patient transported to MRI 

## 2020-05-30 NOTE — ED Notes (Signed)
Pt ambulatory to restroom. Continues to not stand straight up and will stagger at times if not being held. However if nurses hand is out stretched toward her even if not holding pt she walks without complication.

## 2020-05-30 NOTE — ED Notes (Signed)
Pt ambulated per Dr. Bebe Shaggy. Pt refuses to stand up straight stating that she has pain in her lower back from falling multiple times today. According to officer pt has fallen face first landing on her arm each time today. Pt begins to walk quickly, shuffling and staggering as if she will fall if not being held by nurse. Pt held nurses fingers and was able to ambulate with no difficulty back to room.

## 2020-05-30 NOTE — ED Provider Notes (Signed)
I assumed care in signout.  Plan was to follow-up on patient has received IV fluids and IV antibiotics. On patient ambulates she will have intermittent staggering.  She reports some weakness in her legs.  She also appears to be slouched over and reports significant pain in her neck and back.  No incontinence is reported. Patient then tells me she has extensive history including stroke, endocarditis as well as previous "back infections" She reports previous history of lumbar surgery. While lying in bed, patient has no gross motor deficits. Patient has previous history of IV drug abuse but denies any recent IV drug abuse. I am unable to fully rule out an acute spinal infection at this time.  Patient is unreliable historian.  Plan for MRI with and without contrast of C/T/L-spine    Zadie Rhine, MD 05/30/20 [phone]

## 2020-05-30 NOTE — ED Notes (Signed)
Pt assisted to bathroom via wheelchair and back to bed with minimal assistance

## 2020-05-30 NOTE — ED Provider Notes (Signed)
Results for orders placed or performed during the hospital encounter of 05/29/20  Comprehensive metabolic panel  Result Value Ref Range   Sodium 136 135 - 145 mmol/L   Potassium 3.3 (L) 3.5 - 5.1 mmol/L   Chloride 98 98 - 111 mmol/L   CO2 26 22 - 32 mmol/L   Glucose, Bld 110 (H) 70 - 99 mg/dL   BUN 22 (H) 6 - 20 mg/dL   Creatinine, Ser 1.61 (H) 0.44 - 1.00 mg/dL   Calcium 9.6 8.9 - 09.6 mg/dL   Total Protein 9.3 (H) 6.5 - 8.1 g/dL   Albumin 4.6 3.5 - 5.0 g/dL   AST 20 15 - 41 U/L   ALT 19 0 - 44 U/L   Alkaline Phosphatase 94 38 - 126 U/L   Total Bilirubin 1.4 (H) 0.3 - 1.2 mg/dL   GFR, Estimated >04 >54 mL/min   Anion gap 12 5 - 15  CBC with Differential  Result Value Ref Range   WBC 9.8 4.0 - 10.5 K/uL   RBC 5.73 (H) 3.87 - 5.11 MIL/uL   Hemoglobin 17.3 (H) 12.0 - 15.0 g/dL   HCT 09.8 (H) 11.9 - 14.7 %   MCV 89.2 80.0 - 100.0 fL   MCH 30.2 26.0 - 34.0 pg   MCHC 33.9 30.0 - 36.0 g/dL   RDW 82.9 56.2 - 13.0 %   Platelets 301 150 - 400 K/uL   nRBC 0.0 0.0 - 0.2 %   Neutrophils Relative % 78 %   Neutro Abs 7.6 1.7 - 7.7 K/uL   Lymphocytes Relative 13 %   Lymphs Abs 1.3 0.7 - 4.0 K/uL   Monocytes Relative 8 %   Monocytes Absolute 0.8 0.1 - 1.0 K/uL   Eosinophils Relative 0 %   Eosinophils Absolute 0.0 0.0 - 0.5 K/uL   Basophils Relative 1 %   Basophils Absolute 0.1 0.0 - 0.1 K/uL   Immature Granulocytes 0 %   Abs Immature Granulocytes 0.04 0.00 - 0.07 K/uL  Urinalysis, Routine w reflex microscopic  Result Value Ref Range   Color, Urine YELLOW YELLOW   APPearance CLOUDY (A) CLEAR   Specific Gravity, Urine 1.019 1.005 - 1.030   pH 5.0 5.0 - 8.0   Glucose, UA NEGATIVE NEGATIVE mg/dL   Hgb urine dipstick SMALL (A) NEGATIVE   Bilirubin Urine NEGATIVE NEGATIVE   Ketones, ur 20 (A) NEGATIVE mg/dL   Protein, ur NEGATIVE NEGATIVE mg/dL   Nitrite NEGATIVE NEGATIVE   Leukocytes,Ua MODERATE (A) NEGATIVE   RBC / HPF 0-5 0 - 5 RBC/hpf   WBC, UA >50 (H) 0 - 5 WBC/hpf    Bacteria, UA RARE (A) NONE SEEN   Squamous Epithelial / LPF 6-10 0 - 5   WBC Clumps PRESENT    Mucus PRESENT   Sedimentation rate  Result Value Ref Range   Sed Rate 14 0 - 22 mm/hr  C-reactive protein  Result Value Ref Range   CRP 0.6 <1.0 mg/dL   CT Head Wo Contrast  Result Date: 05/29/2020 CLINICAL DATA:  Head trauma, abnormal mental status (Age 71-64y) EXAM: CT HEAD WITHOUT CONTRAST TECHNIQUE: Contiguous axial images were obtained from the base of the skull through the vertex without intravenous contrast. COMPARISON:  September 29, 2016. FINDINGS: Brain: No evidence of acute infarction, hemorrhage, hydrocephalus, extra-axial collection or mass lesion/mass effect. Vascular: No hyperdense vessel or unexpected calcification. Skull: Normal. Negative for fracture or focal lesion. Sinuses/Orbits: No acute finding. Other: None. IMPRESSION: No acute intracranial pathology. Electronically  Signed   By: Maudry Mayhew MD   On: 05/29/2020 22:32   MR LUMBAR SPINE WO CONTRAST  Result Date: 05/30/2020 CLINICAL DATA:  Low back pain. Infection suspected. Patient refused further imaging beyond the 2 motion degraded sagittal sequences obtained. EXAM: MRI LUMBAR SPINE WITHOUT CONTRAST TECHNIQUE: Multiplanar, multisequence MR imaging of the lumbar spine was performed. No intravenous contrast was administered. COMPARISON:  08/10/2010 FINDINGS: Segmentation: 5 lumbar type vertebral bodies as numbered previously. Alignment:  3-4 mm degenerative anterolisthesis L3-4. Vertebrae: Previous fusion and decompression from L4 to the sacrum. Since that time, patient has sustained partial compression fractures at L1 and L2. These do not seem to be recent based on the limited data of this exam. Conus medullaris and cauda equina: Limited detail because of motion. Paraspinal and other soft tissues: Limited detail because of motion and abbreviated study. No sign of paravertebral soft tissue edematous change. Disc levels: Limited  evaluation. Non-compressive disc bulges at T12-L1, L1-2 and L2-3. At L3-4, there is adjacent segment degenerative change with facet arthropathy and 3-4 mm of anterolisthesis. There is mild stenosis at this level. L4 to sacrum: Previous posterior decompression, diskectomy and fusion. Sufficient patency of the canal. No complicating feature identified. IMPRESSION: Motion degraded and abbreviated examination because of patient inability to continue. Limited data does not show evidence of infection or recent fracture in lumbar or upper sacral region. Apparently old partial compression fractures at L1 and L2. No significant finding related to the surgical region L4 to sacrum. There is a degree of multifactorial stenosis at the L3-4 level due to adjacent segment degenerative disease. Electronically Signed   By: Paulina Fusi M.D.   On: 05/30/2020 08:34   DG Chest Port 1 View  Result Date: 05/23/2020 CLINICAL DATA:  Cough and shortness of breath. EXAM: PORTABLE CHEST 1 VIEW COMPARISON:  10/09/2019 FINDINGS: Again seen low lung volumes. Chronic blunting vol of costophrenic angle scarring. No acute airspace disease. No pneumothorax or pleural effusion. Chronic bronchial thickening. No acute osseous abnormalities are seen. IMPRESSION: Low lung volumes with chronic bronchial thickening. No superimposed acute abnormality. Electronically Signed   By: Narda Rutherford M.D.   On: 05/23/2020 23:46    Several of the MRI certainly showed significant changes but no evidence of infection.  Patient's labs to no leukocytosis sed rate not elevated.  C-reactive protein not elevated.  Unlikely there is any signs of infection.  On the C-spine C4-C5 they feel is an exacerbation of chronic facet joint arthritis.  There is evidence of healed C6-C7 discitis osteomyelitis from 2017.  Breast 6 area without any distinct evidence of infection there is some evidence of inflammation.  Clinically overall based on those labs I feel that patient  can be discharged back with the discharge order set that was done by Dr. Renae Gloss.   Vanetta Mulders, MD 05/30/20 [phone]

## 2020-05-30 NOTE — ED Notes (Signed)
Pt back from MRI 

## 2020-05-30 NOTE — ED Notes (Signed)
Pt lying in bed, lights on, eyes closed, even respirations noted; officer at bedside

## 2020-05-30 NOTE — ED Notes (Addendum)
Assisted pt to BR via WC and back to bed.

## 2020-05-31 DIAGNOSIS — I639 Cerebral infarction, unspecified: Secondary | ICD-10-CM | POA: Diagnosis not present

## 2020-06-01 DIAGNOSIS — I639 Cerebral infarction, unspecified: Secondary | ICD-10-CM | POA: Diagnosis not present

## 2020-06-02 DIAGNOSIS — I639 Cerebral infarction, unspecified: Secondary | ICD-10-CM | POA: Diagnosis not present

## 2020-06-03 DIAGNOSIS — I639 Cerebral infarction, unspecified: Secondary | ICD-10-CM | POA: Diagnosis not present

## 2020-06-04 DIAGNOSIS — I639 Cerebral infarction, unspecified: Secondary | ICD-10-CM | POA: Diagnosis not present

## 2020-06-05 DIAGNOSIS — I639 Cerebral infarction, unspecified: Secondary | ICD-10-CM | POA: Diagnosis not present

## 2020-06-06 DIAGNOSIS — I639 Cerebral infarction, unspecified: Secondary | ICD-10-CM | POA: Diagnosis not present

## 2020-06-07 DIAGNOSIS — I10 Essential (primary) hypertension: Secondary | ICD-10-CM | POA: Diagnosis not present

## 2020-06-07 DIAGNOSIS — I639 Cerebral infarction, unspecified: Secondary | ICD-10-CM | POA: Diagnosis not present

## 2020-06-07 DIAGNOSIS — R000 Tachycardia, unspecified: Secondary | ICD-10-CM | POA: Diagnosis not present

## 2020-06-07 DIAGNOSIS — R569 Unspecified convulsions: Secondary | ICD-10-CM | POA: Diagnosis not present

## 2020-06-08 DIAGNOSIS — I639 Cerebral infarction, unspecified: Secondary | ICD-10-CM | POA: Diagnosis not present

## 2020-06-09 DIAGNOSIS — I639 Cerebral infarction, unspecified: Secondary | ICD-10-CM | POA: Diagnosis not present

## 2020-06-10 DIAGNOSIS — I639 Cerebral infarction, unspecified: Secondary | ICD-10-CM | POA: Diagnosis not present

## 2020-06-11 DIAGNOSIS — I639 Cerebral infarction, unspecified: Secondary | ICD-10-CM | POA: Diagnosis not present

## 2020-06-12 DIAGNOSIS — I639 Cerebral infarction, unspecified: Secondary | ICD-10-CM | POA: Diagnosis not present

## 2020-06-17 DIAGNOSIS — I639 Cerebral infarction, unspecified: Secondary | ICD-10-CM | POA: Diagnosis not present

## 2020-06-18 DIAGNOSIS — I639 Cerebral infarction, unspecified: Secondary | ICD-10-CM | POA: Diagnosis not present

## 2020-06-19 DIAGNOSIS — I639 Cerebral infarction, unspecified: Secondary | ICD-10-CM | POA: Diagnosis not present

## 2020-06-20 DIAGNOSIS — I639 Cerebral infarction, unspecified: Secondary | ICD-10-CM | POA: Diagnosis not present

## 2020-06-21 DIAGNOSIS — I639 Cerebral infarction, unspecified: Secondary | ICD-10-CM | POA: Diagnosis not present

## 2020-06-22 DIAGNOSIS — I639 Cerebral infarction, unspecified: Secondary | ICD-10-CM | POA: Diagnosis not present

## 2020-06-23 DIAGNOSIS — Z7689 Persons encountering health services in other specified circumstances: Secondary | ICD-10-CM | POA: Diagnosis not present

## 2020-06-23 DIAGNOSIS — I639 Cerebral infarction, unspecified: Secondary | ICD-10-CM | POA: Diagnosis not present

## 2020-06-24 DIAGNOSIS — I639 Cerebral infarction, unspecified: Secondary | ICD-10-CM | POA: Diagnosis not present

## 2020-06-25 DIAGNOSIS — I639 Cerebral infarction, unspecified: Secondary | ICD-10-CM | POA: Diagnosis not present

## 2020-06-26 DIAGNOSIS — I639 Cerebral infarction, unspecified: Secondary | ICD-10-CM | POA: Diagnosis not present

## 2020-06-27 DIAGNOSIS — I639 Cerebral infarction, unspecified: Secondary | ICD-10-CM | POA: Diagnosis not present

## 2020-06-28 DIAGNOSIS — I639 Cerebral infarction, unspecified: Secondary | ICD-10-CM | POA: Diagnosis not present

## 2020-06-29 DIAGNOSIS — I639 Cerebral infarction, unspecified: Secondary | ICD-10-CM | POA: Diagnosis not present

## 2020-06-30 DIAGNOSIS — I639 Cerebral infarction, unspecified: Secondary | ICD-10-CM | POA: Diagnosis not present

## 2020-07-01 DIAGNOSIS — I639 Cerebral infarction, unspecified: Secondary | ICD-10-CM | POA: Diagnosis not present

## 2020-07-01 DIAGNOSIS — Z7689 Persons encountering health services in other specified circumstances: Secondary | ICD-10-CM | POA: Diagnosis not present

## 2020-07-02 DIAGNOSIS — I639 Cerebral infarction, unspecified: Secondary | ICD-10-CM | POA: Diagnosis not present

## 2020-07-03 DIAGNOSIS — Z7689 Persons encountering health services in other specified circumstances: Secondary | ICD-10-CM | POA: Diagnosis not present

## 2020-07-03 DIAGNOSIS — I639 Cerebral infarction, unspecified: Secondary | ICD-10-CM | POA: Diagnosis not present

## 2020-07-04 DIAGNOSIS — I639 Cerebral infarction, unspecified: Secondary | ICD-10-CM | POA: Diagnosis not present

## 2020-07-05 DIAGNOSIS — I639 Cerebral infarction, unspecified: Secondary | ICD-10-CM | POA: Diagnosis not present

## 2020-07-06 DIAGNOSIS — I639 Cerebral infarction, unspecified: Secondary | ICD-10-CM | POA: Diagnosis not present

## 2020-07-07 DIAGNOSIS — I639 Cerebral infarction, unspecified: Secondary | ICD-10-CM | POA: Diagnosis not present

## 2020-07-08 DIAGNOSIS — I639 Cerebral infarction, unspecified: Secondary | ICD-10-CM | POA: Diagnosis not present

## 2020-07-09 DIAGNOSIS — I639 Cerebral infarction, unspecified: Secondary | ICD-10-CM | POA: Diagnosis not present

## 2020-07-10 DIAGNOSIS — I639 Cerebral infarction, unspecified: Secondary | ICD-10-CM | POA: Diagnosis not present

## 2020-07-11 DIAGNOSIS — I639 Cerebral infarction, unspecified: Secondary | ICD-10-CM | POA: Diagnosis not present

## 2020-07-12 DIAGNOSIS — I639 Cerebral infarction, unspecified: Secondary | ICD-10-CM | POA: Diagnosis not present

## 2020-07-13 DIAGNOSIS — I639 Cerebral infarction, unspecified: Secondary | ICD-10-CM | POA: Diagnosis not present

## 2020-07-15 DIAGNOSIS — I639 Cerebral infarction, unspecified: Secondary | ICD-10-CM | POA: Diagnosis not present

## 2020-07-15 DIAGNOSIS — Z7689 Persons encountering health services in other specified circumstances: Secondary | ICD-10-CM | POA: Diagnosis not present

## 2020-07-16 DIAGNOSIS — I639 Cerebral infarction, unspecified: Secondary | ICD-10-CM | POA: Diagnosis not present

## 2020-07-17 DIAGNOSIS — I639 Cerebral infarction, unspecified: Secondary | ICD-10-CM | POA: Diagnosis not present

## 2020-07-18 DIAGNOSIS — I639 Cerebral infarction, unspecified: Secondary | ICD-10-CM | POA: Diagnosis not present

## 2020-07-19 DIAGNOSIS — I639 Cerebral infarction, unspecified: Secondary | ICD-10-CM | POA: Diagnosis not present

## 2020-07-20 DIAGNOSIS — I639 Cerebral infarction, unspecified: Secondary | ICD-10-CM | POA: Diagnosis not present

## 2020-07-21 DIAGNOSIS — I639 Cerebral infarction, unspecified: Secondary | ICD-10-CM | POA: Diagnosis not present

## 2020-07-21 DIAGNOSIS — Z7689 Persons encountering health services in other specified circumstances: Secondary | ICD-10-CM | POA: Diagnosis not present

## 2020-07-22 DIAGNOSIS — I639 Cerebral infarction, unspecified: Secondary | ICD-10-CM | POA: Diagnosis not present

## 2020-07-23 DIAGNOSIS — I639 Cerebral infarction, unspecified: Secondary | ICD-10-CM | POA: Diagnosis not present

## 2020-07-24 DIAGNOSIS — I639 Cerebral infarction, unspecified: Secondary | ICD-10-CM | POA: Diagnosis not present

## 2020-07-25 DIAGNOSIS — M797 Fibromyalgia: Secondary | ICD-10-CM | POA: Diagnosis not present

## 2020-07-26 DIAGNOSIS — M797 Fibromyalgia: Secondary | ICD-10-CM | POA: Diagnosis not present

## 2020-07-27 DIAGNOSIS — M797 Fibromyalgia: Secondary | ICD-10-CM | POA: Diagnosis not present

## 2020-07-28 DIAGNOSIS — M797 Fibromyalgia: Secondary | ICD-10-CM | POA: Diagnosis not present

## 2020-07-28 DIAGNOSIS — Z7689 Persons encountering health services in other specified circumstances: Secondary | ICD-10-CM | POA: Diagnosis not present

## 2020-07-29 DIAGNOSIS — M797 Fibromyalgia: Secondary | ICD-10-CM | POA: Diagnosis not present

## 2020-07-30 DIAGNOSIS — M797 Fibromyalgia: Secondary | ICD-10-CM | POA: Diagnosis not present

## 2020-07-31 DIAGNOSIS — M797 Fibromyalgia: Secondary | ICD-10-CM | POA: Diagnosis not present

## 2020-07-31 DIAGNOSIS — Z7689 Persons encountering health services in other specified circumstances: Secondary | ICD-10-CM | POA: Diagnosis not present

## 2020-08-01 DIAGNOSIS — M797 Fibromyalgia: Secondary | ICD-10-CM | POA: Diagnosis not present

## 2020-08-02 DIAGNOSIS — M797 Fibromyalgia: Secondary | ICD-10-CM | POA: Diagnosis not present

## 2020-08-03 DIAGNOSIS — M797 Fibromyalgia: Secondary | ICD-10-CM | POA: Diagnosis not present

## 2020-08-04 DIAGNOSIS — M797 Fibromyalgia: Secondary | ICD-10-CM | POA: Diagnosis not present

## 2020-08-05 DIAGNOSIS — M797 Fibromyalgia: Secondary | ICD-10-CM | POA: Diagnosis not present

## 2020-08-05 DIAGNOSIS — Z7689 Persons encountering health services in other specified circumstances: Secondary | ICD-10-CM | POA: Diagnosis not present

## 2020-08-06 DIAGNOSIS — M797 Fibromyalgia: Secondary | ICD-10-CM | POA: Diagnosis not present

## 2020-08-07 DIAGNOSIS — M797 Fibromyalgia: Secondary | ICD-10-CM | POA: Diagnosis not present

## 2020-08-07 DIAGNOSIS — Z7689 Persons encountering health services in other specified circumstances: Secondary | ICD-10-CM | POA: Diagnosis not present

## 2020-08-08 DIAGNOSIS — M797 Fibromyalgia: Secondary | ICD-10-CM | POA: Diagnosis not present

## 2020-08-09 DIAGNOSIS — M797 Fibromyalgia: Secondary | ICD-10-CM | POA: Diagnosis not present

## 2020-08-10 DIAGNOSIS — M797 Fibromyalgia: Secondary | ICD-10-CM | POA: Diagnosis not present

## 2020-08-11 DIAGNOSIS — M797 Fibromyalgia: Secondary | ICD-10-CM | POA: Diagnosis not present

## 2020-08-12 DIAGNOSIS — J441 Chronic obstructive pulmonary disease with (acute) exacerbation: Secondary | ICD-10-CM | POA: Diagnosis not present

## 2020-08-12 DIAGNOSIS — R059 Cough, unspecified: Secondary | ICD-10-CM | POA: Diagnosis not present

## 2020-08-12 DIAGNOSIS — M797 Fibromyalgia: Secondary | ICD-10-CM | POA: Diagnosis not present

## 2020-08-12 DIAGNOSIS — R0981 Nasal congestion: Secondary | ICD-10-CM | POA: Diagnosis not present

## 2020-08-13 DIAGNOSIS — M797 Fibromyalgia: Secondary | ICD-10-CM | POA: Diagnosis not present

## 2020-08-14 DIAGNOSIS — M797 Fibromyalgia: Secondary | ICD-10-CM | POA: Diagnosis not present

## 2020-08-15 DIAGNOSIS — M797 Fibromyalgia: Secondary | ICD-10-CM | POA: Diagnosis not present

## 2020-08-16 DIAGNOSIS — M797 Fibromyalgia: Secondary | ICD-10-CM | POA: Diagnosis not present

## 2020-08-17 DIAGNOSIS — M797 Fibromyalgia: Secondary | ICD-10-CM | POA: Diagnosis not present

## 2020-08-18 DIAGNOSIS — Z7689 Persons encountering health services in other specified circumstances: Secondary | ICD-10-CM | POA: Diagnosis not present

## 2020-08-18 DIAGNOSIS — M797 Fibromyalgia: Secondary | ICD-10-CM | POA: Diagnosis not present

## 2020-08-19 DIAGNOSIS — M797 Fibromyalgia: Secondary | ICD-10-CM | POA: Diagnosis not present

## 2020-08-20 DIAGNOSIS — M797 Fibromyalgia: Secondary | ICD-10-CM | POA: Diagnosis not present

## 2020-08-21 DIAGNOSIS — M797 Fibromyalgia: Secondary | ICD-10-CM | POA: Diagnosis not present

## 2020-08-22 DIAGNOSIS — M797 Fibromyalgia: Secondary | ICD-10-CM | POA: Diagnosis not present

## 2020-08-23 DIAGNOSIS — M797 Fibromyalgia: Secondary | ICD-10-CM | POA: Diagnosis not present

## 2020-08-24 DIAGNOSIS — M797 Fibromyalgia: Secondary | ICD-10-CM | POA: Diagnosis not present

## 2020-08-25 DIAGNOSIS — M797 Fibromyalgia: Secondary | ICD-10-CM | POA: Diagnosis not present

## 2020-08-26 DIAGNOSIS — Z7689 Persons encountering health services in other specified circumstances: Secondary | ICD-10-CM | POA: Diagnosis not present

## 2020-08-26 DIAGNOSIS — M797 Fibromyalgia: Secondary | ICD-10-CM | POA: Diagnosis not present

## 2020-08-27 DIAGNOSIS — M797 Fibromyalgia: Secondary | ICD-10-CM | POA: Diagnosis not present

## 2020-08-28 DIAGNOSIS — Z7689 Persons encountering health services in other specified circumstances: Secondary | ICD-10-CM | POA: Diagnosis not present

## 2020-08-28 DIAGNOSIS — M797 Fibromyalgia: Secondary | ICD-10-CM | POA: Diagnosis not present

## 2020-08-29 DIAGNOSIS — M797 Fibromyalgia: Secondary | ICD-10-CM | POA: Diagnosis not present

## 2020-08-30 DIAGNOSIS — M797 Fibromyalgia: Secondary | ICD-10-CM | POA: Diagnosis not present

## 2020-08-31 DIAGNOSIS — M797 Fibromyalgia: Secondary | ICD-10-CM | POA: Diagnosis not present

## 2020-09-01 DIAGNOSIS — M797 Fibromyalgia: Secondary | ICD-10-CM | POA: Diagnosis not present

## 2020-09-02 DIAGNOSIS — M797 Fibromyalgia: Secondary | ICD-10-CM | POA: Diagnosis not present

## 2020-09-03 DIAGNOSIS — M797 Fibromyalgia: Secondary | ICD-10-CM | POA: Diagnosis not present

## 2020-09-04 DIAGNOSIS — Z7689 Persons encountering health services in other specified circumstances: Secondary | ICD-10-CM | POA: Diagnosis not present

## 2020-09-04 DIAGNOSIS — M797 Fibromyalgia: Secondary | ICD-10-CM | POA: Diagnosis not present

## 2020-09-05 DIAGNOSIS — M797 Fibromyalgia: Secondary | ICD-10-CM | POA: Diagnosis not present

## 2020-09-06 DIAGNOSIS — M797 Fibromyalgia: Secondary | ICD-10-CM | POA: Diagnosis not present

## 2020-09-07 DIAGNOSIS — M797 Fibromyalgia: Secondary | ICD-10-CM | POA: Diagnosis not present

## 2020-09-08 DIAGNOSIS — Z7689 Persons encountering health services in other specified circumstances: Secondary | ICD-10-CM | POA: Diagnosis not present

## 2020-09-08 DIAGNOSIS — M797 Fibromyalgia: Secondary | ICD-10-CM | POA: Diagnosis not present

## 2020-09-09 DIAGNOSIS — M797 Fibromyalgia: Secondary | ICD-10-CM | POA: Diagnosis not present

## 2020-09-10 DIAGNOSIS — Z7689 Persons encountering health services in other specified circumstances: Secondary | ICD-10-CM | POA: Diagnosis not present

## 2020-09-10 DIAGNOSIS — M797 Fibromyalgia: Secondary | ICD-10-CM | POA: Diagnosis not present

## 2020-09-11 DIAGNOSIS — Z8673 Personal history of transient ischemic attack (TIA), and cerebral infarction without residual deficits: Secondary | ICD-10-CM | POA: Diagnosis not present

## 2020-09-11 DIAGNOSIS — M797 Fibromyalgia: Secondary | ICD-10-CM | POA: Diagnosis not present

## 2020-09-12 DIAGNOSIS — Z8673 Personal history of transient ischemic attack (TIA), and cerebral infarction without residual deficits: Secondary | ICD-10-CM | POA: Diagnosis not present

## 2020-09-13 DIAGNOSIS — Z8673 Personal history of transient ischemic attack (TIA), and cerebral infarction without residual deficits: Secondary | ICD-10-CM | POA: Diagnosis not present

## 2020-09-14 DIAGNOSIS — Z8673 Personal history of transient ischemic attack (TIA), and cerebral infarction without residual deficits: Secondary | ICD-10-CM | POA: Diagnosis not present

## 2020-09-15 DIAGNOSIS — Z8673 Personal history of transient ischemic attack (TIA), and cerebral infarction without residual deficits: Secondary | ICD-10-CM | POA: Diagnosis not present

## 2020-09-16 DIAGNOSIS — Z8673 Personal history of transient ischemic attack (TIA), and cerebral infarction without residual deficits: Secondary | ICD-10-CM | POA: Diagnosis not present

## 2020-09-17 DIAGNOSIS — Z8673 Personal history of transient ischemic attack (TIA), and cerebral infarction without residual deficits: Secondary | ICD-10-CM | POA: Diagnosis not present

## 2020-09-18 DIAGNOSIS — Z8673 Personal history of transient ischemic attack (TIA), and cerebral infarction without residual deficits: Secondary | ICD-10-CM | POA: Diagnosis not present

## 2020-09-19 DIAGNOSIS — Z8673 Personal history of transient ischemic attack (TIA), and cerebral infarction without residual deficits: Secondary | ICD-10-CM | POA: Diagnosis not present

## 2020-09-20 DIAGNOSIS — Z8673 Personal history of transient ischemic attack (TIA), and cerebral infarction without residual deficits: Secondary | ICD-10-CM | POA: Diagnosis not present

## 2020-09-21 DIAGNOSIS — Z8673 Personal history of transient ischemic attack (TIA), and cerebral infarction without residual deficits: Secondary | ICD-10-CM | POA: Diagnosis not present

## 2020-09-22 DIAGNOSIS — Z8673 Personal history of transient ischemic attack (TIA), and cerebral infarction without residual deficits: Secondary | ICD-10-CM | POA: Diagnosis not present

## 2020-09-23 DIAGNOSIS — Z8673 Personal history of transient ischemic attack (TIA), and cerebral infarction without residual deficits: Secondary | ICD-10-CM | POA: Diagnosis not present

## 2020-09-24 DIAGNOSIS — Z8673 Personal history of transient ischemic attack (TIA), and cerebral infarction without residual deficits: Secondary | ICD-10-CM | POA: Diagnosis not present

## 2020-09-25 DIAGNOSIS — Z8673 Personal history of transient ischemic attack (TIA), and cerebral infarction without residual deficits: Secondary | ICD-10-CM | POA: Diagnosis not present

## 2020-09-26 DIAGNOSIS — Z8673 Personal history of transient ischemic attack (TIA), and cerebral infarction without residual deficits: Secondary | ICD-10-CM | POA: Diagnosis not present

## 2020-09-27 DIAGNOSIS — Z8673 Personal history of transient ischemic attack (TIA), and cerebral infarction without residual deficits: Secondary | ICD-10-CM | POA: Diagnosis not present

## 2020-09-28 DIAGNOSIS — Z8673 Personal history of transient ischemic attack (TIA), and cerebral infarction without residual deficits: Secondary | ICD-10-CM | POA: Diagnosis not present

## 2020-09-29 DIAGNOSIS — Z8673 Personal history of transient ischemic attack (TIA), and cerebral infarction without residual deficits: Secondary | ICD-10-CM | POA: Diagnosis not present

## 2020-09-30 DIAGNOSIS — Z8673 Personal history of transient ischemic attack (TIA), and cerebral infarction without residual deficits: Secondary | ICD-10-CM | POA: Diagnosis not present

## 2020-10-01 DIAGNOSIS — Z8673 Personal history of transient ischemic attack (TIA), and cerebral infarction without residual deficits: Secondary | ICD-10-CM | POA: Diagnosis not present

## 2020-10-02 DIAGNOSIS — Z8673 Personal history of transient ischemic attack (TIA), and cerebral infarction without residual deficits: Secondary | ICD-10-CM | POA: Diagnosis not present

## 2020-10-03 DIAGNOSIS — Z8673 Personal history of transient ischemic attack (TIA), and cerebral infarction without residual deficits: Secondary | ICD-10-CM | POA: Diagnosis not present

## 2020-10-04 DIAGNOSIS — Z8673 Personal history of transient ischemic attack (TIA), and cerebral infarction without residual deficits: Secondary | ICD-10-CM | POA: Diagnosis not present

## 2020-10-05 DIAGNOSIS — Z8673 Personal history of transient ischemic attack (TIA), and cerebral infarction without residual deficits: Secondary | ICD-10-CM | POA: Diagnosis not present

## 2020-10-06 DIAGNOSIS — Z8673 Personal history of transient ischemic attack (TIA), and cerebral infarction without residual deficits: Secondary | ICD-10-CM | POA: Diagnosis not present

## 2020-10-07 DIAGNOSIS — Z8673 Personal history of transient ischemic attack (TIA), and cerebral infarction without residual deficits: Secondary | ICD-10-CM | POA: Diagnosis not present

## 2020-10-08 DIAGNOSIS — Z8673 Personal history of transient ischemic attack (TIA), and cerebral infarction without residual deficits: Secondary | ICD-10-CM | POA: Diagnosis not present

## 2020-10-09 DIAGNOSIS — Z8673 Personal history of transient ischemic attack (TIA), and cerebral infarction without residual deficits: Secondary | ICD-10-CM | POA: Diagnosis not present

## 2020-10-10 DIAGNOSIS — Z8673 Personal history of transient ischemic attack (TIA), and cerebral infarction without residual deficits: Secondary | ICD-10-CM | POA: Diagnosis not present

## 2020-10-15 ENCOUNTER — Ambulatory Visit: Payer: Medicaid Other | Admitting: Nurse Practitioner

## 2020-10-22 ENCOUNTER — Ambulatory Visit (INDEPENDENT_AMBULATORY_CARE_PROVIDER_SITE_OTHER): Payer: Medicaid Other | Admitting: Nurse Practitioner

## 2020-10-22 ENCOUNTER — Other Ambulatory Visit: Payer: Self-pay

## 2020-10-22 ENCOUNTER — Encounter: Payer: Self-pay | Admitting: Nurse Practitioner

## 2020-10-22 VITALS — BP 179/112 | HR 88 | Temp 98.5°F | Ht 70.0 in | Wt 182.0 lb

## 2020-10-22 DIAGNOSIS — I10 Essential (primary) hypertension: Secondary | ICD-10-CM

## 2020-10-22 DIAGNOSIS — F419 Anxiety disorder, unspecified: Secondary | ICD-10-CM | POA: Diagnosis not present

## 2020-10-22 DIAGNOSIS — F321 Major depressive disorder, single episode, moderate: Secondary | ICD-10-CM

## 2020-10-22 MED ORDER — LISINOPRIL 5 MG PO TABS: 5.0000 mg | ORAL_TABLET | Freq: Every day | ORAL | 3 refills | Status: DC
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
## 2020-10-22 NOTE — Patient Instructions (Signed)
Managing Depression, Adult Depression is a mental health condition that affects your thoughts, feelings, and actions. Being diagnosed with depression can bring you relief if you did not know why you have felt or behaved a certain way. It could also leave you feeling overwhelmed with uncertainty about your future. Preparing yourself to manage your symptoms can help you feel more positive about your future. How to manage lifestyle changes Managing stress Stress is your body's reaction to life changes and events, both good and bad. Stress can add to your feelings of depression. Learning to manage your stress can help lessen your feelings of depression. Try some of the following approaches to reducing your stress (stress reduction techniques): Listen to music that you enjoy and that inspires you. Try using a meditation app or take a meditation class. Develop a practice that helps you connect with your spiritual self. Walk in nature, pray, or go to a place of worship. Do some deep breathing. To do this, inhale slowly through your nose. Pause at the top of your inhale for a few seconds and then exhale slowly, letting your muscles relax. Practice yoga to help relax and work your muscles. Choose a stress reduction technique that suits your lifestyle and personality. These techniques take time and practice to develop. Set aside 5-15 minutes a day to do them. Therapists can offer training in these techniques. Other things you can do to manage stress include: Keeping a stress diary. Knowing your limits and saying no when you think something is too much. Paying attention to how you react to certain situations. You may not be able to control everything, but you can change your reaction. Adding humor to your life by watching funny films or TV shows. Making time for activities that you enjoy and that relax you.  Medicines Medicines, such as antidepressants, are often a part of treatment for depression. Talk  with your pharmacist or health care provider about all the medicines, supplements, and herbal products that you take, their possible side effects, and what medicines and other products are safe to take together. Make sure to report any side effects you may have to your health care provider. Relationships Your health care provider may suggest family therapy, couples therapy, or individual therapy as part of your treatment. How to recognize changes Everyone responds differently to treatment for depression. As you recover from depression, you may start to: Have more interest in doing activities. Feel less hopeless. Have more energy. Overeat less often, or have a better appetite. Have better mental focus. It is important to recognize if your depression is not getting better or is getting worse. The symptoms you had in the beginning may return, such as: Tiredness (fatigue) or low energy. Eating too much or too little. Sleeping too much or too little. Feeling restless, agitated, or hopeless. Trouble focusing or making decisions. Unexplained physical complaints. Feeling irritable, angry, or aggressive. If you or your family members notice these symptoms coming back, let your health care provider know right away. Follow these instructions at home: Activity  Try to get some form of exercise each day, such as walking, biking, swimming, or lifting weights. Practice stress reduction techniques. Engage your mind by taking a class or doing some volunteer work. Lifestyle Get the right amount and quality of sleep. Cut down on using caffeine, tobacco, alcohol, and other potentially harmful substances. Eat a healthy diet that includes plenty of vegetables, fruits, whole grains, low-fat dairy products, and lean protein. Do not eat a lot   of foods that are high in solid fats, added sugars, or salt (sodium). General instructions Take over-the-counter and prescription medicines only as told by your health  care provider. Keep all follow-up visits as told by your health care provider. This is important. Where to find support Talking to others Friends and family members can be sources of support and guidance. Talk to trusted friends or family members about your condition. Explain your symptoms to them, and let them know that you are working with a health care provider to treat your depression. Tell friends and family members how they also can be helpful. Finances Find appropriate mental health providers that fit with your financial situation. Talk with your health care provider about options to get reduced prices on your medicines. Where to find more information You can find support in your area from: Anxiety and Depression Association of America (ADAA): www.adaa.org Mental Health America: www.mentalhealthamerica.net [employer] on Mental Illness: www.nami.org Contact a health care provider if: You stop taking your antidepressant medicines, and you have any of these symptoms: Nausea. Headache. Light-headedness. Chills and body aches. Not being able to sleep (insomnia). You or your friends and family think your depression is getting worse. Get help right away if: You have thoughts of hurting yourself or others. If you ever feel like you may hurt yourself or others, or have thoughts about taking your own life, get help right away. Go to your nearest emergency department or: Call your local emergency services (911 in the [place] Call a suicide crisis helpline, such as the [facility] at 640-843-2829. This is open 24 hours a day in the [place] Text the Crisis Text Line at (308)477-5819 (in the [place].). Summary If you are diagnosed with depression, preparing yourself to manage your symptoms is a good way to feel positive about your future. Work with your health care provider on a management plan that includes stress reduction techniques, medicines (if applicable), therapy, and  healthy lifestyle habits. Keep talking with your health care provider about how your treatment is working. If you have thoughts about taking your own life, call a suicide crisis helpline or text a crisis text line. This information is not intended to replace advice given to you by your health care provider. Make sure you discuss any questions you have with your health care provider. Document Revised: 11/08/2018 Document Reviewed: 11/08/2018 Elsevier Patient Education  2022 [employer]. Generalized Anxiety Disorder, Adult Generalized anxiety disorder (GAD) is a mental health condition. Unlike normal worries, anxiety related to GAD is not triggered by a specific event. These worries do not fade or get better with time. GAD interferes with relationships, work, and school. GAD symptoms can vary from mild to severe. People with severe GAD can have intense waves of anxiety with physical symptoms that are similar to panic attacks. What are the causes? The exact cause of GAD is not known, but the following are believed to have an impact: Differences in natural brain chemicals. Genes passed down from parents to children. Differences in the way threats are perceived. Development during childhood. Personality. What increases the risk? The following factors may make you more likely to develop this condition: Being female. Having a family history of anxiety disorders. Being very shy. Experiencing very stressful life events, such as the death of a loved one. Having a very stressful family environment. What are the signs or symptoms? People with GAD often worry excessively about many things in their lives, such as their health and family.  Symptoms may also include: Mental and emotional symptoms: Worrying excessively about natural disasters. Fear of being late. Difficulty concentrating. Fears that others are judging your performance. Physical symptoms: Fatigue. Headaches, muscle tension, muscle  twitches, trembling, or feeling shaky. Feeling like your heart is pounding or beating very fast. Feeling out of breath or like you cannot take a deep breath. Having trouble falling asleep or staying asleep, or experiencing restlessness. Sweating. Nausea, diarrhea, or irritable bowel syndrome (IBS). Behavioral symptoms: Experiencing erratic moods or irritability. Avoidance of new situations. Avoidance of people. Extreme difficulty making decisions. How is this diagnosed? This condition is diagnosed based on your symptoms and medical history. You will also have a physical exam. Your health care provider may perform tests to rule out other possible causes of your symptoms. To be diagnosed with GAD, a person must have anxiety that: Is out of his or her control. Affects several different aspects of his or her life, such as work and relationships. Causes distress that makes him or her unable to take part in normal activities. Includes at least three symptoms of GAD, such as restlessness, fatigue, trouble concentrating, irritability, muscle tension, or sleep problems. Before your health care provider can confirm a diagnosis of GAD, these symptoms must be present more days than they are not, and they must last for 6 months or longer. How is this treated? This condition may be treated with: Medicine. Antidepressant medicine is usually prescribed for long-term daily control. Anti-anxiety medicines may be added in severe cases, especially when panic attacks occur. Talk therapy (psychotherapy). Certain types of talk therapy can be helpful in treating GAD by providing support, education, and guidance. Options include: Cognitive behavioral therapy (CBT). People learn coping skills and self-calming techniques to ease their physical symptoms. They learn to identify unrealistic thoughts and behaviors and to replace them with more appropriate thoughts and behaviors. Acceptance and commitment therapy (ACT).  This treatment teaches people how to be mindful as a way to cope with unwanted thoughts and feelings. Biofeedback. This process trains you to manage your body's response (physiological response) through breathing techniques and relaxation methods. You will work with a therapist while machines are used to monitor your physical symptoms. Stress management techniques. These include yoga, meditation, and exercise. A mental health specialist can help determine which treatment is best for you. Some people see improvement with one type of therapy. However, other people require a combination of therapies. Follow these instructions at home: Lifestyle Maintain a consistent routine and schedule. Anticipate stressful situations. Create a plan, and allow extra time to work with your plan. Practice stress management or self-calming techniques that you have learned from your therapist or your health care provider. General instructions Take over-the-counter and prescription medicines only as told by your health care provider. Understand that you are likely to have setbacks. Accept this and be kind to yourself as you persist to take better care of yourself. Recognize and accept your accomplishments, even if you judge them as small. Keep all follow-up visits as told by your health care provider. This is important. Contact a health care provider if: Your symptoms do not get better. Your symptoms get worse. You have signs of depression, such as: A persistently sad or irritable mood. Loss of enjoyment in activities that used to bring you joy. Change in weight or eating. Changes in sleeping habits. Avoiding friends or family members. Loss of energy for normal tasks. Feelings of guilt or worthlessness. Get help right away if: You have serious thoughts   about hurting yourself or others. If you ever feel like you may hurt yourself or others, or have thoughts about taking your own life, get help right away. Go to  your nearest emergency department or: Call your local emergency services (911 in the [place] Call a suicide crisis helpline, such as the National Suicide Prevention Lifeline at (563) 517-8937. This is open 24 hours a day in the [place] Text the Crisis Text Line at 9495767349 (in the [place] Summary Generalized anxiety disorder (GAD) is a mental health condition that involves worry that is not triggered by a specific event. People with GAD often worry excessively about many things in their lives, such as their health and family. GAD may cause symptoms such as restlessness, trouble concentrating, sleep problems, frequent sweating, nausea, diarrhea, headaches, and trembling or muscle twitching. A mental health specialist can help determine which treatment is best for you. Some people see improvement with one type of therapy. However, other people require a combination of therapies. This information is not intended to replace advice given to you by your health care provider. Make sure you discuss any questions you have with your health care provider. Document Revised: 10/18/2018 Document Reviewed: 10/18/2018 Elsevier Patient Education  2022 [employer] Hypertension, Adult High blood pressure (hypertension) is when the force of blood pumping through the arteries is too strong. The arteries are the blood vessels that carry blood from the heart throughout the body. Hypertension forces the heart to work harder to pump blood and may cause arteries to become narrow or stiff. Untreated or uncontrolled hypertension can cause a heart attack, heart failure, a stroke, kidney disease, and other problems. A blood pressure reading consists of a higher number over a lower number. Ideally, your blood pressure should be below 120/80. The first ("top") number is called the systolic pressure. It is a measure of the pressure in your arteries as your heart beats. The second ("bottom") number is called the diastolic pressure. It is a  measure of the pressure in your arteries as the heart relaxes. What are the causes? The exact cause of this condition is not known. There are some conditions that result in or are related to high blood pressure. What increases the risk? Some risk factors for high blood pressure are under your control. The following factors may make you more likely to develop this condition: Smoking. Having type 2 diabetes mellitus, high cholesterol, or both. Not getting enough exercise or physical activity. Being overweight. Having too much fat, sugar, calories, or salt (sodium) in your diet. Drinking too much alcohol. Some risk factors for high blood pressure may be difficult or impossible to change. Some of these factors include: Having chronic kidney disease. Having a family history of high blood pressure. Age. Risk increases with age. Race. You may be at higher risk if you are African American. Gender. Men are at higher risk than women before age 20. After age 79, women are at higher risk than men. Having obstructive sleep apnea. Stress. What are the signs or symptoms? High blood pressure may not cause symptoms. Very high blood pressure (hypertensive crisis) may cause: Headache. Anxiety. Shortness of breath. Nosebleed. Nausea and vomiting. Vision changes. Severe chest pain. Seizures. How is this diagnosed? This condition is diagnosed by measuring your blood pressure while you are seated, with your arm resting on a flat surface, your legs uncrossed, and your feet flat on the floor. The cuff of the blood pressure monitor will be placed directly against the skin of your  upper arm at the level of your heart. It should be measured at least twice using the same arm. Certain conditions can cause a difference in blood pressure between your right and left arms. Certain factors can cause blood pressure readings to be lower or higher than normal for a short period of time: When your blood pressure is higher  when you are in a health care provider's office than when you are at home, this is called white coat hypertension. Most people with this condition do not need medicines. When your blood pressure is higher at home than when you are in a health care provider's office, this is called masked hypertension. Most people with this condition may need medicines to control blood pressure. If you have a high blood pressure reading during one visit or you have normal blood pressure with other risk factors, you may be asked to: Return on a different day to have your blood pressure checked again. Monitor your blood pressure at home for 1 week or longer. If you are diagnosed with hypertension, you may have other blood or imaging tests to help your health care provider understand your overall risk for other conditions. How is this treated? This condition is treated by making healthy lifestyle changes, such as eating healthy foods, exercising more, and reducing your alcohol intake. Your health care provider may prescribe medicine if lifestyle changes are not enough to get your blood pressure under control, and if: Your systolic blood pressure is above 130. Your diastolic blood pressure is above 80. Your personal target blood pressure may vary depending on your medical conditions, your age, and other factors. Follow these instructions at home: Eating and drinking  Eat a diet that is high in fiber and potassium, and low in sodium, added sugar, and fat. An example eating plan is called the DASH (Dietary Approaches to Stop Hypertension) diet. To eat this way: Eat plenty of fresh fruits and vegetables. Try to fill one half of your plate at each meal with fruits and vegetables. Eat whole grains, such as whole-wheat pasta, brown rice, or whole-grain bread. Fill about one fourth of your plate with whole grains. Eat or drink low-fat dairy products, such as skim milk or low-fat yogurt. Avoid fatty cuts of meat, processed or  cured meats, and poultry with skin. Fill about one fourth of your plate with lean proteins, such as fish, chicken without skin, beans, eggs, or tofu. Avoid pre-made and processed foods. These tend to be higher in sodium, added sugar, and fat. Reduce your daily sodium intake. Most people with hypertension should eat less than 1,500 mg of sodium a day. Do not drink alcohol if: Your health care provider tells you not to drink. You are pregnant, may be pregnant, or are planning to become pregnant. If you drink alcohol: Limit how much you use to: 0-1 drink a day for women. 0-2 drinks a day for men. Be aware of how much alcohol is in your drink. In the [place] one drink equals one 12 oz bottle of beer (355 mL), one 5 oz glass of wine (148 mL), or one 1 oz glass of hard liquor (44 mL). Lifestyle  Work with your health care provider to maintain a healthy body weight or to lose weight. Ask what an ideal weight is for you. Get at least 30 minutes of exercise most days of the week. Activities may include walking, swimming, or biking. Include exercise to strengthen your muscles (resistance exercise), such as Pilates or lifting weights,  as part of your weekly exercise routine. Try to do these types of exercises for 30 minutes at least 3 days a week. Do not use any products that contain nicotine or tobacco, such as cigarettes, e-cigarettes, and chewing tobacco. If you need help quitting, ask your health care provider. Monitor your blood pressure at home as told by your health care provider. Keep all follow-up visits as told by your health care provider. This is important. Medicines Take over-the-counter and prescription medicines only as told by your health care provider. Follow directions carefully. Blood pressure medicines must be taken as prescribed. Do not skip doses of blood pressure medicine. Doing this puts you at risk for problems and can make the medicine less effective. Ask your health care provider  about side effects or reactions to medicines that you should watch for. Contact a health care provider if you: Think you are having a reaction to a medicine you are taking. Have headaches that keep coming back (recurring). Feel dizzy. Have swelling in your ankles. Have trouble with your vision. Get help right away if you: Develop a severe headache or confusion. Have unusual weakness or numbness. Feel faint. Have severe pain in your chest or abdomen. Vomit repeatedly. Have trouble breathing. Summary Hypertension is when the force of blood pumping through your arteries is too strong. If this condition is not controlled, it may put you at risk for serious complications. Your personal target blood pressure may vary depending on your medical conditions, your age, and other factors. For most people, a normal blood pressure is less than 120/80. Hypertension is treated with lifestyle changes, medicines, or a combination of both. Lifestyle changes include losing weight, eating a healthy, low-sodium diet, exercising more, and limiting alcohol. This information is not intended to replace advice given to you by your health care provider. Make sure you discuss any questions you have with your health care provider. Document Revised: 09/07/2017 Document Reviewed: 09/07/2017 Elsevier Patient Education  2022 [employer].

## 2020-10-22 NOTE — Assessment & Plan Note (Signed)
Patient acknowledges depression but do not want treatment at this time. Completed PHQ 9. Education provided to patient on depression management.  Follow up as needed

## 2020-10-22 NOTE — Assessment & Plan Note (Signed)
Patient do not want anxiety treated with medication at this time. Completed GAD-7 Follow up as needed. Education provided with printed hand out given.

## 2020-10-22 NOTE — Assessment & Plan Note (Signed)
Patient has not had any medication for BP in 7 months, blood pressure not well controlled, restarted patient on lisinopril and provided eduction to patient on low sodium diet, hydration exercise. Take blood pressure log for a week and follow up in 2 weeks.   RX sent to pharmacy.

## 2020-10-27 ENCOUNTER — Inpatient Hospital Stay (HOSPITAL_COMMUNITY): Payer: Medicaid Other

## 2020-10-27 ENCOUNTER — Encounter (HOSPITAL_COMMUNITY): Payer: Self-pay | Admitting: Emergency Medicine

## 2020-10-27 ENCOUNTER — Inpatient Hospital Stay (HOSPITAL_COMMUNITY)
Admission: EM | Admit: 2020-10-27 | Discharge: 2020-10-29 | DRG: 092 | Disposition: A | Discharge status: Home / Self Care | Payer: Medicaid Other | Attending: Family Medicine | Admitting: Family Medicine

## 2020-10-27 ENCOUNTER — Emergency Department (HOSPITAL_COMMUNITY): Payer: Medicaid Other

## 2020-10-27 ENCOUNTER — Other Ambulatory Visit: Payer: Self-pay

## 2020-10-27 DIAGNOSIS — F17210 Nicotine dependence, cigarettes, uncomplicated: Secondary | ICD-10-CM | POA: Diagnosis present

## 2020-10-27 DIAGNOSIS — E785 Hyperlipidemia, unspecified: Secondary | ICD-10-CM | POA: Diagnosis present

## 2020-10-27 DIAGNOSIS — F332 Major depressive disorder, recurrent severe without psychotic features: Secondary | ICD-10-CM | POA: Diagnosis present

## 2020-10-27 DIAGNOSIS — I69398 Other sequelae of cerebral infarction: Secondary | ICD-10-CM | POA: Diagnosis not present

## 2020-10-27 DIAGNOSIS — F1121 Opioid dependence, in remission: Secondary | ICD-10-CM | POA: Diagnosis not present

## 2020-10-27 DIAGNOSIS — I69349 Monoplegia of lower limb following cerebral infarction affecting unspecified side: Secondary | ICD-10-CM | POA: Diagnosis not present

## 2020-10-27 DIAGNOSIS — F1410 Cocaine abuse, uncomplicated: Secondary | ICD-10-CM | POA: Diagnosis present

## 2020-10-27 DIAGNOSIS — G8384 Todd's paralysis (postepileptic): Principal | ICD-10-CM | POA: Diagnosis present

## 2020-10-27 DIAGNOSIS — I6389 Other cerebral infarction: Secondary | ICD-10-CM | POA: Diagnosis not present

## 2020-10-27 DIAGNOSIS — J441 Chronic obstructive pulmonary disease with (acute) exacerbation: Secondary | ICD-10-CM | POA: Diagnosis present

## 2020-10-27 DIAGNOSIS — F17200 Nicotine dependence, unspecified, uncomplicated: Secondary | ICD-10-CM | POA: Diagnosis not present

## 2020-10-27 DIAGNOSIS — Z888 Allergy status to other drugs, medicaments and biological substances status: Secondary | ICD-10-CM | POA: Diagnosis not present

## 2020-10-27 DIAGNOSIS — J449 Chronic obstructive pulmonary disease, unspecified: Secondary | ICD-10-CM | POA: Diagnosis present

## 2020-10-27 DIAGNOSIS — I10 Essential (primary) hypertension: Secondary | ICD-10-CM | POA: Diagnosis present

## 2020-10-27 DIAGNOSIS — F1490 Cocaine use, unspecified, uncomplicated: Secondary | ICD-10-CM

## 2020-10-27 DIAGNOSIS — G9389 Other specified disorders of brain: Secondary | ICD-10-CM | POA: Diagnosis present

## 2020-10-27 DIAGNOSIS — Z20822 Contact with and (suspected) exposure to covid-19: Secondary | ICD-10-CM | POA: Diagnosis present

## 2020-10-27 DIAGNOSIS — G8191 Hemiplegia, unspecified affecting right dominant side: Secondary | ICD-10-CM | POA: Diagnosis not present

## 2020-10-27 DIAGNOSIS — G40909 Epilepsy, unspecified, not intractable, without status epilepticus: Secondary | ICD-10-CM | POA: Diagnosis not present

## 2020-10-27 DIAGNOSIS — Z885 Allergy status to narcotic agent status: Secondary | ICD-10-CM | POA: Diagnosis not present

## 2020-10-27 DIAGNOSIS — R471 Dysarthria and anarthria: Secondary | ICD-10-CM | POA: Diagnosis not present

## 2020-10-27 DIAGNOSIS — R4781 Slurred speech: Secondary | ICD-10-CM | POA: Diagnosis not present

## 2020-10-27 DIAGNOSIS — Z8249 Family history of ischemic heart disease and other diseases of the circulatory system: Secondary | ICD-10-CM

## 2020-10-27 DIAGNOSIS — I639 Cerebral infarction, unspecified: Secondary | ICD-10-CM | POA: Diagnosis not present

## 2020-10-27 DIAGNOSIS — I700 Atherosclerosis of aorta: Secondary | ICD-10-CM | POA: Diagnosis present

## 2020-10-27 DIAGNOSIS — F1110 Opioid abuse, uncomplicated: Secondary | ICD-10-CM | POA: Diagnosis present

## 2020-10-27 DIAGNOSIS — R531 Weakness: Secondary | ICD-10-CM | POA: Diagnosis not present

## 2020-10-27 DIAGNOSIS — F1120 Opioid dependence, uncomplicated: Secondary | ICD-10-CM | POA: Diagnosis present

## 2020-10-27 DIAGNOSIS — R0689 Other abnormalities of breathing: Secondary | ICD-10-CM | POA: Diagnosis not present

## 2020-10-27 DIAGNOSIS — F519 Sleep disorder not due to a substance or known physiological condition, unspecified: Secondary | ICD-10-CM

## 2020-10-27 LAB — URINALYSIS, ROUTINE W REFLEX MICROSCOPIC
Bacteria, UA: NONE SEEN
Bilirubin Urine: NEGATIVE
Glucose, UA: >=500 mg/dL — AB
Hgb urine dipstick: NEGATIVE
Ketones, ur: NEGATIVE mg/dL
Leukocytes,Ua: NEGATIVE
Nitrite: NEGATIVE
Protein, ur: NEGATIVE mg/dL
Specific Gravity, Urine: 1.02 (ref 1.005–1.030)
pH: 5 (ref 5.0–8.0)

## 2020-10-27 LAB — DIFFERENTIAL
Abs Immature Granulocytes: 0.02 10*3/uL (ref 0.00–0.07)
Basophils Absolute: 0.1 10*3/uL (ref 0.0–0.1)
Basophils Relative: 1 %
Eosinophils Absolute: 0.4 10*3/uL (ref 0.0–0.5)
Eosinophils Relative: 7 %
Immature Granulocytes: 0 %
Lymphocytes Relative: 11 %
Lymphs Abs: 0.6 10*3/uL — ABNORMAL LOW (ref 0.7–4.0)
Monocytes Absolute: 0.4 10*3/uL (ref 0.1–1.0)
Monocytes Relative: 8 %
Neutro Abs: 3.8 10*3/uL (ref 1.7–7.7)
Neutrophils Relative %: 73 %

## 2020-10-27 LAB — RAPID URINE DRUG SCREEN, HOSP PERFORMED
Amphetamines: NOT DETECTED
Barbiturates: NOT DETECTED
Benzodiazepines: NOT DETECTED
Cocaine: NOT DETECTED
Opiates: NOT DETECTED
Tetrahydrocannabinol: NOT DETECTED

## 2020-10-27 LAB — COMPREHENSIVE METABOLIC PANEL
ALT: 13 U/L (ref 0–44)
AST: 22 U/L (ref 15–41)
Albumin: 4.4 g/dL (ref 3.5–5.0)
Alkaline Phosphatase: 89 U/L (ref 38–126)
Anion gap: 4 — ABNORMAL LOW (ref 5–15)
BUN: 17 mg/dL (ref 6–20)
CO2: 30 mmol/L (ref 22–32)
Calcium: 9.2 mg/dL (ref 8.9–10.3)
Chloride: 103 mmol/L (ref 98–111)
Creatinine, Ser: 0.72 mg/dL (ref 0.44–1.00)
GFR, Estimated: >60 mL/min (ref >60)
Glucose, Bld: 113 mg/dL — ABNORMAL HIGH (ref 70–99)
Potassium: 3.8 mmol/L (ref 3.5–5.1)
Sodium: 137 mmol/L (ref 135–145)
Total Bilirubin: 0.5 mg/dL (ref 0.3–1.2)
Total Protein: 8.1 g/dL (ref 6.5–8.1)

## 2020-10-27 LAB — CBC
HCT: 41.3 % (ref 36.0–46.0)
Hemoglobin: 13.5 g/dL (ref 12.0–15.0)
MCH: 29.9 pg (ref 26.0–34.0)
MCHC: 32.7 g/dL (ref 30.0–36.0)
MCV: 91.4 fL (ref 80.0–100.0)
Platelets: 199 10*3/uL (ref 150–400)
RBC: 4.52 MIL/uL (ref 3.87–5.11)
RDW: 12.7 % (ref 11.5–15.5)
WBC: 5.2 10*3/uL (ref 4.0–10.5)
nRBC: 0 % (ref 0.0–0.2)

## 2020-10-27 LAB — PROTIME-INR
INR: 1 (ref 0.8–1.2)
Prothrombin Time: 13.3 seconds (ref 11.4–15.2)

## 2020-10-27 LAB — TSH: TSH: 1.979 u[IU]/mL (ref 0.350–4.500)

## 2020-10-27 LAB — BRAIN NATRIURETIC PEPTIDE: B Natriuretic Peptide: 7 pg/mL (ref 0.0–100.0)

## 2020-10-27 LAB — HEMOGLOBIN A1C
Hgb A1c MFr Bld: 5.5 % (ref 4.8–5.6)
Mean Plasma Glucose: 111.15 mg/dL

## 2020-10-27 LAB — PREGNANCY, URINE: Preg Test, Ur: NEGATIVE

## 2020-10-27 LAB — ETHANOL: Alcohol, Ethyl (B): <10 mg/dL (ref <10)

## 2020-10-27 LAB — RESP PANEL BY RT-PCR (FLU A&B, COVID) ARPGX2
Influenza A by PCR: NEGATIVE
Influenza B by PCR: NEGATIVE
SARS Coronavirus 2 by RT PCR: NEGATIVE

## 2020-10-27 LAB — LIPID PANEL
Cholesterol: 174 mg/dL (ref 0–200)
HDL: 50 mg/dL (ref >40)
LDL Cholesterol: 107 mg/dL — ABNORMAL HIGH (ref 0–99)
Total CHOL/HDL Ratio: 3.5 RATIO
Triglycerides: 87 mg/dL (ref <150)
VLDL: 17 mg/dL (ref 0–40)

## 2020-10-27 LAB — APTT: aPTT: 28 seconds (ref 24–36)

## 2020-10-27 LAB — CBG MONITORING, ED: Glucose-Capillary: 114 mg/dL — ABNORMAL HIGH (ref 70–99)

## 2020-10-27 LAB — SEDIMENTATION RATE: Sed Rate: 18 mm/hr (ref 0–22)

## 2020-10-27 LAB — C-REACTIVE PROTEIN: CRP: 0.7 mg/dL (ref <1.0)

## 2020-10-27 LAB — HIV ANTIBODY (ROUTINE TESTING W REFLEX): HIV Screen 4th Generation wRfx: NONREACTIVE

## 2020-10-27 MED ORDER — ACETAMINOPHEN 650 MG RE SUPP: 650.0000 mg | Freq: Four times a day (QID) | RECTAL | Status: DC | PRN

## 2020-10-27 MED ORDER — NICOTINE 14 MG/24HR TD PT24
14.0000 mg | MEDICATED_PATCH | Freq: Every day | TRANSDERMAL | Status: DC
  Administered 2020-10-27 – 2020-10-29 (×3): 14 mg via TRANSDERMAL
  Filled 2020-10-27 (×3): qty 1

## 2020-10-27 MED ORDER — SODIUM CHLORIDE 0.9 % IV SOLN: INTRAVENOUS | Status: DC

## 2020-10-27 MED ORDER — ASPIRIN 325 MG PO TABS
325.0000 mg | ORAL_TABLET | Freq: Once | ORAL | Status: AC
  Administered 2020-10-27: 325 mg via ORAL
  Filled 2020-10-27: qty 1

## 2020-10-27 MED ORDER — ASPIRIN 300 MG RE SUPP: 300.0000 mg | Freq: Once | RECTAL | Status: AC

## 2020-10-27 MED ORDER — ATORVASTATIN CALCIUM 40 MG PO TABS
80.0000 mg | ORAL_TABLET | Freq: Every day | ORAL | Status: DC
  Administered 2020-10-27 – 2020-10-29 (×3): 80 mg via ORAL
  Filled 2020-10-27 (×3): qty 2

## 2020-10-27 MED ORDER — HEPARIN SODIUM (PORCINE) 5000 UNIT/ML IJ SOLN
5000.0000 [IU] | Freq: Three times a day (TID) | INTRAMUSCULAR | Status: DC
  Administered 2020-10-27 – 2020-10-29 (×5): 5000 [IU] via SUBCUTANEOUS
  Filled 2020-10-27 (×6): qty 1

## 2020-10-27 MED ORDER — ONDANSETRON HCL 4 MG/2ML IJ SOLN: 4.0000 mg | Freq: Four times a day (QID) | INTRAMUSCULAR | Status: DC | PRN

## 2020-10-27 MED ORDER — TRAZODONE HCL 50 MG PO TABS: 25.0000 mg | ORAL_TABLET | Freq: Every evening | ORAL | Status: DC | PRN

## 2020-10-27 MED ORDER — CLOPIDOGREL BISULFATE 75 MG PO TABS
75.0000 mg | ORAL_TABLET | Freq: Every day | ORAL | Status: DC
  Administered 2020-10-28 – 2020-10-29 (×2): 75 mg via ORAL
  Filled 2020-10-27 (×2): qty 1

## 2020-10-27 MED ORDER — IPRATROPIUM BROMIDE 0.02 % IN SOLN: 0.5000 mg | Freq: Four times a day (QID) | RESPIRATORY_TRACT | Status: DC | PRN

## 2020-10-27 MED ORDER — IOHEXOL 350 MG/ML SOLN
100.0000 mL | Freq: Once | INTRAVENOUS | Status: AC | PRN
  Administered 2020-10-27: 75 mL via INTRAVENOUS

## 2020-10-27 MED ORDER — SENNOSIDES-DOCUSATE SODIUM 8.6-50 MG PO TABS: 1.0000 | ORAL_TABLET | Freq: Every evening | ORAL | Status: DC | PRN

## 2020-10-27 MED ORDER — ASPIRIN EC 81 MG PO TBEC
81.0000 mg | DELAYED_RELEASE_TABLET | Freq: Every day | ORAL | Status: DC
  Administered 2020-10-28 – 2020-10-29 (×2): 81 mg via ORAL
  Filled 2020-10-27 (×2): qty 1

## 2020-10-27 MED ORDER — SODIUM CHLORIDE 0.9% FLUSH
3.0000 mL | Freq: Two times a day (BID) | INTRAVENOUS | Status: DC
  Administered 2020-10-27 – 2020-10-29 (×4): 3 mL via INTRAVENOUS

## 2020-10-27 MED ORDER — SODIUM CHLORIDE 0.9% FLUSH: 3.0000 mL | INTRAVENOUS | Status: DC | PRN

## 2020-10-27 MED ORDER — LEVALBUTEROL HCL 0.63 MG/3ML IN NEBU: 0.6300 mg | INHALATION_SOLUTION | Freq: Four times a day (QID) | RESPIRATORY_TRACT | Status: DC | PRN

## 2020-10-27 MED ORDER — OXYCODONE HCL 5 MG PO TABS
5.0000 mg | ORAL_TABLET | ORAL | Status: DC | PRN
Start: 2020-10-27 — End: 2020-10-29

## 2020-10-27 MED ORDER — ACETAMINOPHEN 325 MG PO TABS: 650.0000 mg | ORAL_TABLET | Freq: Four times a day (QID) | ORAL | Status: DC | PRN

## 2020-10-27 MED ORDER — SODIUM CHLORIDE 0.9% FLUSH
3.0000 mL | Freq: Two times a day (BID) | INTRAVENOUS | Status: DC
  Administered 2020-10-28: 3 mL via INTRAVENOUS

## 2020-10-27 MED ORDER — SODIUM CHLORIDE 0.9 % IV SOLN: 250.0000 mL | INTRAVENOUS | Status: DC | PRN

## 2020-10-27 MED ORDER — ONDANSETRON HCL 4 MG PO TABS: 4.0000 mg | ORAL_TABLET | Freq: Four times a day (QID) | ORAL | Status: DC | PRN

## 2020-10-27 MED ORDER — CLOPIDOGREL BISULFATE 75 MG PO TABS
300.0000 mg | ORAL_TABLET | Freq: Once | ORAL | Status: AC
  Administered 2020-10-27: 300 mg via ORAL
  Filled 2020-10-27: qty 4

## 2020-10-27 MED ORDER — CLOPIDOGREL BISULFATE 75 MG PO TABS: 300.0000 mg | ORAL_TABLET | Freq: Every day | ORAL | Status: DC

## 2020-10-27 MED ORDER — BUPRENORPHINE HCL-NALOXONE HCL 8-2 MG SL SUBL
1.0000 | SUBLINGUAL_TABLET | Freq: Every day | SUBLINGUAL | Status: DC
  Administered 2020-10-27 – 2020-10-28 (×2): 1 via SUBLINGUAL
  Filled 2020-10-27 (×3): qty 1

## 2020-10-27 MED ORDER — BACLOFEN 10 MG PO TABS: 10.0000 mg | ORAL_TABLET | Freq: Three times a day (TID) | ORAL | Status: DC | PRN

## 2020-10-27 MED ORDER — CLOPIDOGREL BISULFATE 75 MG PO TABS: 75.0000 mg | ORAL_TABLET | Freq: Every day | ORAL | Status: DC

## 2020-10-27 MED ORDER — BISACODYL 5 MG PO TBEC: 5.0000 mg | DELAYED_RELEASE_TABLET | Freq: Every day | ORAL | Status: DC | PRN

## 2020-10-27 MED ORDER — HYDRALAZINE HCL 20 MG/ML IJ SOLN: 10.0000 mg | INTRAMUSCULAR | Status: DC | PRN

## 2020-10-27 NOTE — ED Notes (Signed)
Pt's visitor at bedside.
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
## 2020-10-27 NOTE — ED Notes (Signed)
Pt ambulated to restroom with no assistance. ?
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
## 2020-10-27 NOTE — ED Triage Notes (Signed)
Pt arrived by [facility] for code stroke. LKW 2130 yesterday. 10/26/20. Pt has right arm weakness. Pt has had previous stroke and has had right leg weakness since previous stroke.  Pt has had drug use for 40 years. Been off drugs for 7 months.

## 2020-10-27 NOTE — ED Notes (Signed)
ED TO INPATIENT HANDOFF REPORT  ED Nurse Name and Phone #:    S Name/Age/Gender Alice [name] 55 y.o. female Room/Bed: APAH2/APAH2  Code Status   Code Status: Full Code  Home/SNF/Other Home Patient oriented to: self, place, time, and situation Is this baseline? Yes   Triage Complete: Triage complete  Chief Complaint CVA (cerebral vascular accident) [facility]) [I63.9]  Triage Note Pt arrived by [facility] for code stroke. LKW 2130 yesterday. 10/26/20. Pt has right arm weakness. Pt has had previous stroke and has had right leg weakness since previous stroke.  Pt has had drug use for 40 years. Been off drugs for 7 months.   Allergies Allergies  Allergen Reactions   Benadryl [Diphenhydramine Hcl] Hives   Codeine Rash   Tetracycline Itching and Rash    Level of Care/Admitting Diagnosis ED Disposition     ED Disposition  Admit   Condition  --   Comment  Hospital Area: [facility] [[id number]]  Level of Care: Telemetry [5]  Covid Evaluation: Asymptomatic Screening Protocol (No Symptoms)  Diagnosis: CVA (cerebral vascular accident) [facility]) [[id number]]  Admitting Physician: Felipa Furnace  Attending Physician: Felipa Furnace  Estimated length of stay: 3 - 4 days  Certification:: I certify this patient will need inpatient services for at least 2 midnights          B Medical/Surgery History 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       A IV Location/Drains/Wounds Patient Lines/Drains/Airways Status     Active Line/Drains/Airways     Name Placement date Placement time Site Days   Peripheral IV 10/27/20 18 G Left Antecubital 10/27/20  1119  Antecubital  less than 1            Intake/Output Last 24 hours No intake or output data in the 24 hours ending 10/27/20 2046  Labs/Imaging Results for orders placed or performed during the hospital encounter of 10/27/20 (from the past 48 hour(s))  Ethanol     Status: None   Collection Time: 10/27/20 10:05 AM  Result Value Ref Range   Alcohol, Ethyl (B) <10 <10 mg/dL    Comment: (NOTE) Lowest detectable limit for serum alcohol is 10 mg/dL.  For medical purposes only. Performed at [facility], [address] [place], [place] [address]   CBG monitoring, ED     Status: Abnormal   Collection Time: 10/27/20 10:23 AM  Result Value Ref Range   Glucose-Capillary 114 (H) 70 - 99 mg/dL    Comment: Glucose reference range applies only to samples taken after fasting for at least 8 hours.  TSH     Status: None   Collection Time: 10/27/20 10:48 AM  Result Value Ref Range   TSH 1.979 0.350 -  4.500 uIU/mL    Comment: Performed by a 3rd Generation assay with a functional sensitivity of <=0.01 uIU/mL. Performed at [facility], [address] [place], [place] [address]   Lipid panel     Status: Abnormal   Collection Time: 10/27/20 10:48 AM  Result Value Ref Range   Cholesterol 174 0 - 200 mg/dL   Triglycerides 87 <419 mg/dL   HDL 50 >62 mg/dL   Total CHOL/HDL Ratio 3.5 RATIO   VLDL 17 0 - 40 mg/dL   LDL Cholesterol 229 (H) 0 - 99 mg/dL    Comment:        Total Cholesterol/HDL:CHD Risk Coronary Heart Disease Risk Table                     Men   Women  1/2 Average Risk   3.4   3.3  Average Risk       5.0   4.4  2 X Average Risk   9.6   7.1  3 X Average Risk  23.4   11.0        Use the calculated Patient Ratio above and the CHD Risk Table to  determine the patient's CHD Risk.        ATP III CLASSIFICATION (LDL):  <100     mg/dL   Optimal  798-921  mg/dL   Near or Above                    Optimal  130-159  mg/dL   Borderline  194-174  mg/dL   High  >081     mg/dL   Very High Performed at [facility], [address] [place], [place] [address]   Hemoglobin A1c     Status: None   Collection Time: 10/27/20 10:48 AM  Result Value Ref Range   Hgb A1c MFr Bld 5.5 4.8 - 5.6 %    Comment: (NOTE) Pre diabetes:          5.7%-6.4%  Diabetes:              >6.4%  Glycemic control for   <7.0% adults with diabetes    Mean Plasma Glucose 111.15 mg/dL    Comment: Performed at [facility], [address] [place], [place] [address]  Brain natriuretic peptide     Status: None   Collection Time: 10/27/20 10:48 AM  Result Value Ref Range   B Natriuretic Peptide 7.0 0.0 - 100.0 pg/mL    Comment: Performed at [facility], [address], [place], [place] [address]  Protime-INR     Status: None   Collection Time: 10/27/20 10:54 AM  Result Value Ref Range   Prothrombin Time 13.3 11.4 - 15.2 seconds   INR 1.0 0.8 - 1.2    Comment: (NOTE) INR goal varies based on device and disease states. Performed at [facility], [address], [place], [place] [address]   APTT     Status: None   Collection Time: 10/27/20 10:54 AM  Result Value Ref Range   aPTT 28 24 - 36 seconds    Comment: Performed at [facility], [address], [place], [place] [address]  CBC     Status: None   Collection Time: 10/27/20 10:54 AM  Result Value Ref Range   WBC 5.2 4.0 - 10.5 K/uL   RBC 4.52 3.87 - 5.11 MIL/uL   Hemoglobin 13.5 12.0 - 15.0 g/dL   HCT 74.1 28.7 - 86.7 %  MCV 91.4 80.0 - 100.0 fL   MCH 29.9 26.0 - 34.0 pg   MCHC 32.7 30.0 - 36.0 g/dL   RDW 76.7 20.9 - 47.0 %   Platelets 199 150 - 400 K/uL   nRBC 0.0 0.0 - 0.2 %    Comment: Performed at [facility], [address], [place], [place] [address]  Differential     Status: Abnormal    Collection Time: 10/27/20 10:54 AM  Result Value Ref Range   Neutrophils Relative % 73 %   Neutro Abs 3.8 1.7 - 7.7 K/uL   Lymphocytes Relative 11 %   Lymphs Abs 0.6 (L) 0.7 - 4.0 K/uL   Monocytes Relative 8 %   Monocytes Absolute 0.4 0.1 - 1.0 K/uL   Eosinophils Relative 7 %   Eosinophils Absolute 0.4 0.0 - 0.5 K/uL   Basophils Relative 1 %   Basophils Absolute 0.1 0.0 - 0.1 K/uL   Immature Granulocytes 0 %   Abs Immature Granulocytes 0.02 0.00 - 0.07 K/uL    Comment: Performed at [facility], [address], [place], [place] [address]  Comprehensive metabolic panel     Status: Abnormal   Collection Time: 10/27/20 10:54 AM  Result Value Ref Range   Sodium 137 135 - 145 mmol/L   Potassium 3.8 3.5 - 5.1 mmol/L   Chloride 103 98 - 111 mmol/L   CO2 30 22 - 32 mmol/L   Glucose, Bld 113 (H) 70 - 99 mg/dL    Comment: Glucose reference range applies only to samples taken after fasting for at least 8 hours.   BUN 17 6 - 20 mg/dL   Creatinine, Ser 7.65 0.44 - 1.00 mg/dL   Calcium 9.2 8.9 - 46.5 mg/dL   Total Protein 8.1 6.5 - 8.1 g/dL   Albumin 4.4 3.5 - 5.0 g/dL   AST 22 15 - 41 U/L   ALT 13 0 - 44 U/L   Alkaline Phosphatase 89 38 - 126 U/L   Total Bilirubin 0.5 0.3 - 1.2 mg/dL   GFR, Estimated >03 >54 mL/min    Comment: (NOTE) Calculated using the CKD-EPI Creatinine Equation (2021)    Anion gap 4 (L) 5 - 15    Comment: Performed at [facility], [address], [place], [place] [address]  Urine rapid drug screen (hosp performed)     Status: None   Collection Time: 10/27/20 11:14 AM  Result Value Ref Range   Opiates NONE DETECTED NONE DETECTED   Cocaine NONE DETECTED NONE DETECTED   Benzodiazepines NONE DETECTED NONE DETECTED   Amphetamines NONE DETECTED NONE DETECTED   Tetrahydrocannabinol NONE DETECTED NONE DETECTED   Barbiturates NONE DETECTED NONE DETECTED    Comment: (NOTE) DRUG SCREEN FOR MEDICAL PURPOSES ONLY.  IF CONFIRMATION IS NEEDED FOR ANY PURPOSE, NOTIFY  LAB WITHIN 5 DAYS.  LOWEST DETECTABLE LIMITS FOR URINE DRUG SCREEN Drug Class                     Cutoff (ng/mL) Amphetamine and metabolites    1000 Barbiturate and metabolites    200 Benzodiazepine                 200 Tricyclics and metabolites     300 Opiates and metabolites        300 Cocaine and metabolites        300 THC  50 Performed at [facility], [address] [place], [place] [address]   Urinalysis, Routine w reflex microscopic Nasopharyngeal Swab     Status: Abnormal   Collection Time: 10/27/20 11:14 AM  Result Value Ref Range   Color, Urine YELLOW YELLOW   APPearance CLEAR CLEAR   Specific Gravity, Urine 1.020 1.005 - 1.030   pH 5.0 5.0 - 8.0   Glucose, UA >=500 (A) NEGATIVE mg/dL   Hgb urine dipstick NEGATIVE NEGATIVE   Bilirubin Urine NEGATIVE NEGATIVE   Ketones, ur NEGATIVE NEGATIVE mg/dL   Protein, ur NEGATIVE NEGATIVE mg/dL   Nitrite NEGATIVE NEGATIVE   Leukocytes,Ua NEGATIVE NEGATIVE   RBC / HPF 0-5 0 - 5 RBC/hpf   WBC, UA 0-5 0 - 5 WBC/hpf   Bacteria, UA NONE SEEN NONE SEEN   Squamous Epithelial / LPF 0-5 0 - 5   Mucus PRESENT     Comment: Performed at [facility], [address], [place], [place] [address]  Resp Panel by RT-PCR (Flu A&B, Covid) Nasopharyngeal Swab     Status: None   Collection Time: 10/27/20 11:15 AM   Specimen: Nasopharyngeal Swab; Nasopharyngeal(NP) swabs in vial transport medium  Result Value Ref Range   SARS Coronavirus 2 by RT PCR NEGATIVE NEGATIVE    Comment: (NOTE) SARS-CoV-2 target nucleic acids are NOT DETECTED.  The SARS-CoV-2 RNA is generally detectable in upper respiratory specimens during the acute phase of infection. The lowest concentration of SARS-CoV-2 viral copies this assay can detect is 138 copies/mL. A negative result does not preclude SARS-Cov-2 infection and should not be used as the sole basis for treatment or other patient management decisions. A negative result may occur with   improper specimen collection/handling, submission of specimen other than nasopharyngeal swab, presence of viral mutation(s) within the areas targeted by this assay, and inadequate number of viral copies(<138 copies/mL). A negative result must be combined with clinical observations, patient history, and epidemiological information. The expected result is Negative.  Fact Sheet for Patients:  BloggerCourse.com  Fact Sheet for Healthcare Providers:  SeriousBroker.it  This test is no t yet approved or cleared by the [place] FDA and  has been authorized for detection and/or diagnosis of SARS-CoV-2 by FDA under an Emergency Use Authorization (EUA). This EUA will remain  in effect (meaning this test can be used) for the duration of the COVID-19 declaration under Section 564(b)(1) of the Act, 21 U.S.C.section 360bbb-3(b)(1), unless the authorization is terminated  or revoked sooner.       Influenza A by PCR NEGATIVE NEGATIVE   Influenza B by PCR NEGATIVE NEGATIVE    Comment: (NOTE) The Xpert Xpress SARS-CoV-2/FLU/RSV plus assay is intended as an aid in the diagnosis of influenza from Nasopharyngeal swab specimens and should not be used as a sole basis for treatment. Nasal washings and aspirates are unacceptable for Xpert Xpress SARS-CoV-2/FLU/RSV testing.  Fact Sheet for Patients: BloggerCourse.com  Fact Sheet for Healthcare Providers: SeriousBroker.it  This test is not yet approved or cleared by the [place] FDA and has been authorized for detection and/or diagnosis of SARS-CoV-2 by FDA under an Emergency Use Authorization (EUA). This EUA will remain in effect (meaning this test can be used) for the duration of the COVID-19 declaration under Section 564(b)(1) of the Act, 21 U.S.C. section 360bbb-3(b)(1), unless the authorization is terminated or revoked.  Performed at  [facility], [address] [place], [place] [address]   Pregnancy, urine     Status: None   Collection  Time: 10/27/20 11:15 AM  Result Value Ref Range   Preg Test, Ur NEGATIVE NEGATIVE    Comment:        THE SENSITIVITY OF THIS METHODOLOGY IS >20 mIU/mL. Performed at [facility], [address] [place], [place] [address]   HIV Antibody (routine testing w rflx)     Status: None   Collection Time: 10/27/20 12:02 PM  Result Value Ref Range   HIV Screen 4th Generation wRfx Non Reactive Non Reactive    Comment: Performed at [facility], [address], [place], [place] [address]  Sedimentation rate     Status: None   Collection Time: 10/27/20 12:02 PM  Result Value Ref Range   Sed Rate 18 0 - 22 mm/hr    Comment: Performed at [facility], [address] [place], [place] [address]  C-reactive protein     Status: None   Collection Time: 10/27/20 12:02 PM  Result Value Ref Range   CRP 0.7 <1.0 mg/dL    Comment: Performed at [facility], [address], [place], [place] [address]   CT ANGIO HEAD NECK W WO CM  Result Date: 10/27/2020 CLINICAL DATA:  Neurological deficit, acute, stroke suspected. Dizziness over the last 3 weeks. Right-sided weakness. EXAM: CT ANGIOGRAPHY HEAD AND NECK TECHNIQUE: Multidetector CT imaging of the head and neck was performed using the standard protocol during bolus administration of intravenous contrast. Multiplanar CT image reconstructions and MIPs were obtained to evaluate the vascular anatomy. Carotid stenosis measurements (when applicable) are obtained utilizing NASCET criteria, using the distal internal carotid diameter as the denominator. CONTRAST:  22mL OMNIPAQUE IOHEXOL 350 MG/ML SOLN COMPARISON:  MRI CT studies earlier same day. FINDINGS: CTA NECK FINDINGS Aortic arch: Aortic atherosclerosis. No aneurysm. Branching pattern is normal without origin stenosis. Right carotid system: Common carotid artery widely patent to the bifurcation. Carotid  bifurcation shows minimal calcified plaque but no stenosis. Cervical ICA appears normal. Left carotid system: Common carotid artery widely patent to the bifurcation. Carotid bifurcation shows minimal atherosclerotic plaque but no stenosis. Cervical ICA widely patent. Vertebral arteries: Both vertebral artery origins are widely patent. Both vertebral arteries appear normal through the cervical region to the foramen magnum. Skeleton: Chronic cervical spondylosis particularly at C6-7. There may be some old post traumatic deformity at this level. Moderate bony canal narrowing. Old healed superior endplate compression fractures at T4 and T6. Other neck: No mass or lymphadenopathy. 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 Upper chest: 5 mm subpleural pulmonary nodule anteriorly in the right lung image 12. 5 mm subpleural pulmonary nodule posteriorly in the right upper lobe image 49. These are unchanged since 2018 and therefore benign. Review of the MIP images confirms the above findings CTA HEAD FINDINGS Anterior circulation: Both internal carotid arteries are widely patent through the skull base and siphon regions. No siphon stenosis. The anterior and middle cerebral vessels are normal without branch vessel occlusion, proximal stenosis, aneurysm or vascular malformation. Posterior circulation: Both vertebral arteries are widely patent to the basilar. No basilar stenosis. Posterior circulation branch vessels are normal. Venous sinuses: Patent and normal. Anatomic variants: None significant. Review of the MIP images confirms the above findings IMPRESSION: No acute large vessel occlusion. Aortic atherosclerosis, mild. Atherosclerotic calcification at both carotid bifurcations, mild. No stenosis or particular irregularity. No abnormal intracranial vascular finding. Electronically Signed   By: Paulina Fusi M.D.   On: 10/27/2020 13:00   MR BRAIN WO CONTRAST  Result  Date:  10/27/2020 CLINICAL DATA:  Neuro deficit, acute, stroke suspected EXAM: MRI HEAD WITHOUT CONTRAST TECHNIQUE: Multiplanar, multiecho pulse sequences of the brain and surrounding structures were obtained without intravenous contrast. COMPARISON:  None. FINDINGS: Brain: There is no acute infarction or intracranial hemorrhage. There is no intracranial mass, mass effect, or edema. There is no hydrocephalus or extra-axial fluid collection. There are a few scattered foci of cortical/subcortical susceptibility likely reflecting chronic blood products. Two of these areas are associated with left parietal infarcts that were seen on the CT. A few additional small foci of T2 hyperintensity in the supratentorial white matter are nonspecific may reflect minor chronic microvascular ischemic changes. Ventricles and sulci are normal in size and configuration. Vascular: Major vessel flow voids at the skull base are preserved. Skull and upper cervical spine: Normal marrow signal is preserved. Sinuses/Orbits: Paranasal sinuses are aerated. Orbits are unremarkable. Other: Sella is unremarkable.  Mastoid air cells are clear. IMPRESSION: No acute infarction, hemorrhage, or mass. Two small chronic left parietal infarcts with chronic blood products. Few additional foci of chronic cortical/subcortical blood products are present. Minor chronic microvascular ischemic changes. Electronically Signed   By: Guadlupe Spanish M.D.   On: 10/27/2020 13:10   CT HEAD CODE STROKE WO CONTRAST  Result Date: 10/27/2020 CLINICAL DATA:  Code stroke.  Right-sided weakness EXAM: CT HEAD WITHOUT CONTRAST TECHNIQUE: Contiguous axial images were obtained from the base of the skull through the vertex without intravenous contrast. COMPARISON:  2018 FINDINGS: Brain: There is no acute intracranial hemorrhage, mass effect or edema. Small area of hypodensity in the left parietal lobe (series 2, image 23) likely involving white matter but possibly also cortex.  Ventricles and sulci are normal in size and configuration. There is a small chronic left parietal infarct. Vascular: No hyperdense vessel. Skull: Unremarkable. Sinuses/Orbits: No acute abnormality. Other: Mastoid air cells are clear. ASPECTS [facility] Stroke Program Early CT Score) - Ganglionic level infarction (caudate, lentiform nuclei, internal capsule, insula, M1-M3 cortex): 7 - Supraganglionic infarction (M4-M6 cortex): 3 Total score (0-10 with 10 being normal): 10 IMPRESSION: No acute intracranial hemorrhage. No definite acute infarction. New small area of hypodensity in the left parietal lobe may reflect chronic microvascular ischemic change or age-indeterminate infarct. There is an additional small chronic appearing left parietal infarct. Electronically Signed   By: Guadlupe Spanish M.D.   On: 10/27/2020 10:16    Pending Labs Unresulted Labs (From admission, onward)     Start     Ordered   10/28/20 0500  Protime-INR  Tomorrow morning,   R        10/27/20 1136   10/27/20 1138  Antinuclear Antibodies, IFA  Add-on,   AD        10/27/20 1138            Vitals/Pain Today's Vitals   10/27/20 1501 10/27/20 1630 10/27/20 1843 10/27/20 2030  BP: 94/72 111/88 116/80 101/72  Pulse: 72 66 70 63  Resp: Temp: 98.5 F (36.9 C)   98.1 F (36.7 C)  TempSrc: Oral   Oral  SpO2: 97% 98% 98% 95%  Weight:      Height:      PainSc:        Isolation Precautions No active isolations  Medications Medications  aspirin EC tablet 81 mg (has no administration in time range)  clopidogrel (PLAVIX) tablet 75 mg (has no administration in time range)  heparin injection 5,000 Units (5,000 Units Subcutaneous Given 10/27/20 1406)  sodium chloride flush (NS) 0.9 % injection 3 mL (3 mLs Intravenous Not Given 10/27/20 1153)  0.9 %  sodium chloride infusion ( Intravenous New Bag/Given 10/27/20 1406)  sodium chloride flush (NS) 0.9 % injection 3 mL (3 mLs Intravenous Given 10/27/20 1153)  sodium  chloride flush (NS) 0.9 % injection 3 mL (has no administration in time range)  0.9 %  sodium chloride infusion (has no administration in time range)  acetaminophen (TYLENOL) tablet 650 mg (has no administration in time range)    Or  acetaminophen (TYLENOL) suppository 650 mg (has no administration in time range)  traZODone (DESYREL) tablet 25 mg (has no administration in time range)  senna-docusate (Senokot-S) tablet 1 tablet (has no administration in time range)  bisacodyl (DULCOLAX) EC tablet 5 mg (has no administration in time range)  ondansetron (ZOFRAN) tablet 4 mg (has no administration in time range)    Or  ondansetron (ZOFRAN) injection 4 mg (has no administration in time range)  ipratropium (ATROVENT) nebulizer solution 0.5 mg (has no administration in time range)  levalbuterol (XOPENEX) nebulizer solution 0.63 mg (has no administration in time range)  hydrALAZINE (APRESOLINE) injection 10 mg (has no administration in time range)  oxyCODONE (Oxy IR/ROXICODONE) immediate release tablet 5 mg (has no administration in time range)  atorvastatin (LIPITOR) tablet 80 mg (80 mg Oral Given 10/27/20 1406)  baclofen (LIORESAL) tablet 10 mg (has no administration in time range)  buprenorphine-naloxone (SUBOXONE) 8-2 mg per SL tablet 1 tablet (1 tablet Sublingual Given 10/27/20 1647)  nicotine (NICODERM CQ - dosed in mg/24 hours) patch 14 mg (14 mg Transdermal Patch Applied 10/27/20 1648)  aspirin suppository 300 mg ( Rectal See Alternative 10/27/20 1126)    Or  aspirin tablet 325 mg (325 mg Oral Given 10/27/20 1126)  clopidogrel (PLAVIX) tablet 300 mg (300 mg Oral Given 10/27/20 1125)  iohexol (OMNIPAQUE) 350 MG/ML injection 100 mL (75 mLs Intravenous Contrast Given 10/27/20 1246)    Mobility walks Low fall risk   Focused Assessments    R Recommendations: See Admitting Provider Note  Report given to:   Additional Notes:

## 2020-10-27 NOTE — ED Notes (Signed)
Pt assisted to bathroom via wheelchair and back to bed with minimal assistance 

## 2020-10-27 NOTE — ED Notes (Signed)
Dinner tray given

## 2020-10-27 NOTE — Consult Note (Signed)
[facility] Telemedicine Consult   Requesting Provider: [name] Consult Participants: [facility] nurse Kimmie, bedside nurse Nehemiah Settle, myself, patient Location of the provider: [facility] Location of the patient: [facility]  This consult was provided via telemedicine with 2-way video and audio communication. The patient/family was informed that care would be provided in this way and agreed to receive care in this manner.   Chief Complaint: Right-sided weakness, discoordination and slurred speech  HPI: This is a 55 year old woman with a past medical history significant for hypertension, hyperlipidemia, prior CVA with residual right leg weakness, cocaine abuse now controlled on Suboxone, chronic back pain.  She reports she has been in her normal state of health other than intermittently feeling off balance and walking towards the right for the past 2 days.  She was back to baseline and her normal self at 9:30 PM last night when she went to bed.  On awakening at 4:30 AM, she noted she was weak on the right side and had difficulty handling her coffee cup due to incoordination as well as significantly worse slurred speech.  She was not sure what to do so did not activate EMS initially but eventually arrived to the ED around 10:15 AM, with code stroke activation at 10:22 AM  She notes that she is not on any secondary stroke prevention medications but denies any significant bleeding history or complications from taking aspirin.  She notes she has not been on blood pressure medications for many years but is currently on lisinopril 5 mg that she started last week.  She additionally takes her Suboxone half of a film 4 times daily very regularly and baclofen only as needed, once every few days typically.  Denies any new allergies not already listed in the chart.  Review of systems otherwise negative including negative for any infectious symptoms cardiac symptoms or bleeding issues or other focal  neurological deficits not described above  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   Qualifier: Diagnosis of  By: Zachary George     Chronic low back pain    Cocaine abuse (HCC)    COPD (chronic obstructive pulmonary disease) (HCC)    Heart murmur    HLD (hyperlipidemia)    Hypertension    Stroke (HCC)    x4     LKW: 10/16 at 9:30 PM tpa given?: No, out of the window IR Thrombectomy? No, no evidence of LVO Modified Rankin Scale: 2-Slight disability-UNABLE to perform all activities but does not need assistance Time of teleneurologist evaluation: 10:25 AM  Exam: Vitals:   10/27/20 1020  BP: (!) 151/107  Pulse: 71  Resp: 16  SpO2: 98%    General: Mildly anxious, otherwise no acute distress Pulmonary: breathing comfortably Cardiac: Perfusing extremities well  NIH Stroke scale 1A: Level of Consciousness - 0 1B: Ask Month and Age - 0 1C: 'Blink Eyes' & 'Squeeze Hands' - 0 2: Test Horizontal Extraocular Movements - 0 3: Test Visual Fields - 0 4: Test Facial Palsy - 0 5A: Test Left Arm Motor Drift - 0 5B: Test Right Arm Motor Drift - 1 6A: Test Left Leg Motor Drift - 0 6B: Test Right Leg Motor Drift - 1 7: Test Limb Ataxia - 1 (right upper extremity dysmetria) 8: Test Sensation - 1 (reports some loss of sensation in the right V2 distribution of her face as well as her right arm and right leg) 9: Test Language/Aphasia- 0 10: Test Dysarthria - 1 moderate 11: Test  Extinction/Inattention - 0 NIHSS score: 5   Imaging Reviewed:   Head CT personally reviewed, agree with radiology:  No definite acute infarction. New small area of hypodensity in the left parietal lobe may reflect chronic microvascular ischemic change or age-indeterminate infarct. There is an additional small chronic appearing left parietal infarct.  Labs reviewed in epic and pertinent values follow: Code stroke labs have not been drawn yet, glucose 114, Last prior creatinine 05/2020 1.01  with GFR greater than 60  Assessment: Given history and telemetry evaluation, I do believe the patient has had a small vessel stroke in the setting of uncontrolled small vessel risk factors  Recommendations: Acute stroke based on clinical history and examination, likely corresponding to small hypodensity on head CT though will be more clear on MRI.  Most likely etiology is uncontrolled small vessel risk factors given lack of LVO signs  # Stroke as determined by clinical assessment - Stroke labs TSH, HgbA1c, fasting lipid panel, UA, UDS - MRI brain  - CTA head and neck if no evidence of acute kidney dysfunction when labs are obtained, otherwise please order MRA head without and neck with/without if GFR greater than 30, otherwise please order MRA head and carotid ultrasound if GFR less than 30 - Frequent neuro checks - Echocardiogram with bubble study - Bedside swallow evaluation, - Prophylactic therapy-Antiplatelet med: Aspirin - dose 325mg  PO or 300mg  PR, followed by 81 mg daily (pending bedside swallow, but ordered) - Plavix 300 mg load with 75 mg daily for 21 - 90 day course (pending bedside swallow, but ordered) - Risk factor modification - Telemetry monitoring - Blood pressure goal   - Permissive hypertension to 220/120 for 24 hours in the setting of acute stroke - PT consult, OT consult, Speech consult - Dr. to follow  This patient is receiving care for possible acute neurological changes. There was 45 minutes of care by this provider at the time of service, including time for direct evaluation via telemedicine, review of medical records, imaging studies and discussion of findings with providers, the patient and/or family.  MD-PhD [facility] [phone] If 8pm-8am, please page neurology on call as listed in AMION.

## 2020-10-27 NOTE — ED Notes (Signed)
Per neurologist pt outside window for TNK at 1040.

## 2020-10-27 NOTE — ED Notes (Signed)
OT working with pt at this time.

## 2020-10-27 NOTE — ED Notes (Signed)
MD at bedside. Pt unable to sign MSE form due to right arm weakness
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
## 2020-10-27 NOTE — ED Notes (Signed)
Pt is requesting to use bathroom at this time

## 2020-10-28 ENCOUNTER — Other Ambulatory Visit: Payer: Self-pay

## 2020-10-28 ENCOUNTER — Inpatient Hospital Stay (HOSPITAL_COMMUNITY): Payer: Medicaid Other

## 2020-10-28 DIAGNOSIS — R471 Dysarthria and anarthria: Secondary | ICD-10-CM | POA: Diagnosis not present

## 2020-10-28 DIAGNOSIS — I6389 Other cerebral infarction: Secondary | ICD-10-CM

## 2020-10-28 DIAGNOSIS — G8191 Hemiplegia, unspecified affecting right dominant side: Secondary | ICD-10-CM | POA: Diagnosis not present

## 2020-10-28 DIAGNOSIS — J449 Chronic obstructive pulmonary disease, unspecified: Secondary | ICD-10-CM | POA: Diagnosis not present

## 2020-10-28 LAB — GLUCOSE, CAPILLARY: Glucose-Capillary: 139 mg/dL — ABNORMAL HIGH (ref 70–99)

## 2020-10-28 LAB — PROTIME-INR
INR: 1 (ref 0.8–1.2)
Prothrombin Time: 12.8 seconds (ref 11.4–15.2)

## 2020-10-28 LAB — ECHOCARDIOGRAM COMPLETE
Area-P 1/2: 2.56 cm2
S' Lateral: 3.2 cm
Single Plane A4C EF: 62.4 %

## 2020-10-28 MED ORDER — SODIUM CHLORIDE 0.9 % IV SOLN: INTRAVENOUS | Status: AC

## 2020-10-28 MED ORDER — BUPRENORPHINE HCL-NALOXONE HCL 8-2 MG SL SUBL
0.5000 | SUBLINGUAL_TABLET | SUBLINGUAL | Status: DC
  Administered 2020-10-28 – 2020-10-29 (×4): 0.5 via SUBLINGUAL
  Filled 2020-10-28 (×4): qty 1
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
## 2020-10-28 NOTE — Progress Notes (Signed)
SLP Cancellation Note  Patient Details Name: Alice [name] MRN: [id number] DOB: [birth year]   Cancelled treatment:       Reason Eval/Treat Not Completed: SLP screened, no needs identified, will sign off. Pt passed [facility] Stroke swallow screen and is currently tolerating a regular diet with thin liquids which was initiated after passing [facility]. Secondary to Pt passing [facility] swallow screen SLP will sign off at this time, if there are new concerns or needs for dysphagia please re-order BSE. Thank you,  Alice [name], CCC-SLP Speech Language Pathologist    Alice [name] 10/28/2020, 10:06 AM

## 2020-10-28 NOTE — Plan of Care (Signed)
  Problem: Nutrition: Goal: Adequate nutrition will be maintained 10/28/2020 0231 by Antionette Char, RN Outcome: Progressing 10/28/2020 0155 by Antionette Char, RN Outcome: Progressing 10/28/2020 0155 by Antionette Char, RN Outcome: Progressing

## 2020-10-28 NOTE — Progress Notes (Signed)
  Echocardiogram 2D Echocardiogram has been performed.  Alice [name] 10/28/2020, 1:01 PM

## 2020-10-28 NOTE — Progress Notes (Signed)
Progress note  Patient: Alice [name]                            PCP: Daryll Drown, NP                    DOB: 31-Mar-1965            DOA: 10/27/2020 RCB:[id number]             DOS: 10/28/2020, 10:17 AM  Daryll Drown, NP  Patient coming from:   HOME  I have personally reviewed patient's medical records, in electronic medical records, including:  [facility], and care everywhere.    Chief Complaint:   The patient was seen and examined this morning, still having slurred speech, much improved and symmetrical facial features, improved right arm lower extremity weakness.  Findings of MRI and CT angiogram was discussed with patient detail still complaining of generalized weaknesses and dizziness... Has not ambulated yet.   History of present illness:    Alice [name] is a 55 y.o. female with medical history significant of hypertension, hyperlipidemia, prior CVA?  As for, not on any aspirin or Plavix or statin, history of cocaine abuse, chronic pain syndrome on Suboxone right leg weakness, right arm weakness, mild slurred speech. Last seen normal at 9:30 PM last night, woke up with the findings difficulty with coordination, handling her coffee this morning noticed change in speech therefore presented to ED Patient Denies having: Fever, Chills, Cough, SOB, Chest Pain, Abd pain, N/V/D, headache, dizziness, lightheadedness,  Dysuria, Joint pain, rash, open wounds  ED Course:   Vitals:   10/27/20 2325 10/28/20 0540  BP:  131/84  Pulse:  64  Resp: 16 18  Temp: 98 F (36.7 C) 97.9 F (36.6 C)  SpO2: 98% 95%   Abnormal labs; CMP within normal limits, Urine drug screen negative CT of the head revealed small hypodensity Seen by teleneurology, did not meet the criteria for tPA treatment. Further imaging MRA MRI aspirin Plavix as ordered recommended admission for further work-up   Assessment / Plan:   Principal Problem:   CVA (cerebral vascular accident) (HCC) Active  Problems:   TOBACCO ABUSE   Opiate dependence (HCC)   Severe recurrent major depression without psychotic features (HCC)   Dyslipidemia   Essential hypertension   COPD (chronic obstructive pulmonary disease) (HCC)   Principal Problem:   CVA (cerebral vascular accident) (HCC) -Improved right-sided weakness, still having slurred speech, having dizziness  -Clinically consistent with acute CVA with slurred speech, right-sided weakness  -MRI of the brain reveals: No acute infarction, hemorrhage, or mass.  Two small chronic left parietal infarcts with chronic blood -CT angio head and neck:  Revealed: no acute large vessel occlusion. Aortic atherosclerosis, mild. Atherosclerotic calcification at both carotid bifurcations, mild. No stenosis or irregularity.  No abnormal intracranial vascular finding.  -Stroke work-up thus far negative with exception of old stroke per MRI, labs within normal limits - -pending -2D echocardiogram >>  -Neurochecks--proved right-sided weakness, slurred speech -Initiated patient on aspirin and Plavix,  -High-dose statin -LDL 107,  -PT/OT/speech consultation for evaluation recommendation -With holding BP meds, allowing permissive hypertension  Pending inpatient neurology evaluation, recommendations  Active Problems:   TOBACCO ABUSE -She has been counseled, NicoDerm will be provided    Opiate dependence (HCC) -Suboxone, as needed baclofen     Major depression without psychotic features (HCC)-currently stable not on any medication  Dyslipidemia -not on any home medication, -  LDL 107, goal less than 70, HDL 50, triglyceride 87, total cholesterol 174 -initiating high-dose statins    Essential hypertension -withholding home medication lisinopril    COPD (chronic obstructive pulmonary disease) (HCC) -signs of exacerbation, not O2 dependent Counseled regarding smoking cessation    Cultures:  -none  Antimicrobial: -none   Consults called:  Telestroke/inpatient neurology -------------------------------------------------------------------------------------------------------------------------------------------- DVT prophylaxis: SCD/Compression stockings and Heparin SQ Code Status:   Code Status: Full Code   Admission status: Admitted as Inpatient, continue to have symptoms of stroke, needing close neurochecks, neurology evaluation..., Slurred speech, dizziness  Family Communication:  none at bedside  (The above findings and plan of care has been discussed with patient in detail, the patient expressed understanding and agreement of above plan)  --------------------------------------------------------------------------------------------------------------------------------------------------  Disposition Plan: From home-likely home in 24 hours Status is: Inpatient  Remains inpatient appropriate because: Acute stroke, needing stroke work-up,    Meds:   aspirin EC  81 mg Oral Daily   atorvastatin  80 mg Oral Daily   buprenorphine-naloxone  1 tablet Sublingual Daily   clopidogrel  75 mg Oral Daily   heparin  5,000 Units Subcutaneous Q8H   nicotine  14 mg Transdermal Daily   sodium chloride flush  3 mL Intravenous Q12H   sodium chloride flush  3 mL Intravenous Q12H    PRN MEDs: sodium chloride, acetaminophen **OR** acetaminophen, baclofen, bisacodyl, ipratropium, levalbuterol, ondansetron **OR** ondansetron (ZOFRAN) IV, oxyCODONE, senna-docusate, sodium chloride flush, traZODone  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 She has a 20.00 pack-year smoking history. She has never used smokeless tobacco. She reports that she does not  currently use alcohol. She reports that she does not currently use drugs after having used the following drugs: Heroin, "Crack" cocaine, Oxycodone, and Cocaine.   Physical Exam:      Physical Exam:   General:  Alert, oriented, cooperative, no distress; slurred speech Cognition intact  HEENT:  Normocephalic, PERRL, otherwise with in Normal limits   Neuro:  CNII-XII intact. , normal motor and sensation, reflexes intact   Lungs:   Clear to auscultation BL, Respirations unlabored, no wheezes / crackles  Cardio:    S1/S2, RRR, No murmure, No Rubs or Gallops   Abdomen:   Soft, non-tender, bowel sounds active all four quadrants,  no guarding or peritoneal signs.  Muscular skeletal:  Limited exam - in bed, able to move all 4 extremities, right upper and lower extremity 4 out of 5 weakness, left strength 5 out of 5 2+ pulses,  symmetric, No pitting edema  Skin:  Dry, warm to touch, negative for any Rashes,  Wounds: Please see nursing documentation              Labs on admission:    I have personally reviewed following labs and imaging studies  CBC: Recent Labs  Lab 10/27/20 1054  WBC 5.2  NEUTROABS 3.8  HGB 13.5  HCT 41.3  MCV 91.4  PLT 199   Basic Metabolic Panel: Recent Labs  Lab 10/27/20 1054  NA 137  K 3.8  CL  103  CO2 30  GLUCOSE 113*  BUN 17  CREATININE 0.72  CALCIUM 9.2   GFR: Estimated Creatinine Clearance: 94 mL/min (by C-G formula based on SCr of 0.72 mg/dL). Liver Function Tests: Recent Labs  Lab 10/27/20 1054  AST 22  ALT 13  ALKPHOS 89  BILITOT 0.5  PROT 8.1  ALBUMIN 4.4   No results for input(s): LIPASE, AMYLASE in the last 168 hours. No results for input(s): AMMONIA in the last 168 hours. Coagulation Profile: Recent Labs  Lab 10/27/20 1054 10/28/20 0549  INR 1.0 1.0   Cardiac Enzymes: No results for input(s): CKTOTAL, CKMB, CKMBINDEX, TROPONINI in the last 168 hours. BNP (last 3 results) No results for input(s): PROBNP in the  last 8760 hours. HbA1C: Recent Labs    10/27/20 1048  HGBA1C 5.5   CBG: Recent Labs  Lab 10/27/20 1023 10/28/20 0727  GLUCAP 114* 139*      Component Value Date/Time   COLORURINE YELLOW 10/27/2020 1114   APPEARANCEUR CLEAR 10/27/2020 1114   LABSPEC 1.020 10/27/2020 1114   PHURINE 5.0 10/27/2020 1114   GLUCOSEU >=500 (A) 10/27/2020 1114   HGBUR NEGATIVE 10/27/2020 1114   BILIRUBINUR NEGATIVE 10/27/2020 1114   BILIRUBINUR neg 10/30/2013 1427   KETONESUR NEGATIVE 10/27/2020 1114   PROTEINUR NEGATIVE 10/27/2020 1114   UROBILINOGEN negative 10/30/2013 1427   UROBILINOGEN 1.0 07/31/2011 1059   NITRITE NEGATIVE 10/27/2020 1114   LEUKOCYTESUR NEGATIVE 10/27/2020 1114     Radiologic Exams on Admission:   CT ANGIO HEAD NECK W WO CM  Result Date: 10/27/2020 CLINICAL DATA:  Neurological deficit, acute, stroke suspected. Dizziness over the last 3 weeks. Right-sided weakness. EXAM: CT ANGIOGRAPHY HEAD AND NECK TECHNIQUE: Multidetector CT imaging of the head and neck was performed using the standard protocol during bolus administration of intravenous contrast. Multiplanar CT image reconstructions and MIPs were obtained to evaluate the vascular anatomy. Carotid stenosis measurements (when applicable) are obtained utilizing NASCET criteria, using the distal internal carotid diameter as the denominator. CONTRAST:  86mL OMNIPAQUE IOHEXOL 350 MG/ML SOLN COMPARISON:  MRI CT studies earlier same day. FINDINGS: CTA NECK FINDINGS Aortic arch: Aortic atherosclerosis. No aneurysm. Branching pattern is normal without origin stenosis. Right carotid system: Common carotid artery widely patent to the bifurcation. Carotid bifurcation shows minimal calcified plaque but no stenosis. Cervical ICA appears normal. Left carotid system: Common carotid artery widely patent to the bifurcation. Carotid bifurcation shows minimal atherosclerotic plaque but no stenosis. Cervical ICA widely patent. Vertebral arteries: Both  vertebral artery origins are widely patent. Both vertebral arteries appear normal through the cervical region to the foramen magnum. Skeleton: Chronic cervical spondylosis particularly at C6-7. There may be some old post traumatic deformity at this level. Moderate bony canal narrowing. Old healed superior endplate compression fractures at T4 and T6. Other neck: No mass or lymphadenopathy. 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 Upper chest: 5 mm subpleural pulmonary nodule anteriorly in the right lung image 12. 5 mm subpleural pulmonary nodule posteriorly in the right upper lobe image 49. These are unchanged since 2018 and therefore benign. Review of the MIP images confirms the above findings CTA HEAD FINDINGS Anterior circulation: Both internal carotid arteries are widely patent through the skull base and siphon regions. No siphon stenosis. The anterior and middle cerebral vessels are normal without branch vessel occlusion, proximal stenosis, aneurysm or vascular malformation. Posterior circulation: Both vertebral arteries are widely patent to the basilar. No basilar stenosis. Posterior circulation branch  vessels are normal. Venous sinuses: Patent and normal. Anatomic variants: None significant. Review of the MIP images confirms the above findings IMPRESSION: No acute large vessel occlusion. Aortic atherosclerosis, mild. Atherosclerotic calcification at both carotid bifurcations, mild. No stenosis or particular irregularity. No abnormal intracranial vascular finding. Electronically Signed   By: Paulina Fusi M.D.   On: 10/27/2020 13:00   MR BRAIN WO CONTRAST  Result Date: 10/27/2020 CLINICAL DATA:  Neuro deficit, acute, stroke suspected EXAM: MRI HEAD WITHOUT CONTRAST TECHNIQUE: Multiplanar, multiecho pulse sequences of the brain and surrounding structures were obtained without intravenous contrast. COMPARISON:  None. FINDINGS: Brain: There is no acute infarction or  intracranial hemorrhage. There is no intracranial mass, mass effect, or edema. There is no hydrocephalus or extra-axial fluid collection. There are a few scattered foci of cortical/subcortical susceptibility likely reflecting chronic blood products. Two of these areas are associated with left parietal infarcts that were seen on the CT. A few additional small foci of T2 hyperintensity in the supratentorial white matter are nonspecific may reflect minor chronic microvascular ischemic changes. Ventricles and sulci are normal in size and configuration. Vascular: Major vessel flow voids at the skull base are preserved. Skull and upper cervical spine: Normal marrow signal is preserved. Sinuses/Orbits: Paranasal sinuses are aerated. Orbits are unremarkable. Other: Sella is unremarkable.  Mastoid air cells are clear. IMPRESSION: No acute infarction, hemorrhage, or mass. Two small chronic left parietal infarcts with chronic blood products. Few additional foci of chronic cortical/subcortical blood products are present. Minor chronic microvascular ischemic changes. Electronically Signed   By: Guadlupe Spanish M.D.   On: 10/27/2020 13:10   CT HEAD CODE STROKE WO CONTRAST  Result Date: 10/27/2020 CLINICAL DATA:  Code stroke.  Right-sided weakness EXAM: CT HEAD WITHOUT CONTRAST TECHNIQUE: Contiguous axial images were obtained from the base of the skull through the vertex without intravenous contrast. COMPARISON:  2018 FINDINGS: Brain: There is no acute intracranial hemorrhage, mass effect or edema. Small area of hypodensity in the left parietal lobe (series 2, image 23) likely involving white matter but possibly also cortex. Ventricles and sulci are normal in size and configuration. There is a small chronic left parietal infarct. Vascular: No hyperdense vessel. Skull: Unremarkable. Sinuses/Orbits: No acute abnormality. Other: Mastoid air cells are clear. ASPECTS [facility] Stroke Program Early CT Score) - Ganglionic level  infarction (caudate, lentiform nuclei, internal capsule, insula, M1-M3 cortex): 7 - Supraganglionic infarction (M4-M6 cortex): 3 Total score (0-10 with 10 being normal): 10 IMPRESSION: No acute intracranial hemorrhage. No definite acute infarction. New small area of hypodensity in the left parietal lobe may reflect chronic microvascular ischemic change or age-indeterminate infarct. There is an additional small chronic appearing left parietal infarct. Electronically Signed   By: Guadlupe Spanish M.D.   On: 10/27/2020 10:16    EKG:   Independently reviewed.  Orders placed or performed during the hospital encounter of 10/27/20   ED EKG   ED EKG   EKG 12-Lead   EKG   ---------------------------------------------------------------------------------------------------------------------------------  Time spent: > than  55  Min.   SIGNED: Kendell Bane, MD, FHM. [facility],  Pager (Please use [email or web address] to page to text)  If 7PM-7AM, please contact night-coverage [email or web address],  10/28/2020, 10:17 AM

## 2020-10-28 NOTE — Plan of Care (Signed)
  Problem: Nutrition: Goal: Adequate nutrition will be maintained Outcome: Progressing   

## 2020-10-29 DIAGNOSIS — G40909 Epilepsy, unspecified, not intractable, without status epilepticus: Secondary | ICD-10-CM

## 2020-10-29 LAB — ANTINUCLEAR ANTIBODIES, IFA: ANA Ab, IFA: NEGATIVE

## 2020-10-29 MED ORDER — ALBUTEROL SULFATE HFA 108 (90 BASE) MCG/ACT IN AERS: 2.0000 | INHALATION_SPRAY | Freq: Four times a day (QID) | RESPIRATORY_TRACT | 2 refills | Status: DC | PRN

## 2020-10-29 MED ORDER — NICOTINE 14 MG/24HR TD PT24: 14.0000 mg | MEDICATED_PATCH | Freq: Every day | TRANSDERMAL | 0 refills | Status: DC

## 2020-10-29 MED ORDER — LEVETIRACETAM IN NACL 1000 MG/100ML IV SOLN
1000.0000 mg | Freq: Once | INTRAVENOUS | Status: AC
  Administered 2020-10-29: 1000 mg via INTRAVENOUS
  Filled 2020-10-29: qty 100

## 2020-10-29 MED ORDER — ASPIRIN 81 MG PO TBEC: 81.0000 mg | DELAYED_RELEASE_TABLET | Freq: Every day | ORAL | 11 refills | Status: DC

## 2020-10-29 MED ORDER — UMECLIDINIUM-VILANTEROL 62.5-25 MCG/ACT IN AEPB: 1.0000 | INHALATION_SPRAY | Freq: Every day | RESPIRATORY_TRACT | 3 refills | Status: DC

## 2020-10-29 MED ORDER — ATORVASTATIN CALCIUM 40 MG PO TABS: 40.0000 mg | ORAL_TABLET | Freq: Every day | ORAL | 11 refills | Status: DC

## 2020-10-29 MED ORDER — LEVETIRACETAM 500 MG PO TABS: 500.0000 mg | ORAL_TABLET | Freq: Two times a day (BID) | ORAL | 5 refills | Status: DC

## 2020-10-29 NOTE — Discharge Instructions (Signed)
1)Please Take Aspirin 81 mg daily for Stroke Prevention 2)Please take Keppra 500mg  twice daily to Prevent Seizures 3) smoking cessation/abstinence from tobacco strongly advised to reduce your risk for strokes 4)Please follow-up with Neurologist Dr. -- Phone: [phone], Address: [address], [place], [place] in 4 weeks for recheck and reevaluation.  Please call to make appointment with him  5)Per [facility] statutes, patients with seizures are not allowed to drive until they have been seizure-free for six months.   Use caution when using heavy equipment or power tools. Avoid working on ladders or at heights. Take showers instead of baths, Do not lock yourself in a room alone (i.e. bathroom).. Ensure the water temperature is not too high on the home water heater. Do not go swimming alone. When caring for infants or small children, sit down when holding, feeding, or changing them to minimize risk of injury to the child in the event you have a seizure.   Do not lock yourself in a room alone (i.e. bathroom).  Maintain good sleep hygiene. Avoid alcohol.    If patient has another seizure, call 911 and bring them back to the ED if: A.  The seizure lasts longer than 5 minutes.      B.  The patient doesn't wake shortly after the seizure or has new problems such as difficulty seeing, speaking or moving following the seizure C.  The patient was injured during the seizure D.  The patient has a temperature over 102 F (39C) E.  The patient vomited during the seizure and now is having trouble breathing

## 2020-10-29 NOTE — Progress Notes (Signed)
Nsg Discharge Note  Admit Date:  10/27/2020 Discharge date: 10/29/2020   [name] to be D/C'd Home per MD order.  AVS completed.  Copy for chart, and copy for patient signed, and dated. Patient/caregiver able to verbalize understanding.  Discharge Medication: Allergies as of 10/29/2020       Reactions   Benadryl [diphenhydramine Hcl] Hives   Codeine Rash   Tetracycline Itching, Rash        Medication List     TAKE these medications    albuterol 108 (90 Base) MCG/ACT inhaler Commonly known as: VENTOLIN HFA Inhale 2 puffs into the lungs every 6 (six) hours as needed for wheezing or shortness of breath.   aspirin 81 MG EC tablet Take 1 tablet (81 mg total) by mouth daily with breakfast. For Stroke Prevention   atorvastatin 40 MG tablet Commonly known as: Lipitor Take 1 tablet (40 mg total) by mouth daily.   baclofen 10 MG tablet Commonly known as: LIORESAL TAKE ONE TABLET THREE TIMES DAILY AS NEEDED   levETIRAcetam 500 MG tablet Commonly known as: Keppra Take 1 tablet (500 mg total) by mouth 2 (two) times daily.   lisinopril 5 MG tablet Commonly known as: ZESTRIL Take 1 tablet (5 mg total) by mouth daily.   nicotine 14 mg/24hr patch Commonly known as: NICODERM CQ - dosed in mg/24 hours Place 1 patch (14 mg total) onto the skin daily. Start taking on: October 30, 2020   Suboxone 8-2 MG Film Generic drug: Buprenorphine HCl-Naloxone HCl SMARTSIG:0.5 Strip(s) By Mouth 4 Times Daily   umeclidinium-vilanterol 62.5-25 MCG/ACT Aepb Commonly known as: ANORO ELLIPTA Inhale 1 puff into the lungs daily. For COPD        Discharge Assessment: Vitals:   10/28/20 2034 10/29/20 0803  BP: (!) 143/104 (!) 158/98  Pulse: 64 66  Resp: 20 20  Temp: 97.7 F (36.5 C)   SpO2: 98% 100%   Skin clean, dry and intact without evidence of skin break down, no evidence of skin tears noted. IV catheter discontinued intact. Site without signs and symptoms of complications - no  redness or edema noted at insertion site, patient denies c/o pain - only slight tenderness at site.  Dressing with slight pressure applied.  D/c Instructions-Education: Discharge instructions given to patient/family with verbalized understanding. D/c education completed with patient/family including follow up instructions, medication list, d/c activities limitations if indicated, with other d/c instructions as indicated by MD - patient able to verbalize understanding, all questions fully answered. Patient instructed to return to ED, call 911, or call MD for any changes in condition.  Patient escorted via WC, and D/C home via private auto.  Demetrio Lapping, LPN 96/22/2979 89:21 PM

## 2020-10-29 NOTE — TOC Transition Note (Signed)
Transition of Care [facility]) - CM/SW Discharge Note   Patient Details  Name: Alice [name] MRN: [id number] Date of Birth: [birth year]  [facility] CM/SW Contact:  Leitha Bleak, RN Phone Number: 10/29/2020, 10:26 AM   Clinical Narrative:   Patient admitted for CVA. PT is recommending, Patient is agreeable, OUTPT - ordered, and added to AVS for [facility] location.    Final next level of care: OP Rehab Barriers to Discharge: Barriers Resolved   Patient Goals and CMS Choice Patient states their goals for this hospitalization and ongoing recovery are:: to go home, CMS Medicare.gov Compare Post Acute Care list provided to:: Patient    Discharge Placement          Patient and family notified of of transfer: 10/29/20  Discharge Plan and Services    Readmission Risk Interventions Readmission Risk Prevention Plan 10/29/2020  Transportation Screening Complete  Home Care Screening Complete  Medication Review (RN CM) Complete  Some recent data might be hidden

## 2020-10-29 NOTE — Discharge Summary (Signed)
Alice [name], is a 55 y.o. female  DOB June 27, 1965  MRN [id number].  Admission date:  10/27/2020  Admitting Physician  Kendell Bane, MD  Discharge Date:  10/29/2020   Primary MD  Daryll Drown, NP  Recommendations for primary care physician for things to follow:   1)Please Take Aspirin 81 mg daily for Stroke Prevention  2)Please take Keppra 500 mg twice daily to Prevent Seizures  3)Smoking cessation/abstinence from tobacco strongly advised to reduce your risk for strokes  4)Please follow-up with Neurologist Dr. Beryle Beams-- Phone: [phone], Address: [address], [place], [place] [address] in 4 weeks for recheck and reevaluation.  Please call to make appointment with him  5)Per [facility] statutes, patients with seizures are not allowed to drive until they have been seizure-free for six months.   Use caution when using heavy equipment or power tools. Avoid working on ladders or at heights. Take showers instead of baths, Do not lock yourself in a room alone (i.e. bathroom).. Ensure the water temperature is not too high on the home water heater. Do not go swimming alone. When caring for infants or small children, sit down when holding, feeding, or changing them to minimize risk of injury to the child in the event you have a seizure.   Do not lock yourself in a room alone (i.e. bathroom).  Maintain good sleep hygiene. Avoid alcohol.    If patient has another seizure, call 911 and bring them back to the ED if: A.  The seizure lasts longer than 5 minutes.      B.  The patient doesn't wake shortly after the seizure or has new problems such as difficulty seeing, speaking or moving following the seizure C.  The patient was injured during the seizure D.  The patient has a temperature over 102 F (39C) E.  The patient vomited during the seizure and now is having trouble  breathing   Admission Diagnosis  CVA (cerebral vascular accident) (HCC) [I63.9] Cocaine use [F14.90] Cerebrovascular accident (CVA), unspecified mechanism (HCC) [I63.9]   Discharge Diagnosis  CVA (cerebral vascular accident) (HCC) [I63.9] Cocaine use [F14.90] Cerebrovascular accident (CVA), unspecified mechanism (HCC) [I63.9]    Principal Problem:   Seizure Disorder -Abnormal EEG 09/09/2016/Postictal Todd's paralysis note in October 2022 Active Problems:   TOBACCO ABUSE   Opiate dependence (HCC)   Severe recurrent major depression without psychotic features (HCC)   Dyslipidemia   Essential hypertension   COPD (chronic obstructive pulmonary disease) (HCC)      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  BACK SURGERY     hardware in back   cyst removed from ovary     FRACTURE SURGERY     LUMBAR SPINE   removal of BB in finger  2008   SALIVARY GLAND SURGERY     tumor reomoved from stomach      HPI  from the history and physical done on the day of admission:   Alice [name] is a 55 y.o. female with medical history significant of hypertension, hyperlipidemia, prior CVA?  As for, not on any aspirin or Plavix or statin, history of cocaine abuse, chronic pain syndrome on Suboxone right leg weakness, right arm weakness, mild slurred speech. Last seen normal at 9:30 PM last night, woke up with the findings difficulty with coordination, handling her coffee this morning noticed change in speech therefore presented to ED Patient Denies having: Fever, Chills, Cough, SOB, Chest Pain, Abd pain, N/V/D, headache, dizziness, lightheadedness,  Dysuria, Joint  pain, rash, open wounds   ED Course:      Vitals:    10/27/20 1020  BP: (!) 151/107  Pulse: 71  Resp: 16  SpO2: 98%    Abnormal labs; CMP within normal limits, Urine drug screen negative CT of the head revealed small hypodensity Seen by teleneurology, did not meet the criteria for tPA treatment. Further imaging MRA MRI aspirin Plavix as ordered recommended admission for further work-up   Review of Systems: As per HPI, otherwise 10 point review of systems were negative.      Hospital Course:    1)Postictal Todd's paralysis. ----right-sided weakness and speech concerns improving, physical therapy and speech therapy eval appreciated -Outpatient PT advised -MRI of the brain and CT angio of the head and neck without acute findings -Patient reports episodes of what sounds like possible absence seizure's -Patient believes she had a seizure this am, states she was sitting on side of bed applying lotion and suddenly felt funny and became stiff. does not recall what happened next but now still feels odd and "like I lost time".  -I called and discussed concerns with neurologist Dr. Gerilyn Pilgrim -Patient had EEG from 09/09/2016 consistent with seizure type activity  in the left temporal region -As per Dr. Gerilyn Pilgrim treat empirically with Keppra and then follow-up with Dr. Gerilyn Pilgrim as outpatient in 4 weeks -Please see discharge instructions -She was loaded with IV Keppra here in the hospital be discharged home with Keppra 500 twice daily  2)Poly- substance abuse--- Brain MRI with Several microhemorrhage bilaterally which I think is due to complication chronic cocaine abuse less likely amyloid angiopathy. -Patient with history of cocaine, tobacco and opiate abuse  3)H/o CVA---Remote small left cortical frontal infarct:  This is relevant as the potential substrate for seizure and the right-sided weakness.  -Stroke imaging work-up negative this admission as noted above #1 -Take Aspirin and Lipitor  for secondary stroke prophylaxis, smoking cessation advised  4) COPD--- smoking cessation strongly advised, bronchodilators as prescribed  Discharge Condition: stable  Follow UP   Follow-up Information     [facility] Follow up.   Specialty: Rehabilitation Why: This will call to schedule your first appointment. Contact information: [address] [employer] [id number] mc [address] [place] [address] [phone]                 Consults obtained - Neurology  Diet and Activity recommendation:  As advised  Discharge Instructions    Discharge Instructions     Ambulatory referral to Physical Therapy   Complete by: As directed  Ambulatory referral to Physical Therapy   Complete by: As directed    Call MD for:  difficulty breathing, headache or visual disturbances   Complete by: As directed    Call MD for:  persistant dizziness or light-headedness   Complete by: As directed    Call MD for:  persistant nausea and vomiting   Complete by: As directed    Call MD for:  temperature >100.4   Complete by: As directed    Diet - low sodium heart healthy   Complete by: As directed    Discharge instructions   Complete by: As directed    1)Please Take Aspirin 81 mg daily for Stroke Prevention 2)Please take Keppra  twice daily to Prevent Seizures 3) smoking cessation/abstinence from tobacco strongly advised to reduce your risk for strokes 4)Please follow-up with Neurologist Dr. Beryle Beams-- Phone: [phone], Address: [address], [place], [place] [address] in 4 weeks for recheck and reevaluation.  Please call to make appointment with him  5)Per [facility] statutes, patients with seizures are not allowed to drive until they have been seizure-free for six months.   Use caution when using heavy equipment or power tools. Avoid working on ladders or at heights. Take showers instead of baths, Do not lock yourself in a room  alone (i.e. bathroom).. Ensure the water temperature is not too high on the home water heater. Do not go swimming alone. When caring for infants or small children, sit down when holding, feeding, or changing them to minimize risk of injury to the child in the event you have a seizure.   Do not lock yourself in a room alone (i.e. bathroom).  Maintain good sleep hygiene. Avoid alcohol.    If patient has another seizure, call 911 and bring them back to the ED if: A.  The seizure lasts longer than 5 minutes.      B.  The patient doesn't wake shortly after the seizure or has new problems such as difficulty seeing, speaking or moving following the seizure C.  The patient was injured during the seizure D.  The patient has a temperature over 102 F (39C) E.  The patient vomited during the seizure and now is having trouble breathing   Increase activity slowly   Complete by: As directed          Discharge Medications     Allergies as of 10/29/2020       Reactions   Benadryl [diphenhydramine Hcl] Hives   Codeine Rash   Tetracycline Itching, Rash        Medication List     TAKE these medications    albuterol 108 (90 Base) MCG/ACT inhaler Commonly known as: VENTOLIN HFA Inhale 2 puffs into the lungs every 6 (six) hours as needed for wheezing or shortness of breath.   aspirin 81 MG EC tablet Take 1 tablet (81 mg total) by mouth daily with breakfast. For Stroke Prevention   atorvastatin 40 MG tablet Commonly known as: Lipitor Take 1 tablet (40 mg total) by mouth daily.   baclofen 10 MG tablet Commonly known as: LIORESAL TAKE ONE TABLET THREE TIMES DAILY AS NEEDED   levETIRAcetam 500 MG tablet Commonly known as: Keppra Take 1 tablet (500 mg total) by mouth 2 (two) times daily.   lisinopril 5 MG tablet Commonly known as: ZESTRIL Take 1 tablet (5 mg total) by mouth daily.   nicotine 14 mg/24hr patch Commonly known as: NICODERM CQ - dosed in mg/24  hours Place 1 patch (14 mg  total) onto the skin daily. Start taking on: October 30, 2020   Suboxone 8-2 MG Film Generic drug: Buprenorphine HCl-Naloxone HCl SMARTSIG:0.5 Strip(s) By Mouth 4 Times Daily   umeclidinium-vilanterol 62.5-25 MCG/ACT Aepb Commonly known as: ANORO ELLIPTA Inhale 1 puff into the lungs daily. For COPD        Major procedures and Radiology Reports - PLEASE review detailed and final reports for all details, in brief -   CT ANGIO HEAD NECK W WO CM  Result Date: 10/27/2020 CLINICAL DATA:  Neurological deficit, acute, stroke suspected. Dizziness over the last 3 weeks. Right-sided weakness. EXAM: CT ANGIOGRAPHY HEAD AND NECK TECHNIQUE: Multidetector CT imaging of the head and neck was performed using the standard protocol during bolus administration of intravenous contrast. Multiplanar CT image reconstructions and MIPs were obtained to evaluate the vascular anatomy. Carotid stenosis measurements (when applicable) are obtained utilizing NASCET criteria, using the distal internal carotid diameter as the denominator. CONTRAST:  33mL OMNIPAQUE IOHEXOL 350 MG/ML SOLN COMPARISON:  MRI CT studies earlier same day. FINDINGS: CTA NECK FINDINGS Aortic arch: Aortic atherosclerosis. No aneurysm. Branching pattern is normal without origin stenosis. Right carotid system: Common carotid artery widely patent to the bifurcation. Carotid bifurcation shows minimal calcified plaque but no stenosis. Cervical ICA appears normal. Left carotid system: Common carotid artery widely patent to the bifurcation. Carotid bifurcation shows minimal atherosclerotic plaque but no stenosis. Cervical ICA widely patent. Vertebral arteries: Both vertebral artery origins are widely patent. Both vertebral arteries appear normal through the cervical region to the foramen magnum. Skeleton: Chronic cervical spondylosis particularly at C6-7. There may be some old post traumatic deformity at this level. Moderate bony canal narrowing. Old healed  superior endplate compression fractures at T4 and T6. Other neck: No mass or lymphadenopathy. 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 Upper chest: 5 mm subpleural pulmonary nodule anteriorly in the right lung image 12. 5 mm subpleural pulmonary nodule posteriorly in the right upper lobe image 49. These are unchanged since 2018 and therefore benign. Review of the MIP images confirms the above findings CTA HEAD FINDINGS Anterior circulation: Both internal carotid arteries are widely patent through the skull base and siphon regions. No siphon stenosis. The anterior and middle cerebral vessels are normal without branch vessel occlusion, proximal stenosis, aneurysm or vascular malformation. Posterior circulation: Both vertebral arteries are widely patent to the basilar. No basilar stenosis. Posterior circulation branch vessels are normal. Venous sinuses: Patent and normal. Anatomic variants: None significant. Review of the MIP images confirms the above findings IMPRESSION: No acute large vessel occlusion. Aortic atherosclerosis, mild. Atherosclerotic calcification at both carotid bifurcations, mild. No stenosis or particular irregularity. No abnormal intracranial vascular finding. Electronically Signed   By: Paulina Fusi M.D.   On: 10/27/2020 13:00   MR BRAIN WO CONTRAST  Result Date: 10/27/2020 CLINICAL DATA:  Neuro deficit, acute, stroke suspected EXAM: MRI HEAD WITHOUT CONTRAST TECHNIQUE: Multiplanar, multiecho pulse sequences of the brain and surrounding structures were obtained without intravenous contrast. COMPARISON:  None. FINDINGS: Brain: There is no acute infarction or intracranial hemorrhage. There is no intracranial mass, mass effect, or edema. There is no hydrocephalus or extra-axial fluid collection. There are a few scattered foci of cortical/subcortical susceptibility likely reflecting chronic blood products. Two of these areas are associated with left parietal  infarcts that were seen on the CT. A few additional small foci of T2 hyperintensity in the supratentorial white matter are  nonspecific may reflect minor chronic microvascular ischemic changes. Ventricles and sulci are normal in size and configuration. Vascular: Major vessel flow voids at the skull base are preserved. Skull and upper cervical spine: Normal marrow signal is preserved. Sinuses/Orbits: Paranasal sinuses are aerated. Orbits are unremarkable. Other: Sella is unremarkable.  Mastoid air cells are clear. IMPRESSION: No acute infarction, hemorrhage, or mass. Two small chronic left parietal infarcts with chronic blood products. Few additional foci of chronic cortical/subcortical blood products are present. Minor chronic microvascular ischemic changes. Electronically Signed   By: Guadlupe Spanish M.D.   On: 10/27/2020 13:10   ECHOCARDIOGRAM COMPLETE  Result Date: 10/28/2020    ECHOCARDIOGRAM REPORT   Patient Name:   Alice [name] Date of Exam: 10/28/2020 Medical Rec #:  [id number]   Height:       70.0 in Accession #:    [id number]  Weight:       186.0 lb Date of Birth:  December 01, 1965   BSA:          2.024 m Patient Age:    55 years    BP:           159/95 mmHg Patient Gender: F           HR:           71 bpm. [facility] Procedure: 2D Echo, Cardiac Doppler and Color Doppler Indications:    CVA  History:        Patient has prior history of Echocardiogram examinations, most                 recent 09/09/2016. Stroke and COPD; Risk Factors:Hypertension,                 Dyslipidemia and Current Smoker. Cocaine abuse.  Sonographer:    Lavenia Atlas RDCS Referring Phys: [phone] SEYED A SHAHMEHDI IMPRESSIONS  1. Left ventricular ejection fraction, by estimation, is 60 to 65%. The left ventricle has normal function. The left ventricle has no regional wall motion abnormalities. There is mild left ventricular hypertrophy. Left ventricular diastolic parameters are indeterminate.  2. Right ventricular systolic  function is normal. The right ventricular size is normal. There is normal pulmonary artery systolic pressure. The estimated right ventricular systolic pressure is 34.4 mmHg.  3. The mitral valve is grossly normal. Mild mitral valve regurgitation.  4. The aortic valve is tricuspid. Aortic valve regurgitation is not visualized.  5. The inferior vena cava is dilated in size with >50% respiratory variability, suggesting right atrial pressure of 8 mmHg. Comparison(s): No prior Echocardiogram. FINDINGS  Left Ventricle: Left ventricular ejection fraction, by estimation, is 60 to 65%. The left ventricle has normal function. The left ventricle has no regional wall motion abnormalities. The left ventricular internal cavity size was normal in size. There is  mild left ventricular hypertrophy. Left ventricular diastolic parameters are indeterminate. Right Ventricle: The right ventricular size is normal. No increase in right ventricular wall thickness. Right ventricular systolic function is normal. There is normal pulmonary artery systolic pressure. The tricuspid regurgitant velocity is 2.57 m/s, and  with an assumed right atrial pressure of 8 mmHg, the estimated right ventricular systolic pressure is 34.4 mmHg. Left Atrium: Left atrial size was normal in size. Right Atrium: Right atrial size was normal in size. Pericardium: There is no evidence of pericardial effusion. Mitral Valve: The mitral valve is grossly normal. There is mild calcification of the mitral valve leaflet(s). Mild mitral valve regurgitation, with eccentric posteriorly directed  jet. Tricuspid Valve: The tricuspid valve is grossly normal. Tricuspid valve regurgitation is trivial. Aortic Valve: The aortic valve is tricuspid. Aortic valve regurgitation is not visualized. Pulmonic Valve: The pulmonic valve was grossly normal. Pulmonic valve regurgitation is trivial. Aorta: The aortic root is normal in size and structure. Venous: The inferior vena cava is dilated in  size with greater than 50% respiratory variability, suggesting right atrial pressure of 8 mmHg. IAS/Shunts: No atrial level shunt detected by color flow Doppler.  LEFT VENTRICLE PLAX 2D LVIDd:         5.40 cm      Diastology LVIDs:         3.20 cm      LV e' medial:    6.85 cm/s LV PW:         1.20 cm      LV E/e' medial:  16.8 LV IVS:        1.20 cm      LV e' lateral:   14.90 cm/s LVOT diam:     2.10 cm      LV E/e' lateral: 7.7 LV SV:         74 LV SV Index:   36 LVOT Area:     3.46 cm  LV Volumes (MOD) LV vol d, MOD A4C: 123.0 ml LV vol s, MOD A4C: 46.2 ml LV SV MOD A4C:     123.0 ml RIGHT VENTRICLE RV Basal diam:  2.50 cm RV S prime:     9.19 cm/s TAPSE (M-mode): 2.4 cm LEFT ATRIUM             Index        RIGHT ATRIUM           Index LA diam:        4.60 cm 2.27 cm/m   RA Area:     16.15 cm LA Vol (A2C):   55.5 ml 27.42 ml/m  RA Volume:   40.55 ml  20.04 ml/m LA Vol (A4C):   37.2 ml 18.38 ml/m LA Biplane Vol: 47.6 ml 23.52 ml/m  AORTIC VALVE LVOT Vmax:   109.00 cm/s LVOT Vmean:  74.300 cm/s LVOT VTI:    0.213 m  AORTA Ao Root diam: 2.90 cm MITRAL VALVE                TRICUSPID VALVE MV Area (PHT): 2.56 cm     TR Peak grad:   26.4 mmHg MV Decel Time: 296 msec     TR Vmax:        257.00 cm/s MV E velocity: 115.00 cm/s MV A velocity: 109.00 cm/s  SHUNTS MV E/A ratio:  1.06         Systemic VTI:  0.21 m                             Systemic Diam: 2.10 cm Nona Dell MD Electronically signed by Nona Dell MD Signature Date/Time: 10/28/2020/2:23:27 PM    Final    CT HEAD CODE STROKE WO CONTRAST  Result Date: 10/27/2020 CLINICAL DATA:  Code stroke.  Right-sided weakness EXAM: CT HEAD WITHOUT CONTRAST TECHNIQUE: Contiguous axial images were obtained from the base of the skull through the vertex without intravenous contrast. COMPARISON:  2018 FINDINGS: Brain: There is no acute intracranial hemorrhage, mass effect or edema. Small area of hypodensity in the left parietal lobe (series 2, image 23)  likely involving white matter but possibly also  cortex. Ventricles and sulci are normal in size and configuration. There is a small chronic left parietal infarct. Vascular: No hyperdense vessel. Skull: Unremarkable. Sinuses/Orbits: No acute abnormality. Other: Mastoid air cells are clear. ASPECTS [place] Stroke Program Early CT Score) - Ganglionic level infarction (caudate, lentiform nuclei, internal capsule, insula, M1-M3 cortex): 7 - Supraganglionic infarction (M4-M6 cortex): 3 Total score (0-10 with 10 being normal): 10 IMPRESSION: No acute intracranial hemorrhage. No definite acute infarction. New small area of hypodensity in the left parietal lobe may reflect chronic microvascular ischemic change or age-indeterminate infarct. There is an additional small chronic appearing left parietal infarct. Electronically Signed   By: Guadlupe Spanish M.D.   On: 10/27/2020 10:16    Micro Results   Recent Results (from the past 240 hour(s))  Resp Panel by RT-PCR (Flu A&B, Covid) Nasopharyngeal Swab     Status: None   Collection Time: 10/27/20 11:15 AM   Specimen: Nasopharyngeal Swab; Nasopharyngeal(NP) swabs in vial transport medium  Result Value Ref Range Status   SARS Coronavirus 2 by RT PCR NEGATIVE NEGATIVE Final    Comment: (NOTE) SARS-CoV-2 target nucleic acids are NOT DETECTED.  The SARS-CoV-2 RNA is generally detectable in upper respiratory specimens during the acute phase of infection. The lowest concentration of SARS-CoV-2 viral copies this assay can detect is 138 copies/mL. A negative result does not preclude SARS-Cov-2 infection and should not be used as the sole basis for treatment or other patient management decisions. A negative result may occur with  improper specimen collection/handling, submission of specimen other than nasopharyngeal swab, presence of viral mutation(s) within the areas targeted by this assay, and inadequate number of viral copies(<138 copies/mL). A negative result must  be combined with clinical observations, patient history, and epidemiological information. The expected result is Negative.  Fact Sheet for Patients:  BloggerCourse.com  Fact Sheet for Healthcare Providers:  SeriousBroker.it  This test is no t yet approved or cleared by the [place] FDA and  has been authorized for detection and/or diagnosis of SARS-CoV-2 by FDA under an Emergency Use Authorization (EUA). This EUA will remain  in effect (meaning this test can be used) for the duration of the COVID-19 declaration under Section 564(b)(1) of the Act, 21 U.S.C.section 360bbb-3(b)(1), unless the authorization is terminated  or revoked sooner.       Influenza A by PCR NEGATIVE NEGATIVE Final   Influenza B by PCR NEGATIVE NEGATIVE Final    Comment: (NOTE) The Xpert Xpress SARS-CoV-2/FLU/RSV plus assay is intended as an aid in the diagnosis of influenza from Nasopharyngeal swab specimens and should not be used as a sole basis for treatment. Nasal washings and aspirates are unacceptable for Xpert Xpress SARS-CoV-2/FLU/RSV testing.  Fact Sheet for Patients: BloggerCourse.com  Fact Sheet for Healthcare Providers: SeriousBroker.it  This test is not yet approved or cleared by the [place] FDA and has been authorized for detection and/or diagnosis of SARS-CoV-2 by FDA under an Emergency Use Authorization (EUA). This EUA will remain in effect (meaning this test can be used) for the duration of the COVID-19 declaration under Section 564(b)(1) of the Act, 21 U.S.C. section 360bbb-3(b)(1), unless the authorization is terminated or revoked.  Performed at [facility], [address], [place], [place] [address]     Today   Subjective    Alice [name] today has no new concerns  -Right-sided neuro symptoms and speech concerns continues to improve          Patient has been seen and  examined  prior to discharge   Objective   Blood pressure (!) 158/98, pulse 66, temperature 97.7 F (36.5 C), temperature source Oral, resp. rate 20, height  (1.778 m), weight 88.8 kg, last menstrual period 07/30/2011, SpO2 100 %.   Intake/Output Summary (Last 24 hours) at 10/29/2020 1138 Last data filed at 10/29/2020 0900 Gross per 24 hour  Intake 1384.72 ml  Output --  Net 1384.72 ml    Exam Gen:- Awake Alert, no acute distress  HEENT:- [place].AT, No sclera icterus Neck-Supple Neck,No JVD,.  Lungs-  CTAB , good air movement bilaterally  CV- S1, S2 normal, regular Abd-  +ve B.Sounds, Abd Soft, No tenderness,    Extremity/Skin:- No  edema,   good pulses Psych-affect is appropriate, oriented x3 Neuro-right-sided weakness and speech concerns improving, no new focal deficits, no tremors  -   Data Review   CBC w Diff:  Lab Results  Component Value Date   WBC 5.2 10/27/2020   HGB 13.5 10/27/2020   HCT 41.3 10/27/2020   PLT 199 10/27/2020   LYMPHOPCT 11 10/27/2020   MONOPCT 8 10/27/2020   EOSPCT 7 10/27/2020   BASOPCT 1 10/27/2020    CMP:  Lab Results  Component Value Date   NA 137 10/27/2020   K 3.8 10/27/2020   CL 103 10/27/2020   CO2 30 10/27/2020   BUN 17 10/27/2020   CREATININE 0.72 10/27/2020   PROT 8.1 10/27/2020   ALBUMIN 4.4 10/27/2020   BILITOT 0.5 10/27/2020   ALKPHOS 89 10/27/2020   AST 22 10/27/2020   ALT 13 10/27/2020   Total Discharge time is about 33 minutes  Shon Hale M.D on 10/29/2020 at 11:38 AM  Go to [email or web address] -  for contact info  [facility] - Office  [phone]

## 2020-10-30 ENCOUNTER — Telehealth: Payer: Self-pay

## 2020-10-30 NOTE — Telephone Encounter (Signed)
Transition Care Management Follow-up Telephone Call Date of discharge and from where: 10/29/2020 from [facility] How have you been since you were released from the hospital? Pt states that she is feeling okay, just trying to rest.  Any questions or concerns? No  Items Reviewed: Did the pt receive and understand the discharge instructions provided? Yes  Medications obtained and verified? Yes  Other? No  Any new allergies since your discharge? No  Dietary orders reviewed? Yes Do you have support at home? Yes   Functional Questionnaire: (I = Independent and D = Dependent) ADLs: I - Her fiance and an aid comes by and is home to help her.   Bathing/Dressing- I  Meal Prep- I  Eating- I  Maintaining continence- I  Transferring/Ambulation- I  Managing Meds- I   Follow up appointments reviewed:  PCP Hospital f/u appt confirmed? Yes  Scheduled to see Lynnell Chad, NP on 11/05/2020 @ 2:15pm Specialist Hospital f/u appt confirmed? Yes  Scheduled to see Outpatient Rehab Candi Leash, PT on 11/05/2020 @ 1:00pm..Scheduled to see Outpatient Rehab Candi Leash, PT on 11/05/2020 @ 1:00pm. Are transportation arrangements needed? No  If their condition worsens, is the pt aware to call PCP or go to the Emergency Dept.? Yes Was the patient provided with contact information for the PCP's office or ED? Yes Was to pt encouraged to call back with questions or concerns? Yes

## 2020-10-31 DIAGNOSIS — Z8673 Personal history of transient ischemic attack (TIA), and cerebral infarction without residual deficits: Secondary | ICD-10-CM | POA: Diagnosis not present

## 2020-11-01 DIAGNOSIS — Z8673 Personal history of transient ischemic attack (TIA), and cerebral infarction without residual deficits: Secondary | ICD-10-CM | POA: Diagnosis not present

## 2020-11-02 DIAGNOSIS — Z8673 Personal history of transient ischemic attack (TIA), and cerebral infarction without residual deficits: Secondary | ICD-10-CM | POA: Diagnosis not present

## 2020-11-03 DIAGNOSIS — Z8673 Personal history of transient ischemic attack (TIA), and cerebral infarction without residual deficits: Secondary | ICD-10-CM | POA: Diagnosis not present

## 2020-11-04 DIAGNOSIS — Z8673 Personal history of transient ischemic attack (TIA), and cerebral infarction without residual deficits: Secondary | ICD-10-CM | POA: Diagnosis not present

## 2020-11-05 ENCOUNTER — Ambulatory Visit: Payer: Medicaid Other | Admitting: Nurse Practitioner

## 2020-11-05 ENCOUNTER — Ambulatory Visit: Payer: Medicaid Other

## 2020-11-05 DIAGNOSIS — Z8673 Personal history of transient ischemic attack (TIA), and cerebral infarction without residual deficits: Secondary | ICD-10-CM | POA: Diagnosis not present

## 2020-11-06 DIAGNOSIS — Z8673 Personal history of transient ischemic attack (TIA), and cerebral infarction without residual deficits: Secondary | ICD-10-CM | POA: Diagnosis not present

## 2020-11-07 DIAGNOSIS — Z8673 Personal history of transient ischemic attack (TIA), and cerebral infarction without residual deficits: Secondary | ICD-10-CM | POA: Diagnosis not present

## 2020-11-08 DIAGNOSIS — Z8673 Personal history of transient ischemic attack (TIA), and cerebral infarction without residual deficits: Secondary | ICD-10-CM | POA: Diagnosis not present

## 2020-11-09 DIAGNOSIS — Z8673 Personal history of transient ischemic attack (TIA), and cerebral infarction without residual deficits: Secondary | ICD-10-CM | POA: Diagnosis not present

## 2020-11-10 DIAGNOSIS — Z8673 Personal history of transient ischemic attack (TIA), and cerebral infarction without residual deficits: Secondary | ICD-10-CM | POA: Diagnosis not present

## 2020-11-11 DIAGNOSIS — Z8673 Personal history of transient ischemic attack (TIA), and cerebral infarction without residual deficits: Secondary | ICD-10-CM | POA: Diagnosis not present

## 2020-11-12 DIAGNOSIS — Z8673 Personal history of transient ischemic attack (TIA), and cerebral infarction without residual deficits: Secondary | ICD-10-CM | POA: Diagnosis not present

## 2020-11-13 DIAGNOSIS — Z8673 Personal history of transient ischemic attack (TIA), and cerebral infarction without residual deficits: Secondary | ICD-10-CM | POA: Diagnosis not present

## 2020-11-14 DIAGNOSIS — Z8673 Personal history of transient ischemic attack (TIA), and cerebral infarction without residual deficits: Secondary | ICD-10-CM | POA: Diagnosis not present

## 2020-11-15 DIAGNOSIS — Z8673 Personal history of transient ischemic attack (TIA), and cerebral infarction without residual deficits: Secondary | ICD-10-CM | POA: Diagnosis not present

## 2020-11-16 DIAGNOSIS — Z8673 Personal history of transient ischemic attack (TIA), and cerebral infarction without residual deficits: Secondary | ICD-10-CM | POA: Diagnosis not present

## 2020-11-17 DIAGNOSIS — Z8673 Personal history of transient ischemic attack (TIA), and cerebral infarction without residual deficits: Secondary | ICD-10-CM | POA: Diagnosis not present

## 2020-11-18 DIAGNOSIS — Z8673 Personal history of transient ischemic attack (TIA), and cerebral infarction without residual deficits: Secondary | ICD-10-CM | POA: Diagnosis not present

## 2020-11-19 DIAGNOSIS — Z8673 Personal history of transient ischemic attack (TIA), and cerebral infarction without residual deficits: Secondary | ICD-10-CM | POA: Diagnosis not present

## 2020-11-20 DIAGNOSIS — Z8673 Personal history of transient ischemic attack (TIA), and cerebral infarction without residual deficits: Secondary | ICD-10-CM | POA: Diagnosis not present

## 2020-11-22 DIAGNOSIS — Z8673 Personal history of transient ischemic attack (TIA), and cerebral infarction without residual deficits: Secondary | ICD-10-CM | POA: Diagnosis not present

## 2020-11-23 DIAGNOSIS — Z8673 Personal history of transient ischemic attack (TIA), and cerebral infarction without residual deficits: Secondary | ICD-10-CM | POA: Diagnosis not present

## 2020-11-24 DIAGNOSIS — Z8673 Personal history of transient ischemic attack (TIA), and cerebral infarction without residual deficits: Secondary | ICD-10-CM | POA: Diagnosis not present

## 2020-11-25 DIAGNOSIS — Z8673 Personal history of transient ischemic attack (TIA), and cerebral infarction without residual deficits: Secondary | ICD-10-CM | POA: Diagnosis not present

## 2020-11-26 DIAGNOSIS — Z8673 Personal history of transient ischemic attack (TIA), and cerebral infarction without residual deficits: Secondary | ICD-10-CM | POA: Diagnosis not present

## 2020-11-27 DIAGNOSIS — Z8673 Personal history of transient ischemic attack (TIA), and cerebral infarction without residual deficits: Secondary | ICD-10-CM | POA: Diagnosis not present

## 2020-11-28 DIAGNOSIS — Z8673 Personal history of transient ischemic attack (TIA), and cerebral infarction without residual deficits: Secondary | ICD-10-CM | POA: Diagnosis not present

## 2020-11-29 DIAGNOSIS — Z8673 Personal history of transient ischemic attack (TIA), and cerebral infarction without residual deficits: Secondary | ICD-10-CM | POA: Diagnosis not present

## 2020-11-30 DIAGNOSIS — Z8673 Personal history of transient ischemic attack (TIA), and cerebral infarction without residual deficits: Secondary | ICD-10-CM | POA: Diagnosis not present

## 2020-12-01 DIAGNOSIS — Z8673 Personal history of transient ischemic attack (TIA), and cerebral infarction without residual deficits: Secondary | ICD-10-CM | POA: Diagnosis not present

## 2020-12-02 DIAGNOSIS — Z8673 Personal history of transient ischemic attack (TIA), and cerebral infarction without residual deficits: Secondary | ICD-10-CM | POA: Diagnosis not present

## 2020-12-03 DIAGNOSIS — Z8673 Personal history of transient ischemic attack (TIA), and cerebral infarction without residual deficits: Secondary | ICD-10-CM | POA: Diagnosis not present

## 2020-12-05 DIAGNOSIS — Z8673 Personal history of transient ischemic attack (TIA), and cerebral infarction without residual deficits: Secondary | ICD-10-CM | POA: Diagnosis not present

## 2020-12-06 DIAGNOSIS — Z8673 Personal history of transient ischemic attack (TIA), and cerebral infarction without residual deficits: Secondary | ICD-10-CM | POA: Diagnosis not present

## 2020-12-07 DIAGNOSIS — Z8673 Personal history of transient ischemic attack (TIA), and cerebral infarction without residual deficits: Secondary | ICD-10-CM | POA: Diagnosis not present

## 2020-12-08 DIAGNOSIS — Z8673 Personal history of transient ischemic attack (TIA), and cerebral infarction without residual deficits: Secondary | ICD-10-CM | POA: Diagnosis not present

## 2020-12-09 DIAGNOSIS — Z8673 Personal history of transient ischemic attack (TIA), and cerebral infarction without residual deficits: Secondary | ICD-10-CM | POA: Diagnosis not present

## 2020-12-10 DIAGNOSIS — Z8673 Personal history of transient ischemic attack (TIA), and cerebral infarction without residual deficits: Secondary | ICD-10-CM | POA: Diagnosis not present

## 2020-12-11 DIAGNOSIS — Z8673 Personal history of transient ischemic attack (TIA), and cerebral infarction without residual deficits: Secondary | ICD-10-CM | POA: Diagnosis not present

## 2020-12-12 ENCOUNTER — Ambulatory Visit: Payer: Medicaid Other | Admitting: Nurse Practitioner

## 2020-12-12 ENCOUNTER — Encounter: Payer: Self-pay | Admitting: Nurse Practitioner

## 2020-12-12 VITALS — BP 138/100 | HR 93 | Temp 96.9°F | Resp 20 | Ht 70.0 in | Wt 195.0 lb

## 2020-12-12 DIAGNOSIS — I10 Essential (primary) hypertension: Secondary | ICD-10-CM

## 2020-12-12 DIAGNOSIS — Z8673 Personal history of transient ischemic attack (TIA), and cerebral infarction without residual deficits: Secondary | ICD-10-CM | POA: Diagnosis not present

## 2020-12-12 DIAGNOSIS — F17200 Nicotine dependence, unspecified, uncomplicated: Secondary | ICD-10-CM | POA: Diagnosis not present

## 2020-12-12 DIAGNOSIS — Z09 Encounter for follow-up examination after completed treatment for conditions other than malignant neoplasm: Secondary | ICD-10-CM | POA: Diagnosis not present

## 2020-12-12 MED ORDER — LISINOPRIL 10 MG PO TABS
10.0000 mg | ORAL_TABLET | Freq: Every day | ORAL | 1 refills | Status: DC
Start: 2020-12-12 — End: 2021-06-01

## 2020-12-12 NOTE — Assessment & Plan Note (Signed)
Completed hospital discharge instructions with medication reconciliation.  Patient verbalized understanding.  Patient's biggest challenge at this point is noncompliant with medication administration.  She is not taking her blood pressure as prescribed and eats regular diet.  Patient is also requesting additional home health hours due to her declining ability to do activities of daily living. Like  Combing and cutting  her hair and  nails, fixing breakfast and other house hold chores. Patient is reporting more difficulty standing duet to pain in lower back as well

## 2020-12-12 NOTE — Patient Instructions (Signed)
Smoking Tobacco Information, Adult °Smoking tobacco can be harmful to your health. Tobacco contains a poisonous (toxic), colorless chemical called nicotine. Nicotine is addictive. It changes the brain and can make it hard to stop smoking. Tobacco also has other toxic chemicals that can hurt your body and raise your risk of many cancers. °How can smoking tobacco affect me? °Smoking tobacco puts you at risk for: °Cancer. Smoking is most commonly associated with lung cancer, but can also lead to cancer in other parts of the body. °Chronic obstructive pulmonary disease (COPD). This is a long-term lung condition that makes it hard to breathe. It also gets worse over time. °High blood pressure (hypertension), heart disease, stroke, or heart attack. °Lung infections, such as pneumonia. °Cataracts. This is when the lenses in the eyes become clouded. °Digestive problems. This may include peptic ulcers, heartburn, and gastroesophageal reflux disease (GERD). °Oral health problems, such as gum disease and tooth loss. °Loss of taste and smell. °Smoking can affect your appearance by causing: °Wrinkles. °Yellow or stained teeth, fingers, and fingernails. °Smoking tobacco can also affect your social life, because: °It may be challenging to find places to smoke when away from home. Many workplaces, restaurants, hotels, and public places are tobacco-free. °Smoking is expensive. This is due to the cost of tobacco and the long-term costs of treating health problems from smoking. °Secondhand smoke may affect those around you. Secondhand smoke can cause lung cancer, breathing problems, and heart disease. Children of smokers have a higher risk for: °Sudden infant death syndrome (SIDS). °Ear infections. °Lung infections. °If you currently smoke tobacco, quitting now can help you: °Lead a longer and healthier life. °Look, smell, breathe, and feel better over time. °Save money. °Protect others from the harms of secondhand smoke. °What  actions can I take to prevent health problems? °Quit smoking ° °Do not start smoking. Quit if you already do. °Make a plan to quit smoking and commit to it. Look for programs to help you and ask your health care provider for recommendations and ideas. °Set a date and write down all the reasons you want to quit. °Let your friends and family know you are quitting so they can help and support you. Consider finding friends who also want to quit. It can be easier to quit with someone else, so that you can support each other. °Talk with your health care provider about using nicotine replacement medicines to help you quit, such as gum, lozenges, patches, sprays, or pills. °Do not replace cigarette smoking with electronic cigarettes, which are commonly called e-cigarettes. The safety of e-cigarettes is not known, and some may contain harmful chemicals. °If you try to quit but return to smoking, stay positive. It is common to slip up when you first quit, so take it one day at a time. °Be prepared for cravings. When you feel the urge to smoke, chew gum or suck on hard candy. °Lifestyle °Stay busy and take care of your body. °Drink enough fluid to keep your urine pale yellow. °Get plenty of exercise and eat a healthy diet. This can help prevent weight gain after quitting. °Monitor your eating habits. Quitting smoking can cause you to have a larger appetite than when you smoke. °Find ways to relax. Go out with friends or family to a movie or a restaurant where people do not smoke. °Ask your health care provider about having regular tests (screenings) to check for cancer. This may include blood tests, imaging tests, and other tests. °Find ways to manage   your stress, such as meditation, yoga, or exercise. °Where to find support °To get support to quit smoking, consider: °Asking your health care provider for more information and resources. °Taking classes to learn more about quitting smoking. °Looking for local organizations that  offer resources about quitting smoking. °Joining a support group for people who want to quit smoking in your local community. °Calling the smokefree.gov counselor helpline: 1-800-Quit-Now (1-800-784-8669) °Where to find more information °You may find more information about quitting smoking from: °HelpGuide.org: www.helpguide.org °Smokefree.gov: smokefree.gov °American Lung Association: www.lung.org °Contact a health care provider if you: °Have problems breathing. °Notice that your lips, nose, or fingers turn blue. °Have chest pain. °Are coughing up blood. °Feel faint or you pass out. °Have other health changes that cause you to worry. °Summary °Smoking tobacco can negatively affect your health, the health of those around you, your finances, and your social life. °Do not start smoking. Quit if you already do. If you need help quitting, ask your health care provider. °Think about joining a support group for people who want to quit smoking in your local community. There are many effective programs that will help you to quit this behavior. °This information is not intended to replace advice given to you by your health care provider. Make sure you discuss any questions you have with your health care provider. °Document Revised: 09/01/2020 Document Reviewed: 11/20/2019 °Elsevier Patient Education © 2022 Elsevier Inc. °Hypertension, Adult °Hypertension is another name for high blood pressure. High blood pressure forces your heart to work harder to pump blood. This can cause problems over time. °There are two numbers in a blood pressure reading. There is a top number (systolic) over a bottom number (diastolic). It is best to have a blood pressure that is below 120/80. Healthy choices can help lower your blood pressure, or you may need medicine to help lower it. °What are the causes? °The cause of this condition is not known. Some conditions may be related to high blood pressure. °What increases the risk? °Smoking. °Having  type 2 diabetes mellitus, high cholesterol, or both. °Not getting enough exercise or physical activity. °Being overweight. °Having too much fat, sugar, calories, or salt (sodium) in your diet. °Drinking too much alcohol. °Having long-term (chronic) kidney disease. °Having a family history of high blood pressure. °Age. Risk increases with age. °Race. You may be at higher risk if you are African American. °Gender. Men are at higher risk than women before age 45. After age 65, women are at higher risk than men. °Having obstructive sleep apnea. °Stress. °What are the signs or symptoms? °High blood pressure may not cause symptoms. Very high blood pressure (hypertensive crisis) may cause: °Headache. °Feelings of worry or nervousness (anxiety). °Shortness of breath. °Nosebleed. °A feeling of being sick to your stomach (nausea). °Throwing up (vomiting). °Changes in how you see. °Very bad chest pain. °Seizures. °How is this treated? °This condition is treated by making healthy lifestyle changes, such as: °Eating healthy foods. °Exercising more. °Drinking less alcohol. °Your health care provider may prescribe medicine if lifestyle changes are not enough to get your blood pressure under control, and if: °Your top number is above 130. °Your bottom number is above 80. °Your personal target blood pressure may vary. °Follow these instructions at home: °Eating and drinking ° °If told, follow the DASH eating plan. To follow this plan: °Fill one half of your plate at each meal with fruits and vegetables. °Fill one fourth of your plate at each meal with whole   grains. Whole grains include whole-wheat pasta, brown rice, and whole-grain bread. °Eat or drink low-fat dairy products, such as skim milk or low-fat yogurt. °Fill one fourth of your plate at each meal with low-fat (lean) proteins. Low-fat proteins include fish, chicken without skin, eggs, beans, and tofu. °Avoid fatty meat, cured and processed meat, or chicken with skin. °Avoid  pre-made or processed food. °Eat less than 1,500 mg of salt each day. °Do not drink alcohol if: °Your doctor tells you not to drink. °You are pregnant, may be pregnant, or are planning to become pregnant. °If you drink alcohol: °Limit how much you use to: °0-1 drink a day for women. °0-2 drinks a day for men. °Be aware of how much alcohol is in your drink. In the [place] one drink equals one 12 oz bottle of beer (355 mL), one 5 oz glass of wine (148 mL), or one 1½ oz glass of hard liquor (44 mL). °Lifestyle ° °Work with your doctor to stay at a healthy weight or to lose weight. Ask your doctor what the best weight is for you. °Get at least 30 minutes of exercise most days of the week. This may include walking, swimming, or biking. °Get at least 30 minutes of exercise that strengthens your muscles (resistance exercise) at least 3 days a week. This may include lifting weights or doing Pilates. °Do not use any products that contain nicotine or tobacco, such as cigarettes, e-cigarettes, and chewing tobacco. If you need help quitting, ask your doctor. °Check your blood pressure at home as told by your doctor. °Keep all follow-up visits as told by your doctor. This is important. °Medicines °Take over-the-counter and prescription medicines only as told by your doctor. Follow directions carefully. °Do not skip doses of blood pressure medicine. The medicine does not work as well if you skip doses. Skipping doses also puts you at risk for problems. °Ask your doctor about side effects or reactions to medicines that you should watch for. °Contact a doctor if you: °Think you are having a reaction to the medicine you are taking. °Have headaches that keep coming back (recurring). °Feel dizzy. °Have swelling in your ankles. °Have trouble with your vision. °Get help right away if you: °Get a very bad headache. °Start to feel mixed up (confused). °Feel weak or numb. °Feel faint. °Have very bad pain in your: °Chest. °Belly  (abdomen). °Throw up more than once. °Have trouble breathing. °Summary °Hypertension is another name for high blood pressure. °High blood pressure forces your heart to work harder to pump blood. °For most people, a normal blood pressure is less than 120/80. °Making healthy choices can help lower blood pressure. If your blood pressure does not get lower with healthy choices, you may need to take medicine. °This information is not intended to replace advice given to you by your health care provider. Make sure you discuss any questions you have with your health care provider. °Document Revised: 09/07/2017 Document Reviewed: 09/07/2017 °Elsevier Patient Education © 2022 Elsevier Inc. ° °

## 2020-12-12 NOTE — Assessment & Plan Note (Signed)
Hypertension not well controlled.  Patient not currently taking medication as prescribed and cannot remember if she takes her blood pressure log at home.  Based on current blood pressure value I changed lisinopril from 5 mg to 10 mg tablet by mouth.  Advised patient to take a log every day, call in numbers of values in a week.  Follow-up in 2 weeks.
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
## 2020-12-13 DIAGNOSIS — Z8673 Personal history of transient ischemic attack (TIA), and cerebral infarction without residual deficits: Secondary | ICD-10-CM | POA: Diagnosis not present

## 2020-12-14 DIAGNOSIS — Z8673 Personal history of transient ischemic attack (TIA), and cerebral infarction without residual deficits: Secondary | ICD-10-CM | POA: Diagnosis not present

## 2020-12-15 DIAGNOSIS — Z8673 Personal history of transient ischemic attack (TIA), and cerebral infarction without residual deficits: Secondary | ICD-10-CM | POA: Diagnosis not present

## 2020-12-16 DIAGNOSIS — Z8673 Personal history of transient ischemic attack (TIA), and cerebral infarction without residual deficits: Secondary | ICD-10-CM | POA: Diagnosis not present

## 2020-12-17 DIAGNOSIS — Z8673 Personal history of transient ischemic attack (TIA), and cerebral infarction without residual deficits: Secondary | ICD-10-CM | POA: Diagnosis not present

## 2020-12-18 DIAGNOSIS — Z8673 Personal history of transient ischemic attack (TIA), and cerebral infarction without residual deficits: Secondary | ICD-10-CM | POA: Diagnosis not present

## 2020-12-19 DIAGNOSIS — Z8673 Personal history of transient ischemic attack (TIA), and cerebral infarction without residual deficits: Secondary | ICD-10-CM | POA: Diagnosis not present

## 2020-12-20 DIAGNOSIS — Z8673 Personal history of transient ischemic attack (TIA), and cerebral infarction without residual deficits: Secondary | ICD-10-CM | POA: Diagnosis not present

## 2020-12-21 DIAGNOSIS — Z8673 Personal history of transient ischemic attack (TIA), and cerebral infarction without residual deficits: Secondary | ICD-10-CM | POA: Diagnosis not present

## 2020-12-22 DIAGNOSIS — Z8673 Personal history of transient ischemic attack (TIA), and cerebral infarction without residual deficits: Secondary | ICD-10-CM | POA: Diagnosis not present

## 2020-12-23 DIAGNOSIS — Z8673 Personal history of transient ischemic attack (TIA), and cerebral infarction without residual deficits: Secondary | ICD-10-CM | POA: Diagnosis not present

## 2020-12-24 DIAGNOSIS — Z8673 Personal history of transient ischemic attack (TIA), and cerebral infarction without residual deficits: Secondary | ICD-10-CM | POA: Diagnosis not present

## 2020-12-25 DIAGNOSIS — Z8673 Personal history of transient ischemic attack (TIA), and cerebral infarction without residual deficits: Secondary | ICD-10-CM | POA: Diagnosis not present

## 2020-12-26 DIAGNOSIS — Z8673 Personal history of transient ischemic attack (TIA), and cerebral infarction without residual deficits: Secondary | ICD-10-CM | POA: Diagnosis not present

## 2020-12-27 DIAGNOSIS — Z8673 Personal history of transient ischemic attack (TIA), and cerebral infarction without residual deficits: Secondary | ICD-10-CM | POA: Diagnosis not present

## 2020-12-28 DIAGNOSIS — Z8673 Personal history of transient ischemic attack (TIA), and cerebral infarction without residual deficits: Secondary | ICD-10-CM | POA: Diagnosis not present

## 2020-12-29 DIAGNOSIS — Z8673 Personal history of transient ischemic attack (TIA), and cerebral infarction without residual deficits: Secondary | ICD-10-CM | POA: Diagnosis not present

## 2020-12-30 DIAGNOSIS — Z8673 Personal history of transient ischemic attack (TIA), and cerebral infarction without residual deficits: Secondary | ICD-10-CM | POA: Diagnosis not present

## 2020-12-31 DIAGNOSIS — Z8673 Personal history of transient ischemic attack (TIA), and cerebral infarction without residual deficits: Secondary | ICD-10-CM | POA: Diagnosis not present

## 2021-01-01 DIAGNOSIS — Z8673 Personal history of transient ischemic attack (TIA), and cerebral infarction without residual deficits: Secondary | ICD-10-CM | POA: Diagnosis not present

## 2021-01-02 DIAGNOSIS — Z8673 Personal history of transient ischemic attack (TIA), and cerebral infarction without residual deficits: Secondary | ICD-10-CM | POA: Diagnosis not present

## 2021-01-03 DIAGNOSIS — Z8673 Personal history of transient ischemic attack (TIA), and cerebral infarction without residual deficits: Secondary | ICD-10-CM | POA: Diagnosis not present

## 2021-01-04 DIAGNOSIS — Z8673 Personal history of transient ischemic attack (TIA), and cerebral infarction without residual deficits: Secondary | ICD-10-CM | POA: Diagnosis not present

## 2021-01-05 DIAGNOSIS — Z8673 Personal history of transient ischemic attack (TIA), and cerebral infarction without residual deficits: Secondary | ICD-10-CM | POA: Diagnosis not present

## 2021-01-06 DIAGNOSIS — Z8673 Personal history of transient ischemic attack (TIA), and cerebral infarction without residual deficits: Secondary | ICD-10-CM | POA: Diagnosis not present

## 2021-01-07 DIAGNOSIS — Z8673 Personal history of transient ischemic attack (TIA), and cerebral infarction without residual deficits: Secondary | ICD-10-CM | POA: Diagnosis not present

## 2021-01-08 DIAGNOSIS — Z8673 Personal history of transient ischemic attack (TIA), and cerebral infarction without residual deficits: Secondary | ICD-10-CM | POA: Diagnosis not present

## 2021-01-09 DIAGNOSIS — Z8673 Personal history of transient ischemic attack (TIA), and cerebral infarction without residual deficits: Secondary | ICD-10-CM | POA: Diagnosis not present

## 2021-01-10 DIAGNOSIS — Z8673 Personal history of transient ischemic attack (TIA), and cerebral infarction without residual deficits: Secondary | ICD-10-CM | POA: Diagnosis not present

## 2021-01-12 DIAGNOSIS — Z8673 Personal history of transient ischemic attack (TIA), and cerebral infarction without residual deficits: Secondary | ICD-10-CM | POA: Diagnosis not present

## 2021-01-13 DIAGNOSIS — Z8673 Personal history of transient ischemic attack (TIA), and cerebral infarction without residual deficits: Secondary | ICD-10-CM | POA: Diagnosis not present

## 2021-01-14 DIAGNOSIS — Z8673 Personal history of transient ischemic attack (TIA), and cerebral infarction without residual deficits: Secondary | ICD-10-CM | POA: Diagnosis not present

## 2021-01-15 DIAGNOSIS — Z8673 Personal history of transient ischemic attack (TIA), and cerebral infarction without residual deficits: Secondary | ICD-10-CM | POA: Diagnosis not present

## 2021-01-16 DIAGNOSIS — Z8673 Personal history of transient ischemic attack (TIA), and cerebral infarction without residual deficits: Secondary | ICD-10-CM | POA: Diagnosis not present

## 2021-01-17 DIAGNOSIS — Z8673 Personal history of transient ischemic attack (TIA), and cerebral infarction without residual deficits: Secondary | ICD-10-CM | POA: Diagnosis not present

## 2021-01-18 DIAGNOSIS — Z8673 Personal history of transient ischemic attack (TIA), and cerebral infarction without residual deficits: Secondary | ICD-10-CM | POA: Diagnosis not present

## 2021-01-19 DIAGNOSIS — Z8673 Personal history of transient ischemic attack (TIA), and cerebral infarction without residual deficits: Secondary | ICD-10-CM | POA: Diagnosis not present

## 2021-01-20 DIAGNOSIS — Z8673 Personal history of transient ischemic attack (TIA), and cerebral infarction without residual deficits: Secondary | ICD-10-CM | POA: Diagnosis not present

## 2021-01-21 ENCOUNTER — Ambulatory Visit: Payer: Medicaid Other | Admitting: Nurse Practitioner

## 2021-01-21 DIAGNOSIS — Z8673 Personal history of transient ischemic attack (TIA), and cerebral infarction without residual deficits: Secondary | ICD-10-CM | POA: Diagnosis not present

## 2021-01-22 DIAGNOSIS — Z8673 Personal history of transient ischemic attack (TIA), and cerebral infarction without residual deficits: Secondary | ICD-10-CM | POA: Diagnosis not present

## 2021-01-23 DIAGNOSIS — Z8673 Personal history of transient ischemic attack (TIA), and cerebral infarction without residual deficits: Secondary | ICD-10-CM | POA: Diagnosis not present

## 2021-01-24 DIAGNOSIS — Z8673 Personal history of transient ischemic attack (TIA), and cerebral infarction without residual deficits: Secondary | ICD-10-CM | POA: Diagnosis not present

## 2021-01-25 DIAGNOSIS — Z8673 Personal history of transient ischemic attack (TIA), and cerebral infarction without residual deficits: Secondary | ICD-10-CM | POA: Diagnosis not present

## 2021-01-27 DIAGNOSIS — Z8673 Personal history of transient ischemic attack (TIA), and cerebral infarction without residual deficits: Secondary | ICD-10-CM | POA: Diagnosis not present

## 2021-01-28 DIAGNOSIS — Z8673 Personal history of transient ischemic attack (TIA), and cerebral infarction without residual deficits: Secondary | ICD-10-CM | POA: Diagnosis not present

## 2021-01-29 DIAGNOSIS — Z8673 Personal history of transient ischemic attack (TIA), and cerebral infarction without residual deficits: Secondary | ICD-10-CM | POA: Diagnosis not present

## 2021-01-30 DIAGNOSIS — Z8673 Personal history of transient ischemic attack (TIA), and cerebral infarction without residual deficits: Secondary | ICD-10-CM | POA: Diagnosis not present

## 2021-01-31 DIAGNOSIS — Z8673 Personal history of transient ischemic attack (TIA), and cerebral infarction without residual deficits: Secondary | ICD-10-CM | POA: Diagnosis not present

## 2021-02-01 DIAGNOSIS — Z8673 Personal history of transient ischemic attack (TIA), and cerebral infarction without residual deficits: Secondary | ICD-10-CM | POA: Diagnosis not present

## 2021-02-02 DIAGNOSIS — Z8673 Personal history of transient ischemic attack (TIA), and cerebral infarction without residual deficits: Secondary | ICD-10-CM | POA: Diagnosis not present

## 2021-02-03 DIAGNOSIS — Z8673 Personal history of transient ischemic attack (TIA), and cerebral infarction without residual deficits: Secondary | ICD-10-CM | POA: Diagnosis not present

## 2021-02-04 DIAGNOSIS — Z8673 Personal history of transient ischemic attack (TIA), and cerebral infarction without residual deficits: Secondary | ICD-10-CM | POA: Diagnosis not present

## 2021-02-05 DIAGNOSIS — Z8673 Personal history of transient ischemic attack (TIA), and cerebral infarction without residual deficits: Secondary | ICD-10-CM | POA: Diagnosis not present

## 2021-02-06 DIAGNOSIS — Z8673 Personal history of transient ischemic attack (TIA), and cerebral infarction without residual deficits: Secondary | ICD-10-CM | POA: Diagnosis not present

## 2021-02-07 DIAGNOSIS — Z8673 Personal history of transient ischemic attack (TIA), and cerebral infarction without residual deficits: Secondary | ICD-10-CM | POA: Diagnosis not present

## 2021-02-08 DIAGNOSIS — Z8673 Personal history of transient ischemic attack (TIA), and cerebral infarction without residual deficits: Secondary | ICD-10-CM | POA: Diagnosis not present

## 2021-02-09 DIAGNOSIS — Z8673 Personal history of transient ischemic attack (TIA), and cerebral infarction without residual deficits: Secondary | ICD-10-CM | POA: Diagnosis not present

## 2021-02-10 DIAGNOSIS — Z8673 Personal history of transient ischemic attack (TIA), and cerebral infarction without residual deficits: Secondary | ICD-10-CM | POA: Diagnosis not present

## 2021-02-11 DIAGNOSIS — Z8673 Personal history of transient ischemic attack (TIA), and cerebral infarction without residual deficits: Secondary | ICD-10-CM | POA: Diagnosis not present

## 2021-02-12 DIAGNOSIS — Z8673 Personal history of transient ischemic attack (TIA), and cerebral infarction without residual deficits: Secondary | ICD-10-CM | POA: Diagnosis not present

## 2021-02-13 ENCOUNTER — Telehealth: Payer: Self-pay | Admitting: *Deleted

## 2021-02-13 ENCOUNTER — Encounter: Payer: Self-pay | Admitting: Nurse Practitioner

## 2021-02-13 ENCOUNTER — Telehealth (INDEPENDENT_AMBULATORY_CARE_PROVIDER_SITE_OTHER): Payer: Medicaid Other | Admitting: Nurse Practitioner

## 2021-02-13 DIAGNOSIS — Z8673 Personal history of transient ischemic attack (TIA), and cerebral infarction without residual deficits: Secondary | ICD-10-CM | POA: Diagnosis not present

## 2021-02-13 DIAGNOSIS — R159 Full incontinence of feces: Secondary | ICD-10-CM | POA: Diagnosis not present

## 2021-02-13 DIAGNOSIS — R32 Unspecified urinary incontinence: Secondary | ICD-10-CM | POA: Diagnosis not present

## 2021-02-13 NOTE — Progress Notes (Signed)
° °  Virtual Visit  Note Due to COVID-19 pandemic this visit was conducted virtually. This visit type was conducted due to national recommendations for restrictions regarding the COVID-19 Pandemic (e.g. social distancing, sheltering in place) in an effort to limit this patient's exposure and mitigate transmission in our community. All issues noted in this document were discussed and addressed.  A physical exam was not performed with this format.  I connected with Alice [name] on 02/13/21 at 2:55 PM by telephone and verified that I am speaking with the correct person using two identifiers. Alice [name] is currently located at home during visit. The provider, Daryll Drown, NP is located in their office at time of visit.  I discussed the limitations, risks, security and privacy concerns of performing an evaluation and management service by telephone and the availability of in person appointments. I also discussed with the patient that there may be a patient responsible charge related to this service. The patient expressed understanding and agreed to proceed.   History and Present Illness:  HPI  Patient is a 56 year old female who presents with fecal and urinary incontinence.  Patient reports symptoms present after stroke.  Currently she is unable to go without under pads and chuks.  Patient has not been fully assessed for extent of incontinence.  No fever, nausea or other symptoms associated with     Review of Systems  Constitutional: Negative.   Respiratory: Negative.    Cardiovascular: Negative.   Gastrointestinal:        Fecal incontinence  Genitourinary:  Positive for urgency.       Incontinence  Skin:  Negative for rash.  All other systems reviewed and are negative.   Observations/Objective: Tele visit patient not in distress  Assessment and Plan: Completed forms for patients incontinence supplies, disposable underpads/Chux and non-sterile gloved  Follow Up Instructions: Follow  up with worsening or  unresolved symptoms    I discussed the assessment and treatment plan with the patient. The patient was provided an opportunity to ask questions and all were answered. The patient agreed with the plan and demonstrated an understanding of the instructions.   The patient was advised to call back or seek an in-person evaluation if the symptoms worsen or if the condition fails to improve as anticipated.  The above assessment and management plan was discussed with the patient. The patient verbalized understanding of and has agreed to the management plan. Patient is aware to call the clinic if symptoms persist or worsen. Patient is aware when to return to the clinic for a follow-up visit. Patient educated on when it is appropriate to go to the emergency department.   Time call ended:  2:58 pm   I provided 9 minutes of  non face-to-face time during this encounter.    Daryll Drown, NP

## 2021-02-13 NOTE — Telephone Encounter (Signed)
RCVD ppw from [employer] for incontinence supplies, pt does use these, she found the place on line Appt for video visit made for today w/ Je

## 2021-02-14 DIAGNOSIS — Z8673 Personal history of transient ischemic attack (TIA), and cerebral infarction without residual deficits: Secondary | ICD-10-CM | POA: Diagnosis not present

## 2021-02-15 DIAGNOSIS — Z8673 Personal history of transient ischemic attack (TIA), and cerebral infarction without residual deficits: Secondary | ICD-10-CM | POA: Diagnosis not present

## 2021-02-16 ENCOUNTER — Telehealth: Payer: Medicaid Other | Admitting: Nurse Practitioner

## 2021-02-16 DIAGNOSIS — Z8673 Personal history of transient ischemic attack (TIA), and cerebral infarction without residual deficits: Secondary | ICD-10-CM | POA: Diagnosis not present

## 2021-02-17 DIAGNOSIS — R32 Unspecified urinary incontinence: Secondary | ICD-10-CM | POA: Diagnosis not present

## 2021-02-17 DIAGNOSIS — J449 Chronic obstructive pulmonary disease, unspecified: Secondary | ICD-10-CM | POA: Diagnosis not present

## 2021-02-17 DIAGNOSIS — I639 Cerebral infarction, unspecified: Secondary | ICD-10-CM | POA: Diagnosis not present

## 2021-02-17 DIAGNOSIS — Z8673 Personal history of transient ischemic attack (TIA), and cerebral infarction without residual deficits: Secondary | ICD-10-CM | POA: Diagnosis not present

## 2021-02-18 DIAGNOSIS — Z8673 Personal history of transient ischemic attack (TIA), and cerebral infarction without residual deficits: Secondary | ICD-10-CM | POA: Diagnosis not present

## 2021-02-19 DIAGNOSIS — Z8673 Personal history of transient ischemic attack (TIA), and cerebral infarction without residual deficits: Secondary | ICD-10-CM | POA: Diagnosis not present

## 2021-02-20 DIAGNOSIS — Z8673 Personal history of transient ischemic attack (TIA), and cerebral infarction without residual deficits: Secondary | ICD-10-CM | POA: Diagnosis not present

## 2021-02-21 DIAGNOSIS — Z8673 Personal history of transient ischemic attack (TIA), and cerebral infarction without residual deficits: Secondary | ICD-10-CM | POA: Diagnosis not present

## 2021-02-22 DIAGNOSIS — Z8673 Personal history of transient ischemic attack (TIA), and cerebral infarction without residual deficits: Secondary | ICD-10-CM | POA: Diagnosis not present

## 2021-02-23 DIAGNOSIS — Z8673 Personal history of transient ischemic attack (TIA), and cerebral infarction without residual deficits: Secondary | ICD-10-CM | POA: Diagnosis not present

## 2021-02-24 DIAGNOSIS — Z8673 Personal history of transient ischemic attack (TIA), and cerebral infarction without residual deficits: Secondary | ICD-10-CM | POA: Diagnosis not present

## 2021-02-25 ENCOUNTER — Encounter: Payer: Self-pay | Admitting: Nurse Practitioner

## 2021-02-25 ENCOUNTER — Ambulatory Visit (INDEPENDENT_AMBULATORY_CARE_PROVIDER_SITE_OTHER): Payer: Medicaid Other | Admitting: Nurse Practitioner

## 2021-02-25 VITALS — BP 124/77 | HR 78 | Temp 98.8°F | Ht 70.0 in | Wt 204.0 lb

## 2021-02-25 DIAGNOSIS — M5137 Other intervertebral disc degeneration, lumbosacral region: Secondary | ICD-10-CM | POA: Diagnosis not present

## 2021-02-25 DIAGNOSIS — Z8673 Personal history of transient ischemic attack (TIA), and cerebral infarction without residual deficits: Secondary | ICD-10-CM | POA: Diagnosis not present

## 2021-02-25 MED ORDER — BACLOFEN 10 MG PO TABS: 10.0000 mg | ORAL_TABLET | Freq: Three times a day (TID) | ORAL | 0 refills | Status: DC

## 2021-02-25 MED ORDER — METHYLPREDNISOLONE ACETATE 80 MG/ML IJ SUSP
80.0000 mg | Freq: Once | INTRAMUSCULAR | Status: AC
  Administered 2021-02-25: 80 mg via INTRAMUSCULAR

## 2021-02-25 NOTE — Progress Notes (Signed)
4  Acute Office Visit  Subjective:    Patient ID: Alice [name], female    DOB: 1965-12-22, 56 y.o.   MRN: [id number]  Chief Complaint  Patient presents with   Sciatica    X5 days    Back Pain This is a chronic problem. The current episode started more than 1 year ago. The problem occurs constantly. The problem is unchanged. The pain is present in the lumbar spine. The quality of the pain is described as aching and shooting. The pain radiates to the right thigh. The pain is at a severity of 8/10. The pain is severe. The pain is The same all the time. The symptoms are aggravated by position and standing. Stiffness is present All day. Pertinent negatives include no abdominal pain, bladder incontinence, numbness or paresthesias. She has tried muscle relaxant for the symptoms. The treatment provided moderate relief.   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   BACK SURGERY     hardware in back   cyst removed from ovary     FRACTURE SURGERY     LUMBAR SPINE   removal of BB in finger  2008   SALIVARY GLAND SURGERY     tumor reomoved from stomach      Family History  Problem Relation Age of Onset   Hypertension Mother    Cancer Father    CVA Neg Hx     Social History   Socioeconomic History   Marital status: Widowed    Spouse name: Not on file   Number of children: Not on file   Years of education: Not on file   Highest education level: Not on file  Occupational History   Occupation: disabled  Tobacco Use   Smoking status: Every Day    Packs/day: 0.50    Years: 40.00    Pack years: 20.00    Types: Cigarettes   Smokeless tobacco:  Never  Vaping Use   Vaping Use: Never used  Substance and Sexual Activity   Alcohol use: Not Currently    Comment: today   Drug use: Not Currently    Types: Heroin, "Crack" cocaine, Oxycodone, Cocaine    Comment: last used 05/24/20   Sexual activity: Not Currently  Other Topics Concern   Not on file  Social History Narrative   Single, 2 children; disabled.    Social Determinants of Health   Financial Resource Strain: Not on file  Food Insecurity: Not on file  Transportation Needs: Not on file  Physical Activity: Not on file  Stress: Not on file  Social Connections: Not on file  Intimate Partner Violence: Not on file    Outpatient Medications Prior to Visit  Medication Sig Dispense Refill   albuterol (VENTOLIN HFA) 108 (90 Base) MCG/ACT inhaler Inhale 2 puffs into the lungs every 6 (six) hours as needed for wheezing or shortness of breath. 1 each 2   aspirin EC 81 MG EC tablet Take 1 tablet (81 mg total) by mouth daily with breakfast. For Stroke Prevention 30 tablet 11  atorvastatin (LIPITOR) 40 MG tablet Take 1 tablet (40 mg total) by mouth daily. 30 tablet 11   DULoxetine (CYMBALTA) 30 MG capsule Take 30 mg by mouth daily.     gabapentin (NEURONTIN) 100 MG capsule Take by mouth.     levETIRAcetam (KEPPRA) 500 MG tablet Take 1 tablet (500 mg total) by mouth 2 (two) times daily. 60 tablet 5   lisinopril (ZESTRIL) 10 MG tablet Take 1 tablet (10 mg total) by mouth daily. 90 tablet 1   SUBOXONE 8-2 MG FILM SMARTSIG:0.5 Strip(s) By Mouth 4 Times Daily     umeclidinium-vilanterol (ANORO ELLIPTA) 62.5-25 MCG/ACT AEPB Inhale 1 puff into the lungs daily. For COPD 30 each 3   baclofen (LIORESAL) 10 MG tablet TAKE ONE TABLET THREE TIMES DAILY AS NEEDED     nicotine (NICODERM CQ - DOSED IN MG/24 HOURS) 14 mg/24hr patch Place 1 patch (14 mg total) onto the skin daily. 28 patch 0   lisinopril (ZESTRIL) 5 MG tablet Take 5 mg by mouth daily.     No facility-administered medications prior to  visit.    Allergies  Allergen Reactions   Benadryl [Diphenhydramine Hcl] Hives   Codeine Rash   Tetracycline Itching and Rash    Review of Systems  Constitutional: Negative.   HENT: Negative.    Eyes: Negative.   Respiratory: Negative.    Cardiovascular: Negative.   Gastrointestinal:  Negative for abdominal pain.  Genitourinary:  Negative for bladder incontinence.  Musculoskeletal:  Positive for back pain.  Skin:  Negative for rash.  Neurological:  Negative for numbness and paresthesias.  All other systems reviewed and are negative.     Objective:    Physical Exam Vitals and nursing note reviewed.  Constitutional:      Appearance: Normal appearance. She is obese.  HENT:     Right Ear: External ear normal.     Left Ear: External ear normal.     Nose: Nose normal.     Mouth/Throat:     Mouth: Mucous membranes are moist.     Pharynx: Oropharynx is clear.  Eyes:     Conjunctiva/sclera: Conjunctivae normal.  Cardiovascular:     Pulses: Normal pulses.     Heart sounds: Normal heart sounds.  Pulmonary:     Effort: Pulmonary effort is normal.     Breath sounds: Normal breath sounds.  Abdominal:     General: Bowel sounds are normal.  Musculoskeletal:     Lumbar back: Tenderness present. Decreased range of motion.  Skin:    General: Skin is warm.     Findings: No rash.  Neurological:     Mental Status: She is alert and oriented to person, place, and time.  Psychiatric:        Behavior: Behavior normal.    BP 124/77    Pulse 78    Temp 98.8 F (37.1 C)    Ht 5\' 10"  (1.778 m)    Wt 204 lb (92.5 kg)    LMP 07/30/2011    SpO2 96%    BMI 29.27 kg/m  Wt Readings from Last 3 Encounters:  02/25/21 204 lb (92.5 kg)  12/12/20 195 lb (88.5 kg)  10/29/20 195 lb 12.3 oz (88.8 kg)    Health Maintenance Due  Topic Date Due   COVID-19 Vaccine (1) Never done   PAP SMEAR-Modifier  Never done   COLONOSCOPY (Pts 45-7yrs Insurance coverage will need to be confirmed)  Never  done   TETANUS/TDAP  01/17/2015  MAMMOGRAM  01/25/2015   Zoster Vaccines- Shingrix (1 of 2) Never done    There are no preventive care reminders to display for this patient.   Lab Results  Component Value Date   TSH 1.979 10/27/2020   Lab Results  Component Value Date   WBC 5.2 10/27/2020   HGB 13.5 10/27/2020   HCT 41.3 10/27/2020   MCV 91.4 10/27/2020   PLT 199 10/27/2020   Lab Results  Component Value Date   NA 137 10/27/2020   K 3.8 10/27/2020   CO2 30 10/27/2020   GLUCOSE 113 (H) 10/27/2020   BUN 17 10/27/2020   CREATININE 0.72 10/27/2020   BILITOT 0.5 10/27/2020   ALKPHOS 89 10/27/2020   AST 22 10/27/2020   ALT 13 10/27/2020   PROT 8.1 10/27/2020   ALBUMIN 4.4 10/27/2020   CALCIUM 9.2 10/27/2020   ANIONGAP 4 (L) 10/27/2020   GFR 126.94 03/30/2010   Lab Results  Component Value Date   CHOL 174 10/27/2020   Lab Results  Component Value Date   HDL 50 10/27/2020   Lab Results  Component Value Date   LDLCALC 107 (H) 10/27/2020   Lab Results  Component Value Date   TRIG 87 10/27/2020   Lab Results  Component Value Date   CHOLHDL 3.5 10/27/2020   Lab Results  Component Value Date   HGBA1C 5.5 10/27/2020       Assessment & Plan:  Back pain exacerbated in the past 7 days. -Advised patient to take baclofen as prescribed 3 times a day.  Patient has not been taking medication as prescribed -Depo-Medrol 80 given in clinic -Follow-up with worsening or unresolved symptoms.   Problem List Items Addressed This Visit       Musculoskeletal and Integument   DDD (degenerative disc disease), lumbosacral - Primary   Relevant Medications   baclofen (LIORESAL) 10 MG tablet     Meds ordered this encounter  Medications   baclofen (LIORESAL) 10 MG tablet    Sig: Take 1 tablet (10 mg total) by mouth 3 (three) times daily.    Dispense:  30 each    Refill:  0    Order Specific Question:   Supervising Provider    Answer:   Standley Brooking      Daryll Drown, NP

## 2021-02-25 NOTE — Addendum Note (Signed)
Addended by: Angela Nevin D on: 02/25/2021 05:08 PM   Modules accepted: Orders

## 2021-02-25 NOTE — Patient Instructions (Signed)
Sciatica Sciatica is pain, numbness, weakness, or tingling along the path of the sciatic nerve. The sciatic nerve starts in the lower back and runs down the back of each leg. The nerve controls the muscles in the lower leg and in the back of the knee. It also provides feeling (sensation) to the back of the thigh, the lower leg, and the sole of the foot. Sciatica is a symptom of another medical condition that pinches or puts pressure on the sciatic nerve. Sciatica most often only affects one side of the body. Sciatica usually goes away on its own or with treatment. In some cases, sciatica may come back (recur). What are the causes? This condition is caused by pressure on the sciatic nerve or pinching of the nerve. This may be the result of: A disk in between the bones of the spine bulging out too far (herniated disk). Age-related changes in the spinal disks. A pain disorder that affects a muscle in the buttock. Extra bone growth near the sciatic nerve. A break (fracture) of the pelvis. Pregnancy. Tumor. This is rare. What increases the risk? The following factors may make you more likely to develop this condition: Playing sports that place pressure or stress on the spine. Having poor strength and flexibility. A history of back injury or surgery. Sitting for long periods of time. Doing activities that involve repetitive bending or lifting. Obesity. What are the signs or symptoms? Symptoms can vary from mild to very severe, and they may include: Any of these problems in the lower back, leg, hip, or buttock: Mild tingling, numbness, or dull aches. Burning sensations. Sharp pains. Numbness in the back of the calf or the sole of the foot. Leg weakness. Severe back pain that makes movement difficult. Symptoms may get worse when you cough, sneeze, or laugh, or when you sit or stand for long periods of time. How is this diagnosed? This condition may be diagnosed based on: Your symptoms and  medical history. A physical exam. Blood tests. Imaging tests, such as: X-rays. MRI. CT scan. How is this treated? In many cases, this condition improves on its own without treatment. However, treatment may include: Reducing or modifying physical activity. Exercising and stretching. Icing and applying heat to the affected area. Medicines that help to: Relieve pain and swelling. Relax your muscles. Injections of medicines that help to relieve pain, irritation, and inflammation around the sciatic nerve (steroids). Surgery. Follow these instructions at home: Medicines Take over-the-counter and prescription medicines only as told by your health care provider. Ask your health care provider if the medicine prescribed to you: Requires you to avoid driving or using heavy machinery. Can cause constipation. You may need to take these actions to prevent or treat constipation: Drink enough fluid to keep your urine pale yellow. Take over-the-counter or prescription medicines. Eat foods that are high in fiber, such as beans, whole grains, and fresh fruits and vegetables. Limit foods that are high in fat and processed sugars, such as fried or sweet foods. Managing pain   If directed, put ice on the affected area. Put ice in a plastic bag. Place a towel between your skin and the bag. Leave the ice on for 20 minutes, 2-3 times a day. If directed, apply heat to the affected area. Use the heat source that your health care provider recommends, such as a moist heat pack or a heating pad. Place a towel between your skin and the heat source. Leave the heat on for 20-30 minutes. Remove   the heat if your skin turns bright red. This is especially important if you are unable to feel pain, heat, or cold. You may have a greater risk of getting burned. Activity  Return to your normal activities as told by your health care provider. Ask your health care provider what activities are safe for you. Avoid  activities that make your symptoms worse. Take brief periods of rest throughout the day. When you rest for longer periods, mix in some mild activity or stretching between periods of rest. This will help to prevent stiffness and pain. Avoid sitting for long periods of time without moving. Get up and move around at least one time each hour. Exercise and stretch regularly, as told by your health care provider. Do not lift anything that is heavier than 10 lb (4.5 kg) while you have symptoms of sciatica. When you do not have symptoms, you should still avoid heavy lifting, especially repetitive heavy lifting. When you lift objects, always use proper lifting technique, which includes: Bending your knees. Keeping the load close to your body. Avoiding twisting. General instructions Maintain a healthy weight. Excess weight puts extra stress on your back. Wear supportive, comfortable shoes. Avoid wearing high heels. Avoid sleeping on a mattress that is too soft or too hard. A mattress that is firm enough to support your back when you sleep may help to reduce your pain. Keep all follow-up visits as told by your health care provider. This is important. Contact a health care provider if: You have pain that: Wakes you up when you are sleeping. Gets worse when you lie down. Is worse than you have experienced in the past. Lasts longer than 4 weeks. You have an unexplained weight loss. Get help right away if: You are not able to control when you urinate or have bowel movements (incontinence). You have: Weakness in your lower back, pelvis, buttocks, or legs that gets worse. Redness or swelling of your back. A burning sensation when you urinate. Summary Sciatica is pain, numbness, weakness, or tingling along the path of the sciatic nerve. This condition is caused by pressure on the sciatic nerve or pinching of the nerve. Sciatica can cause pain, numbness, or tingling in the lower back, legs, hips, and  buttocks. Treatment often includes rest, exercise, medicines, and applying ice or heat. This information is not intended to replace advice given to you by your health care provider. Make sure you discuss any questions you have with your health care provider. Document Revised: 01/16/2018 Document Reviewed: 01/16/2018 Elsevier Patient Education  2022 [employer]  

## 2021-02-26 DIAGNOSIS — Z8673 Personal history of transient ischemic attack (TIA), and cerebral infarction without residual deficits: Secondary | ICD-10-CM | POA: Diagnosis not present

## 2021-02-27 DIAGNOSIS — Z8673 Personal history of transient ischemic attack (TIA), and cerebral infarction without residual deficits: Secondary | ICD-10-CM | POA: Diagnosis not present

## 2021-02-28 DIAGNOSIS — Z8673 Personal history of transient ischemic attack (TIA), and cerebral infarction without residual deficits: Secondary | ICD-10-CM | POA: Diagnosis not present

## 2021-03-01 DIAGNOSIS — Z8673 Personal history of transient ischemic attack (TIA), and cerebral infarction without residual deficits: Secondary | ICD-10-CM | POA: Diagnosis not present

## 2021-03-02 DIAGNOSIS — Z8673 Personal history of transient ischemic attack (TIA), and cerebral infarction without residual deficits: Secondary | ICD-10-CM | POA: Diagnosis not present

## 2021-03-02 DIAGNOSIS — I639 Cerebral infarction, unspecified: Secondary | ICD-10-CM | POA: Diagnosis not present

## 2021-03-02 DIAGNOSIS — J449 Chronic obstructive pulmonary disease, unspecified: Secondary | ICD-10-CM | POA: Diagnosis not present

## 2021-03-02 DIAGNOSIS — R32 Unspecified urinary incontinence: Secondary | ICD-10-CM | POA: Diagnosis not present

## 2021-03-03 DIAGNOSIS — Z8673 Personal history of transient ischemic attack (TIA), and cerebral infarction without residual deficits: Secondary | ICD-10-CM | POA: Diagnosis not present

## 2021-03-04 DIAGNOSIS — Z8673 Personal history of transient ischemic attack (TIA), and cerebral infarction without residual deficits: Secondary | ICD-10-CM | POA: Diagnosis not present

## 2021-03-05 DIAGNOSIS — Z8673 Personal history of transient ischemic attack (TIA), and cerebral infarction without residual deficits: Secondary | ICD-10-CM | POA: Diagnosis not present

## 2021-03-06 DIAGNOSIS — Z8673 Personal history of transient ischemic attack (TIA), and cerebral infarction without residual deficits: Secondary | ICD-10-CM | POA: Diagnosis not present

## 2021-03-07 DIAGNOSIS — Z8673 Personal history of transient ischemic attack (TIA), and cerebral infarction without residual deficits: Secondary | ICD-10-CM | POA: Diagnosis not present

## 2021-03-08 DIAGNOSIS — Z8673 Personal history of transient ischemic attack (TIA), and cerebral infarction without residual deficits: Secondary | ICD-10-CM | POA: Diagnosis not present

## 2021-03-09 DIAGNOSIS — Z8673 Personal history of transient ischemic attack (TIA), and cerebral infarction without residual deficits: Secondary | ICD-10-CM | POA: Diagnosis not present

## 2021-03-10 DIAGNOSIS — Z8673 Personal history of transient ischemic attack (TIA), and cerebral infarction without residual deficits: Secondary | ICD-10-CM | POA: Diagnosis not present

## 2021-03-11 DIAGNOSIS — H5213 Myopia, bilateral: Secondary | ICD-10-CM | POA: Diagnosis not present

## 2021-03-11 DIAGNOSIS — Z8673 Personal history of transient ischemic attack (TIA), and cerebral infarction without residual deficits: Secondary | ICD-10-CM | POA: Diagnosis not present

## 2021-03-12 DIAGNOSIS — Z8673 Personal history of transient ischemic attack (TIA), and cerebral infarction without residual deficits: Secondary | ICD-10-CM | POA: Diagnosis not present

## 2021-03-13 DIAGNOSIS — Z8673 Personal history of transient ischemic attack (TIA), and cerebral infarction without residual deficits: Secondary | ICD-10-CM | POA: Diagnosis not present

## 2021-03-14 DIAGNOSIS — Z8673 Personal history of transient ischemic attack (TIA), and cerebral infarction without residual deficits: Secondary | ICD-10-CM | POA: Diagnosis not present

## 2021-03-15 DIAGNOSIS — Z8673 Personal history of transient ischemic attack (TIA), and cerebral infarction without residual deficits: Secondary | ICD-10-CM | POA: Diagnosis not present

## 2021-03-16 DIAGNOSIS — Z8673 Personal history of transient ischemic attack (TIA), and cerebral infarction without residual deficits: Secondary | ICD-10-CM | POA: Diagnosis not present

## 2021-03-17 DIAGNOSIS — Z8673 Personal history of transient ischemic attack (TIA), and cerebral infarction without residual deficits: Secondary | ICD-10-CM | POA: Diagnosis not present

## 2021-03-18 DIAGNOSIS — Z8673 Personal history of transient ischemic attack (TIA), and cerebral infarction without residual deficits: Secondary | ICD-10-CM | POA: Diagnosis not present

## 2021-03-19 DIAGNOSIS — Z8673 Personal history of transient ischemic attack (TIA), and cerebral infarction without residual deficits: Secondary | ICD-10-CM | POA: Diagnosis not present

## 2021-03-20 DIAGNOSIS — Z8673 Personal history of transient ischemic attack (TIA), and cerebral infarction without residual deficits: Secondary | ICD-10-CM | POA: Diagnosis not present

## 2021-03-21 DIAGNOSIS — Z8673 Personal history of transient ischemic attack (TIA), and cerebral infarction without residual deficits: Secondary | ICD-10-CM | POA: Diagnosis not present

## 2021-03-22 DIAGNOSIS — Z8673 Personal history of transient ischemic attack (TIA), and cerebral infarction without residual deficits: Secondary | ICD-10-CM | POA: Diagnosis not present

## 2021-03-23 DIAGNOSIS — Z8673 Personal history of transient ischemic attack (TIA), and cerebral infarction without residual deficits: Secondary | ICD-10-CM | POA: Diagnosis not present

## 2021-03-24 DIAGNOSIS — Z8673 Personal history of transient ischemic attack (TIA), and cerebral infarction without residual deficits: Secondary | ICD-10-CM | POA: Diagnosis not present

## 2021-03-25 DIAGNOSIS — Z8673 Personal history of transient ischemic attack (TIA), and cerebral infarction without residual deficits: Secondary | ICD-10-CM | POA: Diagnosis not present

## 2021-03-26 DIAGNOSIS — Z8673 Personal history of transient ischemic attack (TIA), and cerebral infarction without residual deficits: Secondary | ICD-10-CM | POA: Diagnosis not present

## 2021-03-27 DIAGNOSIS — Z8673 Personal history of transient ischemic attack (TIA), and cerebral infarction without residual deficits: Secondary | ICD-10-CM | POA: Diagnosis not present

## 2021-03-28 ENCOUNTER — Other Ambulatory Visit: Payer: Self-pay | Admitting: Nurse Practitioner

## 2021-03-28 DIAGNOSIS — Z8673 Personal history of transient ischemic attack (TIA), and cerebral infarction without residual deficits: Secondary | ICD-10-CM | POA: Diagnosis not present

## 2021-03-28 DIAGNOSIS — I10 Essential (primary) hypertension: Secondary | ICD-10-CM

## 2021-03-29 DIAGNOSIS — Z8673 Personal history of transient ischemic attack (TIA), and cerebral infarction without residual deficits: Secondary | ICD-10-CM | POA: Diagnosis not present

## 2021-03-30 DIAGNOSIS — Z8673 Personal history of transient ischemic attack (TIA), and cerebral infarction without residual deficits: Secondary | ICD-10-CM | POA: Diagnosis not present

## 2021-03-31 DIAGNOSIS — Z8673 Personal history of transient ischemic attack (TIA), and cerebral infarction without residual deficits: Secondary | ICD-10-CM | POA: Diagnosis not present

## 2021-04-01 DIAGNOSIS — Z8673 Personal history of transient ischemic attack (TIA), and cerebral infarction without residual deficits: Secondary | ICD-10-CM | POA: Diagnosis not present

## 2021-04-02 DIAGNOSIS — Z8673 Personal history of transient ischemic attack (TIA), and cerebral infarction without residual deficits: Secondary | ICD-10-CM | POA: Diagnosis not present

## 2021-04-03 DIAGNOSIS — Z8673 Personal history of transient ischemic attack (TIA), and cerebral infarction without residual deficits: Secondary | ICD-10-CM | POA: Diagnosis not present

## 2021-04-04 DIAGNOSIS — Z8673 Personal history of transient ischemic attack (TIA), and cerebral infarction without residual deficits: Secondary | ICD-10-CM | POA: Diagnosis not present

## 2021-04-05 DIAGNOSIS — Z8673 Personal history of transient ischemic attack (TIA), and cerebral infarction without residual deficits: Secondary | ICD-10-CM | POA: Diagnosis not present

## 2021-04-06 DIAGNOSIS — Z8673 Personal history of transient ischemic attack (TIA), and cerebral infarction without residual deficits: Secondary | ICD-10-CM | POA: Diagnosis not present

## 2021-04-07 DIAGNOSIS — Z8673 Personal history of transient ischemic attack (TIA), and cerebral infarction without residual deficits: Secondary | ICD-10-CM | POA: Diagnosis not present

## 2021-04-08 DIAGNOSIS — Z8673 Personal history of transient ischemic attack (TIA), and cerebral infarction without residual deficits: Secondary | ICD-10-CM | POA: Diagnosis not present

## 2021-04-09 DIAGNOSIS — Z8673 Personal history of transient ischemic attack (TIA), and cerebral infarction without residual deficits: Secondary | ICD-10-CM | POA: Diagnosis not present

## 2021-04-10 DIAGNOSIS — Z8673 Personal history of transient ischemic attack (TIA), and cerebral infarction without residual deficits: Secondary | ICD-10-CM | POA: Diagnosis not present

## 2021-04-11 DIAGNOSIS — Z8673 Personal history of transient ischemic attack (TIA), and cerebral infarction without residual deficits: Secondary | ICD-10-CM | POA: Diagnosis not present

## 2021-04-12 DIAGNOSIS — Z8673 Personal history of transient ischemic attack (TIA), and cerebral infarction without residual deficits: Secondary | ICD-10-CM | POA: Diagnosis not present

## 2021-04-13 DIAGNOSIS — Z8673 Personal history of transient ischemic attack (TIA), and cerebral infarction without residual deficits: Secondary | ICD-10-CM | POA: Diagnosis not present

## 2021-04-14 DIAGNOSIS — Z8673 Personal history of transient ischemic attack (TIA), and cerebral infarction without residual deficits: Secondary | ICD-10-CM | POA: Diagnosis not present

## 2021-04-15 DIAGNOSIS — Z8673 Personal history of transient ischemic attack (TIA), and cerebral infarction without residual deficits: Secondary | ICD-10-CM | POA: Diagnosis not present

## 2021-04-16 DIAGNOSIS — Z8673 Personal history of transient ischemic attack (TIA), and cerebral infarction without residual deficits: Secondary | ICD-10-CM | POA: Diagnosis not present

## 2021-04-17 DIAGNOSIS — Z8673 Personal history of transient ischemic attack (TIA), and cerebral infarction without residual deficits: Secondary | ICD-10-CM | POA: Diagnosis not present

## 2021-04-18 DIAGNOSIS — Z8673 Personal history of transient ischemic attack (TIA), and cerebral infarction without residual deficits: Secondary | ICD-10-CM | POA: Diagnosis not present

## 2021-04-19 DIAGNOSIS — Z8673 Personal history of transient ischemic attack (TIA), and cerebral infarction without residual deficits: Secondary | ICD-10-CM | POA: Diagnosis not present

## 2021-04-20 DIAGNOSIS — Z8673 Personal history of transient ischemic attack (TIA), and cerebral infarction without residual deficits: Secondary | ICD-10-CM | POA: Diagnosis not present

## 2021-04-21 DIAGNOSIS — Z8673 Personal history of transient ischemic attack (TIA), and cerebral infarction without residual deficits: Secondary | ICD-10-CM | POA: Diagnosis not present

## 2021-04-22 ENCOUNTER — Telehealth: Payer: Self-pay | Admitting: Nurse Practitioner

## 2021-04-22 DIAGNOSIS — R32 Unspecified urinary incontinence: Secondary | ICD-10-CM | POA: Diagnosis not present

## 2021-04-22 DIAGNOSIS — Z8673 Personal history of transient ischemic attack (TIA), and cerebral infarction without residual deficits: Secondary | ICD-10-CM | POA: Diagnosis not present

## 2021-04-22 DIAGNOSIS — I639 Cerebral infarction, unspecified: Secondary | ICD-10-CM | POA: Diagnosis not present

## 2021-04-22 DIAGNOSIS — J449 Chronic obstructive pulmonary disease, unspecified: Secondary | ICD-10-CM | POA: Diagnosis not present

## 2021-04-22 NOTE — Telephone Encounter (Signed)
Form placed on providers desk 

## 2021-04-23 DIAGNOSIS — Z8673 Personal history of transient ischemic attack (TIA), and cerebral infarction without residual deficits: Secondary | ICD-10-CM | POA: Diagnosis not present

## 2021-04-24 DIAGNOSIS — Z8673 Personal history of transient ischemic attack (TIA), and cerebral infarction without residual deficits: Secondary | ICD-10-CM | POA: Diagnosis not present

## 2021-04-25 DIAGNOSIS — Z8673 Personal history of transient ischemic attack (TIA), and cerebral infarction without residual deficits: Secondary | ICD-10-CM | POA: Diagnosis not present

## 2021-04-26 DIAGNOSIS — Z8673 Personal history of transient ischemic attack (TIA), and cerebral infarction without residual deficits: Secondary | ICD-10-CM | POA: Diagnosis not present

## 2021-04-27 DIAGNOSIS — Z8673 Personal history of transient ischemic attack (TIA), and cerebral infarction without residual deficits: Secondary | ICD-10-CM | POA: Diagnosis not present

## 2021-04-28 DIAGNOSIS — Z8673 Personal history of transient ischemic attack (TIA), and cerebral infarction without residual deficits: Secondary | ICD-10-CM | POA: Diagnosis not present

## 2021-04-29 DIAGNOSIS — Z8673 Personal history of transient ischemic attack (TIA), and cerebral infarction without residual deficits: Secondary | ICD-10-CM | POA: Diagnosis not present

## 2021-04-30 DIAGNOSIS — Z8673 Personal history of transient ischemic attack (TIA), and cerebral infarction without residual deficits: Secondary | ICD-10-CM | POA: Diagnosis not present

## 2021-05-01 DIAGNOSIS — Z8673 Personal history of transient ischemic attack (TIA), and cerebral infarction without residual deficits: Secondary | ICD-10-CM | POA: Diagnosis not present

## 2021-05-02 DIAGNOSIS — Z8673 Personal history of transient ischemic attack (TIA), and cerebral infarction without residual deficits: Secondary | ICD-10-CM | POA: Diagnosis not present

## 2021-05-03 DIAGNOSIS — Z8673 Personal history of transient ischemic attack (TIA), and cerebral infarction without residual deficits: Secondary | ICD-10-CM | POA: Diagnosis not present

## 2021-05-04 DIAGNOSIS — Z8673 Personal history of transient ischemic attack (TIA), and cerebral infarction without residual deficits: Secondary | ICD-10-CM | POA: Diagnosis not present

## 2021-05-05 DIAGNOSIS — Z8673 Personal history of transient ischemic attack (TIA), and cerebral infarction without residual deficits: Secondary | ICD-10-CM | POA: Diagnosis not present

## 2021-05-06 DIAGNOSIS — Z8673 Personal history of transient ischemic attack (TIA), and cerebral infarction without residual deficits: Secondary | ICD-10-CM | POA: Diagnosis not present

## 2021-05-07 DIAGNOSIS — Z8673 Personal history of transient ischemic attack (TIA), and cerebral infarction without residual deficits: Secondary | ICD-10-CM | POA: Diagnosis not present

## 2021-05-08 DIAGNOSIS — Z8673 Personal history of transient ischemic attack (TIA), and cerebral infarction without residual deficits: Secondary | ICD-10-CM | POA: Diagnosis not present

## 2021-05-09 DIAGNOSIS — Z8673 Personal history of transient ischemic attack (TIA), and cerebral infarction without residual deficits: Secondary | ICD-10-CM | POA: Diagnosis not present

## 2021-05-10 DIAGNOSIS — Z8673 Personal history of transient ischemic attack (TIA), and cerebral infarction without residual deficits: Secondary | ICD-10-CM | POA: Diagnosis not present

## 2021-05-11 DIAGNOSIS — Z8673 Personal history of transient ischemic attack (TIA), and cerebral infarction without residual deficits: Secondary | ICD-10-CM | POA: Diagnosis not present

## 2021-05-12 DIAGNOSIS — Z8673 Personal history of transient ischemic attack (TIA), and cerebral infarction without residual deficits: Secondary | ICD-10-CM | POA: Diagnosis not present

## 2021-05-13 DIAGNOSIS — Z8673 Personal history of transient ischemic attack (TIA), and cerebral infarction without residual deficits: Secondary | ICD-10-CM | POA: Diagnosis not present

## 2021-05-13 NOTE — Telephone Encounter (Signed)
[facility] form faxed on 05/12/21 ?

## 2021-05-14 DIAGNOSIS — Z8673 Personal history of transient ischemic attack (TIA), and cerebral infarction without residual deficits: Secondary | ICD-10-CM | POA: Diagnosis not present

## 2021-05-15 DIAGNOSIS — Z8673 Personal history of transient ischemic attack (TIA), and cerebral infarction without residual deficits: Secondary | ICD-10-CM | POA: Diagnosis not present

## 2021-05-16 DIAGNOSIS — Z8673 Personal history of transient ischemic attack (TIA), and cerebral infarction without residual deficits: Secondary | ICD-10-CM | POA: Diagnosis not present

## 2021-05-17 DIAGNOSIS — Z8673 Personal history of transient ischemic attack (TIA), and cerebral infarction without residual deficits: Secondary | ICD-10-CM | POA: Diagnosis not present

## 2021-05-18 DIAGNOSIS — Z8673 Personal history of transient ischemic attack (TIA), and cerebral infarction without residual deficits: Secondary | ICD-10-CM | POA: Diagnosis not present

## 2021-05-19 DIAGNOSIS — Z8673 Personal history of transient ischemic attack (TIA), and cerebral infarction without residual deficits: Secondary | ICD-10-CM | POA: Diagnosis not present

## 2021-05-20 DIAGNOSIS — Z8673 Personal history of transient ischemic attack (TIA), and cerebral infarction without residual deficits: Secondary | ICD-10-CM | POA: Diagnosis not present

## 2021-05-21 DIAGNOSIS — Z8673 Personal history of transient ischemic attack (TIA), and cerebral infarction without residual deficits: Secondary | ICD-10-CM | POA: Diagnosis not present

## 2021-05-22 DIAGNOSIS — I63339 Cerebral infarction due to thrombosis of unspecified posterior cerebral artery: Secondary | ICD-10-CM | POA: Diagnosis not present

## 2021-05-23 DIAGNOSIS — I63339 Cerebral infarction due to thrombosis of unspecified posterior cerebral artery: Secondary | ICD-10-CM | POA: Diagnosis not present

## 2021-05-24 DIAGNOSIS — I63339 Cerebral infarction due to thrombosis of unspecified posterior cerebral artery: Secondary | ICD-10-CM | POA: Diagnosis not present

## 2021-05-25 DIAGNOSIS — I63339 Cerebral infarction due to thrombosis of unspecified posterior cerebral artery: Secondary | ICD-10-CM | POA: Diagnosis not present

## 2021-05-26 DIAGNOSIS — I63339 Cerebral infarction due to thrombosis of unspecified posterior cerebral artery: Secondary | ICD-10-CM | POA: Diagnosis not present

## 2021-05-27 DIAGNOSIS — I63339 Cerebral infarction due to thrombosis of unspecified posterior cerebral artery: Secondary | ICD-10-CM | POA: Diagnosis not present

## 2021-05-28 DIAGNOSIS — I63339 Cerebral infarction due to thrombosis of unspecified posterior cerebral artery: Secondary | ICD-10-CM | POA: Diagnosis not present

## 2021-05-29 DIAGNOSIS — I63339 Cerebral infarction due to thrombosis of unspecified posterior cerebral artery: Secondary | ICD-10-CM | POA: Diagnosis not present

## 2021-05-30 DIAGNOSIS — I63339 Cerebral infarction due to thrombosis of unspecified posterior cerebral artery: Secondary | ICD-10-CM | POA: Diagnosis not present

## 2021-05-31 ENCOUNTER — Emergency Department (HOSPITAL_COMMUNITY): Payer: Medicaid Other

## 2021-05-31 ENCOUNTER — Other Ambulatory Visit: Payer: Self-pay

## 2021-05-31 ENCOUNTER — Observation Stay (HOSPITAL_COMMUNITY)
Admission: EM | Admit: 2021-05-31 | Discharge: 2021-06-01 | Disposition: A | Discharge status: Home / Self Care | Payer: Medicaid Other | Attending: Family Medicine | Admitting: Family Medicine

## 2021-05-31 ENCOUNTER — Encounter (HOSPITAL_COMMUNITY): Payer: Self-pay

## 2021-05-31 DIAGNOSIS — Z79899 Other long term (current) drug therapy: Secondary | ICD-10-CM | POA: Insufficient documentation

## 2021-05-31 DIAGNOSIS — F17210 Nicotine dependence, cigarettes, uncomplicated: Secondary | ICD-10-CM | POA: Insufficient documentation

## 2021-05-31 DIAGNOSIS — I69322 Dysarthria following cerebral infarction: Secondary | ICD-10-CM | POA: Insufficient documentation

## 2021-05-31 DIAGNOSIS — Z20822 Contact with and (suspected) exposure to covid-19: Secondary | ICD-10-CM | POA: Insufficient documentation

## 2021-05-31 DIAGNOSIS — R4781 Slurred speech: Secondary | ICD-10-CM | POA: Diagnosis not present

## 2021-05-31 DIAGNOSIS — I6930 Unspecified sequelae of cerebral infarction: Secondary | ICD-10-CM

## 2021-05-31 DIAGNOSIS — R2990 Unspecified symptoms and signs involving the nervous system: Secondary | ICD-10-CM

## 2021-05-31 DIAGNOSIS — Z7982 Long term (current) use of aspirin: Secondary | ICD-10-CM | POA: Diagnosis not present

## 2021-05-31 DIAGNOSIS — I6523 Occlusion and stenosis of bilateral carotid arteries: Secondary | ICD-10-CM | POA: Diagnosis not present

## 2021-05-31 DIAGNOSIS — F1911 Other psychoactive substance abuse, in remission: Secondary | ICD-10-CM | POA: Diagnosis present

## 2021-05-31 DIAGNOSIS — F17200 Nicotine dependence, unspecified, uncomplicated: Secondary | ICD-10-CM | POA: Diagnosis present

## 2021-05-31 DIAGNOSIS — Z8673 Personal history of transient ischemic attack (TIA), and cerebral infarction without residual deficits: Secondary | ICD-10-CM

## 2021-05-31 DIAGNOSIS — G459 Transient cerebral ischemic attack, unspecified: Principal | ICD-10-CM | POA: Insufficient documentation

## 2021-05-31 DIAGNOSIS — R471 Dysarthria and anarthria: Secondary | ICD-10-CM | POA: Diagnosis not present

## 2021-05-31 DIAGNOSIS — I639 Cerebral infarction, unspecified: Secondary | ICD-10-CM | POA: Diagnosis present

## 2021-05-31 DIAGNOSIS — I10 Essential (primary) hypertension: Secondary | ICD-10-CM | POA: Diagnosis not present

## 2021-05-31 DIAGNOSIS — J441 Chronic obstructive pulmonary disease with (acute) exacerbation: Secondary | ICD-10-CM | POA: Diagnosis present

## 2021-05-31 DIAGNOSIS — I63339 Cerebral infarction due to thrombosis of unspecified posterior cerebral artery: Secondary | ICD-10-CM | POA: Diagnosis not present

## 2021-05-31 DIAGNOSIS — R29898 Other symptoms and signs involving the musculoskeletal system: Secondary | ICD-10-CM

## 2021-05-31 DIAGNOSIS — N3000 Acute cystitis without hematuria: Secondary | ICD-10-CM | POA: Diagnosis not present

## 2021-05-31 DIAGNOSIS — J449 Chronic obstructive pulmonary disease, unspecified: Secondary | ICD-10-CM | POA: Insufficient documentation

## 2021-05-31 DIAGNOSIS — B182 Chronic viral hepatitis C: Secondary | ICD-10-CM | POA: Insufficient documentation

## 2021-05-31 DIAGNOSIS — R531 Weakness: Secondary | ICD-10-CM | POA: Diagnosis present

## 2021-05-31 LAB — COMPREHENSIVE METABOLIC PANEL
ALT: 11 U/L (ref 0–44)
AST: 16 U/L (ref 15–41)
Albumin: 4.1 g/dL (ref 3.5–5.0)
Alkaline Phosphatase: 78 U/L (ref 38–126)
Anion gap: 7 (ref 5–15)
BUN: 9 mg/dL (ref 6–20)
CO2: 27 mmol/L (ref 22–32)
Calcium: 9.2 mg/dL (ref 8.9–10.3)
Chloride: 106 mmol/L (ref 98–111)
Creatinine, Ser: 0.67 mg/dL (ref 0.44–1.00)
GFR, Estimated: >60 mL/min (ref >60)
Glucose, Bld: 110 mg/dL — ABNORMAL HIGH (ref 70–99)
Potassium: 3.7 mmol/L (ref 3.5–5.1)
Sodium: 140 mmol/L (ref 135–145)
Total Bilirubin: 0.4 mg/dL (ref 0.3–1.2)
Total Protein: 7.5 g/dL (ref 6.5–8.1)

## 2021-05-31 LAB — RAPID URINE DRUG SCREEN, HOSP PERFORMED
Amphetamines: NOT DETECTED
Barbiturates: NOT DETECTED
Benzodiazepines: NOT DETECTED
Cocaine: NOT DETECTED
Opiates: NOT DETECTED
Tetrahydrocannabinol: NOT DETECTED

## 2021-05-31 LAB — DIFFERENTIAL
Abs Immature Granulocytes: 0.03 10*3/uL (ref 0.00–0.07)
Basophils Absolute: 0.1 10*3/uL (ref 0.0–0.1)
Basophils Relative: 1 %
Eosinophils Absolute: 0.1 10*3/uL (ref 0.0–0.5)
Eosinophils Relative: 1 %
Immature Granulocytes: 0 %
Lymphocytes Relative: 16 %
Lymphs Abs: 1.2 10*3/uL (ref 0.7–4.0)
Monocytes Absolute: 0.5 10*3/uL (ref 0.1–1.0)
Monocytes Relative: 7 %
Neutro Abs: 5.6 10*3/uL (ref 1.7–7.7)
Neutrophils Relative %: 75 %

## 2021-05-31 LAB — APTT: aPTT: 28 seconds (ref 24–36)

## 2021-05-31 LAB — URINALYSIS, ROUTINE W REFLEX MICROSCOPIC
Bilirubin Urine: NEGATIVE
Glucose, UA: NEGATIVE mg/dL
Hgb urine dipstick: NEGATIVE
Ketones, ur: NEGATIVE mg/dL
Nitrite: POSITIVE — AB
Protein, ur: NEGATIVE mg/dL
Specific Gravity, Urine: 1.01 (ref 1.005–1.030)
pH: 7 (ref 5.0–8.0)

## 2021-05-31 LAB — CBG MONITORING, ED: Glucose-Capillary: 113 mg/dL — ABNORMAL HIGH (ref 70–99)

## 2021-05-31 LAB — RESP PANEL BY RT-PCR (FLU A&B, COVID) ARPGX2
Influenza A by PCR: NEGATIVE
Influenza B by PCR: NEGATIVE
SARS Coronavirus 2 by RT PCR: NEGATIVE

## 2021-05-31 LAB — HEMOGLOBIN A1C
Hgb A1c MFr Bld: 5.6 % (ref 4.8–5.6)
Mean Plasma Glucose: 114.02 mg/dL

## 2021-05-31 LAB — CBC
HCT: 35.6 % — ABNORMAL LOW (ref 36.0–46.0)
Hemoglobin: 12.1 g/dL (ref 12.0–15.0)
MCH: 31 pg (ref 26.0–34.0)
MCHC: 34 g/dL (ref 30.0–36.0)
MCV: 91.3 fL (ref 80.0–100.0)
Platelets: 244 10*3/uL (ref 150–400)
RBC: 3.9 MIL/uL (ref 3.87–5.11)
RDW: 12.7 % (ref 11.5–15.5)
WBC: 7.5 10*3/uL (ref 4.0–10.5)
nRBC: 0 % (ref 0.0–0.2)

## 2021-05-31 LAB — PROTIME-INR
INR: 1.1 (ref 0.8–1.2)
Prothrombin Time: 13.9 seconds (ref 11.4–15.2)

## 2021-05-31 LAB — TSH: TSH: 1.682 u[IU]/mL (ref 0.350–4.500)

## 2021-05-31 LAB — ETHANOL: Alcohol, Ethyl (B): <10 mg/dL (ref <10)

## 2021-05-31 LAB — I-STAT CHEM 8, ED
BUN: 6 mg/dL (ref 6–20)
Calcium, Ion: 1.17 mmol/L (ref 1.15–1.40)
Chloride: 102 mmol/L (ref 98–111)
Creatinine, Ser: 0.7 mg/dL (ref 0.44–1.00)
Glucose, Bld: 105 mg/dL — ABNORMAL HIGH (ref 70–99)
HCT: 38 % (ref 36.0–46.0)
Hemoglobin: 12.9 g/dL (ref 12.0–15.0)
Potassium: 3.8 mmol/L (ref 3.5–5.1)
Sodium: 141 mmol/L (ref 135–145)
TCO2: 29 mmol/L (ref 22–32)

## 2021-05-31 MED ORDER — BUPRENORPHINE HCL-NALOXONE HCL 2-0.5 MG SL SUBL: 1.0000 | SUBLINGUAL_TABLET | Freq: Every day | SUBLINGUAL | Status: DC

## 2021-05-31 MED ORDER — POLYETHYLENE GLYCOL 3350 17 G PO PACK: 17.0000 g | PACK | Freq: Every day | ORAL | Status: DC | PRN

## 2021-05-31 MED ORDER — LABETALOL HCL 5 MG/ML IV SOLN
INTRAVENOUS | Status: AC
  Filled 2021-05-31: qty 4

## 2021-05-31 MED ORDER — SODIUM CHLORIDE 0.9% FLUSH: 3.0000 mL | INTRAVENOUS | Status: DC | PRN

## 2021-05-31 MED ORDER — ASPIRIN 81 MG PO TBEC
81.0000 mg | DELAYED_RELEASE_TABLET | Freq: Every day | ORAL | Status: DC
  Administered 2021-06-01: 81 mg via ORAL
  Filled 2021-05-31: qty 1

## 2021-05-31 MED ORDER — CEFTRIAXONE SODIUM 1 G IJ SOLR
1.0000 g | INTRAMUSCULAR | Status: DC
  Administered 2021-06-01: 1 g via INTRAVENOUS
  Filled 2021-05-31: qty 10

## 2021-05-31 MED ORDER — ACETAMINOPHEN 325 MG PO TABS: 650.0000 mg | ORAL_TABLET | Freq: Four times a day (QID) | ORAL | Status: DC | PRN

## 2021-05-31 MED ORDER — ONDANSETRON HCL 4 MG PO TABS: 4.0000 mg | ORAL_TABLET | Freq: Four times a day (QID) | ORAL | Status: DC | PRN

## 2021-05-31 MED ORDER — SODIUM CHLORIDE 0.9% FLUSH
3.0000 mL | Freq: Two times a day (BID) | INTRAVENOUS | Status: DC
  Administered 2021-05-31 – 2021-06-01 (×2): 3 mL via INTRAVENOUS

## 2021-05-31 MED ORDER — BACLOFEN 10 MG PO TABS
10.0000 mg | ORAL_TABLET | Freq: Three times a day (TID) | ORAL | Status: DC
  Administered 2021-05-31 – 2021-06-01 (×3): 10 mg via ORAL
  Filled 2021-05-31 (×3): qty 1

## 2021-05-31 MED ORDER — LURASIDONE HCL 20 MG PO TABS
20.0000 mg | ORAL_TABLET | Freq: Every evening | ORAL | Status: DC
  Filled 2021-05-31 (×3): qty 1

## 2021-05-31 MED ORDER — TENECTEPLASE FOR STROKE
PACK | INTRAVENOUS | Status: AC
  Filled 2021-05-31: qty 10

## 2021-05-31 MED ORDER — ACETAMINOPHEN 650 MG RE SUPP: 650.0000 mg | Freq: Four times a day (QID) | RECTAL | Status: DC | PRN

## 2021-05-31 MED ORDER — BUPRENORPHINE HCL-NALOXONE HCL 2-0.5 MG SL SUBL
2.0000 | SUBLINGUAL_TABLET | Freq: Four times a day (QID) | SUBLINGUAL | Status: DC
  Administered 2021-05-31 – 2021-06-01 (×3): 2 via SUBLINGUAL
  Filled 2021-05-31: qty 2
  Filled 2021-05-31: qty 1
  Filled 2021-05-31: qty 2
  Filled 2021-05-31: qty 1

## 2021-05-31 MED ORDER — TRAZODONE HCL 50 MG PO TABS: 50.0000 mg | ORAL_TABLET | Freq: Every evening | ORAL | Status: DC | PRN

## 2021-05-31 MED ORDER — LEVETIRACETAM 500 MG PO TABS
500.0000 mg | ORAL_TABLET | Freq: Two times a day (BID) | ORAL | Status: DC
Start: 2021-05-31 — End: 2021-06-01
  Administered 2021-05-31: 500 mg via ORAL
  Filled 2021-05-31: qty 1

## 2021-05-31 MED ORDER — CLOPIDOGREL BISULFATE 75 MG PO TABS
75.0000 mg | ORAL_TABLET | Freq: Every day | ORAL | Status: DC
  Administered 2021-05-31 – 2021-06-01 (×2): 75 mg via ORAL
  Filled 2021-05-31 (×2): qty 1

## 2021-05-31 MED ORDER — ONDANSETRON HCL 4 MG/2ML IJ SOLN: 4.0000 mg | Freq: Four times a day (QID) | INTRAMUSCULAR | Status: DC | PRN

## 2021-05-31 MED ORDER — BISACODYL 10 MG RE SUPP: 10.0000 mg | Freq: Every day | RECTAL | Status: DC | PRN

## 2021-05-31 MED ORDER — ATORVASTATIN CALCIUM 40 MG PO TABS
40.0000 mg | ORAL_TABLET | Freq: Every day | ORAL | Status: DC
Start: 2021-05-31 — End: 2021-06-01
  Administered 2021-05-31: 40 mg via ORAL
  Filled 2021-05-31: qty 1

## 2021-05-31 MED ORDER — ENOXAPARIN SODIUM 40 MG/0.4ML IJ SOSY
40.0000 mg | PREFILLED_SYRINGE | INTRAMUSCULAR | Status: DC
  Administered 2021-05-31: 40 mg via SUBCUTANEOUS
  Filled 2021-05-31: qty 0.4

## 2021-05-31 MED ORDER — UMECLIDINIUM-VILANTEROL 62.5-25 MCG/ACT IN AEPB
1.0000 | INHALATION_SPRAY | Freq: Every day | RESPIRATORY_TRACT | Status: DC
  Administered 2021-06-01: 1 via RESPIRATORY_TRACT
  Filled 2021-05-31: qty 14

## 2021-05-31 MED ORDER — GABAPENTIN 100 MG PO CAPS
100.0000 mg | ORAL_CAPSULE | Freq: Two times a day (BID) | ORAL | Status: DC
  Administered 2021-05-31 – 2021-06-01 (×2): 100 mg via ORAL
  Filled 2021-05-31 (×2): qty 1

## 2021-05-31 MED ORDER — SODIUM CHLORIDE 0.9 % IV SOLN
1.0000 g | Freq: Once | INTRAVENOUS | Status: AC
  Administered 2021-05-31: 1 g via INTRAVENOUS
  Filled 2021-05-31: qty 10

## 2021-05-31 MED ORDER — SODIUM CHLORIDE 0.9% FLUSH: 3.0000 mL | Freq: Two times a day (BID) | INTRAVENOUS | Status: DC

## 2021-05-31 MED ORDER — IOHEXOL 350 MG/ML SOLN
75.0000 mL | Freq: Once | INTRAVENOUS | Status: AC | PRN
  Administered 2021-05-31: 100 mL via INTRAVENOUS

## 2021-05-31 MED ORDER — SODIUM CHLORIDE 0.9 % IV SOLN: 250.0000 mL | INTRAVENOUS | Status: DC | PRN

## 2021-05-31 NOTE — Progress Notes (Signed)
Code stroke activated per elert @ 1009.  Dr Amada Jupiter paged @1013 , on camera to assess patient @1016 .  No TNK, call ended @1030 

## 2021-05-31 NOTE — Progress Notes (Signed)
Code Stroke time documentation  1010 Call time 1010 Beeper time 1012 Exam started 1016 Exam finished 1020 Images sent to [facility] 1020 Exam completed in [facility] 1021 [facility] called

## 2021-05-31 NOTE — ED Notes (Signed)
AC notified of need for Latuda and Suboxone from pharmacy.

## 2021-05-31 NOTE — ED Triage Notes (Signed)
Pt brought to ED via [facility] for increased in slurred speech since 0715 this morning. Pt woke up at 0700, LKW 0700. Pt with slurred speech more than normal. States she was normal at 0700, got up and walked then reached for her coffee and noticed right arm weakness.
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
## 2021-05-31 NOTE — Consult Note (Signed)
[facility] Telemedicine Consult   Requesting Provider: Robyn Haber Consult Participants: Patient   This consult was provided via telemedicine with 2-way video and audio communication. The patient/family was informed that care would be provided in this way and agreed to receive care in this manner.    Chief Complaint: Dysarthria  HPI: Patient was in her normal state of health when she went to bed last night.  When she first got out of bed, she felt noticeably more woozy, but felt like she was speaking still fairly clearly to her dogs.  Over the course of the next half hour, she started noticing that she was speaking slightly differently.  She also noticed when she was reaching for something that her hand went to the side of it.  Due to the symptoms she sought care in the emergency department today.    LKW: 5/20 prior to bed tpa given?: No, outside of window IR Thrombectomy? No, no signs of LVO  Exam: There were no vitals filed for this visit.  General: In bed, NAD  1A: Level of Consciousness - 0 1B: Ask Month and Age - 1 1C: 'Blink Eyes' & 'Squeeze Hands' - 0 2: Test Horizontal Extraocular Movements - 0 3: Test Visual Fields - 0 4: Test Facial Palsy - 1 5A: Test Left Arm Motor Drift - 0 5B: Test Right Arm Motor Drift - 0 6A: Test Left Leg Motor Drift - 1 6B: Test Right Leg Motor Drift - 0 7: Test Limb Ataxia - 0 8: Test Sensation - 0 9: Test Language/Aphasia- 0 10: Test Dysarthria - 1 11: Test Extinction/Inattention - 0 NIHSS score: Four   Imaging Reviewed: CT head-negative  Labs reviewed in epic and pertinent values follow: CBG 113   Assessment: 56 year old female with dysarthria and unsteadiness that has been worsening over the course of the morning.  I do not think that she is a candidate for tenecteplase at this time given the fact that she was already feeling unsteady when she first woke up.  I do think ischemic stroke is a possibility, and an MRI will  need to be performed.  She has had similar presentations in the past with negative work-up, and I think that nonorganic symptoms is a possibility, but this would be a diagnosis of exclusion.  Recommendations:  - HgbA1c, fasting lipid panel - MRI of the brain without contrast - Frequent neuro checks - Echocardiogram - CTA head and neck - Prophylactic therapy-Antiplatelet med: Aspirin - dose 81mg  and plavix 75mg  daily after 300mg  load  - Risk factor modification - Telemetry monitoring - PT consult, OT consult, Speech consult    Roland Rack, MD [facility] [phone]  If 7pm- 7am, please page neurology on call as listed in AMION.

## 2021-05-31 NOTE — ED Provider Notes (Signed)
[facility] EMERGENCY DEPARTMENT Provider Note   CSN: [id number] Arrival date & time: 05/31/21  1005  An emergency department physician performed an initial assessment on this suspected stroke patient at 1008.  History  Chief Complaint  Patient presents with   Code Stroke    Alice [name] is a 56 y.o. female.  HPI Patient presents with possible stroke.  Reportedly got up at 7 AM and was normal.  At 715 went to get a coffee and then noticed she had difficulty moving her right arm.  States her hand went to the wrong spot.  Also difficulty speaking.  States normally from a previous stroke has some tingling in her feet and has a mild lisp but not like now.  Face appears somewhat asymmetric.  And does have dysarthria.  No headache.  No confusion.  Sugar reassuring for EMS.    Home Medications Prior to Admission medications   Medication Sig Start Date End Date Taking? Authorizing Provider  albuterol (VENTOLIN HFA) 108 (90 Base) MCG/ACT inhaler Inhale 2 puffs into the lungs every 6 (six) hours as needed for wheezing or shortness of breath. 10/29/20  Yes Shon Hale, MD  aspirin EC 81 MG EC tablet Take 1 tablet (81 mg total) by mouth daily with breakfast. For Stroke Prevention 10/29/20  Yes Emokpae, Courage, MD  baclofen (LIORESAL) 10 MG tablet Take 1 tablet (10 mg total) by mouth 3 (three) times daily. 02/25/21  Yes Daryll Drown, NP  gabapentin (NEURONTIN) 100 MG capsule Take 100 mg by mouth 2 (two) times daily. 01/13/21  Yes [provider]  LATUDA 20 MG TABS tablet Take 20 mg by mouth every evening. 05/12/21  Yes [provider]  lisinopril (ZESTRIL) 10 MG tablet Take 1 tablet (10 mg total) by mouth daily. 12/12/20  Yes Daryll Drown, NP  SUBOXONE 8-2 MG FILM Take 0.5 Film by mouth 4 (four) times daily. Dissolve 0.5 films under tongue 4 times a day 10/15/20  Yes [provider]  umeclidinium-vilanterol (ANORO ELLIPTA) 62.5-25 MCG/ACT AEPB Inhale 1 puff into the  lungs daily. For COPD 10/29/20  Yes Emokpae, Courage, MD  atorvastatin (LIPITOR) 40 MG tablet Take 1 tablet (40 mg total) by mouth daily. Patient not taking: Reported on 05/31/2021 10/29/20 10/29/21  Shon Hale, MD  levETIRAcetam (KEPPRA) 500 MG tablet Take 1 tablet (500 mg total) by mouth 2 (two) times daily. Patient not taking: Reported on 05/31/2021 10/29/20   Shon Hale, MD      Allergies    Benadryl [diphenhydramine hcl], Codeine, and Tetracycline    Review of Systems   Review of Systems  Constitutional:  Negative for appetite change.  Respiratory:  Negative for shortness of breath.   Gastrointestinal:  Negative for abdominal pain.  Neurological:  Positive for speech difficulty.   Physical Exam Updated Vital Signs BP 117/83   Pulse 71   Temp 98.2 F (36.8 C) (Oral)   Resp 18   Wt 95.7 kg   LMP 07/30/2011   SpO2 98%   BMI 30.26 kg/m  Physical Exam Vitals and nursing note reviewed.  Eyes:     Extraocular Movements: Extraocular movements intact.     Pupils: Pupils are equal, round, and reactive to light.     Comments: Visual fields intact by confrontation.  Neurological:     Mental Status: She is alert.     Comments: Face somewhat asymmetric.  Potential left facial droop.  Visual fields intact..  Finger-nose intact bilaterally.  Dysarthria.  ED Results / Procedures / Treatments   Labs (all labs ordered are listed, but only abnormal results are displayed) Labs Reviewed  CBC - Abnormal; Notable for the following components:      Result Value   HCT 35.6 (*)    All other components within normal limits  COMPREHENSIVE METABOLIC PANEL - Abnormal; Notable for the following components:   Glucose, Bld 110 (*)    All other components within normal limits  URINALYSIS, ROUTINE W REFLEX MICROSCOPIC - Abnormal; Notable for the following components:   APPearance HAZY (*)    Nitrite POSITIVE (*)    Leukocytes,Ua MODERATE (*)    Bacteria, UA MANY (*)    All other  components within normal limits  I-STAT CHEM 8, ED - Abnormal; Notable for the following components:   Glucose, Bld 105 (*)    All other components within normal limits  CBG MONITORING, ED - Abnormal; Notable for the following components:   Glucose-Capillary 113 (*)    All other components within normal limits  RESP PANEL BY RT-PCR (FLU A&B, COVID) ARPGX2  URINE CULTURE  ETHANOL  PROTIME-INR  APTT  DIFFERENTIAL  RAPID URINE DRUG SCREEN, HOSP PERFORMED  TSH  HEMOGLOBIN A1C    EKG None  Radiology CT HEAD CODE STROKE WO CONTRAST  Addendum Date: 05/31/2021   ADDENDUM REPORT: 05/31/2021 10:50 ADDENDUM: Study discussed by telephone with Dr. Benjiman Core on 05/31/2021 at 1047 hours. Electronically Signed   By: Odessa Fleming M.D.   On: 05/31/2021 10:50   Result Date: 05/31/2021 CLINICAL DATA:  Code stroke. 56 year old female difficulty speaking. EXAM: CT HEAD WITHOUT CONTRAST TECHNIQUE: Contiguous axial images were obtained from the base of the skull through the vertex without intravenous contrast. RADIATION DOSE REDUCTION: This exam was performed according to the departmental dose-optimization program which includes automated exposure control, adjustment of the mA and/or kV according to patient size and/or use of iterative reconstruction technique. COMPARISON:  Brain MRI 10/27/2020. CTA head and neck 10/27/2020. FINDINGS: Brain: Stable noncontrast CT appearance of the brain since last year. Small area of chronic subcortical white matter encephalomalacia in the left parietal lobe better demonstrated by MRI. No superimposed No midline shift, ventriculomegaly, mass effect, evidence of mass lesion, intracranial hemorrhage or evidence of cortically based acute infarction. Vascular: Mild Calcified atherosclerosis at the skull base. No suspicious intracranial vascular hyperdensity. Skull: Stable, negative. Sinuses/Orbits: Visualized paranasal sinuses and mastoids are clear. Other: No acute orbit or scalp  soft tissue finding identified. ASPECTS [facility] Stroke Program Early CT Score) Total score (0-10 with 10 being normal): 10 IMPRESSION: 1. No acute cortically based infarct or acute intracranial hemorrhage identified. ASPECTS 10. 2. Stable noncontrast CT appearance of the brain since last year with patchy left parietal lobe encephalomalacia. Electronically Signed: By: Odessa Fleming M.D. On: 05/31/2021 10:32   CT ANGIO HEAD NECK W WO CM W PERF (CODE STROKE)  Result Date: 05/31/2021 CLINICAL DATA:  Neuro deficit, acute, stroke suspected; slurred speech EXAM: CT ANGIOGRAPHY HEAD AND NECK CT PERFUSION BRAIN TECHNIQUE: Multidetector CT imaging of the head and neck was performed using the standard protocol during bolus administration of intravenous contrast. Multiplanar CT image reconstructions and MIPs were obtained to evaluate the vascular anatomy. Carotid stenosis measurements (when applicable) are obtained utilizing NASCET criteria, using the distal internal carotid diameter as the denominator. Multiphase CT imaging of the brain was performed following IV bolus contrast injection. Subsequent parametric perfusion maps were calculated using RAPID software. RADIATION DOSE REDUCTION: This exam was performed  according to the departmental dose-optimization program which includes automated exposure control, adjustment of the mA and/or kV according to patient size and/or use of iterative reconstruction technique. CONTRAST:  OMNIPAQUE IOHEXOL 350 MG/ML SOLN COMPARISON:  10/27/2020 FINDINGS: CTA NECK Aortic arch: Minimal calcified plaque. Great vessel origins are patent. Right carotid system: Patent. Unchanged minimal calcified plaque at the ICA origin. No stenosis. Left carotid system: Patent. Unchanged minimal calcified plaque at the ICA origin. No stenosis. Vertebral arteries: Patent. Left vertebral artery is dominant. No stenosis. Skeleton: Similar appearance of degenerative changes of the spine. Chronic deformity of  C7. Chronic T4 compression fracture. Other neck: Unremarkable. Upper chest: No apical lung mass. Review of the MIP images confirms the above findings CTA HEAD Anterior circulation: Intracranial internal carotid arteries are patent with minor calcified plaque. Anterior and middle cerebral arteries are patent. Posterior circulation: Intracranial vertebral arteries are patent. Basilar artery is patent. Major cerebellar artery origins are patent. Bilateral posterior communicating arteries are present. Posterior cerebral arteries are patent. Venous sinuses: Patent as allowed by contrast bolus timing. Review of the MIP images confirms the above findings CT Brain Perfusion Findings: CBF (<30%) Volume: 61mL Perfusion (Tmax>6.0s) volume: 51mL Mismatch Volume: 4mL Infarction Location: None. IMPRESSION: No large vessel occlusion, hemodynamically significant stenosis, or evidence of dissection. Perfusion imaging demonstrates no evidence of core infarction or penumbra. Electronically Signed   By: Guadlupe Spanish M.D.   On: 05/31/2021 11:35    Procedures Procedures    Medications Ordered in ED Medications  labetalol (NORMODYNE) 5 MG/ML injection (  Not Given 05/31/21 1033)  tenecteplase (TNKASE) 50 MG injection for Stroke (  Not Given 05/31/21 1033)  atorvastatin (LIPITOR) tablet 40 mg (40 mg Oral Given 05/31/21 1323)  aspirin EC tablet 81 mg (has no administration in time range)  clopidogrel (PLAVIX) tablet 75 mg (75 mg Oral Given 05/31/21 1323)  iohexol (OMNIPAQUE) 350 MG/ML injection 75 mL (100 mLs Intravenous Contrast Given 05/31/21 1113)  cefTRIAXone (ROCEPHIN) 1 g in sodium chloride 0.9 % 100 mL IVPB (0 g Intravenous Stopped 05/31/21 1355)    ED Course/ Medical Decision Making/ A&P                           Medical Decision Making Amount and/or Complexity of Data Reviewed Labs: ordered. Radiology: ordered.  Risk Prescription drug management. Decision regarding hospitalization.   Patient presented with  difficulty speaking.  Also states difficulty moving her right arm.  Patient told me that she got up at 7 AM and was normal then at 715 developed issues however told neurology that she was feeling unsteady when she woke up.  Initial code stroke been called with the initial time of 715.  Head CT reassuring.  Exam does show dysarthria and may be some facial asymmetry.  Patient states she normally does not have any unsteadiness or weakness on the right but reviewing note from previous admission in October may have some chronic right-sided deficits.  Seen by teleneurology by Dr. Amada Jupiter.  Not a TNK candidate.  Does not LVO positive but added CTA with perfusion which was negative.  States patient will require admission to the hospital for work-up.  Can get MRI tomorrow.  Lab work reviewed and Show likely urinary tract infection.  Will treat.  Potentially could have worsened other baseline deficits.  Will discuss with hospitalist for admission.  CRITICAL CARE Performed by: Benjiman Core Total critical care time: 30 minutes Critical care time was  exclusive of separately billable procedures and treating other patients. Critical care was necessary to treat or prevent imminent or life-threatening deterioration. Critical care was time spent personally by me on the following activities: development of treatment plan with patient and/or surrogate as well as nursing, discussions with consultants, evaluation of patient's response to treatment, examination of patient, obtaining history from patient or surrogate, ordering and performing treatments and interventions, ordering and review of laboratory studies, ordering and review of radiographic studies, pulse oximetry and re-evaluation of patient's condition.        Final Clinical Impression(s) / ED Diagnoses Final diagnoses:  Dysarthria  Acute cystitis without hematuria    Rx / DC Orders ED Discharge Orders     None         Benjiman CorePickering, Tichina Koebel,  MD 05/31/21 [phone]

## 2021-05-31 NOTE — Progress Notes (Signed)
Continued modified stroke scale and vitals q2 hours upon arrival to unit.

## 2021-05-31 NOTE — ED Notes (Signed)
MD at bedside. 

## 2021-05-31 NOTE — ED Notes (Signed)
Code Stroke paged out. CT notified.  °

## 2021-06-01 ENCOUNTER — Observation Stay (HOSPITAL_COMMUNITY): Payer: Medicaid Other

## 2021-06-01 DIAGNOSIS — I63339 Cerebral infarction due to thrombosis of unspecified posterior cerebral artery: Secondary | ICD-10-CM | POA: Diagnosis not present

## 2021-06-01 DIAGNOSIS — I619 Nontraumatic intracerebral hemorrhage, unspecified: Secondary | ICD-10-CM | POA: Diagnosis not present

## 2021-06-01 DIAGNOSIS — Z8673 Personal history of transient ischemic attack (TIA), and cerebral infarction without residual deficits: Secondary | ICD-10-CM | POA: Diagnosis not present

## 2021-06-01 DIAGNOSIS — G459 Transient cerebral ischemic attack, unspecified: Secondary | ICD-10-CM | POA: Diagnosis not present

## 2021-06-01 LAB — LIPID PANEL
Cholesterol: 172 mg/dL (ref 0–200)
HDL: 40 mg/dL — ABNORMAL LOW (ref >40)
LDL Cholesterol: 112 mg/dL — ABNORMAL HIGH (ref 0–99)
Total CHOL/HDL Ratio: 4.3 RATIO
Triglycerides: 98 mg/dL (ref <150)
VLDL: 20 mg/dL (ref 0–40)

## 2021-06-01 MED ORDER — ALBUTEROL SULFATE HFA 108 (90 BASE) MCG/ACT IN AERS: 2.0000 | INHALATION_SPRAY | Freq: Four times a day (QID) | RESPIRATORY_TRACT | 2 refills | Status: DC | PRN

## 2021-06-01 MED ORDER — CLOPIDOGREL BISULFATE 75 MG PO TABS: 75.0000 mg | ORAL_TABLET | Freq: Every day | ORAL | 0 refills | Status: AC

## 2021-06-01 MED ORDER — ASPIRIN 81 MG PO TBEC: 81.0000 mg | DELAYED_RELEASE_TABLET | Freq: Every day | ORAL | 11 refills | Status: DC

## 2021-06-01 MED ORDER — ATORVASTATIN CALCIUM 80 MG PO TABS: 80.0000 mg | ORAL_TABLET | Freq: Every day | ORAL | 11 refills | Status: DC

## 2021-06-01 MED ORDER — ATORVASTATIN CALCIUM 40 MG PO TABS: 80.0000 mg | ORAL_TABLET | Freq: Every day | ORAL | Status: DC

## 2021-06-01 MED ORDER — UMECLIDINIUM-VILANTEROL 62.5-25 MCG/ACT IN AEPB: 1.0000 | INHALATION_SPRAY | Freq: Every day | RESPIRATORY_TRACT | 3 refills | Status: DC

## 2021-06-01 MED ORDER — SODIUM CHLORIDE 0.9 % IV BOLUS
500.0000 mL | Freq: Once | INTRAVENOUS | Status: AC
  Administered 2021-06-01: 500 mL via INTRAVENOUS

## 2021-06-01 MED ORDER — CEPHALEXIN 500 MG PO CAPS: 500.0000 mg | ORAL_CAPSULE | Freq: Three times a day (TID) | ORAL | 0 refills | Status: AC

## 2021-06-01 NOTE — Progress Notes (Signed)
Patient recommended for HHPT. Referred to [facility], [facility], [facility], [facility], [facility], [facility], all declined due to patient's insurance being Medicaid. OPPT in [place] ordered. Patient wants to go to a facility. Advised that she would have to give up her check to go to a facility (SNF, ALF, [facility]). Patient states that she has bills and cannot give up her check to go to a facility. Patient lives with son-in-law who works 12 hour days per Ms. Bell, patient's sister.  TOC signing off.     Haleemah Buckalew, Juleen China, LCSW

## 2021-06-01 NOTE — Progress Notes (Signed)
STROKE TEAM PROGRESS NOTE   SUBJECTIVE (INTERVAL HISTORY) Dr. Marisa Severin is at the bedside.  Overall her condition is stable.  Patient still complaining of worsening slurred speech and right arm clumsiness than her baseline.  Stated that she is still feel loopy and not fully herself.  She denies any recent drug use.  PT has worked with her and recommend home health.  OBJECTIVE Temp:  [98.2 F (36.8 C)-98.5 F (36.9 C)] 98.3 F (36.8 C) (05/22 1135) Pulse Rate:  [56-72] 64 (05/22 1135) Cardiac Rhythm: Normal sinus rhythm (05/22 0830) Resp:  [11-20] 20 (05/22 1135) BP: (87-124)/(56-87) 124/82 (05/22 1135) SpO2:  [94 %-100 %] 97 % (05/22 1135) Weight:  [89.6 kg] 89.6 kg (05/21 2029)  Recent Labs  Lab 05/31/21 1009  GLUCAP 113*   Recent Labs  Lab 05/31/21 1032 05/31/21 1033  NA 141 140  K 3.8 3.7  CL 102 106  CO2  --  27  GLUCOSE 105* 110*  BUN 6 9  CREATININE 0.70 0.67  CALCIUM  --  9.2   Recent Labs  Lab 05/31/21 1033  AST 16  ALT 11  ALKPHOS 78  BILITOT 0.4  PROT 7.5  ALBUMIN 4.1   Recent Labs  Lab 05/31/21 1032 05/31/21 1033  WBC  --  7.5  NEUTROABS  --  5.6  HGB 12.9 12.1  HCT 38.0 35.6*  MCV  --  91.3  PLT  --  244   No results for input(s): CKTOTAL, CKMB, CKMBINDEX, TROPONINI in the last 168 hours. Recent Labs    05/31/21 1033  LABPROT 13.9  INR 1.1   Recent Labs    05/31/21 1010  COLORURINE YELLOW  LABSPEC 1.010  PHURINE 7.0  GLUCOSEU NEGATIVE  HGBUR NEGATIVE  BILIRUBINUR NEGATIVE  KETONESUR NEGATIVE  PROTEINUR NEGATIVE  NITRITE POSITIVE*  LEUKOCYTESUR MODERATE*       Component Value Date/Time   CHOL 172 06/01/2021 0419   TRIG 98 06/01/2021 0419   HDL 40 (L) 06/01/2021 0419   CHOLHDL 4.3 06/01/2021 0419   VLDL 20 06/01/2021 0419   LDLCALC 112 (H) 06/01/2021 0419   Lab Results  Component Value Date   HGBA1C 5.6 05/31/2021      Component Value Date/Time   LABOPIA NONE DETECTED 05/31/2021 1010   COCAINSCRNUR NONE DETECTED  05/31/2021 1010   COCAINSCRNUR Negative 09/04/2016 0935   LABBENZ NONE DETECTED 05/31/2021 1010   AMPHETMU NONE DETECTED 05/31/2021 1010   THCU NONE DETECTED 05/31/2021 1010   LABBARB NONE DETECTED 05/31/2021 1010    Recent Labs  Lab 05/31/21 1009  ETH <10    I have personally reviewed the radiological images below and agree with the radiology interpretations.  MR BRAIN WO CONTRAST  Result Date: 06/01/2021 CLINICAL DATA:  Provided history: Transient ischemic attack. EXAM: MRI HEAD WITHOUT CONTRAST TECHNIQUE: Multiplanar, multiecho pulse sequences of the brain and surrounding structures were obtained without intravenous contrast. COMPARISON:  Noncontrast head CT 05/31/2021. CT angiogram head/neck 05/31/2021. Brain MRI 10/27/2020. FINDINGS: Mild-to-moderate intermittent motion degradation. Brain: No age advanced or lobar predominant parenchymal atrophy. Multiple small chronic cortical infarcts within the left parietal lobe, unchanged from the prior brain MRI of 10/27/2020. Chronic hemosiderin deposition associated with some of these infarcts. Multifocal T2 FLAIR hyperintense signal abnormality within the cerebral white matter, nonspecific but compatible with mild chronic small vessel ischemic disease. Redemonstrated prominent perivascular space versus chronic lacunar infarct within the posterior limb of left internal capsule (series 10, image 10). Scattered supratentorial chronic parenchymal microhemorrhages. There  is no acute infarct. No evidence of an intracranial mass. No extra-axial fluid collection. No midline shift. Vascular: Maintained flow voids within the proximal large arterial vessels. Skull and upper cervical spine: No focal suspicious marrow lesion. Incompletely assessed cervical spondylosis. Sinuses/Orbits: No mass or acute finding within the imaged orbits. No significant paranasal sinus disease. IMPRESSION: Intermittently motion degraded exam. No evidence of acute intracranial  abnormality. Redemonstrated small chronic cortical infarcts within the left parietal lobe. Mild chronic small vessel ischemic changes within the cerebral white matter, unchanged from the prior brain MRI of 10/27/2020. Redemonstrated prominent perivascular space versus chronic lacunar infarct within the posterior limb of left internal capsule. As before, there are scattered chronic microhemorrhages within the supratentorial brain. Electronically Signed   By: Jackey Loge D.O.   On: 06/01/2021 10:55   CT HEAD CODE STROKE WO CONTRAST  Addendum Date: 05/31/2021   ADDENDUM REPORT: 05/31/2021 10:50 ADDENDUM: Study discussed by telephone with Dr. Benjiman Core on 05/31/2021 at 1047 hours. Electronically Signed   By: Odessa Fleming M.D.   On: 05/31/2021 10:50   Result Date: 05/31/2021 CLINICAL DATA:  Code stroke. 56 year old female difficulty speaking. EXAM: CT HEAD WITHOUT CONTRAST TECHNIQUE: Contiguous axial images were obtained from the base of the skull through the vertex without intravenous contrast. RADIATION DOSE REDUCTION: This exam was performed according to the departmental dose-optimization program which includes automated exposure control, adjustment of the mA and/or kV according to patient size and/or use of iterative reconstruction technique. COMPARISON:  Brain MRI 10/27/2020. CTA head and neck 10/27/2020. FINDINGS: Brain: Stable noncontrast CT appearance of the brain since last year. Small area of chronic subcortical white matter encephalomalacia in the left parietal lobe better demonstrated by MRI. No superimposed No midline shift, ventriculomegaly, mass effect, evidence of mass lesion, intracranial hemorrhage or evidence of cortically based acute infarction. Vascular: Mild Calcified atherosclerosis at the skull base. No suspicious intracranial vascular hyperdensity. Skull: Stable, negative. Sinuses/Orbits: Visualized paranasal sinuses and mastoids are clear. Other: No acute orbit or scalp soft tissue  finding identified. ASPECTS [facility] Stroke Program Early CT Score) Total score (0-10 with 10 being normal): 10 IMPRESSION: 1. No acute cortically based infarct or acute intracranial hemorrhage identified. ASPECTS 10. 2. Stable noncontrast CT appearance of the brain since last year with patchy left parietal lobe encephalomalacia. Electronically Signed: By: Odessa Fleming M.D. On: 05/31/2021 10:32   CT ANGIO HEAD NECK W WO CM W PERF (CODE STROKE)  Result Date: 05/31/2021 CLINICAL DATA:  Neuro deficit, acute, stroke suspected; slurred speech EXAM: CT ANGIOGRAPHY HEAD AND NECK CT PERFUSION BRAIN TECHNIQUE: Multidetector CT imaging of the head and neck was performed using the standard protocol during bolus administration of intravenous contrast. Multiplanar CT image reconstructions and MIPs were obtained to evaluate the vascular anatomy. Carotid stenosis measurements (when applicable) are obtained utilizing NASCET criteria, using the distal internal carotid diameter as the denominator. Multiphase CT imaging of the brain was performed following IV bolus contrast injection. Subsequent parametric perfusion maps were calculated using RAPID software. RADIATION DOSE REDUCTION: This exam was performed according to the departmental dose-optimization program which includes automated exposure control, adjustment of the mA and/or kV according to patient size and/or use of iterative reconstruction technique. CONTRAST:  OMNIPAQUE IOHEXOL 350 MG/ML SOLN COMPARISON:  10/27/2020 FINDINGS: CTA NECK Aortic arch: Minimal calcified plaque. Great vessel origins are patent. Right carotid system: Patent. Unchanged minimal calcified plaque at the ICA origin. No stenosis. Left carotid system: Patent. Unchanged minimal calcified plaque at the ICA  origin. No stenosis. Vertebral arteries: Patent. Left vertebral artery is dominant. No stenosis. Skeleton: Similar appearance of degenerative changes of the spine. Chronic deformity of C7. Chronic T4  compression fracture. Other neck: Unremarkable. Upper chest: No apical lung mass. Review of the MIP images confirms the above findings CTA HEAD Anterior circulation: Intracranial internal carotid arteries are patent with minor calcified plaque. Anterior and middle cerebral arteries are patent. Posterior circulation: Intracranial vertebral arteries are patent. Basilar artery is patent. Major cerebellar artery origins are patent. Bilateral posterior communicating arteries are present. Posterior cerebral arteries are patent. Venous sinuses: Patent as allowed by contrast bolus timing. Review of the MIP images confirms the above findings CT Brain Perfusion Findings: CBF (<30%) Volume: 0mL Perfusion (Tmax>6.0s) volume: 0mL Mismatch Volume: 0mL Infarction Location: None. IMPRESSION: No large vessel occlusion, hemodynamically significant stenosis, or evidence of dissection. Perfusion imaging demonstrates no evidence of core infarction or penumbra. Electronically Signed   By: Guadlupe SpanishPraneil  Patel M.D.   On: 05/31/2021 11:35     PHYSICAL EXAM  Temp:  [98.2 F (36.8 C)-98.5 F (36.9 C)] 98.3 F (36.8 C) (05/22 1135) Pulse Rate:  [56-72] 64 (05/22 1135) Resp:  [11-20] 20 (05/22 1135) BP: (87-124)/(56-87) 124/82 (05/22 1135) SpO2:  [94 %-100 %] 97 % (05/22 1135) Weight:  [89.6 kg] 89.6 kg (05/21 2029)  General - Well nourished, well developed, in no apparent distress.  Ophthalmologic - fundi not visualized due to noncooperation.  Cardiovascular - Regular rhythm and rate.  Mental Status -  Level of arousal and orientation to time, place, and person were intact. Language including expression, naming, repetition, comprehension was assessed and found intact.  Mild to moderate dysarthria Fund of Knowledge was assessed and was intact.   Cranial Nerves II - XII - II - Visual field intact OU. III, IV, VI - Extraocular movements intact. V - Facial sensation intact bilaterally. VII - Facial movement intact  bilaterally. VIII - Hearing & vestibular intact bilaterally. X - Palate elevates symmetrically.  Mild to moderate dysarthria XI - Chin turning & shoulder shrug intact bilaterally. XII - Tongue protrusion intact.  Motor Strength - The patient's strength was normal in all extremities and pronator drift was absent except some giveaway weakness on the right.  Bulk was normal and fasciculations were absent.   Motor Tone - Muscle tone was assessed at the neck and appendages and was normal.  Sensory - Light touch, temperature/pinprick were assessed and were subjectively decreased on the right.    Coordination - The patient had normal movements in the hands with no ataxia or dysmetria.  Tremor was absent.  Gait and Station - deferred.   ASSESSMENT/PLAN [name] is a 56 y.o. female with history of hypertension, hyperlipidemia, previous cocaine abuse, COPD, smoker, history of stroke admitted for worsening dysarthria and right hand clumsiness. No tPA given due to outside window.    Recrudescence of previous stroke symptoms in setting of ?  Mild UTI CT no acute abnormality CT head and neck unremarkable MRI no acute infarct, chronic small left parietal cortical infarcts 2D Echo EF 60 to 65% in 10/2020 LDL 112 HgbA1c 5.6 UDS negative Lovenox for VTE prophylaxis aspirin 81 mg daily prior to admission, now on aspirin 81 mg daily and clopidogrel 75 mg daily.  Okay to continue DAPT for 3 weeks and then ASA alone. Patient counseled to be compliant with her antithrombotic medications Ongoing aggressive stroke risk factor management Therapy recommendations: Home health Disposition: Pending  History of stroke 09/2015 admitted  for left arm weakness and numbness.  CT no acute abnormality.  UDS positive for cocaine and opiates.  MRI showed right pontine infarct.  CTA head and neck unremarkable.  EF 65 to 70%.  LDL of 115, A1c 5.5.  Discharged on aspirin 81 and Lipitor 40 08/2016 admitted for slurred  speech and left-sided numbness, difficulty walking.  CT negative.  Status post tPA.  CTA head and neck unremarkable.  MRI no acute infarct.  LDL 58, A1c 5.4.  UDS positive for cocaine again.  Discharged on aspirin 325 and Lipitor 20 10/2020 admitted again for right-sided weakness and slurred speech.  MRI showed no acute infarct but to old left parietal infarct.  CT head and neck negative.  EF 60 to 69%.  Dr. Gerilyn Pilgrim saw patient, concerning for MRI negative stroke versus Todd's paralysis.  Discharged on DAPT and Lipitor.  Also put on Keppra. However, patient stated that she has not taking Keppra at home.  Seizure less likely, okay not to restart at this time.  Hypertension Stable Long term BP goal normotensive  Hyperlipidemia Home meds: Lipitor 40 LDL 112, goal < 70 Now on Lipitor 80 Continue statin at discharge  ?  Mild UTI UA WBC 11-20 On Rocephin Management per primary team  Tobacco abuse Current smoker Smoking cessation counseling provided Pt is willing to quit  Other Stroke Risk Factors   Other Active Problems none  Hospital day # 0  Neurology will sign off. Please call with questions. Pt will follow up with Dr. Gerilyn Pilgrim in about 4 weeks. Thanks for the consult.   Marvel Plan, MD PhD Stroke Neurology 06/01/2021 12:17 PM  This consult was provided via telemedicine with 2-way video and audio communication. The patient/family was informed that care would be provided in this way and agreed to receive care in this manner.   This patient is receiving care for possible acute neurological changes. There was 35 minutes of care by this provider at the time of service, including time for direct evaluation via telemedicine, review of medical records, imaging studies and discussion of findings with providers, the patient and/or family. I discussed with Dr. Mariea Clonts  To contact Stroke Continuity provider, please refer to [email or web address]. After hours, contact General Neurology
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
## 2021-06-01 NOTE — Plan of Care (Signed)
  Problem: Education: °Goal: Knowledge of General Education information will improve °Description: Including pain rating scale, medication(s)/side effects and non-pharmacologic comfort measures °Outcome: Adequate for Discharge °  °Problem: Health Behavior/Discharge Planning: °Goal: Ability to manage health-related needs will improve °Outcome: Adequate for Discharge °  °Problem: Clinical Measurements: °Goal: Ability to maintain clinical measurements within normal limits will improve °Outcome: Adequate for Discharge °Goal: Will remain free from infection °Outcome: Adequate for Discharge °Goal: Diagnostic test results will improve °Outcome: Adequate for Discharge °Goal: Respiratory complications will improve °Outcome: Adequate for Discharge °Goal: Cardiovascular complication will be avoided °Outcome: Adequate for Discharge °  °Problem: Activity: °Goal: Risk for activity intolerance will decrease °Outcome: Adequate for Discharge °  °Problem: Nutrition: °Goal: Adequate nutrition will be maintained °Outcome: Adequate for Discharge °  °Problem: Coping: °Goal: Level of anxiety will decrease °Outcome: Adequate for Discharge °  °Problem: Elimination: °Goal: Will not experience complications related to bowel motility °Outcome: Adequate for Discharge °Goal: Will not experience complications related to urinary retention °Outcome: Adequate for Discharge °  °Problem: Pain Managment: °Goal: General experience of comfort will improve °Outcome: Adequate for Discharge °  °Problem: Safety: °Goal: Ability to remain free from injury will improve °Outcome: Adequate for Discharge °  °Problem: Skin Integrity: °Goal: Risk for impaired skin integrity will decrease °Outcome: Adequate for Discharge °  °Problem: Education: °Goal: Knowledge of disease or condition will improve °Outcome: Adequate for Discharge °Goal: Knowledge of secondary prevention will improve (SELECT ALL) °Outcome: Adequate for Discharge °  °

## 2021-06-01 NOTE — Progress Notes (Signed)
Patient's bp trending down, see vital sign flowsheet, Dr. Josephine Cables made aware, new order for bolus.

## 2021-06-01 NOTE — Plan of Care (Signed)
  Problem: Acute Rehab PT Goals(only PT should resolve) Goal: Patient Will Transfer Sit To/From Stand Outcome: Progressing Flowsheets (Taken 06/01/2021 1213) Patient will transfer sit to/from stand: with supervision Goal: Pt Will Transfer Bed To Chair/Chair To Bed Outcome: Progressing Flowsheets (Taken 06/01/2021 1213) Pt will Transfer Bed to Chair/Chair to Bed: with supervision Goal: Pt Will Perform Standing Balance Or Pre-Gait Outcome: Progressing Flowsheets (Taken 06/01/2021 1213) Pt will perform standing balance or pre-gait:  1-2 min  with bilateral UE support Goal: Pt Will Ambulate Outcome: Progressing Flowsheets (Taken 06/01/2021 1213) Pt will Ambulate:  50 feet  with least restrictive assistive device Goal: Pt Will Go Up/Down Stairs Outcome: Progressing Flowsheets (Taken 06/01/2021 1213) Pt will Go Up / Down Stairs:  1-2 stairs  with minimal assist   Britta Mccreedy D. Hartnett-Rands, MS, PT [facility] PT [facility] [employer] [phone] 06/01/2021

## 2021-06-01 NOTE — Evaluation (Signed)
Physical Therapy Evaluation Patient Details Name: Alice [name] MRN: [id number] DOB: Sep 20, 1965 Today's Date: 06/01/2021  History of Present Illness  Alice [name]  is a 56 y.o. 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 Leonel Ramsay in the ED and deemed to be out of the window for tPA  -Neurologist recommended brain MRI and full stroke work-up  -Patient denies chest pain palpitations dizziness or shortness of breath  No fever  Or chills.   Clinical Impression    Patient agreeable to participating in PT evaluation today. Patient sitting at edge of bed awaiting neuro e-consult when PT arrived. Patient performed bed mobility and transfers well with min guard assist for transfers in an unfamiliar environment.  Patient ambulated using labored cadence using RW exhibiting significant forward and right side bend while standing and during ambulation with RW. Foot drop and increased hip and knee flexion noted in swing through phases of gait on right. Patient required cues to walk within base of support as RW significantly out in from of patient's base of support. Min guard for safety. Patient reports she uses a Rollator at home and doesn't walk like this at home. Patient seated at edge of bed at end of session and beginning neuro e-consult at end of PT evaluation.  Patient would continue to benefit from skilled physical therapy in current environment and next venue to continue return to prior function and increase strength, endurance, balance, coordination, and functional mobility and gait skills.      Recommendations for follow up  therapy are one component of a multi-disciplinary discharge planning process, led by the attending physician.  Recommendations may be updated based on patient status, additional functional criteria and insurance authorization.  Follow Up Recommendations Home health PT    Assistance Recommended at Discharge Intermittent Supervision/Assistance  Patient can return home with the following  A little help with walking and/or transfers;A little help with bathing/dressing/bathroom;Assistance with cooking/housework;Assist for transportation;Help with stairs or ramp for entrance    Equipment Recommendations None recommended by PT  Recommendations for Other Services       Functional Status Assessment Patient has had a recent decline in their functional status and demonstrates the ability to make significant improvements in function in a reasonable and predictable amount of time.     Precautions / Restrictions Precautions Precautions: Fall Restrictions Weight Bearing Restrictions: No      Mobility  Bed Mobility Overal bed mobility: Modified Independent      Transfers Overall transfer level: Needs assistance Equipment used: Rolling walker (2 wheels) Transfers: Sit to/from Stand Sit to Stand: Supervision, Min guard   General transfer comment: min guard for unfamiliar environment    Ambulation/Gait Ambulation/Gait assistance: Min guard Gait Distance (Feet): 20 Feet Assistive device: Rolling walker (2 wheels) Gait Pattern/deviations: Step-through pattern, Decreased step length - right, Decreased step length - left, Decreased stride length, Wide base of support, Knee flexed in stance - left, Knee flexed in stance - right, Steppage, Trunk flexed Gait velocity: decreased     General Gait Details: labored cadence using RW; exhibiting significant forward and right side bend while standing and during ambulation with RW; foot drop and increased hip and  knee flexion noted in swing through  phases of gait on right; cues to walk within base of support as RW significantly out in from of patient's base of support  Stairs            Wheelchair Mobility    Modified Rankin (Stroke Patients Only)       Balance Overall balance assessment: Needs assistance Sitting-balance support: Bilateral upper extremity supported, Feet supported Sitting balance-Leahy Scale: Fair Sitting balance - Comments: at edge of bed, noted to have left lateral trunk lean Postural control: Left lateral lean Standing balance support: Bilateral upper extremity supported, During functional activity, Reliant on assistive device for balance Standing balance-Leahy Scale: Poor Standing balance comment: fair/poor with RW         Pertinent Vitals/Pain Pain Assessment Pain Assessment: No/denies pain    Home Living Family/patient expects to be discharged to:: Private residence Living Arrangements: Spouse/significant other;Other relatives (fiance and his son) Available Help at Discharge: Family;Available 24 hours/day Type of Home: House Home Access: Stairs to enter Entrance Stairs-Rails: None Entrance Stairs-Number of Steps: 2   Home Layout: One level Home Equipment: Shower seat;Grab bars - tub/shower;Grab bars - toilet;Hand held Chief Technology Officer (4 wheels)      Prior Function     Mobility Comments: household distances with Rollator ADLs Comments: assist for IADLs, bathing and dressing; 3 hours/day M thru Friday and 2 hours Sat and Sun     Hand Dominance        Extremity/Trunk Assessment   Upper Extremity Assessment Upper Extremity Assessment: Defer to OT evaluation    Lower Extremity Assessment Lower Extremity Assessment: RLE deficits/detail;Generalized weakness RLE Deficits / Details: decreased sensation reported mid shin and below RLE Sensation: decreased light touch RLE Coordination: decreased gross motor    Cervical / Trunk Assessment Cervical / Trunk Assessment:  Kyphotic;Other exceptions Cervical / Trunk Exceptions: right lateral trunk lean  Communication   Communication: No difficulties  Cognition Arousal/Alertness: Awake/alert Behavior During Therapy: WFL for tasks assessed/performed Overall Cognitive Status: Within Functional Limits for tasks assessed        General Comments      Exercises     Assessment/Plan    PT Assessment Patient needs continued PT services  PT Problem List Decreased strength;Decreased mobility;Decreased activity tolerance;Decreased balance;Decreased knowledge of use of DME       PT Treatment Interventions DME instruction;Therapeutic exercise;Gait training;Balance training;Neuromuscular re-education;Stair training;Functional mobility training;Therapeutic activities;Patient/family education    PT Goals (Current goals can be found in the Care Plan section)  Acute Rehab PT Goals Patient Stated Goal: Go home with assistance. PT Goal Formulation: With patient Time For Goal Achievement: 06/15/21 Potential to Achieve Goals: Fair    Frequency Min 3X/week        AM-PAC PT "6 Clicks" Mobility  Outcome Measure Help needed turning from your back to your side while in a flat bed without using bedrails?: None Help needed moving from lying on your back to sitting on the side of a flat bed without using bedrails?: A Little Help needed moving to and from a bed to a chair (including a wheelchair)?: A Little Help needed standing up from a chair using your arms (e.g., wheelchair or bedside chair)?: A Little Help needed to walk in hospital room?: A Little Help needed climbing 3-5 steps with a railing? : A Little 6 Click Score: 19    End of Session   Activity Tolerance: Patient tolerated treatment well Patient left: in bed;with nursing/sitter in room;with call bell/phone within  reach Nurse Communication: Mobility status PT Visit Diagnosis: Unsteadiness on feet (R26.81);Other abnormalities of gait and mobility  (R26.89);Muscle weakness (generalized) (M62.81);Other symptoms and signs involving the nervous system (R29.898);Difficulty in walking, not elsewhere classified (R26.2);Hemiplegia and hemiparesis Hemiplegia - Right/Left: Right Hemiplegia - dominant/non-dominant: Dominant Hemiplegia - caused by: Unspecified    Time: [id number] PT Time Calculation (min) (ACUTE ONLY): 28 min   Charges:   PT Evaluation $PT Eval Low Complexity: 1 Low PT Treatments $Therapeutic Activity: 8-22 mins        Floria Raveling. Hartnett-Rands, MS, PT Per [place] [phone]  Pamala Hurry  Hartnett-Rands 06/01/2021, 12:08 PM

## 2021-06-01 NOTE — Discharge Instructions (Addendum)
1)Please take Aspirin 81 mg daily along with Plavix 75 mg daily for 21 days then after that STOP the Plavix  and continue ONLY Aspirin 81 mg daily indefinitely--for secondary stroke Prevention  2)Avoid ibuprofen/Advil/Aleve/Motrin/Goody Powders/Naproxen/BC powders/Meloxicam/Diclofenac/Indomethacin and other Nonsteroidal anti-inflammatory medications as these will make you more likely to bleed and can cause stomach ulcers, can also cause Kidney problems.   3) outpatient physical therapy rehab advised  4)Please follow-up with Neurologist Dr. Beryle Beams-- Phone: [phone], Address: [address], [place], [place] [address] in 4 to 6 weeks for recheck and reevaluation.  Please call to make appointment with him  5) please stop lisinopril due to soft BP

## 2021-06-02 ENCOUNTER — Telehealth: Payer: Self-pay

## 2021-06-02 DIAGNOSIS — J449 Chronic obstructive pulmonary disease, unspecified: Secondary | ICD-10-CM | POA: Diagnosis not present

## 2021-06-02 DIAGNOSIS — I63339 Cerebral infarction due to thrombosis of unspecified posterior cerebral artery: Secondary | ICD-10-CM | POA: Diagnosis not present

## 2021-06-02 DIAGNOSIS — I639 Cerebral infarction, unspecified: Secondary | ICD-10-CM | POA: Diagnosis not present

## 2021-06-02 DIAGNOSIS — R32 Unspecified urinary incontinence: Secondary | ICD-10-CM | POA: Diagnosis not present

## 2021-06-02 NOTE — Telephone Encounter (Signed)
Transition Care Management Follow-up Telephone Call Date of discharge and from where: 06/01/2021-[facility]  How have you been since you were released from the hospital? Pt stated she is doing fine and has her medications.  Any questions or concerns? No  Items Reviewed: Did the pt receive and understand the discharge instructions provided? Yes  Medications obtained and verified? Yes  Other? No  Any new allergies since your discharge? No  Dietary orders reviewed? No Do you have support at home? Yes   Home Care and Equipment/Supplies: Were home health services ordered? not applicable If so, what is the name of the agency? N/A  Has the agency set up a time to come to the patient's home? not applicable Were any new equipment or medical supplies ordered?  No What is the name of the medical supply agency? N/A Were you able to get the supplies/equipment? not applicable Do you have any questions related to the use of the equipment or supplies? No  Functional Questionnaire: (I = Independent and D = Dependent) ADLs: I  Bathing/Dressing- I  Meal Prep- I  Eating- I  Maintaining continence- I  Transferring/Ambulation- I  Managing Meds- I  Follow up appointments reviewed:  PCP Hospital f/u appt confirmed? No   Specialist Hospital f/u appt confirmed? No   Are transportation arrangements needed? No  If their condition worsens, is the pt aware to call PCP or go to the Emergency Dept.? Yes Was the patient provided with contact information for the PCP's office or ED? Yes Was to pt encouraged to call back with questions or concerns? Yes

## 2021-06-03 DIAGNOSIS — I63339 Cerebral infarction due to thrombosis of unspecified posterior cerebral artery: Secondary | ICD-10-CM | POA: Diagnosis not present

## 2021-06-03 LAB — URINE CULTURE: Culture: >=100000 — AB

## 2021-06-04 ENCOUNTER — Other Ambulatory Visit: Payer: Self-pay | Admitting: Nurse Practitioner

## 2021-06-04 DIAGNOSIS — R895 Abnormal microbiological findings in specimens from other organs, systems and tissues: Secondary | ICD-10-CM

## 2021-06-04 DIAGNOSIS — I63339 Cerebral infarction due to thrombosis of unspecified posterior cerebral artery: Secondary | ICD-10-CM | POA: Diagnosis not present

## 2021-06-04 MED ORDER — NITROFURANTOIN MONOHYD MACRO 100 MG PO CAPS: 100.0000 mg | ORAL_CAPSULE | Freq: Two times a day (BID) | ORAL | 0 refills | Status: DC

## 2021-06-05 DIAGNOSIS — I63339 Cerebral infarction due to thrombosis of unspecified posterior cerebral artery: Secondary | ICD-10-CM | POA: Diagnosis not present

## 2021-06-06 DIAGNOSIS — I63339 Cerebral infarction due to thrombosis of unspecified posterior cerebral artery: Secondary | ICD-10-CM | POA: Diagnosis not present

## 2021-06-07 DIAGNOSIS — I63339 Cerebral infarction due to thrombosis of unspecified posterior cerebral artery: Secondary | ICD-10-CM | POA: Diagnosis not present

## 2021-06-08 DIAGNOSIS — I63339 Cerebral infarction due to thrombosis of unspecified posterior cerebral artery: Secondary | ICD-10-CM | POA: Diagnosis not present

## 2021-06-09 DIAGNOSIS — I63339 Cerebral infarction due to thrombosis of unspecified posterior cerebral artery: Secondary | ICD-10-CM | POA: Diagnosis not present

## 2021-06-10 DIAGNOSIS — I63339 Cerebral infarction due to thrombosis of unspecified posterior cerebral artery: Secondary | ICD-10-CM | POA: Diagnosis not present

## 2021-06-11 DIAGNOSIS — I63339 Cerebral infarction due to thrombosis of unspecified posterior cerebral artery: Secondary | ICD-10-CM | POA: Diagnosis not present

## 2021-06-12 DIAGNOSIS — I63339 Cerebral infarction due to thrombosis of unspecified posterior cerebral artery: Secondary | ICD-10-CM | POA: Diagnosis not present

## 2021-06-13 DIAGNOSIS — I63339 Cerebral infarction due to thrombosis of unspecified posterior cerebral artery: Secondary | ICD-10-CM | POA: Diagnosis not present

## 2021-06-14 DIAGNOSIS — I63339 Cerebral infarction due to thrombosis of unspecified posterior cerebral artery: Secondary | ICD-10-CM | POA: Diagnosis not present

## 2021-06-15 ENCOUNTER — Encounter: Payer: Self-pay | Admitting: Nurse Practitioner

## 2021-06-15 ENCOUNTER — Ambulatory Visit (INDEPENDENT_AMBULATORY_CARE_PROVIDER_SITE_OTHER): Payer: Medicaid Other | Admitting: Nurse Practitioner

## 2021-06-15 ENCOUNTER — Ambulatory Visit: Payer: Medicaid Other | Attending: Family Medicine

## 2021-06-15 VITALS — BP 135/86 | HR 77 | Temp 98.7°F | Ht 72.0 in | Wt 199.4 lb

## 2021-06-15 DIAGNOSIS — R262 Difficulty in walking, not elsewhere classified: Secondary | ICD-10-CM | POA: Diagnosis present

## 2021-06-15 DIAGNOSIS — G459 Transient cerebral ischemic attack, unspecified: Secondary | ICD-10-CM | POA: Diagnosis not present

## 2021-06-15 DIAGNOSIS — R2990 Unspecified symptoms and signs involving the nervous system: Secondary | ICD-10-CM | POA: Diagnosis not present

## 2021-06-15 DIAGNOSIS — Z09 Encounter for follow-up examination after completed treatment for conditions other than malignant neoplasm: Secondary | ICD-10-CM

## 2021-06-15 DIAGNOSIS — I63339 Cerebral infarction due to thrombosis of unspecified posterior cerebral artery: Secondary | ICD-10-CM | POA: Diagnosis not present

## 2021-06-15 DIAGNOSIS — M6281 Muscle weakness (generalized): Secondary | ICD-10-CM | POA: Insufficient documentation

## 2021-06-15 NOTE — Assessment & Plan Note (Signed)
Completed hospital discharge instructions with medication reconciliation.  Patient verbalized understanding.  All follow-up appointment completed patient knows to call with changes to schedule if not able to make appointments.

## 2021-06-15 NOTE — Patient Instructions (Signed)
Transient Ischemic Attack ?A transient ischemic attack (TIA) causes the same symptoms as a stroke, but the symptoms go away quickly. A TIA happens when blood flow to the brain is blocked. Having a TIA means you may be at risk for a stroke. A TIA is a medical emergency. ?What are the causes? ?A TIA is caused by a blocked artery in the head or neck. This means the brain does not get the blood supply it needs. A blockage can be caused by: ?Fatty buildup in an artery in the head or neck. ?A blood clot. ?A tear in an artery. ?Irritation and swelling (inflammation) of an artery. ?Sometimes the cause is not known. ?What increases the risk? ?Certain things may make you more likely to have a TIA. Some of these are things that you can change, such as: ?Being very overweight. ?Using products that have nicotine or tobacco. ?Taking birth control pills. ?Not being active. ?Drinking too much alcohol. ?Using drugs. ?Health conditions that may increase your risk include: ?High blood pressure. ?High cholesterol. ?Diabetes. ?Heart disease. ?A heartbeat that is not regular (atrial fibrillation). ?Sickle cell disease. ?Sleep problems (sleep apnea). ?Long-term diseases that cause irritation and swelling. ?Problems with blood clotting. ?Other risk factors include: ?Being over the age of 60. ?Being female. ?Having a family history of stroke. ?Having had blood clots, stroke, TIA, or heart attack in the past. ?Having a history of high blood pressure when pregnant (preeclampsia). ?Very bad headaches (migraines). ?What are the signs or symptoms? ?The symptoms of a TIA are like those of a stroke. They can include: ?Weakness or loss of feeling in your face, arm, or leg. This often happens on one side of your body. ?Trouble walking. ?Trouble moving your arms or legs. ?Trouble talking or understanding what people are saying. ?Problems with how you see. ?Feeling dizzy. ?Feeling confused. ?Loss of balance or coordination. ?Feeling like you may vomit  (nausea) or you vomit. ?Having a very bad headache. ?If you can, note what time you started to have symptoms. Tell your doctor. ?How is this treated? ?The goal of treatment is to lower the risk for a stroke. This may include: ?Changes to diet and lifestyle, such as getting regular exercise and stopping smoking. ?Taking medicines to: ?Thin the blood. ?Lower blood pressure. ?Lower cholesterol. ?Treating other health conditions, such as diabetes. ?If testing shows that an artery in your brain is narrow, your doctor may recommend a procedure to: ?Take the blockage out of your artery (carotid endarterectomy). ?Open or widen an artery in your neck (carotid angioplasty and stenting). ?Follow these instructions at home: ?Medicines ?Take over-the-counter and prescription medicines only as told by your doctor. ?If you were told to take aspirin or another medicine to thin your blood, take it exactly as told by your doctor. ?Taking too much of the medicine can cause bleeding. ?Taking too little of the medicine may not work to treat the problem. ?Eating and drinking ? ?Eat 5 or more servings of fruits and vegetables each day. ?Follow instructions from your doctor about your diet. You may need to follow a certain diet to help lower your risk of a stroke. You may need to: ?Eat a diet that is low in fat and salt. ?Eat foods with a lot of fiber. ?Limit carbohydrates and sugar. ?If you drink alcohol: ?Limit how much you have to: ?0-1 drink a day for women who are not pregnant. ?0-2 drinks a day for men. ?Know how much alcohol is in a drink. In the   [place] one drink equals one 12 oz bottle of beer (355mL), one 5 oz glass of wine (148mL), or one 1? oz glass of hard liquor (44mL). ?General instructions ?Keep a healthy weight. ?Try to get at least 30 minutes of exercise on most days. ?Get treatment if you have sleep problems. ?Do not smoke or use any products that contain nicotine or tobacco. If you need help quitting, ask your doctor. ?Do  not use drugs. ?Keep all follow-up visits. ?Where to find more information ?American Stroke Association: www.stroke.org ?Get help right away if: ?You have chest pain. ?You have a heartbeat that is not regular. ?You have any signs of a stroke. "BE FAST" is an easy way to remember the main warning signs: ?B - Balance. Dizziness, sudden trouble walking, or loss of balance. ?E - Eyes. Trouble seeing or a change in how you see. ?F - Face. Sudden weakness or loss of feeling of the face. The face or eyelid may droop on one side. ?A - Arms. Weakness or loss of feeling in an arm. This happens all of a sudden and most often on one side of the body. ?S - Speech. Sudden trouble speaking, slurred speech, or trouble understanding what people say. ?T - Time. Time to call emergency services. Write down what time symptoms started. ?You have other signs of a stroke, such as: ?A sudden, very bad headache with no known cause. ?Feeling like you may vomit. ?Vomiting. ?A seizure. ?These symptoms may be an emergency. Get help right away. Call your local emergency services (911 in the [place] ?Do not wait to see if the symptoms will go away. ?Do not drive yourself to the hospital. ?Summary ?A transient ischemic attack (TIA) happens when an artery in the head or neck is blocked. This causes the same symptoms as a stroke. The symptoms go away quickly. ?A TIA is a medical emergency. Get help right away, even if your symptoms go away. ?Having a TIA means that you may be at risk for a stroke. Taking medicines and making diet and lifestyle changes can help to prevent a stroke. ?This information is not intended to replace advice given to you by your health care provider. Make sure you discuss any questions you have with your health care provider. ?Document Revised: 07/24/2019 Document Reviewed: 07/24/2019 ?Elsevier Patient Education ? 2023 [employer] ? ?

## 2021-06-15 NOTE — Assessment & Plan Note (Signed)
Patient following up after hospital discharge for TIA.  Completed assessment.  Patient still presents with dysarthria.  Overall stable.  Continue Plavix and stop after 21 days.  Continue aspirin 81 mg tablet by mouth daily.  Patient knows to follow-up with worsening unresolved symptoms.  And to keep all follow-up appointments.
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
## 2021-06-16 DIAGNOSIS — I63339 Cerebral infarction due to thrombosis of unspecified posterior cerebral artery: Secondary | ICD-10-CM | POA: Diagnosis not present
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
## 2021-06-17 ENCOUNTER — Telehealth: Payer: Self-pay | Admitting: Nurse Practitioner

## 2021-06-17 DIAGNOSIS — I63339 Cerebral infarction due to thrombosis of unspecified posterior cerebral artery: Secondary | ICD-10-CM | POA: Diagnosis not present

## 2021-06-18 DIAGNOSIS — I63339 Cerebral infarction due to thrombosis of unspecified posterior cerebral artery: Secondary | ICD-10-CM | POA: Diagnosis not present

## 2021-06-18 NOTE — Telephone Encounter (Signed)
Called and [email or web address].

## 2021-06-18 NOTE — Telephone Encounter (Signed)
Patient calling back to see if she needs to stop taking her blood pressure medication. Said she is unable to come back in the office for an appointment. Started to message nurse to see it they could speak with patient and she hung up. Please call back.

## 2021-06-18 NOTE — Telephone Encounter (Signed)
Spoke to patient - she states that she was not aware that hospital wanted to d/c BP meds due to low BP until she was reading back over her paper work.  She has been taking medication and states that BP is running good, checks it daily. She said BP was low at hospital and iv fluids given. She states that BP was elevated at last office visit due to forgetting to take medication that morning. Patient takes lisinopril 10mg  - please review and advise if any changes need to be made or if patient should continue on BP medication.   Patient aware that PCP is out of office today - okay with waiting for r/c tomorrow after she returns. Patient has already taken medication this morning.

## 2021-06-19 ENCOUNTER — Telehealth: Payer: Self-pay | Admitting: Nurse Practitioner

## 2021-06-19 DIAGNOSIS — I63339 Cerebral infarction due to thrombosis of unspecified posterior cerebral artery: Secondary | ICD-10-CM | POA: Diagnosis not present

## 2021-06-19 NOTE — Telephone Encounter (Signed)
Contacted patient. She states BP has been high.  She wants to continue lisinopril and monitor.  Conslted On[place].  She said that is fine.

## 2021-06-19 NOTE — Telephone Encounter (Signed)
This is some of her hospital D/C instructions we went over in clinic. Yes she can stop her Blood pressure medication lisinopril. And continue monitoring at home and follow up if it becomes uncontrolled.    Please take Aspirin 81 mg daily along with Plavix 75 mg daily for 21 days then after that STOP the Plavix  and continue ONLY Aspirin 81 mg daily indefinitely--for secondary stroke Prevention   2)Avoid ibuprofen/Advil/Aleve/Motrin/Goody Powders/Naproxen/BC powders/Meloxicam/Diclofenac/Indomethacin and other Nonsteroidal anti-inflammatory medications as these will make you more likely to bleed and can cause stomach ulcers, can also cause Kidney problems.    3) outpatient physical therapy rehab advised   4)Please follow-up with Neurologist Dr. Phillips Odor-- Phone: [phone], Address: [address], [place], [place] [address] in 4 to 6 weeks for recheck and reevaluation.  Please call to make appointment with him   5) please stop lisinopril due to soft BP

## 2021-06-20 DIAGNOSIS — I63339 Cerebral infarction due to thrombosis of unspecified posterior cerebral artery: Secondary | ICD-10-CM | POA: Diagnosis not present

## 2021-06-21 DIAGNOSIS — I63339 Cerebral infarction due to thrombosis of unspecified posterior cerebral artery: Secondary | ICD-10-CM | POA: Diagnosis not present

## 2021-06-22 DIAGNOSIS — I63339 Cerebral infarction due to thrombosis of unspecified posterior cerebral artery: Secondary | ICD-10-CM | POA: Diagnosis not present

## 2021-06-23 DIAGNOSIS — I63339 Cerebral infarction due to thrombosis of unspecified posterior cerebral artery: Secondary | ICD-10-CM | POA: Diagnosis not present

## 2021-06-24 DIAGNOSIS — I63339 Cerebral infarction due to thrombosis of unspecified posterior cerebral artery: Secondary | ICD-10-CM | POA: Diagnosis not present

## 2021-06-25 DIAGNOSIS — I63339 Cerebral infarction due to thrombosis of unspecified posterior cerebral artery: Secondary | ICD-10-CM | POA: Diagnosis not present

## 2021-06-26 DIAGNOSIS — I63339 Cerebral infarction due to thrombosis of unspecified posterior cerebral artery: Secondary | ICD-10-CM | POA: Diagnosis not present

## 2021-06-27 DIAGNOSIS — I63339 Cerebral infarction due to thrombosis of unspecified posterior cerebral artery: Secondary | ICD-10-CM | POA: Diagnosis not present

## 2021-06-28 DIAGNOSIS — I63339 Cerebral infarction due to thrombosis of unspecified posterior cerebral artery: Secondary | ICD-10-CM | POA: Diagnosis not present

## 2021-06-29 ENCOUNTER — Ambulatory Visit: Payer: Medicaid Other

## 2021-06-30 DIAGNOSIS — I63339 Cerebral infarction due to thrombosis of unspecified posterior cerebral artery: Secondary | ICD-10-CM | POA: Diagnosis not present

## 2021-07-01 DIAGNOSIS — I63339 Cerebral infarction due to thrombosis of unspecified posterior cerebral artery: Secondary | ICD-10-CM | POA: Diagnosis not present

## 2021-07-02 DIAGNOSIS — I63339 Cerebral infarction due to thrombosis of unspecified posterior cerebral artery: Secondary | ICD-10-CM | POA: Diagnosis not present

## 2021-07-03 ENCOUNTER — Ambulatory Visit: Payer: Medicaid Other

## 2021-07-03 DIAGNOSIS — R262 Difficulty in walking, not elsewhere classified: Secondary | ICD-10-CM | POA: Diagnosis not present

## 2021-07-03 DIAGNOSIS — M6281 Muscle weakness (generalized): Secondary | ICD-10-CM

## 2021-07-03 DIAGNOSIS — I63339 Cerebral infarction due to thrombosis of unspecified posterior cerebral artery: Secondary | ICD-10-CM | POA: Diagnosis not present

## 2021-07-04 DIAGNOSIS — I63339 Cerebral infarction due to thrombosis of unspecified posterior cerebral artery: Secondary | ICD-10-CM | POA: Diagnosis not present

## 2021-07-05 DIAGNOSIS — I63339 Cerebral infarction due to thrombosis of unspecified posterior cerebral artery: Secondary | ICD-10-CM | POA: Diagnosis not present

## 2021-07-06 DIAGNOSIS — I63339 Cerebral infarction due to thrombosis of unspecified posterior cerebral artery: Secondary | ICD-10-CM | POA: Diagnosis not present

## 2021-07-07 DIAGNOSIS — I63339 Cerebral infarction due to thrombosis of unspecified posterior cerebral artery: Secondary | ICD-10-CM | POA: Diagnosis not present

## 2021-07-08 ENCOUNTER — Encounter: Payer: Medicaid Other | Admitting: Physical Therapy

## 2021-07-08 DIAGNOSIS — I63339 Cerebral infarction due to thrombosis of unspecified posterior cerebral artery: Secondary | ICD-10-CM | POA: Diagnosis not present

## 2021-07-09 DIAGNOSIS — I63339 Cerebral infarction due to thrombosis of unspecified posterior cerebral artery: Secondary | ICD-10-CM | POA: Diagnosis not present

## 2021-07-10 DIAGNOSIS — I63339 Cerebral infarction due to thrombosis of unspecified posterior cerebral artery: Secondary | ICD-10-CM | POA: Diagnosis not present

## 2021-07-11 DIAGNOSIS — I63339 Cerebral infarction due to thrombosis of unspecified posterior cerebral artery: Secondary | ICD-10-CM | POA: Diagnosis not present

## 2021-07-12 DIAGNOSIS — I63339 Cerebral infarction due to thrombosis of unspecified posterior cerebral artery: Secondary | ICD-10-CM | POA: Diagnosis not present

## 2021-07-13 DIAGNOSIS — I63339 Cerebral infarction due to thrombosis of unspecified posterior cerebral artery: Secondary | ICD-10-CM | POA: Diagnosis not present

## 2021-07-15 DIAGNOSIS — M961 Postlaminectomy syndrome, not elsewhere classified: Secondary | ICD-10-CM | POA: Diagnosis not present

## 2021-07-15 DIAGNOSIS — I69328 Other speech and language deficits following cerebral infarction: Secondary | ICD-10-CM | POA: Diagnosis not present

## 2021-07-15 DIAGNOSIS — I69393 Ataxia following cerebral infarction: Secondary | ICD-10-CM | POA: Diagnosis not present

## 2021-07-15 DIAGNOSIS — Z79899 Other long term (current) drug therapy: Secondary | ICD-10-CM | POA: Diagnosis not present

## 2021-07-15 DIAGNOSIS — I63339 Cerebral infarction due to thrombosis of unspecified posterior cerebral artery: Secondary | ICD-10-CM | POA: Diagnosis not present

## 2021-07-15 DIAGNOSIS — I10 Essential (primary) hypertension: Secondary | ICD-10-CM | POA: Diagnosis not present

## 2021-07-16 DIAGNOSIS — I63339 Cerebral infarction due to thrombosis of unspecified posterior cerebral artery: Secondary | ICD-10-CM | POA: Diagnosis not present

## 2021-07-17 DIAGNOSIS — I63339 Cerebral infarction due to thrombosis of unspecified posterior cerebral artery: Secondary | ICD-10-CM | POA: Diagnosis not present

## 2021-07-18 ENCOUNTER — Emergency Department (HOSPITAL_COMMUNITY): Payer: Medicaid Other

## 2021-07-18 ENCOUNTER — Encounter (HOSPITAL_COMMUNITY): Payer: Self-pay

## 2021-07-18 ENCOUNTER — Other Ambulatory Visit: Payer: Self-pay

## 2021-07-18 ENCOUNTER — Emergency Department (HOSPITAL_COMMUNITY)
Admission: EM | Admit: 2021-07-18 | Discharge: 2021-07-18 | Disposition: A | Discharge status: Home / Self Care | Payer: Medicaid Other | Attending: Emergency Medicine | Admitting: Emergency Medicine

## 2021-07-18 DIAGNOSIS — E860 Dehydration: Secondary | ICD-10-CM | POA: Insufficient documentation

## 2021-07-18 DIAGNOSIS — R52 Pain, unspecified: Secondary | ICD-10-CM | POA: Diagnosis not present

## 2021-07-18 DIAGNOSIS — R0789 Other chest pain: Secondary | ICD-10-CM | POA: Diagnosis not present

## 2021-07-18 DIAGNOSIS — R42 Dizziness and giddiness: Secondary | ICD-10-CM | POA: Diagnosis not present

## 2021-07-18 DIAGNOSIS — Z8673 Personal history of transient ischemic attack (TIA), and cerebral infarction without residual deficits: Secondary | ICD-10-CM | POA: Diagnosis not present

## 2021-07-18 DIAGNOSIS — K449 Diaphragmatic hernia without obstruction or gangrene: Secondary | ICD-10-CM | POA: Insufficient documentation

## 2021-07-18 DIAGNOSIS — Z7982 Long term (current) use of aspirin: Secondary | ICD-10-CM | POA: Insufficient documentation

## 2021-07-18 DIAGNOSIS — J449 Chronic obstructive pulmonary disease, unspecified: Secondary | ICD-10-CM | POA: Diagnosis not present

## 2021-07-18 DIAGNOSIS — I63339 Cerebral infarction due to thrombosis of unspecified posterior cerebral artery: Secondary | ICD-10-CM | POA: Diagnosis not present

## 2021-07-18 DIAGNOSIS — J811 Chronic pulmonary edema: Secondary | ICD-10-CM | POA: Diagnosis not present

## 2021-07-18 DIAGNOSIS — R079 Chest pain, unspecified: Secondary | ICD-10-CM | POA: Diagnosis not present

## 2021-07-18 DIAGNOSIS — R7989 Other specified abnormal findings of blood chemistry: Secondary | ICD-10-CM | POA: Diagnosis not present

## 2021-07-18 DIAGNOSIS — I10 Essential (primary) hypertension: Secondary | ICD-10-CM | POA: Insufficient documentation

## 2021-07-18 LAB — CBG MONITORING, ED: Glucose-Capillary: 117 mg/dL — ABNORMAL HIGH (ref 70–99)

## 2021-07-18 LAB — CBC
HCT: 36.6 % (ref 36.0–46.0)
Hemoglobin: 12.2 g/dL (ref 12.0–15.0)
MCH: 30.7 pg (ref 26.0–34.0)
MCHC: 33.3 g/dL (ref 30.0–36.0)
MCV: 92.2 fL (ref 80.0–100.0)
Platelets: 218 10*3/uL (ref 150–400)
RBC: 3.97 MIL/uL (ref 3.87–5.11)
RDW: 12.1 % (ref 11.5–15.5)
WBC: 5.9 10*3/uL (ref 4.0–10.5)
nRBC: 0 % (ref 0.0–0.2)

## 2021-07-18 LAB — HCG, SERUM, QUALITATIVE: Preg, Serum: NEGATIVE

## 2021-07-18 LAB — BASIC METABOLIC PANEL
Anion gap: 6 (ref 5–15)
BUN: 10 mg/dL (ref 6–20)
CO2: 30 mmol/L (ref 22–32)
Calcium: 9.1 mg/dL (ref 8.9–10.3)
Chloride: 104 mmol/L (ref 98–111)
Creatinine, Ser: 0.76 mg/dL (ref 0.44–1.00)
GFR, Estimated: >60 mL/min (ref >60)
Glucose, Bld: 118 mg/dL — ABNORMAL HIGH (ref 70–99)
Potassium: 4.5 mmol/L (ref 3.5–5.1)
Sodium: 140 mmol/L (ref 135–145)

## 2021-07-18 LAB — TROPONIN I (HIGH SENSITIVITY)
Troponin I (High Sensitivity): 2 ng/L (ref <18)
Troponin I (High Sensitivity): <2 ng/L (ref <18)

## 2021-07-18 LAB — D-DIMER, QUANTITATIVE: D-Dimer, Quant: 0.6 ug/mL-FEU — ABNORMAL HIGH (ref 0.00–0.50)

## 2021-07-18 MED ORDER — ASPIRIN 81 MG PO CHEW: 324.0000 mg | CHEWABLE_TABLET | Freq: Once | ORAL | Status: DC

## 2021-07-18 MED ORDER — SODIUM CHLORIDE 0.9 % IV SOLN: 1000.0000 mL | INTRAVENOUS | Status: DC

## 2021-07-18 MED ORDER — PANTOPRAZOLE SODIUM 40 MG PO TBEC: 40.0000 mg | DELAYED_RELEASE_TABLET | Freq: Every day | ORAL | 0 refills | Status: DC

## 2021-07-18 MED ORDER — SODIUM CHLORIDE 0.9 % IV BOLUS (SEPSIS)
1000.0000 mL | Freq: Once | INTRAVENOUS | Status: AC
  Administered 2021-07-18: 1000 mL via INTRAVENOUS

## 2021-07-18 MED ORDER — IOHEXOL 350 MG/ML SOLN
75.0000 mL | Freq: Once | INTRAVENOUS | Status: AC | PRN
  Administered 2021-07-18: 75 mL via INTRAVENOUS

## 2021-07-18 MED ORDER — AZITHROMYCIN 250 MG PO TABS: 250.0000 mg | ORAL_TABLET | Freq: Every day | ORAL | 0 refills | Status: DC

## 2021-07-18 NOTE — ED Triage Notes (Signed)
Pt to ED via [facility] from home c/o chest pain since waking this morning. States she woke up at 645 this morning, upon waking she became dizzy and experienced stabbing chest pain. 324 aspirin given by EMS, but pt refused nitro. Also c/o blurry vision and heaviness in right arm. EDP informed of sx at this time.

## 2021-07-18 NOTE — Discharge Instructions (Signed)
Take the medication as prescribed for the questionable pneumonia as well as the antacid to help with symptoms related to hiatal hernia.  Follow-up with your primary care doctor next week to be rechecked

## 2021-07-18 NOTE — ED Provider Notes (Signed)
[facility] EMERGENCY DEPARTMENT Provider Note   CSN: [id number] Arrival date & time: 07/18/21  0827     History {Add pertinent medical, surgical, social history, OB history to HPI:1} Chief Complaint  Patient presents with   Chest Pain    Alice [name] is a 56 y.o. female.   Chest Pain  Patient has history of hypertension, COPD, hyperlipidemia, stroke who presents with complaints of chest pain.  Patient states when she woke up this morning she was feeling fine.  She started reading her prayers and also and had sudden onset of sharp pain in her chest.  Radiated to her arm and she felt like her arm was extremely heavy.  Patient states the pain resolved after several minutes but then she had another episode of pain that was very intense and severe.  She called 911.  No nausea no diaphoresis.  Patient did feel somewhat unsteady when she was trying to walk.  She denies any focal numbness or weakness now.  No headache.  Pain has resolved.  She denies any history of heart or lung disease.    Home Medications Prior to Admission medications   Medication Sig Start Date End Date Taking? Authorizing Provider  albuterol (VENTOLIN HFA) 108 (90 Base) MCG/ACT inhaler Inhale 2 puffs into the lungs every 6 (six) hours as needed for wheezing or shortness of breath. 06/01/21   Shon Hale, MD  aspirin EC 81 MG tablet Take 1 tablet (81 mg total) by mouth daily with breakfast. For Stroke Prevention 06/01/21   Shon Hale, MD  atorvastatin (LIPITOR) 80 MG tablet Take 1 tablet (80 mg total) by mouth daily. 06/01/21 06/01/22  Shon Hale, MD  baclofen (LIORESAL) 10 MG tablet Take 1 tablet (10 mg total) by mouth 3 (three) times daily. 02/25/21   Daryll Drown, NP  gabapentin (NEURONTIN) 100 MG capsule Take 100 mg by mouth 2 (two) times daily. 01/13/21   [provider]  LATUDA 20 MG TABS tablet Take 20 mg by mouth every evening. 05/12/21   [provider]  nitrofurantoin,  macrocrystal-monohydrate, (MACROBID) 100 MG capsule Take 1 capsule (100 mg total) by mouth 2 (two) times daily. 1 po BId 06/04/21   Daryll Drown, NP  SUBOXONE 8-2 MG FILM Take 0.5 Film by mouth 4 (four) times daily. Dissolve 0.5 films under tongue 4 times a day 10/15/20   [provider]  umeclidinium-vilanterol (ANORO ELLIPTA) 62.5-25 MCG/ACT AEPB Inhale 1 puff into the lungs daily. For COPD 06/01/21   Shon Hale, MD      Allergies    Benadryl [diphenhydramine hcl], Codeine, and Tetracycline    Review of Systems   Review of Systems  Cardiovascular:  Positive for chest pain.    Physical Exam Updated Vital Signs BP 106/79   Pulse 62   Temp 97.9 F (36.6 C)   Resp 18   Ht 1.829 m (6')   Wt 90 kg   LMP 07/30/2011   SpO2 96%   BMI 26.91 kg/m  Physical Exam Vitals and nursing note reviewed.  Constitutional:      General: She is not in acute distress.    Appearance: She is well-developed.  HENT:     Head: Normocephalic and atraumatic.     Right Ear: External ear normal.     Left Ear: External ear normal.  Eyes:     General: No scleral icterus.       Right eye: No discharge.        Left  eye: No discharge.     Conjunctiva/sclera: Conjunctivae normal.  Neck:     Trachea: No tracheal deviation.  Cardiovascular:     Rate and Rhythm: Normal rate and regular rhythm.  Pulmonary:     Effort: Pulmonary effort is normal. No respiratory distress.     Breath sounds: Normal breath sounds. No stridor. No wheezing or rales.  Abdominal:     General: Bowel sounds are normal. There is no distension.     Palpations: Abdomen is soft.     Tenderness: There is no abdominal tenderness. There is no guarding or rebound.  Musculoskeletal:        General: No tenderness or deformity.     Cervical back: Neck supple.  Skin:    General: Skin is warm and dry.     Findings: No rash.  Neurological:     General: No focal deficit present.     Mental Status: She is alert and oriented  to person, place, and time.     Cranial Nerves: No cranial nerve deficit (no facial droop, extraocular movements intact, no slurred speech).     Sensory: No sensory deficit.     Motor: No abnormal muscle tone or seizure activity.     Coordination: Coordination normal.     Comments: No pronator drift bilateral upper extrem, able to hold both legs off bed for 5 seconds, sensation intact in all extremities, no visual field cuts, no left or right sided neglect, normal finger-nose exam bilaterally, no nystagmus noted  No facial droop, extraocular movements intact, tongue midline  Psychiatric:        Mood and Affect: Mood normal.     ED Results / Procedures / Treatments   Labs (all labs ordered are listed, but only abnormal results are displayed) Labs Reviewed  BASIC METABOLIC PANEL  CBC  HCG, SERUM, QUALITATIVE  CBG MONITORING, ED  TROPONIN I (HIGH SENSITIVITY)    EKG EKG Interpretation  Date/Time:  Saturday July 18 2021 08:39:35 EDT Ventricular Rate:  65 PR Interval:  155 QRS Duration: 96 QT Interval:  407 QTC Calculation: 424 R Axis:   47 Text Interpretation: Sinus rhythm Low voltage, precordial leads No significant change since last tracing Confirmed by Linwood Dibbles [phone]) on 07/18/2021 8:42:22 AM  Radiology No results found.  Procedures Procedures  {Document cardiac monitor, telemetry assessment procedure when appropriate:1}  Medications Ordered in ED Medications  aspirin chewable tablet 324 mg (has no administration in time range)    ED Course/ Medical Decision Making/ A&P                           Medical Decision Making Amount and/or Complexity of Data Reviewed Labs: ordered. Radiology: ordered.  Risk OTC drugs.   ***  {Document critical care time when appropriate:1} {Document review of labs and clinical decision tools ie heart score, Chads2Vasc2 etc:1}  {Document your independent review of radiology images, and any outside records:1} {Document your  discussion with family members, caretakers, and with consultants:1} {Document social determinants of health affecting pt's care:1} {Document your decision making why or why not admission, treatments were needed:1} Final Clinical Impression(s) / ED Diagnoses Final diagnoses:  None    Rx / DC Orders ED Discharge Orders     None

## 2021-07-19 DIAGNOSIS — I63339 Cerebral infarction due to thrombosis of unspecified posterior cerebral artery: Secondary | ICD-10-CM | POA: Diagnosis not present

## 2021-07-20 DIAGNOSIS — I63339 Cerebral infarction due to thrombosis of unspecified posterior cerebral artery: Secondary | ICD-10-CM | POA: Diagnosis not present

## 2021-07-21 DIAGNOSIS — I63339 Cerebral infarction due to thrombosis of unspecified posterior cerebral artery: Secondary | ICD-10-CM | POA: Diagnosis not present

## 2021-07-22 DIAGNOSIS — I63339 Cerebral infarction due to thrombosis of unspecified posterior cerebral artery: Secondary | ICD-10-CM | POA: Diagnosis not present

## 2021-07-23 DIAGNOSIS — I63339 Cerebral infarction due to thrombosis of unspecified posterior cerebral artery: Secondary | ICD-10-CM | POA: Diagnosis not present

## 2021-07-24 ENCOUNTER — Other Ambulatory Visit: Payer: Self-pay

## 2021-07-24 ENCOUNTER — Encounter (HOSPITAL_COMMUNITY): Payer: Self-pay

## 2021-07-24 ENCOUNTER — Emergency Department (HOSPITAL_COMMUNITY): Payer: Medicaid Other

## 2021-07-24 ENCOUNTER — Observation Stay (HOSPITAL_COMMUNITY)
Admission: EM | Admit: 2021-07-24 | Discharge: 2021-07-25 | Disposition: A | Discharge status: Home Health | Payer: Medicaid Other | Attending: Family Medicine | Admitting: Family Medicine

## 2021-07-24 DIAGNOSIS — S81801A Unspecified open wound, right lower leg, initial encounter: Secondary | ICD-10-CM | POA: Diagnosis not present

## 2021-07-24 DIAGNOSIS — Z8673 Personal history of transient ischemic attack (TIA), and cerebral infarction without residual deficits: Secondary | ICD-10-CM | POA: Insufficient documentation

## 2021-07-24 DIAGNOSIS — Z7982 Long term (current) use of aspirin: Secondary | ICD-10-CM | POA: Insufficient documentation

## 2021-07-24 DIAGNOSIS — I639 Cerebral infarction, unspecified: Secondary | ICD-10-CM | POA: Diagnosis not present

## 2021-07-24 DIAGNOSIS — Z23 Encounter for immunization: Secondary | ICD-10-CM | POA: Diagnosis not present

## 2021-07-24 DIAGNOSIS — S81811A Laceration without foreign body, right lower leg, initial encounter: Secondary | ICD-10-CM | POA: Diagnosis not present

## 2021-07-24 DIAGNOSIS — Z20822 Contact with and (suspected) exposure to covid-19: Secondary | ICD-10-CM | POA: Insufficient documentation

## 2021-07-24 DIAGNOSIS — Z28310 Unvaccinated for covid-19: Secondary | ICD-10-CM | POA: Insufficient documentation

## 2021-07-24 DIAGNOSIS — Z79899 Other long term (current) drug therapy: Secondary | ICD-10-CM | POA: Diagnosis not present

## 2021-07-24 DIAGNOSIS — W19XXXA Unspecified fall, initial encounter: Secondary | ICD-10-CM

## 2021-07-24 DIAGNOSIS — R29818 Other symptoms and signs involving the nervous system: Secondary | ICD-10-CM | POA: Diagnosis not present

## 2021-07-24 DIAGNOSIS — I10 Essential (primary) hypertension: Secondary | ICD-10-CM

## 2021-07-24 DIAGNOSIS — Y9289 Other specified places as the place of occurrence of the external cause: Secondary | ICD-10-CM | POA: Insufficient documentation

## 2021-07-24 DIAGNOSIS — J449 Chronic obstructive pulmonary disease, unspecified: Secondary | ICD-10-CM | POA: Insufficient documentation

## 2021-07-24 DIAGNOSIS — M1711 Unilateral primary osteoarthritis, right knee: Secondary | ICD-10-CM | POA: Diagnosis not present

## 2021-07-24 DIAGNOSIS — K219 Gastro-esophageal reflux disease without esophagitis: Secondary | ICD-10-CM

## 2021-07-24 DIAGNOSIS — R52 Pain, unspecified: Secondary | ICD-10-CM | POA: Diagnosis not present

## 2021-07-24 DIAGNOSIS — Y92009 Unspecified place in unspecified non-institutional (private) residence as the place of occurrence of the external cause: Secondary | ICD-10-CM

## 2021-07-24 DIAGNOSIS — W108XXA Fall (on) (from) other stairs and steps, initial encounter: Secondary | ICD-10-CM | POA: Insufficient documentation

## 2021-07-24 DIAGNOSIS — J441 Chronic obstructive pulmonary disease with (acute) exacerbation: Secondary | ICD-10-CM | POA: Diagnosis present

## 2021-07-24 DIAGNOSIS — M5137 Other intervertebral disc degeneration, lumbosacral region: Secondary | ICD-10-CM

## 2021-07-24 DIAGNOSIS — F17200 Nicotine dependence, unspecified, uncomplicated: Secondary | ICD-10-CM | POA: Diagnosis present

## 2021-07-24 DIAGNOSIS — F17210 Nicotine dependence, cigarettes, uncomplicated: Secondary | ICD-10-CM | POA: Diagnosis present

## 2021-07-24 DIAGNOSIS — I63339 Cerebral infarction due to thrombosis of unspecified posterior cerebral artery: Secondary | ICD-10-CM | POA: Diagnosis not present

## 2021-07-24 DIAGNOSIS — R42 Dizziness and giddiness: Principal | ICD-10-CM

## 2021-07-24 DIAGNOSIS — I959 Hypotension, unspecified: Secondary | ICD-10-CM | POA: Diagnosis not present

## 2021-07-24 LAB — COMPREHENSIVE METABOLIC PANEL
ALT: 8 U/L (ref 0–44)
AST: 18 U/L (ref 15–41)
Albumin: 4.2 g/dL (ref 3.5–5.0)
Alkaline Phosphatase: 89 U/L (ref 38–126)
Anion gap: 7 (ref 5–15)
BUN: 9 mg/dL (ref 6–20)
CO2: 28 mmol/L (ref 22–32)
Calcium: 9.1 mg/dL (ref 8.9–10.3)
Chloride: 103 mmol/L (ref 98–111)
Creatinine, Ser: 0.84 mg/dL (ref 0.44–1.00)
GFR, Estimated: >60 mL/min (ref >60)
Glucose, Bld: 101 mg/dL — ABNORMAL HIGH (ref 70–99)
Potassium: 4.4 mmol/L (ref 3.5–5.1)
Sodium: 138 mmol/L (ref 135–145)
Total Bilirubin: 1 mg/dL (ref 0.3–1.2)
Total Protein: 7.9 g/dL (ref 6.5–8.1)

## 2021-07-24 LAB — CBC WITH DIFFERENTIAL/PLATELET
Abs Immature Granulocytes: 0.02 10*3/uL (ref 0.00–0.07)
Basophils Absolute: 0.1 10*3/uL (ref 0.0–0.1)
Basophils Relative: 1 %
Eosinophils Absolute: 0.2 10*3/uL (ref 0.0–0.5)
Eosinophils Relative: 3 %
HCT: 37.6 % (ref 36.0–46.0)
Hemoglobin: 12.8 g/dL (ref 12.0–15.0)
Immature Granulocytes: 0 %
Lymphocytes Relative: 17 %
Lymphs Abs: 1.2 10*3/uL (ref 0.7–4.0)
MCH: 31.3 pg (ref 26.0–34.0)
MCHC: 34 g/dL (ref 30.0–36.0)
MCV: 91.9 fL (ref 80.0–100.0)
Monocytes Absolute: 0.4 10*3/uL (ref 0.1–1.0)
Monocytes Relative: 5 %
Neutro Abs: 5 10*3/uL (ref 1.7–7.7)
Neutrophils Relative %: 74 %
Platelets: 222 10*3/uL (ref 150–400)
RBC: 4.09 MIL/uL (ref 3.87–5.11)
RDW: 12.3 % (ref 11.5–15.5)
WBC: 6.7 10*3/uL (ref 4.0–10.5)
nRBC: 0 % (ref 0.0–0.2)

## 2021-07-24 LAB — URINALYSIS, ROUTINE W REFLEX MICROSCOPIC
Bilirubin Urine: NEGATIVE
Glucose, UA: NEGATIVE mg/dL
Hgb urine dipstick: NEGATIVE
Ketones, ur: NEGATIVE mg/dL
Leukocytes,Ua: NEGATIVE
Nitrite: NEGATIVE
Protein, ur: NEGATIVE mg/dL
Specific Gravity, Urine: 1.006 (ref 1.005–1.030)
pH: 8 (ref 5.0–8.0)

## 2021-07-24 LAB — I-STAT CHEM 8, ED
BUN: 7 mg/dL (ref 6–20)
Calcium, Ion: 1.12 mmol/L — ABNORMAL LOW (ref 1.15–1.40)
Chloride: 103 mmol/L (ref 98–111)
Creatinine, Ser: 0.6 mg/dL (ref 0.44–1.00)
Glucose, Bld: 95 mg/dL (ref 70–99)
HCT: 38 % (ref 36.0–46.0)
Hemoglobin: 12.9 g/dL (ref 12.0–15.0)
Potassium: 3.9 mmol/L (ref 3.5–5.1)
Sodium: 142 mmol/L (ref 135–145)
TCO2: 27 mmol/L (ref 22–32)

## 2021-07-24 LAB — RAPID URINE DRUG SCREEN, HOSP PERFORMED
Amphetamines: NOT DETECTED
Barbiturates: NOT DETECTED
Benzodiazepines: NOT DETECTED
Cocaine: NOT DETECTED
Opiates: NOT DETECTED
Tetrahydrocannabinol: NOT DETECTED

## 2021-07-24 LAB — POC URINE PREG, ED: Preg Test, Ur: NEGATIVE

## 2021-07-24 LAB — RESP PANEL BY RT-PCR (FLU A&B, COVID) ARPGX2
Influenza A by PCR: NEGATIVE
Influenza B by PCR: NEGATIVE
SARS Coronavirus 2 by RT PCR: NEGATIVE

## 2021-07-24 LAB — PROTIME-INR
INR: 1 (ref 0.8–1.2)
Prothrombin Time: 13.1 seconds (ref 11.4–15.2)

## 2021-07-24 LAB — CBG MONITORING, ED: Glucose-Capillary: 92 mg/dL (ref 70–99)

## 2021-07-24 LAB — ETHANOL
Alcohol, Ethyl (B): <10 mg/dL (ref <10)
Alcohol, Ethyl (B): <10 mg/dL

## 2021-07-24 LAB — APTT: aPTT: 27 seconds (ref 24–36)

## 2021-07-24 MED ORDER — HEPARIN SODIUM (PORCINE) 5000 UNIT/ML IJ SOLN
5000.0000 [IU] | Freq: Three times a day (TID) | INTRAMUSCULAR | Status: DC
  Administered 2021-07-24 – 2021-07-25 (×2): 5000 [IU] via SUBCUTANEOUS
  Filled 2021-07-24: qty 1

## 2021-07-24 MED ORDER — PANTOPRAZOLE SODIUM 40 MG PO TBEC
40.0000 mg | DELAYED_RELEASE_TABLET | Freq: Every day | ORAL | Status: DC
  Administered 2021-07-25: 40 mg via ORAL
  Filled 2021-07-24: qty 1

## 2021-07-24 MED ORDER — NICOTINE 14 MG/24HR TD PT24
14.0000 mg | MEDICATED_PATCH | Freq: Every day | TRANSDERMAL | Status: DC
  Administered 2021-07-24 – 2021-07-25 (×2): 14 mg via TRANSDERMAL
  Filled 2021-07-24 (×2): qty 1

## 2021-07-24 MED ORDER — DIAZEPAM 5 MG/ML IJ SOLN
5.0000 mg | Freq: Once | INTRAMUSCULAR | Status: AC
  Administered 2021-07-24: 5 mg via INTRAVENOUS
  Filled 2021-07-24: qty 2

## 2021-07-24 MED ORDER — TETANUS-DIPHTH-ACELL PERTUSSIS 5-2.5-18.5 LF-MCG/0.5 IM SUSY
0.5000 mL | PREFILLED_SYRINGE | Freq: Once | INTRAMUSCULAR | Status: AC
  Administered 2021-07-24: 0.5 mL via INTRAMUSCULAR
  Filled 2021-07-24: qty 0.5

## 2021-07-24 MED ORDER — PROCHLORPERAZINE EDISYLATE 10 MG/2ML IJ SOLN
10.0000 mg | Freq: Once | INTRAMUSCULAR | Status: AC
  Administered 2021-07-24: 10 mg via INTRAVENOUS
  Filled 2021-07-24: qty 2

## 2021-07-24 MED ORDER — LIDOCAINE-EPINEPHRINE (PF) 2 %-1:200000 IJ SOLN
10.0000 mL | Freq: Once | INTRAMUSCULAR | Status: AC
  Administered 2021-07-24: 10 mL
  Filled 2021-07-24: qty 20

## 2021-07-24 MED ORDER — MECLIZINE HCL 12.5 MG PO TABS
25.0000 mg | ORAL_TABLET | Freq: Once | ORAL | Status: AC
  Administered 2021-07-24: 25 mg via ORAL
  Filled 2021-07-24: qty 2

## 2021-07-24 MED ORDER — ACETAMINOPHEN 160 MG/5ML PO SOLN: 650.0000 mg | ORAL | Status: DC | PRN

## 2021-07-24 MED ORDER — LISINOPRIL 10 MG PO TABS
10.0000 mg | ORAL_TABLET | Freq: Every day | ORAL | Status: DC
  Administered 2021-07-25: 10 mg via ORAL
  Filled 2021-07-24: qty 1

## 2021-07-24 MED ORDER — ASPIRIN 325 MG PO TBEC
650.0000 mg | DELAYED_RELEASE_TABLET | Freq: Once | ORAL | Status: AC
  Administered 2021-07-24: 650 mg via ORAL
  Filled 2021-07-24: qty 2

## 2021-07-24 MED ORDER — ACETAMINOPHEN 650 MG RE SUPP: 650.0000 mg | RECTAL | Status: DC | PRN

## 2021-07-24 MED ORDER — MECLIZINE HCL 12.5 MG PO TABS
25.0000 mg | ORAL_TABLET | Freq: Three times a day (TID) | ORAL | Status: DC
  Administered 2021-07-25: 25 mg via ORAL
  Filled 2021-07-24: qty 2

## 2021-07-24 MED ORDER — ACETAMINOPHEN 325 MG PO TABS: 650.0000 mg | ORAL_TABLET | ORAL | Status: DC | PRN

## 2021-07-24 MED ORDER — LACTATED RINGERS IV BOLUS
1000.0000 mL | Freq: Once | INTRAVENOUS | Status: AC
  Administered 2021-07-24: 1000 mL via INTRAVENOUS

## 2021-07-24 MED ORDER — LORAZEPAM 2 MG/ML IJ SOLN
0.5000 mg | Freq: Once | INTRAMUSCULAR | Status: AC
  Administered 2021-07-24: 0.5 mg via INTRAVENOUS
  Filled 2021-07-24: qty 1

## 2021-07-24 MED ORDER — SENNOSIDES-DOCUSATE SODIUM 8.6-50 MG PO TABS: 1.0000 | ORAL_TABLET | Freq: Every evening | ORAL | Status: DC | PRN

## 2021-07-24 MED ORDER — UMECLIDINIUM-VILANTEROL 62.5-25 MCG/ACT IN AEPB
1.0000 | INHALATION_SPRAY | Freq: Every day | RESPIRATORY_TRACT | Status: DC
  Administered 2021-07-25: 1 via RESPIRATORY_TRACT
  Filled 2021-07-24: qty 14

## 2021-07-24 MED ORDER — ALBUTEROL SULFATE (2.5 MG/3ML) 0.083% IN NEBU: 3.0000 mL | INHALATION_SOLUTION | Freq: Four times a day (QID) | RESPIRATORY_TRACT | Status: DC | PRN

## 2021-07-24 NOTE — Assessment & Plan Note (Signed)
Continue lisinopril
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
## 2021-07-24 NOTE — Assessment & Plan Note (Signed)
-   Continue as needed albuterol - Continue Anoro - Counseled on the importance of smoking cessation

## 2021-07-24 NOTE — Assessment & Plan Note (Signed)
Continue Protonix °

## 2021-07-24 NOTE — Assessment & Plan Note (Signed)
-   Patient is actively trying to quit, advised importance of cessation, start nicotine patch

## 2021-07-24 NOTE — Progress Notes (Signed)
1244 call time 1245 beeper time 1253 exam started 1255 exam finished 1255 images sent to soc 1257 exam completed in epic 1256 [facility] radiology called

## 2021-07-24 NOTE — ED Notes (Signed)
POC pregnancy test is negative. Nurse notified

## 2021-07-24 NOTE — ED Notes (Signed)
Sutures placed on right shin by PA, area cleanse with NS, applied protective dressing

## 2021-07-24 NOTE — ED Triage Notes (Signed)
Patient brought in by [facility] for a fall.  Patient stated that she was going up the steps to go into the house.  States she felt dizzy and there was no rails.  Patient fell and landed on her right leg. Patient states that she lost a lot of blood and said the site was squirting.  Site was wrapped by rescue prior to EMS arrival.  Patient is conscious and alert.

## 2021-07-24 NOTE — ED Provider Notes (Signed)
[facility] EMERGENCY DEPARTMENT Provider Note   CSN: [id number] Arrival date & time: 07/24/21  1139  An emergency department physician performed an initial assessment on this suspected stroke patient at 1243.  History  Chief Complaint  Patient presents with   Alice [name] is a 56 y.o. female with history of COPD, multiple prior strokes, hyperlipidemia, known heart murmur, HTN.  Presenting with chief complaint of fall around 9:30 AM this morning.  Prior to the fall, she suddenly felt dizzy before going outside.  Sat down for 1 to 2 minutes to see if it would pass, then went outside and down 5-6 stairs.  Was on her way back up the stairs when the dizziness came on stronger which caused her to lose her balance.  There were no hand rails to hold onto, so she fell onto her left side.  Denies hitting head, or LOC.  Got herself up and to the top of the stairs when she noticed her right pant leg was soaked with blood.  Rolled up her pant leg and noticed blood spurting out 3 to 4 feet.  Immediately applied pressure.  Was able to get to a phone and call 911.  Felt mildly short of breath when she was hyperventilating, but denies chest pain or shortness of breath.  Has noted to additional waves of dizziness since arriving to ED.  Denies vision changes, headache, N/V, abdominal pain, fevers, or stiff neck.  Endorses previous deficits of right and left lower extremities due to prior CVAs.  States the mild decreased sensation of the right leg is why she did not feel the wound/injury on the shin.  However she endorses new left lower extremity weakness and numbness since the fall.  Also describes random sudden, intermittent 30-60 min episodes of dizziness over the last 1-2 weeks.   Was seen in ED 07/18/21 due to atypical chest pain, hiatal hernia, and dehydration.  Has been staying well hydrated since then.  The history is provided by the patient and medical records.  Fall      Home  Medications Prior to Admission medications   Medication Sig Start Date End Date Taking? Authorizing Provider  albuterol (VENTOLIN HFA) 108 (90 Base) MCG/ACT inhaler Inhale 2 puffs into the lungs every 6 (six) hours as needed for wheezing or shortness of breath. 06/01/21  Yes Emokpae, Courage, MD  aspirin EC 81 MG tablet Take 1 tablet (81 mg total) by mouth daily with breakfast. For Stroke Prevention 06/01/21  Yes Emokpae, Courage, MD  azithromycin (ZITHROMAX) 250 MG tablet Take 1 tablet (250 mg total) by mouth daily. Take first 2 tablets together, then 1 every day until finished. 07/18/21  Yes Linwood Dibbles, MD  baclofen (LIORESAL) 10 MG tablet Take 1 tablet (10 mg total) by mouth 3 (three) times daily. Patient taking differently: Take 10 mg by mouth 2 (two) times daily. 02/25/21  Yes Daryll Drown, NP  gabapentin (NEURONTIN) 100 MG capsule Take 100 mg by mouth 2 (two) times daily. 01/13/21  Yes [provider]  lisinopril (ZESTRIL) 10 MG tablet Take 10 mg by mouth daily. 06/15/21  Yes [provider]  pantoprazole (PROTONIX) 40 MG tablet Take 1 tablet (40 mg total) by mouth daily. 07/18/21  Yes Linwood Dibbles, MD  SUBOXONE 8-2 MG FILM Place 0.5 Film under the tongue every 6 (six) hours. 10/15/20  Yes [provider]  umeclidinium-vilanterol (ANORO ELLIPTA) 62.5-25 MCG/ACT AEPB Inhale 1 puff into the lungs daily. For COPD  06/01/21  Yes Shon Hale, MD      Allergies    Benadryl [diphenhydramine hcl], Codeine, and Tetracycline    Review of Systems   Review of Systems  Neurological:  Positive for dizziness, weakness and numbness.    Physical Exam Updated Vital Signs BP 122/80   Pulse 63   Temp 97.9 F (36.6 C) (Oral)   Resp 14   Ht 6' (1.829 m)   Wt 89.8 kg   LMP 07/30/2011   SpO2 99%   BMI 26.85 kg/m  Physical Exam Vitals and nursing note reviewed.  Constitutional:      General: She is not in acute distress.    Appearance: Normal appearance. She is  well-developed. She is not ill-appearing, toxic-appearing or diaphoretic.  HENT:     Head: Normocephalic and atraumatic.     Right Ear: External ear normal.     Left Ear: External ear normal.     Nose: Nose normal.     Mouth/Throat:     Mouth: Mucous membranes are moist.     Pharynx: Oropharynx is clear.  Eyes:     General: Lids are normal. Vision grossly intact. Gaze aligned appropriately. No visual field deficit.    Extraocular Movements: Extraocular movements intact.     Right eye: No nystagmus.     Left eye: No nystagmus.     Conjunctiva/sclera: Conjunctivae normal.     Pupils: Pupils are equal, round, and reactive to light.     Visual Fields: Right eye visual fields normal and left eye visual fields normal.  Neck:     Comments: Very supple on exam Cardiovascular:     Rate and Rhythm: Normal rate and regular rhythm.     Pulses: Normal pulses.     Heart sounds: Normal heart sounds. No murmur heard. Pulmonary:     Effort: Pulmonary effort is normal. No respiratory distress.     Breath sounds: Normal breath sounds.  Chest:     Chest wall: No tenderness.  Abdominal:     Palpations: Abdomen is soft.     Tenderness: There is no abdominal tenderness.  Musculoskeletal:        General: No swelling.     Cervical back: Neck supple. No rigidity or tenderness.     Right lower leg: No edema.     Left lower leg: No edema.  Skin:    General: Skin is warm and dry.     Capillary Refill: Capillary refill takes less than 2 seconds.     Findings: Laceration present.     Comments: 1.5 inch laceration to the right anterior mid-shin.  Bleeding controlled.  Without evidence of surrounding infection.  No foreign body visualized.  Neurological:     Mental Status: She is alert and oriented to person, place, and time.     GCS: GCS eye subscore is 4. GCS verbal subscore is 5. GCS motor subscore is 6.     Cranial Nerves: No dysarthria or facial asymmetry.     Sensory: Sensory deficit (LLE) present.      Motor: Weakness (LLE) present. No tremor or seizure activity.     Coordination: Coordination is intact. Coordination normal. Finger-Nose-Finger Test and Heel to [facility] Test normal.  Psychiatric:        Mood and Affect: Mood normal.     ED Results / Procedures / Treatments   Labs (all labs ordered are listed, but only abnormal results are displayed) Labs Reviewed  COMPREHENSIVE METABOLIC PANEL - Abnormal; Notable  for the following components:      Result Value   Glucose, Bld 101 (*)    All other components within normal limits  URINALYSIS, ROUTINE W REFLEX MICROSCOPIC - Abnormal; Notable for the following components:   Color, Urine STRAW (*)    All other components within normal limits  I-STAT CHEM 8, ED - Abnormal; Notable for the following components:   Calcium, Ion 1.12 (*)    All other components within normal limits  RESP PANEL BY RT-PCR (FLU A&B, COVID) ARPGX2  PROTIME-INR  APTT  RAPID URINE DRUG SCREEN, HOSP PERFORMED  CBC WITH DIFFERENTIAL/PLATELET  ETHANOL  CBC  DIFFERENTIAL  POC URINE PREG, ED  CBG MONITORING, ED    EKG EKG Interpretation  Date/Time:  Friday July 24 2021 12:05:08 EDT Ventricular Rate:  69 PR Interval:  161 QRS Duration: 106 QT Interval:  384 QTC Calculation: 412 R Axis:   44 Text Interpretation: Sinus rhythm Abnormal R-wave progression, early transition Confirmed by Alvester Chou [phone]) on 07/24/2021 1:22:11 PM  Radiology MR BRAIN WO CONTRAST  Result Date: 07/24/2021 CLINICAL DATA:  Neuro deficit, acute, stroke suspected Dizziness, persistent/recurrent, cardiac or vascular cause suspected EXAM: MRI HEAD WITHOUT CONTRAST TECHNIQUE: Multiplanar, multiecho pulse sequences of the brain and surrounding structures were obtained without intravenous contrast. COMPARISON:  Same day CT head. FINDINGS: Brain: No acute infarction, hemorrhage, hydrocephalus, extra-axial collection or mass lesion. Remote high left parietal cortical infarct. A few  scattered T2/FLAIR hyperintensities in the white matter, nonspecific but considered within normal limits for patient age. Vascular: Major arterial flow voids are maintained at the skull base. Skull and upper cervical spine: Normal marrow signal. Sinuses/Orbits: Clear sinuses.  No acute orbital findings. Other: No mastoid effusions. IMPRESSION: 1. No evidence of acute intracranial abnormality. 2. Remote high left parietal cortical infarct. Electronically Signed   By: Feliberto Harts M.D.   On: 07/24/2021 14:32   DG Tibia/Fibula Right  Result Date: 07/24/2021 CLINICAL DATA:  Pain after fall EXAM: RIGHT TIBIA AND FIBULA - 2 VIEW COMPARISON:  None Available. FINDINGS: Degenerative changes in all 3 compartments of the knee. Irregularity of the distal medial malleolus is likely sequela of previous avulsion injury. No fractures or dislocations.  No other abnormalities. 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 Sequela of remote avulsion injury off the medial malleolus. No acute fractures or soft tissue swelling identified. Electronically Signed   By: Gerome Sam III M.D.   On: 07/24/2021 13:52   CT HEAD CODE STROKE WO CONTRAST  Addendum Date: 07/24/2021   ADDENDUM REPORT: 07/24/2021 13:09 ADDENDUM: Code stroke imaging results were communicated on 07/24/2021 at 1:08 pm to provider Trifan via telephone, who verbally acknowledged these results. Electronically Signed   By: Feliberto Harts M.D.   On: 07/24/2021 13:09   Result Date: 07/24/2021 CLINICAL DATA:  Code stroke.  Neuro deficit, acute, stroke suspected EXAM: CT HEAD WITHOUT CONTRAST TECHNIQUE: Contiguous axial images were obtained from the base of the skull through the vertex without intravenous contrast. RADIATION DOSE REDUCTION: This exam was performed according to the departmental dose-optimization program which includes automated exposure control, adjustment of the mA and/or kV according to patient size and/or use of iterative  reconstruction technique. COMPARISON:  CT head May 31, 2021. FINDINGS: Brain: No evidence of acute large vascular territory infarction, hemorrhage, hydrocephalus, extra-axial collection or mass lesion/mass effect. Similar small areas of encephalomalacia in the left parietal cortex. Vascular: No hyperdense vessel identified. Skull: No acute fracture. Sinuses/Orbits: Clear sinuses.  No acute  orbital findings. Other: No mastoid effusions. ASPECTS [facility]([place] Stroke Program Early CT Score) total score (0-10 with 10 being normal): 10. IMPRESSION: 1. No evidence of acute intracranial abnormality. ASPECTS is 10. 2. Similar small remote left parietal cortical infarcts. Electronically Signed: By: Feliberto HartsFrederick S Jones M.D. On: 07/24/2021 13:02    Procedures .Marland Kitchen.Laceration Repair  Date/Time: 07/24/2021 8:31 PM  Performed by: Cecil Cobbsockerham, Arnez Stoneking M, PA-C Authorized by: Cecil Cobbsockerham, Myalynn Lingle M, PA-C   Consent:    Consent obtained:  Verbal   Consent given by:  Patient   Risks discussed:  Infection, pain, need for additional repair, poor cosmetic result and poor wound healing   Alternatives discussed:  No treatment and delayed treatment Universal protocol:    Procedure explained and questions answered to patient or proxy's satisfaction: yes     Patient identity confirmed:  Verbally with patient and arm band Anesthesia:    Anesthesia method:  Local infiltration   Local anesthetic:  Lidocaine 2% WITH epi Laceration details:    Location:  Leg   Leg location:  R lower leg   Length (cm):  2   Depth (mm):  2 Pre-procedure details:    Preparation:  Patient was prepped and draped in usual sterile fashion and imaging obtained to evaluate for foreign bodies Exploration:    Hemostasis achieved with:  Direct pressure   Imaging obtained: x-ray     Imaging outcome: foreign body not noted     Wound exploration: wound explored through full range of motion     Wound extent: no fascia violation noted, no foreign bodies/material  noted, no muscle damage noted, no tendon damage noted and no underlying fracture noted   Treatment:    Area cleansed with:  Betadine   Amount of cleaning:  Standard   Irrigation solution:  Sterile saline   Irrigation method:  Syringe Skin repair:    Repair method:  Sutures   Suture size:  4-0   Suture material:  Prolene   Suture technique:  Simple interrupted   Number of sutures:  2 Approximation:    Approximation:  Close Repair type:    Repair type:  Simple Post-procedure details:    Dressing:  Antibiotic ointment and sterile dressing   Procedure completion:  Tolerated well, no immediate complications Comments:     Bleeding well controlled.  Procedure tolerated without complications.     Medications Ordered in ED Medications  aspirin EC tablet 650 mg (650 mg Oral Given 07/24/21 1438)  meclizine (ANTIVERT) tablet 25 mg (25 mg Oral Given 07/24/21 1555)  prochlorperazine (COMPAZINE) injection 10 mg (10 mg Intravenous Given 07/24/21 1747)  LORazepam (ATIVAN) injection 0.5 mg (0.5 mg Intravenous Given 07/24/21 1753)  lactated ringers bolus 1,000 mL (1,000 mLs Intravenous New Bag/Given 07/24/21 1747)  lidocaine-EPINEPHrine (XYLOCAINE W/EPI) 2 %-1:200000 (PF) injection 10 mL (10 mLs Infiltration Given 07/24/21 1902)  Tdap (BOOSTRIX) injection 0.5 mL (0.5 mLs Intramuscular Given 07/24/21 1900)    ED Course/ Medical Decision Making/ A&P Clinical Course as of 07/24/21 2035  Fri Jul 24, 2021  1241 Patient is a 56 year old with history of CVA, peripheral right-sided lower extremity deficits, presented to ED with complaint of left-sided paresthesias.  She reports she has had intermittent dizziness for the past 2 to 3 days, describes sensation of listing to the right side.  Today she fell while trying to walk her dog, thinks that she caught her struck something across her right leg, she was "spurting blood" out the anterior side of a wound in her  right lower leg.  Subsequently she noted that her  left leg was having generalized numbness, from the hip all the way down to the leg, which is a new deficit.  This occurred around 930 am.  On exam the patient does not have evidence of LVO.  She does report paresthesias of the left lower extremity, although she reports chronic paresthesias from below the bilateral knees, now she has paresthesia up further on the left leg.  Given the timing of her symptoms a code stroke was activated.  She has a small punctate wound to the right anterior lower leg, which is not actively bleeding.  She has brisk distal pedal pulses.  We will obtain an x-ray to evaluate for retained foreign body, as it is not clear what may have lacerated or punctured into her leg. [MT]  1309 No acute CVA on CT scan [MT]  1354 I spoke to Dr Otelia Limes from neurology who recommends an MRI of the brain.  If this is unremarkable, there is no need for repeat CT angiogram or vascular imaging as the patient had this done 2 months ago.  The patient could be evaluated for other causes of her dizziness. [MT]    Clinical Course User Index [MT] Trifan, Kermit Balo, MD                           Medical Decision Making Amount and/or Complexity of Data Reviewed Labs: ordered. Radiology: ordered.  Risk Prescription drug management. Decision regarding hospitalization.   56 y.o. female presents to the ED for concern of Fall   This involves an extensive number of treatment options, and is a complaint that carries with it a high risk of complications and morbidity.  The emergent differential diagnosis prior to evaluation includes, but is not limited to: CVA, arrhythmia, BPPV, arts  This is not an exhaustive differential.   Past Medical History / Co-morbidities / Social History: Hx of COPD, multiple prior strokes, hyperlipidemia, known heart murmur, HTN Social Determinants of Health include transportation difficulty due to diagnose seizures, unable to drive.  Utilizes family numbers and friends for  transportation without issue  Additional History:  Internal and external records from outside source obtained and reviewed including ED visits  Lab Tests: I ordered, and personally interpreted labs.  The pertinent results include:   CBC, CMP, APTT, PT/INR, UA: Unremarkable COVID, flu: Negative UDS and ethanol negative I-STAT Chem-8 unremarkable CBG 92 Urine pregnancy negative  Imaging Studies: I ordered imaging studies including CT head and MRI brain, with XR right tib-fib.   I independently visualized and interpreted imaging which showed CT head and MRI brain negative for evidence of acute infarct or other acute pathology.  X-ray right tib-fib negative for fracture or foreign body. I agree with the radiologist interpretation.  ED Course: Pt well-appearing on exam.  Nonseptic, not ill-appearing in NAD.  Recently fell going up a few stairs due to sudden onset dizziness.  Hx of multiple CVAs.  New neurodeficits of left leg decreased sensation and weakness.  Possible pronator drift, indeterminable based off of repeat exams.  Otherwise unremarkable neuro exam.  Normal EOMs, PERRLA, without facial asymmetry or aphasia or dysarthria.  Without chest pain or shortness of breath.  Without irregular rate or rhythm.  Low initial suspicion for ACS or arrhythmia etiology contributing to dizziness.  Abdomen soft nontender.  Also with laceration to the right shin without active bleeding, bandaged by EMS before arrival.  Discussed  case with attending, code stroke initiated. CT and MRI imaging negative for evidence of acute infarct or ischemia.  Code stroke activated.  Patient still with intermittent dizziness.  Discussed case with attending, plan to proceed with trial of Antivert and reassess.  Tdap updated. Upon reevaluation, patient still complaining of dizziness after Antivert administration.  Discussed case with attending, plan to proceed with migraine cocktail and reassess.  Laceration repair performed as  described above. Reevaluation following migraine cocktail shows no improvement per patient.  Unsure of etiology at this time.  Recommend admission for intractable dizziness.  Disposition: 2000: care of [name] transferred to [facility] and Dr. Posey Rea at the end of my shift.  Patient case discussed at length.  Please see his/her note for further details.  Plan at time of handoff is admission for intractable dizziness.  This may be altered or completely changed at the discretion of the oncoming team pending results of further workup.  I discussed this case with my attending physician Dr. Renaye Rakers and Dr. Posey Rea, who agreed with the proposed treatment course and cosigned this note including patient's presenting symptoms, physical exam, and planned diagnostics and interventions.  Attending physician stated agreement with plan or made changes to plan which were implemented.     This chart was dictated using voice recognition software.  Despite best efforts to proofread, errors can occur which can change the documentation meaning.         Final Clinical Impression(s) / ED Diagnoses Final diagnoses:  Dizziness  Fall, initial encounter    Rx / DC Orders ED Discharge Orders     None         Sandrea Hammond 07/24/21 2035    Terald Sleeper, MD 07/25/21 [phone]

## 2021-07-24 NOTE — Consult Note (Addendum)
1309 Stroke cart activated 1311 Neuro paged 1319 Dr Georg Ruddle joins stroke cart  Mrs 1.  Pt with previous hx of stroke with some residudal deficits

## 2021-07-24 NOTE — ED Notes (Signed)
Patient states that she had a history of seizures and strokes. Patient reports that she hasn't taken her ASA in 5 days.
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
## 2021-07-24 NOTE — ED Notes (Signed)
Wound cleanse to right shin applied dressing

## 2021-07-24 NOTE — Plan of Care (Signed)

## 2021-07-24 NOTE — Assessment & Plan Note (Signed)
-   Resulting in fall - No recent URI, ear pain, ringing in ears - MRI brain without acute changes - Check ultrasound carotids, check echo - Avoid Ativan as patient becomes more agitated - Continue meclizine - 1 dose of Valium did not help - Holding medications that can contribute to vertigo including Suboxone, baclofen, gabapentin - UDS negative, UA negative - Continue to monitor

## 2021-07-24 NOTE — ED Notes (Addendum)
Pt to CT via stretcher, alert, NAD, calm, interactive, resps e/u. Labs obtained. IV unsuccessful x2.

## 2021-07-24 NOTE — ED Provider Notes (Signed)
Care assumed from PA [place] at shift change, please see her note for full details.  Patient arrived as code stroke, fortunately stroke work-up was negative but despite multiple agents patient has been persistently dizzy.  She sustained a fall due to dizziness and had a small laceration that was repaired with sutures.   Care signed out pending reassessment after additional medications for dizziness, and unfortunately despite these medications patient persistently dizzy.  Hospitalist consulted for intractable vertigo.  Case discussed with Dr. Victorino Dike who will see and admit the patient.   Dartha Lodge, PA-C 07/24/21 2101    Glendora Score, MD 07/25/21 [phone]

## 2021-07-24 NOTE — Assessment & Plan Note (Signed)
-   Secondary to dizziness - No known history of vertigo per patient - Antivert did not help - Ativan as patient to be agitated - Valium did not relieve vertigo either - MRI brain shows no evidence of acute intracranial abnormality.  Remote high left parietal cortical infarct - PT eval and treat -Continue meclizine

## 2021-07-24 NOTE — ED Notes (Signed)
Attempted IV, unsuccessful x1. EDP at [facility].

## 2021-07-25 ENCOUNTER — Observation Stay (HOSPITAL_BASED_OUTPATIENT_CLINIC_OR_DEPARTMENT_OTHER): Payer: Medicaid Other

## 2021-07-25 ENCOUNTER — Observation Stay (HOSPITAL_COMMUNITY): Payer: Medicaid Other

## 2021-07-25 DIAGNOSIS — I63339 Cerebral infarction due to thrombosis of unspecified posterior cerebral artery: Secondary | ICD-10-CM | POA: Diagnosis not present

## 2021-07-25 DIAGNOSIS — I6523 Occlusion and stenosis of bilateral carotid arteries: Secondary | ICD-10-CM | POA: Diagnosis not present

## 2021-07-25 DIAGNOSIS — R42 Dizziness and giddiness: Secondary | ICD-10-CM | POA: Diagnosis not present

## 2021-07-25 DIAGNOSIS — R55 Syncope and collapse: Secondary | ICD-10-CM | POA: Diagnosis not present

## 2021-07-25 LAB — CBC
HCT: 36.4 % (ref 36.0–46.0)
Hemoglobin: 12.3 g/dL (ref 12.0–15.0)
MCH: 30.9 pg (ref 26.0–34.0)
MCHC: 33.8 g/dL (ref 30.0–36.0)
MCV: 91.5 fL (ref 80.0–100.0)
Platelets: 202 10*3/uL (ref 150–400)
RBC: 3.98 MIL/uL (ref 3.87–5.11)
RDW: 12.3 % (ref 11.5–15.5)
WBC: 6.3 10*3/uL (ref 4.0–10.5)
nRBC: 0 % (ref 0.0–0.2)

## 2021-07-25 LAB — ECHOCARDIOGRAM COMPLETE
AR max vel: 2.81 cm2
AV Area VTI: 2.87 cm2
AV Area mean vel: 2.68 cm2
AV Mean grad: 3 mmHg
AV Peak grad: 6.3 mmHg
Ao pk vel: 1.25 m/s
Area-P 1/2: 3.93 cm2
Height: 72 in
MV M vel: 2.1 m/s
MV Peak grad: 17.6 mmHg
MV VTI: 0.43 cm2
S' Lateral: 3.1 cm
Weight: 3168 oz

## 2021-07-25 LAB — COMPREHENSIVE METABOLIC PANEL
ALT: 8 U/L (ref 0–44)
AST: 12 U/L — ABNORMAL LOW (ref 15–41)
Albumin: 3.6 g/dL (ref 3.5–5.0)
Alkaline Phosphatase: 75 U/L (ref 38–126)
Anion gap: 6 (ref 5–15)
BUN: 8 mg/dL (ref 6–20)
CO2: 28 mmol/L (ref 22–32)
Calcium: 8.9 mg/dL (ref 8.9–10.3)
Chloride: 108 mmol/L (ref 98–111)
Creatinine, Ser: 0.64 mg/dL (ref 0.44–1.00)
GFR, Estimated: >60 mL/min (ref >60)
Glucose, Bld: 106 mg/dL — ABNORMAL HIGH (ref 70–99)
Potassium: 3.7 mmol/L (ref 3.5–5.1)
Sodium: 142 mmol/L (ref 135–145)
Total Bilirubin: 0.5 mg/dL (ref 0.3–1.2)
Total Protein: 7 g/dL (ref 6.5–8.1)

## 2021-07-25 LAB — LIPID PANEL
Cholesterol: 192 mg/dL (ref 0–200)
HDL: 43 mg/dL (ref >40)
LDL Cholesterol: 130 mg/dL — ABNORMAL HIGH (ref 0–99)
Total CHOL/HDL Ratio: 4.5 RATIO
Triglycerides: 93 mg/dL (ref <150)
VLDL: 19 mg/dL (ref 0–40)

## 2021-07-25 MED ORDER — ASPIRIN 81 MG PO TBEC: 81.0000 mg | DELAYED_RELEASE_TABLET | Freq: Every day | ORAL | 11 refills | Status: AC

## 2021-07-25 MED ORDER — BACLOFEN 10 MG PO TABS: 10.0000 mg | ORAL_TABLET | Freq: Two times a day (BID) | ORAL | 3 refills | Status: AC

## 2021-07-25 MED ORDER — NICOTINE 14 MG/24HR TD PT24: 14.0000 mg | MEDICATED_PATCH | Freq: Every day | TRANSDERMAL | 0 refills | Status: DC

## 2021-07-25 MED ORDER — BUPRENORPHINE HCL-NALOXONE HCL 8-2 MG SL SUBL
1.0000 | SUBLINGUAL_TABLET | Freq: Two times a day (BID) | SUBLINGUAL | Status: DC
  Administered 2021-07-25: 1 via SUBLINGUAL
  Filled 2021-07-25: qty 1

## 2021-07-25 MED ORDER — ALBUTEROL SULFATE HFA 108 (90 BASE) MCG/ACT IN AERS: 2.0000 | INHALATION_SPRAY | Freq: Four times a day (QID) | RESPIRATORY_TRACT | 2 refills | Status: DC | PRN

## 2021-07-25 MED ORDER — UMECLIDINIUM-VILANTEROL 62.5-25 MCG/ACT IN AEPB: 1.0000 | INHALATION_SPRAY | Freq: Every day | RESPIRATORY_TRACT | 3 refills | Status: DC

## 2021-07-25 MED ORDER — LISINOPRIL 10 MG PO TABS: 10.0000 mg | ORAL_TABLET | Freq: Every day | ORAL | 3 refills | Status: DC

## 2021-07-25 MED ORDER — GABAPENTIN 100 MG PO CAPS
100.0000 mg | ORAL_CAPSULE | Freq: Three times a day (TID) | ORAL | Status: DC
Start: 2021-07-25 — End: 2021-07-25
  Administered 2021-07-25: 100 mg via ORAL
  Filled 2021-07-25: qty 1

## 2021-07-25 MED ORDER — BACLOFEN 10 MG PO TABS
10.0000 mg | ORAL_TABLET | Freq: Three times a day (TID) | ORAL | Status: DC
  Administered 2021-07-25: 10 mg via ORAL
  Filled 2021-07-25: qty 1

## 2021-07-25 MED ORDER — PANTOPRAZOLE SODIUM 40 MG PO TBEC: 40.0000 mg | DELAYED_RELEASE_TABLET | Freq: Every day | ORAL | 3 refills | Status: DC

## 2021-07-25 NOTE — TOC Transition Note (Signed)
Transition of Care [facility]) - CM/SW Discharge Note   Patient Details  Name: Alice [name] MRN: [id number] Date of Birth: [birth year]  Transition of Care [facility]) CM/SW Contact:  Larrie Kass, LCSW Phone Number: 07/25/2021, 2:17 PM   Clinical Narrative:    CSW spoke with pt about PT recommendations for [facility] services, pt stated she attends OP PT. pt stated she does not know if the person she stays with will allow these services in the home. pt stated she will decide when the [facility] agency contact her to start services. SW spoke with Denyse Amass with [facility], he has accepted pt for HHPT. Pt is requesting a ride home, pt can receive a taxi voucher for d/c. TOC sign off.     Final next level of care: Home w Home Health Services Barriers to Discharge: No Barriers Identified   Patient Goals and CMS Choice Patient states their goals for this hospitalization and ongoing recovery are:: retrun home CMS Medicare.gov Compare Post Acute Care list provided to:: Patient Choice offered to / list presented to : Patient  Discharge Placement                       Discharge Plan and Services                          HH Arranged: PT [facility] Agency: [facility] Date [facility] Agency Contacted: 07/25/21 Time HH Agency Contacted: 1000 Representative spoke with at [facility] Agency: Denyse Amass  Social Determinants of Health (SDOH) Interventions     Readmission Risk Interventions    10/29/2020   10:25 AM  Readmission Risk Prevention Plan  Transportation Screening Complete  Home Care Screening Complete  Medication Review (RN CM) Complete

## 2021-07-25 NOTE — Discharge Summary (Signed)
Alice [name], is a 56 y.o. female  DOB [birth year]  MRN [id number].  Admission date:  07/24/2021  Admitting Physician  Lilyan GilfordAsia B Zierle-Ghosh, DO  Discharge Date:  07/25/2021   Primary MD  Daryll DrownIjaola, Onyeje M, NP  Recommendations for primary care physician for things to follow:   1)Right leg wound care as advised--keep wound clean and dry 2) follow-up with neurologist Dr. Gerilyn [place] as previously advised 3)Avoid ibuprofen/Advil/Aleve/Motrin/Goody Powders/Naproxen/BC powders/Meloxicam/Diclofenac/Indomethacin and other Nonsteroidal anti-inflammatory medications as these will make you more likely to bleed and can cause stomach ulcers, can also cause Kidney problems.   Admission Diagnosis  Dizziness [R42] Vertigo [R42] Fall, initial encounter [W19.XXXA]   Discharge Diagnosis  Dizziness [R42] Vertigo [R42] Fall, initial encounter [W19.XXXA]    Principal Problem:   Vertigo Active Problems:   HTN (hypertension)   TOBACCO ABUSE   COPD (chronic obstructive pulmonary disease) (HCC)   Fall at home, initial encounter   GERD (gastroesophageal reflux disease)      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   Qualifier: Diagnosis of  By: Zachary GeorgeSlaughter, Vicky     Chronic low back pain    Cocaine abuse (HCC)    COPD (chronic obstructive pulmonary disease) (HCC)    Heart murmur    HLD (hyperlipidemia)    Hypertension    Stroke (HCC)    x4    Past Surgical History:  Procedure Laterality Date   ABDOMINAL HYSTERECTOMY     abd?   BACK SURGERY     hardware in back   cyst removed from ovary     FRACTURE SURGERY     LUMBAR SPINE   removal of BB in finger  2008   SALIVARY GLAND SURGERY     tumor reomoved from stomach       HPI  from the history and physical done on the day of admission:   HPI: Alice PereyraDoris [name] is a 56 y.o. female with medical history significant of anemia, cocaine abuse, COPD,  hyperlipidemia, hypertension, stroke the ED with a chief complaint of fall.  Patient reports that she has been extremely dizzy which is resulted in a fall.  She has 5 steps to get in her home, she was 3 steps up when she started to get really dizzy, so she tried to make the last 2 steps before sitting down.  She fell on her legs resulting in a right laceration in the middle of her tibia region, and she fell off the steps and landed on the ground as there is no handrail to stop her.  Patient reports that she did not hit her head, she did not lose consciousness, the dizziness was unchanged.  She has never had dizziness like this before.  She reports that the room is spinning around her.  She does not have associated chest pain, palpitations, dyspnea, syncope.  She had no recent illness, no ear pain, no ringing in her ears.  Her mom did have vertigo.  Patient reports that meclizine did not touch her  symptoms.  Ativan made her more agitated.  Valium has given her double vision per her report.  Patient denies any nausea but reports that she was so hungry, but when she saw food she could not eat because she was too dizzy.  Patient reports she is otherwise been in her normal state of health.   Patient is a current smoker half a pack per day.  She does not drink alcohol.  She denies illicit drug use, and UDS is negative.  She is not vaccinated for COVID.  Patient is full code. Review of Systems: As mentioned in the history of present illness. All other systems reviewed and are negative.    Hospital Course:   Assessment and Plan: * Vertigo - Resulting in fall - No recent URI, ear pain, ringing in ears - MRI brain without acute changes -Echo with EF of 60 to 65% without regional wall motion abnormalities or diastolic dysfunction -Carotid artery Dopplers without hemodynamically significant stenosis - Suboxone, baclofen, gabapentin may have contributed to vertigo - UDS negative, UA negative -Outpatient  follow-up with neurologist Dr. Gerilyn Pilgrim advised  HTN (hypertension) - Continue lisinopril  GERD (gastroesophageal reflux disease) - Continue Protonix  Fall at home, initial encounter - Secondary to dizziness - No known history of vertigo per patient - Antivert did not help - Ativan as patient to be agitated - Valium did not relieve vertigo either - MRI brain shows no evidence of acute intracranial abnormality.  Remote high left parietal cortical infarct - PT eval appreciated recommends home health PT -Continue meclizine  COPD (chronic obstructive pulmonary disease) (HCC) - Continue as needed albuterol - Continue Anoro - Counseled on the importance of smoking cessation  TOBACCO ABUSE - Patient is actively trying to quit, advised importance of cessation, start nicotine patch   -Discharge home Discharge Condition: Stable  Follow UP--Dr. Gerilyn Pilgrim neurologist  Diet and Activity recommendation:  As advised  Discharge Instructions    Discharge Instructions     Call MD for:  difficulty breathing, headache or visual disturbances   Complete by: As directed    Call MD for:  difficulty breathing, headache or visual disturbances   Complete by: As directed    Call MD for:  persistant dizziness or light-headedness   Complete by: As directed    Call MD for:  persistant dizziness or light-headedness   Complete by: As directed    Call MD for:  persistant nausea and vomiting   Complete by: As directed    Call MD for:  persistant nausea and vomiting   Complete by: As directed    Call MD for:  severe uncontrolled pain   Complete by: As directed    Call MD for:  temperature >100.4   Complete by: As directed    Call MD for:  temperature >100.4   Complete by: As directed    Diet - low sodium heart healthy   Complete by: As directed    Diet - low sodium heart healthy   Complete by: As directed    Discharge instructions   Complete by: As directed    1   Discharge instructions    Complete by: As directed    1)Right leg wound care as advised--keep wound clean and dry 2) follow-up with neurologist Dr. Gerilyn Pilgrim as previously advised 3)Avoid ibuprofen/Advil/Aleve/Motrin/Goody Powders/Naproxen/BC powders/Meloxicam/Diclofenac/Indomethacin and other Nonsteroidal anti-inflammatory medications as these will make you more likely to bleed and can cause stomach ulcers, can also cause Kidney problems.   Discharge wound care:  Complete by: As directed    Keep right leg wound clean and dry   Discharge wound care:   Complete by: As directed    Right leg wound care as advised--keep wound clean and dry   Increase activity slowly   Complete by: As directed    Increase activity slowly   Complete by: As directed          Discharge Medications     Allergies as of 07/25/2021       Reactions   Benadryl [diphenhydramine Hcl] Hives   Codeine Rash   Tetracycline Itching, Rash        Medication List     STOP taking these medications    azithromycin 250 MG tablet Commonly known as: ZITHROMAX       TAKE these medications    albuterol 108 (90 Base) MCG/ACT inhaler Commonly known as: VENTOLIN HFA Inhale 2 puffs into the lungs every 6 (six) hours as needed for wheezing or shortness of breath.   aspirin EC 81 MG tablet Take 1 tablet (81 mg total) by mouth daily with breakfast. For Stroke Prevention   baclofen 10 MG tablet Commonly known as: LIORESAL Take 1 tablet (10 mg total) by mouth 2 (two) times daily.   gabapentin 100 MG capsule Commonly known as: NEURONTIN Take 100 mg by mouth 2 (two) times daily.   lisinopril 10 MG tablet Commonly known as: ZESTRIL Take 1 tablet (10 mg total) by mouth daily.   nicotine 14 mg/24hr patch Commonly known as: NICODERM CQ - dosed in mg/24 hours Place 1 patch (14 mg total) onto the skin daily. Start taking on: July 26, 2021   pantoprazole 40 MG tablet Commonly known as: Protonix Take 1 tablet (40 mg total) by mouth  daily.   Suboxone 8-2 MG Film Generic drug: Buprenorphine HCl-Naloxone HCl Place 0.5 Film under the tongue every 6 (six) hours.   umeclidinium-vilanterol 62.5-25 MCG/ACT Aepb Commonly known as: ANORO ELLIPTA Inhale 1 puff into the lungs daily. For COPD               Discharge Care Instructions  (From admission, onward)           Start     Ordered   07/25/21 0000  Discharge wound care:       Comments: Keep right leg wound clean and dry   07/25/21 1353   07/25/21 0000  Discharge wound care:       Comments: Right leg wound care as advised--keep wound clean and dry   07/25/21 1358            Major procedures and Radiology Reports - PLEASE review detailed and final reports for all details, in brief -   MR BRAIN WO CONTRAST  Result Date: 07/24/2021 CLINICAL DATA:  Neuro deficit, acute, stroke suspected Dizziness, persistent/recurrent, cardiac or vascular cause suspected EXAM: MRI HEAD WITHOUT CONTRAST TECHNIQUE: Multiplanar, multiecho pulse sequences of the brain and surrounding structures were obtained without intravenous contrast. COMPARISON:  Same day CT head. FINDINGS: Brain: No acute infarction, hemorrhage, hydrocephalus, extra-axial collection or mass lesion. Remote high left parietal cortical infarct. A few scattered T2/FLAIR hyperintensities in the white matter, nonspecific but considered within normal limits for patient age. Vascular: Major arterial flow voids are maintained at the skull base. Skull and upper cervical spine: Normal marrow signal. Sinuses/Orbits: Clear sinuses.  No acute orbital findings. Other: No mastoid effusions. IMPRESSION: 1. No evidence of acute intracranial abnormality. 2. Remote high left parietal cortical infarct.  Electronically Signed   By: Feliberto Harts M.D.   On: 07/24/2021 14:32   DG Tibia/Fibula Right  Result Date: 07/24/2021 CLINICAL DATA:  Pain after fall EXAM: RIGHT TIBIA AND FIBULA - 2 VIEW COMPARISON:  None Available. FINDINGS:  Degenerative changes in all 3 compartments of the knee. Irregularity of the distal medial malleolus is likely sequela of previous avulsion injury. No fractures or dislocations.  No other abnormalities. 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 Sequela of remote avulsion injury off the medial malleolus. No acute fractures or soft tissue swelling identified. Electronically Signed   By: Gerome Sam III M.D.   On: 07/24/2021 13:52   CT HEAD CODE STROKE WO CONTRAST  Addendum Date: 07/24/2021   ADDENDUM REPORT: 07/24/2021 13:09 ADDENDUM: Code stroke imaging results were communicated on 07/24/2021 at 1:08 pm to provider Trifan via telephone, who verbally acknowledged these results. Electronically Signed   By: Feliberto Harts M.D.   On: 07/24/2021 13:09   Result Date: 07/24/2021 CLINICAL DATA:  Code stroke.  Neuro deficit, acute, stroke suspected EXAM: CT HEAD WITHOUT CONTRAST TECHNIQUE: Contiguous axial images were obtained from the base of the skull through the vertex without intravenous contrast. RADIATION DOSE REDUCTION: This exam was performed according to the departmental dose-optimization program which includes automated exposure control, adjustment of the mA and/or kV according to patient size and/or use of iterative reconstruction technique. COMPARISON:  CT head May 31, 2021. FINDINGS: Brain: No evidence of acute large vascular territory infarction, hemorrhage, hydrocephalus, extra-axial collection or mass lesion/mass effect. Similar small areas of encephalomalacia in the left parietal cortex. Vascular: No hyperdense vessel identified. Skull: No acute fracture. Sinuses/Orbits: Clear sinuses.  No acute orbital findings. Other: No mastoid effusions. ASPECTS [facility] Stroke Program Early CT Score) total score (0-10 with 10 being normal): 10. IMPRESSION: 1. No evidence of acute intracranial abnormality. ASPECTS is 10. 2. Similar small remote left parietal cortical infarcts.  Electronically Signed: By: Feliberto Harts M.D. On: 07/24/2021 13:02   CT Angio Chest PE W and/or Wo Contrast  Result Date: 07/18/2021 CLINICAL DATA:  Positive D-dimer.  Pulmonary embolism suspected. EXAM: CT ANGIOGRAPHY CHEST WITH CONTRAST TECHNIQUE: Multidetector CT imaging of the chest was performed using the standard protocol during bolus administration of intravenous contrast. Multiplanar CT image reconstructions and MIPs were obtained to evaluate the vascular anatomy. RADIATION DOSE REDUCTION: This exam was performed according to the departmental dose-optimization program which includes automated exposure control, adjustment of the mA and/or kV according to patient size and/or use of iterative reconstruction technique. CONTRAST:  28mL OMNIPAQUE IOHEXOL 350 MG/ML SOLN COMPARISON:  None Available. FINDINGS: Cardiovascular: Satisfactory opacification of the pulmonary arteries to the segmental level. No evidence of pulmonary embolism. Normal heart size. No pericardial effusion. Mediastinum/Nodes: No enlarged mediastinal, hilar, or axillary lymph nodes. Thyroid gland and trachea demonstrate no significant findings. Moderate in size hiatal hernia with diffuse dilation of the esophagus. Lungs/Pleura: Small area of atelectasis versus airspace consolidation in the lateral aspect of the lingula. Possible mild interstitial pulmonary edema. Upper Abdomen: No acute abnormality. Musculoskeletal: Chronic thoracic spine fractures, mainly at T9-T10 with stable height loss when compared to MRI dated May 30, 2020. Review of the MIP images confirms the above findings. IMPRESSION: 1. No evidence of pulmonary embolus. 2. Small area of atelectasis versus airspace consolidation in the lateral aspect of the lingula. 3. Possible mild interstitial pulmonary edema. 4. Moderate in size hiatal hernia with diffuse dilation of the esophagus. 5. Chronic thoracic spine fractures. Electronically Signed  By: Ted Mcalpine M.D.   On:  07/18/2021 14:26   DG Chest Portable 1 View  Result Date: 07/18/2021 CLINICAL DATA:  Chest pain walking this morning.  Dizziness. EXAM: PORTABLE CHEST 1 VIEW COMPARISON:  05/23/2020 FINDINGS: The heart size and mediastinal contours appear stable. Low lung volumes. Chronic blunting the left costophrenic angle is favored to represent pleuroparenchymal scarring. Pulmonary vascular congestion noted. No overt edema. No airspace opacities. IMPRESSION: Low lung volumes with pulmonary vascular congestion. Electronically Signed   By: Signa Kell M.D.   On: 07/18/2021 09:30    Micro Results   Recent Results (from the past 240 hour(s))  Resp Panel by RT-PCR (Flu A&B, Covid) Anterior Nasal Swab     Status: None   Collection Time: 07/24/21 12:37 PM   Specimen: Anterior Nasal Swab  Result Value Ref Range Status   SARS Coronavirus 2 by RT PCR NEGATIVE NEGATIVE Final    Comment: (NOTE) SARS-CoV-2 target nucleic acids are NOT DETECTED.  The SARS-CoV-2 RNA is generally detectable in upper respiratory specimens during the acute phase of infection. The lowest concentration of SARS-CoV-2 viral copies this assay can detect is 138 copies/mL. A negative result does not preclude SARS-Cov-2 infection and should not be used as the sole basis for treatment or other patient management decisions. A negative result may occur with  improper specimen collection/handling, submission of specimen other than nasopharyngeal swab, presence of viral mutation(s) within the areas targeted by this assay, and inadequate number of viral copies(<138 copies/mL). A negative result must be combined with clinical observations, patient history, and epidemiological information. The expected result is Negative.  Fact Sheet for Patients:  BloggerCourse.com  Fact Sheet for Healthcare Providers:  SeriousBroker.it  This test is no t yet approved or cleared by the [place] FDA and   has been authorized for detection and/or diagnosis of SARS-CoV-2 by FDA under an Emergency Use Authorization (EUA). This EUA will remain  in effect (meaning this test can be used) for the duration of the COVID-19 declaration under Section 564(b)(1) of the Act, 21 U.S.C.section 360bbb-3(b)(1), unless the authorization is terminated  or revoked sooner.       Influenza A by PCR NEGATIVE NEGATIVE Final   Influenza B by PCR NEGATIVE NEGATIVE Final    Comment: (NOTE) The Xpert Xpress SARS-CoV-2/FLU/RSV plus assay is intended as an aid in the diagnosis of influenza from Nasopharyngeal swab specimens and should not be used as a sole basis for treatment. Nasal washings and aspirates are unacceptable for Xpert Xpress SARS-CoV-2/FLU/RSV testing.  Fact Sheet for Patients: BloggerCourse.com  Fact Sheet for Healthcare Providers: SeriousBroker.it  This test is not yet approved or cleared by the [place] FDA and has been authorized for detection and/or diagnosis of SARS-CoV-2 by FDA under an Emergency Use Authorization (EUA). This EUA will remain in effect (meaning this test can be used) for the duration of the COVID-19 declaration under Section 564(b)(1) of the Act, 21 U.S.C. section 360bbb-3(b)(1), unless the authorization is terminated or revoked.  Performed at [facility], [address], [place], [place] [address]     Today   Subjective    Alice [name] today has no new complaints  No fever  Or chills   No Nausea, Vomiting or Diarrhea -Resolved vertigo          Patient has been seen and examined prior to discharge   Objective   Blood pressure (!) 140/96, pulse 60, temperature 98 F (36.7 C), temperature source Oral, resp. rate  18, height 6' (1.829 m), weight 89.8 kg, last menstrual period 07/30/2011, SpO2 99 %.   Intake/Output Summary (Last 24 hours) at 07/25/2021 1358 Last data filed at 07/25/2021 0900 Gross per 24  hour  Intake 1240 ml  Output --  Net 1240 ml    Exam Gen:- Awake Alert, no acute distress  HEENT:- [place].AT, No sclera icterus Neck-Supple Neck,No JVD,.  Lungs-  CTAB , good air movement bilaterally CV- S1, S2 normal, regular Abd-  +ve B.Sounds, Abd Soft, No tenderness,    Extremity/Skin:- No  edema,   good pulses, right leg wound with dressing Psych-affect is appropriate, oriented x3 Neuro-no new focal deficits, no tremors    Data Review   CBC w Diff:  Lab Results  Component Value Date   WBC 6.3 07/25/2021   HGB 12.3 07/25/2021   HCT 36.4 07/25/2021   PLT 202 07/25/2021   LYMPHOPCT 17 07/24/2021   MONOPCT 5 07/24/2021   EOSPCT 3 07/24/2021   BASOPCT 1 07/24/2021    CMP:  Lab Results  Component Value Date   NA 142 07/25/2021   K 3.7 07/25/2021   CL 108 07/25/2021   CO2 28 07/25/2021   BUN 8 07/25/2021   CREATININE 0.64 07/25/2021   PROT 7.0 07/25/2021   ALBUMIN 3.6 07/25/2021   BILITOT 0.5 07/25/2021   ALKPHOS 75 07/25/2021   AST 12 (L) 07/25/2021   ALT 8 07/25/2021  .  Total Discharge time is about 33 minutes  Shon Hale M.D on 07/25/2021 at 1:58 PM  Go to [email or web address] -  for contact info  [facility] - Office  [phone]
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
## 2021-07-25 NOTE — Plan of Care (Signed)
  Problem: Acute Rehab PT Goals(only PT should resolve) Goal: Pt Will Go Supine/Side To Sit Outcome: Progressing Flowsheets (Taken 07/25/2021 0932) Pt will go Supine/Side to Sit: with supervision Goal: Patient Will Transfer Sit To/From Stand Outcome: Progressing Flowsheets (Taken 07/25/2021 0932) Patient will transfer sit to/from stand: with supervision Goal: Pt Will Transfer Bed To Chair/Chair To Bed Outcome: Progressing Flowsheets (Taken 07/25/2021 0932) Pt will Transfer Bed to Chair/Chair to Bed: with supervision Goal: Pt Will Ambulate Outcome: Progressing Flowsheets (Taken 07/25/2021 0932) Pt will Ambulate:  100 feet  with supervision  with rolling walker

## 2021-07-25 NOTE — Evaluation (Signed)
Physical Therapy Evaluation Patient Details Name: Alice [name] MRN: [id number] DOB: 07-31-1965 Today's Date: 07/25/2021  History of Present Illness  Alice [name] is a 56 y.o. female with medical history significant of anemia, cocaine abuse, COPD, hyperlipidemia, hypertension, stroke the ED with a chief complaint of fall.  Patient reports that she has been extremely dizzy which is resulted in a fall.  She has 5 steps to get in her home, she was 3 steps up when she started to get really dizzy, so she tried to make the last 2 steps before sitting down.  She fell on her legs resulting in a right laceration in the middle of her tibia region, and she fell off the steps and landed on the ground as there is no handrail to stop her.  Patient reports that she did not hit her head, she did not lose consciousness, the dizziness was unchanged.  She has never had dizziness like this before.  She reports that the room is spinning around her.  She does not have associated chest pain, palpitations, dyspnea, syncope.  She had no recent illness, no ear pain, no ringing in her ears.  Her mom did have vertigo.  Patient reports that meclizine did not touch her symptoms.  Ativan made her more agitated.  Valium has given her double vision per her report.  Patient denies any nausea but reports that she was so hungry, but when she saw food she could not eat because she was too dizzy.  Patient reports she is otherwise been in her normal state of health.    Clinical Impression  Patient lying in bed on therapist arrival.  She is agreeable to therapy.  Patient performs supine to sit with modified independence; take extra time.  She has fair to good sitting balance on the edge of the bed; reports she is dizzy at all times. Of note, she has some trouble with her oculomotor tracking.  Sit to stand to RW with min A for balance and safety and she is able to walk in the room x 10 ft with RW and occassional min A to navigate the walker.  She  demonstrates forward flexed trunk with sitting, standing and walking. Patient will benefit from continued skilled therapy interventions during the remainder of her hospital stay and at the next recommended venue of care to address deficits and promote optimal function.        Recommendations for follow up therapy are one component of a multi-disciplinary discharge planning process, led by the attending physician.  Recommendations may be updated based on patient status, additional functional criteria and insurance authorization.  Follow Up Recommendations Home health PT      Assistance Recommended at Discharge Intermittent Supervision/Assistance  Patient can return home with the following  A little help with walking and/or transfers;A little help with bathing/dressing/bathroom;Help with stairs or ramp for entrance    Equipment Recommendations None recommended by PT  Recommendations for Other Services       Functional Status Assessment Patient has had a recent decline in their functional status and demonstrates the ability to make significant improvements in function in a reasonable and predictable amount of time.     Precautions / Restrictions Precautions Precautions: Fall Restrictions Weight Bearing Restrictions: No      Mobility  Bed Mobility Overal bed mobility: Modified Independent             General bed mobility comments: takes extra time to come to EOB Patient Response: Cooperative  Transfers  Overall transfer level: Needs assistance Equipment used: Rolling walker (2 wheels) Transfers: Sit to/from Stand Sit to Stand: Min guard, Min assist           General transfer comment: min A for balance with sit to stand to RW    Ambulation/Gait Ambulation/Gait assistance: Min guard, Min assist Gait Distance (Feet): 15 Feet Assistive device: Rolling walker (2 wheels) Gait Pattern/deviations: Trunk flexed Gait velocity: decreased gait speed     General Gait  Details: some difficulty with turning walker; needs min A to turn  Stairs            Wheelchair Mobility    Modified Rankin (Stroke Patients Only)       Balance Overall balance assessment: Needs assistance Sitting-balance support: Feet supported Sitting balance-Leahy Scale: Fair Sitting balance - Comments: fair to good sitting balance on EOB; min guard from therapist; forward flexed trunk   Standing balance support: During functional activity, Reliant on assistive device for balance, Bilateral upper extremity supported Standing balance-Leahy Scale: Fair Standing balance comment: min A to maintain standing balance with RW                             Pertinent Vitals/Pain Pain Assessment Pain Assessment: 0-10 Pain Score: 7  Pain Location: Right shin Pain Intervention(s): Monitored during session    Home Living Family/patient expects to be discharged to:: Private residence Living Arrangements: Children Available Help at Discharge: Family;Available PRN/intermittently Type of Home: House Home Access: Stairs to enter Entrance Stairs-Rails: None Entrance Stairs-Number of Steps: 3-5   Home Layout: One level Home Equipment: Shower seat;Grab bars - tub/shower;Grab bars - toilet;Hand held shower head;Rollator (4 wheels);Gilmer Mor - single point      Prior Function Prior Level of Function : Needs assist             Mobility Comments: household distances with Rollator ADLs Comments: assist for IADLs, bathing and dressing; 3 hours/day M thru Friday and 2 hours Sat and Sun     Hand Dominance   Dominant Hand: Right    Extremity/Trunk Assessment   Upper Extremity Assessment Upper Extremity Assessment: Generalized weakness    Lower Extremity Assessment Lower Extremity Assessment: Generalized weakness    Cervical / Trunk Assessment Cervical / Trunk Assessment: Kyphotic  Communication   Communication: No difficulties  Cognition Arousal/Alertness:  Awake/alert Behavior During Therapy: WFL for tasks assessed/performed Overall Cognitive Status: Within Functional Limits for tasks assessed                                          General Comments General comments (skin integrity, edema, etc.): sutures right shin    Exercises     Assessment/Plan    PT Assessment Patient needs continued PT services  PT Problem List Decreased strength;Decreased activity tolerance;Decreased safety awareness;Decreased balance;Decreased mobility;Pain       PT Treatment Interventions Gait training;Balance training;Neuromuscular re-education;Functional mobility training;Patient/family education;Therapeutic activities;Therapeutic exercise    PT Goals (Current goals can be found in the Care Plan section)  Acute Rehab PT Goals Patient Stated Goal: return home PT Goal Formulation: With patient Time For Goal Achievement: 08/08/21 Potential to Achieve Goals: Good    Frequency Min 2X/week     Co-evaluation               AM-PAC PT "6 Clicks" Mobility  Outcome Measure  Help needed turning from your back to your side while in a flat bed without using bedrails?: None Help needed moving from lying on your back to sitting on the side of a flat bed without using bedrails?: A Little Help needed moving to and from a bed to a chair (including a wheelchair)?: A Little Help needed standing up from a chair using your arms (e.g., wheelchair or bedside chair)?: A Little Help needed to walk in hospital room?: A Little Help needed climbing 3-5 steps with a railing? : A Lot 6 Click Score: 18    End of Session   Activity Tolerance: Patient tolerated treatment well Patient left: in bed;with call bell/phone within reach Nurse Communication: Mobility status PT Visit Diagnosis: Unsteadiness on feet (R26.81);Other abnormalities of gait and mobility (R26.89);Muscle weakness (generalized) (M62.81);History of falling (Z91.81);Dizziness and giddiness  (R42)    Time: 4098-1191 PT Time Calculation (min) (ACUTE ONLY): 19 min   Charges:   PT Evaluation $PT Eval Low Complexity: 1 Low PT Treatments $Therapeutic Activity: 8-22 mins        9:32 AM, 07/25/21 Talasia Saulter Small Senita Corredor MPT [facility] [place] [phone] Ph:[phone]

## 2021-07-25 NOTE — Progress Notes (Signed)
2D echocardiogram completed.  07/25/2021 1:56 PM Eula Fried., MHA, RVT, RDCS, RDMS

## 2021-07-25 NOTE — Discharge Instructions (Signed)
1)Right leg wound care as advised--keep wound clean and dry 2) follow-up with neurologist Dr. Gerilyn Pilgrim as previously advised 3)Avoid ibuprofen/Advil/Aleve/Motrin/Goody Powders/Naproxen/BC powders/Meloxicam/Diclofenac/Indomethacin and other Nonsteroidal anti-inflammatory medications as these will make you more likely to bleed and can cause stomach ulcers, can also cause Kidney problems.

## 2021-07-26 DIAGNOSIS — I63339 Cerebral infarction due to thrombosis of unspecified posterior cerebral artery: Secondary | ICD-10-CM | POA: Diagnosis not present

## 2021-07-27 ENCOUNTER — Ambulatory Visit: Payer: Medicaid Other | Attending: Family Medicine | Admitting: Physical Therapy

## 2021-07-27 ENCOUNTER — Telehealth: Payer: Self-pay

## 2021-07-27 ENCOUNTER — Encounter: Payer: Self-pay | Admitting: Physical Therapy

## 2021-07-27 DIAGNOSIS — R262 Difficulty in walking, not elsewhere classified: Secondary | ICD-10-CM | POA: Insufficient documentation

## 2021-07-27 DIAGNOSIS — I63339 Cerebral infarction due to thrombosis of unspecified posterior cerebral artery: Secondary | ICD-10-CM | POA: Diagnosis not present

## 2021-07-27 DIAGNOSIS — M6281 Muscle weakness (generalized): Secondary | ICD-10-CM | POA: Insufficient documentation
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
## 2021-07-27 NOTE — Patient Outreach (Signed)
Care Coordination  07/27/2021  Shadie Sweatman 03/07/1965 [id number]  Transition Care Management Unsuccessful Follow-up Telephone Call  Date of discharge and from where:  07/25/21 [facility]   Attempts:  1st Attempt  Reason for unsuccessful TCM follow-up call:  Left voice message

## 2021-07-28 ENCOUNTER — Telehealth: Payer: Self-pay | Admitting: Nurse Practitioner

## 2021-07-28 ENCOUNTER — Telehealth: Payer: Self-pay

## 2021-07-28 DIAGNOSIS — J449 Chronic obstructive pulmonary disease, unspecified: Secondary | ICD-10-CM | POA: Diagnosis not present

## 2021-07-28 DIAGNOSIS — I639 Cerebral infarction, unspecified: Secondary | ICD-10-CM | POA: Diagnosis not present

## 2021-07-28 DIAGNOSIS — R32 Unspecified urinary incontinence: Secondary | ICD-10-CM | POA: Diagnosis not present

## 2021-07-28 DIAGNOSIS — I63339 Cerebral infarction due to thrombosis of unspecified posterior cerebral artery: Secondary | ICD-10-CM | POA: Diagnosis not present

## 2021-07-28 NOTE — Patient Instructions (Signed)
Visit Information  Alice [name] was given information about Medicaid Managed Care team care coordination services as a part of their [facility] Medicaid benefit. Alice [name] verbally consented to engagement with the [facility] Managed Care team.   If you are experiencing a medical emergency, please call 911 or report to your local emergency department or urgent care.   If you have a non-emergency medical problem during routine business hours, please contact your provider's office and ask to speak with a nurse.   For questions related to your [facility], please call: [phone] or visit the homepage here: [email or web address]  If you would like to schedule transportation through your [facility], please call the following number at least 2 days in advance of your appointment: [phone]   Rides for urgent appointments can also be made after hours by calling Member Services.  Call the Behavioral Health Crisis Line at [phone], at any time, 24 hours a day, 7 days a week. If you are in danger or need immediate medical attention call 911.  If you would like help to quit smoking, call 1-800-QUIT-NOW (289-723-1341) OR Espaol: 1-855-Djelo-Ya (7-062-376-2831) o para ms informacin haga clic aqu or Text READY to 517-616 to register via text  [name] - following are the goals we discussed in your visit today:   Goals Addressed   None       Social Worker will follow up in 14 days .   Gus Puma, BSW, [facility]  [place]  High Risk Managed Medicaid Team  [phone]   Following is a copy of your plan of care:  There are no care plans that you recently modified to display for this patient.

## 2021-07-28 NOTE — Patient Outreach (Signed)
Care Coordination  07/28/2021  Casi Westerfeld 1965/06/06 [id number]  Transition Care Management Follow-up Telephone Call Date of discharge and from where: 07/25/21 [facility]  How have you been since you were released from the hospital? Okay, leg is still hurting and thinks it is infected. Any questions or concerns? No  Items Reviewed: Did the pt receive and understand the discharge instructions provided? Yes  Medications obtained and verified? No  Other? No  Any new allergies since your discharge? No  Dietary orders reviewed? No Do you have support at home? Yes   Home Care and Equipment/Supplies: Were home health services ordered? no If so, what is the name of the agency?  Has the agency set up a time to come to the patient's home? not applicable Were any new equipment or medical supplies ordered?  No What is the name of the medical supply agency?  Were you able to get the supplies/equipment? no Do you have any questions related to the use of the equipment or supplies? No  Functional Questionnaire: (I = Independent and D = Dependent) ADLs: I   Bathing/Dressing- I  Meal Prep- I  Eating- I  Maintaining continence- I  Transferring/Ambulation- I  Managing Meds- I  Follow up appointments reviewed:  PCP Hospital f/u appt confirmed? Yes  Scheduled to see Onyeje on 07/31/21 @ . Specialist Hospital f/u appt confirmed? No  Scheduled to see  Are transportation arrangements needed? No  If their condition worsens, is the pt aware to call PCP or go to the Emergency Dept.? Yes Was the patient provided with contact information for the PCP's office or ED? Yes Was to pt encouraged to call back with questions or concerns? Yes

## 2021-07-28 NOTE — Patient Outreach (Signed)
Care Coordination  07/28/2021  Alice [name] Feb 05, 1965 [id number]  BSW completed TOC outreach with patient and offered MM services. Patient did consent to Social work services only. Patient states she does receive home health 7 days a week for 2 hours on the weekend and 3 hours during the week and would like more hours. BSW will send patients PCP a message for a script to be sent to [facility] for additional hours, and follow up with patient in 14 days.   Gus Puma, BSW, [facility]    High Risk Managed Medicaid Team  [phone]

## 2021-07-28 NOTE — Telephone Encounter (Signed)
Attempted to contact- NA  Patient needs appointment to have leg evaluated.

## 2021-07-29 DIAGNOSIS — I63339 Cerebral infarction due to thrombosis of unspecified posterior cerebral artery: Secondary | ICD-10-CM | POA: Diagnosis not present

## 2021-07-30 DIAGNOSIS — I63339 Cerebral infarction due to thrombosis of unspecified posterior cerebral artery: Secondary | ICD-10-CM | POA: Diagnosis not present

## 2021-07-31 ENCOUNTER — Encounter: Payer: Self-pay | Admitting: Family Medicine

## 2021-07-31 ENCOUNTER — Encounter: Payer: Self-pay | Admitting: Physical Therapy

## 2021-07-31 ENCOUNTER — Ambulatory Visit: Payer: Medicaid Other | Admitting: Physical Therapy

## 2021-07-31 ENCOUNTER — Ambulatory Visit: Payer: Medicaid Other | Admitting: Family Medicine

## 2021-07-31 VITALS — BP 114/75 | HR 76 | Temp 98.7°F | Ht 72.0 in | Wt 194.0 lb

## 2021-07-31 DIAGNOSIS — L03115 Cellulitis of right lower limb: Secondary | ICD-10-CM

## 2021-07-31 DIAGNOSIS — Z4802 Encounter for removal of sutures: Secondary | ICD-10-CM

## 2021-07-31 DIAGNOSIS — M6281 Muscle weakness (generalized): Secondary | ICD-10-CM

## 2021-07-31 DIAGNOSIS — R262 Difficulty in walking, not elsewhere classified: Secondary | ICD-10-CM | POA: Diagnosis not present

## 2021-07-31 DIAGNOSIS — I63339 Cerebral infarction due to thrombosis of unspecified posterior cerebral artery: Secondary | ICD-10-CM | POA: Diagnosis not present

## 2021-07-31 MED ORDER — DOXYCYCLINE HYCLATE 100 MG PO TABS: 100.0000 mg | ORAL_TABLET | Freq: Two times a day (BID) | ORAL | 0 refills | Status: AC

## 2021-07-31 NOTE — Therapy (Signed)
OUTPATIENT PHYSICAL THERAPY TREATMENT NOTE   Patient Name: Alice [name] MRN: [id number] DOB:10-Jan-1966, 56 y.o., female Today's Date: 07/31/2021   REFERRING PROVIDER: Mariea Clonts, MD   PT End of Session - 07/31/21 1118     Visit Number 4    Number of Visits 12    Date for PT Re-Evaluation 09/04/21    PT Start Time 1118    PT Stop Time 1202    PT Time Calculation (min) 44 min    Equipment Utilized During Treatment Other (comment)   rollator   Activity Tolerance Patient tolerated treatment well    Behavior During Therapy [facility] for tasks assessed/performed             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   BACK SURGERY     hardware in back   cyst removed from ovary     FRACTURE SURGERY     LUMBAR SPINE   removal of BB in finger  2008   SALIVARY GLAND SURGERY     tumor reomoved from stomach     Patient Active Problem List   Diagnosis Date Noted   Vertigo 07/24/2021   Fall at home, initial encounter 07/24/2021   GERD (gastroesophageal reflux disease) 07/24/2021   TIA (transient ischemic attack) 05/31/2021   Full incontinence of feces 02/13/2021   Urinary incontinence 02/13/2021   Hospital discharge follow-up 12/12/2020   Depression, major, single episode, moderate (HCC) 10/22/2020   Chronic pain of right knee 11/01/2018   GAD (generalized anxiety disorder) 11/01/2018   Vitamin D deficiency 11/01/2018   Acute bacterial endocarditis 07/27/2018   Ataxia 09/08/2016   Dysarthria 09/08/2016   H/o Prior ischemic stroke (HCC) 09/03/2016   Chronic hepatitis C virus infection (HCC) 05/09/2016   History of stroke 05/09/2016   History of  substance abuse (HCC) 05/09/2016   Stroke-like symptoms 05/09/2016   Sinusitis 05/09/2016   Heart murmur 01/08/2016   IVDU (intravenous drug user) 01/08/2016   COPD (chronic obstructive pulmonary disease) (HCC) 10/31/2015   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   AKI (acute kidney injury) (HCC) 10/04/2015   Dyslipidemia 10/04/2015   HTN (hypertension) 10/04/2015   Discitis 10/04/2015   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/08/2015   Muscular deconditioning 05/24/2013   Postlaminectomy syndrome, lumbar region 12/04/2012   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   Adverse drug reaction 06/17/2012   Anemia 06/17/2012   Chronic low back pain 06/17/2012   DDD (degenerative disc disease), lumbosacral 06/17/2012   Depressive disorder 06/17/2012   Insomnia 06/17/2012   Pain in soft tissues of limb 06/17/2012   Myalgia and myositis 06/17/2012   Thoracic or lumbosacral neuritis or radiculitis 06/17/2012   Sacroiliitis (HCC) 06/17/2012   Opioid use disorder, severe, dependence (HCC) 07/30/2011    Class: Acute   Precordial pain 02/18/2010   TOBACCO ABUSE 12/05/2009   Anxiety state 12/05/2009   Cigarette nicotine dependence without complication 12/05/2009   Palpitations 12/05/2009    REFERRING DIAG: Stroke like symptoms  THERAPY DIAG:  Difficulty in walking, not elsewhere classified  Muscle weakness (generalized)  Rationale for Evaluation and Treatment Rehabilitation  PERTINENT HISTORY: multiple CVA's, multiple lumbar surgery, chronic low back pain  PRECAUTIONS: Fall  SUBJECTIVE: Seeing PCP after today's PT as her leg wound is very red, painful and swollen. Not as active as she normally is so she feels like her back is tighter. But trying to sit up more erect when going to the bathroom.  PAIN:  Are you having pain? Yes: NPRS scale: no rating provided/10 Pain location: lumbar Pain description: constant throbbing Aggravating factors:  movement Relieving factors: heating pad   TODAY'S TREATMENT:                                     EXERCISE LOG  Exercise Repetitions and Resistance Comments  Ball press  X20 reps 5 sec holds   Shoulder flexion/elevation with ball X20 reps   Horizontal abduction Red x20 reps   Shoulder Er Red x20 reps   X to V  X10 reps   D2  Red x20 reps   UBE 90 RPM x8 min            Blank cell = exercise not performed today    PATIENT EDUCATION:   HOME EXERCISE:    PT Long Term Goals - 06/16/21 0802       PT LONG TERM GOAL #1   Title Patient will be independent with her HEP.    Time 6    Period Weeks    Status New    Target Date 07/27/21      PT LONG TERM GOAL #2   Title Patient will be able to improve her five time sit to stand to 13 seconds or less with upper extremity support for improved safety with transfers.    Time 6    Period Weeks    Status New    Target Date 07/27/21      PT LONG TERM GOAL #3   Title Patient will improve her gait speed to at least 0.6 m/s for improved household mobility.    Time 6    Period Weeks    Status New    Target Date 07/27/21              Plan - 07/31/21 1119     Clinical Impression Statement Patient presented in clinic with reports of MD visit to monitor her wound from a fall last weekend. Redness noted surrounding the cut and inferior to the wound. Swelling also noted in anterior calf and into foot. Patient required rest breaks due to fatigue.   Personal Factors and Comorbidities Other;Time since onset of injury/illness/exacerbation;Transportation;Comorbidity 3+    Comorbidities HTN, history of multiple CVA's, COPD, hepititus C,    Examination-Activity Limitations Locomotion Level;Transfers;Stairs;Stand    Examination-Participation Restrictions Cleaning;Community Activity    Stability/Clinical Decision Making Evolving/Moderate complexity    Rehab Potential Fair    PT Frequency 2x / week    PT Duration 6 weeks    PT  Treatment/Interventions ADLs/Self Care Home Management;Electrical Stimulation;Gait training;Stair training;Functional mobility training;Therapeutic activities;Therapeutic exercise;Balance training;Neuromuscular re-education;Patient/family education    PT Next Visit Plan nustep, upper and lower extremity strengthening, gait training, postural stability    Consulted and Agree with Plan of Care Patient             Marvell Fuller, PTA 07/31/2021, 12:18 PM

## 2021-07-31 NOTE — Progress Notes (Signed)
Subjective:  Patient ID: Alice [name], female    DOB: 1965/01/29, 56 y.o.   MRN: [id number]  Patient Care Team: Ivy Lynn, NP as PCP - General (Nurse Practitioner) Ethelda Chick as Social Worker   Chief Complaint:  Wound Check   HPI: Alice [name] is a 56 y.o. female presenting on 07/31/2021 for Wound Check   Wound Check Treated in ED: 07/24/2021. Previous treatment included laceration repair. There has been colored discharge from the wound. The redness has worsened. The swelling has worsened. The pain has worsened. She has no difficulty moving the affected extremity or digit.  Pt reports she has been cleaning the wound with alcohol and applying antibiotic ointment daily.   Relevant past medical, surgical, family, and social history reviewed and updated as indicated.  Allergies and medications reviewed and updated. Data reviewed: Chart in Epic.   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   Qualifier: Diagnosis of  By: Delfino Lovett     Chronic low back pain    Cocaine abuse (HCC)    COPD (chronic obstructive pulmonary disease) (HCC)    Heart murmur    HLD (hyperlipidemia)    Hypertension    Stroke ([place])    x4    Past Surgical History:  Procedure Laterality Date   ABDOMINAL HYSTERECTOMY     abd?   BACK SURGERY     hardware in back   cyst removed from ovary     FRACTURE SURGERY     LUMBAR SPINE   removal of BB in finger  2008   SALIVARY GLAND SURGERY     tumor reomoved from stomach      Social History   Socioeconomic History   Marital status: Widowed    Spouse name: Not on file   Number of children: Not on file   Years of education: Not on file   Highest education level: Not on file  Occupational History   Occupation: disabled  Tobacco Use   Smoking status: Every Day    Packs/day: 0.50    Years: 40.00    Total pack years: 20.00    Types: Cigarettes   Smokeless tobacco: Never  Vaping Use   Vaping Use: Never used   Substance and Sexual Activity   Alcohol use: Not Currently    Comment: today   Drug use: Not Currently    Types: Heroin, "Crack" cocaine, Oxycodone, Cocaine    Comment: last used 05/24/20   Sexual activity: Not Currently  Other Topics Concern   Not on file  Social History Narrative   Single, 2 children; disabled.    Social Determinants of Health   Financial Resource Strain: Low Risk  (07/28/2021)   Overall Financial Resource Strain (CARDIA)    Difficulty of Paying Living Expenses: Not hard at all  Food Insecurity: No Food Insecurity (07/28/2021)   Hunger Vital Sign    Worried About Running Out of Food in the Last Year: Never true    Ran Out of Food in the Last Year: Never true  Transportation Needs: No Transportation Needs (07/28/2021)   PRAPARE - Hydrologist (Medical): No    Lack of Transportation (Non-Medical): No  Physical Activity: Not on file  Stress: Not on file  Social Connections: Not on file  Intimate Partner Violence: Not on file    Outpatient Encounter Medications as of 07/31/2021  Medication Sig   albuterol (VENTOLIN HFA) 108 (90 Base) MCG/ACT inhaler  Inhale 2 puffs into the lungs every 6 (six) hours as needed for wheezing or shortness of breath.   aspirin EC 81 MG tablet Take 1 tablet (81 mg total) by mouth daily with breakfast. For Stroke Prevention   baclofen (LIORESAL) 10 MG tablet Take 1 tablet (10 mg total) by mouth 2 (two) times daily.   doxycycline (VIBRA-TABS) 100 MG tablet Take 1 tablet (100 mg total) by mouth 2 (two) times daily for 10 days. 1 po bid   gabapentin (NEURONTIN) 100 MG capsule Take 100 mg by mouth 2 (two) times daily.   lisinopril (ZESTRIL) 10 MG tablet Take 1 tablet (10 mg total) by mouth daily.   nicotine (NICODERM CQ - DOSED IN MG/24 HOURS) 14 mg/24hr patch Place 1 patch (14 mg total) onto the skin daily.   pantoprazole (PROTONIX) 40 MG tablet Take 1 tablet (40 mg total) by mouth daily.   SUBOXONE 8-2 MG FILM  Place 0.5 Film under the tongue every 6 (six) hours.   umeclidinium-vilanterol (ANORO ELLIPTA) 62.5-25 MCG/ACT AEPB Inhale 1 puff into the lungs daily. For COPD   No facility-administered encounter medications on file as of 07/31/2021.    Allergies  Allergen Reactions   Benadryl [Diphenhydramine Hcl] Hives   Codeine Rash   Tetracycline Itching and Rash    Review of Systems  Constitutional:  Negative for activity change, appetite change, chills, diaphoresis, fatigue, fever and unexpected weight change.  Respiratory:  Negative for cough and shortness of breath.   Cardiovascular:  Negative for chest pain, palpitations and leg swelling.  Genitourinary:  Negative for decreased urine volume and difficulty urinating.  Musculoskeletal:  Positive for arthralgias, back pain and gait problem.  Skin:  Positive for color change and wound. Negative for pallor and rash.  Neurological:  Negative for weakness.  Psychiatric/Behavioral:  Negative for confusion.   All other systems reviewed and are negative.       Objective:  BP 114/75   Pulse 76   Temp 98.7 F (37.1 C)   Ht 6' (1.829 m)   Wt 194 lb (88 kg)   LMP 07/30/2011   SpO2 95%   BMI 26.31 kg/m    Wt Readings from Last 3 Encounters:  07/31/21 194 lb (88 kg)  07/24/21 198 lb (89.8 kg)  07/18/21 198 lb 6.6 oz (90 kg)    Physical Exam Vitals and nursing note reviewed.  Constitutional:      Appearance: She is overweight. She is ill-appearing (chronically ill).  HENT:     Head: Normocephalic and atraumatic.     Mouth/Throat:     Mouth: Mucous membranes are moist.  Eyes:     Pupils: Pupils are equal, round, and reactive to light.  Cardiovascular:     Rate and Rhythm: Normal rate and regular rhythm.     Heart sounds: Normal heart sounds.  Pulmonary:     Effort: Pulmonary effort is normal.     Breath sounds: Normal breath sounds.  Skin:    General: Skin is warm and dry.     Capillary Refill: Capillary refill takes less than 2  seconds.       Neurological:     General: No focal deficit present.     Mental Status: She is alert and oriented to person, place, and time.     Gait: Gait abnormal (slow, using rolling walker).  Psychiatric:        Mood and Affect: Mood normal.        Behavior: Behavior normal.  Thought Content: Thought content normal.        Judgment: Judgment normal.      Results for orders placed or performed during the hospital encounter of 07/24/21  Resp Panel by RT-PCR (Flu A&B, Covid) Anterior Nasal Swab   Specimen: Anterior Nasal Swab  Result Value Ref Range   SARS Coronavirus 2 by RT PCR NEGATIVE NEGATIVE   Influenza A by PCR NEGATIVE NEGATIVE   Influenza B by PCR NEGATIVE NEGATIVE  Protime-INR  Result Value Ref Range   Prothrombin Time 13.1 11.4 - 15.2 seconds   INR 1.0 0.8 - 1.2  APTT  Result Value Ref Range   aPTT 27 24 - 36 seconds  Comprehensive metabolic panel  Result Value Ref Range   Sodium 138 135 - 145 mmol/L   Potassium 4.4 3.5 - 5.1 mmol/L   Chloride 103 98 - 111 mmol/L   CO2 28 22 - 32 mmol/L   Glucose, Bld 101 (H) 70 - 99 mg/dL   BUN 9 6 - 20 mg/dL   Creatinine, Ser 9.51 0.44 - 1.00 mg/dL   Calcium 9.1 8.9 - 88.4 mg/dL   Total Protein 7.9 6.5 - 8.1 g/dL   Albumin 4.2 3.5 - 5.0 g/dL   AST 18 15 - 41 U/L   ALT 8 0 - 44 U/L   Alkaline Phosphatase 89 38 - 126 U/L   Total Bilirubin 1.0 0.3 - 1.2 mg/dL   GFR, Estimated >16 >60 mL/min   Anion gap 7 5 - 15  Urine rapid drug screen (hosp performed)  Result Value Ref Range   Opiates NONE DETECTED NONE DETECTED   Cocaine NONE DETECTED NONE DETECTED   Benzodiazepines NONE DETECTED NONE DETECTED   Amphetamines NONE DETECTED NONE DETECTED   Tetrahydrocannabinol NONE DETECTED NONE DETECTED   Barbiturates NONE DETECTED NONE DETECTED  Urinalysis, Routine w reflex microscopic Urine, Clean Catch  Result Value Ref Range   Color, Urine STRAW (A) YELLOW   APPearance CLEAR CLEAR   Specific Gravity, Urine 1.006 1.005  - 1.030   pH 8.0 5.0 - 8.0   Glucose, UA NEGATIVE NEGATIVE mg/dL   Hgb urine dipstick NEGATIVE NEGATIVE   Bilirubin Urine NEGATIVE NEGATIVE   Ketones, ur NEGATIVE NEGATIVE mg/dL   Protein, ur NEGATIVE NEGATIVE mg/dL   Nitrite NEGATIVE NEGATIVE   Leukocytes,Ua NEGATIVE NEGATIVE  CBC WITH DIFFERENTIAL  Result Value Ref Range   WBC 6.7 4.0 - 10.5 K/uL   RBC 4.09 3.87 - 5.11 MIL/uL   Hemoglobin 12.8 12.0 - 15.0 g/dL   HCT 63.0 16.0 - 10.9 %   MCV 91.9 80.0 - 100.0 fL   MCH 31.3 26.0 - 34.0 pg   MCHC 34.0 30.0 - 36.0 g/dL   RDW 32.3 55.7 - 32.2 %   Platelets 222 150 - 400 K/uL   nRBC 0.0 0.0 - 0.2 %   Neutrophils Relative % 74 %   Neutro Abs 5.0 1.7 - 7.7 K/uL   Lymphocytes Relative 17 %   Lymphs Abs 1.2 0.7 - 4.0 K/uL   Monocytes Relative 5 %   Monocytes Absolute 0.4 0.1 - 1.0 K/uL   Eosinophils Relative 3 %   Eosinophils Absolute 0.2 0.0 - 0.5 K/uL   Basophils Relative 1 %   Basophils Absolute 0.1 0.0 - 0.1 K/uL   Immature Granulocytes 0 %   Abs Immature Granulocytes 0.02 0.00 - 0.07 K/uL  Ethanol  Result Value Ref Range   Alcohol, Ethyl (B) <10 <10 mg/dL  Lipid panel  Result Value Ref Range   Cholesterol 192 0 - 200 mg/dL   Triglycerides 93 <150 mg/dL   HDL 43 >40 mg/dL   Total CHOL/HDL Ratio 4.5 RATIO   VLDL 19 0 - 40 mg/dL   LDL Cholesterol 130 (H) 0 - 99 mg/dL  CBC  Result Value Ref Range   WBC 6.3 4.0 - 10.5 K/uL   RBC 3.98 3.87 - 5.11 MIL/uL   Hemoglobin 12.3 12.0 - 15.0 g/dL   HCT 36.4 36.0 - 46.0 %   MCV 91.5 80.0 - 100.0 fL   MCH 30.9 26.0 - 34.0 pg   MCHC 33.8 30.0 - 36.0 g/dL   RDW 12.3 11.5 - 15.5 %   Platelets 202 150 - 400 K/uL   nRBC 0.0 0.0 - 0.2 %  Comprehensive metabolic panel  Result Value Ref Range   Sodium 142 135 - 145 mmol/L   Potassium 3.7 3.5 - 5.1 mmol/L   Chloride 108 98 - 111 mmol/L   CO2 28 22 - 32 mmol/L   Glucose, Bld 106 (H) 70 - 99 mg/dL   BUN 8 6 - 20 mg/dL   Creatinine, Ser 0.64 0.44 - 1.00 mg/dL   Calcium 8.9 8.9 - 10.3  mg/dL   Total Protein 7.0 6.5 - 8.1 g/dL   Albumin 3.6 3.5 - 5.0 g/dL   AST 12 (L) 15 - 41 U/L   ALT 8 0 - 44 U/L   Alkaline Phosphatase 75 38 - 126 U/L   Total Bilirubin 0.5 0.3 - 1.2 mg/dL   GFR, Estimated >60 >60 mL/min   Anion gap 6 5 - 15  Blood Alcohol Level  Result Value Ref Range   Alcohol, Ethyl (B) <10 <10 mg/dL  I-stat chem 8, ED  Result Value Ref Range   Sodium 142 135 - 145 mmol/L   Potassium 3.9 3.5 - 5.1 mmol/L   Chloride 103 98 - 111 mmol/L   BUN 7 6 - 20 mg/dL   Creatinine, Ser 0.60 0.44 - 1.00 mg/dL   Glucose, Bld 95 70 - 99 mg/dL   Calcium, [name] 1.12 (L) 1.15 - 1.40 mmol/L   TCO2 27 22 - 32 mmol/L   Hemoglobin 12.9 12.0 - 15.0 g/dL   HCT 38.0 36.0 - 46.0 %  POC urine preg, ED  Result Value Ref Range   Preg Test, Ur NEGATIVE NEGATIVE  CBG monitoring, ED  Result Value Ref Range   Glucose-Capillary 92 70 - 99 mg/dL  ECHOCARDIOGRAM COMPLETE  Result Value Ref Range   Weight 3,168 oz   Height 72 in   BP 140/96 mmHg   AR max vel 2.81 cm2   AV Area VTI 2.87 cm2   AV Mean grad 3.0 mmHg   AV Peak grad 6.3 mmHg   Ao pk vel 1.25 m/s   AV Area mean vel 2.68 cm2   MV VTI 0.43 cm2   Area-P 1/2 3.93 cm2   S' Lateral 3.10 cm   MV M vel 2.10 m/s   MV Peak grad 17.6 mmHg     Suture Removal  Date/Time: 07/31/2021 1:43 PM  Performed by: Baruch Gouty, FNP Authorized by: Baruch Gouty, FNP  Body area: lower extremity Location details: right lower leg Wound Appearance: red, tender and purulent Sutures Removed: 2 Post-removal: dressing applied Patient tolerance: patient tolerated the procedure well with no immediate complications      Pertinent labs & imaging results that were available during my care of the patient were  reviewed by me and considered in my medical decision making.  Assessment & Plan:  Alice [name] was seen today for wound check.  Diagnoses and all orders for this visit:  Cellulitis of right lower extremity After recent laceration repair.  Will place on doxycycline as prescribed. Report new, worsening, or persistent symptoms. Follow up in 2 weeks for reevaluation, sooner if warranted.  -     doxycycline (VIBRA-TABS) 100 MG tablet; Take 1 tablet (100 mg total) by mouth 2 (two) times daily for 10 days. 1 po bid  Encounter for removal of sutures 2 sutures removed. Wound care discussed in detail. Pt aware to wash with soap and water twice daily, no alcohol or antibiotic ointment.  -     Suture Removal     Continue all other maintenance medications.  Follow up plan: Return in about 2 weeks (around 08/14/2021), or if symptoms worsen or fail to improve.   Continue healthy lifestyle choices, including diet (rich in fruits, vegetables, and lean proteins, and low in salt and simple carbohydrates) and exercise (at least 30 minutes of moderate physical activity daily).  Educational handout given for wound care  The above assessment and management plan was discussed with the patient. The patient verbalized understanding of and has agreed to the management plan. Patient is aware to call the clinic if they develop any new symptoms or if symptoms persist or worsen. Patient is aware when to return to the clinic for a follow-up visit. Patient educated on when it is appropriate to go to the emergency department.   Monia Pouch, FNP-C [facility] [phone]

## 2021-08-01 DIAGNOSIS — I63339 Cerebral infarction due to thrombosis of unspecified posterior cerebral artery: Secondary | ICD-10-CM | POA: Diagnosis not present

## 2021-08-02 DIAGNOSIS — I63339 Cerebral infarction due to thrombosis of unspecified posterior cerebral artery: Secondary | ICD-10-CM | POA: Diagnosis not present

## 2021-08-03 DIAGNOSIS — I63339 Cerebral infarction due to thrombosis of unspecified posterior cerebral artery: Secondary | ICD-10-CM | POA: Diagnosis not present

## 2021-08-03 NOTE — Telephone Encounter (Signed)
Patient was seen on 07/31/21 for follow up, has 2 week recheck on 08/14/21.  Says leg is doing better.

## 2021-08-04 DIAGNOSIS — I63339 Cerebral infarction due to thrombosis of unspecified posterior cerebral artery: Secondary | ICD-10-CM | POA: Diagnosis not present

## 2021-08-05 DIAGNOSIS — I63339 Cerebral infarction due to thrombosis of unspecified posterior cerebral artery: Secondary | ICD-10-CM | POA: Diagnosis not present

## 2021-08-06 DIAGNOSIS — I63339 Cerebral infarction due to thrombosis of unspecified posterior cerebral artery: Secondary | ICD-10-CM | POA: Diagnosis not present

## 2021-08-07 DIAGNOSIS — I63339 Cerebral infarction due to thrombosis of unspecified posterior cerebral artery: Secondary | ICD-10-CM | POA: Diagnosis not present

## 2021-08-08 DIAGNOSIS — I63339 Cerebral infarction due to thrombosis of unspecified posterior cerebral artery: Secondary | ICD-10-CM | POA: Diagnosis not present

## 2021-08-09 DIAGNOSIS — I63339 Cerebral infarction due to thrombosis of unspecified posterior cerebral artery: Secondary | ICD-10-CM | POA: Diagnosis not present

## 2021-08-10 ENCOUNTER — Ambulatory Visit: Payer: Medicaid Other

## 2021-08-10 DIAGNOSIS — M6281 Muscle weakness (generalized): Secondary | ICD-10-CM

## 2021-08-10 DIAGNOSIS — R262 Difficulty in walking, not elsewhere classified: Secondary | ICD-10-CM | POA: Diagnosis not present

## 2021-08-10 DIAGNOSIS — I63339 Cerebral infarction due to thrombosis of unspecified posterior cerebral artery: Secondary | ICD-10-CM | POA: Diagnosis not present

## 2021-08-10 NOTE — Therapy (Signed)
OUTPATIENT PHYSICAL THERAPY TREATMENT NOTE   Patient Name: Alice [name] MRN: [id number] DOB:[birth year], 56 y.o., female Today's Date: 08/10/2021   REFERRING PROVIDER: Mariea Clonts, MD   PT End of Session - 08/10/21 1117     Visit Number 5    Number of Visits 12    Date for PT Re-Evaluation 09/04/21    PT Start Time 1115    PT Stop Time 1200    PT Time Calculation (min) 45 min    Equipment Utilized During Treatment Other (comment)   rollator   Activity Tolerance Patient tolerated treatment well    Behavior During Therapy [facility] for tasks assessed/performed             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   BACK SURGERY     hardware in back   cyst removed from ovary     FRACTURE SURGERY     LUMBAR SPINE   removal of BB in finger  2008   SALIVARY GLAND SURGERY     tumor reomoved from stomach     Patient Active Problem List   Diagnosis Date Noted   Vertigo 07/24/2021   Fall at home, initial encounter 07/24/2021   GERD (gastroesophageal reflux disease) 07/24/2021   TIA (transient ischemic attack) 05/31/2021   Full incontinence of feces 02/13/2021   Urinary incontinence 02/13/2021   Hospital discharge follow-up 12/12/2020   Depression, major, single episode, moderate (HCC) 10/22/2020   Chronic pain of right knee 11/01/2018   GAD (generalized anxiety disorder) 11/01/2018   Vitamin D deficiency 11/01/2018   Acute bacterial endocarditis 07/27/2018   Ataxia 09/08/2016   Dysarthria 09/08/2016   H/o Prior ischemic stroke (HCC) 09/03/2016   Chronic hepatitis C virus infection (HCC) 05/09/2016   History of stroke 05/09/2016   History of  substance abuse (HCC) 05/09/2016   Stroke-like symptoms 05/09/2016   Sinusitis 05/09/2016   Heart murmur 01/08/2016   IVDU (intravenous drug user) 01/08/2016   COPD (chronic obstructive pulmonary disease) (HCC) 10/31/2015   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   AKI (acute kidney injury) (HCC) 10/04/2015   Dyslipidemia 10/04/2015   HTN (hypertension) 10/04/2015   Discitis 10/04/2015   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/08/2015   Muscular deconditioning 05/24/2013   Postlaminectomy syndrome, lumbar region 12/04/2012   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   Adverse drug reaction 06/17/2012   Anemia 06/17/2012   Chronic low back pain 06/17/2012   DDD (degenerative disc disease), lumbosacral 06/17/2012   Depressive disorder 06/17/2012   Insomnia 06/17/2012   Pain in soft tissues of limb 06/17/2012   Myalgia and myositis 06/17/2012   Thoracic or lumbosacral neuritis or radiculitis 06/17/2012   Sacroiliitis (HCC) 06/17/2012   Opioid use disorder, severe, dependence (HCC) 07/30/2011    Class: Acute   Precordial pain 02/18/2010   TOBACCO ABUSE 12/05/2009   Anxiety state 12/05/2009   Cigarette nicotine dependence without complication 12/05/2009   Palpitations 12/05/2009    REFERRING DIAG: Stroke like symptoms  THERAPY DIAG:  Difficulty in walking, not elsewhere classified  Muscle weakness (generalized)  Rationale for Evaluation and Treatment Rehabilitation  PERTINENT HISTORY: multiple CVA's, multiple lumbar surgery, chronic low back pain  PRECAUTIONS: Fall  SUBJECTIVE: Pt arrives for today's treatment session denying any pain.  Pt reports she has stitches in her leg wound, but it is feeling much better.   PAIN:  Are you having pain? No   TODAY'S TREATMENT:                                7/31     EXERCISE LOG  Exercise Repetitions and Resistance Comments  Rows Red x20 reps   Extension Red x20 reps   Horizontal  abduction Red x20 reps   Shoulder Er Red x20 reps   [employer] 20 reps   Seated marches 4# x 20 reps   Ball Squeezes 2 mins   Hip Abduction Green x 20 reps   LAQ 4# x 20 reps   UBE 90 RPM x8 min   Nustep Lvl 4 x 15 mins   STS     Blank cell = exercise not performed today    PATIENT EDUCATION:   HOME EXERCISE:    PT Long Term Goals - 06/16/21 0802       PT LONG TERM GOAL #1   Title Patient will be independent with her HEP.    Time 6    Period Weeks    Status New    Target Date 07/27/21      PT LONG TERM GOAL #2   Title Patient will be able to improve her five time sit to stand to 13 seconds or less with upper extremity support for improved safety with transfers.    Time 6    Period Weeks    Status New    Target Date 07/27/21      PT LONG TERM GOAL #3   Title Patient will improve her gait speed to at least 0.6 m/s for improved household mobility.    Time 6    Period Weeks    Status New    Target Date 07/27/21              Plan - 07/31/21 1119     Clinical Impression Statement Pt arrives for today's treatment session denying any pain.  Pt reports that her leg wound is feeling much better.  Pt introduced to several seated LE exercises and additional resisted shoulder exercises to increase strength and function.  Pt requiring min cues for proper technique and to concentrate on upright posture.  Pt denied any pain at completion of today's treatment session.   Personal Factors and Comorbidities Other;Time since onset of injury/illness/exacerbation;Transportation;Comorbidity 3+    Comorbidities HTN, history of multiple CVA's, COPD, hepititus C,    Examination-Activity Limitations Locomotion Level;Transfers;Stairs;Stand    Examination-Participation Restrictions Cleaning;Community Activity    Stability/Clinical Decision Making Evolving/Moderate complexity    Rehab Potential Fair    PT Frequency 2x / week    PT Duration 6 weeks    PT Treatment/Interventions ADLs/Self  Care Home Management;Electrical Stimulation;Gait training;Stair training;Functional mobility training;Therapeutic activities;Therapeutic exercise;Balance training;Neuromuscular re-education;Patient/family education    PT Next Visit Plan nustep, upper and lower extremity strengthening, gait training, postural stability    Consulted and Agree with Plan of Care Patient             Newman Pies, PTA 08/10/2021, 11:58 AM

## 2021-08-11 DIAGNOSIS — I63339 Cerebral infarction due to thrombosis of unspecified posterior cerebral artery: Secondary | ICD-10-CM | POA: Diagnosis not present

## 2021-08-12 DIAGNOSIS — I63339 Cerebral infarction due to thrombosis of unspecified posterior cerebral artery: Secondary | ICD-10-CM | POA: Diagnosis not present

## 2021-08-13 DIAGNOSIS — I63339 Cerebral infarction due to thrombosis of unspecified posterior cerebral artery: Secondary | ICD-10-CM | POA: Diagnosis not present

## 2021-08-14 ENCOUNTER — Ambulatory Visit: Payer: Medicaid Other | Attending: Family Medicine

## 2021-08-14 ENCOUNTER — Encounter: Payer: Self-pay | Admitting: Family Medicine

## 2021-08-14 ENCOUNTER — Ambulatory Visit: Payer: Medicaid Other | Admitting: Family Medicine

## 2021-08-14 VITALS — BP 128/86 | HR 60 | Temp 98.4°F | Ht 72.0 in | Wt 197.0 lb

## 2021-08-14 DIAGNOSIS — Z5189 Encounter for other specified aftercare: Secondary | ICD-10-CM | POA: Diagnosis not present

## 2021-08-14 DIAGNOSIS — M6281 Muscle weakness (generalized): Secondary | ICD-10-CM | POA: Insufficient documentation

## 2021-08-14 DIAGNOSIS — R262 Difficulty in walking, not elsewhere classified: Secondary | ICD-10-CM | POA: Insufficient documentation

## 2021-08-14 DIAGNOSIS — L03115 Cellulitis of right lower limb: Secondary | ICD-10-CM

## 2021-08-14 DIAGNOSIS — I63339 Cerebral infarction due to thrombosis of unspecified posterior cerebral artery: Secondary | ICD-10-CM | POA: Diagnosis not present

## 2021-08-14 NOTE — Progress Notes (Signed)
Subjective:  Patient ID: Alice [name], female    DOB: 13-May-1965, 56 y.o.   MRN: [id number]  Patient Care Team: Daryll Drown, NP as PCP - General (Nurse Practitioner) Shaune Leeks as Social Worker   Chief Complaint:  Wound Check   HPI: Alice [name] is a 56 y.o. female presenting on 08/14/2021 for Wound Check   Wound Check Treated in ED: Treated in ED 07/24/2021. Seen in office on 07/31/2021 for suture removal. Had developed cellulitis and was placed on oral antibiotics. Previous treatment included oral antibiotics, wound cleansing or irrigation and laceration repair. There has been no drainage from the wound. The redness has improved. The swelling has improved. There is no pain present. She has no difficulty moving the affected extremity or digit.    Relevant past medical, surgical, family, and social history reviewed and updated as indicated.  Allergies and medications reviewed and updated. Data reviewed: Chart in Epic.   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   BACK SURGERY     hardware in back   cyst removed from ovary     FRACTURE SURGERY     LUMBAR SPINE   removal of BB in finger  2008   SALIVARY GLAND SURGERY     tumor reomoved from stomach      Social History   Socioeconomic History   Marital status: Widowed    Spouse name: Not on file   Number of children: Not on file   Years of education: Not on file   Highest education level: Not on file  Occupational History   Occupation: disabled  Tobacco Use   Smoking status: Every Day    Packs/day: 0.50    Years: 40.00    Total pack years: 20.00    Types: Cigarettes    Smokeless tobacco: Never  Vaping Use   Vaping Use: Never used  Substance and Sexual Activity   Alcohol use: Not Currently    Comment: today   Drug use: Not Currently    Types: Heroin, "Crack" cocaine, Oxycodone, Cocaine    Comment: last used 05/24/20   Sexual activity: Not Currently  Other Topics Concern   Not on file  Social History Narrative   Single, 2 children; disabled.    Social Determinants of Health   Financial Resource Strain: Low Risk  (07/28/2021)   Overall Financial Resource Strain (CARDIA)    Difficulty of Paying Living Expenses: Not hard at all  Food Insecurity: No Food Insecurity (07/28/2021)   Hunger Vital Sign    Worried About Running Out of Food in the Last Year: Never true    Ran Out of Food in the Last Year: Never true  Transportation Needs: No Transportation Needs (07/28/2021)   PRAPARE - Administrator, Civil Service (Medical): No    Lack of Transportation (Non-Medical): No  Physical Activity: Not on file  Stress: Not on file  Social Connections: Not on file  Intimate Partner Violence: Not on file    Outpatient Encounter Medications as of 08/14/2021  Medication Sig   albuterol (VENTOLIN HFA) 108 (90 Base) MCG/ACT inhaler Inhale 2 puffs into the lungs every 6 (six) hours as needed for wheezing or shortness of breath.   aspirin EC 81 MG tablet Take 1 tablet (81 mg total) by mouth daily with breakfast. For Stroke Prevention   baclofen (LIORESAL) 10 MG tablet Take 1 tablet (10 mg total) by mouth 2 (two) times daily.   gabapentin (NEURONTIN) 100 MG capsule Take 100 mg by mouth 2 (two) times daily.   lisinopril (ZESTRIL) 10 MG tablet Take 1 tablet (10 mg total) by mouth daily.   nicotine (NICODERM CQ - DOSED IN MG/24 HOURS) 14 mg/24hr patch Place 1 patch (14 mg total) onto the skin daily.   pantoprazole (PROTONIX) 40 MG tablet Take 1 tablet (40 mg total) by mouth daily.   SUBOXONE 8-2 MG FILM Place 0.5 Film under the tongue every 6 (six) hours.    umeclidinium-vilanterol (ANORO ELLIPTA) 62.5-25 MCG/ACT AEPB Inhale 1 puff into the lungs daily. For COPD   No facility-administered encounter medications on file as of 08/14/2021.    Allergies  Allergen Reactions   Benadryl [Diphenhydramine Hcl] Hives   Codeine Rash   Tetracycline Itching and Rash    Review of Systems  Constitutional:  Negative for activity change, appetite change, chills, fatigue and fever.  HENT: Negative.    Eyes: Negative.   Respiratory:  Negative for cough, chest tightness and shortness of breath.   Cardiovascular:  Negative for chest pain, palpitations and leg swelling.  Gastrointestinal:  Negative for abdominal pain, blood in stool, constipation, diarrhea, nausea and vomiting.  Endocrine: Negative.   Genitourinary:  Negative for dysuria, frequency and urgency.  Musculoskeletal:  Positive for arthralgias, back pain, gait problem and myalgias.  Skin:  Positive for wound.  Allergic/Immunologic: Negative.   Neurological:  Negative for dizziness, weakness and headaches.  Hematological: Negative.   Psychiatric/Behavioral:  Negative for confusion, hallucinations, sleep disturbance and suicidal ideas.   All other systems reviewed and are negative.       Objective:  BP 128/86   Pulse 60   Temp 98.4 F (36.9 C)   Ht 6' (1.829 m)   Wt 197 lb (89.4 kg)   LMP 07/30/2011   SpO2 95%   BMI 26.72 kg/m    Wt Readings from Last 3 Encounters:  08/14/21 197 lb (89.4 kg)  07/31/21 194 lb (88 kg)  07/24/21 198 lb (89.8 kg)    Physical Exam Vitals and nursing note reviewed.  Constitutional:      Appearance: She is ill-appearing (chronically ill).  HENT:     Head: Normocephalic and atraumatic.  Eyes:     Pupils: Pupils are equal, round, and reactive to light.  Cardiovascular:     Rate and Rhythm: Normal rate and regular rhythm.     Pulses: Normal pulses.     Heart sounds: Normal heart sounds.  Pulmonary:     Effort: Pulmonary effort is normal.     Breath  sounds: Normal breath sounds.  Musculoskeletal:     Right lower leg: No edema.     Left lower leg: No edema.  Skin:    General: Skin is warm and dry.     Capillary Refill: Capillary refill takes less than 2 seconds.     Findings: Wound present.       Neurological:     General: No focal deficit present.     Mental Status: She is alert and oriented to person, place, and  time.     Gait: Gait abnormal (slow, using rolling walker).  Psychiatric:        Mood and Affect: Mood normal.        Behavior: Behavior normal.        Thought Content: Thought content normal.        Judgment: Judgment normal.      Results for orders placed or performed during the hospital encounter of 07/24/21  Resp Panel by RT-PCR (Flu A&B, Covid) Anterior Nasal Swab   Specimen: Anterior Nasal Swab  Result Value Ref Range   SARS Coronavirus 2 by RT PCR NEGATIVE NEGATIVE   Influenza A by PCR NEGATIVE NEGATIVE   Influenza B by PCR NEGATIVE NEGATIVE  Protime-INR  Result Value Ref Range   Prothrombin Time 13.1 11.4 - 15.2 seconds   INR 1.0 0.8 - 1.2  APTT  Result Value Ref Range   aPTT 27 24 - 36 seconds  Comprehensive metabolic panel  Result Value Ref Range   Sodium 138 135 - 145 mmol/L   Potassium 4.4 3.5 - 5.1 mmol/L   Chloride 103 98 - 111 mmol/L   CO2 28 22 - 32 mmol/L   Glucose, Bld 101 (H) 70 - 99 mg/dL   BUN 9 6 - 20 mg/dL   Creatinine, Ser 1.61 0.44 - 1.00 mg/dL   Calcium 9.1 8.9 - 09.6 mg/dL   Total Protein 7.9 6.5 - 8.1 g/dL   Albumin 4.2 3.5 - 5.0 g/dL   AST 18 15 - 41 U/L   ALT 8 0 - 44 U/L   Alkaline Phosphatase 89 38 - 126 U/L   Total Bilirubin 1.0 0.3 - 1.2 mg/dL   GFR, Estimated >04 >54 mL/min   Anion gap 7 5 - 15  Urine rapid drug screen (hosp performed)  Result Value Ref Range   Opiates NONE DETECTED NONE DETECTED   Cocaine NONE DETECTED NONE DETECTED   Benzodiazepines NONE DETECTED NONE DETECTED   Amphetamines NONE DETECTED NONE DETECTED   Tetrahydrocannabinol NONE DETECTED  NONE DETECTED   Barbiturates NONE DETECTED NONE DETECTED  Urinalysis, Routine w reflex microscopic Urine, Clean Catch  Result Value Ref Range   Color, Urine STRAW (A) YELLOW   APPearance CLEAR CLEAR   Specific Gravity, Urine 1.006 1.005 - 1.030   pH 8.0 5.0 - 8.0   Glucose, UA NEGATIVE NEGATIVE mg/dL   Hgb urine dipstick NEGATIVE NEGATIVE   Bilirubin Urine NEGATIVE NEGATIVE   Ketones, ur NEGATIVE NEGATIVE mg/dL   Protein, ur NEGATIVE NEGATIVE mg/dL   Nitrite NEGATIVE NEGATIVE   Leukocytes,Ua NEGATIVE NEGATIVE  CBC WITH DIFFERENTIAL  Result Value Ref Range   WBC 6.7 4.0 - 10.5 K/uL   RBC 4.09 3.87 - 5.11 MIL/uL   Hemoglobin 12.8 12.0 - 15.0 g/dL   HCT 09.8 11.9 - 14.7 %   MCV 91.9 80.0 - 100.0 fL   MCH 31.3 26.0 - 34.0 pg   MCHC 34.0 30.0 - 36.0 g/dL   RDW 82.9 56.2 - 13.0 %   Platelets 222 150 - 400 K/uL   nRBC 0.0 0.0 - 0.2 %   Neutrophils Relative % 74 %   Neutro Abs 5.0 1.7 - 7.7 K/uL   Lymphocytes Relative 17 %   Lymphs Abs 1.2 0.7 - 4.0 K/uL   Monocytes Relative 5 %   Monocytes Absolute 0.4 0.1 - 1.0 K/uL   Eosinophils Relative 3 %   Eosinophils Absolute 0.2 0.0 - 0.5 K/uL   Basophils Relative 1 %  Basophils Absolute 0.1 0.0 - 0.1 K/uL   Immature Granulocytes 0 %   Abs Immature Granulocytes 0.02 0.00 - 0.07 K/uL  Ethanol  Result Value Ref Range   Alcohol, Ethyl (B) <10 <10 mg/dL  Lipid panel  Result Value Ref Range   Cholesterol 192 0 - 200 mg/dL   Triglycerides 93 <784 mg/dL   HDL 43 >69 mg/dL   Total CHOL/HDL Ratio 4.5 RATIO   VLDL 19 0 - 40 mg/dL   LDL Cholesterol 629 (H) 0 - 99 mg/dL  CBC  Result Value Ref Range   WBC 6.3 4.0 - 10.5 K/uL   RBC 3.98 3.87 - 5.11 MIL/uL   Hemoglobin 12.3 12.0 - 15.0 g/dL   HCT 52.8 41.3 - 24.4 %   MCV 91.5 80.0 - 100.0 fL   MCH 30.9 26.0 - 34.0 pg   MCHC 33.8 30.0 - 36.0 g/dL   RDW 01.0 27.2 - 53.6 %   Platelets 202 150 - 400 K/uL   nRBC 0.0 0.0 - 0.2 %  Comprehensive metabolic panel  Result Value Ref Range    Sodium 142 135 - 145 mmol/L   Potassium 3.7 3.5 - 5.1 mmol/L   Chloride 108 98 - 111 mmol/L   CO2 28 22 - 32 mmol/L   Glucose, Bld 106 (H) 70 - 99 mg/dL   BUN 8 6 - 20 mg/dL   Creatinine, Ser 6.44 0.44 - 1.00 mg/dL   Calcium 8.9 8.9 - 03.4 mg/dL   Total Protein 7.0 6.5 - 8.1 g/dL   Albumin 3.6 3.5 - 5.0 g/dL   AST 12 (L) 15 - 41 U/L   ALT 8 0 - 44 U/L   Alkaline Phosphatase 75 38 - 126 U/L   Total Bilirubin 0.5 0.3 - 1.2 mg/dL   GFR, Estimated >74 >25 mL/min   Anion gap 6 5 - 15  Blood Alcohol Level  Result Value Ref Range   Alcohol, Ethyl (B) <10 <10 mg/dL  I-stat chem 8, ED  Result Value Ref Range   Sodium 142 135 - 145 mmol/L   Potassium 3.9 3.5 - 5.1 mmol/L   Chloride 103 98 - 111 mmol/L   BUN 7 6 - 20 mg/dL   Creatinine, Ser 9.56 0.44 - 1.00 mg/dL   Glucose, Bld 95 70 - 99 mg/dL   Calcium, Ion 3.87 (L) 1.15 - 1.40 mmol/L   TCO2 27 22 - 32 mmol/L   Hemoglobin 12.9 12.0 - 15.0 g/dL   HCT 56.4 33.2 - 95.1 %  POC urine preg, ED  Result Value Ref Range   Preg Test, Ur NEGATIVE NEGATIVE  CBG monitoring, ED  Result Value Ref Range   Glucose-Capillary 92 70 - 99 mg/dL  ECHOCARDIOGRAM COMPLETE  Result Value Ref Range   Weight 3,168 oz   Height 72 in   BP 140/96 mmHg   AR max vel 2.81 cm2   AV Area VTI 2.87 cm2   AV Mean grad 3.0 mmHg   AV Peak grad 6.3 mmHg   Ao pk vel 1.25 m/s   AV Area mean vel 2.68 cm2   MV VTI 0.43 cm2   Area-P 1/2 3.93 cm2   S' Lateral 3.10 cm   MV M vel 2.10 m/s   MV Peak grad 17.6 mmHg       Pertinent labs & imaging results that were available during my care of the patient were reviewed by me and considered in my medical decision making.  Assessment & Plan:  Alice [name] was seen today for wound check.  Diagnoses and all orders for this visit:  Cellulitis of right lower extremity Visit for wound check Cellulitis has resolved and wound is healing well. Pt aware to continue wound care at home, handout provided. Report new symptoms.      Continue all other maintenance medications.  Follow up plan: Return if symptoms worsen or fail to improve.   Continue healthy lifestyle choices, including diet (rich in fruits, vegetables, and lean proteins, and low in salt and simple carbohydrates) and exercise (at least 30 minutes of moderate physical activity daily).  Educational handout given for wound care  The above assessment and management plan was discussed with the patient. The patient verbalized understanding of and has agreed to the management plan. Patient is aware to call the clinic if they develop any new symptoms or if symptoms persist or worsen. Patient is aware when to return to the clinic for a follow-up visit. Patient educated on when it is appropriate to go to the emergency department.   Kari BaarsMichelle Theodosia Bahena, FNP-C [facility] [phone]

## 2021-08-14 NOTE — Therapy (Signed)
OUTPATIENT PHYSICAL THERAPY TREATMENT NOTE   Patient Name: Alice [name] MRN: [id number] DOB:06-03-1965, 56 y.o., female Today's Date: 08/14/2021   REFERRING PROVIDER: Mariea Clonts, MD   PT End of Session - 08/14/21 1118     Visit Number 6    Number of Visits 12    Date for PT Re-Evaluation 09/04/21    PT Start Time 1115    Equipment Utilized During Treatment Other (comment)   rollator   Activity Tolerance Patient tolerated treatment well    Behavior During Therapy [facility] for tasks assessed/performed             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   BACK SURGERY     hardware in back   cyst removed from ovary     FRACTURE SURGERY     LUMBAR SPINE   removal of BB in finger  2008   SALIVARY GLAND SURGERY     tumor reomoved from stomach     Patient Active Problem List   Diagnosis Date Noted   Vertigo 07/24/2021   Fall at home, initial encounter 07/24/2021   GERD (gastroesophageal reflux disease) 07/24/2021   TIA (transient ischemic attack) 05/31/2021   Full incontinence of feces 02/13/2021   Urinary incontinence 02/13/2021   Hospital discharge follow-up 12/12/2020   Depression, major, single episode, moderate (HCC) 10/22/2020   Chronic pain of right knee 11/01/2018   GAD (generalized anxiety disorder) 11/01/2018   Vitamin D deficiency 11/01/2018   Acute bacterial endocarditis 07/27/2018   Ataxia 09/08/2016   Dysarthria 09/08/2016   H/o Prior ischemic stroke (HCC) 09/03/2016   Chronic hepatitis C virus infection (HCC) 05/09/2016   History of stroke 05/09/2016   History of substance abuse (HCC) 05/09/2016   Stroke-like symptoms  05/09/2016   Sinusitis 05/09/2016   Heart murmur 01/08/2016   IVDU (intravenous drug user) 01/08/2016   COPD (chronic obstructive pulmonary disease) (HCC) 10/31/2015   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   AKI (acute kidney injury) (HCC) 10/04/2015   Dyslipidemia 10/04/2015   HTN (hypertension) 10/04/2015   Discitis 10/04/2015   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/08/2015   Muscular deconditioning 05/24/2013   Postlaminectomy syndrome, lumbar region 12/04/2012   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   Adverse drug reaction 06/17/2012   Anemia 06/17/2012   Chronic low back pain 06/17/2012   DDD (degenerative disc disease), lumbosacral 06/17/2012   Depressive disorder 06/17/2012   Insomnia 06/17/2012   Pain in soft tissues of limb 06/17/2012   Myalgia and myositis 06/17/2012   Thoracic or lumbosacral neuritis or radiculitis 06/17/2012   Sacroiliitis (HCC) 06/17/2012   Opioid use disorder, severe, dependence (HCC) 07/30/2011    Class: Acute   Precordial pain 02/18/2010   TOBACCO ABUSE 12/05/2009   Anxiety state 12/05/2009   Cigarette nicotine dependence without complication 12/05/2009   Palpitations 12/05/2009    REFERRING DIAG: Stroke like symptoms  THERAPY DIAG:  Difficulty in walking, not elsewhere classified  Muscle weakness (generalized)  Rationale for Evaluation and Treatment Rehabilitation  PERTINENT HISTORY: multiple CVA's,  multiple lumbar surgery, chronic low back pain  PRECAUTIONS: Fall  SUBJECTIVE: Pt arrives for today's treatment session reporting back pain.    PAIN:  Are you having pain? Yes: NPRS scale: 6/10 Pain location: low back   TODAY'S TREATMENT:                                8/3     EXERCISE LOG  Exercise Repetitions and Resistance Comments  Rows Red x25 reps   Extension Red x25 reps   Horizontal abduction Red x25 reps   Shoulder Er Red x25 reps   Snow Angels    Seated marches 4# x 25  reps   Ball Squeezes 2 mins   Hip Abduction Green x 2 mins   LAQ 4# x 25 reps   UBE 90 RPM x8 min   Nustep Lvl 4 x 15 mins   Ham Curls Red tband x 25 reps    Blank cell = exercise not performed today    PATIENT EDUCATION:   HOME EXERCISE:    PT Long Term Goals - 06/16/21 0802       PT LONG TERM GOAL #1   Title Patient will be independent with her HEP.    Time 6    Period Weeks    Status New    Target Date 07/27/21      PT LONG TERM GOAL #2   Title Patient will be able to improve her five time sit to stand to 13 seconds or less with upper extremity support for improved safety with transfers.    Time 6    Period Weeks    Status New    Target Date 07/27/21      PT LONG TERM GOAL #3   Title Patient will improve her gait speed to at least 0.6 m/s for improved household mobility.    Time 6    Period Weeks    Status New    Target Date 07/27/21              Plan - 07/31/21 1119     Clinical Impression Statement Pt arrives for today's treatment session reporting 6/10 low back pain.  Pt able to tolerate increased reps with all exercises today.  Pt requiring min cues for proper technique and posture.  Pt denied increased pain at completion of today's treatment session, but does endorse increased fatigue.    Personal Factors and Comorbidities Other;Time since onset of injury/illness/exacerbation;Transportation;Comorbidity 3+    Comorbidities HTN, history of multiple CVA's, COPD, hepititus C,    Examination-Activity Limitations Locomotion Level;Transfers;Stairs;Stand    Examination-Participation Restrictions Cleaning;Community Activity    Stability/Clinical Decision Making Evolving/Moderate complexity    Rehab Potential Fair    PT Frequency 2x / week    PT Duration 6 weeks    PT Treatment/Interventions ADLs/Self Care Home Management;Electrical Stimulation;Gait training;Stair training;Functional mobility training;Therapeutic activities;Therapeutic exercise;Balance  training;Neuromuscular re-education;Patient/family education    PT Next Visit Plan nustep, upper and lower extremity strengthening, gait training, postural stability    Consulted and Agree with Plan of Care Patient             Newman Pies, PTA 08/14/2021, 11:58 AM

## 2021-08-15 DIAGNOSIS — I63339 Cerebral infarction due to thrombosis of unspecified posterior cerebral artery: Secondary | ICD-10-CM | POA: Diagnosis not present

## 2021-08-16 DIAGNOSIS — I63339 Cerebral infarction due to thrombosis of unspecified posterior cerebral artery: Secondary | ICD-10-CM | POA: Diagnosis not present

## 2021-08-17 ENCOUNTER — Other Ambulatory Visit: Payer: Self-pay

## 2021-08-17 DIAGNOSIS — I63339 Cerebral infarction due to thrombosis of unspecified posterior cerebral artery: Secondary | ICD-10-CM | POA: Diagnosis not present

## 2021-08-17 NOTE — Patient Outreach (Signed)
Medicaid Managed Care Social Work Note  08/17/2021 Name:  Channa Hazelett MRN:  [id number] DOB:  Aug 01, 1965  Alice [name] is an 56 y.o. year old female who is a primary patient of Daryll Drown, NP.  The [facility] Managed Care Coordination team was consulted for assistance with:  Level of Care Concerns  Ms. Yeoman was given information about Medicaid Managed Care Coordination team services today. [name] Patient agreed to services and verbal consent obtained.  Engaged with patient  for by telephone forfollow up visit in response to referral for case management and/or care coordination services.   Assessments/Interventions:  Review of past medical history, allergies, medications, health status, including review of consultants reports, laboratory and other test data, was performed as part of comprehensive evaluation and provision of chronic care management services.  SDOH: (Social Determinant of Health) assessments and interventions performed: BSW completed a telephone outreach with patient. She stated she did not mention anything to her PCP about more PCS hours. BSW sent PCP a message but did not receive a response. Patient stated she would ask her PCP about it on her next appointment. Patient stated no other resources are needed at this time.   Advanced Directives Status:  Not addressed in this encounter.  Care Plan                 Allergies  Allergen Reactions   Benadryl [Diphenhydramine Hcl] Hives   Codeine Rash   Tetracycline Itching and Rash    Medications Reviewed Today     Reviewed by Sonny Masters, FNP (Family Nurse Practitioner) on 08/14/21 at 1213  Med List Status: <None>   Medication Order Taking? Sig Documenting Provider Last Dose Status Informant  albuterol (VENTOLIN HFA) 108 (90 Base) MCG/ACT inhaler [id number] Yes Inhale 2 puffs into the lungs every 6 (six) hours as needed for wheezing or shortness of breath. Shon Hale, MD Taking Active   aspirin EC 81 MG tablet  [id number] Yes Take 1 tablet (81 mg total) by mouth daily with breakfast. For Stroke Prevention Shon Hale, MD Taking Active   baclofen (LIORESAL) 10 MG tablet [id number] Yes Take 1 tablet (10 mg total) by mouth 2 (two) times daily. Shon Hale, MD Taking Active   gabapentin (NEURONTIN) 100 MG capsule [id number] Yes Take 100 mg by mouth 2 (two) times daily. [provider] Taking Active Self  lisinopril (ZESTRIL) 10 MG tablet [id number] Yes Take 1 tablet (10 mg total) by mouth daily. Shon Hale, MD Taking Active   nicotine (NICODERM CQ - DOSED IN MG/24 HOURS) 14 mg/24hr patch [id number] Yes Place 1 patch (14 mg total) onto the skin daily. Shon Hale, MD Taking Active   pantoprazole (PROTONIX) 40 MG tablet [id number] Yes Take 1 tablet (40 mg total) by mouth daily. Shon Hale, MD Taking Active   SUBOXONE 8-2 MG FILM [id number] Yes Place 0.5 Film under the tongue every 6 (six) hours. [provider] Taking Active Self  umeclidinium-vilanterol (ANORO ELLIPTA) 62.5-25 MCG/ACT AEPB [id number] Yes Inhale 1 puff into the lungs daily. For COPD Shon Hale, MD Taking Active             Patient Active Problem List   Diagnosis Date Noted   Vertigo 07/24/2021   Fall at home, initial encounter 07/24/2021   GERD (gastroesophageal reflux disease) 07/24/2021   TIA (transient ischemic attack) 05/31/2021   Full incontinence of feces 02/13/2021   Urinary incontinence 02/13/2021   Hospital discharge follow-up 12/12/2020   Depression, major, single episode, moderate (  HCC) 10/22/2020   Chronic pain of right knee 11/01/2018   GAD (generalized anxiety disorder) 11/01/2018   Vitamin D deficiency 11/01/2018   Acute bacterial endocarditis 07/27/2018   Ataxia 09/08/2016   Dysarthria 09/08/2016   H/o Prior ischemic stroke (HCC) 09/03/2016   Chronic hepatitis C virus infection (HCC) 05/09/2016   History of stroke 05/09/2016   History of substance abuse (HCC) 05/09/2016    Stroke-like symptoms 05/09/2016   Sinusitis 05/09/2016   Heart murmur 01/08/2016   IVDU (intravenous drug user) 01/08/2016   COPD (chronic obstructive pulmonary disease) (HCC) 10/31/2015   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   AKI (acute kidney injury) (HCC) 10/04/2015   Dyslipidemia 10/04/2015   HTN (hypertension) 10/04/2015   Discitis 10/04/2015   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/08/2015   Muscular deconditioning 05/24/2013   Postlaminectomy syndrome, lumbar region 12/04/2012   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   Adverse drug reaction 06/17/2012   Anemia 06/17/2012   Chronic low back pain 06/17/2012   DDD (degenerative disc disease), lumbosacral 06/17/2012   Depressive disorder 06/17/2012   Insomnia 06/17/2012   Pain in soft tissues of limb 06/17/2012   Myalgia and myositis 06/17/2012   Thoracic or lumbosacral neuritis or radiculitis 06/17/2012   Sacroiliitis (HCC) 06/17/2012   Opioid use disorder, severe, dependence (HCC) 07/30/2011    Class: Acute   Precordial pain 02/18/2010   TOBACCO ABUSE 12/05/2009   Anxiety state 12/05/2009   Cigarette nicotine dependence without complication 12/05/2009   Palpitations 12/05/2009    Conditions to be addressed/monitored per PCP order:   level of care   There are no care plans that you recently modified to display for this patient.   Follow up:  Patient agrees to Care Plan and Follow-up.  Plan: The Managed Medicaid care management team will reach out to the patient again over the next 30 days.  Date/time of next scheduled Social Work care management/care coordination outreach:  09/17/21  Gus Puma, Kenard Gower, [facility]  [facility]  High Risk Managed Medicaid Team  [phone]

## 2021-08-17 NOTE — Patient Instructions (Signed)
Visit Information  Alice [name] was given information about Medicaid Managed Care team care coordination services as a part of their [employer] Community [employer] Medicaid benefit. Alice [name] verbally consented to engagement with the Medicaid Managed Care team.   If you are experiencing a medical emergency, please call 911 or report to your local emergency department or urgent care.   If you have a non-emergency medical problem during routine business hours, please contact your provider's office and ask to speak with a nurse.   For questions related to your [employer] Medicaid, please call: [phone] or visit the homepage here: [email or web address]  If you would like to schedule transportation through your [employer] Medicaid, please call the following number at least 2 days in advance of your appointment: [phone]   Rides for urgent appointments can also be made after hours by calling Member Services.  Call the Behavioral Health Crisis Line at 1-877-334-1141, at any time, 24 hours a day, 7 days a week. If you are in danger or need immediate medical attention call 911.  If you would like help to quit smoking, call 1-800-QUIT-NOW (1-800-784-8669) OR Espaol: 1-855-Djelo-Ya (1-855-335-3569) o para ms informacin haga clic aqu or Text READY to 200-400 to register via text  Alice [name] - following are the goals we discussed in your visit today:   Goals Addressed   None      Social Worker will follow up in 30 days.   Alice [name], BSW, MHA [facility]  [place]  High Risk Managed Medicaid Team  [phone]   Following is a copy of your plan of care:  There are no care plans that you recently modified to display for this patient.   

## 2021-08-18 DIAGNOSIS — I63339 Cerebral infarction due to thrombosis of unspecified posterior cerebral artery: Secondary | ICD-10-CM | POA: Diagnosis not present

## 2021-08-19 DIAGNOSIS — I63339 Cerebral infarction due to thrombosis of unspecified posterior cerebral artery: Secondary | ICD-10-CM | POA: Diagnosis not present

## 2021-08-20 DIAGNOSIS — I63339 Cerebral infarction due to thrombosis of unspecified posterior cerebral artery: Secondary | ICD-10-CM | POA: Diagnosis not present

## 2021-08-21 DIAGNOSIS — I63339 Cerebral infarction due to thrombosis of unspecified posterior cerebral artery: Secondary | ICD-10-CM | POA: Diagnosis not present

## 2021-08-22 DIAGNOSIS — I63339 Cerebral infarction due to thrombosis of unspecified posterior cerebral artery: Secondary | ICD-10-CM | POA: Diagnosis not present

## 2021-08-23 DIAGNOSIS — I63339 Cerebral infarction due to thrombosis of unspecified posterior cerebral artery: Secondary | ICD-10-CM | POA: Diagnosis not present

## 2021-08-24 DIAGNOSIS — I63339 Cerebral infarction due to thrombosis of unspecified posterior cerebral artery: Secondary | ICD-10-CM | POA: Diagnosis not present

## 2021-08-25 DIAGNOSIS — I63339 Cerebral infarction due to thrombosis of unspecified posterior cerebral artery: Secondary | ICD-10-CM | POA: Diagnosis not present

## 2021-08-26 DIAGNOSIS — I63339 Cerebral infarction due to thrombosis of unspecified posterior cerebral artery: Secondary | ICD-10-CM | POA: Diagnosis not present

## 2021-08-27 DIAGNOSIS — I63339 Cerebral infarction due to thrombosis of unspecified posterior cerebral artery: Secondary | ICD-10-CM | POA: Diagnosis not present

## 2021-08-28 ENCOUNTER — Ambulatory Visit: Payer: Medicaid Other | Admitting: Nurse Practitioner

## 2021-08-28 ENCOUNTER — Encounter: Payer: Self-pay | Admitting: Nurse Practitioner

## 2021-08-28 ENCOUNTER — Ambulatory Visit (INDEPENDENT_AMBULATORY_CARE_PROVIDER_SITE_OTHER): Payer: Medicaid Other

## 2021-08-28 ENCOUNTER — Ambulatory Visit: Payer: Medicaid Other

## 2021-08-28 VITALS — BP 125/84 | HR 68 | Temp 97.8°F | Ht 72.0 in | Wt 193.0 lb

## 2021-08-28 DIAGNOSIS — M25551 Pain in right hip: Secondary | ICD-10-CM | POA: Diagnosis not present

## 2021-08-28 DIAGNOSIS — M6281 Muscle weakness (generalized): Secondary | ICD-10-CM

## 2021-08-28 DIAGNOSIS — R262 Difficulty in walking, not elsewhere classified: Secondary | ICD-10-CM

## 2021-08-28 DIAGNOSIS — I63339 Cerebral infarction due to thrombosis of unspecified posterior cerebral artery: Secondary | ICD-10-CM | POA: Diagnosis not present

## 2021-08-28 MED ORDER — DICLOFENAC SODIUM 1 % EX GEL: 2.0000 g | Freq: Four times a day (QID) | CUTANEOUS | 1 refills | Status: DC

## 2021-08-28 NOTE — Therapy (Signed)
OUTPATIENT PHYSICAL THERAPY TREATMENT NOTE   Patient Name: Alice [name] MRN: [id number] DOB:09/29/1965, 56 y.o., female Today's Date: 08/28/2021   REFERRING PROVIDER: Mariea Clonts, MD   PT End of Session - 08/28/21 1119     Visit Number 7    Number of Visits 12    Date for PT Re-Evaluation 09/04/21    PT Start Time 1115    PT Stop Time 1158    PT Time Calculation (min) 43 min    Equipment Utilized During Treatment Other (comment)   rollator   Activity Tolerance Patient tolerated treatment well    Behavior During Therapy [facility] for tasks assessed/performed             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   BACK SURGERY     hardware in back   cyst removed from ovary     FRACTURE SURGERY     LUMBAR SPINE   removal of BB in finger  2008   SALIVARY GLAND SURGERY     tumor reomoved from stomach     Patient Active Problem List   Diagnosis Date Noted   Vertigo 07/24/2021   Fall at home, initial encounter 07/24/2021   GERD (gastroesophageal reflux disease) 07/24/2021   TIA (transient ischemic attack) 05/31/2021   Full incontinence of feces 02/13/2021   Urinary incontinence 02/13/2021   Hospital discharge follow-up 12/12/2020   Depression, major, single episode, moderate (HCC) 10/22/2020   Chronic pain of right knee 11/01/2018   GAD (generalized anxiety disorder) 11/01/2018   Vitamin D deficiency 11/01/2018   Acute bacterial endocarditis 07/27/2018   Ataxia 09/08/2016   Dysarthria 09/08/2016   H/o Prior ischemic stroke (HCC) 09/03/2016   Chronic hepatitis C virus infection (HCC) 05/09/2016   History of stroke 05/09/2016   History of  substance abuse (HCC) 05/09/2016   Stroke-like symptoms 05/09/2016   Sinusitis 05/09/2016   Heart murmur 01/08/2016   IVDU (intravenous drug user) 01/08/2016   COPD (chronic obstructive pulmonary disease) (HCC) 10/31/2015   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   AKI (acute kidney injury) (HCC) 10/04/2015   Dyslipidemia 10/04/2015   HTN (hypertension) 10/04/2015   Discitis 10/04/2015   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/08/2015   Muscular deconditioning 05/24/2013   Postlaminectomy syndrome, lumbar region 12/04/2012   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   Adverse drug reaction 06/17/2012   Anemia 06/17/2012   Chronic low back pain 06/17/2012   DDD (degenerative disc disease), lumbosacral 06/17/2012   Depressive disorder 06/17/2012   Insomnia 06/17/2012   Pain in soft tissues of limb 06/17/2012   Myalgia and myositis 06/17/2012   Thoracic or lumbosacral neuritis or radiculitis 06/17/2012   Sacroiliitis (HCC) 06/17/2012   Opioid use disorder, severe, dependence (HCC) 07/30/2011    Class: Acute   Precordial pain 02/18/2010   TOBACCO ABUSE 12/05/2009   Anxiety state 12/05/2009   Cigarette nicotine dependence without complication 12/05/2009   Palpitations 12/05/2009    REFERRING DIAG: Stroke like symptoms  THERAPY DIAG:  Difficulty in walking, not elsewhere classified  Muscle weakness (generalized)  Rationale for Evaluation and Treatment Rehabilitation  PERTINENT HISTORY: multiple CVA's, multiple lumbar surgery, chronic low back pain  PRECAUTIONS: Fall  SUBJECTIVE: Pt arrives for today's treatment session reporting back pain.    PAIN:  Are you having pain? Yes: NPRS scale: 9/10 Pain location: low back   TODAY'S TREATMENT:                                8/18     EXERCISE LOG  Exercise Repetitions and Resistance Comments  Rows Red x30  reps   Extension Red x30 reps   Horizontal abduction Red x30 reps   Shoulder  Er Red x30 reps   [employer] 20 reps   Seated marches 4# x 25 reps   Ball Squeezes    Hip Abduction    LAQ    UBE 90 RPM x 10 min   Recumbent Bike Lvl 4 x 15 mins   Ham Curls     Blank cell = exercise not performed today    PATIENT EDUCATION:   HOME EXERCISE:    PT Long Term Goals - 08/28/21 1123       PT LONG TERM GOAL #1   Title Patient will be independent with her HEP.    Time 6    Period Weeks    Status On-going    Target Date 07/27/21      PT LONG TERM GOAL #2   Title Patient will be able to improve her five time sit to stand to 13 seconds or less with upper extremity support for improved safety with transfers.    Time 6    Period Weeks    Status On-going    Target Date 07/27/21      PT LONG TERM GOAL #3   Title Patient will improve her gait speed to at least 0.6 m/s for improved household mobility.    Time 6    Period Weeks    Status On-going    Target Date 07/27/21              Plan - 07/31/21 1119     Clinical Impression Statement Pt arrives for today's treatment session reporting 9/10 low back pain.  Pt able to tolerate introduction to recumbent bike today for warm up.  Pt declined LE exercises today due to back pain and wished to concentrate on BUE and postural exercises.  Pt instructed in standing snow angels with back against wall.  Pt requiring verbal and tactile cues for proper posture. Pt reported mild decrease in pain at completion of today's treatment session.   Personal Factors and Comorbidities Other;Time since onset of injury/illness/exacerbation;Transportation;Comorbidity 3+    Comorbidities HTN, history of multiple CVA's, COPD, hepititus C,    Examination-Activity Limitations Locomotion Level;Transfers;Stairs;Stand    Examination-Participation Restrictions Cleaning;Community Activity    Stability/Clinical Decision Making Evolving/Moderate complexity    Rehab Potential Fair    PT Frequency 2x / week    PT Duration 6 weeks    PT  Treatment/Interventions ADLs/Self Care Home Management;Electrical Stimulation;Gait training;Stair training;Functional mobility training;Therapeutic activities;Therapeutic exercise;Balance training;Neuromuscular re-education;Patient/family education    PT Next Visit Plan nustep, upper and lower extremity strengthening, gait training, postural stability    Consulted and Agree with Plan of Care Patient             Newman Pies, PTA 08/28/2021, 12:04 PM

## 2021-08-28 NOTE — Patient Instructions (Signed)
Hip Pain The hip is the joint between the upper legs and the lower pelvis. The bones, cartilage, tendons, and muscles of your hip joint support your body and allow you to move around. Hip pain can range from a minor ache to severe pain in one or both of your hips. The pain may be felt on the inside of the hip joint near the groin, or on the outside near the buttocks and upper thigh. You may also have swelling or stiffness in your hip area. Follow these instructions at home: Managing pain, stiffness, and swelling     If directed, put ice on the painful area. To do this: Put ice in a plastic bag. Place a towel between your skin and the bag. Leave the ice on for 20 minutes, 2-3 times a day. If directed, apply heat to the affected area as often as told by your health care provider. Use the heat source that your health care provider recommends, such as a moist heat pack or a heating pad. Place a towel between your skin and the heat source. Leave the heat on for 20-30 minutes. Remove the heat if your skin turns bright red. This is especially important if you are unable to feel pain, heat, or cold. You may have a greater risk of getting burned. Activity Do exercises as told by your health care provider. Avoid activities that cause pain. General instructions  Take over-the-counter and prescription medicines only as told by your health care provider. Keep a journal of your symptoms. Write down: How often you have hip pain. The location of your pain. What the pain feels like. What makes the pain worse. Sleep with a pillow between your legs on your most comfortable side. Keep all follow-up visits as told by your health care provider. This is important. Contact a health care provider if: You cannot put weight on your leg. Your pain or swelling continues or gets worse after one week. It gets harder to walk. You have a fever. Get help right away if: You fall. You have a sudden increase in pain  and swelling in your hip. Your hip is red or swollen or very tender to touch. Summary Hip pain can range from a minor ache to severe pain in one or both of your hips. The pain may be felt on the inside of the hip joint near the groin, or on the outside near the buttocks and upper thigh. Avoid activities that cause pain. Write down how often you have hip pain, the location of the pain, what makes it worse, and what it feels like. This information is not intended to replace advice given to you by your health care provider. Make sure you discuss any questions you have with your health care provider. Document Revised: 05/15/2018 Document Reviewed: 05/15/2018 Elsevier Patient Education  2023 [employer]  

## 2021-08-28 NOTE — Progress Notes (Signed)
Acute Office Visit  Subjective:     Patient ID: Alice [name], female    DOB: 1965/01/20, 56 y.o.   MRN: [id number]  Chief Complaint  Patient presents with   Flank Pain    Right side pain    Back Pain This is a new problem. Episode onset: The past 3 days. The problem occurs constantly. The problem is unchanged. The pain is present in the gluteal. The pain does not radiate. The pain is at a severity of 8/10. The symptoms are aggravated by sitting, standing and twisting. Stiffness is present All day. Pertinent negatives include no abdominal pain, bladder incontinence, bowel incontinence, numbness or paresis. She has tried nothing for the symptoms.     Review of Systems  Constitutional: Negative.   HENT: Negative.    Respiratory: Negative.    Cardiovascular: Negative.   Gastrointestinal:  Negative for abdominal pain and bowel incontinence.  Genitourinary: Negative.  Negative for bladder incontinence.  Musculoskeletal:  Positive for back pain.  Skin: Negative.   Neurological:  Negative for numbness.  All other systems reviewed and are negative.       Objective:    BP 125/84   Pulse 68   Temp 97.8 F (36.6 C)   Ht 6' (1.829 m)   Wt 193 lb (87.5 kg)   LMP 07/30/2011   SpO2 96%   BMI 26.18 kg/m  BP Readings from Last 3 Encounters:  08/28/21 125/84  08/14/21 128/86  07/31/21 114/75   Wt Readings from Last 3 Encounters:  08/28/21 193 lb (87.5 kg)  08/14/21 197 lb (89.4 kg)  07/31/21 194 lb (88 kg)      Physical Exam Vitals and nursing note reviewed.  HENT:     Head: Normocephalic.     Right Ear: External ear normal.     Left Ear: External ear normal.     Nose: Nose normal.     Mouth/Throat:     Mouth: Mucous membranes are moist.     Pharynx: Oropharynx is clear.  Eyes:     Conjunctiva/sclera: Conjunctivae normal.  Cardiovascular:     Rate and Rhythm: Normal rate.     Pulses: Normal pulses.     Heart sounds: Normal heart sounds.  Pulmonary:     Effort:  Pulmonary effort is normal.     Breath sounds: Normal breath sounds.  Abdominal:     General: Bowel sounds are normal.  Musculoskeletal:     Lumbar back: Tenderness present. Decreased range of motion.  Skin:    General: Skin is warm.     Findings: No rash.  Neurological:     General: No focal deficit present.     Mental Status: She is alert and oriented to person, place, and time.  Psychiatric:        Behavior: Behavior normal.     No results found for any visits on 08/28/21.      Assessment & Plan:  Back pain  not well controlled in the past 3 days, completed imaging results pending, heating pad recommended, Voltaren topical gel as needed, patient is refusing pain shot in clinic and steroids as she has had bad experience in the past.   Patient knows to follow up with worsening unresolved symptoms.   Problem List Items Addressed This Visit   None Visit Diagnoses     Pain of right hip    -  Primary   Relevant Medications   diclofenac Sodium (VOLTAREN) 1 % GEL   Other Relevant  Orders   DG Hip Unilat W OR W/O Pelvis 2-3 Views Right       Meds ordered this encounter  Medications   diclofenac Sodium (VOLTAREN) 1 % GEL    Sig: Apply 2 g topically 4 (four) times daily.    Dispense:  50 g    Refill:  1    Order Specific Question:   Supervising Provider    Answer:   Mechele Claude [[id number]]    Return if symptoms worsen or fail to improve, for Hip pain.  Daryll Drown, NP

## 2021-08-29 DIAGNOSIS — I63339 Cerebral infarction due to thrombosis of unspecified posterior cerebral artery: Secondary | ICD-10-CM | POA: Diagnosis not present

## 2021-08-30 DIAGNOSIS — I63339 Cerebral infarction due to thrombosis of unspecified posterior cerebral artery: Secondary | ICD-10-CM | POA: Diagnosis not present

## 2021-08-31 DIAGNOSIS — I63339 Cerebral infarction due to thrombosis of unspecified posterior cerebral artery: Secondary | ICD-10-CM | POA: Diagnosis not present

## 2021-09-01 DIAGNOSIS — I63339 Cerebral infarction due to thrombosis of unspecified posterior cerebral artery: Secondary | ICD-10-CM | POA: Diagnosis not present

## 2021-09-02 DIAGNOSIS — I63339 Cerebral infarction due to thrombosis of unspecified posterior cerebral artery: Secondary | ICD-10-CM | POA: Diagnosis not present

## 2021-09-03 ENCOUNTER — Telehealth: Payer: Self-pay | Admitting: Nurse Practitioner

## 2021-09-03 DIAGNOSIS — I63339 Cerebral infarction due to thrombosis of unspecified posterior cerebral artery: Secondary | ICD-10-CM | POA: Diagnosis not present

## 2021-09-04 DIAGNOSIS — I63339 Cerebral infarction due to thrombosis of unspecified posterior cerebral artery: Secondary | ICD-10-CM | POA: Diagnosis not present

## 2021-09-05 DIAGNOSIS — I63339 Cerebral infarction due to thrombosis of unspecified posterior cerebral artery: Secondary | ICD-10-CM | POA: Diagnosis not present

## 2021-09-06 DIAGNOSIS — I63339 Cerebral infarction due to thrombosis of unspecified posterior cerebral artery: Secondary | ICD-10-CM | POA: Diagnosis not present

## 2021-09-07 DIAGNOSIS — I63339 Cerebral infarction due to thrombosis of unspecified posterior cerebral artery: Secondary | ICD-10-CM | POA: Diagnosis not present

## 2021-09-08 DIAGNOSIS — I63339 Cerebral infarction due to thrombosis of unspecified posterior cerebral artery: Secondary | ICD-10-CM | POA: Diagnosis not present

## 2021-09-08 NOTE — Telephone Encounter (Signed)
Pt needs new PCS form done w/ change of status to get more hours. Form filled out & placed on providers desk

## 2021-09-09 DIAGNOSIS — I63339 Cerebral infarction due to thrombosis of unspecified posterior cerebral artery: Secondary | ICD-10-CM | POA: Diagnosis not present

## 2021-09-10 DIAGNOSIS — I63339 Cerebral infarction due to thrombosis of unspecified posterior cerebral artery: Secondary | ICD-10-CM | POA: Diagnosis not present

## 2021-09-11 ENCOUNTER — Encounter: Payer: Self-pay | Admitting: Physical Therapy

## 2021-09-11 ENCOUNTER — Ambulatory Visit: Payer: Medicaid Other | Attending: Family Medicine | Admitting: Physical Therapy

## 2021-09-11 DIAGNOSIS — I63339 Cerebral infarction due to thrombosis of unspecified posterior cerebral artery: Secondary | ICD-10-CM | POA: Diagnosis not present

## 2021-09-11 DIAGNOSIS — M6281 Muscle weakness (generalized): Secondary | ICD-10-CM

## 2021-09-11 DIAGNOSIS — R32 Unspecified urinary incontinence: Secondary | ICD-10-CM | POA: Diagnosis not present

## 2021-09-11 DIAGNOSIS — I639 Cerebral infarction, unspecified: Secondary | ICD-10-CM | POA: Diagnosis not present

## 2021-09-11 DIAGNOSIS — J449 Chronic obstructive pulmonary disease, unspecified: Secondary | ICD-10-CM | POA: Diagnosis not present

## 2021-09-11 DIAGNOSIS — R262 Difficulty in walking, not elsewhere classified: Secondary | ICD-10-CM

## 2021-09-11 NOTE — Therapy (Signed)
OUTPATIENT PHYSICAL THERAPY TREATMENT NOTE   Patient Name: Alice [name] MRN: [id number] DOB:Apr 01, 1965, 56 y.o., female Today's Date: 09/11/2021   REFERRING PROVIDER: Mariea Clonts, MD   PT End of Session - 09/11/21 1059     Visit Number 8    Number of Visits 12    Date for PT Re-Evaluation 09/04/21    PT Start Time 1116    PT Stop Time 1205    PT Time Calculation (min) 49 min    Equipment Utilized During Treatment Other (comment)   rollator   Activity Tolerance Patient tolerated treatment well    Behavior During Therapy [facility] for tasks assessed/performed             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   BACK SURGERY     hardware in back   cyst removed from ovary     FRACTURE SURGERY     LUMBAR SPINE   removal of BB in finger  2008   SALIVARY GLAND SURGERY     tumor reomoved from stomach     Patient Active Problem List   Diagnosis Date Noted   Vertigo 07/24/2021   Fall at home, initial encounter 07/24/2021   GERD (gastroesophageal reflux disease) 07/24/2021   TIA (transient ischemic attack) 05/31/2021   Full incontinence of feces 02/13/2021   Urinary incontinence 02/13/2021   Hospital discharge follow-up 12/12/2020   Depression, major, single episode, moderate (HCC) 10/22/2020   Chronic pain of right knee 11/01/2018   GAD (generalized anxiety disorder) 11/01/2018   Vitamin D deficiency 11/01/2018   Acute bacterial endocarditis 07/27/2018   Ataxia 09/08/2016   Dysarthria 09/08/2016   H/o Prior ischemic stroke (HCC) 09/03/2016   Chronic hepatitis C virus infection (HCC) 05/09/2016   History of stroke 05/09/2016   History of  substance abuse (HCC) 05/09/2016   Stroke-like symptoms 05/09/2016   Sinusitis 05/09/2016   Heart murmur 01/08/2016   IVDU (intravenous drug user) 01/08/2016   COPD (chronic obstructive pulmonary disease) (HCC) 10/31/2015   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   AKI (acute kidney injury) (HCC) 10/04/2015   Dyslipidemia 10/04/2015   HTN (hypertension) 10/04/2015   Discitis 10/04/2015   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/08/2015   Muscular deconditioning 05/24/2013   Postlaminectomy syndrome, lumbar region 12/04/2012   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   Adverse drug reaction 06/17/2012   Anemia 06/17/2012   Chronic low back pain 06/17/2012   DDD (degenerative disc disease), lumbosacral 06/17/2012   Depressive disorder 06/17/2012   Insomnia 06/17/2012   Pain in soft tissues of limb 06/17/2012   Myalgia and myositis 06/17/2012   Thoracic or lumbosacral neuritis or radiculitis 06/17/2012   Sacroiliitis (HCC) 06/17/2012   Opioid use disorder, severe, dependence (HCC) 07/30/2011    Class: Acute   Precordial pain 02/18/2010   TOBACCO ABUSE 12/05/2009   Anxiety state 12/05/2009   Cigarette nicotine dependence without complication 12/05/2009   Palpitations 12/05/2009    REFERRING DIAG: Stroke like symptoms  THERAPY DIAG:  Difficulty in walking, not elsewhere classified  Muscle weakness (generalized)  Rationale for Evaluation and Treatment Rehabilitation  PERTINENT HISTORY: multiple CVA's, multiple lumbar surgery, chronic low back pain  PRECAUTIONS: Fall  SUBJECTIVE: Pt arrives for today's treatment session reporting back pain. Has had catching in her side and has xrays but they concluded that it was sciatica.  PAIN:  Are you having pain? Yes: NPRS scale: 9/10 Pain location: low back   TODAY'S TREATMENT:                                9/1     EXERCISE LOG  Exercise Repetitions and Resistance Comments  Horizontal  abduction Red x30 reps   D2  Red; attempted but reported pain from R shoulder to hip   Core press X10 reps 5 sec   Seated marches AROM x15 reps   LAQ BLE x15 reps   UBE 90 RPM x 10 min   Stationary bike L3, seat 7 x10 min    Blank cell = exercise not performed today   Modalities  Date:  Unattended Estim: Lumbar, IFC, 15 mins, Pain   PATIENT EDUCATION:   HOME EXERCISE:    PT Long Term Goals - 08/28/21 1123       PT LONG TERM GOAL #1   Title Patient will be independent with her HEP.    Time 6    Period Weeks    Status On-going    Target Date 07/27/21      PT LONG TERM GOAL #2   Title Patient will be able to improve her five time sit to stand to 13 seconds or less with upper extremity support for improved safety with transfers.    Time 6    Period Weeks    Status On-going    Target Date 07/27/21      PT LONG TERM GOAL #3   Title Patient will improve her gait speed to at least 0.6 m/s for improved household mobility.    Time 6    Period Weeks    Status On-going    Target Date 07/27/21              Plan - 07/31/21 1119     Clinical Impression Statement Patient presented in clinic with reports of recent sciatic exacerbation in recent days. Patient inquired whether e-stim would be appropriate for her which is listed in her POC. Patient able to tolerate moderate therex but limited UE involvement as D2 caused pain in R thoracic area to hip. Patient also reported numbness in her knees. VCs for proper sitting posture were provided during seated session. Normal stimulation response noted following removal of the modalities.   Personal Factors and Comorbidities Other;Time since onset of injury/illness/exacerbation;Transportation;Comorbidity 3+    Comorbidities HTN, history of multiple CVA's, COPD, hepititus C,    Examination-Activity Limitations Locomotion Level;Transfers;Stairs;Stand    Examination-Participation Restrictions Cleaning;Community Activity     Stability/Clinical Decision Making Evolving/Moderate complexity    Rehab Potential Fair    PT Frequency 2x / week    PT Duration 6 weeks    PT Treatment/Interventions ADLs/Self Care Home Management;Electrical Stimulation;Gait training;Stair training;Functional mobility training;Therapeutic activities;Therapeutic exercise;Balance training;Neuromuscular re-education;Patient/family education    PT Next Visit Plan nustep, upper and lower extremity strengthening, gait training, postural stability    Consulted and Agree with Plan of Care Patient             Marvell Fuller, PTA 09/11/2021, 12:09 PM

## 2021-09-12 DIAGNOSIS — I63339 Cerebral infarction due to thrombosis of unspecified posterior cerebral artery: Secondary | ICD-10-CM | POA: Diagnosis not present

## 2021-09-13 DIAGNOSIS — I63339 Cerebral infarction due to thrombosis of unspecified posterior cerebral artery: Secondary | ICD-10-CM | POA: Diagnosis not present

## 2021-09-15 ENCOUNTER — Ambulatory Visit: Payer: Medicaid Other | Admitting: Physical Therapy

## 2021-09-15 ENCOUNTER — Encounter: Payer: Self-pay | Admitting: Physical Therapy

## 2021-09-15 DIAGNOSIS — M6281 Muscle weakness (generalized): Secondary | ICD-10-CM

## 2021-09-15 DIAGNOSIS — R262 Difficulty in walking, not elsewhere classified: Secondary | ICD-10-CM

## 2021-09-15 DIAGNOSIS — I63339 Cerebral infarction due to thrombosis of unspecified posterior cerebral artery: Secondary | ICD-10-CM | POA: Diagnosis not present

## 2021-09-15 NOTE — Addendum Note (Signed)
Addended by: Granville Lewis on: 09/15/2021 12:34 PM   Modules accepted: Orders

## 2021-09-15 NOTE — Therapy (Signed)
OUTPATIENT PHYSICAL THERAPY TREATMENT NOTE   Patient Name: Alice [name] MRN: [id number] DOB:[birth year], 56 y.o., female Today's Date: 09/15/2021   REFERRING PROVIDER: Mariea Clonts, MD   PT End of Session - 09/15/21 1131     Visit Number 9    Number of Visits 12    Date for PT Re-Evaluation 09/04/21    PT Start Time 1118    PT Stop Time 1202    PT Time Calculation (min) 44 min    Equipment Utilized During Treatment Other (comment)   rollator   Activity Tolerance Patient tolerated treatment well    Behavior During Therapy [facility] for tasks assessed/performed             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   BACK SURGERY     hardware in back   cyst removed from ovary     FRACTURE SURGERY     LUMBAR SPINE   removal of BB in finger  2008   SALIVARY GLAND SURGERY     tumor reomoved from stomach     Patient Active Problem List   Diagnosis Date Noted   Vertigo 07/24/2021   Fall at home, initial encounter 07/24/2021   GERD (gastroesophageal reflux disease) 07/24/2021   TIA (transient ischemic attack) 05/31/2021   Full incontinence of feces 02/13/2021   Urinary incontinence 02/13/2021   Hospital discharge follow-up 12/12/2020   Depression, major, single episode, moderate (HCC) 10/22/2020   Chronic pain of right knee 11/01/2018   GAD (generalized anxiety disorder) 11/01/2018   Vitamin D deficiency 11/01/2018   Acute bacterial endocarditis 07/27/2018   Ataxia 09/08/2016   Dysarthria 09/08/2016   H/o Prior ischemic stroke (HCC) 09/03/2016   Chronic hepatitis C virus infection (HCC) 05/09/2016   History of stroke 05/09/2016   History of  substance abuse (HCC) 05/09/2016   Stroke-like symptoms 05/09/2016   Sinusitis 05/09/2016   Heart murmur 01/08/2016   IVDU (intravenous drug user) 01/08/2016   COPD (chronic obstructive pulmonary disease) (HCC) 10/31/2015   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   AKI (acute kidney injury) (HCC) 10/04/2015   Dyslipidemia 10/04/2015   HTN (hypertension) 10/04/2015   Discitis 10/04/2015   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/08/2015   Muscular deconditioning 05/24/2013   Postlaminectomy syndrome, lumbar region 12/04/2012   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   Adverse drug reaction 06/17/2012   Anemia 06/17/2012   Chronic low back pain 06/17/2012   DDD (degenerative disc disease), lumbosacral 06/17/2012   Depressive disorder 06/17/2012   Insomnia 06/17/2012   Pain in soft tissues of limb 06/17/2012   Myalgia and myositis 06/17/2012   Thoracic or lumbosacral neuritis or radiculitis 06/17/2012   Sacroiliitis (HCC) 06/17/2012   Opioid use disorder, severe, dependence (HCC) 07/30/2011    Class: Acute   Precordial pain 02/18/2010   TOBACCO ABUSE 12/05/2009   Anxiety state 12/05/2009   Cigarette nicotine dependence without complication 12/05/2009   Palpitations 12/05/2009    REFERRING DIAG: Stroke like symptoms  THERAPY DIAG:  Difficulty in walking, not elsewhere classified  Muscle weakness (generalized)  Rationale for Evaluation and Treatment Rehabilitation  PERTINENT HISTORY: multiple CVA's, multiple lumbar surgery, chronic low back pain  PRECAUTIONS: Fall  SUBJECTIVE: Hurting more across her upper back.  PAIN:  Are you having pain? Yes: NPRS scale: 8/10 Pain location: upper back   TODAY'S TREATMENT:                                9/5  EXERCISE LOG  Exercise Repetitions and Resistance Comments  Horizontal abduction Red x20 reps   X to V X20 reps   Horizontal abd with flexion Red x20 reps   UBE 90 RPM x 10 min    Stationary bike L4, seat 6 x10 min    Blank cell = exercise not performed today   Modalities  Date: 09/15/2021 Unattended Estim: Lumbar, IFC, 15 mins, Pain   PATIENT EDUCATION:   HOME EXERCISE:    PT Long Term Goals - 08/28/21 1123       PT LONG TERM GOAL #1   Title Patient will be independent with her HEP.    Time 6    Period Weeks    Status On-going    Target Date 07/27/21      PT LONG TERM GOAL #2   Title Patient will be able to improve her five time sit to stand to 13 seconds or less with upper extremity support for improved safety with transfers.    Time 6    Period Weeks    Status On-going    Target Date 07/27/21      PT LONG TERM GOAL #3   Title Patient will improve her gait speed to at least 0.6 m/s for improved household mobility.    Time 6    Period Weeks    Status On-going    Target Date 07/27/21              Plan - 07/31/21 1119     Clinical Impression Statement Patient reports more upper back pain today but after e-stim treatment helped her LBP and reduce symptoms in last session. Patient observed in rounded spine, forward shoulders in sitting. Patient also still ambulating with rounded spine as well. Patient's UE fatigued by end of therex session with theraband. Normal stimulation response noted following removal of the modality.   Personal Factors and Comorbidities Other;Time since onset of injury/illness/exacerbation;Transportation;Comorbidity 3+    Comorbidities HTN, history of multiple CVA's, COPD, hepititus C,    Examination-Activity Limitations Locomotion Level;Transfers;Stairs;Stand    Examination-Participation Restrictions Cleaning;Community Activity    Stability/Clinical Decision Making Evolving/Moderate complexity    Rehab Potential Fair    PT Frequency 2x / week    PT Duration 6 weeks    PT Treatment/Interventions ADLs/Self Care Home Management;Electrical Stimulation;Gait training;Stair training;Functional mobility training;Therapeutic  activities;Therapeutic exercise;Balance training;Neuromuscular re-education;Patient/family education    PT Next Visit Plan nustep, upper and lower extremity strengthening, gait training, postural stability    Consulted and Agree with Plan of Care Patient             Marvell Fuller, PTA 09/15/2021, 12:05 PM

## 2021-09-16 DIAGNOSIS — I63339 Cerebral infarction due to thrombosis of unspecified posterior cerebral artery: Secondary | ICD-10-CM | POA: Diagnosis not present

## 2021-09-16 NOTE — Telephone Encounter (Signed)
Faxed

## 2021-09-17 ENCOUNTER — Other Ambulatory Visit: Payer: Self-pay

## 2021-09-17 DIAGNOSIS — I63339 Cerebral infarction due to thrombosis of unspecified posterior cerebral artery: Secondary | ICD-10-CM | POA: Diagnosis not present

## 2021-09-17 NOTE — Patient Outreach (Signed)
Care Coordination  09/17/2021  Alice [name] Aug 02, 1965 [id number]    Medicaid Managed Care   Unsuccessful Outreach Note  09/17/2021 Name: Alice [name] MRN: [id number] DOB: [birth year]  Referred by: Daryll Drown, NP Reason for referral : High Risk Managed Medicaid (MM Social Work [employer])   An unsuccessful telephone outreach was attempted today. The patient was referred to the case management team for assistance with care management and care coordination.   Follow Up Plan: The care management team will reach out to the patient again over the next 30 days.   Gus Puma, BSW, [facility]  [place]  High Risk Managed Medicaid Team  [phone]

## 2021-09-17 NOTE — Patient Instructions (Signed)
Visit Information  [name]  - as a part of your Medicaid benefit, you are eligible for care management and care coordination services at no cost or copay. I was unable to reach you by phone today but would be happy to help you with your health related needs. Please feel free to call me @ [phone]).   A member of the Managed Medicaid care management team will reach out to you again over the next 7 days.   Gus Puma, BSW, [facility]  [place]  High Risk Managed Medicaid Team  [phone]

## 2021-09-18 DIAGNOSIS — I63339 Cerebral infarction due to thrombosis of unspecified posterior cerebral artery: Secondary | ICD-10-CM | POA: Diagnosis not present

## 2021-09-19 DIAGNOSIS — I63339 Cerebral infarction due to thrombosis of unspecified posterior cerebral artery: Secondary | ICD-10-CM | POA: Diagnosis not present

## 2021-09-20 DIAGNOSIS — I63339 Cerebral infarction due to thrombosis of unspecified posterior cerebral artery: Secondary | ICD-10-CM | POA: Diagnosis not present

## 2021-09-21 DIAGNOSIS — I63339 Cerebral infarction due to thrombosis of unspecified posterior cerebral artery: Secondary | ICD-10-CM | POA: Diagnosis not present

## 2021-09-22 ENCOUNTER — Telehealth (INDEPENDENT_AMBULATORY_CARE_PROVIDER_SITE_OTHER): Payer: Medicaid Other | Admitting: Family Medicine

## 2021-09-22 ENCOUNTER — Encounter: Payer: Self-pay | Admitting: Family Medicine

## 2021-09-22 DIAGNOSIS — I63339 Cerebral infarction due to thrombosis of unspecified posterior cerebral artery: Secondary | ICD-10-CM | POA: Diagnosis not present

## 2021-09-22 DIAGNOSIS — J441 Chronic obstructive pulmonary disease with (acute) exacerbation: Secondary | ICD-10-CM

## 2021-09-22 MED ORDER — AZITHROMYCIN 250 MG PO TABS: ORAL_TABLET | ORAL | 0 refills | Status: DC

## 2021-09-22 MED ORDER — ALBUTEROL SULFATE (2.5 MG/3ML) 0.083% IN NEBU: 2.5000 mg | INHALATION_SOLUTION | Freq: Four times a day (QID) | RESPIRATORY_TRACT | 1 refills | Status: DC | PRN

## 2021-09-22 NOTE — Progress Notes (Signed)
Virtual Visit via Telephone Note  I connected with Alice [name] on 09/22/21 at 2:10 PM by telephone and verified that I am speaking with the correct person using two identifiers. Alice [name] is currently located at home and nobody is currently with her during this visit. The provider, Gwenlyn Fudge, FNP is located in their office at time of visit.  I discussed the limitations, risks, security and privacy concerns of performing an evaluation and management service by telephone and the availability of in person appointments. I also discussed with the patient that there may be a patient responsible charge related to this service. The patient expressed understanding and agreed to proceed.  Subjective: PCP: Daryll Drown, NP  Chief Complaint  Patient presents with   Cough   Patient complains of cough, chest congestion, shortness of breath, and wheezing. Onset of symptoms was 1 week ago, gradually worsening since that time. She is drinking plenty of fluids. Evaluation to date: at home COVID test negative. Treatment to date:  inhalers . She has a history of COPD. She does smoke.    ROS: Per HPI  Current Outpatient Medications:    albuterol (VENTOLIN HFA) 108 (90 Base) MCG/ACT inhaler, Inhale 2 puffs into the lungs every 6 (six) hours as needed for wheezing or shortness of breath., Disp: 18 g, Rfl: 2   aspirin EC 81 MG tablet, Take 1 tablet (81 mg total) by mouth daily with breakfast. For Stroke Prevention, Disp: 30 tablet, Rfl: 11   baclofen (LIORESAL) 10 MG tablet, Take 1 tablet (10 mg total) by mouth 2 (two) times daily., Disp: 60 tablet, Rfl: 3   diclofenac Sodium (VOLTAREN) 1 % GEL, Apply 2 g topically 4 (four) times daily., Disp: 50 g, Rfl: 1   gabapentin (NEURONTIN) 100 MG capsule, Take 100 mg by mouth 2 (two) times daily., Disp: , Rfl:    lisinopril (ZESTRIL) 10 MG tablet, Take 1 tablet (10 mg total) by mouth daily., Disp: 30 tablet, Rfl: 3   nicotine (NICODERM CQ - DOSED IN MG/24  HOURS) 14 mg/24hr patch, Place 1 patch (14 mg total) onto the skin daily., Disp: 28 patch, Rfl: 0   pantoprazole (PROTONIX) 40 MG tablet, Take 1 tablet (40 mg total) by mouth daily., Disp: 30 tablet, Rfl: 3   SUBOXONE 8-2 MG FILM, Place 0.5 Film under the tongue every 6 (six) hours., Disp: , Rfl:    umeclidinium-vilanterol (ANORO ELLIPTA) 62.5-25 MCG/ACT AEPB, Inhale 1 puff into the lungs daily. For COPD, Disp: 30 each, Rfl: 3  Allergies  Allergen Reactions   Benadryl [Diphenhydramine Hcl] Hives   Codeine Rash   Tetracycline Itching and Rash   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   Qualifier: Diagnosis of  By: Zachary George     Chronic low back pain    Cocaine abuse (HCC)    COPD (chronic obstructive pulmonary disease) (HCC)    Heart murmur    HLD (hyperlipidemia)    Hypertension    Stroke (HCC)    x4    Observations/Objective: A&O  No respiratory distress or wheezing audible over the phone Mood, judgement, and thought processes all WNL  Assessment and Plan: 1. COPD with exacerbation (HCC) - For home use only DME Nebulizer machine - albuterol (PROVENTIL) (2.5 MG/3ML) 0.083% nebulizer solution; Take 3 mLs (2.5 mg total) by nebulization every 6 (six) hours as needed for wheezing or shortness of breath.  Dispense: 150 mL; Refill: 1 - azithromycin (ZITHROMAX Z-PAK) 250  MG tablet; Take 2 tablets (500 mg) PO today, then 1 tablet (250 mg) PO daily x4 days.  Dispense: 6 tablet; Refill: 0   Follow Up Instructions:  I discussed the assessment and treatment plan with the patient. The patient was provided an opportunity to ask questions and all were answered. The patient agreed with the plan and demonstrated an understanding of the instructions.   The patient was advised to call back or seek an in-person evaluation if the symptoms worsen or if the condition fails to improve as anticipated.  The above assessment and management plan was discussed with the patient.  The patient verbalized understanding of and has agreed to the management plan. Patient is aware to call the clinic if symptoms persist or worsen. Patient is aware when to return to the clinic for a follow-up visit. Patient educated on when it is appropriate to go to the emergency department.   Time call ended: 2:21 PM  I provided 11 minutes of non-face-to-face time during this encounter.  Deliah Boston, MSN, APRN, FNP-C [facility] 09/22/21

## 2021-09-23 DIAGNOSIS — I63339 Cerebral infarction due to thrombosis of unspecified posterior cerebral artery: Secondary | ICD-10-CM | POA: Diagnosis not present

## 2021-09-24 DIAGNOSIS — I63339 Cerebral infarction due to thrombosis of unspecified posterior cerebral artery: Secondary | ICD-10-CM | POA: Diagnosis not present

## 2021-09-25 DIAGNOSIS — I63339 Cerebral infarction due to thrombosis of unspecified posterior cerebral artery: Secondary | ICD-10-CM | POA: Diagnosis not present

## 2021-09-26 DIAGNOSIS — I63339 Cerebral infarction due to thrombosis of unspecified posterior cerebral artery: Secondary | ICD-10-CM | POA: Diagnosis not present

## 2021-09-27 ENCOUNTER — Emergency Department (HOSPITAL_COMMUNITY): Payer: Medicaid Other

## 2021-09-27 ENCOUNTER — Encounter (HOSPITAL_COMMUNITY): Payer: Self-pay | Admitting: *Deleted

## 2021-09-27 ENCOUNTER — Other Ambulatory Visit: Payer: Self-pay

## 2021-09-27 ENCOUNTER — Emergency Department (HOSPITAL_COMMUNITY)
Admission: EM | Admit: 2021-09-27 | Discharge: 2021-09-27 | Disposition: A | Discharge status: Home / Self Care | Payer: Medicaid Other | Attending: Emergency Medicine | Admitting: Emergency Medicine

## 2021-09-27 DIAGNOSIS — R0602 Shortness of breath: Secondary | ICD-10-CM | POA: Insufficient documentation

## 2021-09-27 DIAGNOSIS — R059 Cough, unspecified: Secondary | ICD-10-CM | POA: Diagnosis not present

## 2021-09-27 DIAGNOSIS — Z79899 Other long term (current) drug therapy: Secondary | ICD-10-CM | POA: Insufficient documentation

## 2021-09-27 DIAGNOSIS — I63339 Cerebral infarction due to thrombosis of unspecified posterior cerebral artery: Secondary | ICD-10-CM | POA: Diagnosis not present

## 2021-09-27 DIAGNOSIS — Z20822 Contact with and (suspected) exposure to covid-19: Secondary | ICD-10-CM | POA: Diagnosis not present

## 2021-09-27 DIAGNOSIS — R42 Dizziness and giddiness: Secondary | ICD-10-CM | POA: Diagnosis not present

## 2021-09-27 DIAGNOSIS — Z7951 Long term (current) use of inhaled steroids: Secondary | ICD-10-CM | POA: Insufficient documentation

## 2021-09-27 DIAGNOSIS — F17210 Nicotine dependence, cigarettes, uncomplicated: Secondary | ICD-10-CM | POA: Diagnosis not present

## 2021-09-27 DIAGNOSIS — I10 Essential (primary) hypertension: Secondary | ICD-10-CM | POA: Insufficient documentation

## 2021-09-27 DIAGNOSIS — H8110 Benign paroxysmal vertigo, unspecified ear: Secondary | ICD-10-CM | POA: Diagnosis not present

## 2021-09-27 DIAGNOSIS — R051 Acute cough: Secondary | ICD-10-CM | POA: Diagnosis present

## 2021-09-27 DIAGNOSIS — Z7982 Long term (current) use of aspirin: Secondary | ICD-10-CM | POA: Diagnosis not present

## 2021-09-27 DIAGNOSIS — J441 Chronic obstructive pulmonary disease with (acute) exacerbation: Secondary | ICD-10-CM | POA: Diagnosis not present

## 2021-09-27 LAB — BLOOD GAS, VENOUS
Acid-Base Excess: 5.8 mmol/L — ABNORMAL HIGH (ref 0.0–2.0)
Bicarbonate: 31.7 mmol/L — ABNORMAL HIGH (ref 20.0–28.0)
Drawn by: 27160
O2 Saturation: 75.1 %
Patient temperature: 36.6
pCO2, Ven: 49 mmHg (ref 44–60)
pH, Ven: 7.42 (ref 7.25–7.43)
pO2, Ven: 41 mmHg (ref 32–45)

## 2021-09-27 LAB — BASIC METABOLIC PANEL
Anion gap: 7 (ref 5–15)
BUN: 12 mg/dL (ref 6–20)
CO2: 28 mmol/L (ref 22–32)
Calcium: 9.2 mg/dL (ref 8.9–10.3)
Chloride: 104 mmol/L (ref 98–111)
Creatinine, Ser: 0.68 mg/dL (ref 0.44–1.00)
GFR, Estimated: >60 mL/min (ref >60)
Glucose, Bld: 118 mg/dL — ABNORMAL HIGH (ref 70–99)
Potassium: 4.1 mmol/L (ref 3.5–5.1)
Sodium: 139 mmol/L (ref 135–145)

## 2021-09-27 LAB — CBC
HCT: 38.4 % (ref 36.0–46.0)
Hemoglobin: 12.7 g/dL (ref 12.0–15.0)
MCH: 29.9 pg (ref 26.0–34.0)
MCHC: 33.1 g/dL (ref 30.0–36.0)
MCV: 90.4 fL (ref 80.0–100.0)
Platelets: 209 10*3/uL (ref 150–400)
RBC: 4.25 MIL/uL (ref 3.87–5.11)
RDW: 12.3 % (ref 11.5–15.5)
WBC: 6.4 10*3/uL (ref 4.0–10.5)
nRBC: 0 % (ref 0.0–0.2)

## 2021-09-27 LAB — RESP PANEL BY RT-PCR (FLU A&B, COVID) ARPGX2
Influenza A by PCR: NEGATIVE
Influenza B by PCR: NEGATIVE
SARS Coronavirus 2 by RT PCR: NEGATIVE

## 2021-09-27 LAB — BRAIN NATRIURETIC PEPTIDE: B Natriuretic Peptide: 17 pg/mL (ref 0.0–100.0)

## 2021-09-27 LAB — MAGNESIUM: Magnesium: 1.9 mg/dL (ref 1.7–2.4)

## 2021-09-27 MED ORDER — SODIUM CHLORIDE 0.9 % IV BOLUS: 1000.0000 mL | Freq: Once | INTRAVENOUS | Status: DC

## 2021-09-27 MED ORDER — DOXYCYCLINE HYCLATE 100 MG PO CAPS: 100.0000 mg | ORAL_CAPSULE | Freq: Two times a day (BID) | ORAL | 0 refills | Status: DC

## 2021-09-27 MED ORDER — MECLIZINE HCL 25 MG PO TABS: 25.0000 mg | ORAL_TABLET | Freq: Three times a day (TID) | ORAL | 0 refills | Status: DC | PRN

## 2021-09-27 MED ORDER — MECLIZINE HCL 12.5 MG PO TABS
25.0000 mg | ORAL_TABLET | Freq: Once | ORAL | Status: AC
  Administered 2021-09-27: 25 mg via ORAL
  Filled 2021-09-27: qty 2

## 2021-09-27 MED ORDER — GUAIFENESIN 100 MG/5ML PO LIQD: 5.0000 mL | ORAL | 0 refills | Status: DC | PRN

## 2021-09-27 MED ORDER — IPRATROPIUM-ALBUTEROL 0.5-2.5 (3) MG/3ML IN SOLN
3.0000 mL | Freq: Once | RESPIRATORY_TRACT | Status: AC
  Administered 2021-09-27: 3 mL via RESPIRATORY_TRACT
  Filled 2021-09-27: qty 3

## 2021-09-27 MED ORDER — FLUTICASONE PROPIONATE 50 MCG/ACT NA SUSP: 1.0000 | Freq: Every day | NASAL | 0 refills | Status: DC

## 2021-09-27 NOTE — ED Notes (Signed)
Pt requesting albuterol nebulizer. Reports that she uses one at home and had not used it this morning, which is against her normal routine.

## 2021-09-27 NOTE — ED Provider Notes (Signed)
[facility] EMERGENCY DEPARTMENT Provider Note   CSN: [id number] Arrival date & time: 09/27/21  0841     History  Chief Complaint  Patient presents with   Dizziness    Alice [name] is a 56 y.o. female.  Patient as above with significant medical history as below, including cocaine abuse, COPD, HLD, prior CVA, vertigo who presents to the ED with complaint of dizziness sensation. She was seen by telemedicine 9/12 televisit secondary to cough.  Concern for COPD exacerbation, given albuterol neb, Z-Pak.  Patient completed antibiotics, did not start steroids because she reports the steroids "do not agree with me."  She feels as though her cough has mildly improved, intermittent productive with green or yellow, sometimes white sputum.  Breathing is at her typical baseline.  Use her home nebulizer as prescribed, has not had increased frequency.  No home oxygen use.  She reports a room spinning sensation on standing or ambulation.  She was previously admitted secondary to fall attributed to vertigo.  She does not take the meclizine at home because she does not believe that she has vertigo.  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  Of note she was admitted in July with similar complaint, she had MRI which was unremarkable, echo, carotid Dopplers which were also stable.  Echo 7/15 EF 60-6 5%, no diastolic dysfunction.     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   BACK SURGERY     hardware in back   cyst removed from ovary     FRACTURE SURGERY     LUMBAR SPINE   removal of BB in finger  2008    SALIVARY GLAND SURGERY     tumor reomoved from stomach       The history is provided by the patient. No language interpreter was used.  Dizziness Associated symptoms: shortness of breath   Associated symptoms: no chest pain, no headaches, no nausea and no palpitations        Home Medications Prior to Admission medications   Medication Sig Start Date End Date Taking? Authorizing Provider  doxycycline (VIBRAMYCIN) 100 MG capsule Take 1 capsule (100 mg total) by mouth 2 (two) times daily. 09/27/21  Yes [name] A, DO  fluticasone (FLONASE) 50 MCG/ACT nasal spray Place 1 spray into both nostrils daily for 7 days. 09/27/21 10/04/21 Yes Sloan Leiter, DO  guaiFENesin (ROBITUSSIN) 100 MG/5ML liquid Take 5 mLs by mouth every 4 (four) hours as needed for cough or to loosen phlegm. 09/27/21  Yes Sloan Leiter, DO  meclizine (ANTIVERT) 25 MG tablet Take 1 tablet (25 mg total) by mouth 3 (three) times daily as needed for dizziness. 09/27/21  Yes [name] A, DO  albuterol (PROVENTIL) (2.5 MG/3ML) 0.083% nebulizer solution Take 3 mLs (2.5 mg total) by nebulization every 6 (six) hours as needed for wheezing or shortness of breath. 09/22/21   Gwenlyn Fudge, FNP  albuterol (VENTOLIN HFA) 108 (90 Base) MCG/ACT inhaler  Inhale 2 puffs into the lungs every 6 (six) hours as needed for wheezing or shortness of breath. 07/25/21   Roxan Hockey, MD  aspirin EC 81 MG tablet Take 1 tablet (81 mg total) by mouth daily with breakfast. For Stroke Prevention 07/25/21   Roxan Hockey, MD  azithromycin (ZITHROMAX Z-PAK) 250 MG tablet Take 2 tablets (500 mg) PO today, then 1 tablet (250 mg) PO daily x4 days. 09/22/21   Loman Brooklyn, FNP  baclofen (LIORESAL) 10 MG tablet Take 1 tablet (10 mg total) by mouth 2 (two) times daily. 07/25/21   Roxan Hockey, MD  diclofenac Sodium (VOLTAREN) 1 % GEL Apply 2 g topically 4 (four) times daily. 08/28/21   Ivy Lynn, NP  gabapentin (NEURONTIN) 100 MG capsule Take  100 mg by mouth 2 (two) times daily. 01/13/21   [provider]  lisinopril (ZESTRIL) 10 MG tablet Take 1 tablet (10 mg total) by mouth daily. 07/25/21   Roxan Hockey, MD  nicotine (NICODERM CQ - DOSED IN MG/24 HOURS) 14 mg/24hr patch Place 1 patch (14 mg total) onto the skin daily. 07/26/21   Roxan Hockey, MD  pantoprazole (PROTONIX) 40 MG tablet Take 1 tablet (40 mg total) by mouth daily. 07/25/21   Emokpae, Courage, MD  SUBOXONE 8-2 MG FILM Place 0.5 Film under the tongue every 6 (six) hours. 10/15/20   [provider]  umeclidinium-vilanterol (ANORO ELLIPTA) 62.5-25 MCG/ACT AEPB Inhale 1 puff into the lungs daily. For COPD 07/25/21   Roxan Hockey, MD      Allergies    Benadryl [diphenhydramine hcl], Codeine, and Tetracycline    Review of Systems   Review of Systems  Constitutional:  Negative for activity change and fever.  HENT:  Positive for congestion. Negative for facial swelling and trouble swallowing.   Eyes:  Negative for discharge and redness.  Respiratory:  Positive for cough, shortness of breath and wheezing.   Cardiovascular:  Negative for chest pain and palpitations.  Gastrointestinal:  Negative for abdominal pain and nausea.  Genitourinary:  Negative for dysuria and flank pain.  Musculoskeletal:  Negative for back pain and gait problem.  Skin:  Negative for pallor and rash.  Neurological:  Positive for dizziness. Negative for syncope and headaches.    Physical Exam Updated Vital Signs BP (!) 136/96   Pulse 69   Temp 97.8 F (36.6 C) (Oral)   Resp 19   Ht 6' (1.829 m)   Wt 89.8 kg   LMP 07/30/2011   SpO2 100%   BMI 26.85 kg/m  Physical Exam Vitals and nursing note reviewed.  Constitutional:      General: She is not in acute distress.    Appearance: Normal appearance. She is normal weight. She is not ill-appearing.  HENT:     Head: Normocephalic and atraumatic. No raccoon eyes, Battle's sign, right periorbital erythema or left  periorbital erythema.     Comments: Mild frontal sinus discomfort with percussion     Right Ear: External ear normal. No tenderness. A middle ear effusion is present. No mastoid tenderness. No hemotympanum. Tympanic membrane is not injected, scarred or erythematous.     Left Ear: External ear normal.  No middle ear effusion. No mastoid tenderness. No hemotympanum. Tympanic membrane is scarred. Tympanic membrane is not injected or erythematous.     Nose: Nose normal.     Mouth/Throat:     Mouth: Mucous membranes are moist.  Eyes:     General: No scleral icterus.  Right eye: No discharge.        Left eye: No discharge.     Extraocular Movements: Extraocular movements intact.     Conjunctiva/sclera: Conjunctivae normal.     Pupils: Pupils are equal, round, and reactive to light.  Cardiovascular:     Rate and Rhythm: Normal rate and regular rhythm.     Pulses: Normal pulses.     Heart sounds: Normal heart sounds.  Pulmonary:     Effort: Pulmonary effort is normal. No respiratory distress.     Breath sounds: Normal breath sounds.  Abdominal:     General: Abdomen is flat.     Tenderness: There is no abdominal tenderness.  Musculoskeletal:        General: Normal range of motion.     Cervical back: Normal range of motion.     Right lower leg: No edema.     Left lower leg: No edema.  Skin:    General: Skin is warm and dry.     Capillary Refill: Capillary refill takes less than 2 seconds.  Neurological:     Mental Status: She is alert and oriented to person, place, and time.     GCS: GCS eye subscore is 4. GCS verbal subscore is 5. GCS motor subscore is 6.     Cranial Nerves: Cranial nerves 2-12 are intact. No dysarthria.     Sensory: Sensation is intact.     Motor: Motor function is intact. No weakness.     Coordination: Coordination is intact. Finger-Nose-Finger Test normal.     Gait: Gait is intact.     Comments: Strength 5/5 BLUE BLLE Fatigable horizontal nystagmus Hints exam  consistent with peripheral vertigo  Psychiatric:        Mood and Affect: Mood normal.        Behavior: Behavior normal.     ED Results / Procedures / Treatments   Labs (all labs ordered are listed, but only abnormal results are displayed) Labs Reviewed  BASIC METABOLIC PANEL - Abnormal; Notable for the following components:      Result Value   Glucose, Bld 118 (*)    All other components within normal limits  BLOOD GAS, VENOUS - Abnormal; Notable for the following components:   Bicarbonate 31.7 (*)    Acid-Base Excess 5.8 (*)    All other components within normal limits  RESP PANEL BY RT-PCR (FLU A&B, COVID) ARPGX2  CBC  MAGNESIUM  BRAIN NATRIURETIC PEPTIDE    EKG EKG Interpretation  Date/Time:  Sunday September 27 2021 09:51:41 EDT Ventricular Rate:  64 PR Interval:  161 QRS Duration: 100 QT Interval:  404 QTC Calculation: 417 R Axis:   45 Text Interpretation: Sinus rhythm Abnormal R-wave progression, early transition similar to prior, no stemi Confirmed by [name] ([id number]) on 09/27/2021 10:49:09 AM  Radiology DG Chest Portable 1 View  Result Date: 09/27/2021 CLINICAL DATA:  Congestion and shortness of breath. EXAM: PORTABLE CHEST 1 VIEW COMPARISON:  Chest x-ray dated July 18, 2021. FINDINGS: The heart size and mediastinal contours are within normal limits. Normal pulmonary vascularity. Chronically coarsened interstitial markings are likely smoking related. No focal consolidation, pleural effusion, or pneumothorax. Chronic scarring at the left costophrenic angle. No acute osseous abnormality. IMPRESSION: 1. No active disease. Electronically Signed   By: Obie Dredge M.D.   On: 09/27/2021 10:10    Procedures Procedures    Medications Ordered in ED Medications  ipratropium-albuterol (DUONEB) 0.5-2.5 (3) MG/3ML nebulizer solution 3 mL (3 mLs  Nebulization Given 09/27/21 0956)  meclizine (ANTIVERT) tablet 25 mg (25 mg Oral Given 09/27/21 1118)    ED Course/ Medical  Decision Making/ A&P                           Medical Decision Making Amount and/or Complexity of Data Reviewed Labs: ordered. Radiology: ordered. ECG/medicine tests: ordered.  Risk OTC drugs. Prescription drug management.   This patient presents to the ED with chief complaint(s) of dizzy, uri s/s with pertinent past medical history of cva, vertigo, as above which further complicates the presenting complaint. The complaint involves an extensive differential diagnosis and also carries with it a high risk of complications and morbidity.    The differential diagnosis includes but not limited to viral syndrome, copd, flu, covid, vertigo, vestibular neuritis, dysequilibrium, meniere dz, cva, etc. Serious etiologies were considered.   The initial plan is to screening labs, medications, ivf > pt missed her am duoneb, she has some wheezing, not hypoxic, will give neb here   Additional history obtained: Additional history obtained from  na Records reviewed previous admission documents and Primary Care Documents home meds, prior labs/imaging  Independent labs interpretation:  The following labs were independently interpreted:  CBC/BMP stable VBG pH stable, CO2 49 Mg wnl  Independent visualization of imaging: - I independently visualized the following imaging with scope of interpretation limited to determining acute life threatening conditions related to emergency care: CXR, which revealed no acute process, similar to prior   Cardiac monitoring was reviewed and interpreted by myself which shows SR  Treatment and Reassessment: DuoNeb, Antivert >> Dizziness sensation has resolved.  Respiratory status is stable.  Wheezing has greatly improved, now trace  Consultation: - Consulted or discussed management/test interpretation w/ external professional: na  Consideration for admission or further workup: Admission was considered   56 year old female history of COPD, continues smoke  cigarettes, home oxygen use to the ED with cough, dizziness.  Has history of vertigo.  Exam appears consistent with BPPV.  Disequilibrium.  Symptoms resolved with Antivert.  Possibly provoked by URI.  She has middle ear effusion.  No evidence of erythema or otitis media.  Concern for sinusitis.  Patient with ongoing cough mildly worse than her baseline.  She is not hypoxic.  VBG is stable.  She was recently on azithromycin, will give patient doxycycline, Robitussin, Antivert for symptomatic control at home.  flonase.  advised her to follow-up with her PCP for recheck..   The patient improved significantly and was discharged in stable condition. Detailed discussions were had with the patient regarding current findings, and need for close f/u with PCP or on call doctor. The patient has been instructed to return immediately if the symptoms worsen in any way for re-evaluation. Patient verbalized understanding and is in agreement with current care plan. All questions answered prior to discharge.    Social Determinants of health: Counseled patient for approximately 4 minutes regarding smoking cessation. Discussed risks of smoking and how they applied and affected their visit here today. Patient not ready to quit at this time, however will follow up with their primary doctor when they are.   CPT code: 1610999406: intermediate counseling for smoking cessation    Social History   Tobacco Use   Smoking status: Every Day    Packs/day: 0.50    Years: 40.00    Total pack years: 20.00    Types: Cigarettes   Smokeless tobacco: Never  Vaping Use  Vaping Use: Never used  Substance Use Topics   Alcohol use: Not Currently    Comment: today   Drug use: Not Currently    Types: Heroin, "Crack" cocaine, Oxycodone, Cocaine    Comment: last used 05/24/20            Final Clinical Impression(s) / ED Diagnoses Final diagnoses:  Acute cough  Benign paroxysmal positional vertigo, unspecified laterality   COPD exacerbation (HCC)    Rx / DC Orders ED Discharge Orders          Ordered    doxycycline (VIBRAMYCIN) 100 MG capsule  2 times daily        09/27/21 1354    guaiFENesin (ROBITUSSIN) 100 MG/5ML liquid  Every 4 hours PRN        09/27/21 1354    fluticasone (FLONASE) 50 MCG/ACT nasal spray  Daily        09/27/21 1354    meclizine (ANTIVERT) 25 MG tablet  3 times daily PRN        09/27/21 1356              Sloan Leiter, DO 09/27/21 1402

## 2021-09-27 NOTE — Discharge Instructions (Addendum)
Your covid 19 test was negative  Please stop smoking  It was a pleasure caring for you today in the emergency department.  Please return to the emergency department for any worsening or worrisome symptoms.

## 2021-09-27 NOTE — ED Triage Notes (Signed)
Pt brought in by [facility] for c/o dizziness; pt woke up with these sx;  pt states she just finished antibiotic for sinus infection  Pt denies any pain, n/v

## 2021-09-28 DIAGNOSIS — I63339 Cerebral infarction due to thrombosis of unspecified posterior cerebral artery: Secondary | ICD-10-CM | POA: Diagnosis not present

## 2021-09-29 ENCOUNTER — Telehealth (INDEPENDENT_AMBULATORY_CARE_PROVIDER_SITE_OTHER): Payer: Medicaid Other | Admitting: Nurse Practitioner

## 2021-09-29 ENCOUNTER — Encounter: Payer: Self-pay | Admitting: Nurse Practitioner

## 2021-09-29 DIAGNOSIS — J441 Chronic obstructive pulmonary disease with (acute) exacerbation: Secondary | ICD-10-CM

## 2021-09-29 DIAGNOSIS — I63339 Cerebral infarction due to thrombosis of unspecified posterior cerebral artery: Secondary | ICD-10-CM | POA: Diagnosis not present

## 2021-09-29 MED ORDER — PREDNISONE 10 MG PO TABS: 10.0000 mg | ORAL_TABLET | Freq: Every day | ORAL | 0 refills | Status: DC

## 2021-09-29 NOTE — Patient Instructions (Signed)
Chronic Obstructive Pulmonary Disease  Chronic obstructive pulmonary disease (COPD) is a long-term (chronic) lung problem. When you have COPD, it is hard for air to get in and out of your lungs. Usually the condition gets worse over time, and your lungs will never return to normal. There are things you can do to keep yourself as healthy as possible. What are the causes? Smoking. This is the most common cause. Certain genes passed from parent to child (inherited). What increases the risk? Being exposed to secondhand smoke from cigarettes, pipes, or cigars. Being exposed to chemicals and other irritants, such as fumes and dust in the work environment. Having chronic lung conditions or infections. What are the signs or symptoms? Shortness of breath, especially during physical activity. A long-term cough with a large amount of thick mucus. Sometimes, the cough may not have any mucus (dry cough). Wheezing. Breathing quickly. Skin that looks gray or blue, especially in the fingers, toes, or lips. Feeling tired (fatigue). Weight loss. Chest tightness. Having infections often. Episodes when breathing symptoms become much worse (exacerbations). At the later stages of this disease, you may have swelling in the ankles, feet, or legs. How is this treated? Taking medicines. Quitting smoking, if you smoke. Rehabilitation. This includes steps to make your body work better. It may involve a team of specialists. Doing exercises. Making changes to your diet. Using oxygen. Lung surgery. Lung transplant. Comfort measures (palliative care). Follow these instructions at home: Medicines Take over-the-counter and prescription medicines only as told by your doctor. Talk to your doctor before taking any cough or allergy medicines. You may need to avoid medicines that cause your lungs to be dry. Lifestyle If you smoke, stop smoking. Smoking makes the problem worse. Do not smoke or use any products that  contain nicotine or tobacco. If you need help quitting, ask your doctor. Avoid being around things that make your breathing worse. This may include smoke, chemicals, and fumes. Stay active, but remember to rest as well. Learn and use tips on how to manage stress and control your breathing. Make sure you get enough sleep. Most adults need at least 7 hours of sleep every night. Eat healthy foods. Eat smaller meals more often. Rest before meals. Controlled breathing Learn and use tips on how to control your breathing as told by your doctor. Try: Breathing in (inhaling) through your nose for 1 second. Then, pucker your lips and breath out (exhale) through your lips for 2 seconds. Putting one hand on your belly (abdomen). Breathe in slowly through your nose for 1 second. Your hand on your belly should move out. Pucker your lips and breathe out slowly through your lips. Your hand on your belly should move in as you breathe out.  Controlled coughing Learn and use controlled coughing to clear mucus from your lungs. Follow these steps: Lean your head a little forward. Breathe in deeply. Try to hold your breath for 3 seconds. Keep your mouth slightly open while coughing 2 times. Spit any mucus out into a tissue. Rest and do the steps again 1 or 2 times as needed. General instructions Make sure you get all the shots (vaccines) that your doctor recommends. Ask your doctor about a flu shot and a pneumonia shot. Use oxygen therapy and pulmonary rehabilitation if told by your doctor. If you need home oxygen therapy, ask your doctor if you should buy a tool to measure your oxygen level (oximeter). Make a COPD action plan with your doctor. This helps you   to know what to do if you feel worse than usual. Manage any other conditions you have as told by your doctor. Avoid going outside when it is very hot, cold, or humid. Avoid people who have a sickness you can catch (contagious). Keep all follow-up  visits. Contact a doctor if: You cough up more mucus than usual. There is a change in the color or thickness of the mucus. It is harder to breathe than usual. Your breathing is faster than usual. You have trouble sleeping. You need to use your medicines more often than usual. You have trouble doing your normal activities such as getting dressed or walking around the house. Get help right away if: You have shortness of breath while resting. You have shortness of breath that stops you from: Being able to talk. Doing normal activities. Your chest hurts for longer than 5 minutes. Your skin color is more blue than usual. Your pulse oximeter shows that you have low oxygen for longer than 5 minutes. You have a fever. You feel too tired to breathe normally. These symptoms may represent a serious problem that is an emergency. Do not wait to see if the symptoms will go away. Get medical help right away. Call your local emergency services (911 in the [place] Do not drive yourself to the hospital. Summary Chronic obstructive pulmonary disease (COPD) is a long-term lung problem. The way your lungs work will never return to normal. Usually the condition gets worse over time. There are things you can do to keep yourself as healthy as possible. Take over-the-counter and prescription medicines only as told by your doctor. If you smoke, stop. Smoking makes the problem worse. This information is not intended to replace advice given to you by your health care provider. Make sure you discuss any questions you have with your health care provider. Document Revised: 11/06/2019 Document Reviewed: 11/06/2019 Elsevier Patient Education  2023 [employer] Inc[employer]  

## 2021-09-29 NOTE — Progress Notes (Signed)
   Virtual Visit  Note Due to COVID-19 pandemic this visit was conducted virtually. This visit type was conducted due to national recommendations for restrictions regarding the COVID-19 Pandemic (e.g. social distancing, sheltering in place) in an effort to limit this patient's exposure and mitigate transmission in our community. All issues noted in this document were discussed and addressed.  A physical exam was not performed with this format.  I connected with Alice [name] on 09/29/21 at 11:30 am  by telephone and verified that I am speaking with the correct person using two identifiers. Alice [name] is currently located at home during visit. The provider, Ivy Lynn, NP is located in their office at time of visit.  I discussed the limitations, risks, security and privacy concerns of performing an evaluation and management service by telephone and the availability of in person appointments. I also discussed with the patient that there may be a patient responsible charge related to this service. The patient expressed understanding and agreed to proceed.   History and Present Illness:  URI  This is a recurrent problem. The current episode started in the past 7 days. The problem has been unchanged. There has been no fever. Associated symptoms include congestion, coughing and headaches. Pertinent negatives include no abdominal pain, rash or sore throat. Treatments tried: antibiotics, albuterol, nebulizer treatment. The treatment provided mild relief.  Recently treated for COPD excerebration with not resolution.     Review of Systems  Constitutional: Negative.  Negative for chills and fever.  HENT:  Positive for congestion. Negative for sore throat.   Respiratory:  Positive for cough.   Cardiovascular: Negative.   Gastrointestinal:  Negative for abdominal pain.  Skin: Negative.  Negative for rash.  Neurological:  Positive for headaches.  All other systems reviewed and are  negative.    Observations/Objective: Tele-visit patient not in distress  Assessment and Plan: Copd exacerbation with no resolution, continue medication as prescribed Prednisone 10 mg daily for 6 days, ( patient is unable to take high doses of prednisone due to side effects)  - Use a cool mist humidifier  -Use saline nose sprays frequently -Force fluids -For fever or aches or pains- take Tylenol or ibuprofen. -If symptoms do not improve, she may need to be COVID tested to rule this out Follow up with worsening unresolved symptoms   Follow Up Instructions: Follow up with unresolved symptoms    I discussed the assessment and treatment plan with the patient. The patient was provided an opportunity to ask questions and all were answered. The patient agreed with the plan and demonstrated an understanding of the instructions.   The patient was advised to call back or seek an in-person evaluation if the symptoms worsen or if the condition fails to improve as anticipated.  The above assessment and management plan was discussed with the patient. The patient verbalized understanding of and has agreed to the management plan. Patient is aware to call the clinic if symptoms persist or worsen. Patient is aware when to return to the clinic for a follow-up visit. Patient educated on when it is appropriate to go to the emergency department.   Time call ended:  11:12 am  I provided 12 minutes of  non face-to-face time during this encounter.    Ivy Lynn, NP

## 2021-09-30 DIAGNOSIS — I63339 Cerebral infarction due to thrombosis of unspecified posterior cerebral artery: Secondary | ICD-10-CM | POA: Diagnosis not present

## 2021-10-01 DIAGNOSIS — I63339 Cerebral infarction due to thrombosis of unspecified posterior cerebral artery: Secondary | ICD-10-CM | POA: Diagnosis not present

## 2021-10-02 ENCOUNTER — Encounter: Payer: Self-pay | Admitting: Physical Therapy

## 2021-10-02 ENCOUNTER — Ambulatory Visit: Payer: Medicaid Other | Admitting: Physical Therapy

## 2021-10-02 DIAGNOSIS — M6281 Muscle weakness (generalized): Secondary | ICD-10-CM

## 2021-10-02 DIAGNOSIS — R262 Difficulty in walking, not elsewhere classified: Secondary | ICD-10-CM | POA: Diagnosis not present

## 2021-10-02 DIAGNOSIS — I63339 Cerebral infarction due to thrombosis of unspecified posterior cerebral artery: Secondary | ICD-10-CM | POA: Diagnosis not present

## 2021-10-02 NOTE — Therapy (Deleted)
OUTPATIENT PHYSICAL THERAPY TREATMENT NOTE   Patient Name: Alice [name] MRN: [id number] DOB:05-19-1965, 56 y.o., female Today's Date: 10/02/2021   REFERRING PROVIDER: Denton Brick, MD   PT End of Session - 10/02/21 1036     Visit Number 10    Number of Visits 12    Date for PT Re-Evaluation 10/09/21    PT Start Time 1031    Equipment Utilized During Treatment Other (comment)   rollator   Activity Tolerance Patient tolerated treatment well    Behavior During Therapy [facility] for tasks assessed/performed             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   Qualifier: Diagnosis of  By: Delfino Lovett     Chronic low back pain    Cocaine abuse (HCC)    COPD (chronic obstructive pulmonary disease) ([place])    Heart murmur    HLD (hyperlipidemia)    Hypertension    Stroke ([place])    x4   Past Surgical History:  Procedure Laterality Date   ABDOMINAL HYSTERECTOMY     abd?   BACK SURGERY     hardware in back   cyst removed from ovary     FRACTURE SURGERY     LUMBAR SPINE   removal of BB in finger  2008   SALIVARY GLAND SURGERY     tumor reomoved from stomach     Patient Active Problem List   Diagnosis Date Noted   Vertigo 07/24/2021   Fall at home, initial encounter 07/24/2021   GERD (gastroesophageal reflux disease) 07/24/2021   TIA (transient ischemic attack) 05/31/2021   Full incontinence of feces 02/13/2021   Urinary incontinence 02/13/2021   Hospital discharge follow-up 12/12/2020   Depression, major, single episode, moderate ([place]) 10/22/2020   Chronic pain of right knee 11/01/2018   GAD (generalized anxiety disorder) 11/01/2018   Vitamin D deficiency 11/01/2018   Acute bacterial endocarditis 07/27/2018   Ataxia 09/08/2016   Dysarthria 09/08/2016   H/o Prior ischemic stroke ([place]) 09/03/2016   Chronic hepatitis C virus infection ([place]) 05/09/2016   History of stroke 05/09/2016   History of substance abuse ([place]) 05/09/2016   Stroke-like symptoms  05/09/2016   Sinusitis 05/09/2016   Heart murmur 01/08/2016   IVDU (intravenous drug user) 01/08/2016   COPD (chronic obstructive pulmonary disease) ([place]) 10/31/2015   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   AKI (acute kidney injury) ([place]) 10/04/2015   Dyslipidemia 10/04/2015   HTN (hypertension) 10/04/2015   Discitis 10/04/2015   Severe recurrent major depression without psychotic features ([place]) 05/08/2015   Muscular deconditioning 05/24/2013   Postlaminectomy syndrome, lumbar region 12/04/2012   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   Adverse drug reaction 06/17/2012   Anemia 06/17/2012   Chronic low back pain 06/17/2012   DDD (degenerative disc disease), lumbosacral 06/17/2012   Depressive disorder 06/17/2012   Insomnia 06/17/2012   Pain in soft tissues of limb 06/17/2012   Myalgia and myositis 06/17/2012   Thoracic or lumbosacral neuritis or radiculitis 06/17/2012   Sacroiliitis ([place]) 06/17/2012   Opioid use disorder, severe, dependence ([place]) 07/30/2011    Class: Acute   Precordial pain 02/18/2010   TOBACCO ABUSE 12/05/2009   Anxiety state 12/05/2009   Cigarette nicotine dependence without complication [id number]   Palpitations 12/05/2009    REFERRING DIAG: Stroke like symptoms  THERAPY DIAG:  Difficulty in walking, not elsewhere classified  Muscle weakness (generalized)  Rationale for Evaluation and Treatment Rehabilitation  PERTINENT HISTORY: multiple CVA's,  multiple lumbar surgery, chronic low back pain  PRECAUTIONS: Fall  SUBJECTIVE: Has been absent from PT after a recent COPD exacerbation.  PAIN:  Are you having pain? Yes: NPRS scale: 8/10 Pain location: upper back   TODAY'S TREATMENT:                                9/22  EXERCISE LOG  Exercise Repetitions and Resistance Comments  Horizontal abduction Red x20 reps   X to V X20 reps   Horizontal abd with flexion Red x20 reps   UBE 90 RPM x 8 min   Stationary bike L7, seat 6 x12 min     Blank cell = exercise not performed today   Modalities  Date: 10/02/2021 Unattended Estim: Lumbar, IFC, 15 mins, Pain   PATIENT EDUCATION:   HOME EXERCISE:    PT Long Term Goals - 08/28/21 1123       PT LONG TERM GOAL #1   Title Patient will be independent with her HEP.    Time 6    Period Weeks    Status On-going    Target Date 07/27/21      PT LONG TERM GOAL #2   Title Patient will be able to improve her five time sit to stand to 13 seconds or less with upper extremity support for improved safety with transfers.    Time 6    Period Weeks    Status On-going    Target Date 07/27/21      PT LONG TERM GOAL #3   Title Patient will improve her gait speed to at least 0.6 m/s for improved household mobility.    Time 6    Period Weeks    Status On-going    Target Date 07/27/21              Plan - 07/31/21 1119     Clinical Impression Statement Patient reports more upper back pain today but after e-stim treatment helped her LBP and reduce symptoms in last session. Patient observed in rounded spine, forward shoulders in sitting. Patient also still ambulating with rounded spine as well. Patient's UE fatigued by end of therex session with theraband. Normal stimulation response noted following removal of the modality.   Personal Factors and Comorbidities Other;Time since onset of injury/illness/exacerbation;Transportation;Comorbidity 3+    Comorbidities HTN, history of multiple CVA's, COPD, hepititus C,    Examination-Activity Limitations Locomotion Level;Transfers;Stairs;Stand    Examination-Participation Restrictions Cleaning;Community Activity    Stability/Clinical Decision Making Evolving/Moderate complexity    Rehab Potential Fair    PT Frequency 2x / week    PT Duration 6 weeks    PT Treatment/Interventions ADLs/Self Care Home Management;Electrical Stimulation;Gait training;Stair training;Functional mobility training;Therapeutic activities;Therapeutic  exercise;Balance training;Neuromuscular re-education;Patient/family education    PT Next Visit Plan nustep, upper and lower extremity strengthening, gait training, postural stability    Consulted and Agree with Plan of Care Patient             Standley Brooking, PTA 10/02/2021, 11:04 AM

## 2021-10-02 NOTE — Therapy (Addendum)
OUTPATIENT PHYSICAL THERAPY TREATMENT NOTE   Patient Name: Alice [name] MRN: [id number] DOB:04-27-1965, 56 y.o., female Today's Date: 10/02/2021   REFERRING PROVIDER: Mariea Clonts, MD   PT End of Session - 10/02/21 1036     Visit Number 10    Number of Visits 12    Date for PT Re-Evaluation 10/09/21    PT Start Time 1031    PT Stop Time 1113    PT Time Calculation (min) 42 min    Equipment Utilized During Treatment Other (comment)   rollator   Activity Tolerance Patient tolerated treatment well    Behavior During Therapy [facility] for tasks assessed/performed             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   BACK SURGERY     hardware in back   cyst removed from ovary     FRACTURE SURGERY     LUMBAR SPINE   removal of BB in finger  2008   SALIVARY GLAND SURGERY     tumor reomoved from stomach     Patient Active Problem List   Diagnosis Date Noted   Vertigo 07/24/2021   Fall at home, initial encounter 07/24/2021   GERD (gastroesophageal reflux disease) 07/24/2021   TIA (transient ischemic attack) 05/31/2021   Full incontinence of feces 02/13/2021   Urinary incontinence 02/13/2021   Hospital discharge follow-up 12/12/2020   Depression, major, single episode, moderate (HCC) 10/22/2020   Chronic pain of right knee 11/01/2018   GAD (generalized anxiety disorder) 11/01/2018   Vitamin D deficiency 11/01/2018   Acute bacterial endocarditis 07/27/2018   Ataxia 09/08/2016   Dysarthria 09/08/2016   H/o Prior ischemic stroke (HCC) 09/03/2016   Chronic hepatitis C virus infection (HCC) 05/09/2016   History of stroke 05/09/2016   History of  substance abuse (HCC) 05/09/2016   Stroke-like symptoms 05/09/2016   Sinusitis 05/09/2016   Heart murmur 01/08/2016   IVDU (intravenous drug user) 01/08/2016   COPD (chronic obstructive pulmonary disease) (HCC) 10/31/2015   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   AKI (acute kidney injury) (HCC) 10/04/2015   Dyslipidemia 10/04/2015   HTN (hypertension) 10/04/2015   Discitis 10/04/2015   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/08/2015   Muscular deconditioning 05/24/2013   Postlaminectomy syndrome, lumbar region 12/04/2012   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   Adverse drug reaction 06/17/2012   Anemia 06/17/2012   Chronic low back pain 06/17/2012   DDD (degenerative disc disease), lumbosacral 06/17/2012   Depressive disorder 06/17/2012   Insomnia 06/17/2012   Pain in soft tissues of limb 06/17/2012   Myalgia and myositis 06/17/2012   Thoracic or lumbosacral neuritis or radiculitis 06/17/2012   Sacroiliitis (HCC) 06/17/2012   Opioid use disorder, severe, dependence (HCC) 07/30/2011    Class: Acute   Precordial pain 02/18/2010   TOBACCO ABUSE 12/05/2009   Anxiety state 12/05/2009   Cigarette nicotine dependence without complication 12/05/2009   Palpitations 12/05/2009    REFERRING DIAG: Stroke like symptoms  THERAPY DIAG:  Difficulty in walking, not elsewhere classified  Muscle weakness (generalized)  Rationale for Evaluation and Treatment Rehabilitation  PERTINENT HISTORY: multiple CVA's, multiple lumbar surgery, chronic low back pain  PRECAUTIONS: Fall  SUBJECTIVE: Has been absent from PT after a recent COPD exacerbation. Tried not to lay around a lot while sick but states that back pain has been worse where she wasn't as active as she could have been.  PAIN:  Are you having pain? Yes: NPRS scale: 9/10 Pain location: upper back   TODAY'S TREATMENT:                                9/22  EXERCISE LOG  Exercise  Repetitions and Resistance Comments  UBE 90 RPM x 8 min   Stationary bike L7, seat 6 x12 min    Blank cell = exercise not performed today   Modalities  Date: 10/02/2021 Unattended Estim: Lumbar, IFC, 15 mins, Pain   PATIENT EDUCATION:   HOME EXERCISE:    PT Long Term Goals - 08/28/21 1123       PT LONG TERM GOAL #1   Title Patient will be independent with her HEP.    Time 6    Period Weeks    Status On-going    Target Date 07/27/21      PT LONG TERM GOAL #2   Title Patient will be able to improve her five time sit to stand to 13 seconds or less with upper extremity support for improved safety with transfers.    Time 6    Period Weeks    Status On-going    Target Date 07/27/21      PT LONG TERM GOAL #3   Title Patient will improve her gait speed to at least 0.6 m/s for improved household mobility.    Time 6    Period Weeks    Status On-going    Target Date 07/27/21              Plan - 07/31/21 1119     Clinical Impression Statement Patient arriving in clinic after a recent exacerbation of COPD in which she wasn't as active as she normally is causing LBP to increase. Pain during treatment rated as 9/10. Normal stimulation response noted following removal of the modality. Patient was observed ambulating with UEs resting on her rollator and in curved thoracic range. Patient also observed sitting with curved thoracic spine as well.   Personal Factors and Comorbidities Other;Time since onset of injury/illness/exacerbation;Transportation;Comorbidity 3+    Comorbidities HTN, history of multiple CVA's, COPD, hepititus C,    Examination-Activity Limitations Locomotion Level;Transfers;Stairs;Stand    Examination-Participation Restrictions Cleaning;Community Activity    Stability/Clinical Decision Making Evolving/Moderate complexity    Rehab Potential Fair    PT Frequency 2x / week    PT Duration 6 weeks    PT Treatment/Interventions ADLs/Self Care Home  Management;Electrical Stimulation;Gait training;Stair training;Functional mobility training;Therapeutic activities;Therapeutic exercise;Balance training;Neuromuscular re-education;Patient/family education    PT Next Visit Plan nustep, upper and lower extremity strengthening, gait training, postural stability    Consulted and Agree with Plan of Care Patient             Standley Brooking, PTA 10/02/2021, 11:18 AM  Progress Note Reporting Period 06/15/21 to 10/02/21  See note below for Objective Data and Assessment of Progress/Goals.   Patient has made minimal progress with skilled physical therapy as evidenced by her subjective reports and progress toward her goals. Recommend that she continue  with her current plan of care. She will be discharged at the conclusion of her plan of care if she is unable to demonstrate significant progress toward her goals.  Candi Leash, PT, DPT

## 2021-10-03 DIAGNOSIS — I63339 Cerebral infarction due to thrombosis of unspecified posterior cerebral artery: Secondary | ICD-10-CM | POA: Diagnosis not present

## 2021-10-04 DIAGNOSIS — I63339 Cerebral infarction due to thrombosis of unspecified posterior cerebral artery: Secondary | ICD-10-CM | POA: Diagnosis not present

## 2021-10-05 DIAGNOSIS — I63339 Cerebral infarction due to thrombosis of unspecified posterior cerebral artery: Secondary | ICD-10-CM | POA: Diagnosis not present

## 2021-10-06 DIAGNOSIS — I63339 Cerebral infarction due to thrombosis of unspecified posterior cerebral artery: Secondary | ICD-10-CM | POA: Diagnosis not present

## 2021-10-07 DIAGNOSIS — I63339 Cerebral infarction due to thrombosis of unspecified posterior cerebral artery: Secondary | ICD-10-CM | POA: Diagnosis not present

## 2021-10-08 DIAGNOSIS — I63339 Cerebral infarction due to thrombosis of unspecified posterior cerebral artery: Secondary | ICD-10-CM | POA: Diagnosis not present

## 2021-10-09 ENCOUNTER — Telehealth (INDEPENDENT_AMBULATORY_CARE_PROVIDER_SITE_OTHER): Payer: Medicaid Other | Admitting: Nurse Practitioner

## 2021-10-09 DIAGNOSIS — J441 Chronic obstructive pulmonary disease with (acute) exacerbation: Secondary | ICD-10-CM | POA: Diagnosis not present

## 2021-10-09 DIAGNOSIS — I63339 Cerebral infarction due to thrombosis of unspecified posterior cerebral artery: Secondary | ICD-10-CM | POA: Diagnosis not present

## 2021-10-09 MED ORDER — UMECLIDINIUM-VILANTEROL 62.5-25 MCG/ACT IN AEPB: 1.0000 | INHALATION_SPRAY | Freq: Every day | RESPIRATORY_TRACT | 3 refills | Status: DC

## 2021-10-09 NOTE — Progress Notes (Signed)
Virtual Visit via Video Note   This visit type was conducted due to national recommendations for restrictions regarding the COVID-19 Pandemic (e.g. social distancing) in an effort to limit this patient's exposure and mitigate transmission in our community.  Due to her co-morbid illnesses, this patient is at least at moderate risk for complications without adequate follow up.  This format is felt to be most appropriate for this patient at this time.  All issues noted in this document were discussed and addressed.  A limited physical exam was performed with this format.  A verbal consent was obtained for the virtual visit.   Date:  10/09/2021   ID:  Alice [name], DOB Apr 14, 1965, MRN [id number]  Patient Location: Home Provider Location: Office/Clinic  PCP:  Alice Drown, NP   Evaluation Performed: Acute visit Chief Complaint: Cough and congestion [COPD exacerbation]  History of Present Illness:    Alice [name] is a 56 y.o. female with Cough This is a recurrent problem. The current episode started 1 to 4 weeks ago. The problem has been unchanged. The problem occurs constantly. The cough is Productive of sputum. Associated symptoms include nasal congestion, postnasal drip, shortness of breath and wheezing. Pertinent negatives include no chest pain, chills, ear congestion, ear pain, fever, headaches or rash. The symptoms are aggravated by cold air and dust. She has tried prescription cough suppressant, a beta-agonist inhaler and oral steroids for the symptoms. The treatment provided mild relief. Her past medical history is significant for COPD.     The patient does not have symptoms concerning for COVID-19 infection (fever, chills, cough, or new shortness of breath).    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   Qualifier: Diagnosis of  By: Zachary George     Chronic low back pain    Cocaine abuse (HCC)    COPD (chronic obstructive pulmonary disease) (HCC)    Heart  murmur    HLD (hyperlipidemia)    Hypertension    Stroke (HCC)    x4    Past Surgical History:  Procedure Laterality Date   ABDOMINAL HYSTERECTOMY     abd?   BACK SURGERY     hardware in back   cyst removed from ovary     FRACTURE SURGERY     LUMBAR SPINE   removal of BB in finger  2008   SALIVARY GLAND SURGERY     tumor reomoved from stomach      Family History  Problem Relation Age of Onset   Hypertension Mother    Cancer Father    CVA Neg Hx     Social History   Socioeconomic History   Marital status: Widowed    Spouse name: Not on file   Number of children: Not on file   Years of education: Not on file   Highest education level: Not on file  Occupational History   Occupation: disabled  Tobacco Use   Smoking status: Every Day    Packs/day: 0.50    Years: 40.00    Total pack years: 20.00    Types: Cigarettes   Smokeless tobacco: Never  Vaping Use   Vaping Use: Never used  Substance and Sexual Activity   Alcohol use: Not Currently    Comment: today   Drug use: Not Currently    Types: Heroin, "Crack" cocaine, Oxycodone, Cocaine    Comment: last used 05/24/20   Sexual activity: Not Currently  Other Topics Concern   Not on file  Social History Narrative   Single, 2 children; disabled.    Social Determinants of Health   Financial Resource Strain: Low Risk  (07/28/2021)   Overall Financial Resource Strain (CARDIA)    Difficulty of Paying Living Expenses: Not hard at all  Food Insecurity: No Food Insecurity (07/28/2021)   Hunger Vital Sign    Worried About Running Out of Food in the Last Year: Never true    Ran Out of Food in the Last Year: Never true  Transportation Needs: No Transportation Needs (07/28/2021)   PRAPARE - Hydrologist (Medical): No    Lack of Transportation (Non-Medical): No  Physical Activity: Not on file  Stress: Not on file  Social Connections: Not on file  Intimate Partner Violence: Not on file     Outpatient Medications Prior to Visit  Medication Sig Dispense Refill   albuterol (PROVENTIL) (2.5 MG/3ML) 0.083% nebulizer solution Take 3 mLs (2.5 mg total) by nebulization every 6 (six) hours as needed for wheezing or shortness of breath. 150 mL 1   albuterol (VENTOLIN HFA) 108 (90 Base) MCG/ACT inhaler Inhale 2 puffs into the lungs every 6 (six) hours as needed for wheezing or shortness of breath. 18 g 2   aspirin EC 81 MG tablet Take 1 tablet (81 mg total) by mouth daily with breakfast. For Stroke Prevention 30 tablet 11   azithromycin (ZITHROMAX Z-PAK) 250 MG tablet Take 2 tablets (500 mg) PO today, then 1 tablet (250 mg) PO daily x4 days. 6 tablet 0   baclofen (LIORESAL) 10 MG tablet Take 1 tablet (10 mg total) by mouth 2 (two) times daily. 60 tablet 3   diclofenac Sodium (VOLTAREN) 1 % GEL Apply 2 g topically 4 (four) times daily. 50 g 1   doxycycline (VIBRAMYCIN) 100 MG capsule Take 1 capsule (100 mg total) by mouth 2 (two) times daily. 20 capsule 0   fluticasone (FLONASE) 50 MCG/ACT nasal spray Place 1 spray into both nostrils daily for 7 days. 15.8 mL 0   gabapentin (NEURONTIN) 100 MG capsule Take 100 mg by mouth 2 (two) times daily.     guaiFENesin (ROBITUSSIN) 100 MG/5ML liquid Take 5 mLs by mouth every 4 (four) hours as needed for cough or to loosen phlegm. 120 mL 0   lisinopril (ZESTRIL) 10 MG tablet Take 1 tablet (10 mg total) by mouth daily. 30 tablet 3   meclizine (ANTIVERT) 25 MG tablet Take 1 tablet (25 mg total) by mouth 3 (three) times daily as needed for dizziness. 12 tablet 0   nicotine (NICODERM CQ - DOSED IN MG/24 HOURS) 14 mg/24hr patch Place 1 patch (14 mg total) onto the skin daily. 28 patch 0   pantoprazole (PROTONIX) 40 MG tablet Take 1 tablet (40 mg total) by mouth daily. 30 tablet 3   predniSONE (DELTASONE) 10 MG tablet Take 1 tablet (10 mg total) by mouth daily with breakfast. 6 tablet 0   SUBOXONE 8-2 MG FILM Place 0.5 Film under the tongue every 6 (six)  hours.     umeclidinium-vilanterol (ANORO ELLIPTA) 62.5-25 MCG/ACT AEPB Inhale 1 puff into the lungs daily. For COPD 30 each 3   No facility-administered medications prior to visit.    Allergies:   Benadryl [diphenhydramine hcl], Codeine, and Tetracycline   Social History   Tobacco Use   Smoking status: Every Day    Packs/day: 0.50    Years: 40.00    Total pack years: 20.00    Types: Cigarettes  Smokeless tobacco: Never  Vaping Use   Vaping Use: Never used  Substance Use Topics   Alcohol use: Not Currently    Comment: today   Drug use: Not Currently    Types: Heroin, "Crack" cocaine, Oxycodone, Cocaine    Comment: last used 05/24/20     Review of Systems  Constitutional:  Positive for malaise/fatigue. Negative for chills and fever.  HENT:  Positive for postnasal drip. Negative for ear pain.   Respiratory:  Positive for cough, shortness of breath and wheezing.   Cardiovascular:  Negative for chest pain.  Skin:  Negative for rash.  Neurological:  Negative for headaches.  All other systems reviewed and are negative.    Labs/Other Tests and Data Reviewed:    Recent Labs: 05/31/2021: TSH 1.682 07/25/2021: ALT 8 09/27/2021: B Natriuretic Peptide 17.0; BUN 12; Creatinine, Ser 0.68; Hemoglobin 12.7; Magnesium 1.9; Platelets 209; Potassium 4.1; Sodium 139   Recent Lipid Panel Lab Results  Component Value Date/Time   CHOL 192 07/25/2021 06:10 AM   TRIG 93 07/25/2021 06:10 AM   HDL 43 07/25/2021 06:10 AM   CHOLHDL 4.5 07/25/2021 06:10 AM   LDLCALC 130 (H) 07/25/2021 06:10 AM    Wt Readings from Last 3 Encounters:  09/27/21 198 lb (89.8 kg)  08/28/21 193 lb (87.5 kg)  08/14/21 197 lb (89.4 kg)     Objective:    Vital Signs:  LMP 07/30/2011    Physical Exam Vitals and nursing note reviewed.  Constitutional:      Appearance: She is ill-appearing.  HENT:     Head: Normocephalic.  Neurological:     Mental Status: She is alert.   Unable to complete full assessment  due virtual visit.  ASSESSMENT & PLAN:   1. COPD with exacerbation (HCC) - umeclidinium-vilanterol (ANORO ELLIPTA) 62.5-25 MCG/ACT AEPB; Inhale 1 puff into the lungs daily. For COPD  Dispense: 30 each; Refill: 3    No orders of the defined types were placed in this encounter.    Meds ordered this encounter  Medications   umeclidinium-vilanterol (ANORO ELLIPTA) 62.5-25 MCG/ACT AEPB    Sig: Inhale 1 puff into the lungs daily. For COPD    Dispense:  30 each    Refill:  3    Order Specific Question:   Supervising Provider    Answer:   Mechele Claude [phone]    COVID-19 Education: The signs and symptoms of COVID-19 were discussed with the patient and how to seek care for testing (follow up with PCP or arrange E-visit). The importance of social distancing was discussed today.  Time:   Today, I have spent 15 minutes with the patient with telehealth technology discussing the above problems.    Follow Up:   in person  prn  Signed, Alice Drown, NP  10/09/2021 1:46 PM    [facility]

## 2021-10-09 NOTE — Assessment & Plan Note (Signed)
Patient's symptoms are not well controlled on current medication.  Patient presents with cough and congestion.  Completed antibiotics, prednisone and prescription cough medication with no resolution.  Went over all medication with patient.  Patient apparently is not using her Ellipta as prescribed and forgot that she even had it.  Advised patient to start medication and follow-up if symptoms are not resolved.  Patient verbalized understanding.  Refill sent to pharmacy.

## 2021-10-10 DIAGNOSIS — I63339 Cerebral infarction due to thrombosis of unspecified posterior cerebral artery: Secondary | ICD-10-CM | POA: Diagnosis not present

## 2021-10-11 DIAGNOSIS — I63339 Cerebral infarction due to thrombosis of unspecified posterior cerebral artery: Secondary | ICD-10-CM | POA: Diagnosis not present

## 2021-10-12 DIAGNOSIS — I63339 Cerebral infarction due to thrombosis of unspecified posterior cerebral artery: Secondary | ICD-10-CM | POA: Diagnosis not present

## 2021-10-13 DIAGNOSIS — I63339 Cerebral infarction due to thrombosis of unspecified posterior cerebral artery: Secondary | ICD-10-CM | POA: Diagnosis not present

## 2021-10-14 DIAGNOSIS — I63339 Cerebral infarction due to thrombosis of unspecified posterior cerebral artery: Secondary | ICD-10-CM | POA: Diagnosis not present

## 2021-10-15 ENCOUNTER — Ambulatory Visit: Payer: Medicaid Other | Attending: Family Medicine

## 2021-10-15 DIAGNOSIS — R262 Difficulty in walking, not elsewhere classified: Secondary | ICD-10-CM | POA: Insufficient documentation

## 2021-10-15 DIAGNOSIS — M6281 Muscle weakness (generalized): Secondary | ICD-10-CM | POA: Insufficient documentation

## 2021-10-15 DIAGNOSIS — I63339 Cerebral infarction due to thrombosis of unspecified posterior cerebral artery: Secondary | ICD-10-CM | POA: Diagnosis not present

## 2021-10-15 NOTE — Therapy (Signed)
OUTPATIENT PHYSICAL THERAPY TREATMENT NOTE   Patient Name: Alice [name] MRN: [id number] DOB:07-Feb-1965, 56 y.o., female Today's Date: 10/15/2021   REFERRING PROVIDER: Denton Brick, MD   PT End of Session - 10/15/21 1518     Visit Number 11    Number of Visits 12    Date for PT Re-Evaluation 10/09/21    PT Start Time 4098    PT Stop Time 1608    PT Time Calculation (min) 53 min    Equipment Utilized During Treatment Other (comment)   rollator   Activity Tolerance Patient tolerated treatment well    Behavior During Therapy [facility] for tasks assessed/performed             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   Qualifier: Diagnosis of  By: Delfino Lovett     Chronic low back pain    Cocaine abuse (HCC)    COPD (chronic obstructive pulmonary disease) ([place])    Heart murmur    HLD (hyperlipidemia)    Hypertension    Stroke ([place])    x4   Past Surgical History:  Procedure Laterality Date   ABDOMINAL HYSTERECTOMY     abd?   BACK SURGERY     hardware in back   cyst removed from ovary     FRACTURE SURGERY     LUMBAR SPINE   removal of BB in finger  2008   SALIVARY GLAND SURGERY     tumor reomoved from stomach     Patient Active Problem List   Diagnosis Date Noted   Vertigo 07/24/2021   Fall at home, initial encounter 07/24/2021   GERD (gastroesophageal reflux disease) 07/24/2021   TIA (transient ischemic attack) 05/31/2021   Full incontinence of feces 02/13/2021   Urinary incontinence 02/13/2021   Hospital discharge follow-up 12/12/2020   Depression, major, single episode, moderate ([place]) 10/22/2020   Chronic pain of right knee 11/01/2018   GAD (generalized anxiety disorder) 11/01/2018   Vitamin D deficiency 11/01/2018   Acute bacterial endocarditis 07/27/2018   Ataxia 09/08/2016   Dysarthria 09/08/2016   H/o Prior ischemic stroke ([place]) 09/03/2016   Chronic hepatitis C virus infection ([place]) 05/09/2016   History of stroke 05/09/2016   History of  substance abuse ([place]) 05/09/2016   Stroke-like symptoms 05/09/2016   Sinusitis 05/09/2016   Heart murmur 01/08/2016   IVDU (intravenous drug user) 01/08/2016   COPD with exacerbation ([place]) 10/31/2015   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   AKI (acute kidney injury) ([place]) 10/04/2015   Dyslipidemia 10/04/2015   HTN (hypertension) 10/04/2015   Discitis 10/04/2015   Severe recurrent major depression without psychotic features ([place]) 05/08/2015   Muscular deconditioning 05/24/2013   Postlaminectomy syndrome, lumbar region 12/04/2012   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   Adverse drug reaction 06/17/2012   Anemia 06/17/2012   Chronic low back pain 06/17/2012   DDD (degenerative disc disease), lumbosacral 06/17/2012   Depressive disorder 06/17/2012   Insomnia 06/17/2012   Pain in soft tissues of limb 06/17/2012   Myalgia and myositis 06/17/2012   Thoracic or lumbosacral neuritis or radiculitis 06/17/2012   Sacroiliitis ([place]) 06/17/2012   Opioid use disorder, severe, dependence ([place]) 07/30/2011    Class: Acute   Precordial pain 02/18/2010   TOBACCO ABUSE 12/05/2009   Anxiety state 12/05/2009   Cigarette nicotine dependence without complication 11/91/4782   Palpitations 12/05/2009    REFERRING DIAG: Stroke like symptoms  THERAPY DIAG:  Difficulty in walking, not elsewhere classified  Muscle  weakness (generalized)  Rationale for Evaluation and Treatment Rehabilitation  PERTINENT HISTORY: multiple CVA's, multiple lumbar surgery, chronic low back pain  PRECAUTIONS: Fall  SUBJECTIVE: Pt arrives for today's treatment session reporting increased pain in upper and lower back.   PAIN:  Are you having pain? Yes: NPRS scale: 8-9/10 Pain location: upper and lower back   TODAY'S TREATMENT:                                9/22  EXERCISE LOG  Exercise Repetitions and Resistance Comments  UBE 90 RPM x 10 min   Stationary bike L7, seat 6 x 10 min   [employer] 20  reps x 3 sec hold    Blank cell = exercise not performed today   Modalities  Date: 10/15/2021 Unattended Estim: Lumbar, IFC, 15 mins, Pain   PATIENT EDUCATION:   HOME EXERCISE:    PT Long Term Goals - 08/28/21 1123       PT LONG TERM GOAL #1   Title Patient will be independent with her HEP.    Time 6    Period Weeks    Status On-going    Target Date 07/27/21      PT LONG TERM GOAL #2   Title Patient will be able to improve her five time sit to stand to 13 seconds or less with upper extremity support for improved safety with transfers.    Time 6    Period Weeks    Status On-going    Target Date 07/27/21      PT LONG TERM GOAL #3   Title Patient will improve her gait speed to at least 0.6 m/s for improved household mobility.    Time 6    Period Weeks    Status On-going    Target Date 07/27/21              Plan - 07/31/21 1119     Clinical Impression Statement Pt arrives for today's treatment session reporting 8/10 upper back pain and 9/10 lower back pain.  Pt agreeable to minimal exercises today due to increased pain.  Normal responses to estim and MH noted upon removal.  Pt reported minimal change in pain upon completion of today's treatment session.   Personal Factors and Comorbidities Other;Time since onset of injury/illness/exacerbation;Transportation;Comorbidity 3+    Comorbidities HTN, history of multiple CVA's, COPD, hepititus C,    Examination-Activity Limitations Locomotion Level;Transfers;Stairs;Stand    Examination-Participation Restrictions Cleaning;Community Activity    Stability/Clinical Decision Making Evolving/Moderate complexity    Rehab Potential Fair    PT Frequency 2x / week    PT Duration 6 weeks    PT Treatment/Interventions ADLs/Self Care Home Management;Electrical Stimulation;Gait training;Stair training;Functional mobility training;Therapeutic activities;Therapeutic exercise;Balance training;Neuromuscular re-education;Patient/family  education    PT Next Visit Plan nustep, upper and lower extremity strengthening, gait training, postural stability    Consulted and Agree with Plan of Care Patient             Kathrynn Ducking, PTA 10/15/2021, 4:10 PM

## 2021-10-16 ENCOUNTER — Telehealth: Payer: Self-pay | Admitting: Nurse Practitioner

## 2021-10-16 ENCOUNTER — Other Ambulatory Visit: Payer: Self-pay | Admitting: Nurse Practitioner

## 2021-10-16 DIAGNOSIS — I63339 Cerebral infarction due to thrombosis of unspecified posterior cerebral artery: Secondary | ICD-10-CM | POA: Diagnosis not present

## 2021-10-16 DIAGNOSIS — J441 Chronic obstructive pulmonary disease with (acute) exacerbation: Secondary | ICD-10-CM

## 2021-10-16 NOTE — Telephone Encounter (Signed)
Please review and advise and route to pools

## 2021-10-16 NOTE — Telephone Encounter (Signed)
Referral completed

## 2021-10-17 DIAGNOSIS — I63339 Cerebral infarction due to thrombosis of unspecified posterior cerebral artery: Secondary | ICD-10-CM | POA: Diagnosis not present

## 2021-10-18 DIAGNOSIS — I63339 Cerebral infarction due to thrombosis of unspecified posterior cerebral artery: Secondary | ICD-10-CM | POA: Diagnosis not present

## 2021-10-19 DIAGNOSIS — I63339 Cerebral infarction due to thrombosis of unspecified posterior cerebral artery: Secondary | ICD-10-CM | POA: Diagnosis not present

## 2021-10-20 DIAGNOSIS — I63339 Cerebral infarction due to thrombosis of unspecified posterior cerebral artery: Secondary | ICD-10-CM | POA: Diagnosis not present

## 2021-10-21 DIAGNOSIS — I63339 Cerebral infarction due to thrombosis of unspecified posterior cerebral artery: Secondary | ICD-10-CM | POA: Diagnosis not present

## 2021-10-22 DIAGNOSIS — I63339 Cerebral infarction due to thrombosis of unspecified posterior cerebral artery: Secondary | ICD-10-CM | POA: Diagnosis not present

## 2021-10-22 DIAGNOSIS — R32 Unspecified urinary incontinence: Secondary | ICD-10-CM | POA: Diagnosis not present

## 2021-10-22 DIAGNOSIS — J449 Chronic obstructive pulmonary disease, unspecified: Secondary | ICD-10-CM | POA: Diagnosis not present

## 2021-10-22 DIAGNOSIS — I639 Cerebral infarction, unspecified: Secondary | ICD-10-CM | POA: Diagnosis not present

## 2021-10-23 ENCOUNTER — Ambulatory Visit: Payer: Medicaid Other

## 2021-10-23 DIAGNOSIS — R262 Difficulty in walking, not elsewhere classified: Secondary | ICD-10-CM

## 2021-10-23 DIAGNOSIS — I63339 Cerebral infarction due to thrombosis of unspecified posterior cerebral artery: Secondary | ICD-10-CM | POA: Diagnosis not present

## 2021-10-23 DIAGNOSIS — M6281 Muscle weakness (generalized): Secondary | ICD-10-CM

## 2021-10-23 NOTE — Therapy (Addendum)
OUTPATIENT PHYSICAL THERAPY TREATMENT NOTE   Patient Name: Alice [name] MRN: [id number] DOB:25-Jul-1965, 56 y.o., female Today's Date: 10/23/2021   REFERRING PROVIDER: Denton Brick, MD   PT End of Session - 10/23/21 1117     Visit Number 12    Number of Visits 12    Date for PT Re-Evaluation 10/09/21    PT Start Time 1115    PT Stop Time 1202    PT Time Calculation (min) 47 min    Equipment Utilized During Treatment Other (comment)   rollator   Activity Tolerance Patient tolerated treatment well    Behavior During Therapy [facility] for tasks assessed/performed             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   Qualifier: Diagnosis of  By: Delfino Lovett     Chronic low back pain    Cocaine abuse (HCC)    COPD (chronic obstructive pulmonary disease) ([place])    Heart murmur    HLD (hyperlipidemia)    Hypertension    Stroke ([place])    x4   Past Surgical History:  Procedure Laterality Date   ABDOMINAL HYSTERECTOMY     abd?   BACK SURGERY     hardware in back   cyst removed from ovary     FRACTURE SURGERY     LUMBAR SPINE   removal of BB in finger  2008   SALIVARY GLAND SURGERY     tumor reomoved from stomach     Patient Active Problem List   Diagnosis Date Noted   Vertigo 07/24/2021   Fall at home, initial encounter 07/24/2021   GERD (gastroesophageal reflux disease) 07/24/2021   TIA (transient ischemic attack) 05/31/2021   Full incontinence of feces 02/13/2021   Urinary incontinence 02/13/2021   Hospital discharge follow-up 12/12/2020   Depression, major, single episode, moderate ([place]) 10/22/2020   Chronic pain of right knee 11/01/2018   GAD (generalized anxiety disorder) 11/01/2018   Vitamin D deficiency 11/01/2018   Acute bacterial endocarditis 07/27/2018   Ataxia 09/08/2016   Dysarthria 09/08/2016   H/o Prior ischemic stroke ([place]) 09/03/2016   Chronic hepatitis C virus infection ([place]) 05/09/2016   History of stroke 05/09/2016   History  of substance abuse ([place]) 05/09/2016   Stroke-like symptoms 05/09/2016   Sinusitis 05/09/2016   Heart murmur 01/08/2016   IVDU (intravenous drug user) 01/08/2016   COPD with exacerbation ([place]) 10/31/2015   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   AKI (acute kidney injury) ([place]) 10/04/2015   Dyslipidemia 10/04/2015   HTN (hypertension) 10/04/2015   Discitis 10/04/2015   Severe recurrent major depression without psychotic features ([place]) 05/08/2015   Muscular deconditioning 05/24/2013   Postlaminectomy syndrome, lumbar region 12/04/2012   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   Adverse drug reaction 06/17/2012   Anemia 06/17/2012   Chronic low back pain 06/17/2012   DDD (degenerative disc disease), lumbosacral 06/17/2012   Depressive disorder 06/17/2012   Insomnia 06/17/2012   Pain in soft tissues of limb 06/17/2012   Myalgia and myositis 06/17/2012   Thoracic or lumbosacral neuritis or radiculitis 06/17/2012   Sacroiliitis ([place]) 06/17/2012   Opioid use disorder, severe, dependence ([place]) 07/30/2011    Class: Acute   Precordial pain 02/18/2010   TOBACCO ABUSE 12/05/2009   Anxiety state 12/05/2009   Cigarette nicotine dependence without complication 32/44/0102   Palpitations 12/05/2009    REFERRING DIAG: Stroke like symptoms  THERAPY DIAG:  Difficulty in walking, not elsewhere classified  Muscle  weakness (generalized)  Rationale for Evaluation and Treatment Rehabilitation  PERTINENT HISTORY: multiple CVA's, multiple lumbar surgery, chronic low back pain  PRECAUTIONS: Fall  SUBJECTIVE: Pt arrives for today's treatment session reporting increased pain in upper and lower back.   PAIN:  Are you having pain? Yes: NPRS scale: 7/10 Pain location: upper and lower back   TODAY'S TREATMENT:                                10/13  EXERCISE LOG  Exercise Repetitions and Resistance Comments  UBE 90 RPM x 10 min   Stationary bike L5, seat 6 x 10 min   [employer] 20  reps x 3 sec hold    Blank cell = exercise not performed today   Modalities  Date: 10/23/2021 Unattended Estim: Lumbar, IFC 80-150 Hz, 15 mins, Pain and Tone Hot Pack: Lumbar, 15 mins, Pain and Tone   PATIENT EDUCATION:   HOME EXERCISE:    PT Long Term Goals - 10/23/21 1123       PT LONG TERM GOAL #1   Title Patient will be independent with her HEP.    Time 6    Period Weeks    Status Achieved    Target Date 07/27/21      PT LONG TERM GOAL #2   Title Patient will be able to improve her five time sit to stand to 13 seconds or less with upper extremity support for improved safety with transfers.    Baseline 10/23/21:12.75 seconds    Time 6    Period Weeks    Status Achieved    Target Date 07/27/21      PT LONG TERM GOAL #3   Title Patient will improve her gait speed to at least 0.6 m/s for improved household mobility.    Baseline 10/23/21: .6 m/s    Time 6    Period Weeks    Status Achieved    Target Date 07/27/21              Plan - 07/31/21 1119     Clinical Impression Statement Pt arrives for today's treatment session reporting 8/10 low back pain.  Pt able to demonstrate increased safety and mobility today which is shown with her 5 STS time and gait speed.  Pt able to perform 5 STS in 12.75 seconds and demonstrate .61ms gait speed.  Pt has met all of her goals at this time.  Pt encouraged to call facility with any questions and concerns.  Pt ready for discharge at this time.    Personal Factors and Comorbidities Other;Time since onset of injury/illness/exacerbation;Transportation;Comorbidity 3+    Comorbidities HTN, history of multiple CVA's, COPD, hepititus C,    Examination-Activity Limitations Locomotion Level;Transfers;Stairs;Stand    Examination-Participation Restrictions Cleaning;Community Activity    Stability/Clinical Decision Making Evolving/Moderate complexity    Rehab Potential Fair    PT Frequency 2x / week    PT Duration 6 weeks    PT  Treatment/Interventions ADLs/Self Care Home Management;Electrical Stimulation;Gait training;Stair training;Functional mobility training;Therapeutic activities;Therapeutic exercise;Balance training;Neuromuscular re-education;Patient/family education    PT Next Visit Plan nustep, upper and lower extremity strengthening, gait training, postural stability    Consulted and Agree with Plan of Care Patient             JKathrynn Ducking PTA 10/23/2021, 12:11 PM   PHYSICAL THERAPY DISCHARGE SUMMARY  Visits from Start of Care: 12  Current functional  level related to goals / functional outcomes: Patient was able to meet all of her goals for skilled physical therapy.    Remaining deficits: Low back pain    Education / Equipment: HEP   Patient agrees to discharge. Patient goals were met. Patient is being discharged due to meeting the stated rehab goals.  Jacqulynn Cadet, PT, DPT

## 2021-10-24 DIAGNOSIS — I63339 Cerebral infarction due to thrombosis of unspecified posterior cerebral artery: Secondary | ICD-10-CM | POA: Diagnosis not present

## 2021-10-25 DIAGNOSIS — I63339 Cerebral infarction due to thrombosis of unspecified posterior cerebral artery: Secondary | ICD-10-CM | POA: Diagnosis not present

## 2021-10-26 DIAGNOSIS — I63339 Cerebral infarction due to thrombosis of unspecified posterior cerebral artery: Secondary | ICD-10-CM | POA: Diagnosis not present

## 2021-10-27 DIAGNOSIS — I63339 Cerebral infarction due to thrombosis of unspecified posterior cerebral artery: Secondary | ICD-10-CM | POA: Diagnosis not present

## 2021-10-28 DIAGNOSIS — I63339 Cerebral infarction due to thrombosis of unspecified posterior cerebral artery: Secondary | ICD-10-CM | POA: Diagnosis not present

## 2021-10-29 DIAGNOSIS — I63339 Cerebral infarction due to thrombosis of unspecified posterior cerebral artery: Secondary | ICD-10-CM | POA: Diagnosis not present

## 2021-10-30 DIAGNOSIS — I63339 Cerebral infarction due to thrombosis of unspecified posterior cerebral artery: Secondary | ICD-10-CM | POA: Diagnosis not present

## 2021-10-31 DIAGNOSIS — I63339 Cerebral infarction due to thrombosis of unspecified posterior cerebral artery: Secondary | ICD-10-CM | POA: Diagnosis not present

## 2021-10-31 IMAGING — DX DG CHEST 1V PORT
1 series · 1 of 1 positions shown · non-contrast
Comparison: 10/09/2019

CLINICAL DATA: Cough and shortness of breath.

EXAM:
PORTABLE CHEST 1 VIEW

[chest ap]
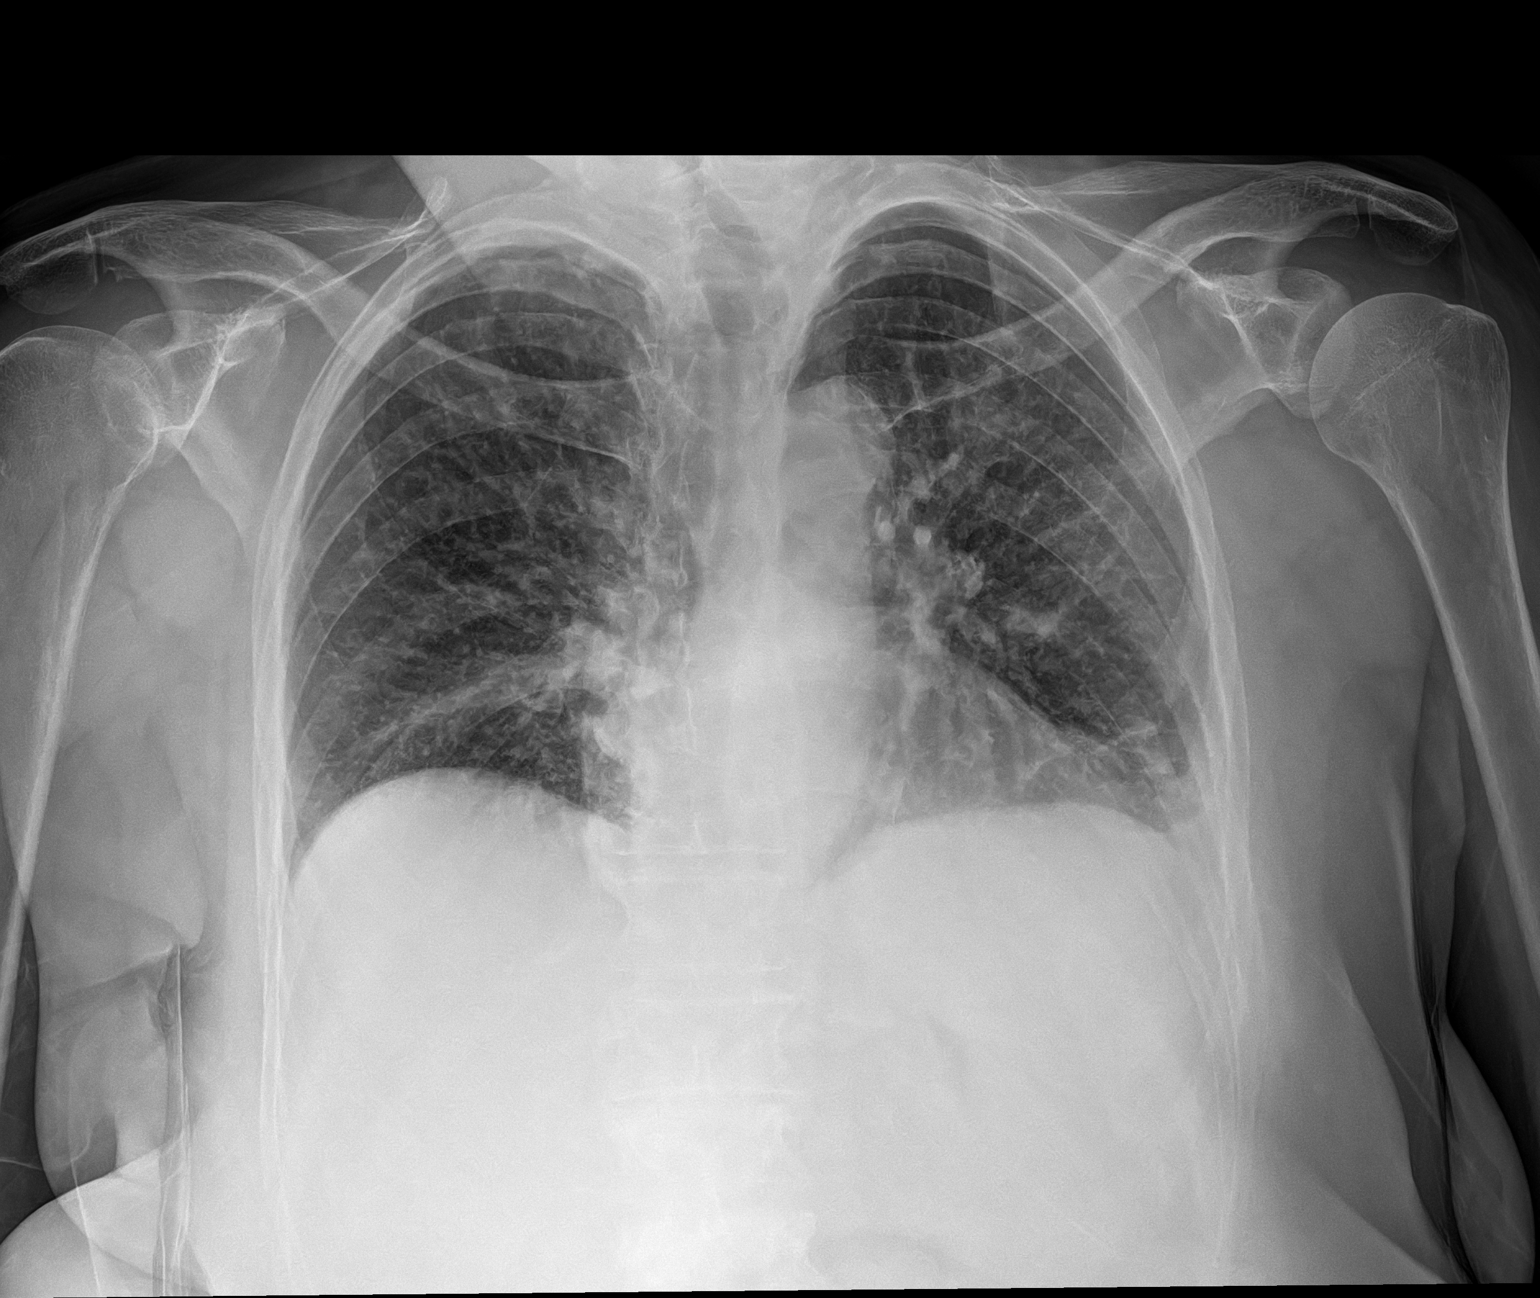

[1 of 1 positions shown; findings below may reference images not displayed]

FINDINGS: Again seen low lung volumes. Chronic blunting vol of costophrenic
angle scarring. No acute airspace disease. No pneumothorax or
pleural effusion. Chronic bronchial thickening. No acute osseous
abnormalities are seen.
IMPRESSION: Low lung volumes with chronic bronchial thickening. No superimposed
acute abnormality.

## 2021-11-01 DIAGNOSIS — I63339 Cerebral infarction due to thrombosis of unspecified posterior cerebral artery: Secondary | ICD-10-CM | POA: Diagnosis not present

## 2021-11-02 DIAGNOSIS — I63339 Cerebral infarction due to thrombosis of unspecified posterior cerebral artery: Secondary | ICD-10-CM | POA: Diagnosis not present

## 2021-11-03 DIAGNOSIS — I63339 Cerebral infarction due to thrombosis of unspecified posterior cerebral artery: Secondary | ICD-10-CM | POA: Diagnosis not present

## 2021-11-04 DIAGNOSIS — I63339 Cerebral infarction due to thrombosis of unspecified posterior cerebral artery: Secondary | ICD-10-CM | POA: Diagnosis not present

## 2021-11-05 DIAGNOSIS — I63339 Cerebral infarction due to thrombosis of unspecified posterior cerebral artery: Secondary | ICD-10-CM | POA: Diagnosis not present

## 2021-11-06 DIAGNOSIS — I63339 Cerebral infarction due to thrombosis of unspecified posterior cerebral artery: Secondary | ICD-10-CM | POA: Diagnosis not present

## 2021-11-07 DIAGNOSIS — I63339 Cerebral infarction due to thrombosis of unspecified posterior cerebral artery: Secondary | ICD-10-CM | POA: Diagnosis not present

## 2021-11-08 DIAGNOSIS — I63339 Cerebral infarction due to thrombosis of unspecified posterior cerebral artery: Secondary | ICD-10-CM | POA: Diagnosis not present

## 2021-11-09 DIAGNOSIS — I63339 Cerebral infarction due to thrombosis of unspecified posterior cerebral artery: Secondary | ICD-10-CM | POA: Diagnosis not present

## 2021-11-10 DIAGNOSIS — I63339 Cerebral infarction due to thrombosis of unspecified posterior cerebral artery: Secondary | ICD-10-CM | POA: Diagnosis not present

## 2021-11-11 DIAGNOSIS — I63339 Cerebral infarction due to thrombosis of unspecified posterior cerebral artery: Secondary | ICD-10-CM | POA: Diagnosis not present

## 2021-11-12 DIAGNOSIS — I63339 Cerebral infarction due to thrombosis of unspecified posterior cerebral artery: Secondary | ICD-10-CM | POA: Diagnosis not present

## 2021-11-13 DIAGNOSIS — I63339 Cerebral infarction due to thrombosis of unspecified posterior cerebral artery: Secondary | ICD-10-CM | POA: Diagnosis not present

## 2021-11-14 DIAGNOSIS — I63339 Cerebral infarction due to thrombosis of unspecified posterior cerebral artery: Secondary | ICD-10-CM | POA: Diagnosis not present

## 2021-11-15 DIAGNOSIS — I63339 Cerebral infarction due to thrombosis of unspecified posterior cerebral artery: Secondary | ICD-10-CM | POA: Diagnosis not present

## 2021-11-16 DIAGNOSIS — I63339 Cerebral infarction due to thrombosis of unspecified posterior cerebral artery: Secondary | ICD-10-CM | POA: Diagnosis not present

## 2021-11-17 DIAGNOSIS — I63339 Cerebral infarction due to thrombosis of unspecified posterior cerebral artery: Secondary | ICD-10-CM | POA: Diagnosis not present

## 2021-11-18 DIAGNOSIS — I63339 Cerebral infarction due to thrombosis of unspecified posterior cerebral artery: Secondary | ICD-10-CM | POA: Diagnosis not present

## 2021-11-19 DIAGNOSIS — I63339 Cerebral infarction due to thrombosis of unspecified posterior cerebral artery: Secondary | ICD-10-CM | POA: Diagnosis not present

## 2021-11-21 DIAGNOSIS — I63339 Cerebral infarction due to thrombosis of unspecified posterior cerebral artery: Secondary | ICD-10-CM | POA: Diagnosis not present

## 2021-11-22 DIAGNOSIS — I63339 Cerebral infarction due to thrombosis of unspecified posterior cerebral artery: Secondary | ICD-10-CM | POA: Diagnosis not present

## 2021-11-23 DIAGNOSIS — I63339 Cerebral infarction due to thrombosis of unspecified posterior cerebral artery: Secondary | ICD-10-CM | POA: Diagnosis not present

## 2021-11-24 DIAGNOSIS — I63339 Cerebral infarction due to thrombosis of unspecified posterior cerebral artery: Secondary | ICD-10-CM | POA: Diagnosis not present

## 2021-11-25 DIAGNOSIS — I63339 Cerebral infarction due to thrombosis of unspecified posterior cerebral artery: Secondary | ICD-10-CM | POA: Diagnosis not present

## 2021-11-26 DIAGNOSIS — I63339 Cerebral infarction due to thrombosis of unspecified posterior cerebral artery: Secondary | ICD-10-CM | POA: Diagnosis not present

## 2021-11-27 DIAGNOSIS — I63339 Cerebral infarction due to thrombosis of unspecified posterior cerebral artery: Secondary | ICD-10-CM | POA: Diagnosis not present

## 2021-11-28 DIAGNOSIS — I63339 Cerebral infarction due to thrombosis of unspecified posterior cerebral artery: Secondary | ICD-10-CM | POA: Diagnosis not present

## 2021-11-29 DIAGNOSIS — I63339 Cerebral infarction due to thrombosis of unspecified posterior cerebral artery: Secondary | ICD-10-CM | POA: Diagnosis not present

## 2021-11-30 DIAGNOSIS — I63339 Cerebral infarction due to thrombosis of unspecified posterior cerebral artery: Secondary | ICD-10-CM | POA: Diagnosis not present

## 2021-12-01 ENCOUNTER — Institutional Professional Consult (permissible substitution): Payer: Medicaid Other | Admitting: Internal Medicine

## 2021-12-01 DIAGNOSIS — I63339 Cerebral infarction due to thrombosis of unspecified posterior cerebral artery: Secondary | ICD-10-CM | POA: Diagnosis not present

## 2021-12-01 NOTE — Progress Notes (Deleted)
Alice [name], female    DOB: [birth year]    MRN: [id number]   Brief patient profile:  ***  yo*** *** referred to pulmonary clinic in [place]  12/01/2021 by *** for ***      History of Present Illness  12/01/2021  Pulmonary/ 1st office eval/ [facility] / [facility]  No chief complaint on file.    Dyspnea:  *** Cough: *** Sleep: *** SABA use: *** 02: *** Lung cancer screen: ***  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   Qualifier: Diagnosis of  By: Zachary George     Chronic low back pain    Cocaine abuse (HCC)    COPD (chronic obstructive pulmonary disease) (HCC)    Heart murmur    HLD (hyperlipidemia)    Hypertension    Stroke (HCC)    x4    Outpatient Medications Prior to Visit  Medication Sig Dispense Refill   albuterol (PROVENTIL) (2.5 MG/3ML) 0.083% nebulizer solution Take 3 mLs (2.5 mg total) by nebulization every 6 (six) hours as needed for wheezing or shortness of breath. 150 mL 1   albuterol (VENTOLIN HFA) 108 (90 Base) MCG/ACT inhaler Inhale 2 puffs into the lungs every 6 (six) hours as needed for wheezing or shortness of breath. 18 g 2   aspirin EC 81 MG tablet Take 1 tablet (81 mg total) by mouth daily with breakfast. For Stroke Prevention 30 tablet 11   azithromycin (ZITHROMAX Z-PAK) 250 MG tablet Take 2 tablets (500 mg) PO today, then 1 tablet (250 mg) PO daily x4 days. 6 tablet 0   baclofen (LIORESAL) 10 MG tablet Take 1 tablet (10 mg total) by mouth 2 (two) times daily. 60 tablet 3   diclofenac Sodium (VOLTAREN) 1 % GEL Apply 2 g topically 4 (four) times daily. 50 g 1   doxycycline (VIBRAMYCIN) 100 MG capsule Take 1 capsule (100 mg total) by mouth 2 (two) times daily. 20 capsule 0   fluticasone (FLONASE) 50 MCG/ACT nasal spray Place 1 spray into both nostrils daily for 7 days. 15.8 mL 0   gabapentin (NEURONTIN) 100 MG capsule Take 100 mg by mouth 2 (two) times daily.     guaiFENesin (ROBITUSSIN) 100 MG/5ML liquid Take 5 mLs by mouth  every 4 (four) hours as needed for cough or to loosen phlegm. 120 mL 0   lisinopril (ZESTRIL) 10 MG tablet Take 1 tablet (10 mg total) by mouth daily. 30 tablet 3   meclizine (ANTIVERT) 25 MG tablet Take 1 tablet (25 mg total) by mouth 3 (three) times daily as needed for dizziness. 12 tablet 0   nicotine (NICODERM CQ - DOSED IN MG/24 HOURS) 14 mg/24hr patch Place 1 patch (14 mg total) onto the skin daily. 28 patch 0   pantoprazole (PROTONIX) 40 MG tablet Take 1 tablet (40 mg total) by mouth daily. 30 tablet 3   predniSONE (DELTASONE) 10 MG tablet Take 1 tablet (10 mg total) by mouth daily with breakfast. 6 tablet 0   SUBOXONE 8-2 MG FILM Place 0.5 Film under the tongue every 6 (six) hours.     umeclidinium-vilanterol (ANORO ELLIPTA) 62.5-25 MCG/ACT AEPB Inhale 1 puff into the lungs daily. For COPD 30 each 3   No facility-administered medications prior to visit.     Objective:     LMP 07/30/2011          Assessment   No problem-specific Assessment & Plan notes found for this encounter.     Sandrea Hughs,  MD 12/01/2021

## 2021-12-02 DIAGNOSIS — I63339 Cerebral infarction due to thrombosis of unspecified posterior cerebral artery: Secondary | ICD-10-CM | POA: Diagnosis not present

## 2021-12-04 DIAGNOSIS — I63339 Cerebral infarction due to thrombosis of unspecified posterior cerebral artery: Secondary | ICD-10-CM | POA: Diagnosis not present

## 2021-12-05 DIAGNOSIS — I63339 Cerebral infarction due to thrombosis of unspecified posterior cerebral artery: Secondary | ICD-10-CM | POA: Diagnosis not present

## 2021-12-06 DIAGNOSIS — I63339 Cerebral infarction due to thrombosis of unspecified posterior cerebral artery: Secondary | ICD-10-CM | POA: Diagnosis not present

## 2021-12-07 DIAGNOSIS — I63339 Cerebral infarction due to thrombosis of unspecified posterior cerebral artery: Secondary | ICD-10-CM | POA: Diagnosis not present

## 2021-12-08 DIAGNOSIS — I63339 Cerebral infarction due to thrombosis of unspecified posterior cerebral artery: Secondary | ICD-10-CM | POA: Diagnosis not present

## 2021-12-09 DIAGNOSIS — I63339 Cerebral infarction due to thrombosis of unspecified posterior cerebral artery: Secondary | ICD-10-CM | POA: Diagnosis not present

## 2021-12-10 DIAGNOSIS — I63339 Cerebral infarction due to thrombosis of unspecified posterior cerebral artery: Secondary | ICD-10-CM | POA: Diagnosis not present

## 2021-12-11 DIAGNOSIS — I63339 Cerebral infarction due to thrombosis of unspecified posterior cerebral artery: Secondary | ICD-10-CM | POA: Diagnosis not present

## 2021-12-12 DIAGNOSIS — I63339 Cerebral infarction due to thrombosis of unspecified posterior cerebral artery: Secondary | ICD-10-CM | POA: Diagnosis not present

## 2021-12-13 DIAGNOSIS — I63339 Cerebral infarction due to thrombosis of unspecified posterior cerebral artery: Secondary | ICD-10-CM | POA: Diagnosis not present

## 2021-12-14 DIAGNOSIS — I63339 Cerebral infarction due to thrombosis of unspecified posterior cerebral artery: Secondary | ICD-10-CM | POA: Diagnosis not present

## 2021-12-15 DIAGNOSIS — I63339 Cerebral infarction due to thrombosis of unspecified posterior cerebral artery: Secondary | ICD-10-CM | POA: Diagnosis not present

## 2021-12-16 DIAGNOSIS — I63339 Cerebral infarction due to thrombosis of unspecified posterior cerebral artery: Secondary | ICD-10-CM | POA: Diagnosis not present

## 2021-12-17 DIAGNOSIS — I63339 Cerebral infarction due to thrombosis of unspecified posterior cerebral artery: Secondary | ICD-10-CM | POA: Diagnosis not present

## 2021-12-18 DIAGNOSIS — I63339 Cerebral infarction due to thrombosis of unspecified posterior cerebral artery: Secondary | ICD-10-CM | POA: Diagnosis not present

## 2021-12-19 DIAGNOSIS — I63339 Cerebral infarction due to thrombosis of unspecified posterior cerebral artery: Secondary | ICD-10-CM | POA: Diagnosis not present

## 2021-12-20 DIAGNOSIS — I63339 Cerebral infarction due to thrombosis of unspecified posterior cerebral artery: Secondary | ICD-10-CM | POA: Diagnosis not present

## 2021-12-21 DIAGNOSIS — I63339 Cerebral infarction due to thrombosis of unspecified posterior cerebral artery: Secondary | ICD-10-CM | POA: Diagnosis not present

## 2021-12-22 DIAGNOSIS — I63339 Cerebral infarction due to thrombosis of unspecified posterior cerebral artery: Secondary | ICD-10-CM | POA: Diagnosis not present

## 2021-12-23 DIAGNOSIS — I63339 Cerebral infarction due to thrombosis of unspecified posterior cerebral artery: Secondary | ICD-10-CM | POA: Diagnosis not present

## 2021-12-24 DIAGNOSIS — I63339 Cerebral infarction due to thrombosis of unspecified posterior cerebral artery: Secondary | ICD-10-CM | POA: Diagnosis not present

## 2021-12-25 DIAGNOSIS — I63339 Cerebral infarction due to thrombosis of unspecified posterior cerebral artery: Secondary | ICD-10-CM | POA: Diagnosis not present

## 2021-12-26 DIAGNOSIS — I63339 Cerebral infarction due to thrombosis of unspecified posterior cerebral artery: Secondary | ICD-10-CM | POA: Diagnosis not present

## 2021-12-27 DIAGNOSIS — I63339 Cerebral infarction due to thrombosis of unspecified posterior cerebral artery: Secondary | ICD-10-CM | POA: Diagnosis not present

## 2021-12-28 DIAGNOSIS — I63339 Cerebral infarction due to thrombosis of unspecified posterior cerebral artery: Secondary | ICD-10-CM | POA: Diagnosis not present

## 2021-12-29 DIAGNOSIS — I63339 Cerebral infarction due to thrombosis of unspecified posterior cerebral artery: Secondary | ICD-10-CM | POA: Diagnosis not present

## 2021-12-29 DIAGNOSIS — J209 Acute bronchitis, unspecified: Secondary | ICD-10-CM | POA: Diagnosis not present

## 2021-12-30 DIAGNOSIS — I639 Cerebral infarction, unspecified: Secondary | ICD-10-CM | POA: Diagnosis not present

## 2021-12-30 DIAGNOSIS — J449 Chronic obstructive pulmonary disease, unspecified: Secondary | ICD-10-CM | POA: Diagnosis not present

## 2021-12-30 DIAGNOSIS — R32 Unspecified urinary incontinence: Secondary | ICD-10-CM | POA: Diagnosis not present

## 2021-12-30 DIAGNOSIS — I63339 Cerebral infarction due to thrombosis of unspecified posterior cerebral artery: Secondary | ICD-10-CM | POA: Diagnosis not present

## 2021-12-31 ENCOUNTER — Telehealth: Payer: Medicaid Other | Admitting: Family Medicine

## 2021-12-31 DIAGNOSIS — I63339 Cerebral infarction due to thrombosis of unspecified posterior cerebral artery: Secondary | ICD-10-CM | POA: Diagnosis not present

## 2022-01-01 DIAGNOSIS — I63339 Cerebral infarction due to thrombosis of unspecified posterior cerebral artery: Secondary | ICD-10-CM | POA: Diagnosis not present

## 2022-01-02 DIAGNOSIS — I63339 Cerebral infarction due to thrombosis of unspecified posterior cerebral artery: Secondary | ICD-10-CM | POA: Diagnosis not present

## 2022-01-03 DIAGNOSIS — I63339 Cerebral infarction due to thrombosis of unspecified posterior cerebral artery: Secondary | ICD-10-CM | POA: Diagnosis not present

## 2022-01-05 DIAGNOSIS — I63339 Cerebral infarction due to thrombosis of unspecified posterior cerebral artery: Secondary | ICD-10-CM | POA: Diagnosis not present

## 2022-01-06 DIAGNOSIS — I63339 Cerebral infarction due to thrombosis of unspecified posterior cerebral artery: Secondary | ICD-10-CM | POA: Diagnosis not present

## 2022-01-07 DIAGNOSIS — I63339 Cerebral infarction due to thrombosis of unspecified posterior cerebral artery: Secondary | ICD-10-CM | POA: Diagnosis not present

## 2022-01-08 DIAGNOSIS — I63339 Cerebral infarction due to thrombosis of unspecified posterior cerebral artery: Secondary | ICD-10-CM | POA: Diagnosis not present

## 2022-01-09 DIAGNOSIS — I63339 Cerebral infarction due to thrombosis of unspecified posterior cerebral artery: Secondary | ICD-10-CM | POA: Diagnosis not present

## 2022-01-12 DIAGNOSIS — I63339 Cerebral infarction due to thrombosis of unspecified posterior cerebral artery: Secondary | ICD-10-CM | POA: Diagnosis not present

## 2022-01-13 DIAGNOSIS — I63339 Cerebral infarction due to thrombosis of unspecified posterior cerebral artery: Secondary | ICD-10-CM | POA: Diagnosis not present

## 2022-01-14 ENCOUNTER — Ambulatory Visit (HOSPITAL_COMMUNITY)
Admission: RE | Admit: 2022-01-14 | Discharge: 2022-01-14 | Disposition: A | Discharge status: Home / Self Care | Payer: Medicaid Other | Source: Ambulatory Visit | Attending: Internal Medicine | Admitting: Internal Medicine

## 2022-01-14 ENCOUNTER — Ambulatory Visit (INDEPENDENT_AMBULATORY_CARE_PROVIDER_SITE_OTHER): Payer: Medicaid Other | Admitting: Internal Medicine

## 2022-01-14 ENCOUNTER — Encounter: Payer: Self-pay | Admitting: Internal Medicine

## 2022-01-14 VITALS — BP 138/82 | HR 70 | Temp 98.2°F | Ht 72.0 in | Wt 195.2 lb

## 2022-01-14 DIAGNOSIS — F17210 Nicotine dependence, cigarettes, uncomplicated: Secondary | ICD-10-CM

## 2022-01-14 DIAGNOSIS — J449 Chronic obstructive pulmonary disease, unspecified: Secondary | ICD-10-CM

## 2022-01-14 DIAGNOSIS — R059 Cough, unspecified: Secondary | ICD-10-CM | POA: Diagnosis not present

## 2022-01-14 DIAGNOSIS — R0781 Pleurodynia: Secondary | ICD-10-CM | POA: Diagnosis not present

## 2022-01-14 DIAGNOSIS — R058 Other specified cough: Secondary | ICD-10-CM

## 2022-01-14 DIAGNOSIS — I63339 Cerebral infarction due to thrombosis of unspecified posterior cerebral artery: Secondary | ICD-10-CM | POA: Diagnosis not present

## 2022-01-14 MED ORDER — FAMOTIDINE 20 MG PO TABS: ORAL_TABLET | ORAL | 11 refills | Status: DC

## 2022-01-14 MED ORDER — VALSARTAN 160 MG PO TABS: 160.0000 mg | ORAL_TABLET | Freq: Every day | ORAL | 11 refills | Status: DC

## 2022-01-14 MED ORDER — GABAPENTIN 100 MG PO CAPS: 100.0000 mg | ORAL_CAPSULE | Freq: Four times a day (QID) | ORAL | 2 refills | Status: AC

## 2022-01-14 NOTE — Progress Notes (Signed)
Alice [name], female    DOB: [birth year]    MRN: [id number]   Brief patient profile:  73  yowf  active smoker  referred to pulmonary clinic in [place]  01/14/2022 by [name]  for copd eval       History of Present Illness  01/14/2022  Pulmonary/ 1st office eval/ Alice [name] / [place] on Anoro /ACEi / low dose ppi  Chief Complaint  Patient presents with   Consult    SOB/ COPD   Dyspnea:  baseline (oct 2023) still needing neb 3 x daily  Still housekeeping/ doe across the house front door to kitchen ride scooter to shop / walks bent over walking   Cough: nonp productive day > noct (as long as sleeps at 45 degrees) even when better still coughing but much worse since Nov 2023 with bilateral sym lat cp just with coughing fits   Sleep: much worse when try to sleep flat so using recliner x 45 degrees  SABA use: neb 3 x daily  02: none  Vax never vax/ once infected   No obvious patterns in  day to day or daytime pattern/variability or assoc excess/ purulent sputum or mucus plugs or hemoptysis  or chest tightness, subjective wheeze or overt sinus or hb symptoms.     Also denies any obvious fluctuation of symptoms with weather or environmental changes or other aggravating or alleviating factors except as outlined above   No unusual exposure hx or h/o childhood pna/ asthma or knowledge of premature birth.  Current Allergies, Complete Past Medical History, Past Surgical History, Family History, and Social History were reviewed in [employer] record.  ROS  The following are not active complaints unless bolded Hoarseness, sore throat, dysphagia/globus , dental problems, itching, sneezing,  nasal congestion or discharge of excess mucus or purulent secretions, ear ache,   fever, chills, sweats, unintended wt loss or wt gain, classically pleuritic or exertional cp,  orthopnea pnd or arm/hand swelling  or leg swelling, presyncope, palpitations, abdominal pain,  anorexia, nausea, vomiting, diarrhea  or change in bowel habits or change in bladder habits, change in stools or change in urine, dysuria, hematuria,  rash, arthralgias, visual complaints, headache, numbness, weakness or ataxia or problems with walking uses rollator or coordination,  change in mood or  memory.             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 150 mL 1   albuterol (VENTOLIN HFA) 108 (90 Base) MCG/ACT inhaler Inhale 2 puffs into the lungs every 6 (six) hours as needed for wheezing or shortness of breath. 18 g 2   aspirin EC 81 MG tablet Take 1 tablet (81 mg total) by mouth daily with breakfast. For Stroke Prevention 30 tablet 11   baclofen (LIORESAL) 10 MG tablet Take 1 tablet (10 mg total) by mouth 2 (two) times daily. 60 tablet 3  diclofenac Sodium (VOLTAREN) 1 % GEL Apply 2 g topically 4 (four) times daily. 50 g 1   gabapentin (NEURONTIN) 100 MG capsule Take 100 mg by mouth 2 (two) times daily.     guaiFENesin (ROBITUSSIN) 100 MG/5ML liquid Take 5 mLs by mouth every 4 (four) hours as needed for cough or to loosen phlegm. 120 mL 0   lisinopril (ZESTRIL) 10 MG tablet Take 1 tablet (10 mg total) by mouth daily. 30 tablet 3   nicotine (NICODERM CQ - DOSED IN MG/24 HOURS) 14 mg/24hr patch Place 1 patch (14 mg total) onto the skin daily. 28 patch 0   pantoprazole (PROTONIX) 40 MG tablet Take 1 tablet (40 mg total) by mouth daily. 30 tablet 3       0   SUBOXONE 8-2 MG FILM Place 0.5 Film  under the tongue every 6 (six) hours.     umeclidinium-vilanterol (ANORO ELLIPTA) 62.5-25 MCG/ACT AEPB Inhale 1 puff into the lungs daily. For COPD 30 each 3       0      0   fluticasone (FLONASE) 50 MCG/ACT nasal spray Place 1 spray into both nostrils daily for 7 days. 15.8 mL 0   meclizine (ANTIVERT) 25 MG tablet Take 1 tablet (25 mg total) by mouth 3 (three) times daily as needed for dizziness. 12 tablet 0      Objective:     BP 138/82   Pulse 70   Temp 98.2 F (36.8 C)   Ht 6' (1.829 m)   Wt 195 lb 3.2 oz (88.5 kg)   LMP 07/30/2011   SpO2 98% Comment: room air  BMI 26.47 kg/m   SpO2: 98 % (room air)  Amb wf harsh upper airway dry cough pattern    HEENT : Oropharynx  clear/ edentulous     NECK :  without  apparent JVD/ palpable Nodes/TM    LUNGS: no acc muscle use,  Min barrel  contour chest wall with bilateral  slightly decreased bs s audible wheeze and  without cough on insp or exp maneuvers and min  Hyperresonant  to  percussion bilaterally    CV:  RRR  no s3 or murmur or increase in P2, and no edema   ABD:  soft and nontender with pos end  insp Hoover's  in the supine position.  No bruits or organomegaly appreciated   MS:  Nl gait/ ext warm without deformities Or obvious joint restrictions  calf tenderness, cyanosis or clubbing     SKIN: warm and dry without lesions    NEURO:  alert, approp, nl sensorium with  no motor or cerebellar deficits apparent.            I personally reviewed images and agree with radiology impression as follows:   Chest CTa    07/18/21  1. No evidence of pulmonary embolus. 2. Small area of atelectasis versus airspace consolidation in the lateral aspect of the lingula. 3. Possible mild interstitial pulmonary edema. 4. Moderate in size hiatal hernia with diffuse dilation of the esophagus. 5. Chronic thoracic spine fractures.   CXR PA and Lateral:   01/14/2022 :    I personally reviewed images and impression is as follows:     Mod  kyphoscolisis/ hyperinflation/no acute changes    Assessment   Upper airway cough syndrome Onset Nov  2023 while on acei/ anoro/low dose ppi  - 01/14/2022 try off acei / max gerd rx and just use neb instead of anoro until cough  better  Upper airway cough syndrome (previously labeled PNDS),  is so named because it's frequently impossible to sort out how much is  CR/sinusitis with freq throat clearing (which can be related to primary GERD)   vs  causing  secondary (" extra esophageal")  GERD from wide swings in gastric pressure that occur with throat clearing, often  promoting self use of mint and menthol lozenges that reduce the lower esophageal sphincter tone and exacerbate the problem further in a cyclical fashion.   These are the same pts (now being labeled as having "irritable larynx syndrome" by some cough centers) who not infrequently have a history of having failed to tolerate ace inhibitors,  dry powder inhalers(like anoro)  or biphosphonates or report having atypical/extraesophageal reflux symptoms that don't respond to standard doses of PPI  and are easily confused as having aecopd or asthma flares by even experienced allergists/ pulmonologists (myself included).   >>> change acei to arb (see separate a/p)  >>> max gerd RX >>> hold anoro until cough better then try on again and if still cough change to stiolto   COPD  GOLD ? /  Group B still smoking Active smoker    Pt is Group B in terms of symptom/risk and laba/lama therefore appropriate rx at this point >>>  anoro good choice except in a pt with refractory cough so try just using neb qid prn until returns in 3-4 weeks and if better ok to rechallenge and if cough still an issue try change to stiolto or bevespi      Cigarette smoker Counseled re importance of smoking cessation but did not meet time criteria for separate billing           Each maintenance medication was reviewed in detail including emphasizing most importantly the  difference between maintenance and prns and under what circumstances the prns are to be triggered using an action plan format where appropriate.  Total time for H and P, chart review, counseling, reviewing dpi/hfa/neb  device(s) and generating customized AVS unique to this office visit / same day charting  > 45 min with pt new to me          Sandrea Hughs, MD 01/14/2022

## 2022-01-14 NOTE — Patient Instructions (Addendum)
Pantoprazole (protonix) 40 mg   Take  30-60 min before first meal of the day and add  Pepcid (famotidine)  20 mg after supper until return to office - this is the best way to tell whether stomach acid is contributing to your problem.    Stop all smoking  Stop lisinopil and anoro   Just use your nebulizer up every 4 hours as needed for cough or breathing problems   Diovan 160 (valsartan) one daily  -  ok to break in half if too strong  Gabapentin 100 mg increased to 4 x daily   For cough > mucinex dm 1200 mg every 12 hours and avoid mint menthol chocolate     Please remember to go to the  x-ray department  @  [facility] for your tests - we will call you with the results when they are available     Please schedule a follow up office visit in 3 weeks, sooner if needed  with all  inhalers/ solutions in hand so we can verify exactly what you are taking. This includes all medications from all doctors and over the counters

## 2022-01-15 ENCOUNTER — Encounter: Payer: Self-pay | Admitting: Internal Medicine

## 2022-01-15 DIAGNOSIS — J449 Chronic obstructive pulmonary disease, unspecified: Secondary | ICD-10-CM | POA: Insufficient documentation

## 2022-01-15 DIAGNOSIS — I63339 Cerebral infarction due to thrombosis of unspecified posterior cerebral artery: Secondary | ICD-10-CM | POA: Diagnosis not present

## 2022-01-15 NOTE — Assessment & Plan Note (Signed)
Counseled re importance of smoking cessation but did not meet time criteria for separate billing           Each maintenance medication was reviewed in detail including emphasizing most importantly the difference between maintenance and prns and under what circumstances the prns are to be triggered using an action plan format where appropriate.  Total time for H and P, chart review, counseling, reviewing dpi/hfa/neb  device(s) and generating customized AVS unique to this office visit / same day charting  > 45 min with pt new to me

## 2022-01-15 NOTE — Telephone Encounter (Signed)
Error

## 2022-01-15 NOTE — Assessment & Plan Note (Signed)
Active smoker    Pt is Group B in terms of symptom/risk and laba/lama therefore appropriate rx at this point >>>  anoro good choice except in a pt with refractory cough so try just using neb qid prn until returns in 3-4 weeks and if better ok to rechallenge and if cough still an issue try change to stiolto or bevespi

## 2022-01-15 NOTE — Assessment & Plan Note (Signed)
Onset Nov  2023 while on acei/ anoro/low dose ppi  - 01/14/2022 try off acei / max gerd rx and just use neb instead of anoro until cough better  Upper airway cough syndrome (previously labeled PNDS),  is so named because it's frequently impossible to sort out how much is  CR/sinusitis with freq throat clearing (which can be related to primary GERD)   vs  causing  secondary (" extra esophageal")  GERD from wide swings in gastric pressure that occur with throat clearing, often  promoting self use of mint and menthol lozenges that reduce the lower esophageal sphincter tone and exacerbate the problem further in a cyclical fashion.   These are the same pts (now being labeled as having "irritable larynx syndrome" by some cough centers) who not infrequently have a history of having failed to tolerate ace inhibitors,  dry powder inhalers(like anoro)  or biphosphonates or report having atypical/extraesophageal reflux symptoms that don't respond to standard doses of PPI  and are easily confused as having aecopd or asthma flares by even experienced allergists/ pulmonologists (myself included).   >>> change acei to arb (see separate a/p)  >>> max gerd RX >>> hold anoro until cough better then try on again and if still cough change to stiolto

## 2022-01-16 DIAGNOSIS — I63339 Cerebral infarction due to thrombosis of unspecified posterior cerebral artery: Secondary | ICD-10-CM | POA: Diagnosis not present

## 2022-01-17 ENCOUNTER — Encounter (HOSPITAL_COMMUNITY): Payer: Self-pay | Admitting: Neurology

## 2022-01-17 ENCOUNTER — Emergency Department (HOSPITAL_COMMUNITY): Payer: Medicaid Other

## 2022-01-17 ENCOUNTER — Inpatient Hospital Stay (HOSPITAL_COMMUNITY): Payer: Medicaid Other

## 2022-01-17 ENCOUNTER — Inpatient Hospital Stay (HOSPITAL_COMMUNITY)
Admission: EM | Admit: 2022-01-17 | Discharge: 2022-01-21 | DRG: 057 | Disposition: A | Discharge status: Home / Self Care | Payer: Medicaid Other | Attending: Neurology | Admitting: Neurology

## 2022-01-17 ENCOUNTER — Other Ambulatory Visit: Payer: Self-pay

## 2022-01-17 DIAGNOSIS — J9811 Atelectasis: Secondary | ICD-10-CM | POA: Diagnosis present

## 2022-01-17 DIAGNOSIS — I639 Cerebral infarction, unspecified: Secondary | ICD-10-CM | POA: Diagnosis not present

## 2022-01-17 DIAGNOSIS — F419 Anxiety disorder, unspecified: Secondary | ICD-10-CM | POA: Diagnosis present

## 2022-01-17 DIAGNOSIS — F1110 Opioid abuse, uncomplicated: Secondary | ICD-10-CM | POA: Diagnosis present

## 2022-01-17 DIAGNOSIS — F32A Depression, unspecified: Secondary | ICD-10-CM | POA: Diagnosis present

## 2022-01-17 DIAGNOSIS — R208 Other disturbances of skin sensation: Secondary | ICD-10-CM | POA: Diagnosis present

## 2022-01-17 DIAGNOSIS — R471 Dysarthria and anarthria: Secondary | ICD-10-CM | POA: Diagnosis not present

## 2022-01-17 DIAGNOSIS — Z7982 Long term (current) use of aspirin: Secondary | ICD-10-CM | POA: Diagnosis not present

## 2022-01-17 DIAGNOSIS — I69398 Other sequelae of cerebral infarction: Secondary | ICD-10-CM | POA: Diagnosis not present

## 2022-01-17 DIAGNOSIS — M545 Low back pain, unspecified: Secondary | ICD-10-CM | POA: Diagnosis present

## 2022-01-17 DIAGNOSIS — G8191 Hemiplegia, unspecified affecting right dominant side: Secondary | ICD-10-CM | POA: Diagnosis present

## 2022-01-17 DIAGNOSIS — I69351 Hemiplegia and hemiparesis following cerebral infarction affecting right dominant side: Principal | ICD-10-CM

## 2022-01-17 DIAGNOSIS — I10 Essential (primary) hypertension: Secondary | ICD-10-CM

## 2022-01-17 DIAGNOSIS — E785 Hyperlipidemia, unspecified: Secondary | ICD-10-CM | POA: Diagnosis present

## 2022-01-17 DIAGNOSIS — I69322 Dysarthria following cerebral infarction: Secondary | ICD-10-CM | POA: Diagnosis not present

## 2022-01-17 DIAGNOSIS — N390 Urinary tract infection, site not specified: Secondary | ICD-10-CM | POA: Diagnosis present

## 2022-01-17 DIAGNOSIS — R2990 Unspecified symptoms and signs involving the nervous system: Principal | ICD-10-CM

## 2022-01-17 DIAGNOSIS — Z79899 Other long term (current) drug therapy: Secondary | ICD-10-CM

## 2022-01-17 DIAGNOSIS — Z90710 Acquired absence of both cervix and uterus: Secondary | ICD-10-CM | POA: Diagnosis not present

## 2022-01-17 DIAGNOSIS — F17210 Nicotine dependence, cigarettes, uncomplicated: Secondary | ICD-10-CM | POA: Diagnosis present

## 2022-01-17 DIAGNOSIS — Z8249 Family history of ischemic heart disease and other diseases of the circulatory system: Secondary | ICD-10-CM | POA: Diagnosis not present

## 2022-01-17 DIAGNOSIS — E079 Disorder of thyroid, unspecified: Secondary | ICD-10-CM | POA: Diagnosis not present

## 2022-01-17 DIAGNOSIS — G8929 Other chronic pain: Secondary | ICD-10-CM | POA: Diagnosis present

## 2022-01-17 DIAGNOSIS — I69392 Facial weakness following cerebral infarction: Secondary | ICD-10-CM

## 2022-01-17 DIAGNOSIS — Z885 Allergy status to narcotic agent status: Secondary | ICD-10-CM

## 2022-01-17 DIAGNOSIS — Z881 Allergy status to other antibiotic agents status: Secondary | ICD-10-CM | POA: Diagnosis not present

## 2022-01-17 DIAGNOSIS — J449 Chronic obstructive pulmonary disease, unspecified: Secondary | ICD-10-CM | POA: Diagnosis present

## 2022-01-17 DIAGNOSIS — M47812 Spondylosis without myelopathy or radiculopathy, cervical region: Secondary | ICD-10-CM | POA: Diagnosis not present

## 2022-01-17 DIAGNOSIS — R29818 Other symptoms and signs involving the nervous system: Secondary | ICD-10-CM | POA: Diagnosis not present

## 2022-01-17 DIAGNOSIS — R29898 Other symptoms and signs involving the musculoskeletal system: Secondary | ICD-10-CM | POA: Diagnosis not present

## 2022-01-17 DIAGNOSIS — R531 Weakness: Secondary | ICD-10-CM

## 2022-01-17 DIAGNOSIS — I63339 Cerebral infarction due to thrombosis of unspecified posterior cerebral artery: Secondary | ICD-10-CM | POA: Diagnosis not present

## 2022-01-17 DIAGNOSIS — R4781 Slurred speech: Secondary | ICD-10-CM | POA: Diagnosis not present

## 2022-01-17 LAB — ECHOCARDIOGRAM COMPLETE
AR max vel: 2.84 cm2
AV Area VTI: 1.9 cm2
AV Area mean vel: 2 cm2
AV Mean grad: 3 mmHg
AV Peak grad: 5.3 mmHg
Ao pk vel: 1.15 m/s
Area-P 1/2: 1.76 cm2
Height: 72 in
MV VTI: 1.27 cm2
S' Lateral: 2.7 cm
Weight: 2804.25 oz

## 2022-01-17 LAB — COMPREHENSIVE METABOLIC PANEL
ALT: 11 U/L (ref 0–44)
AST: 16 U/L (ref 15–41)
Albumin: 4.1 g/dL (ref 3.5–5.0)
Alkaline Phosphatase: 80 U/L (ref 38–126)
Anion gap: 12 (ref 5–15)
BUN: 12 mg/dL (ref 6–20)
CO2: 23 mmol/L (ref 22–32)
Calcium: 9.1 mg/dL (ref 8.9–10.3)
Chloride: 100 mmol/L (ref 98–111)
Creatinine, Ser: 0.8 mg/dL (ref 0.44–1.00)
GFR, Estimated: >60 mL/min (ref >60)
Glucose, Bld: 121 mg/dL — ABNORMAL HIGH (ref 70–99)
Potassium: 3.8 mmol/L (ref 3.5–5.1)
Sodium: 135 mmol/L (ref 135–145)
Total Bilirubin: 0.7 mg/dL (ref 0.3–1.2)
Total Protein: 8 g/dL (ref 6.5–8.1)

## 2022-01-17 LAB — RESPIRATORY PANEL BY PCR

## 2022-01-17 LAB — DIFFERENTIAL
Abs Immature Granulocytes: 0.02 10*3/uL (ref 0.00–0.07)
Basophils Absolute: 0 10*3/uL (ref 0.0–0.1)
Basophils Relative: 0 %
Eosinophils Absolute: 0.1 10*3/uL (ref 0.0–0.5)
Eosinophils Relative: 1 %
Immature Granulocytes: 0 %
Lymphocytes Relative: 14 %
Lymphs Abs: 1 10*3/uL (ref 0.7–4.0)
Monocytes Absolute: 0.5 10*3/uL (ref 0.1–1.0)
Monocytes Relative: 7 %
Neutro Abs: 5.3 10*3/uL (ref 1.7–7.7)
Neutrophils Relative %: 78 %

## 2022-01-17 LAB — I-STAT CHEM 8, ED
BUN: 14 mg/dL (ref 6–20)
Calcium, Ion: 1.07 mmol/L — ABNORMAL LOW (ref 1.15–1.40)
Chloride: 103 mmol/L (ref 98–111)
Creatinine, Ser: 0.7 mg/dL (ref 0.44–1.00)
Glucose, Bld: 115 mg/dL — ABNORMAL HIGH (ref 70–99)
HCT: 40 % (ref 36.0–46.0)
Hemoglobin: 13.6 g/dL (ref 12.0–15.0)
Potassium: 4.5 mmol/L (ref 3.5–5.1)
Sodium: 139 mmol/L (ref 135–145)
TCO2: 27 mmol/L (ref 22–32)

## 2022-01-17 LAB — HIV ANTIBODY (ROUTINE TESTING W REFLEX): HIV Screen 4th Generation wRfx: NONREACTIVE

## 2022-01-17 LAB — CBC
HCT: 38.5 % (ref 36.0–46.0)
Hemoglobin: 13 g/dL (ref 12.0–15.0)
MCH: 30.5 pg (ref 26.0–34.0)
MCHC: 33.8 g/dL (ref 30.0–36.0)
MCV: 90.4 fL (ref 80.0–100.0)
Platelets: 180 10*3/uL (ref 150–400)
RBC: 4.26 MIL/uL (ref 3.87–5.11)
RDW: 12.3 % (ref 11.5–15.5)
WBC: 6.8 10*3/uL (ref 4.0–10.5)
nRBC: 0 % (ref 0.0–0.2)

## 2022-01-17 LAB — CBG MONITORING, ED: Glucose-Capillary: 125 mg/dL — ABNORMAL HIGH (ref 70–99)

## 2022-01-17 LAB — ETHANOL: Alcohol, Ethyl (B): <10 mg/dL (ref <10)

## 2022-01-17 LAB — PROTIME-INR
INR: 1.1 (ref 0.8–1.2)
Prothrombin Time: 13.7 seconds (ref 11.4–15.2)

## 2022-01-17 LAB — MRSA NEXT GEN BY PCR, NASAL: MRSA by PCR Next Gen: NOT DETECTED

## 2022-01-17 LAB — APTT: aPTT: 27 seconds (ref 24–36)

## 2022-01-17 MED ORDER — NICOTINE 7 MG/24HR TD PT24
7.0000 mg | MEDICATED_PATCH | Freq: Every day | TRANSDERMAL | Status: DC
  Administered 2022-01-17 – 2022-01-21 (×5): 7 mg via TRANSDERMAL
  Filled 2022-01-17 (×5): qty 1

## 2022-01-17 MED ORDER — STROKE: EARLY STAGES OF RECOVERY BOOK
Freq: Once | Status: AC
  Filled 2022-01-17: qty 1

## 2022-01-17 MED ORDER — TENECTEPLASE FOR STROKE
PACK | INTRAVENOUS | Status: AC
  Filled 2022-01-17: qty 10

## 2022-01-17 MED ORDER — IOHEXOL 350 MG/ML SOLN
75.0000 mL | Freq: Once | INTRAVENOUS | Status: AC | PRN
  Administered 2022-01-17: 75 mL via INTRAVENOUS

## 2022-01-17 MED ORDER — BACLOFEN 10 MG PO TABS
10.0000 mg | ORAL_TABLET | Freq: Two times a day (BID) | ORAL | Status: DC
  Administered 2022-01-17 – 2022-01-21 (×9): 10 mg via ORAL
  Filled 2022-01-17 (×10): qty 1

## 2022-01-17 MED ORDER — GABAPENTIN 100 MG PO CAPS
100.0000 mg | ORAL_CAPSULE | Freq: Two times a day (BID) | ORAL | Status: DC
  Administered 2022-01-17 – 2022-01-21 (×8): 100 mg via ORAL
  Filled 2022-01-17 (×8): qty 1

## 2022-01-17 MED ORDER — PANTOPRAZOLE SODIUM 40 MG IV SOLR
40.0000 mg | Freq: Every day | INTRAVENOUS | Status: DC
  Administered 2022-01-17 – 2022-01-18 (×2): 40 mg via INTRAVENOUS
  Filled 2022-01-17 (×2): qty 10

## 2022-01-17 MED ORDER — SODIUM CHLORIDE 0.9 % IV SOLN: INTRAVENOUS | Status: DC

## 2022-01-17 MED ORDER — CHLORHEXIDINE GLUCONATE CLOTH 2 % EX PADS
6.0000 | MEDICATED_PAD | Freq: Every day | CUTANEOUS | Status: DC
  Administered 2022-01-17 – 2022-01-21 (×5): 6 via TOPICAL

## 2022-01-17 MED ORDER — TENECTEPLASE FOR STROKE: 0.2500 mg/kg | PACK | Freq: Once | INTRAVENOUS | Status: AC

## 2022-01-17 MED ORDER — ACETAMINOPHEN 325 MG PO TABS
650.0000 mg | ORAL_TABLET | ORAL | Status: DC | PRN
  Administered 2022-01-18 – 2022-01-20 (×3): 650 mg via ORAL
  Filled 2022-01-17 (×3): qty 2

## 2022-01-17 MED ORDER — BUPRENORPHINE HCL-NALOXONE HCL 8-2 MG SL SUBL
0.5000 | SUBLINGUAL_TABLET | Freq: Every day | SUBLINGUAL | Status: DC
  Administered 2022-01-17: 0.5 via SUBLINGUAL
  Filled 2022-01-17: qty 1

## 2022-01-17 MED ORDER — ACETAMINOPHEN 160 MG/5ML PO SOLN: 650.0000 mg | ORAL | Status: DC | PRN

## 2022-01-17 MED ORDER — ALBUTEROL SULFATE (2.5 MG/3ML) 0.083% IN NEBU
2.5000 mg | INHALATION_SOLUTION | RESPIRATORY_TRACT | Status: DC | PRN
  Administered 2022-01-19: 2.5 mg via RESPIRATORY_TRACT
  Filled 2022-01-17: qty 3

## 2022-01-17 MED ORDER — SENNOSIDES-DOCUSATE SODIUM 8.6-50 MG PO TABS: 1.0000 | ORAL_TABLET | Freq: Every evening | ORAL | Status: DC | PRN

## 2022-01-17 MED ORDER — FAMOTIDINE 20 MG PO TABS
20.0000 mg | ORAL_TABLET | Freq: Every day | ORAL | Status: DC
  Administered 2022-01-17 – 2022-01-20 (×4): 20 mg via ORAL
  Filled 2022-01-17 (×4): qty 1

## 2022-01-17 MED ORDER — TENECTEPLASE FOR STROKE
PACK | INTRAVENOUS | Status: AC
  Administered 2022-01-17: 22 mg via INTRAVENOUS
  Filled 2022-01-17: qty 10

## 2022-01-17 MED ORDER — ORAL CARE MOUTH RINSE: 15.0000 mL | OROMUCOSAL | Status: DC | PRN

## 2022-01-17 MED ORDER — LABETALOL HCL 5 MG/ML IV SOLN: 5.0000 mg | INTRAVENOUS | Status: DC | PRN

## 2022-01-17 MED ORDER — ACETAMINOPHEN 650 MG RE SUPP: 650.0000 mg | RECTAL | Status: DC | PRN

## 2022-01-17 NOTE — Consult Note (Addendum)
Code stroke cart activated at [phone] (EMS pre alert) [facility] neuro paged at [phone] for EMS pre alert Pt arrived at [place] [address] [facility] [place]xe on screen at 0831 TNK order at 0900 TNK bolus of 22mg  pushed at 0900 MRS 3
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
## 2022-01-17 NOTE — Progress Notes (Signed)
PT Cancellation Note  Patient Details Name: Alice [name] MRN: [id number] DOB: 07-21-1965   Cancelled Treatment:    Reason Eval/Treat Not Completed: Active bedrest order. Will plan to follow-up once activity orders progress.   Moishe Spice, PT, DPT Acute Rehabilitation Services  Office: [place] 01/17/2022, 1:47 PM
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
## 2022-01-17 NOTE — ED Provider Notes (Signed)
[facility] EMERGENCY DEPARTMENT Provider Note   CSN: [id number] Arrival date & time: 01/17/22  4782  An emergency department physician performed an initial assessment on this suspected stroke patient at 0834.  History  Chief Complaint  Patient presents with   Code Stroke    Alice [name] is a 57 y.o. female.  HPI Patient presents for strokelike symptoms.  Medical history includes depression, HLD, HTN, COPD, opioid use disorder, anxiety, chronic pain, CVA, anemia.  She reports that her only ongoing deficit from prior strokes is some mild diminished sensation in her right leg.  She woke up this morning in her normal state of health.  She later noticed some right arm weakness.  Approximate time of onset was 7:15 AM.  Currently, she endorses slurred speech and ongoing right arm weakness.  EMS reports that she was able to ambulate.  CBG was in the range of 120.    Home Medications Prior to Admission medications   Medication Sig Start Date End Date Taking? Authorizing Provider  albuterol (PROVENTIL) (2.5 MG/3ML) 0.083% nebulizer solution Take 3 mLs (2.5 mg total) by nebulization every 6 (six) hours as needed for wheezing or shortness of breath. 09/22/21  Yes Gwenlyn Fudge, FNP  aspirin EC 81 MG tablet Take 1 tablet (81 mg total) by mouth daily with breakfast. For Stroke Prevention 07/25/21  Yes Emokpae, Courage, MD  baclofen (LIORESAL) 10 MG tablet Take 1 tablet (10 mg total) by mouth 2 (two) times daily. 07/25/21  Yes Emokpae, Courage, MD  famotidine (PEPCID) 20 MG tablet One after supper Patient taking differently: Take 20 mg by mouth daily. One after supper 01/14/22  Yes Nyoka Cowden, MD  gabapentin (NEURONTIN) 100 MG capsule Take 1 capsule (100 mg total) by mouth 4 (four) times daily. 01/14/22  Yes Nyoka Cowden, MD  pantoprazole (PROTONIX) 40 MG tablet Take 1 tablet (40 mg total) by mouth daily. 07/25/21  Yes Emokpae, Courage, MD  SUBOXONE 8-2 MG FILM Place 0.5 Film under the tongue every  6 (six) hours. 10/15/20  Yes [provider]  valsartan (DIOVAN) 160 MG tablet Take 1 tablet (160 mg total) by mouth daily. 01/14/22  Yes Nyoka Cowden, MD  albuterol (VENTOLIN HFA) 108 (90 Base) MCG/ACT inhaler Inhale 2 puffs into the lungs every 6 (six) hours as needed for wheezing or shortness of breath. 07/25/21   Shon Hale, MD  diclofenac Sodium (VOLTAREN) 1 % GEL Apply 2 g topically 4 (four) times daily. Patient not taking: Reported on 01/17/2022 08/28/21   Daryll Drown, NP      Allergies    Benadryl [diphenhydramine hcl], Codeine, and Tetracycline    Review of Systems   Review of Systems  Neurological:  Positive for speech difficulty and weakness.  All other systems reviewed and are negative.   Physical Exam Updated Vital Signs BP (!) 118/92   Pulse 65   Temp 98.7 F (37.1 C)   Resp 13   LMP 07/30/2011   SpO2 99%  Physical Exam Vitals and nursing note reviewed.  Constitutional:      General: She is not in acute distress.    Appearance: She is well-developed and normal weight. She is not toxic-appearing or diaphoretic.  HENT:     Head: Normocephalic and atraumatic.     Right Ear: External ear normal.     Left Ear: External ear normal.     Nose: Nose normal.     Mouth/Throat:     Mouth: Mucous membranes  are moist.  Eyes:     Extraocular Movements: Extraocular movements intact.     Conjunctiva/sclera: Conjunctivae normal.  Cardiovascular:     Rate and Rhythm: Normal rate and regular rhythm.  Pulmonary:     Effort: Pulmonary effort is normal. No respiratory distress.  Abdominal:     General: There is no distension.     Palpations: Abdomen is soft.     Tenderness: There is no abdominal tenderness.  Musculoskeletal:        General: No swelling. Normal range of motion.     Cervical back: Normal range of motion and neck supple.     Right lower leg: No edema.     Left lower leg: No edema.  Skin:    General: Skin is warm and dry.     Capillary  Refill: Capillary refill takes less than 2 seconds.     Coloration: Skin is not jaundiced or pale.  Neurological:     Mental Status: She is alert.     Cranial Nerves: Cranial nerve deficit and facial asymmetry present.     Sensory: Sensory deficit (RLE (chronic)) present.     Motor: Weakness and pronator drift (RUE) present.  Psychiatric:        Mood and Affect: Mood normal.     ED Results / Procedures / Treatments   Labs (all labs ordered are listed, but only abnormal results are displayed) Labs Reviewed  COMPREHENSIVE METABOLIC PANEL - Abnormal; Notable for the following components:      Result Value   Glucose, Bld 121 (*)    All other components within normal limits  CBG MONITORING, ED - Abnormal; Notable for the following components:   Glucose-Capillary 125 (*)    All other components within normal limits  I-STAT CHEM 8, ED - Abnormal; Notable for the following components:   Glucose, Bld 115 (*)    Calcium, Ion 1.07 (*)    All other components within normal limits  ETHANOL  PROTIME-INR  APTT  CBC  DIFFERENTIAL  RAPID URINE DRUG SCREEN, HOSP PERFORMED  URINALYSIS, ROUTINE W REFLEX MICROSCOPIC  POC URINE PREG, ED    EKG EKG Interpretation  Date/Time:  Sunday January 17 2022 09:00:03 EST Ventricular Rate:  70 PR Interval:  150 QRS Duration: 99 QT Interval:  379 QTC Calculation: 409 R Axis:   36 Text Interpretation: Sinus rhythm Abnormal R-wave progression, early transition Borderline T abnormalities, anterior leads Confirmed by Gloris Manchester [phone]) on 01/17/2022 10:32:22 AM  Radiology CT HEAD CODE STROKE WO CONTRAST  Result Date: 01/17/2022 CLINICAL DATA:  Code stroke. Slurred speech and right-sided weakness EXAM: CT HEAD WITHOUT CONTRAST TECHNIQUE: Contiguous axial images were obtained from the base of the skull through the vertex without intravenous contrast. RADIATION DOSE REDUCTION: This exam was performed according to the departmental dose-optimization program  which includes automated exposure control, adjustment of the mA and/or kV according to patient size and/or use of iterative reconstruction technique. COMPARISON:  07/24/2021 FINDINGS: Brain: No evidence of acute infarction, hemorrhage, hydrocephalus, extra-axial collection or mass lesion/mass effect. Chronic subcortical low-density in the left parietal region. Vascular: No hyperdense vessel or unexpected calcification. Skull: Normal. Negative for fracture or focal lesion. Sinuses/Orbits: No acute finding. Other: Prelim sent in epic chat ASPECTS [facility] Stroke Program Early CT Score) - Ganglionic level infarction (caudate, lentiform nuclei, internal capsule, insula, M1-M3 cortex): 7 - Supraganglionic infarction (M4-M6 cortex): 3 Total score (0-10 with 10 being normal): 10 IMPRESSION: 1. No acute or interval finding. 2. ASPECTS  is 10 Electronically Signed   By: Jorje Guild M.D.   On: 01/17/2022 08:56    Procedures Procedures    Medications Ordered in ED Medications  tenecteplase (TNKASE) 50 MG injection for Stroke (has no administration in time range)  tenecteplase (TNKASE) injection for Stroke 22 mg (22 mg Intravenous Given 01/17/22 0900)    ED Course/ Medical Decision Making/ A&P                           Medical Decision Making Amount and/or Complexity of Data Reviewed Labs: ordered. Radiology: ordered.  Risk Decision regarding hospitalization.   This patient presents to the ED for concern of strokelike symptoms, this involves an extensive number of treatment options, and is a complaint that carries with it a high risk of complications and morbidity.  The differential diagnosis includes CVA, recrudescence of prior CVA, TIA, complex migraine, seizure, hypoglycemia, intoxication   Co morbidities that complicate the patient evaluation  depression, HLD, HTN, COPD, opioid use disorder, anxiety, chronic pain, CVA, anemia   Additional history obtained:  Additional history obtained from  EMS External records from outside source obtained and reviewed including EMR   Lab Tests:  I Ordered, and personally interpreted labs.  The pertinent results include: Normal glucose, normal electrolytes, normal hemoglobin, no leukocytosis   Imaging Studies ordered:  I ordered imaging studies including CT head I independently visualized and interpreted imaging which showed no acute findings I agree with the radiologist interpretation   Cardiac Monitoring: / EKG:  The patient was maintained on a cardiac monitor.  I personally viewed and interpreted the cardiac monitored which showed an underlying rhythm of: Sinus rhythm   Consultations Obtained:  I requested consultation with the neurologist, Dr. Quinn Axe,  and discussed lab and imaging findings as well as pertinent plan - they recommend: TNKase and admission to neuro ICU   Problem List / ED Course / Critical interventions / Medication management  Patient with history of prior CVA, presents for new strokelike symptoms.  She reports that the only residual effects of her prior strokes is some mild right lower extremity diminished sensation.  This morning, she had acute onset of right arm weakness and slurred speech.  This occurred approximately 75 minutes prior to arrival.  Patient arrives by EMS.  EMS reports normal CBG.  On arrival, patient has facial droop, slurred speech, and pronator drift of right arm.  She denies any new areas of diminished sensation.  She is alert and oriented and maintaining her airway.  Code stroke was initiated.  Per chart review, patient is prescribed aspirin but no other blood thinning medications.  Patient was taken to [place].  CT imaging did not show any acute findings.  Patient was evaluated by neurology who gave TNKase.  Patient to be admitted to neuro ICU.   Social Determinants of Health:  Lives independently  CRITICAL CARE Performed by: Godfrey Pick   Total critical care time: 35  minutes  Critical care time was exclusive of separately billable procedures and treating other patients.  Critical care was necessary to treat or prevent imminent or life-threatening deterioration.  Critical care was time spent personally by me on the following activities: development of treatment plan with patient and/or surrogate as well as nursing, discussions with consultants, evaluation of patient's response to treatment, examination of patient, obtaining history from patient or surrogate, ordering and performing treatments and interventions, ordering and review of laboratory studies, ordering and review  of radiographic studies, pulse oximetry and re-evaluation of patient's condition.         Final Clinical Impression(s) / ED Diagnoses Final diagnoses:  Stroke-like symptoms    Rx / DC Orders ED Discharge Orders     None         Gloris Manchester, MD 01/17/22 [phone]

## 2022-01-17 NOTE — ED Notes (Signed)
[facility] notified of bed ready status.

## 2022-01-17 NOTE — ED Notes (Signed)
92mL saline flush given at 0900, TNK given at 0900, second 10 mL saline flush given at 0901

## 2022-01-17 NOTE — ED Notes (Signed)
0900: NS 34ml flush administered. 0900: TNK 22mg  administered. 0901: NS 8ml flush administered.

## 2022-01-17 NOTE — ED Notes (Signed)
CODE STROKE paged & CT notified.

## 2022-01-17 NOTE — Progress Notes (Signed)
  Echocardiogram 2D Echocardiogram has been performed.  Alice [name] 01/17/2022, 4:37 PM

## 2022-01-17 NOTE — ED Notes (Signed)
Patient back in room with lab at bedside at [place]. Neurology assessing patient at [phone].

## 2022-01-17 NOTE — ED Notes (Signed)
Pt passed stroke swallow screen  

## 2022-01-17 NOTE — ED Notes (Signed)
[facility] here to transport pt to [facility] ?

## 2022-01-17 NOTE — Progress Notes (Signed)
Code stroke time documentations  878 Call time [address] time 835 Exam started 842 Exam finished 842 Images sent to [facility] 845 Exam completed in [place] radiology called

## 2022-01-17 NOTE — ED Notes (Signed)
Telestroke notified.

## 2022-01-17 NOTE — ED Triage Notes (Signed)
Patient brought in via EMS from home. Arrival 0830.  Alert and oriented. Airway patent. Code Stroke called in field, encoded emergency traffic at [phone]. Code cart activated at 0823. Code stroke paged and CT notified at [phone]. Patient noted sudden have slurred speech at approx 0716 this morning along with increased numbness in right leg. Per patient hx of prior strokes x3. Blood glucose 125 per paramedic. Right arm drift, slurred speech, and right leg numbness noted. Patient assessed by Dr Doren Custard upon arrival, stroke cart at gurney side. Patient cleared for CT at 0834.

## 2022-01-18 ENCOUNTER — Inpatient Hospital Stay (HOSPITAL_COMMUNITY): Payer: Medicaid Other

## 2022-01-18 DIAGNOSIS — R2990 Unspecified symptoms and signs involving the nervous system: Secondary | ICD-10-CM | POA: Diagnosis not present

## 2022-01-18 DIAGNOSIS — I63339 Cerebral infarction due to thrombosis of unspecified posterior cerebral artery: Secondary | ICD-10-CM | POA: Diagnosis not present

## 2022-01-18 LAB — RAPID URINE DRUG SCREEN, HOSP PERFORMED
Amphetamines: NOT DETECTED
Barbiturates: NOT DETECTED
Benzodiazepines: NOT DETECTED
Cocaine: NOT DETECTED
Opiates: NOT DETECTED
Tetrahydrocannabinol: NOT DETECTED

## 2022-01-18 LAB — URINALYSIS, ROUTINE W REFLEX MICROSCOPIC
Bacteria, UA: NONE SEEN
Bilirubin Urine: NEGATIVE
Glucose, UA: NEGATIVE mg/dL
Hgb urine dipstick: NEGATIVE
Ketones, ur: NEGATIVE mg/dL
Nitrite: NEGATIVE
Protein, ur: NEGATIVE mg/dL
Specific Gravity, Urine: 1.023 (ref 1.005–1.030)
pH: 5 (ref 5.0–8.0)

## 2022-01-18 LAB — LIPID PANEL
Cholesterol: 194 mg/dL (ref 0–200)
HDL: 44 mg/dL (ref >40)
LDL Cholesterol: 129 mg/dL — ABNORMAL HIGH (ref 0–99)
Total CHOL/HDL Ratio: 4.4 RATIO
Triglycerides: 106 mg/dL (ref <150)
VLDL: 21 mg/dL (ref 0–40)

## 2022-01-18 LAB — HEMOGLOBIN A1C
Hgb A1c MFr Bld: 5.9 % — ABNORMAL HIGH (ref 4.8–5.6)
Mean Plasma Glucose: 123 mg/dL

## 2022-01-18 MED ORDER — ATORVASTATIN CALCIUM 40 MG PO TABS
40.0000 mg | ORAL_TABLET | Freq: Every day | ORAL | Status: DC
  Administered 2022-01-18 – 2022-01-21 (×4): 40 mg via ORAL
  Filled 2022-01-18 (×5): qty 1

## 2022-01-18 MED ORDER — BUPRENORPHINE HCL-NALOXONE HCL 8-2 MG SL SUBL
0.5000 | SUBLINGUAL_TABLET | Freq: Four times a day (QID) | SUBLINGUAL | Status: DC
  Administered 2022-01-18 – 2022-01-21 (×14): 0.5 via SUBLINGUAL
  Filled 2022-01-18 (×14): qty 1

## 2022-01-18 MED ORDER — LORAZEPAM 1 MG PO TABS
2.0000 mg | ORAL_TABLET | Freq: Once | ORAL | Status: AC
  Administered 2022-01-18: 2 mg via ORAL
  Filled 2022-01-18: qty 2

## 2022-01-18 MED ORDER — ASPIRIN 81 MG PO TBEC
81.0000 mg | DELAYED_RELEASE_TABLET | Freq: Every day | ORAL | Status: DC
  Administered 2022-01-18 – 2022-01-21 (×4): 81 mg via ORAL
  Filled 2022-01-18 (×4): qty 1

## 2022-01-18 NOTE — Progress Notes (Addendum)
STROKE TEAM PROGRESS NOTE   INTERVAL HISTORY No family at the bedside, no stroke on MRI.  She is reporting chest pain and cough and she is reporting worsening numbness in her right arm and leg. She says she doesn't feel right.  Resume ASA 81mg . Endorses smoking 2 cigarettes per day, reiterated importance of smoking cessation   Vitals:   01/18/22 0500 01/18/22 0600 01/18/22 0700 01/18/22 0800  BP: 104/77 94/67 105/75 108/75  Pulse: 73 63 72 72  Resp: 10 12 13    Temp:      TempSrc:      SpO2: 94% 93% 94% 97%  Weight:      Height:       CBC:  Recent Labs  Lab 01/17/22 0902 01/17/22 0903  WBC 6.8  --   NEUTROABS 5.3  --   HGB 13.0 13.6  HCT 38.5 40.0  MCV 90.4  --   PLT 180  --    Basic Metabolic Panel:  Recent Labs  Lab 01/17/22 0902 01/17/22 0903  NA 135 139  K 3.8 4.5  CL 100 103  CO2 23  --   GLUCOSE 121* 115*  BUN 12 14  CREATININE 0.80 0.70  CALCIUM 9.1  --    Lipid Panel:  Recent Labs  Lab 01/18/22 0511  CHOL 194  TRIG 106  HDL 44  CHOLHDL 4.4  VLDL 21  LDLCALC 161129*   HgbA1c: No results for input(s): "HGBA1C" in the last 168 hours. Urine Drug Screen: No results for input(s): "LABOPIA", "COCAINSCRNUR", "LABBENZ", "AMPHETMU", "THCU", "LABBARB" in the last 168 hours.  Alcohol Level  Recent Labs  Lab 01/17/22 0903  ETH <10    IMAGING past 24 hours CT ANGIO HEAD NECK W WO CM  Result Date: 01/17/2022 CLINICAL DATA:  Stroke suspected, slurred speech and right-sided weakness EXAM: CT ANGIOGRAPHY HEAD AND NECK TECHNIQUE: Multidetector CT imaging of the head and neck was performed using the standard protocol during bolus administration of intravenous contrast. Multiplanar CT image reconstructions and MIPs were obtained to evaluate the vascular anatomy. Carotid stenosis measurements (when applicable) are obtained utilizing NASCET criteria, using the distal internal carotid diameter as the denominator. RADIATION DOSE REDUCTION: This exam was performed  according to the departmental dose-optimization program which includes automated exposure control, adjustment of the mA and/or kV according to patient size and/or use of iterative reconstruction technique. CONTRAST:  75mL OMNIPAQUE IOHEXOL 350 MG/ML SOLN COMPARISON:  05/31/2021 CTA head neck, correlation is also made with CT head 01/17/2022 FINDINGS: CT HEAD FINDINGS Brain: No evidence of acute infarct, hemorrhage, mass, mass effect, or midline shift. No hydrocephalus or extra-axial fluid collection. Unchanged hypodensity in the left parietal lobe, which has been present on multiple exams. Vascular: No hyperdense vessel. Skull: Normal. Negative for fracture or focal lesion. Sinuses/Orbits: No acute finding. Other: The mastoid air cells are well aerated. CTA NECK FINDINGS Aortic arch: Standard branching. Imaged portion shows no evidence of aneurysm or dissection. No significant stenosis of the major arch vessel origins. Right carotid system: No evidence of dissection, occlusion, or hemodynamically significant stenosis (greater than 50%). Left carotid system: No evidence of dissection, occlusion, or hemodynamically significant stenosis (greater than 50%). Vertebral arteries: No evidence of dissection, occlusion, or hemodynamically significant stenosis (greater than 50%). Skeleton: No acute osseous abnormality. Degenerative changes in the cervical spine with osseous fusion across the C6-C7 disc space. A denture list. Other neck: 5 mm hypodense lesion in the left thyroid lobe, for which no follow-up is currently indicated. (  Reference: J Am Coll Radiol. 2015 Feb;12(2): 143-50). Otherwise negative. Upper chest: No focal pulmonary opacity or pleural effusion. Review of the MIP images confirms the above findings CTA HEAD FINDINGS Anterior circulation: Both internal carotid arteries are patent to the termini, without significant stenosis. A1 segments patent. Normal anterior communicating artery. Anterior cerebral arteries  are patent to their distal aspects. No M1 stenosis or occlusion. MCA branches perfused and symmetric. Posterior circulation: Vertebral arteries patent to the vertebrobasilar junction without stenosis. Posterior inferior cerebellar arteries patent proximally. Basilar patent to its distal aspect. Superior cerebellar arteries patent proximally. Patent P1 segments. PCAs perfused to their distal aspects without stenosis. The bilateral posterior communicating arteries are patent. Venous sinuses: As permitted by contrast timing, patent. Anatomic variants: None significant. Review of the MIP images confirms the above findings IMPRESSION: 1. No acute intracranial process. 2. No intracranial large vessel occlusion or significant stenosis. 3. No hemodynamically significant stenosis in the neck. Electronically Signed   By: Wiliam Ke M.D.   On: 01/17/2022 22:31   DG CHEST PORT 1 VIEW  Result Date: 01/17/2022 CLINICAL DATA:  Cough EXAM: PORTABLE CHEST 1 VIEW COMPARISON:  01/14/2022 FINDINGS: Stable cardiomediastinal silhouette. Aortic atherosclerotic calcification. Left basilar airspace opacities. No pleural effusion or pneumothorax. No acute osseous abnormality. IMPRESSION: Left basilar airspace opacities favored to represent atelectasis. Electronically Signed   By: Minerva Fester M.D.   On: 01/17/2022 19:27   ECHOCARDIOGRAM COMPLETE  Result Date: 01/17/2022    ECHOCARDIOGRAM REPORT   Patient Name:   Alice [name] Date of Exam: 01/17/2022 Medical Rec #:  [id number]   Height:       72.0 in Accession #:    [id number]  Weight:       175.3 lb Date of Birth:  1965-06-28   BSA:          2.014 m Patient Age:    56 years    BP:           109/89 mmHg Patient Gender: F           HR:           64 bpm. Exam Location:  Inpatient Procedure: 2D Echo, Cardiac Doppler and Color Doppler Indications:    Stroke  History:        Patient has prior history of Echocardiogram examinations, most                 recent 07/25/2021. COPD; Risk  Factors:Current Smoker,                 Hypertension and Dyslipidemia. Hx stroke. Hx polysubstance                 abuse.  Sonographer:    Ross Ludwig RDCS (AE) Referring Phys: Gordy Councilman  Sonographer Comments: Image acquisition challenging due to COPD. IMPRESSIONS  1. Left ventricular ejection fraction, by estimation, is 60 to 65%. The left ventricle has normal function. The left ventricle has no regional wall motion abnormalities. There is severe left ventricular hypertrophy. Left ventricular diastolic parameters  were normal.  2. Right ventricular systolic function was not well visualized. The right ventricular size is normal. Tricuspid regurgitation signal is inadequate for assessing PA pressure.  3. The mitral valve is grossly normal. No evidence of mitral valve regurgitation. No evidence of mitral stenosis.  4. The aortic valve was not well visualized. Aortic valve regurgitation is not visualized. No aortic stenosis is present.  5. The inferior vena cava is normal  in size with greater than 50% respiratory variability, suggesting right atrial pressure of 3 mmHg. Comparison(s): No significant change from prior study. FINDINGS  Left Ventricle: Left ventricular ejection fraction, by estimation, is 60 to 65%. The left ventricle has normal function. The left ventricle has no regional wall motion abnormalities. The left ventricular internal cavity size was normal in size. There is  severe left ventricular hypertrophy. Left ventricular diastolic parameters were normal. Right Ventricle: The right ventricular size is normal. No increase in right ventricular wall thickness. Right ventricular systolic function was not well visualized. Tricuspid regurgitation signal is inadequate for assessing PA pressure. Left Atrium: Left atrial size was normal in size. Right Atrium: Right atrial size was normal in size. Pericardium: Trivial pericardial effusion is present. Mitral Valve: The mitral valve is grossly normal. No  evidence of mitral valve regurgitation. No evidence of mitral valve stenosis. MV peak gradient, 4.9 mmHg. The mean mitral valve gradient is 1.0 mmHg. Tricuspid Valve: The tricuspid valve is not well visualized. Tricuspid valve regurgitation is not demonstrated. No evidence of tricuspid stenosis. Aortic Valve: The aortic valve was not well visualized. Aortic valve regurgitation is not visualized. No aortic stenosis is present. Aortic valve mean gradient measures 3.0 mmHg. Aortic valve peak gradient measures 5.3 mmHg. Aortic valve area, by VTI measures 1.90 cm. Pulmonic Valve: The pulmonic valve was not well visualized. Pulmonic valve regurgitation is trivial. No evidence of pulmonic stenosis. Aorta: The aortic root and ascending aorta are structurally normal, with no evidence of dilitation. Venous: The inferior vena cava is normal in size with greater than 50% respiratory variability, suggesting right atrial pressure of 3 mmHg. IAS/Shunts: The interatrial septum was not well visualized.  LEFT VENTRICLE PLAX 2D LVIDd:         4.00 cm   Diastology LVIDs:         2.70 cm   LV e' medial:    4.87 cm/s LV PW:         1.70 cm   LV E/e' medial:  17.6 LV IVS:        1.50 cm   LV e' lateral:   8.03 cm/s LVOT diam:     2.00 cm   LV E/e' lateral: 10.7 LV SV:         42 LV SV Index:   21 LVOT Area:     3.14 cm  RIGHT VENTRICLE             IVC RV Basal diam:  3.50 cm     IVC diam: 1.50 cm RV Mid diam:    3.50 cm RV S prime:     16.50 cm/s TAPSE (M-mode): 2.1 cm LEFT ATRIUM           Index        RIGHT ATRIUM           Index LA diam:      3.40 cm 1.69 cm/m   RA Area:     16.00 cm LA Vol (A2C): 27.1 ml 13.45 ml/m  RA Volume:   52.10 ml  25.86 ml/m LA Vol (A4C): 31.5 ml 15.64 ml/m  AORTIC VALVE AV Area (Vmax):    2.84 cm AV Area (Vmean):   2.00 cm AV Area (VTI):     1.90 cm AV Vmax:           115.00 cm/s AV Vmean:          73.400 cm/s AV VTI:  0.222 m AV Peak Grad:      5.3 mmHg AV Mean Grad:      3.0 mmHg LVOT  Vmax:         104.00 cm/s LVOT Vmean:        46.800 cm/s LVOT VTI:          0.134 m LVOT/AV VTI ratio: 0.60  AORTA Ao Root diam: 3.50 cm Ao Asc diam:  3.00 cm MITRAL VALVE MV Area (PHT): 1.76 cm    SHUNTS MV Area VTI:   1.27 cm    Systemic VTI:  0.13 m MV Peak grad:  4.9 mmHg    Systemic Diam: 2.00 cm MV Mean grad:  1.0 mmHg MV Vmax:       1.11 m/s MV Vmean:      52.3 cm/s MV Decel Time: 432 msec MV E velocity: 85.80 cm/s MV A velocity: 82.50 cm/s MV E/A ratio:  1.04 Vishnu Priya Mallipeddi Electronically signed by Winfield Rast Mallipeddi Signature Date/Time: 01/17/2022/5:32:55 PM    Final    CT HEAD CODE STROKE WO CONTRAST  Result Date: 01/17/2022 CLINICAL DATA:  Code stroke. Slurred speech and right-sided weakness EXAM: CT HEAD WITHOUT CONTRAST TECHNIQUE: Contiguous axial images were obtained from the base of the skull through the vertex without intravenous contrast. RADIATION DOSE REDUCTION: This exam was performed according to the departmental dose-optimization program which includes automated exposure control, adjustment of the mA and/or kV according to patient size and/or use of iterative reconstruction technique. COMPARISON:  07/24/2021 FINDINGS: Brain: No evidence of acute infarction, hemorrhage, hydrocephalus, extra-axial collection or mass lesion/mass effect. Chronic subcortical low-density in the left parietal region. Vascular: No hyperdense vessel or unexpected calcification. Skull: Normal. Negative for fracture or focal lesion. Sinuses/Orbits: No acute finding. Other: Prelim sent in epic chat ASPECTS [facility] Stroke Program Early CT Score) - Ganglionic level infarction (caudate, lentiform nuclei, internal capsule, insula, M1-M3 cortex): 7 - Supraganglionic infarction (M4-M6 cortex): 3 Total score (0-10 with 10 being normal): 10 IMPRESSION: 1. No acute or interval finding. 2. ASPECTS is 10 Electronically Signed   By: Tiburcio Pea M.D.   On: 01/17/2022 08:56    PHYSICAL EXAM Physical Exam   Constitutional: Appears well-developed and well-nourished.  Psych: Affect appropriate to situation, calm and cooperative, mildly anxious Eyes: No scleral injection HENT: No oropharyngeal obstruction.  MSK: no joint deformities.  Cardiovascular: Normal rate and regular rhythm. Perfusing extremities well Respiratory: Effort normal, non-labored breathing GI: Soft.  No distension. There is no tenderness.  Skin: Warm dry and intact visible skin   Neuro: Mental Status: Patient is awake, alert, oriented to person, place, month, year, and situation. Patient is able to give a clear and coherent history. No signs of aphasia or neglect Cranial Nerves: II: Visual Fields are full. Pupils are equal, round, and reactive to light.   III,IV, VI: EOMI without ptosis but she reports mild diplopia on left gaze V: Facial sensation is symmetric to light touch VII: Facial movement is symmetric.  VIII: hearing is intact to voice X: Uvula elevates symmetrically XI: Shoulder shrug is symmetric. XII: tongue is midline without atrophy or fasciculations.  Motor: Tone is normal. Bulk is normal. 5/5 strength was present in all four extremities  Sensory: She reports reduced sensation on the right arm and leg Cerebellar: FNF and HKS are intact bilaterally Gait:  Deferred in acute setting   ASSESSMENT/PLAN [name] is a 57 y.o. female with history of COPD, HLD, HTN, prior strokes and c/f functional  neurological presentations presenting with acute worsening of prior right sided symptoms this morning.    Recrudescence of previous stroke symptoms in the setting of ? Mild UTI   Code Stroke CT head No acute abnormality. ASPECTS 10.    CTA head & neck No acute intracranial process MRI  - no acute infarct 2D Echo  LDL 129 HgbA1c pending UDS negative VTE prophylaxis - SCDs aspirin 81 mg daily prior to admission, now on aspirin 81 mg daily.  Therapy recommendations:  SNF Disposition:  pending  History  of stroke 09/2015 admitted for left arm weakness and numbness.  CT no acute abnormality.  UDS positive for cocaine and opiates.  MRI showed right pontine infarct.  CTA head and neck unremarkable.  EF 65 to 70%.  LDL of 115, A1c 5.5.  Discharged on aspirin 81 and Lipitor 40 08/2016 admitted for slurred speech and left-sided numbness, difficulty walking.  CT negative.  Status post tPA.  CTA head and neck unremarkable.  MRI no acute infarct.  LDL 58, A1c 5.4.  UDS positive for cocaine again.  Discharged on aspirin 325 and Lipitor 20 10/2020 admitted again for right-sided weakness and slurred speech.  MRI no acute infarct but to old left parietal infarct.  CT head and neck negative.  EF 60 to 69%.  Dr. Gerilyn Pilgrim saw patient, concerning for MRI negative stroke versus Todd's paralysis.  Discharged on DAPT and Lipitor.  Also put on Keppra.  05/2021 admitted for worsening dysarthria and right hand clumsiness.  CT, CTA head and neck, MRI no acute finding.  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  LDL 112/5.6.  UDS negative.  Discharged on DAPT and Lipitor 80.  Patient stated that she had not been taking Keppra at home.  Seizure less likely, did not restart Keppra 07/2021 admitted for fall and dizziness with left lower extremity numbness.  MRI negative.  Hypertension Home meds:  Valsartan (Not taking) Stable BP goal < 180/105 Long-term BP goal normotensive  Hyperlipidemia LDL 129, goal < 70 Add Atorvastatin 40mg   Continue statin at discharge  Tobacco abuse Current smoker but has cutting down a lot Smoking cessation counseling provided Nicotine patch provided Pt is willing to quit  Other Stroke Risk Factors   Other Active Problems UTI UA WBC 6-10 Left basilar atelectasis  IS at the bedside   Hospital day # 1  Patient seen and examined by NP/APP with MD. MD to update note as needed.   , DNP, FNP-BC [facility] Pager: [phone]  ATTENDING NOTE: I  reviewed above note and agree with the assessment and plan. Pt was seen and examined.   57 year old female with history of hypertension, hyperlipidemia, COPD, smoker and history of stroke in 2017 admitted for right-sided weakness, numbness and slurred speech.  CT no acute abnormality.  Status post TNK.  CT head and neck unremarkable.  MRI no acute infarct.  EF 60 to 65%, LDL 129, A1c pending, UDS negative.  Creatinine 0.7.  BP still elevated.  On exam, no family at bedside.  Patient awake, alert, eyes open, orientated to age, place, time and people. No aphasia, fluent language, following all simple commands. Slight dysarthria. Able to name and repeat. No gaze palsy, tracking bilaterally, visual field full. No significant facial asymmetry. Tongue midline. Bilateral UEs 5/5, no drift. Bilaterally LEs 4/5, no drift. Sensation symmetrical bilaterally except decreased light touch sensation at right LE midway below the shin, b/l FTN intact, gait not tested.    Etiology  of patient's symptoms concerning for recrudescence versus conversion disorder.  Now 24 hours out of TNK, resume aspirin 81.  Add Lipitor 40.  Smoking cessation education provided.  Patient is willing to quit.  PT/OT recommend SNF.  For detailed assessment and plan, please refer to above/below as I have made changes wherever appropriate.   Rosalin Hawking, MD PhD Stroke Neurology 01/18/2022 4:01 PM  This patient is critically ill due to stroke symptoms status post TNK and at significant risk of neurological worsening, death form recurrent stroke, hemorrhagic conversion, bleeding from TNK. This patient's care requires constant monitoring of vital signs, hemodynamics, respiratory and cardiac monitoring, review of multiple databases, neurological assessment, discussion with family, other specialists and medical decision making of high complexity. I spent 35 minutes of neurocritical care time in the care of this patient.    To contact Stroke  Continuity provider, please refer to [email or web address] After hours, contact General Neurology

## 2022-01-18 NOTE — Evaluation (Signed)
Physical Therapy Evaluation Patient Details Name: Alice [name] MRN: [id number] DOB: 11/15/1965 Today's Date: 01/18/2022  History of Present Illness  Alice [name] is a 57 y.o. female amitted for questionable CVA affecting her right UE.  CT and MRI negative for acute CVA however pt with chronic left parietal CVA noted.  PMHx of COPD, HLD, HTN,   Clinical Impression  Pt sleepy from recent ativan given for MRI earlier this morning but able to answer questions appropriately and gave good effort. Pt presenting with weakness in R UE and LE and noted R LE ataxia when ambulating requiring use of RW and modA from PT for safe ambulation. Pt is alone during most of the day and would benefit from SNF upon d/c to achieve safe mod I level of function to minimize fall risk at home. Acute PT to cont to follow.       Recommendations for follow up therapy are one component of a multi-disciplinary discharge planning process, led by the attending physician.  Recommendations may be updated based on patient status, additional functional criteria and insurance authorization.  Follow Up Recommendations Skilled nursing-short term rehab (<3 hours/day) Can patient physically be transported by private vehicle: Yes    Assistance Recommended at Discharge Frequent or constant Supervision/Assistance  Patient can return home with the following  A lot of help with walking and/or transfers;A lot of help with bathing/dressing/bathroom;Assist for transportation;Help with stairs or ramp for entrance    Equipment Recommendations  (TBD at next venue)  Recommendations for Other Services       Functional Status Assessment Patient has had a recent decline in their functional status and demonstrates the ability to make significant improvements in function in a reasonable and predictable amount of time.     Precautions / Restrictions Precautions Precautions: Fall Restrictions Weight Bearing Restrictions: No      Mobility  Bed  Mobility Overal bed mobility: Needs Assistance Bed Mobility: Supine to Sit     Supine to sit: Supervision     General bed mobility comments: pt up in chair upon PT arrival    Transfers Overall transfer level: Needs assistance Equipment used: Rolling walker (2 wheels) Transfers: Sit to/from Stand Sit to Stand: Min assist   Step pivot transfers: Mod assist       General transfer comment: v/c's for safe hand placement to push up on arm rests and to reach back for arm rests, pt with poor eccentric control when sitting    Ambulation/Gait Ambulation/Gait assistance: Mod assist, +2 safety/equipment (2nd person for chair follow) Gait Distance (Feet): 50 Feet Assistive device: Rolling walker (2 wheels) Gait Pattern/deviations: Decreased stride length, Ataxic, Trunk flexed, Narrow base of support Gait velocity: dec Gait velocity interpretation: <1.31 ft/sec, indicative of household ambulator   General Gait Details: pt with significant trunk flexion which patients states "I've been like this for 3 years." Pt with noted R LE ataxia during ambulation stating "this leg won't do what it's suppose to." When pt asked to look forward pt was unable to sequence stepping with R LE due to impaired proprioception and co-ordination. Pt needs to look at her feet to attempt to sequence R LE properly. poor walker management pushing it out too far in front  Stairs            Wheelchair Mobility    Modified Rankin (Stroke Patients Only) Modified Rankin (Stroke Patients Only) Pre-Morbid Rankin Score: Moderate disability Modified Rankin: Moderate disability     Balance Overall balance assessment: Needs assistance  Sitting-balance support: Feet supported, No upper extremity supported Sitting balance-Leahy Scale: Fair     Standing balance support: During functional activity, No upper extremity supported Standing balance-Leahy Scale: Poor Standing balance comment: attempted to have pt march in  place without AD and pt unable with noted LOB requiring maxA to prevent fall                             Pertinent Vitals/Pain Pain Assessment Pain Assessment: 0-10 Faces Pain Scale: Hurts a little bit Pain Location: back pain Pain Intervention(s): Monitored during session    Home Living Family/patient expects to be discharged to:: Private residence Living Arrangements: Children (step son, pt's boyfriend is in prison and gets out on 02/06/22.) Available Help at Discharge: Available PRN/intermittently;Personal care attendant Type of Home: House Home Access: Stairs to enter Entrance Stairs-Rails:  (column but no rails) Entrance Stairs-Number of Steps: 2   Home Layout: One level Home Equipment: Grab bars - tub/shower;Grab bars - toilet;Hand held shower head;Rollator (4 wheels);Gilmer Mor - single point      Prior Function Prior Level of Function : Needs assist             Mobility Comments: Used the rollator outside of the house and cane in the house ADLs Comments: assist for IADLs, bathing and dressing; 2 hours/day M thru Friday and 2 hours Sat and Sun     Hand Dominance   Dominant Hand: Right    Extremity/Trunk Assessment   Upper Extremity Assessment Upper Extremity Assessment: Defer to OT evaluation RUE Deficits / Details: slightly limited LUE FM coordination with oppsition from finger to thumb for all digits compared to the LUE.  Also, needed multiple attempts to tie her gown when asked.    Lower Extremity Assessment Lower Extremity Assessment: RLE deficits/detail RLE Deficits / Details: grossly 4/5 compared to L LE, noted ataxia with gait pattern RLE Coordination: decreased gross motor    Cervical / Trunk Assessment Cervical / Trunk Assessment: Kyphotic (significant trunk flexion in standing, pt states "Ive been like this for 3 years")  Communication   Communication: No difficulties  Cognition Arousal/Alertness: Awake/alert Behavior During Therapy: WFL  for tasks assessed/performed Overall Cognitive Status: Within Functional Limits for tasks assessed                                          General Comments General comments (skin integrity, edema, etc.): VSS    Exercises     Assessment/Plan    PT Assessment Patient needs continued PT services  PT Problem List Decreased strength;Decreased activity tolerance;Decreased balance;Decreased mobility;Decreased coordination;Decreased cognition;Decreased knowledge of use of DME;Decreased safety awareness       PT Treatment Interventions DME instruction;Gait training;Stair training;Functional mobility training;Therapeutic activities;Therapeutic exercise;Balance training;Neuromuscular re-education;Cognitive remediation    PT Goals (Current goals can be found in the Care Plan section)  Acute Rehab PT Goals Patient Stated Goal: get stronger PT Goal Formulation: With patient Time For Goal Achievement: 02/01/22 Potential to Achieve Goals: Good    Frequency Min 3X/week     Co-evaluation               AM-PAC PT "6 Clicks" Mobility  Outcome Measure Help needed turning from your back to your side while in a flat bed without using bedrails?: A Little Help needed moving from lying on your back to sitting  on the side of a flat bed without using bedrails?: A Little Help needed moving to and from a bed to a chair (including a wheelchair)?: A Lot Help needed standing up from a chair using your arms (e.g., wheelchair or bedside chair)?: A Lot Help needed to walk in hospital room?: A Lot Help needed climbing 3-5 steps with a railing? : Total 6 Click Score: 13    End of Session Equipment Utilized During Treatment: Gait belt Activity Tolerance: Patient tolerated treatment well Patient left: in chair;with call bell/phone within reach;with chair alarm set Nurse Communication: Mobility status PT Visit Diagnosis: Unsteadiness on feet (R26.81);Muscle weakness (generalized)  (M62.81);Difficulty in walking, not elsewhere classified (R26.2)    Time: 1110-1130 PT Time Calculation (min) (ACUTE ONLY): 20 min   Charges:   PT Evaluation $PT Eval Moderate Complexity: 1 Mod          Kittie Plater, PT, DPT Acute Rehabilitation Services Secure chat preferred Office #: [phone]   Berline Lopes 01/18/2022, 2:38 PM

## 2022-01-18 NOTE — Progress Notes (Signed)
SLP Cancellation Note  Patient Details Name: Alice [name] MRN: [id number] DOB: 02/12/1965   Cancelled treatment:       Reason Eval/Treat Not Completed: Patient at procedure or test/unavailable  Pt currently off unit for MRI. Will continue efforts to complete cognitive/communication eval.  Rochelle Nephew B. Quentin Ore, [facility], [place] Speech Language Pathologist Office: [phone]  Shonna Chock 01/18/2022, 9:20 AM

## 2022-01-18 NOTE — Progress Notes (Signed)
SLP Cancellation Note  Patient Details Name: Alice [name] MRN: [id number] DOB: 05-18-1965   Cancelled treatment:       Reason Eval/Treat Not Completed: Patient at procedure or test/unavailable  Attempted to see pt after MRI, however, OT currently working with patient. Will continue efforts.   Jewelia Bocchino B. Quentin Ore, [facility], [place] Speech Language Pathologist Office: [phone]  Shonna Chock 01/18/2022, 10:36 AM
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
## 2022-01-18 NOTE — Evaluation (Signed)
Occupational Therapy Evaluation Patient Details Name: Alice [name] MRN: [id number] DOB: [birth year] Today's Date: 01/18/2022   History of Present Illness Alice [name] is a 57 y.o. female amitted for questionable CVA affecting her right UE.  CT and MRI negative for acute CVA however pt with chronic left parietal CVA noted.  PMHx of COPD, HLD, HTN,   Clinical Impression   Pt currently min assist for LB selfcare sit to stand with mod assist for stand pivot transfers and short distance mobility in simulation of toileting tasks.  She reports diplopia in the left near visual field  which was not present before admission.  Feel she will benefit from acute care OT at this time to help increase ADL independence.  Pt is alone for parts of the day as her aide is only available for 2 hrs and her step son has to work.  Feel she will benefit from follow-up SNF level therapy to get back to baseline prior to discharge back home.        Recommendations for follow up therapy are one component of a multi-disciplinary discharge planning process, led by the attending physician.  Recommendations may be updated based on patient status, additional functional criteria and insurance authorization.   Follow Up Recommendations  Skilled nursing-short term rehab (<3 hours/day)     Assistance Recommended at Discharge Frequent or constant Supervision/Assistance  Patient can return home with the following A little help with walking and/or transfers;A little help with bathing/dressing/bathroom;Assistance with cooking/housework;Assist for transportation;Help with stairs or ramp for entrance    Functional Status Assessment  Patient has had a recent decline in their functional status and demonstrates the ability to make significant improvements in function in a reasonable and predictable amount of time.  Equipment Recommendations  Tub/shower seat       Precautions / Restrictions Precautions Precautions:  Fall Restrictions Weight Bearing Restrictions: No      Mobility Bed Mobility Overal bed mobility: Needs Assistance Bed Mobility: Supine to Sit     Supine to sit: Supervision          Transfers Overall transfer level: Needs assistance Equipment used: None Transfers: Sit to/from Stand, Bed to chair/wheelchair/BSC Sit to Stand: Min assist     Step pivot transfers: Mod assist     General transfer comment: LOB to the right and posteriorly with standing and stepping.  Flexed trunk and head noted.      Balance Overall balance assessment: Needs assistance Sitting-balance support: Feet supported, No upper extremity supported Sitting balance-Leahy Scale: Fair     Standing balance support: During functional activity, No upper extremity supported Standing balance-Leahy Scale: Poor Standing balance comment: LOB posteriorly and to the right with mobility and standing                           ADL either performed or assessed with clinical judgement   ADL Overall ADL's : Needs assistance/impaired Eating/Feeding: Independent;Sitting   Grooming: Wash/dry hands;Wash/dry face;Set up;Sitting   Upper Body Bathing: Set up;Sitting Upper Body Bathing Details (indicate cue type and reason): simulated Lower Body Bathing: Minimal assistance;Sit to/from stand   Upper Body Dressing : Sitting;Minimal assistance Upper Body Dressing Details (indicate cue type and reason): hospital gown Lower Body Dressing: Sit to/from stand;Minimal assistance   Toilet Transfer: Moderate assistance;Ambulation Toilet Transfer Details (indicate cue type and reason): no assistive device, frequent LOB with transfer to the chair Toileting- Clothing Manipulation and Hygiene: Minimal assistance;Sit to/from stand  Functional mobility during ADLs: Moderate assistance (no device) General ADL Comments: Pt with good functional use of the RUE except for tying gown.  Reports slight difference in  coordination compared to baseline.  Frequent LOB to the right and posteriorly in standing.     Vision Baseline Vision/History: 1 Wears glasses Ability to See in Adequate Light: 0 Adequate Patient Visual Report: No change from baseline Vision Assessment?: Yes Eye Alignment: Within Functional Limits Ocular Range of Motion: Within Functional Limits Alignment/Gaze Preference: Within Defined Limits Tracking/Visual Pursuits: Able to track stimulus in all quads without difficulty Saccades: Within functional limits Convergence: Impaired (comment) (left eye with decreased convergence) Visual Fields: No apparent deficits Diplopia Assessment: Only with left gaze (reports diplopia with left gaze)     Perception  WFLs   Praxis Praxis Praxis-Other Comments: questionable motor planning when asked to tie her gown.  She had difficulty with pauses, trying to figure out what to do next.  Will continue to monitor in treatment.    Pertinent Vitals/Pain Pain Assessment Pain Assessment: Faces Faces Pain Scale: Hurts a little bit Pain Location: back pain Pain Intervention(s): Limited activity within patient's tolerance, Repositioned     Hand Dominance Right   Extremity/Trunk Assessment Upper Extremity Assessment Upper Extremity Assessment: RUE deficits/detail RUE Deficits / Details: slightly limited LUE FM coordination with oppsition from finger to thumb for all digits compared to the LUE.  Also, needed multiple attempts to tie her gown when asked.           Communication Communication Communication: No difficulties   Cognition Arousal/Alertness: Awake/alert Behavior During Therapy: WFL for tasks assessed/performed Overall Cognitive Status: Within Functional Limits for tasks assessed                                                  Home Living Family/patient expects to be discharged to:: Private residence Living Arrangements: Children (step son) Available Help at  Discharge: Available PRN/intermittently;Personal care attendant (step son works has [employer] that comes 2hs/7days a week) Type of Home: House Home Access: Stairs to enter [employer] of Steps: 2 Entrance Stairs-Rails:  (column but no rails) Home Layout: One level     Bathroom Shower/Tub: Teacher, early years/pre: Standard Bathroom Accessibility: Yes   Home Equipment: Grab bars - tub/shower;Grab bars - toilet;Hand held shower head;Rollator (4 wheels);Kasandra Knudsen - single point          Prior Functioning/Environment               Mobility Comments: Used the rollator outside of the house and cane in the house ADLs Comments: assist for IADLs, bathing and dressing; 2 hours/day M thru Friday and 2 hours Sat and Sun        OT Problem List: Impaired vision/perception;Impaired balance (sitting and/or standing);Pain;Impaired UE functional use;Decreased coordination;Decreased knowledge of use of DME or AE      OT Treatment/Interventions: Self-care/ADL training;Balance training;Therapeutic activities;DME and/or AE instruction;Cognitive remediation/compensation;Therapeutic exercise;Neuromuscular education;Patient/family education;Visual/perceptual remediation/compensation    OT Goals(Current goals can be found in the care plan section) Acute Rehab OT Goals Patient Stated Goal: Pt wants to get back to her normal but wants to go to rehab to get stronger OT Goal Formulation: With patient Time For Goal Achievement: 02/01/22 Potential to Achieve Goals: Good  OT Frequency: Min 2X/week       AM-PAC OT "  6 Clicks" Daily Activity     Outcome Measure Help from another person eating meals?: None Help from another person taking care of personal grooming?: A Little Help from another person toileting, which includes using toliet, bedpan, or urinal?: A Little Help from another person bathing (including washing, rinsing, drying)?: A Little Help from another person to put on and taking  off regular upper body clothing?: A Little Help from another person to put on and taking off regular lower body clothing?: A Lot 6 Click Score: 18   End of Session Equipment Utilized During Treatment: Gait belt Nurse Communication: Mobility status  Activity Tolerance: Patient tolerated treatment well Patient left: in bed;with call bell/phone within reach  OT Visit Diagnosis: Unsteadiness on feet (R26.81);Muscle weakness (generalized) (M62.81);Hemiplegia and hemiparesis Hemiplegia - Right/Left: Right Hemiplegia - caused by: Unspecified                Time: 2119-4174 OT Time Calculation (min): 33 min Charges:  OT General Charges $OT Visit: 1 Visit OT Evaluation $OT Eval Moderate Complexity: 1 Mod OT Treatments $Self Care/Home Management : 8-22 mins Joslynne Klatt OTR/L 01/18/2022, 12:48 PM

## 2022-01-19 DIAGNOSIS — R2990 Unspecified symptoms and signs involving the nervous system: Secondary | ICD-10-CM | POA: Diagnosis not present

## 2022-01-19 DIAGNOSIS — I63339 Cerebral infarction due to thrombosis of unspecified posterior cerebral artery: Secondary | ICD-10-CM | POA: Diagnosis not present

## 2022-01-19 MED ORDER — PANTOPRAZOLE SODIUM 40 MG PO TBEC
40.0000 mg | DELAYED_RELEASE_TABLET | Freq: Every day | ORAL | Status: DC
  Administered 2022-01-19 – 2022-01-21 (×3): 40 mg via ORAL
  Filled 2022-01-19 (×3): qty 1

## 2022-01-19 NOTE — Discharge Summary (Incomplete)
Stroke Discharge Summary  Patient ID: Alice [name]   MRN: [id number]      DOB: 1965/02/22  Date of Admission: 01/17/2022 Date of Discharge: 01/19/2022  Attending Physician:  Stroke, Md, MD, Stroke MD Consultant(s):    {consultation:[id number]} *** Patient's PCP:  Sonny Masters, FNP  DISCHARGE DIAGNOSIS: *** Principal Problem:   Stroke-like symptoms Active Problems:   Stroke-like symptom   Allergies as of 01/19/2022       Reactions   Benadryl [diphenhydramine Hcl] Hives   Codeine Rash   Tetracycline Itching, Rash     Med Rec must be completed prior to using this [facility]***       LABORATORY STUDIES CBC    Component Value Date/Time   WBC 6.8 01/17/2022 0902   RBC 4.26 01/17/2022 0902   HGB 13.6 01/17/2022 0903   HCT 40.0 01/17/2022 0903   PLT 180 01/17/2022 0902   MCV 90.4 01/17/2022 0902   MCH 30.5 01/17/2022 0902   MCHC 33.8 01/17/2022 0902   RDW 12.3 01/17/2022 0902   LYMPHSABS 1.0 01/17/2022 0902   MONOABS 0.5 01/17/2022 0902   EOSABS 0.1 01/17/2022 0902   BASOSABS 0.0 01/17/2022 0902   CMP    Component Value Date/Time   NA 139 01/17/2022 0903   K 4.5 01/17/2022 0903   CL 103 01/17/2022 0903   CO2 23 01/17/2022 0902   GLUCOSE 115 (H) 01/17/2022 0903   BUN 14 01/17/2022 0903   CREATININE 0.70 01/17/2022 0903   CALCIUM 9.1 01/17/2022 0902   PROT 8.0 01/17/2022 0902   ALBUMIN 4.1 01/17/2022 0902   AST 16 01/17/2022 0902   ALT 11 01/17/2022 0902   ALKPHOS 80 01/17/2022 0902   BILITOT 0.7 01/17/2022 0902   GFRNONAA >60 01/17/2022 0902   GFRAA >60 09/29/2016 1134   COAGS Lab Results  Component Value Date   INR 1.1 01/17/2022   INR 1.0 07/24/2021   INR 1.1 05/31/2021   Lipid Panel    Component Value Date/Time   CHOL 194 01/18/2022 0511   TRIG 106 01/18/2022 0511   HDL 44 01/18/2022 0511   CHOLHDL 4.4 01/18/2022 0511   VLDL 21 01/18/2022 0511   LDLCALC 129 (H) 01/18/2022 0511   HgbA1C  Lab Results  Component Value Date   HGBA1C 5.9 (H)  01/17/2022   Urinalysis    Component Value Date/Time   COLORURINE YELLOW 01/18/2022 1139   APPEARANCEUR CLEAR 01/18/2022 1139   LABSPEC 1.023 01/18/2022 1139   PHURINE 5.0 01/18/2022 1139   GLUCOSEU NEGATIVE 01/18/2022 1139   HGBUR NEGATIVE 01/18/2022 1139   BILIRUBINUR NEGATIVE 01/18/2022 1139   BILIRUBINUR neg 10/30/2013 1427   KETONESUR NEGATIVE 01/18/2022 1139   PROTEINUR NEGATIVE 01/18/2022 1139   UROBILINOGEN negative 10/30/2013 1427   UROBILINOGEN 1.0 07/31/2011 1059   NITRITE NEGATIVE 01/18/2022 1139   LEUKOCYTESUR MODERATE (A) 01/18/2022 1139   Urine Drug Screen     Component Value Date/Time   LABOPIA NONE DETECTED 01/18/2022 1139   COCAINSCRNUR NONE DETECTED 01/18/2022 1139   COCAINSCRNUR Negative 09/04/2016 0935   LABBENZ NONE DETECTED 01/18/2022 1139   AMPHETMU NONE DETECTED 01/18/2022 1139   THCU NONE DETECTED 01/18/2022 1139   LABBARB NONE DETECTED 01/18/2022 1139    Alcohol Level    Component Value Date/Time   ETH <10 01/17/2022 7564     SIGNIFICANT DIAGNOSTIC STUDIES MR BRAIN WO CONTRAST  Result Date: 01/18/2022 CLINICAL DATA:  57 year old female code stroke presentation yesterday with slurred speech and right-sided weakness.  Status post T NK. EXAM: MRI HEAD WITHOUT CONTRAST TECHNIQUE: Multiplanar, multiecho pulse sequences of the brain and surrounding structures were obtained without intravenous contrast. COMPARISON:  CT head, CTA head and neck yesterday. Previous MRI 07/24/2021. FINDINGS: Brain: Axial DWI was repeated due to motion artifact, but remains somewhat degraded. Better quality coronal DWI. No restricted diffusion or evidence of acute infarction. Stable cerebral volume. No midline shift, mass effect, evidence of mass lesion, ventriculomegaly, extra-axial collection or acute intracranial hemorrhage. Cervicomedullary junction and pituitary are within normal limits. Small area of chronic cortical encephalomalacia in the left parietal lobe is stable on  series 8, image 25. Mild associated hemosiderin (see SWI findings below). Nearby subcortical white matter probable perivascular space redemonstrated on series 8, image 20. SWI today (T2 * imaging previously) better demonstrates scattered areas of chronic microhemorrhage and hemosiderin in the brain including the left anterior corona radiata (series 9, image 56), right occipital pole (image 44), and some biapical superficial siderosis series 9, image 77. Only mild T2 heterogeneity in the bilateral basal ganglia is stable. Brainstem and cerebellum seem to remain negative. Vascular: Major intracranial vascular flow voids are stable since July. Distal left vertebral artery appears dominant. Skull and upper cervical spine: Stable and negative. Visualized bone marrow signal is within normal limits. Sinuses/Orbits: Stable and negative. Other: Mastoids remain well aerated. Grossly normal visible internal auditory structures. Negative visible scalp and face. IMPRESSION: 1. No acute intracranial abnormality. 2. Essentially stable noncontrast MRI appearance of the brain since July, although vertex Superficial Siderosis and other scattered chronic cerebral blood products are more apparent on SWI this time vs GRE before. Appearance is nonspecific but does not rise to the level of amyloid angiopathy. Small chronic left parietal lobe infarct again noted. Electronically Signed   By: Odessa Fleming M.D.   On: 01/18/2022 10:06   CT ANGIO HEAD NECK W WO CM  Result Date: 01/17/2022 CLINICAL DATA:  Stroke suspected, slurred speech and right-sided weakness EXAM: CT ANGIOGRAPHY HEAD AND NECK TECHNIQUE: Multidetector CT imaging of the head and neck was performed using the standard protocol during bolus administration of intravenous contrast. Multiplanar CT image reconstructions and MIPs were obtained to evaluate the vascular anatomy. Carotid stenosis measurements (when applicable) are obtained utilizing NASCET criteria, using the distal  internal carotid diameter as the denominator. RADIATION DOSE REDUCTION: This exam was performed according to the departmental dose-optimization program which includes automated exposure control, adjustment of the mA and/or kV according to patient size and/or use of iterative reconstruction technique. CONTRAST:  80mL OMNIPAQUE IOHEXOL 350 MG/ML SOLN COMPARISON:  05/31/2021 CTA head neck, correlation is also made with CT head 01/17/2022 FINDINGS: CT HEAD FINDINGS Brain: No evidence of acute infarct, hemorrhage, mass, mass effect, or midline shift. No hydrocephalus or extra-axial fluid collection. Unchanged hypodensity in the left parietal lobe, which has been present on multiple exams. Vascular: No hyperdense vessel. Skull: Normal. Negative for fracture or focal lesion. Sinuses/Orbits: No acute finding. Other: The mastoid air cells are well aerated. CTA NECK FINDINGS Aortic arch: Standard branching. Imaged portion shows no evidence of aneurysm or dissection. No significant stenosis of the major arch vessel origins. Right carotid system: No evidence of dissection, occlusion, or hemodynamically significant stenosis (greater than 50%). Left carotid system: No evidence of dissection, occlusion, or hemodynamically significant stenosis (greater than 50%). Vertebral arteries: No evidence of dissection, occlusion, or hemodynamically significant stenosis (greater than 50%). Skeleton: No acute osseous abnormality. Degenerative changes in the cervical spine with osseous fusion across the C6-C7  disc space. A denture list. Other neck: 5 mm hypodense lesion in the left thyroid lobe, for which no follow-up is currently indicated. (Reference: J Am Coll Radiol. 2015 Feb;12(2): 143-50). Otherwise negative. Upper chest: No focal pulmonary opacity or pleural effusion. Review of the MIP images confirms the above findings CTA HEAD FINDINGS Anterior circulation: Both internal carotid arteries are patent to the termini, without significant  stenosis. A1 segments patent. Normal anterior communicating artery. Anterior cerebral arteries are patent to their distal aspects. No M1 stenosis or occlusion. MCA branches perfused and symmetric. Posterior circulation: Vertebral arteries patent to the vertebrobasilar junction without stenosis. Posterior inferior cerebellar arteries patent proximally. Basilar patent to its distal aspect. Superior cerebellar arteries patent proximally. Patent P1 segments. PCAs perfused to their distal aspects without stenosis. The bilateral posterior communicating arteries are patent. Venous sinuses: As permitted by contrast timing, patent. Anatomic variants: None significant. Review of the MIP images confirms the above findings IMPRESSION: 1. No acute intracranial process. 2. No intracranial large vessel occlusion or significant stenosis. 3. No hemodynamically significant stenosis in the neck. Electronically Signed   By: Merilyn Baba M.D.   On: 01/17/2022 22:31   DG CHEST PORT 1 VIEW  Result Date: 01/17/2022 CLINICAL DATA:  Cough EXAM: PORTABLE CHEST 1 VIEW COMPARISON:  01/14/2022 FINDINGS: Stable cardiomediastinal silhouette. Aortic atherosclerotic calcification. Left basilar airspace opacities. No pleural effusion or pneumothorax. No acute osseous abnormality. IMPRESSION: Left basilar airspace opacities favored to represent atelectasis. Electronically Signed   By: Placido Sou M.D.   On: 01/17/2022 19:27   ECHOCARDIOGRAM COMPLETE  Result Date: 01/17/2022    ECHOCARDIOGRAM REPORT   Patient Name:   Azari Janssens Date of Exam: 01/17/2022 Medical Rec #:  [id number]   Height:       72.0 in Accession #:    [id number]  Weight:       175.3 lb Date of Birth:  01-23-1965   BSA:          2.014 m Patient Age:    37 years    BP:           109/89 mmHg Patient Gender: F           HR:           64 bpm. Exam Location:  Inpatient Procedure: 2D Echo, Cardiac Doppler and Color Doppler Indications:    Stroke  History:        Patient has prior  history of Echocardiogram examinations, most                 recent 07/25/2021. COPD; Risk Factors:Current Smoker,                 Hypertension and Dyslipidemia. Hx stroke. Hx polysubstance                 abuse.  Sonographer:    Clayton Lefort RDCS (AE) Referring Phys: Lorenza Chick  Sonographer Comments: Image acquisition challenging due to COPD. IMPRESSIONS  1. Left ventricular ejection fraction, by estimation, is 60 to 65%. The left ventricle has normal function. The left ventricle has no regional wall motion abnormalities. There is severe left ventricular hypertrophy. Left ventricular diastolic parameters  were normal.  2. Right ventricular systolic function was not well visualized. The right ventricular size is normal. Tricuspid regurgitation signal is inadequate for assessing PA pressure.  3. The mitral valve is grossly normal. No evidence of mitral valve regurgitation. No evidence of mitral stenosis.  4. The aortic valve  was not well visualized. Aortic valve regurgitation is not visualized. No aortic stenosis is present.  5. The inferior vena cava is normal in size with greater than 50% respiratory variability, suggesting right atrial pressure of 3 mmHg. Comparison(s): No significant change from prior study. FINDINGS  Left Ventricle: Left ventricular ejection fraction, by estimation, is 60 to 65%. The left ventricle has normal function. The left ventricle has no regional wall motion abnormalities. The left ventricular internal cavity size was normal in size. There is  severe left ventricular hypertrophy. Left ventricular diastolic parameters were normal. Right Ventricle: The right ventricular size is normal. No increase in right ventricular wall thickness. Right ventricular systolic function was not well visualized. Tricuspid regurgitation signal is inadequate for assessing PA pressure. Left Atrium: Left atrial size was normal in size. Right Atrium: Right atrial size was normal in size. Pericardium: Trivial  pericardial effusion is present. Mitral Valve: The mitral valve is grossly normal. No evidence of mitral valve regurgitation. No evidence of mitral valve stenosis. MV peak gradient, 4.9 mmHg. The mean mitral valve gradient is 1.0 mmHg. Tricuspid Valve: The tricuspid valve is not well visualized. Tricuspid valve regurgitation is not demonstrated. No evidence of tricuspid stenosis. Aortic Valve: The aortic valve was not well visualized. Aortic valve regurgitation is not visualized. No aortic stenosis is present. Aortic valve mean gradient measures 3.0 mmHg. Aortic valve peak gradient measures 5.3 mmHg. Aortic valve area, by VTI measures 1.90 cm. Pulmonic Valve: The pulmonic valve was not well visualized. Pulmonic valve regurgitation is trivial. No evidence of pulmonic stenosis. Aorta: The aortic root and ascending aorta are structurally normal, with no evidence of dilitation. Venous: The inferior vena cava is normal in size with greater than 50% respiratory variability, suggesting right atrial pressure of 3 mmHg. IAS/Shunts: The interatrial septum was not well visualized.  LEFT VENTRICLE PLAX 2D LVIDd:         4.00 cm   Diastology LVIDs:         2.70 cm   LV e' medial:    4.87 cm/s LV PW:         1.70 cm   LV E/e' medial:  17.6 LV IVS:        1.50 cm   LV e' lateral:   8.03 cm/s LVOT diam:     2.00 cm   LV E/e' lateral: 10.7 LV SV:         42 LV SV Index:   21 LVOT Area:     3.14 cm  RIGHT VENTRICLE             IVC RV Basal diam:  3.50 cm     IVC diam: 1.50 cm RV Mid diam:    3.50 cm RV S prime:     16.50 cm/s TAPSE (M-mode): 2.1 cm LEFT ATRIUM           Index        RIGHT ATRIUM           Index LA diam:      3.40 cm 1.69 cm/m   RA Area:     16.00 cm LA Vol (A2C): 27.1 ml 13.45 ml/m  RA Volume:   52.10 ml  25.86 ml/m LA Vol (A4C): 31.5 ml 15.64 ml/m  AORTIC VALVE AV Area (Vmax):    2.84 cm AV Area (Vmean):   2.00 cm AV Area (VTI):     1.90 cm AV Vmax:           115.00  cm/s AV Vmean:          73.400 cm/s AV  VTI:            0.222 m AV Peak Grad:      5.3 mmHg AV Mean Grad:      3.0 mmHg LVOT Vmax:         104.00 cm/s LVOT Vmean:        46.800 cm/s LVOT VTI:          0.134 m LVOT/AV VTI ratio: 0.60  AORTA Ao Root diam: 3.50 cm Ao Asc diam:  3.00 cm MITRAL VALVE MV Area (PHT): 1.76 cm    SHUNTS MV Area VTI:   1.27 cm    Systemic VTI:  0.13 m MV Peak grad:  4.9 mmHg    Systemic Diam: 2.00 cm MV Mean grad:  1.0 mmHg MV Vmax:       1.11 m/s MV Vmean:      52.3 cm/s MV Decel Time: 432 msec MV E velocity: 85.80 cm/s MV A velocity: 82.50 cm/s MV E/A ratio:  1.04 Vishnu Priya Mallipeddi Electronically signed by Winfield Rast Mallipeddi Signature Date/Time: 01/17/2022/5:32:55 PM    Final    CT HEAD CODE STROKE WO CONTRAST  Result Date: 01/17/2022 CLINICAL DATA:  Code stroke. Slurred speech and right-sided weakness EXAM: CT HEAD WITHOUT CONTRAST TECHNIQUE: Contiguous axial images were obtained from the base of the skull through the vertex without intravenous contrast. RADIATION DOSE REDUCTION: This exam was performed according to the departmental dose-optimization program which includes automated exposure control, adjustment of the mA and/or kV according to patient size and/or use of iterative reconstruction technique. COMPARISON:  07/24/2021 FINDINGS: Brain: No evidence of acute infarction, hemorrhage, hydrocephalus, extra-axial collection or mass lesion/mass effect. Chronic subcortical low-density in the left parietal region. Vascular: No hyperdense vessel or unexpected calcification. Skull: Normal. Negative for fracture or focal lesion. Sinuses/Orbits: No acute finding. Other: Prelim sent in epic chat ASPECTS [facility] Stroke Program Early CT Score) - Ganglionic level infarction (caudate, lentiform nuclei, internal capsule, insula, M1-M3 cortex): 7 - Supraganglionic infarction (M4-M6 cortex): 3 Total score (0-10 with 10 being normal): 10 IMPRESSION: 1. No acute or interval finding. 2. ASPECTS is 10 Electronically Signed   By:  Tiburcio Pea M.D.   On: 01/17/2022 08:56   DG Chest 2 View  Result Date: 01/15/2022 CLINICAL DATA:  Cough and rib pain EXAM: CHEST - 2 VIEW COMPARISON:  Chest radiograph September 27, 2021 FINDINGS: Patient is rotated to the right. Stable cardiac and mediastinal contours. No large area pulmonary consolidation. No pleural effusion or pneumothorax. Redemonstrated midthoracic spine wedge compression deformities. IMPRESSION: 1. No active cardiopulmonary disease. 2. Redemonstrated midthoracic spine wedge compression deformities. Electronically Signed   By: Annia Belt M.D.   On: 01/15/2022 14:33      HISTORY OF PRESENT ILLNESS Ms. Caris Cerveny is a 57 y.o. female with history of COPD, HLD, HTN, prior strokes and c/f functional neurological presentations presenting with acute worsening of prior right sided symptoms. MRI negative for acute infarct.   HOSPITAL COURSE Recrudescence of previous stroke symptoms in the setting of ? Mild UTI   Code Stroke CT head No acute abnormality. ASPECTS 10.    CTA head & neck No acute intracranial process MRI  - no acute infarct 2D Echo  LDL 129 HgbA1c pending UDS negative VTE prophylaxis - SCDs aspirin 81 mg daily prior to admission, now on aspirin 81 mg daily.  Therapy recommendations:  SNF Disposition:  pending   History of stroke 09/2015 admitted for left arm weakness and numbness.  CT no acute abnormality.  UDS positive for cocaine and opiates.  MRI showed right pontine infarct.  CTA head and neck unremarkable.  EF 65 to 70%.  LDL of 115, A1c 5.5.  Discharged on aspirin 81 and Lipitor 40 08/2016 admitted for slurred speech and left-sided numbness, difficulty walking.  CT negative.  Status post tPA.  CTA head and neck unremarkable.  MRI no acute infarct.  LDL 58, A1c 5.4.  UDS positive for cocaine again.  Discharged on aspirin 325 and Lipitor 20 10/2020 admitted again for right-sided weakness and slurred speech.  MRI no acute infarct but to old left parietal  infarct.  CT head and neck negative.  EF 60 to 69%.  Dr. Gerilyn Pilgrimoonquah saw patient, concerning for MRI negative stroke versus Todd's paralysis.  Discharged on DAPT and Lipitor.  Also put on Keppra.  05/2021 admitted for worsening dysarthria and right hand clumsiness.  CT, CTA head and neck, MRI no acute finding.  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  LDL 112/5.6.  UDS negative.  Discharged on DAPT and Lipitor 80.  Patient stated that she had not been taking Keppra at home.  Seizure less likely, did not restart Keppra 07/2021 admitted for fall and dizziness with left lower extremity numbness.  MRI negative.   Hypertension Home meds:  Valsartan (Not taking) Stable BP goal < 180/105 Long-term BP goal normotensive   Hyperlipidemia LDL 129, goal < 70 Add Atorvastatin 40mg   Continue statin at discharge   Tobacco abuse Current smoker but has cutting down a lot Smoking cessation counseling provided Nicotine patch provided Pt is willing to quit   Other Stroke Risk Factors     Other Active Problems UTI UA WBC 6-10 Left basilar atelectasis  IS at the bedside     DISCHARGE EXAM Blood pressure 115/83, pulse 81, temperature 97.8 F (36.6 C), temperature source Oral, resp. rate 11, height 6' (1.829 m), weight 79.5 kg, last menstrual period 07/30/2011, SpO2 92 %.  Neuro: Mental Status: Patient is awake, alert, oriented to person, place, month, year, and situation. Patient is able to give a clear and coherent history. No signs of aphasia or neglect Cranial Nerves: II: Visual Fields are full. Pupils are equal, round, and reactive to light.   III,IV, VI: EOMI without ptosis but she reports mild diplopia on left gaze V: Facial sensation is symmetric to light touch VII: Facial movement is symmetric.  VIII: hearing is intact to voice X: Uvula elevates symmetrically XI: Shoulder shrug is symmetric. XII: tongue is midline without atrophy or fasciculations.  Motor: Tone is  normal. Bulk is normal. 5/5 strength was present in all four extremities. Effort dependent throughout exam.  Sensory: She reports reduced sensation on the right arm and leg Cerebellar: FNF and HKS are intact bilaterally Gait:  Deferred in acute setting   Discharge Diet       Diet   Diet Heart Room service appropriate? Yes with Assist; Fluid consistency: Thin   liquids  DISCHARGE PLAN Disposition:  *** {anticoagulants:31417} for secondary stroke prevention for 3 *** then *** alone. Ongoing stroke risk factor control by Primary Care Physician at time of discharge Follow-up PCP Rakes, Doralee AlbinoLinda M, FNP in 2 weeks. Follow-up in [facility] in 8 weeks, office to schedule an appointment.   *** minutes were spent preparing discharge.  ***

## 2022-01-19 NOTE — Progress Notes (Addendum)
STROKE TEAM PROGRESS NOTE   INTERVAL HISTORY No family at the bedside. States she isn't able to use her right side, but on observation is able to lift objects using her right hand with no difficulty.   Vitals:   01/19/22 0911 01/19/22 1049 01/19/22 1100 01/19/22 1140  BP:      Pulse: 80  82 81  Resp: 13  13 11   Temp:      TempSrc:      SpO2: 93% 97%  92%  Weight:      Height:       CBC:  Recent Labs  Lab 01/17/22 0902 01/17/22 0903  WBC 6.8  --   NEUTROABS 5.3  --   HGB 13.0 13.6  HCT 38.5 40.0  MCV 90.4  --   PLT 180  --     Basic Metabolic Panel:  Recent Labs  Lab 01/17/22 0902 01/17/22 0903  NA 135 139  K 3.8 4.5  CL 100 103  CO2 23  --   GLUCOSE 121* 115*  BUN 12 14  CREATININE 0.80 0.70  CALCIUM 9.1  --     Lipid Panel:  Recent Labs  Lab 01/18/22 0511  CHOL 194  TRIG 106  HDL 44  CHOLHDL 4.4  VLDL 21  LDLCALC 03/19/22*    HgbA1c:  Recent Labs  Lab 01/17/22 1245  HGBA1C 5.9*   Urine Drug Screen:  Recent Labs  Lab 01/18/22 1139  LABOPIA NONE DETECTED  COCAINSCRNUR NONE DETECTED  LABBENZ NONE DETECTED  AMPHETMU NONE DETECTED  THCU NONE DETECTED  LABBARB NONE DETECTED    Alcohol Level  Recent Labs  Lab 01/17/22 0903  ETH <10     IMAGING past 24 hours No results found.  PHYSICAL EXAM Physical Exam  Constitutional: Appears well-developed and well-nourished.  Psych: Affect appropriate to situation, calm and cooperative, mildly anxious Eyes: No scleral injection HENT: No oropharyngeal obstruction.  MSK: no joint deformities.  Cardiovascular: Normal rate and regular rhythm. Perfusing extremities well Respiratory: Effort normal, non-labored breathing GI: Soft.  No distension. There is no tenderness.  Skin: Warm dry and intact visible skin   Neuro: Mental Status: Patient is awake, alert, oriented to person, place, month, year, and situation. Patient is able to give a clear and coherent history. No signs of aphasia or  neglect Cranial Nerves: II: Visual Fields are full. Pupils are equal, round, and reactive to light.   III,IV, VI: EOMI without ptosis but she reports mild diplopia on left gaze V: Facial sensation is symmetric to light touch VII: Facial movement is symmetric.  VIII: hearing is intact to voice X: Uvula elevates symmetrically XI: Shoulder shrug is symmetric. XII: tongue is midline without atrophy or fasciculations.  Motor: Tone is normal. Bulk is normal. Mild right sided weakness. effort dependent. Fluctuates throughout exam, moves all extremities antigravity.  Sensory: She reports reduced sensation on the right arm and leg Cerebellar: FNF and HKS are intact bilaterally. No ataxia noted in any extremity Gait:  Deferred in acute setting   ASSESSMENT/PLAN [name] is a 57 y.o. female with history of COPD, HLD, HTN, prior strokes and c/f functional neurological presentations presenting with acute worsening of prior right sided symptoms this morning.    Recrudescence of previous stroke symptoms in the setting of ? Mild UTI   Code Stroke CT head No acute abnormality. ASPECTS 10.    CTA head & neck No acute intracranial process MRI  - no acute infarct 2D Echo  LDL 129 HgbA1c 5.9 UDS negative VTE prophylaxis - SCDs aspirin 81 mg daily prior to admission, now continued aspirin 81 mg daily.  Therapy recommendations:  SNF Disposition:  pending  History of stroke 09/2015 admitted for left arm weakness and numbness.  CT no acute abnormality.  UDS positive for cocaine and opiates.  MRI showed right pontine infarct.  CTA head and neck unremarkable.  EF 65 to 70%.  LDL of 115, A1c 5.5.  Discharged on aspirin 81 and Lipitor 40 08/2016 admitted for slurred speech and left-sided numbness, difficulty walking.  CT negative.  Status post tPA.  CTA head and neck unremarkable.  MRI no acute infarct.  LDL 58, A1c 5.4.  UDS positive for cocaine again.  Discharged on aspirin 325 and Lipitor 20 10/2020  admitted again for right-sided weakness and slurred speech.  MRI no acute infarct but two old left parietal infarct.  CT head and neck negative.  EF 60 to 69%.  Dr. Merlene Laughter saw patient, concerning for MRI negative stroke versus Todd's paralysis.  Discharged on DAPT and Lipitor.  Also put on Keppra.  05/2021 admitted for worsening dysarthria and right hand clumsiness.  CT, CTA head and neck, MRI no acute finding.  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  LDL 112/5.6.  UDS negative.  Discharged on DAPT and Lipitor 80.  Patient stated that she had not been taking Keppra at home.  Seizure less likely, did not restart Keppra 07/2021 admitted for fall and dizziness with left lower extremity numbness.  MRI negative.  Hypertension Home meds:  Valsartan (Not taking) Stable BP goal < 180/105 Long-term BP goal normotensive  Hyperlipidemia LDL 129, goal < 70 Add Atorvastatin 40mg   Continue statin at discharge  Tobacco abuse Current smoker but has cutting down a lot Smoking cessation counseling provided Nicotine patch provided Pt is willing to quit  Other Stroke Risk Factors   Other Active Problems UTI UA WBC 6-10 Left basilar atelectasis  IS at the bedside   Hospital day # 2  Patient seen and examined by NP/APP with MD. MD to update note as needed.   Alice Ores, DNP, FNP-BC [facility] Pager: [phone]  ATTENDING NOTE: I reviewed above note and agree with the assessment and plan.   No acute event overnight.  Neuro stable, still complaining of right-sided mild weakness with functional component.  PT/OT recommend SNF.  Continue aspirin and statin.  Pending placement.  For detailed assessment and plan, please refer to above/below as I have made changes wherever appropriate.   Rosalin Hawking, MD PhD Stroke Neurology 01/19/2022 7:33 PM    After hours, contact General Neurology

## 2022-01-19 NOTE — Progress Notes (Signed)
PHARMACIST - PHYSICIAN COMMUNICATION  DR:   Erlinda Hong  CONCERNING: IV to Oral Route Change Policy  RECOMMENDATION: This patient is receiving protonix by the intravenous route.  Based on criteria approved by the Pharmacy and Therapeutics Committee, the intravenous medication(s) is/are being converted to the equivalent oral dose form(s).   DESCRIPTION: These criteria include: The patient is eating (either orally or via tube) and/or has been taking other orally administered medications for a least 24 hours The patient has no evidence of active gastrointestinal bleeding or impaired GI absorption (gastrectomy, short bowel, patient on TNA or NPO).  If you have questions about this conversion, please contact the Pharmacy Department  []   [phone] )  [facility] []   [phone] )  [facility] [x]   [phone] )  [facility] []   [phone] )  [facility] []   [phone] )  [facility]

## 2022-01-19 NOTE — TOC Initial Note (Signed)
Transition of Care [facility]) - Initial/Assessment Note    Patient Details  Name: Alice [name] MRN: [id number] Date of Birth: 03-16-1965  Transition of Care [facility]) CM/SW Contact:    Benard Halsted, LCSW Phone Number: 01/19/2022, 4:23 PM  Clinical Narrative:                 CSW received consult for possible SNF placement at time of discharge. CSW spoke with patient. Patient reported that patient's boyfriend is not home yet and she has no one to care for patient at their home given patient's current physical needs and fall risk. Patient expressed understanding of PT recommendation and is agreeable to SNF placement at time of discharge. Patient reports preference for [facility]. CSW discussed insurance authorization process and will provide Medicare SNF ratings list. CSW will send out referrals for review and provide bed offers as available.   Update: [facility] unable to accept patient. Will provide additional bed offers.   Skilled Nursing Rehab Facilities-   [email or web address]   Ratings out of 5 stars (5 the highest)   Name Address  Phone # [place] Inspection Overall  [facility] [address] [place] 4 5 2 3   [facility]  [address], [place] [phone] 4 2 5 5   [facility] [place], [place] 1 3 1 1   [facility] [place], [place] 2 2 4 4   [facility] [address], [place] 2 1 2 1   [place] [address], [place] [phone] 3 3 4 4   [facility] [address], [place] 4 1 3 2   [facility] [address] [place] 4 1 3 2   [address] ([place]) [place], [place] [phone] 3 1 2 1   [facility] [address], [place] [phone] 3 1 1 1   [facility] [address], [facility] [phone] 3 2 2 2    [facility] ([place]) [place]. [place], [place] [phone] 3 1 1 1   [facility]ock [address] [phone] 4 2 4 4           [place] [address] [place] 4 1 3 2   [facility] [place] [address], [place] 3 1 5 4   [facility], [place], [address] [phone] 1 1 2 1   [facility] [address], [place] [phone] 2 2 4 4           [address] (no [facility]) [place] [address], [place] [phone] 5 5 5 5   [facility] (No Humana) [address] [phone] [address], [employer] 4 1 4 3   [facility] (No UHC) [place], [place] 5 5 5 5   [facility] [address] [phone] 2 3 5 5   [place] [address] [place] 1 1 2 1   [facility] [address], [address] [place] 3 1 1 1   [phone] [place], [place] 5 2 5 5   [facility]  [address] [place] 2 2 1 1   [facility] [address] [place] 3 2 1 1   [facility] [place], [place] 2 2 2 2           [facility] [address],  [place] [phone] 1 1 1 1   [place] [address]  [phone] 2 4 3 3   [facility] [place] [address], [phone] 3 2 3 3   [facility] [place] [address], [place] [phone] 2 1 1 1   [facility] (No Humana) [address], [address] [phone] 2 2 3 3   [facility] [address], [place] [phone] 2 1 1 1           [facility] [address] [place], Rosalita Levan [phone] 5 4 5 5   [facility])  [address], [place] 2 1 2 1   [facility]) [address] [place], [facility] 3 1 4 3   [facility] [address] [phone] 3 3 4 4   [address] [place], [phone] 2 3 1 1   [facility] Rehab [facility]) [address] [place] [phone] 2 1 4 3      Expected Discharge Plan: Skilled Nursing Facility Barriers to Discharge: Insurance Authorization, SNF Pending bed offer   Patient Goals and CMS Choice Patient states their goals for this hospitalization and ongoing recovery are:: Rehab CMS Medicare.gov Compare Post Acute Care list provided to:: Patient Choice offered to / list presented to : Patient [place] ownership interest in [facility].provided to:: Patient    Expected Discharge Plan and Services In-house Referral: Clinical Social Work   Post Acute Care Choice: Skilled Nursing Facility Living arrangements for the past 2 months: Single Family Home                                      Prior Living Arrangements/Services Living arrangements for the past 2 months: Single Family Home Lives with:: Self Patient language and need for interpreter reviewed:: Yes Do you feel safe going back to the place where you live?: Yes      Need for Family Participation in Patient Care: Yes (Comment) Care giver support system in place?: Yes (comment) Current home services: Homehealth aide Criminal Activity/Legal Involvement Pertinent to Current Situation/Hospitalization: No - Comment as needed  Activities of Daily Living Home Assistive Devices/Equipment: Lewayne Bunting (specify type) ADL Screening (condition at time of admission) Patient's cognitive ability adequate to safely complete daily activities?: Yes Is the patient deaf or have difficulty hearing?: No Does the patient have difficulty seeing, even when wearing glasses/contacts?: No Does the patient have difficulty concentrating, remembering, or making decisions?: No Patient able to express need for assistance with ADLs?: No Does the patient have difficulty dressing or bathing?:  No Independently performs ADLs?: Yes (appropriate for developmental age) Does the patient have difficulty walking or climbing stairs?: No Weakness of Legs: Right Weakness of Arms/Hands: None  Permission Sought/Granted Permission sought to share information with : Facility [facility], Family Supports Permission granted to share information with : Yes, Verbal Permission Granted     Permission granted to share info w AGENCY: SNFs        Emotional Assessment Appearance:: Appears stated age Attitude/Demeanor/Rapport: Engaged Affect (typically observed): Tearful/Crying, Accepting, Appropriate Orientation: : Oriented to Self, Oriented to Place, Oriented to  Time, Oriented to Situation Alcohol / Substance Use: Not Applicable Psych Involvement: No (comment)  Admission diagnosis:  Stroke-like symptoms [R29.90] Stroke-like symptom [R29.90] Patient Active Problem List   Diagnosis Date Noted   Stroke-like symptom 01/17/2022   COPD  GOLD ? /  Group B still smoking 01/15/2022   Upper airway cough syndrome 01/14/2022   Vertigo 07/24/2021   Fall at home, initial encounter 07/24/2021   GERD (gastroesophageal reflux disease) 07/24/2021   TIA (transient ischemic attack) 05/31/2021   Full incontinence of feces 02/13/2021   Urinary incontinence 02/13/2021   Hospital discharge follow-up 12/12/2020   Depression, major, single episode, moderate (HCC) 10/22/2020   Chronic pain of right knee 11/01/2018   GAD (generalized anxiety disorder) 11/01/2018   Vitamin D deficiency 11/01/2018   Acute bacterial endocarditis 07/27/2018   Ataxia 09/08/2016   Dysarthria 09/08/2016   H/o Prior ischemic stroke (HCC) 09/03/2016   Chronic hepatitis C virus infection (HCC) 05/09/2016   History of stroke 05/09/2016   History of substance abuse (HCC) 05/09/2016   Stroke-like symptoms 05/09/2016   Sinusitis 05/09/2016   Heart murmur 01/08/2016   IVDU (intravenous drug user) 01/08/2016   COPD with  exacerbation (HCC) 10/31/2015   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   AKI (acute kidney injury) (HCC) 10/04/2015   Dyslipidemia 10/04/2015   HTN (hypertension) 10/04/2015   Discitis 10/04/2015   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/08/2015   Muscular deconditioning 05/24/2013   Postlaminectomy syndrome, lumbar region 12/04/2012   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   Adverse drug reaction 06/17/2012   Anemia 06/17/2012   Chronic low back pain 06/17/2012   DDD (degenerative disc disease), lumbosacral 06/17/2012   Depressive disorder 06/17/2012   Insomnia 06/17/2012   Pain in soft tissues of limb 06/17/2012   Myalgia and myositis 06/17/2012   Thoracic or lumbosacral neuritis or radiculitis 06/17/2012   Sacroiliitis (HCC) 06/17/2012   Opioid use disorder, severe, dependence (HCC) 07/30/2011    Class: Acute   Precordial pain 02/18/2010   Cigarette smoker 12/05/2009   Anxiety state 12/05/2009   Palpitations 12/05/2009   PCP:  Sonny Masters, FNP Pharmacy:   [facility] [place], [place] - [address] [place] [address] Phone: [phone] Fax: [phone]  [id number] [place], [place] - [address] [place] [address] Phone: [phone] Fax: [phone]     Social Determinants of Health (SDOH) Social History: SDOH Screenings   Food Insecurity: No Food Insecurity (07/28/2021)  Housing: Low Risk  (07/28/2021)  Transportation Needs: No Transportation Needs (07/28/2021)  Depression (PHQ2-9): Medium Risk (08/28/2021)  Financial Resource Strain: Low Risk  (07/28/2021)  Tobacco Use: High Risk (01/17/2022)   SDOH Interventions:     Readmission Risk Interventions    10/29/2020   10:25 AM  Readmission Risk Prevention Plan  Transportation Screening Complete  Home Care Screening Complete  Medication Review (RN CM) Complete

## 2022-01-19 NOTE — [place] FL2 (Signed)
[place] MEDICAID FL2 LEVEL OF CARE FORM     IDENTIFICATION  Patient Name: Alice [name] Birthdate: [birth year] Sex: female Admission Date (Current Location): 01/17/2022  [facility] and [place] Number:  [employer] and Address:  [facility] [place], [address], [place], [place] [address]      Provider Number: [id number]  Attending Physician Name and Address:  Stroke, Md, MD  Relative Name and Phone Number:       Current Level of Care: Hospital Recommended Level of Care: Skilled Nursing Facility Prior Approval Number:    Date Approved/Denied:   PASRR Number: [id number]  Discharge Plan: SNF    Current Diagnoses: Patient Active Problem List   Diagnosis Date Noted   Stroke-like symptom 01/17/2022   COPD  GOLD ? /  Group B still smoking 01/15/2022   Upper airway cough syndrome 01/14/2022   Vertigo 07/24/2021   Fall at home, initial encounter 07/24/2021   GERD (gastroesophageal reflux disease) 07/24/2021   TIA (transient ischemic attack) 05/31/2021   Full incontinence of feces 02/13/2021   Urinary incontinence 02/13/2021   Hospital discharge follow-up 12/12/2020   Depression, major, single episode, moderate (HCC) 10/22/2020   Chronic pain of right knee 11/01/2018   GAD (generalized anxiety disorder) 11/01/2018   Vitamin D deficiency 11/01/2018   Acute bacterial endocarditis 07/27/2018   Ataxia 09/08/2016   Dysarthria 09/08/2016   H/o Prior ischemic stroke (HCC) 09/03/2016   Chronic hepatitis C virus infection (HCC) 05/09/2016   History of stroke 05/09/2016   History of substance abuse (HCC) 05/09/2016   Stroke-like symptoms 05/09/2016   Sinusitis 05/09/2016   Heart murmur 01/08/2016   IVDU (intravenous drug user) 01/08/2016   COPD with exacerbation (HCC) 10/31/2015   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   AKI (acute kidney injury) (HCC) 10/04/2015   Dyslipidemia 10/04/2015   HTN (hypertension) 10/04/2015   Discitis  10/04/2015   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/08/2015   Muscular deconditioning 05/24/2013   Postlaminectomy syndrome, lumbar region 12/04/2012   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   Adverse drug reaction 06/17/2012   Anemia 06/17/2012   Chronic low back pain 06/17/2012   DDD (degenerative disc disease), lumbosacral 06/17/2012   Depressive disorder 06/17/2012   Insomnia 06/17/2012   Pain in soft tissues of limb 06/17/2012   Myalgia and myositis 06/17/2012   Thoracic or lumbosacral neuritis or radiculitis 06/17/2012   Sacroiliitis (HCC) 06/17/2012   Opioid use disorder, severe, dependence (HCC) 07/30/2011   Precordial pain 02/18/2010   Cigarette smoker 12/05/2009   Anxiety state 12/05/2009   Palpitations 12/05/2009    Orientation RESPIRATION BLADDER Height & Weight     Self, Time, Situation, Place  Normal Incontinent, External catheter Weight: 175 lb 4.3 oz (79.5 kg) Height:  6' (182.9 cm)  BEHAVIORAL SYMPTOMS/MOOD NEUROLOGICAL BOWEL NUTRITION STATUS      Continent Diet (See dc summary)  AMBULATORY STATUS COMMUNICATION OF NEEDS Skin   Limited Assist Verbally Normal                       Personal Care Assistance Level of Assistance  Bathing, Feeding, Dressing Bathing Assistance: Limited assistance Feeding assistance: Independent Dressing Assistance: Limited assistance     Functional Limitations Info             SPECIAL CARE FACTORS FREQUENCY  PT (By licensed PT), OT (By licensed OT)     PT Frequency: 2x/week OT Frequency: 2x/week  Contractures Contractures Info: Not present    Additional Factors Info  Code Status, Allergies Code Status Info: Full Allergies Info: Benadryl (Diphenhydramine Hcl), Codeine, Tetracycline           Current Medications (01/19/2022):  This is the current hospital active medication list Current Facility-Administered Medications  Medication Dose Route Frequency Provider Last  Rate Last Admin   0.9 %  sodium chloride infusion   Intravenous Continuous Bhagat, Srishti L, MD 50 mL/hr at 01/18/22 2136 New Bag at 01/18/22 2136   acetaminophen (TYLENOL) tablet 650 mg  650 mg Oral Q4H PRN Bhagat, Srishti L, MD   650 mg at 01/18/22 1022   Or   acetaminophen (TYLENOL) 160 MG/5ML solution 650 mg  650 mg Per Tube Q4H PRN Bhagat, Srishti L, MD       Or   acetaminophen (TYLENOL) suppository 650 mg  650 mg Rectal Q4H PRN Bhagat, Srishti L, MD       albuterol (PROVENTIL) (2.5 MG/3ML) 0.083% nebulizer solution 2.5 mg  2.5 mg Nebulization Q4H PRN Bhagat, Srishti L, MD   2.5 mg at 01/19/22 1048   aspirin EC tablet 81 mg  81 mg Oral Daily Shafer, Marcelino Scot, NP   81 mg at 01/19/22 0811   atorvastatin (LIPITOR) tablet 40 mg  40 mg Oral Daily Rosalin Hawking, MD   40 mg at 01/19/22 0811   baclofen (LIORESAL) tablet 10 mg  10 mg Oral BID Bhagat, Srishti L, MD   10 mg at 01/19/22 0811   buprenorphine-naloxone (SUBOXONE) 8-2 mg per SL tablet 0.5 tablet  0.5 tablet Sublingual Q6H Donnetta Simpers, MD   0.5 tablet at 01/19/22 0605   Chlorhexidine Gluconate Cloth 2 % PADS 6 each  6 each Topical Daily Bhagat, Srishti L, MD   6 each at 01/19/22 0814   famotidine (PEPCID) tablet 20 mg  20 mg Oral QHS Bhagat, Srishti L, MD   20 mg at 01/18/22 2127   gabapentin (NEURONTIN) capsule 100 mg  100 mg Oral BID Bhagat, Srishti L, MD   100 mg at 01/19/22 1779   labetalol (NORMODYNE) injection 5 mg  5 mg Intravenous Q2H PRN Bhagat, Srishti L, MD       nicotine (NICODERM CQ - dosed in mg/24 hr) patch 7 mg  7 mg Transdermal Daily Bhagat, Srishti L, MD   7 mg at 01/19/22 3903   Oral care mouth rinse  15 mL Mouth Rinse PRN Bhagat, Srishti L, MD       pantoprazole (PROTONIX) injection 40 mg  40 mg Intravenous QHS Bhagat, Srishti L, MD   40 mg at 01/18/22 2127   senna-docusate (Senokot-S) tablet 1 tablet  1 tablet Oral QHS PRN Bhagat, Dutch Quint, MD         Discharge Medications: Please see discharge summary for a list  of discharge medications.  Relevant Imaging Results:  Relevant Lab Results:   Additional Information SSN: [place]  [place], [place]

## 2022-01-19 NOTE — Progress Notes (Signed)
Physical Therapy Treatment Patient Details Name: Alice [name] MRN: [id number] DOB: 03-12-1965 Today's Date: 01/19/2022   History of Present Illness Alice [name] is a 57 y.o. female amitted for questionable CVA affecting her right UE.  CT and MRI negative for acute CVA however pt with chronic left parietal CVA noted.  PMHx of COPD, HLD, HTN,    PT Comments    Pt very tearful this session, stating she wants to know what is wrong with her and why her R arm and leg won't work. pt initially acting like she could not lift her R hand but then blew her nose with R hand no issues, varying levels of buckling RLE during gait. Even so, pt requiring up to mod physical assist to prevent fall during gait, and has very limited tolerance for gait at this time. Given pt is home alone during the day, continuing to recommend ST-SNF post-acutely. \     Recommendations for follow up therapy are one component of a multi-disciplinary discharge planning process, led by the attending physician.  Recommendations may be updated based on patient status, additional functional criteria and insurance authorization.  Follow Up Recommendations  Skilled nursing-short term rehab (<3 hours/day) Can patient physically be transported by private vehicle: Yes   Assistance Recommended at Discharge Frequent or constant Supervision/Assistance  Patient can return home with the following A lot of help with walking and/or transfers;A lot of help with bathing/dressing/bathroom;Assist for transportation;Help with stairs or ramp for entrance   Equipment Recommendations   (TBD at next venue)    Recommendations for Other Services       Precautions / Restrictions Precautions Precautions: Fall Restrictions Weight Bearing Restrictions: No     Mobility  Bed Mobility Overal bed mobility: Needs Assistance Bed Mobility: Supine to Sit, Sit to Supine     Supine to sit: Supervision, HOB elevated Sit to supine: Supervision, HOB elevated    General bed mobility comments: for safety, increased time with use of HOB elevation    Transfers Overall transfer level: Needs assistance Equipment used: Rolling walker (2 wheels) Transfers: Sit to/from Stand Sit to Stand: Min assist           General transfer comment: assist for power up and steadying, STS x2 from EOB and chair    Ambulation/Gait Ambulation/Gait assistance: Mod assist Gait Distance (Feet): 15 Feet (x2, seated rest) Assistive device: Rolling walker (2 wheels) Gait Pattern/deviations: Decreased stride length, Ataxic, Trunk flexed, Narrow base of support Gait velocity: decr     General Gait Details: cues for upright posture, assist to steady, blocking intermittent R knee buckling. seated rest break x1 because pt became tearful stating "what is happening to me?"   Stairs             Wheelchair Mobility    Modified Rankin (Stroke Patients Only) Modified Rankin (Stroke Patients Only) Pre-Morbid Rankin Score: Moderate disability Modified Rankin: Moderately severe disability     Balance Overall balance assessment: Needs assistance Sitting-balance support: Feet supported, No upper extremity supported Sitting balance-Leahy Scale: Fair     Standing balance support: During functional activity, No upper extremity supported Standing balance-Leahy Scale: Poor                              Cognition Arousal/Alertness: Awake/alert Behavior During Therapy: WFL for tasks assessed/performed Overall Cognitive Status: Within Functional Limits for tasks assessed  Exercises      General Comments        Pertinent Vitals/Pain Pain Assessment Pain Assessment: 0-10 Faces Pain Scale: Hurts even more Pain Location: back pain Pain Descriptors / Indicators: Sore, Discomfort, Guarding, Grimacing Pain Intervention(s): Limited activity within patient's tolerance, Monitored during session,  Repositioned    Home Living                          Prior Function            PT Goals (current goals can now be found in the care plan section) Acute Rehab PT Goals Patient Stated Goal: get stronger PT Goal Formulation: With patient Time For Goal Achievement: 02/01/22 Potential to Achieve Goals: Good Progress towards PT goals: Progressing toward goals    Frequency    Min 3X/week      PT Plan Current plan remains appropriate    Co-evaluation              AM-PAC PT "6 Clicks" Mobility   Outcome Measure  Help needed turning from your back to your side while in a flat bed without using bedrails?: A Little Help needed moving from lying on your back to sitting on the side of a flat bed without using bedrails?: A Little Help needed moving to and from a bed to a chair (including a wheelchair)?: A Lot Help needed standing up from a chair using your arms (e.g., wheelchair or bedside chair)?: A Lot Help needed to walk in hospital room?: A Lot Help needed climbing 3-5 steps with a railing? : A Lot 6 Click Score: 14    End of Session Equipment Utilized During Treatment: Gait belt Activity Tolerance: Patient tolerated treatment well Patient left: with call bell/phone within reach;in bed;with bed alarm set Nurse Communication: Mobility status PT Visit Diagnosis: Unsteadiness on feet (R26.81);Muscle weakness (generalized) (M62.81);Difficulty in walking, not elsewhere classified (R26.2)     Time: 9485-4627 PT Time Calculation (min) (ACUTE ONLY): 18 min  Charges:  $Gait Training: 8-22 mins                    Stacie Glaze, PT DPT Acute Rehabilitation Services Pager [phone]  Office [phone]    [place] 01/19/2022, 12:05 PM

## 2022-01-19 NOTE — Plan of Care (Signed)

## 2022-01-20 DIAGNOSIS — I63339 Cerebral infarction due to thrombosis of unspecified posterior cerebral artery: Secondary | ICD-10-CM | POA: Diagnosis not present

## 2022-01-20 NOTE — Progress Notes (Addendum)
STROKE TEAM PROGRESS NOTE   INTERVAL HISTORY No family at the bedside.  On observation, patient able to move right upper and lower extremity without difficulty, able to use right hand to manipulate objects without a problem.  Vitals:   01/19/22 2319 01/20/22 0311 01/20/22 0900 01/20/22 1300  BP: 93/62 90/69 133/79 123/78  Pulse: 69 61 76 79  Resp: 15 16 14 20   Temp: 98.1 F (36.7 C) 98 F (36.7 C) 97.8 F (36.6 C) 97.8 F (36.6 C)  TempSrc: Oral Oral Oral Oral  SpO2: 91% 90%    Weight:      Height:       CBC:  Recent Labs  Lab 01/17/22 0902 01/17/22 0903  WBC 6.8  --   NEUTROABS 5.3  --   HGB 13.0 13.6  HCT 38.5 40.0  MCV 90.4  --   PLT 180  --     Basic Metabolic Panel:  Recent Labs  Lab 01/17/22 0902 01/17/22 0903  NA 135 139  K 3.8 4.5  CL 100 103  CO2 23  --   GLUCOSE 121* 115*  BUN 12 14  CREATININE 0.80 0.70  CALCIUM 9.1  --     Lipid Panel:  Recent Labs  Lab 01/18/22 0511  CHOL 194  TRIG 106  HDL 44  CHOLHDL 4.4  VLDL 21  LDLCALC 129*    HgbA1c:  Recent Labs  Lab 01/17/22 1245  HGBA1C 5.9*    Urine Drug Screen:  Recent Labs  Lab 01/18/22 1139  LABOPIA NONE DETECTED  COCAINSCRNUR NONE DETECTED  LABBENZ NONE DETECTED  AMPHETMU NONE DETECTED  THCU NONE DETECTED  LABBARB NONE DETECTED     Alcohol Level  Recent Labs  Lab 01/17/22 0903  ETH <10     IMAGING past 24 hours No results found.  PHYSICAL EXAM Physical Exam  Constitutional: Appears well-developed and well-nourished.  Psych: Affect appropriate to situation, calm and cooperative, mildly anxious Eyes: No scleral injection HENT: No oropharyngeal obstruction.  MSK: no joint deformities.  Respiratory: Effort normal, non-labored breathing Skin: Warm dry and intact visible skin   Neuro: Mental Status: Patient is awake, alert, oriented to person, place, month, year, and situation. Patient is able to give a clear and coherent history. No signs of aphasia or  neglect Cranial Nerves: II: Visual Fields are full. Pupils are equal, round, and reactive to light.   III,IV, VI: EOMI without ptosis  V: Facial sensation is symmetric to light touch VII: Facial movement is symmetric.  VIII: hearing is intact to voice X: Uvula elevates symmetrically XI: Shoulder shrug is symmetric. XII: tongue is midline without atrophy or fasciculations.  Motor: Tone is normal. Bulk is normal.  Able to use bilateral and upper lower and lower extremities normally with good antigravity strength Sensory: She reports reduced sensation on the right leg Cerebellar: FNF are intact bilaterally. No ataxia noted in any extremity Gait:  Deferred in acute setting   ASSESSMENT/PLAN [name] is a 57 y.o. female with history of COPD, HLD, HTN, prior strokes and c/f functional neurological presentations presenting with acute worsening of prior right sided symptoms this morning.    Recrudescence of previous stroke symptoms in the setting of ? Mild UTI   Code Stroke CT head No acute abnormality. ASPECTS 10.    CTA head & neck No acute intracranial process MRI  - no acute infarct 2D Echo  LDL 129 HgbA1c 5.9 UDS negative VTE prophylaxis - SCDs aspirin 81 mg daily  prior to admission, continue aspirin 81 mg daily.  Therapy recommendations:  SNF Disposition:  pending  History of stroke 09/2015 admitted for left arm weakness and numbness.  CT no acute abnormality.  UDS positive for cocaine and opiates.  MRI showed right pontine infarct.  CTA head and neck unremarkable.  EF 65 to 70%.  LDL of 115, A1c 5.5.  Discharged on aspirin 81 and Lipitor 40 08/2016 admitted for slurred speech and left-sided numbness, difficulty walking.  CT negative.  Status post tPA.  CTA head and neck unremarkable.  MRI no acute infarct.  LDL 58, A1c 5.4.  UDS positive for cocaine again.  Discharged on aspirin 325 and Lipitor 20 10/2020 admitted again for right-sided weakness and slurred speech.  MRI no  acute infarct but two old left parietal infarct.  CT head and neck negative.  EF 60 to 69%.  Dr. Merlene Laughter saw patient, concerning for MRI negative stroke versus Todd's paralysis.  Discharged on DAPT and Lipitor.  Also put on Keppra.  05/2021 admitted for worsening dysarthria and right hand clumsiness.  CT, CTA head and neck, MRI no acute finding.  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  LDL 112/5.6.  UDS negative.  Discharged on DAPT and Lipitor 80.  Patient stated that she had not been taking Keppra at home.  Seizure less likely, did not restart Keppra 07/2021 admitted for fall and dizziness with left lower extremity numbness.  MRI negative.  Hypertension Home meds:  Valsartan (Not taking) Stable BP goal < 180/105 Long-term BP goal normotensive  Hyperlipidemia LDL 129, goal < 70 Add Atorvastatin 40mg   Continue statin at discharge  Tobacco abuse Current smoker but has cutting down a lot Smoking cessation counseling provided Nicotine patch provided Pt is willing to quit  Other Stroke Risk Factors   Other Active Problems UTI UA WBC 6-10 Left basilar atelectasis  IS at the bedside   Hospital day # 3  Patient seen and examined by NP/APP with MD. MD to update note as needed.  [place] , MSN, AGACNP-BC [facility] See Amion for schedule and pager information 01/20/2022 2:21 PM   ATTENDING NOTE: I reviewed above note and agree with the assessment and plan. Pt was seen and examined.   No family at the bedside. Pt reclining in bed, finished lunch, eating well. Observed to pick up drink on the table and drank it without difficulty. Pending SNF placement. Continue ASA and statin. Smoking cessation education again provided. She has cut down from 3 PPD to 2 cig per day, and willing to quit completely.   For detailed assessment and plan, please refer to above/below as I have made changes wherever appropriate.   Rosalin Hawking, MD PhD Stroke  Neurology 01/20/2022 4:45 PM    After hours, contact General Neurology

## 2022-01-20 NOTE — TOC Progression Note (Signed)
Transition of Care [facility]) - Progression Note    Patient Details  Name: Alice [name] MRN: [id number] Date of Birth: 01-Feb-1965  Transition of Care [facility]) CM/SW [place], LCSW Phone Number: 01/20/2022, 12:07 PM  Clinical Narrative:    CSW spoke with patient and she stated that she is waiting for her home aide to call her back because she believes she can stay with her until her boyfriend gets back home next week. CSW discussed SNF bed offers with patient. She requested [facility] as she knows where it is and stated it would be better to proceed with SNF placement instead of going straight home. CSW requested [facility] begin insurance authorization.    Expected Discharge Plan: Skilled Nursing Facility Barriers to Discharge: Insurance Authorization  Expected Discharge Plan and Services In-house Referral: Clinical Social Work   Post Acute Care Choice: [place] Living arrangements for the past 2 months: [place]                                       Social Determinants of Health (SDOH) Interventions SDOH Screenings   Food Insecurity: No Food Insecurity (07/28/2021)  Housing: Low Risk  (07/28/2021)  Transportation Needs: No Transportation Needs (07/28/2021)  Depression (PHQ2-9): Medium Risk (08/28/2021)  Financial Resource Strain: Low Risk  (07/28/2021)  Tobacco Use: High Risk (01/17/2022)    Readmission Risk Interventions    10/29/2020   10:25 AM  Readmission Risk Prevention Plan  Transportation Screening Complete  Home Care Screening Complete  Medication Review (RN CM) Complete

## 2022-01-20 NOTE — Plan of Care (Signed)
?  Problem: Education: ?Goal: Knowledge of General Education information will improve ?Description: Including pain rating scale, medication(s)/side effects and non-pharmacologic comfort measures ?Outcome: Progressing ?  ?Problem: Activity: ?Goal: Risk for activity intolerance will decrease ?Outcome: Progressing ?  ?Problem: Nutrition: ?Goal: Adequate nutrition will be maintained ?Outcome: Progressing ?  ?Problem: Coping: ?Goal: Level of anxiety will decrease ?Outcome: Progressing ?  ?Problem: Elimination: ?Goal: Will not experience complications related to urinary retention ?Outcome: Progressing ?  ?Problem: Pain Managment: ?Goal: General experience of comfort will improve ?Outcome: Progressing ?  ?

## 2022-01-20 NOTE — Progress Notes (Signed)
Occupational Therapy Treatment Patient Details Name: Alice [name] MRN: [id number] DOB: 01-08-1966 Today's Date: 01/20/2022   History of present illness Alice [name] is a 57 y.o. female amitted for questionable CVA affecting her right UE.  CT and MRI negative for acute CVA however pt with chronic left parietal CVA noted.  PMHx of COPD, HLD, HTN,   OT comments  Pt greeted seated EOB with NT present, pt agreeable to OT intervention. Session focus on BADL reeducation, functional mobility, dynamic standing balance and decreasing overall caregiver burden. Pt completed functional ambulation to sink with Rw and MODA. Pt walks with RLE bent and reports that her RLE "wont work" however seems as though pt sinking into UEs more when advancing RLE during gait. Pt completed full washup at sink with overall min guard. Pt using BUEs functionally during tasks with no UE deficits noted.Pt would continue to benefit from skilled occupational therapy while admitted and after d/c to address the below listed limitations in order to improve overall functional mobility and facilitate independence with BADL participation. DC plan remains appropriate, will follow acutely per POC.                           Recommendations for follow up therapy are one component of a multi-disciplinary discharge planning process, led by the attending physician.  Recommendations may be updated based on patient status, additional functional criteria and insurance authorization.    Follow Up Recommendations  Skilled nursing-short term rehab (<3 hours/day)     Assistance Recommended at Discharge Frequent or constant Supervision/Assistance  Patient can return home with the following  A little help with walking and/or transfers;A little help with bathing/dressing/bathroom;Assistance with cooking/housework;Assist for transportation;Help with stairs or ramp for entrance   Equipment Recommendations  Tub/shower seat    Recommendations for Other  Services      Precautions / Restrictions Precautions Precautions: Fall Restrictions Weight Bearing Restrictions: No       Mobility Bed Mobility Overal bed mobility: Modified Independent         Sit to supine: Modified independent (Device/Increase time)   General bed mobility comments: pt sitting EOB upon arrival, no assist needed to return to supine with use of bed features    Transfers Overall transfer level: Needs assistance Equipment used: Rolling walker (2 wheels) Transfers: Sit to/from Stand Sit to Stand: Min guard           General transfer comment: pt completing multiple STS with min guard with BUE support on Rw as pt prefers to pull on RW or sink to rise into standing     Balance Overall balance assessment: Needs assistance Sitting-balance support: Feet supported, No upper extremity supported Sitting balance-Leahy Scale: Fair     Standing balance support: During functional activity, No upper extremity supported, Bilateral upper extremity supported Standing balance-Leahy Scale: Poor Standing balance comment: can static stand with no UE support but stand with knees bent despite cues. pt preffered to lean on Rw or sink during session                           ADL either performed or assessed with clinical judgement   ADL Overall ADL's : Needs assistance/impaired     Grooming: Wash/dry face;Sitting;Set up   Upper Body Bathing: Supervision/ safety;Sitting   Lower Body Bathing: Min guard;Sit to/from stand   Upper Body Dressing : Minimal assistance;Sitting Upper Body Dressing Details (indicate cue type  and reason): hospital gown Lower Body Dressing: Min guard;Sitting/lateral leans Lower Body Dressing Details (indicate cue type and reason): figure 4 d/t back pain Toilet Transfer: Moderate assistance;Cueing for safety;Cueing for sequencing;Ambulation;Rolling walker (2 wheels) Toilet Transfer Details (indicate cue type and reason): simulated via  functional mobility to sink with Rw, pt leans into arms when advancing RLE like shes trying to buckle but doesnt present as true buckling in RLE Toileting- Clothing Manipulation and Hygiene: Min guard;Sit to/from stand       Functional mobility during ADLs: Moderate assistance;Cueing for safety;Cueing for sequencing;Rolling walker (2 wheels) General ADL Comments: ADL participation impacted by increased pain, impaired balance and self reports of numbness and decreased functional use in RLE    Extremity/Trunk Assessment Upper Extremity Assessment Upper Extremity Assessment: Overall WFL for tasks assessed (using BUEs appropriately during ADLS)   Lower Extremity Assessment Lower Extremity Assessment: Defer to PT evaluation        Vision Baseline Vision/History: 1 Wears glasses Ability to See in Adequate Light: 0 Adequate Patient Visual Report: No change from baseline     Perception Perception Perception: Within Functional Limits   Praxis Praxis Praxis: Intact    Cognition Arousal/Alertness: Awake/alert Behavior During Therapy: WFL for tasks assessed/performed Overall Cognitive Status: Impaired/Different from baseline Area of Impairment: Memory                     Memory: Decreased short-term memory         General Comments: did not recall walking 45ft yesterday with PT, fixating on her RLE not working however pt walking during session and usign R side appropriately during session        Exercises      Shoulder Instructions       General Comments VSS on RA, assisted pt with full bath    Pertinent Vitals/ Pain       Pain Assessment Pain Assessment: Faces Faces Pain Scale: Hurts little more Pain Location: back pain Pain Descriptors / Indicators: Sore, Discomfort, Guarding, Grimacing Pain Intervention(s): Limited activity within patient's tolerance, Monitored during session, Repositioned  Home Living                                           Prior Functioning/Environment              Frequency  Min 2X/week        Progress Toward Goals  OT Goals(current goals can now be found in the care plan section)  Progress towards OT goals: Progressing toward goals  Acute Rehab OT Goals Patient Stated Goal: wants to feel normal OT Goal Formulation: With patient Time For Goal Achievement: 02/01/22 Potential to Achieve Goals: Good  Plan Discharge plan remains appropriate;Frequency remains appropriate    Co-evaluation                 AM-PAC OT "6 Clicks" Daily Activity     Outcome Measure   Help from another person eating meals?: None Help from another person taking care of personal grooming?: A Little Help from another person toileting, which includes using toliet, bedpan, or urinal?: A Little Help from another person bathing (including washing, rinsing, drying)?: A Little Help from another person to put on and taking off regular upper body clothing?: A Little Help from another person to put on and taking off regular lower body clothing?: A Little 6 Click  Score: 19    End of Session Equipment Utilized During Treatment: Gait belt;Rolling walker (2 wheels)  OT Visit Diagnosis: Unsteadiness on feet (R26.81);Muscle weakness (generalized) (M62.81);Hemiplegia and hemiparesis Hemiplegia - Right/Left: Right Hemiplegia - caused by: Unspecified   Activity Tolerance Patient tolerated treatment well   Patient Left in bed;with call bell/phone within reach;with nursing/sitter in room   Nurse Communication Mobility status (nurse present at end of session)        Time: 5056-9794 OT Time Calculation (min): 31 min  Charges: OT General Charges $OT Visit: 1 Visit OT Treatments $Self Care/Home Management : 23-37 mins  Harley [place]., COTA/L Acute Rehabilitation Services [phone]   Precious Haws 01/20/2022, 12:02 PM

## 2022-01-20 NOTE — Plan of Care (Signed)
  Problem: Education: Goal: Knowledge of General Education information will improve Description Including pain rating scale, medication(s)/side effects and non-pharmacologic comfort measures Outcome: Progressing   Problem: Health Behavior/Discharge Planning: Goal: Ability to manage health-related needs will improve Outcome: Progressing   

## 2022-01-21 ENCOUNTER — Other Ambulatory Visit (HOSPITAL_COMMUNITY): Payer: Self-pay

## 2022-01-21 DIAGNOSIS — I63339 Cerebral infarction due to thrombosis of unspecified posterior cerebral artery: Secondary | ICD-10-CM | POA: Diagnosis not present

## 2022-01-21 MED ORDER — ATORVASTATIN CALCIUM 40 MG PO TABS
40.0000 mg | ORAL_TABLET | Freq: Every day | ORAL | 1 refills | Status: DC
  Filled 2022-01-21: qty 30, 30d supply, fill #0

## 2022-01-21 MED ORDER — NICOTINE 7 MG/24HR TD PT24
7.0000 mg | MEDICATED_PATCH | Freq: Every day | TRANSDERMAL | 0 refills | Status: DC
  Filled 2022-01-21: qty 28, 28d supply, fill #0

## 2022-01-21 NOTE — Progress Notes (Signed)
Physical Therapy Treatment Patient Details Name: Alice [name] MRN: [id number] DOB: 06-Nov-1965 Today's Date: 01/21/2022   History of Present Illness Alice [name] is a 57 y.o. female amitted for questionable CVA affecting her right UE.  CT and MRI negative for acute CVA however pt with chronic left parietal CVA noted.  PMHx of COPD, HLD, HTN,    PT Comments    Pt presents today with improved mobility from previous session. Pt appears motivated to return to PLOF, able to ambulate in hallway with RW and CGA for safety x40'. Pt provided cueing for upright posture, forward gaze, and increased B foot clearance and step length, intermittently compliant. Pt performing 5 sit<>stand trials, but limited by pain and fatigue at the end. Pt reports at home she can have full time care and has needed DME. Pt will continue to benefit from skilled acute PT at this time to progress functional mobility, discharge recommendations remain appropriate, with potential for home with continued progression and verified full time care.    Recommendations for follow up therapy are one component of a multi-disciplinary discharge planning process, led by the attending physician.  Recommendations may be updated based on patient status, additional functional criteria and insurance authorization.  Follow Up Recommendations  Skilled nursing-short term rehab (<3 hours/day) Can patient physically be transported by private vehicle: Yes   Assistance Recommended at Discharge Frequent or constant Supervision/Assistance  Patient can return home with the following A little help with walking and/or transfers;A little help with bathing/dressing/bathroom;Assist for transportation;Help with stairs or ramp for entrance   Equipment Recommendations  Other (comment) (pt reports she has all needed DME)    Recommendations for Other Services       Precautions / Restrictions Precautions Precautions: Fall Restrictions Weight Bearing  Restrictions: No     Mobility  Bed Mobility Overal bed mobility: Modified Independent Bed Mobility: Supine to Sit, Sit to Supine     Supine to sit: Modified independent (Device/Increase time), HOB elevated Sit to supine: Modified independent (Device/Increase time), HOB elevated   General bed mobility comments: pt transferring with use of bed rails    Transfers Overall transfer level: Needs assistance Equipment used: Rolling walker (2 wheels) Transfers: Sit to/from Stand, Bed to chair/wheelchair/BSC Sit to Stand: Min guard   Step pivot transfers: Min guard       General transfer comment: pt completing STS and transfer to/from the [facility] with min guard and use of RW. Able to complete 5 STS trials without RW, utilizing bed rail for assistance into standing.    Ambulation/Gait Ambulation/Gait assistance: Min guard Gait Distance (Feet): 40 Feet Assistive device: Rolling walker (2 wheels) Gait Pattern/deviations: Trunk flexed, Wide base of support, Decreased stride length, Knee flexed in stance - right, Knee flexed in stance - left Gait velocity: decreased     General Gait Details: downward gaze and increased trunk flexion with limited correction with cueing, decreased B foot clearance   Stairs             Wheelchair Mobility    Modified Rankin (Stroke Patients Only)       Balance Overall balance assessment: Modified Independent Sitting-balance support: Feet supported, No upper extremity supported Sitting balance-Leahy Scale: Good Sitting balance - Comments: pt sitting EOB and on BSC with good static sitting balance, no sway noted   Standing balance support: During functional activity, Bilateral upper extremity supported Standing balance-Leahy Scale: Fair Standing balance comment: static and dynamic balance fair with RW, CGA provided for safety as pt  reporting feeling weak in her legs                            Cognition Arousal/Alertness:  Awake/alert Behavior During Therapy: WFL for tasks assessed/performed Overall Cognitive Status: Within Functional Limits for tasks assessed                                          Exercises      General Comments        Pertinent Vitals/Pain Pain Assessment Pain Assessment: 0-10 Pain Score: 8  Pain Location: BLE and back Pain Descriptors / Indicators: Aching Pain Intervention(s): Heat applied, Monitored during session, Other (comment) (RN notified)    Home Living                          Prior Function            PT Goals (current goals can now be found in the care plan section) Acute Rehab PT Goals Patient Stated Goal: get stronger PT Goal Formulation: With patient Time For Goal Achievement: 02/01/22 Potential to Achieve Goals: Good Progress towards PT goals: Progressing toward goals    Frequency    Min 3X/week      PT Plan Current plan remains appropriate    Co-evaluation              AM-PAC PT "6 Clicks" Mobility   Outcome Measure  Help needed turning from your back to your side while in a flat bed without using bedrails?: None Help needed moving from lying on your back to sitting on the side of a flat bed without using bedrails?: None Help needed moving to and from a bed to a chair (including a wheelchair)?: A Little Help needed standing up from a chair using your arms (e.g., wheelchair or bedside chair)?: A Little Help needed to walk in hospital room?: A Little Help needed climbing 3-5 steps with a railing? : A Lot 6 Click Score: 19    End of Session Equipment Utilized During Treatment: Gait belt Activity Tolerance: Patient tolerated treatment well Patient left: in bed;with call bell/phone within reach Nurse Communication: Mobility status PT Visit Diagnosis: Unsteadiness on feet (R26.81);Muscle weakness (generalized) (M62.81);Difficulty in walking, not elsewhere classified (R26.2)     Time: 6301-6010 PT Time  Calculation (min) (ACUTE ONLY): 17 min  Charges:  $Gait Training: 8-22 mins                     Charlynne Cousins, PT DPT Acute Rehabilitation Services Office [phone]    [name] 01/21/2022, 1:11 PM

## 2022-01-21 NOTE — TOC Transition Note (Addendum)
Transition of Care [facility]) - CM/SW Discharge Note   Patient Details  Name: Alice [name] MRN: [id number] Date of Birth: 1965/07/01  Transition of Care [facility]) CM/SW Contact:  Verdell Carmine, RN Phone Number: 01/21/2022, 9:08 AM   Clinical Narrative:    Spoke to patient at bedside, discussed difficulty in securing home health.  [facility]. She has a walker and does not feel like she needs further DME. Referral sent per her choice, to [facility] in [place].  Patient to be discharged today.   Orders in for OP PT,   Final next level of care: Home/Self Care Barriers to Discharge: No Barriers Identified   Patient Goals and CMS Choice CMS Medicare.gov Compare Post Acute Care list provided to:: Patient Choice offered to / list presented to : Patient  Discharge Placement    Home with friend, OP PT                     Discharge Plan and Services Additional resources added to the After Visit Summary for   In-house Referral: Clinical Social Work   Post Acute Care Choice: [place]                               Social Determinants of Health (SDOH) Interventions SDOH Screenings   Food Insecurity: No Food Insecurity (07/28/2021)  Housing: Low Risk  (07/28/2021)  Transportation Needs: No Transportation Needs (07/28/2021)  Depression (PHQ2-9): Medium Risk (08/28/2021)  Financial Resource Strain: Low Risk  (07/28/2021)  Tobacco Use: High Risk (01/17/2022)     Readmission Risk Interventions    10/29/2020   10:25 AM  Readmission Risk Prevention Plan  Transportation Screening Complete  Home Care Screening Complete  Medication Review (RN CM) Complete

## 2022-01-21 NOTE — Discharge Instructions (Signed)
Alice [name], you were admitted with worsening right sided weakness.  Your MRI was negative for acute stroke.  You will need to follow up with outpatient PT and will need to be seen in the stroke clinic in 6-8 weeks.

## 2022-01-22 DIAGNOSIS — I63339 Cerebral infarction due to thrombosis of unspecified posterior cerebral artery: Secondary | ICD-10-CM | POA: Diagnosis not present

## 2022-01-23 DIAGNOSIS — I63339 Cerebral infarction due to thrombosis of unspecified posterior cerebral artery: Secondary | ICD-10-CM | POA: Diagnosis not present

## 2022-01-24 DIAGNOSIS — I63339 Cerebral infarction due to thrombosis of unspecified posterior cerebral artery: Secondary | ICD-10-CM | POA: Diagnosis not present

## 2022-01-26 DIAGNOSIS — I63339 Cerebral infarction due to thrombosis of unspecified posterior cerebral artery: Secondary | ICD-10-CM | POA: Diagnosis not present

## 2022-01-27 DIAGNOSIS — I63339 Cerebral infarction due to thrombosis of unspecified posterior cerebral artery: Secondary | ICD-10-CM | POA: Diagnosis not present

## 2022-01-28 DIAGNOSIS — I63339 Cerebral infarction due to thrombosis of unspecified posterior cerebral artery: Secondary | ICD-10-CM | POA: Diagnosis not present

## 2022-01-29 ENCOUNTER — Encounter: Payer: Self-pay | Admitting: Family Medicine

## 2022-01-29 ENCOUNTER — Ambulatory Visit: Payer: Medicaid Other | Attending: Neurology

## 2022-01-29 ENCOUNTER — Ambulatory Visit (INDEPENDENT_AMBULATORY_CARE_PROVIDER_SITE_OTHER): Payer: Medicaid Other

## 2022-01-29 ENCOUNTER — Other Ambulatory Visit: Payer: Self-pay

## 2022-01-29 ENCOUNTER — Ambulatory Visit: Payer: Medicaid Other | Admitting: Family Medicine

## 2022-01-29 ENCOUNTER — Telehealth: Payer: Self-pay

## 2022-01-29 VITALS — BP 137/87 | HR 90 | Temp 97.8°F | Ht 73.0 in | Wt 199.0 lb

## 2022-01-29 DIAGNOSIS — N390 Urinary tract infection, site not specified: Secondary | ICD-10-CM

## 2022-01-29 DIAGNOSIS — R262 Difficulty in walking, not elsewhere classified: Secondary | ICD-10-CM | POA: Diagnosis present

## 2022-01-29 DIAGNOSIS — R2990 Unspecified symptoms and signs involving the nervous system: Secondary | ICD-10-CM | POA: Diagnosis not present

## 2022-01-29 DIAGNOSIS — I10 Essential (primary) hypertension: Secondary | ICD-10-CM | POA: Diagnosis not present

## 2022-01-29 DIAGNOSIS — R0781 Pleurodynia: Secondary | ICD-10-CM

## 2022-01-29 DIAGNOSIS — M6281 Muscle weakness (generalized): Secondary | ICD-10-CM | POA: Insufficient documentation

## 2022-01-29 DIAGNOSIS — Z09 Encounter for follow-up examination after completed treatment for conditions other than malignant neoplasm: Secondary | ICD-10-CM | POA: Diagnosis not present

## 2022-01-29 DIAGNOSIS — E782 Mixed hyperlipidemia: Secondary | ICD-10-CM | POA: Diagnosis not present

## 2022-01-29 DIAGNOSIS — N39498 Other specified urinary incontinence: Secondary | ICD-10-CM

## 2022-01-29 DIAGNOSIS — M940 Chondrocostal junction syndrome [Tietze]: Secondary | ICD-10-CM

## 2022-01-29 DIAGNOSIS — Z8673 Personal history of transient ischemic attack (TIA), and cerebral infarction without residual deficits: Secondary | ICD-10-CM | POA: Diagnosis not present

## 2022-01-29 DIAGNOSIS — I63339 Cerebral infarction due to thrombosis of unspecified posterior cerebral artery: Secondary | ICD-10-CM | POA: Diagnosis not present

## 2022-01-29 MED ORDER — PREDNISONE 20 MG PO TABS: 40.0000 mg | ORAL_TABLET | Freq: Every day | ORAL | 0 refills | Status: AC
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
## 2022-01-29 NOTE — Patient Outreach (Signed)
Received a red flag Emmi stroke notification for Ms. Gordner. I have assigned Roshanda Florance, RN to call for follow up and determine if there are any Case Management needs.    Everardo Voris, CBCS, CMAA [facility] Care Management Assistant [facility] Care Management [phone]  

## 2022-01-29 NOTE — Patient Outreach (Signed)
  Emmi Stroke Care Coordination Follow Up  01/29/2022 Name:  [name] MRN:  [id number] DOB:  [birth year]  Subjective: Alice [name] is a 57 y.o. year old female who is a primary care patient of Kelly, FNP   An Emmi alert was received  on 01/28/22 indicating patient responded to questions: Feeling worse overall? New or worsening pain/fever/shortness of breath? Smoked or been around smoke?.   I reached out by phone to follow up on the alert and spoke to Patient. Reviewed and addressed red alerts. She voices that she has been having "pain(non cardiac) with breathing in and when bending over." She voices that pain goes all around to her side and back. Patient saw PCP this morning for eval. She was told she had "inflammation in lungs" and has prescribed Prednisone for her to take. She voices that pharmacy delivers her meds and will deliver med this afternoon to her. She also voices that she was having pain/discomfort related to "not having a BM in over a week." She voices she took some Miralax and was able to have a BM yesterday. Patient also went for outpatient therapy today. She states therapy going well. She states that she has had a total of four strokes. Patient remains a smoker. However, she reports that she has decreased the amount from 2-3pks/day to about 10 or less cigarettes per day. She is trying to adhere to using Nicotine patch. Provided smoking cessation education and resource to patient. 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 Reviewed d/c instructions with patient and provided patient with MD contact info. She will call office and make an appt-denies needing assistance with scheduling appt. Patient voices that she uses [facility] transportation to get to her appts. She denies any further RN CM needs or concerns at this time.   Care Coordination  Interventions:  Yes, provided   Follow up plan: Advised patient that they would continue to get automated EMMI-Stroke post discharge calls to assess how they are doing following recent hospitalization and will receive a call from a nurse if any of their responses were abnormal. Patient voiced understanding and was appreciative of f/u call. Also, discussed with patient CM services available to her through Managed Medicaid program and she is interested and agreeable-will send referral.   Encounter Outcome:  Pt. Visit Completed   Enzo Montgomery, RN,BSN,CCM [facility] Telephonic Care Management Coordinator Direct Phone: [phone] Toll Free: [phone] Fax: [phone]

## 2022-01-29 NOTE — Therapy (Signed)
OUTPATIENT PHYSICAL THERAPY NEURO EVALUATION   Patient Name: Alice [name] MRN: [id number] DOB:[birth year], 57 y.o., female Today's Date: 01/29/2022   PCP: Baruch Gouty, FNP REFERRING PROVIDER: Rosalin [facility], MD   END OF SESSION:  PT End of Session - 01/29/22 1034     Visit Number 1    Number of Visits 10    Date for PT Re-Evaluation 04/09/22    PT Start Time 1031    PT Stop Time 1110    PT Time Calculation (min) 39 min    Activity Tolerance Patient tolerated treatment well    Behavior During Therapy [facility] for tasks assessed/performed             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   Qualifier: Diagnosis of  By: Delfino Lovett     Chronic low back pain    Cocaine abuse ([place])    COPD (chronic obstructive pulmonary disease) ([place])    Heart murmur    HLD (hyperlipidemia)    Hypertension    Stroke ([place])    x4   Past Surgical History:  Procedure Laterality Date   ABDOMINAL HYSTERECTOMY     abd?   BACK SURGERY     hardware in back   cyst removed from ovary     FRACTURE SURGERY     LUMBAR SPINE   removal of BB in finger  2008   SALIVARY GLAND SURGERY     tumor reomoved from stomach     Patient Active Problem List   Diagnosis Date Noted   Stroke-like symptom 01/17/2022   COPD  GOLD ? /  Group B still smoking 01/15/2022   Upper airway cough syndrome 01/14/2022   Vertigo 07/24/2021   Fall at home, initial encounter 07/24/2021   GERD (gastroesophageal reflux disease) 07/24/2021   TIA (transient ischemic attack) 05/31/2021   Full incontinence of feces 02/13/2021   Urinary incontinence 02/13/2021   Hospital discharge follow-up 12/12/2020   Depression, major, single episode, moderate ([place]) 10/22/2020   Chronic pain of right knee 11/01/2018   GAD (generalized anxiety disorder) 11/01/2018   Vitamin D deficiency 11/01/2018   Acute bacterial endocarditis 07/27/2018   Ataxia 09/08/2016   Dysarthria 09/08/2016   H/o Prior ischemic stroke ([place])  09/03/2016   Chronic hepatitis C virus infection ([place]) 05/09/2016   History of stroke 05/09/2016   History of substance abuse ([place]) 05/09/2016   Stroke-like symptoms 05/09/2016   Sinusitis 05/09/2016   Heart murmur 01/08/2016   IVDU (intravenous drug user) 01/08/2016   COPD with exacerbation ([place]) 10/31/2015   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   AKI (acute kidney injury) ([employer]) 10/04/2015   Dyslipidemia 10/04/2015   HTN (hypertension) 10/04/2015   Discitis 10/04/2015   Severe recurrent major depression without psychotic features ([place]) 05/08/2015   Muscular deconditioning 05/24/2013   Postlaminectomy syndrome, lumbar region 12/04/2012   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   Adverse drug reaction 06/17/2012   Anemia 06/17/2012   Chronic low back pain 06/17/2012   DDD (degenerative disc disease), lumbosacral 06/17/2012   Depressive disorder 06/17/2012   Insomnia 06/17/2012   Pain in soft tissues of limb 06/17/2012   Myalgia and myositis 06/17/2012   Thoracic or lumbosacral neuritis or radiculitis 06/17/2012   Sacroiliitis ([place]) 06/17/2012   Opioid use disorder, severe, dependence ([place]) 07/30/2011    Class: Acute   Precordial pain 02/18/2010   Cigarette smoker 12/05/2009   Anxiety state 12/05/2009   Palpitations 12/05/2009    ONSET  DATE: 01/17/22  REFERRING DIAG: Stroke-like symptoms   THERAPY DIAG:  Difficulty in walking, not elsewhere classified  Muscle weakness (generalized)  Rationale for Evaluation and Treatment: Rehabilitation  SUBJECTIVE:                                                                                                                                                                                             SUBJECTIVE STATEMENT: Patient reports that she had a stroke on 01/17/22. [facility] had [facility]. She was int the ICU for about 24 hours prior to be moved to another room. She was then discharged  on 01/21/22. She notes that she had a bad chest cold prior to her stroke and she is now hurting around her ribs into her back. She is unable to do her daily activities due to her pain.  Pt accompanied by: self  PERTINENT HISTORY: Hypertension, history of a TIA, history of multiple strokes, COPD, hepatitis C, chronic low back pain, current smoker, depression, and anxiety  PAIN:  Are you having pain? Yes: NPRS scale: 10/10 Pain location: ribs Pain description: stabbing Aggravating factors: bending over, cleaning, and daily activities Relieving factors: none  PRECAUTIONS: None  WEIGHT BEARING RESTRICTIONS: No  FALLS: Has patient fallen in last 6 months? No  LIVING ENVIRONMENT: Lives with: lives with their spouse; staying with her minister until her husband gets home on 02/06/22. Lives in: House/apartment Stairs: Yes: External: 2 steps; none Has following equipment at home: Walker - 4 wheeled  PLOF: Needs assistance with ADLs and Needs assistance with homemaking; she has a nurse that comes 3 hours per weekday and 2 hours per day on the weekends   PATIENT GOALS: improved function with her daily activities  OBJECTIVE:   DIAGNOSTIC FINDINGS: 01/18/22 brain MRI IMPRESSION: 1. No acute intracranial abnormality.   2. Essentially stable noncontrast MRI appearance of the brain since July, although vertex Superficial Siderosis and other scattered chronic cerebral blood products are more apparent on SWI this time vs GRE before. Appearance is nonspecific but does not rise to the level of amyloid angiopathy. Small chronic left parietal lobe infarct again noted.  COGNITION: Overall cognitive status: Within functional limits for tasks assessed   SENSATION: Patient reports numbness from her mid tibia into her right foot. Diminished sensation throughout RLE with no dermatomal pattern.    EDEMA:  No edema observed in upper or lower extremities  POSTURE: rounded shoulders, forward head,  decreased lumbar lordosis, increased thoracic kyphosis, and flexed trunk   LOWER EXTREMITY ROM: WFL for activities assessed  LOWER EXTREMITY MMT:  MMT Right Eval Left Eval  Hip flexion 3+/5 4-/5  Hip extension    Hip abduction    Hip adduction    Hip internal rotation    Hip external rotation    Knee flexion 4-/5 5/5  Knee extension 3+/5 4/5  Ankle dorsiflexion 3+/5 4-/5  Ankle plantarflexion    Ankle inversion    Ankle eversion    (Blank rows = not tested)  TRANSFERS: Assistive device utilized: Environmental consultant - 4 wheeled  Sit to stand: SBA and required upper extremity support from armrests Stand to sit: SBA and required upper extremity support from armrests for a slow and controlled movement  GAIT: Gait pattern: step through pattern, decreased step length- Right, decreased step length- Left, decreased stride length, decreased hip/knee flexion- Right, decreased hip/knee flexion- Left, Right foot flat, Left foot flat, knee flexed in stance- Right, knee flexed in stance- Left, trunk flexed, wide BOS, poor foot clearance- Right, and poor foot clearance- Left Assistive device utilized: Walker - 4 wheeled Level of assistance: Modified independence  FUNCTIONAL TESTS:  5 times sit to stand: 28.70 seconds; required upper extremity use for stand to sit and sit to stand transfers Timed up and go (TUG): 21.02 seconds with rolling walker  TODAY'S TREATMENT:                                                                                                                              DATE:     PATIENT EDUCATION: Education details: Plan of care, prognosis, and goals for therapy Person educated: Patient Education method: Explanation Education comprehension: verbalized understanding  HOME EXERCISE PROGRAM:   GOALS: Goals reviewed with patient? Yes  SHORT TERM GOALS: Target date: 02/12/22  Patient will be independent with her initial HEP. Baseline: Goal status: INITIAL  2.  Patient  will improve her 5 times sit to stand time to 23 seconds or less. Baseline:  Goal status: INITIAL  3.  Patient will improve her timed up and go to 17 seconds or less. Baseline:  Goal status: INITIAL  LONG TERM GOALS: Target date: 03/05/22  Patient will be independent with her advanced HEP. Baseline:  Goal status: INITIAL  2.  Patient will improve her 5 times sit to stand time to 15 seconds or less for improved lower extremity power. Baseline:  Goal status: INITIAL  3.  Patient will improve her timed up and go time to 13 seconds or less for improved safety. Baseline:  Goal status: INITIAL  ASSESSMENT:  CLINICAL IMPRESSION: Patient is a 57 y.o. female who was seen today for physical therapy evaluation and treatment following an admission into the hospital on 01/17/22 for stroke-like symptoms. She is at the elevated fall risk as evidenced by her lower extremity weakness and her elevated timed up and go and 5 times sit to stand times. She also exhibited significant gait deviations which inhibit her functional mobility.  Recommend that she continue with skilled physical therapy to address  her impairments to maximize her functional mobility and safety.  OBJECTIVE IMPAIRMENTS: Abnormal gait, decreased activity tolerance, decreased balance, decreased mobility, difficulty walking, decreased strength, hypomobility, impaired flexibility, impaired sensation, impaired tone, improper body mechanics, postural dysfunction, and pain.   ACTIVITY LIMITATIONS: carrying, lifting, bending, standing, squatting, stairs, transfers, and locomotion level  PARTICIPATION LIMITATIONS: meal prep, cleaning, laundry, shopping, and community activity  PERSONAL FACTORS: Fitness, Past/current experiences, Social background, Transportation, and 3+ comorbidities: Hypertension, history of a TIA, history of multiple strokes, COPD, hepatitis C, chronic low back pain, current smoker, depression, and anxiety  are also affecting  patient's functional outcome.   REHAB POTENTIAL: Fair    CLINICAL DECISION MAKING: Unstable/unpredictable  EVALUATION COMPLEXITY: High  PLAN:  PT FREQUENCY: 1-2x/week  PT DURATION: other: 5 weeks  PLANNED INTERVENTIONS: Therapeutic exercises, Therapeutic activity, Neuromuscular re-education, Balance training, Gait training, Patient/Family education, Self Care, Stair training, and Re-evaluation  PLAN FOR NEXT SESSION: NuStep, lower extremity strengthening, postural reeducation, and balance interventions   Granville Lewis, PT 01/29/2022, 1:27 PM

## 2022-01-30 DIAGNOSIS — I63339 Cerebral infarction due to thrombosis of unspecified posterior cerebral artery: Secondary | ICD-10-CM | POA: Diagnosis not present

## 2022-01-30 LAB — CMP14+EGFR
ALT: 9 IU/L (ref 0–32)
AST: 17 IU/L (ref 0–40)
Albumin/Globulin Ratio: 1.6 (ref 1.2–2.2)
Albumin: 4.6 g/dL (ref 3.8–4.9)
Alkaline Phosphatase: 126 IU/L — ABNORMAL HIGH (ref 44–121)
BUN/Creatinine Ratio: 13 (ref 9–23)
BUN: 12 mg/dL (ref 6–24)
Bilirubin Total: 0.3 mg/dL (ref 0.0–1.2)
CO2: 21 mmol/L (ref 20–29)
Calcium: 9.7 mg/dL (ref 8.7–10.2)
Chloride: 102 mmol/L (ref 96–106)
Creatinine, Ser: 0.92 mg/dL (ref 0.57–1.00)
Globulin, Total: 2.9 g/dL (ref 1.5–4.5)
Glucose: 96 mg/dL (ref 70–99)
Potassium: 4.9 mmol/L (ref 3.5–5.2)
Sodium: 141 mmol/L (ref 134–144)
Total Protein: 7.5 g/dL (ref 6.0–8.5)
eGFR: 73 mL/min/{1.73_m2} (ref >59)

## 2022-01-30 LAB — CBC WITH DIFFERENTIAL/PLATELET
Basophils Absolute: 0.1 10*3/uL (ref 0.0–0.2)
Basos: 1 %
EOS (ABSOLUTE): 0.2 10*3/uL (ref 0.0–0.4)
Eos: 3 %
Hematocrit: 37.2 % (ref 34.0–46.6)
Hemoglobin: 12.4 g/dL (ref 11.1–15.9)
Immature Grans (Abs): 0 10*3/uL (ref 0.0–0.1)
Immature Granulocytes: 0 %
Lymphocytes Absolute: 1.4 10*3/uL (ref 0.7–3.1)
Lymphs: 28 %
MCH: 30 pg (ref 26.6–33.0)
MCHC: 33.3 g/dL (ref 31.5–35.7)
MCV: 90 fL (ref 79–97)
Monocytes Absolute: 0.4 10*3/uL (ref 0.1–0.9)
Monocytes: 7 %
Neutrophils Absolute: 3.2 10*3/uL (ref 1.4–7.0)
Neutrophils: 61 %
Platelets: 272 10*3/uL (ref 150–450)
RBC: 4.13 x10E6/uL (ref 3.77–5.28)
RDW: 12 % (ref 11.7–15.4)
WBC: 5.2 10*3/uL (ref 3.4–10.8)

## 2022-01-30 LAB — THYROID PANEL WITH TSH
Free Thyroxine Index: 2.6 (ref 1.2–4.9)
T3 Uptake Ratio: 29 % (ref 24–39)
T4, Total: 9.1 ug/dL (ref 4.5–12.0)
TSH: 7.06 u[IU]/mL — ABNORMAL HIGH (ref 0.450–4.500)

## 2022-01-31 DIAGNOSIS — I63339 Cerebral infarction due to thrombosis of unspecified posterior cerebral artery: Secondary | ICD-10-CM | POA: Diagnosis not present

## 2022-01-31 LAB — URINE CULTURE

## 2022-02-01 DIAGNOSIS — I63339 Cerebral infarction due to thrombosis of unspecified posterior cerebral artery: Secondary | ICD-10-CM | POA: Diagnosis not present

## 2022-02-02 DIAGNOSIS — I63339 Cerebral infarction due to thrombosis of unspecified posterior cerebral artery: Secondary | ICD-10-CM | POA: Diagnosis not present

## 2022-02-03 DIAGNOSIS — I63339 Cerebral infarction due to thrombosis of unspecified posterior cerebral artery: Secondary | ICD-10-CM | POA: Diagnosis not present

## 2022-02-04 DIAGNOSIS — I63339 Cerebral infarction due to thrombosis of unspecified posterior cerebral artery: Secondary | ICD-10-CM | POA: Diagnosis not present

## 2022-02-05 ENCOUNTER — Telehealth: Payer: Self-pay

## 2022-02-05 ENCOUNTER — Ambulatory Visit: Payer: Medicaid Other | Admitting: Physical Therapy

## 2022-02-05 ENCOUNTER — Encounter: Payer: Self-pay | Admitting: Physical Therapy

## 2022-02-05 DIAGNOSIS — M6281 Muscle weakness (generalized): Secondary | ICD-10-CM

## 2022-02-05 DIAGNOSIS — R262 Difficulty in walking, not elsewhere classified: Secondary | ICD-10-CM | POA: Diagnosis not present

## 2022-02-05 DIAGNOSIS — I63339 Cerebral infarction due to thrombosis of unspecified posterior cerebral artery: Secondary | ICD-10-CM | POA: Diagnosis not present

## 2022-02-05 NOTE — Patient Outreach (Signed)
  Emmi Stroke Care Coordination Follow Up  02/05/2022 Name:  Alice [name] MRN:  [id number] DOB:  26-Aug-1965  Subjective: Alice [name] is a 57 y.o. year old female who is a primary care patient of [place], FNP   An Emmi alert was received on 02/04/22 indicating patient responded to questions: Questions/problems with meds?.   I reached out by phone to follow up on the alert and spoke to Patient. Patient reports she was doing okay but could not talk at length right now. States she is getting ready for an MD appt and running late. Requested call back at another time.   Care Coordination Interventions:  Yes, provided   Follow up plan:  RN CM will make outreach to patient again.    Encounter Outcome:  Pt. Request to Call Back   Enzo Montgomery, RN,BSN,CCM [place] [facility] Telephonic Care Management Coordinator Direct Phone: [phone] Toll Free: [phone] Fax: [phone]

## 2022-02-05 NOTE — Therapy (Signed)
OUTPATIENT PHYSICAL THERAPY NEURO TREATMENT   Patient Name: Alice [name] MRN: [id number] DOB:1965/12/27, 57 y.o., female Today's Date: 02/05/2022   PCP: Sonny Masters, FNP REFERRING PROVIDER: Marvel Plan, MD   END OF SESSION:  PT End of Session - 02/05/22 1026     Visit Number 2    Number of Visits 10    Date for PT Re-Evaluation 04/09/22    PT Start Time 1034    PT Stop Time 1104    PT Time Calculation (min) 30 min    Equipment Utilized During Treatment Other (comment)   Rollator   Activity Tolerance Patient limited by fatigue    Behavior During Therapy [facility] for tasks assessed/performed            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   BACK SURGERY     hardware in back   cyst removed from ovary     FRACTURE SURGERY     LUMBAR SPINE   removal of BB in finger  2008   SALIVARY GLAND SURGERY     tumor reomoved from stomach     Patient Active Problem List   Diagnosis Date Noted   Stroke-like symptom 01/17/2022   COPD  GOLD ? /  Group B still smoking 01/15/2022   Upper airway cough syndrome 01/14/2022   Vertigo 07/24/2021   Fall at home, initial encounter 07/24/2021   GERD (gastroesophageal reflux disease) 07/24/2021   TIA (transient ischemic attack) 05/31/2021   Full incontinence of feces 02/13/2021   Urinary incontinence 02/13/2021   Hospital discharge follow-up 12/12/2020   Depression, major, single episode, moderate ([facility]) 10/22/2020   Chronic pain of right knee 11/01/2018   GAD (generalized anxiety disorder) 11/01/2018   Vitamin D deficiency 11/01/2018   Acute bacterial endocarditis 07/27/2018   Ataxia 09/08/2016    Dysarthria 09/08/2016   H/o Prior ischemic stroke ([facility]) 09/03/2016   Chronic hepatitis C virus infection ([facility]) 05/09/2016   History of stroke 05/09/2016   History of substance abuse ([facility]) 05/09/2016   Stroke-like symptoms 05/09/2016   Sinusitis 05/09/2016   Heart murmur 01/08/2016   IVDU (intravenous drug user) 01/08/2016   COPD with exacerbation ([facility]) 10/31/2015   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   AKI (acute kidney injury) ([facility]) 10/04/2015   Dyslipidemia 10/04/2015   HTN (hypertension) 10/04/2015   Discitis 10/04/2015   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/08/2015   Muscular deconditioning 05/24/2013   Postlaminectomy syndrome, lumbar region 12/04/2012   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   Adverse drug reaction 06/17/2012   Anemia 06/17/2012   Chronic low back pain 06/17/2012   DDD (degenerative disc disease), lumbosacral 06/17/2012   Depressive disorder 06/17/2012   Insomnia 06/17/2012   Pain in soft tissues of limb 06/17/2012   Myalgia and myositis 06/17/2012   Thoracic or lumbosacral neuritis or radiculitis 06/17/2012   Sacroiliitis (HCC) 06/17/2012   Opioid use disorder, severe, dependence ([facility]) 07/30/2011    Class: Acute   Precordial pain 02/18/2010   Cigarette smoker 12/05/2009   Anxiety  state 12/05/2009   Palpitations 12/05/2009    ONSET DATE: 01/17/22  REFERRING DIAG: Stroke-like symptoms   THERAPY DIAG:  Difficulty in walking, not elsewhere classified  Muscle weakness (generalized)  Rationale for Evaluation and Treatment: Rehabilitation  SUBJECTIVE:                                                                                                                                                                                             SUBJECTIVE STATEMENT: Patient reports feeling generally weak since latest stroke hospital stay. States that rib pain was diagnosed as pleurisy. Reports R side is weaker in  LE. Pt accompanied by: self  PERTINENT HISTORY: Hypertension, history of a TIA, history of multiple strokes, COPD, hepatitis C, chronic low back pain, current smoker, depression, and anxiety  PAIN:  Are you having pain? Yes: NPRS scale: no pain score/10 Pain location: ribs Pain description: stabbing Aggravating factors: bending over, cleaning, and daily activities Relieving factors: none  PRECAUTIONS: None  WEIGHT BEARING RESTRICTIONS: No  FALLS: Has patient fallen in last 6 months? No  LIVING ENVIRONMENT: Lives with: lives with their spouse; staying with her minister until her husband gets home on 02/06/22. Lives in: House/apartment Stairs: Yes: External: 2 steps; none Has following equipment at home: Walker - 4 wheeled  PLOF: Needs assistance with ADLs and Needs assistance with homemaking; she has a nurse that comes 3 hours per weekday and 2 hours per day on the weekends   PATIENT GOALS: improved function with her daily activities  OBJECTIVE:   DIAGNOSTIC FINDINGS: 01/18/22 brain MRI IMPRESSION: 1. No acute intracranial abnormality.   2. Essentially stable noncontrast MRI appearance of the brain since July, although vertex Superficial Siderosis and other scattered chronic cerebral blood products are more apparent on SWI this time vs GRE before. Appearance is nonspecific but does not rise to the level of amyloid angiopathy. Small chronic left parietal lobe infarct again noted.   SENSATION: Patient reports numbness from her mid tibia into her right foot. Diminished sensation throughout RLE with no dermatomal pattern.    POSTURE: rounded shoulders, forward head, decreased lumbar lordosis, increased thoracic kyphosis, and flexed trunk   LOWER EXTREMITY MMT:    MMT Right Eval Left Eval  Hip flexion 3+/5 4-/5  Hip extension    Hip abduction    Hip adduction    Hip internal rotation    Hip external rotation    Knee flexion 4-/5 5/5  Knee extension 3+/5 4/5  Ankle  dorsiflexion 3+/5 4-/5  Ankle plantarflexion    Ankle inversion    Ankle eversion    (Blank rows = not  tested)  TRANSFERS: Assistive device utilized: Environmental consultant - 4 wheeled  Sit to stand: SBA and required upper extremity support from armrests Stand to sit: SBA and required upper extremity support from armrests for a slow and controlled movement  GAIT: Gait pattern: step through pattern, decreased step length- Right, decreased step length- Left, decreased stride length, decreased hip/knee flexion- Right, decreased hip/knee flexion- Left, Right foot flat, Left foot flat, knee flexed in stance- Right, knee flexed in stance- Left, trunk flexed, wide BOS, poor foot clearance- Right, and poor foot clearance- Left Assistive device utilized: Walker - 4 wheeled Level of assistance: Modified independence  FUNCTIONAL TESTS:  5 times sit to stand: 28.70 seconds; required upper extremity use for stand to sit and sit to stand transfers Timed up and go (TUG): 21.02 seconds with rolling walker  TODAY'S TREATMENT:                                                                                                                              DATE:                                     EXERCISE LOG  Exercise Repetitions and Resistance Comments  Nustep L2 x10 min   LAQ AROM x20 reps   Hip flexion X20 reps   Clam  Yellow x20 reps   Ball squeeze  X20 reps   Heel/toe raises X20 reps   W back Attempted but painful    Blank cell = exercise not performed today   PATIENT EDUCATION: Education details: Plan of care, prognosis, and goals for therapy Person educated: Patient Education method: Explanation Education comprehension: verbalized understanding  HOME EXERCISE PROGRAM:  GOALS: Goals reviewed with patient? Yes  SHORT TERM GOALS: Target date: 02/12/22  Patient will be independent with her initial HEP. Baseline: Goal status: INITIAL  2.  Patient will improve her 5 times sit to stand time to 23 seconds  or less. Baseline:  Goal status: INITIAL  3.  Patient will improve her timed up and go to 17 seconds or less. Baseline:  Goal status: INITIAL  LONG TERM GOALS: Target date: 03/05/22  Patient will be independent with her advanced HEP. Baseline:  Goal status: INITIAL  2.  Patient will improve her 5 times sit to stand time to 15 seconds or less for improved lower extremity power. Baseline:  Goal status: INITIAL  3.  Patient will improve her timed up and go time to 13 seconds or less for improved safety. Baseline:  Goal status: INITIAL  ASSESSMENT:  CLINICAL IMPRESSION: Patient presented in clinic with reports of generalized weakness following CVA. Patient still limited with UE movement due to pain with pleurisy. Patient able to tolerate mild seated LE strengthening but very fatigued. Patient encouraged to take her time with ADLs and not push through or rush until she can regain strength and tolerance  for activity.  OBJECTIVE IMPAIRMENTS: Abnormal gait, decreased activity tolerance, decreased balance, decreased mobility, difficulty walking, decreased strength, hypomobility, impaired flexibility, impaired sensation, impaired tone, improper body mechanics, postural dysfunction, and pain.   ACTIVITY LIMITATIONS: carrying, lifting, bending, standing, squatting, stairs, transfers, and locomotion level  PARTICIPATION LIMITATIONS: meal prep, cleaning, laundry, shopping, and community activity  PERSONAL FACTORS: Fitness, Past/current experiences, Social background, Transportation, and 3+ comorbidities: Hypertension, history of a TIA, history of multiple strokes, COPD, hepatitis C, chronic low back pain, current smoker, depression, and anxiety  are also affecting patient's functional outcome.   REHAB POTENTIAL: Fair    CLINICAL DECISION MAKING: Unstable/unpredictable  EVALUATION COMPLEXITY: High  PLAN:  PT FREQUENCY: 1-2x/week  PT DURATION: other: 5 weeks  PLANNED INTERVENTIONS:  Therapeutic exercises, Therapeutic activity, Neuromuscular re-education, Balance training, Gait training, Patient/Family education, Self Care, Stair training, and Re-evaluation  PLAN FOR NEXT SESSION: NuStep, lower extremity strengthening, postural reeducation, and balance interventions   Marvell Fuller, PTA 02/05/2022, 12:08 PM

## 2022-02-05 NOTE — Patient Outreach (Signed)
Received a red flag Emmi stroke notification for [name]. I have assigned Enzo Montgomery, RN to call for follow up and determine if there are any Case Management needs.    [facility], [place], [facility] [phone]

## 2022-02-05 NOTE — Patient Outreach (Signed)
  Emmi Stroke Care Coordination Follow Up  02/05/2022 Name:  Alice [name] MRN:  [id number] DOB:  [birth year]  Subjective: Alice [name] is a 57 y.o. year old female who is a primary care patient of [place], FNP   An Emmi alert was received on 02/04/22 indicating patient responded to questions: Questions/problems with meds?.   I reached out by phone to follow up on the alert and spoke to Patient. She voices she is resting-she went for her outpatient therapy appt earlier today. Reviewed and addressed red alert. Patient reports error in response-"machine misunderstood her." She denies any issues with meds. Patient has no RN CM needs or concerns at this time. Advised patient that she has completed EMMI-Stroke calls. She voiced understanding and appreciation.   Care Coordination Interventions:  Yes, provided   Follow up plan: No further intervention required. Patient has completed automated EMMI-STROKE post discharge calls.  Encounter Outcome:  Pt. Visit Completed   Enzo Montgomery, RN,BSN,CCM [place] [facility] Telephonic Care Management Coordinator Direct Phone: [phone] Toll Free: [phone] Fax: [phone]

## 2022-02-06 DIAGNOSIS — I63339 Cerebral infarction due to thrombosis of unspecified posterior cerebral artery: Secondary | ICD-10-CM | POA: Diagnosis not present

## 2022-02-07 DIAGNOSIS — I63339 Cerebral infarction due to thrombosis of unspecified posterior cerebral artery: Secondary | ICD-10-CM | POA: Diagnosis not present

## 2022-02-08 ENCOUNTER — Encounter: Payer: Self-pay | Admitting: Internal Medicine

## 2022-02-08 DIAGNOSIS — I63339 Cerebral infarction due to thrombosis of unspecified posterior cerebral artery: Secondary | ICD-10-CM | POA: Diagnosis not present

## 2022-02-08 NOTE — Progress Notes (Deleted)
Alice [name], female    DOB: 05-26-1965    MRN: [id number]   Brief patient profile:  38  yowf  active smoker  referred to pulmonary clinic in [place]  01/14/2022 by Alice [name]  for copd eval       History of Present Illness  01/14/2022  Pulmonary/ 1st [name] eval/ [name] / [place] on Anoro /ACEi / low dose ppi  Chief Complaint  Patient presents with   Consult    SOB/ COPD   Dyspnea:  baseline (oct 2023) still needing neb 3 x daily  Still housekeeping/ doe across the house front door to kitchen ride scooter to shop / walks bent over walking   Cough: nonp productive day > noct (as long as sleeps at 45 degrees) even when better still coughing but much worse since Nov 2023 with bilateral sym lat cp just with coughing fits   Sleep: much worse when try to sleep flat so using recliner x 45 degrees  SABA use: neb 3 x daily  02: none  Vax never vax/ once infected  Rec Pantoprazole (protonix) 40 mg   Take  30-60 min before first meal of the day and add  Pepcid (famotidine)  20 mg after supper until return to [name] - this is the best way to tell whether stomach acid is contributing to your problem.   Stop all smoking Stop lisinopil and anoro  Just use your nebulizer up every 4 hours as needed for cough or breathing problems  Diovan 160 (valsartan) one daily  -  ok to break in half if too strong Gabapentin 100 mg increased to 4 x daily  For cough > mucinex dm 1200 mg every 12 hours and avoid mint menthol chocolate  Please schedule a follow up [name] visit in 3 weeks, sooner if needed  with all  inhalers/ solutions in hand     02/09/2022  f/u ov/Alice [name]/Alice [name] re: *** maint on ***  No chief complaint on file.   Dyspnea:  *** Cough: *** Sleeping: *** SABA use: *** 02: *** Covid status: *** Lung cancer screening: ***   No obvious day to day or daytime variability or assoc excess/ purulent sputum or mucus plugs or hemoptysis or cp or chest tightness, subjective  wheeze or overt sinus or hb symptoms.   *** without nocturnal  or early am exacerbation  of respiratory  c/o's or need for noct saba. Also denies any obvious fluctuation of symptoms with weather or environmental changes or other aggravating or alleviating factors except as outlined above   No unusual exposure hx or h/o childhood pna/ asthma or knowledge of premature birth.  Current Allergies, Complete Past Medical History, Past Surgical History, Family History, and Social History were reviewed in [employer] record.  ROS  The following are not active complaints unless bolded Hoarseness, sore throat, dysphagia, dental problems, itching, sneezing,  nasal congestion or discharge of excess mucus or purulent secretions, ear ache,   fever, chills, sweats, unintended wt loss or wt gain, classically pleuritic or exertional cp,  orthopnea pnd or arm/hand swelling  or leg swelling, presyncope, palpitations, abdominal pain, anorexia, nausea, vomiting, diarrhea  or change in bowel habits or change in bladder habits, change in stools or change in urine, dysuria, hematuria,  rash, arthralgias, visual complaints, headache, numbness, weakness or ataxia or problems with walking or coordination,  change in mood or  memory.        No outpatient medications have been marked  as taking for the 02/09/22 encounter (Appointment) with [name], MD.               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   Qualifier: Diagnosis of  By: Delfino Lovett     Chronic low back pain    Cocaine abuse (HCC)    COPD (chronic obstructive pulmonary disease) ([place])    Heart murmur    HLD (hyperlipidemia)    Hypertension    Stroke ([place])    x4      Objective:    02/09/2022        ***   01/29/22 199 lb (90.3 kg)  01/17/22 175 lb 4.3 oz (79.5 kg)  01/14/22 195 lb 3.2 oz (88.5 kg)      Vital signs reviewed  02/09/2022  - Note at rest 02 sats  ***% on ***   General  appearance:    ***   edentulous     Min bar ***      I personally reviewed images and agree with radiology impression as follows:   Chest CTa    07/18/21  1. No evidence of pulmonary embolus. 2. Small area of atelectasis versus airspace consolidation in the lateral aspect of the lingula. 3. Possible mild interstitial pulmonary edema. 4. Moderate in size hiatal hernia with diffuse dilation of the esophagus. 5. Chronic thoracic spine fractures.     Assessment

## 2022-02-09 ENCOUNTER — Ambulatory Visit: Payer: Medicaid Other | Admitting: Internal Medicine

## 2022-02-09 DIAGNOSIS — I63339 Cerebral infarction due to thrombosis of unspecified posterior cerebral artery: Secondary | ICD-10-CM | POA: Diagnosis not present

## 2022-02-10 DIAGNOSIS — I63339 Cerebral infarction due to thrombosis of unspecified posterior cerebral artery: Secondary | ICD-10-CM | POA: Diagnosis not present

## 2022-02-11 DIAGNOSIS — I63339 Cerebral infarction due to thrombosis of unspecified posterior cerebral artery: Secondary | ICD-10-CM | POA: Diagnosis not present

## 2022-02-12 ENCOUNTER — Ambulatory Visit: Payer: Medicaid Other | Admitting: Physical Therapy

## 2022-02-12 DIAGNOSIS — I63339 Cerebral infarction due to thrombosis of unspecified posterior cerebral artery: Secondary | ICD-10-CM | POA: Diagnosis not present

## 2022-02-13 DIAGNOSIS — I63339 Cerebral infarction due to thrombosis of unspecified posterior cerebral artery: Secondary | ICD-10-CM | POA: Diagnosis not present

## 2022-02-14 DIAGNOSIS — I63339 Cerebral infarction due to thrombosis of unspecified posterior cerebral artery: Secondary | ICD-10-CM | POA: Diagnosis not present

## 2022-02-15 DIAGNOSIS — I63339 Cerebral infarction due to thrombosis of unspecified posterior cerebral artery: Secondary | ICD-10-CM | POA: Diagnosis not present

## 2022-02-16 DIAGNOSIS — I63339 Cerebral infarction due to thrombosis of unspecified posterior cerebral artery: Secondary | ICD-10-CM | POA: Diagnosis not present

## 2022-02-17 ENCOUNTER — Ambulatory Visit: Payer: Medicaid Other | Attending: Neurology

## 2022-02-17 DIAGNOSIS — I63339 Cerebral infarction due to thrombosis of unspecified posterior cerebral artery: Secondary | ICD-10-CM | POA: Diagnosis not present

## 2022-02-17 DIAGNOSIS — R262 Difficulty in walking, not elsewhere classified: Secondary | ICD-10-CM | POA: Insufficient documentation

## 2022-02-17 DIAGNOSIS — M6281 Muscle weakness (generalized): Secondary | ICD-10-CM | POA: Diagnosis present

## 2022-02-17 NOTE — Therapy (Signed)
OUTPATIENT PHYSICAL THERAPY NEURO TREATMENT   Patient Name: Alice [name] MRN: [id number] DOB:1965/02/14, 57 y.o., female Today's Date: 02/17/2022   PCP: Baruch Gouty, FNP REFERRING PROVIDER: Rosalin Hawking, MD   END OF SESSION:  PT End of Session - 02/17/22 1118     Visit Number 3    Number of Visits 10    Date for PT Re-Evaluation 04/09/22    PT Start Time 1115    Equipment Utilized During Treatment Other (comment)   Rollator   Activity Tolerance Patient limited by fatigue    Behavior During Therapy [facility] for tasks assessed/performed            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   Qualifier: Diagnosis of  By: Delfino Lovett     Chronic low back pain    Cocaine abuse (HCC)    COPD (chronic obstructive pulmonary disease) ([place])    Heart murmur    HLD (hyperlipidemia)    Hypertension    Stroke ([place])    x4   Past Surgical History:  Procedure Laterality Date   ABDOMINAL HYSTERECTOMY     abd?   BACK SURGERY     hardware in back   cyst removed from ovary     FRACTURE SURGERY     LUMBAR SPINE   removal of BB in finger  2008   SALIVARY GLAND SURGERY     tumor reomoved from stomach     Patient Active Problem List   Diagnosis Date Noted   Stroke-like symptom 01/17/2022   COPD  GOLD ? /  Group B still smoking 01/15/2022   Upper airway cough syndrome 01/14/2022   Vertigo 07/24/2021   Fall at home, initial encounter 07/24/2021   GERD (gastroesophageal reflux disease) 07/24/2021   TIA (transient ischemic attack) 05/31/2021   Full incontinence of feces 02/13/2021   Urinary incontinence 02/13/2021   Hospital discharge follow-up 12/12/2020   Depression, major, single episode, moderate ([place]) 10/22/2020   Chronic pain of right knee 11/01/2018   GAD (generalized anxiety disorder) 11/01/2018   Vitamin D deficiency 11/01/2018   Acute bacterial endocarditis 07/27/2018   Ataxia 09/08/2016   Dysarthria 09/08/2016   H/o Prior ischemic stroke ([place])  09/03/2016   Chronic hepatitis C virus infection ([place]) 05/09/2016   History of stroke 05/09/2016   History of substance abuse ([place]) 05/09/2016   Stroke-like symptoms 05/09/2016   Sinusitis 05/09/2016   Heart murmur 01/08/2016   IVDU (intravenous drug user) 01/08/2016   COPD with exacerbation ([place]) 10/31/2015   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   AKI (acute kidney injury) ([place]) 10/04/2015   Dyslipidemia 10/04/2015   HTN (hypertension) 10/04/2015   Discitis 10/04/2015   Severe recurrent major depression without psychotic features ([place]) 05/08/2015   Muscular deconditioning 05/24/2013   Postlaminectomy syndrome, lumbar region 12/04/2012   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   Adverse drug reaction 06/17/2012   Anemia 06/17/2012   Chronic low back pain 06/17/2012   DDD (degenerative disc disease), lumbosacral 06/17/2012   Depressive disorder 06/17/2012   Insomnia 06/17/2012   Pain in soft tissues of limb 06/17/2012   Myalgia and myositis 06/17/2012   Thoracic or lumbosacral neuritis or radiculitis 06/17/2012   Sacroiliitis ([place]) 06/17/2012   Opioid use disorder, severe, dependence ([place]) 07/30/2011    Class: Acute   Precordial pain 02/18/2010   Cigarette smoker 12/05/2009   Anxiety state 12/05/2009   Palpitations 12/05/2009    ONSET DATE: 01/17/22  REFERRING DIAG: Stroke-like  symptoms   THERAPY DIAG:  Difficulty in walking, not elsewhere classified  Muscle weakness (generalized)  Rationale for Evaluation and Treatment: Rehabilitation  SUBJECTIVE:                                                                                                                                                                                             SUBJECTIVE STATEMENT: Patient reports being rushed this morning by transportation and did not have time to grab her walker or even her coat.  Pt reports 9/10 low back pain today. Pt accompanied by: self  PERTINENT HISTORY:  Hypertension, history of a TIA, history of multiple strokes, COPD, hepatitis C, chronic low back pain, current smoker, depression, and anxiety  PAIN:  Are you having pain? Yes: NPRS scale: 9/10 Pain location: low back Pain description: stabbing Aggravating factors: bending over, cleaning, and daily activities Relieving factors: none  PRECAUTIONS: None  WEIGHT BEARING RESTRICTIONS: No  FALLS: Has patient fallen in last 6 months? No  LIVING ENVIRONMENT: Lives with: lives with their spouse; staying with her minister until her husband gets home on 02/06/22. Lives in: House/apartment Stairs: Yes: External: 2 steps; none Has following equipment at home: Walker - 4 wheeled  PLOF: Needs assistance with ADLs and Needs assistance with homemaking; she has a nurse that comes 3 hours per weekday and 2 hours per day on the weekends   PATIENT GOALS: improved function with her daily activities  OBJECTIVE:   DIAGNOSTIC FINDINGS: 01/18/22 brain MRI IMPRESSION: 1. No acute intracranial abnormality.   2. Essentially stable noncontrast MRI appearance of the brain since July, although vertex Superficial Siderosis and other scattered chronic cerebral blood products are more apparent on SWI this time vs GRE before. Appearance is nonspecific but does not rise to the level of amyloid angiopathy. Small chronic left parietal lobe infarct again noted.   SENSATION: Patient reports numbness from her mid tibia into her right foot. Diminished sensation throughout RLE with no dermatomal pattern.    POSTURE: rounded shoulders, forward head, decreased lumbar lordosis, increased thoracic kyphosis, and flexed trunk   LOWER EXTREMITY MMT:    MMT Right Eval Left Eval  Hip flexion 3+/5 4-/5  Hip extension    Hip abduction    Hip adduction    Hip internal rotation    Hip external rotation    Knee flexion 4-/5 5/5  Knee extension 3+/5 4/5  Ankle dorsiflexion 3+/5 4-/5  Ankle plantarflexion    Ankle  inversion    Ankle eversion    (Blank rows = not tested)  TRANSFERS: Assistive device utilized: Environmental consultant - 4 wheeled  Sit to  stand: SBA and required upper extremity support from armrests Stand to sit: SBA and required upper extremity support from armrests for a slow and controlled movement  GAIT: Gait pattern: step through pattern, decreased step length- Right, decreased step length- Left, decreased stride length, decreased hip/knee flexion- Right, decreased hip/knee flexion- Left, Right foot flat, Left foot flat, knee flexed in stance- Right, knee flexed in stance- Left, trunk flexed, wide BOS, poor foot clearance- Right, and poor foot clearance- Left Assistive device utilized: Walker - 4 wheeled Level of assistance: Modified independence  FUNCTIONAL TESTS:  5 times sit to stand: 28.70 seconds; required upper extremity use for stand to sit and sit to stand transfers Timed up and go (TUG): 21.02 seconds with rolling walker  TODAY'S TREATMENT:                                                                                                                              DATE:                                     EXERCISE LOG  Exercise Repetitions and Resistance Comments  Nustep L3 x12 min   LAQ 2# x15 reps   Seated Marches 2# x15 reps   Clam  Yellow x30 reps   Ball squeeze  X30 reps   Ham Curls Yellow x 25 reps bil   Heel/toe raises X2 mins   W back Attempted but painful   Rows Yellow x 20 reps   Extensions Yellow x 20 reps    Horizontal Abduction Yellow x 20 reps   STS     Blank cell = exercise not performed today   PATIENT EDUCATION: Education details: Plan of care, prognosis, and goals for therapy Person educated: Patient Education method: Explanation Education comprehension: verbalized understanding  HOME EXERCISE PROGRAM:  GOALS: Goals reviewed with patient? Yes  SHORT TERM GOALS: Target date: 02/12/22  Patient will be independent with her initial HEP. Baseline: Goal  status: INITIAL  2.  Patient will improve her 5 times sit to stand time to 23 seconds or less. Baseline:  Goal status: INITIAL  3.  Patient will improve her timed up and go to 17 seconds or less. Baseline:  Goal status: INITIAL  LONG TERM GOALS: Target date: 03/05/22  Patient will be independent with her advanced HEP. Baseline:  Goal status: INITIAL  2.  Patient will improve her 5 times sit to stand time to 15 seconds or less for improved lower extremity power. Baseline:  Goal status: INITIAL  3.  Patient will improve her timed up and go time to 13 seconds or less for improved safety. Baseline:  Goal status: INITIAL  ASSESSMENT:  CLINICAL IMPRESSION: Pt arrives for today's treatment session reporting 9/10 low back pain. Pt able to tolerate increased time and resistance on Nustep today with fatigue noted.  Pt also able to  tolerate increased reps and/or resistance with all exercises today.  Pt requiring min cues for proper posture and technique with numerous exercises today.  Pt denied any increase in pain at completion of today's treatment session, but does note increased fatigue.    OBJECTIVE IMPAIRMENTS: Abnormal gait, decreased activity tolerance, decreased balance, decreased mobility, difficulty walking, decreased strength, hypomobility, impaired flexibility, impaired sensation, impaired tone, improper body mechanics, postural dysfunction, and pain.   ACTIVITY LIMITATIONS: carrying, lifting, bending, standing, squatting, stairs, transfers, and locomotion level  PARTICIPATION LIMITATIONS: meal prep, cleaning, laundry, shopping, and community activity  PERSONAL FACTORS: Fitness, Past/current experiences, Social background, Transportation, and 3+ comorbidities: Hypertension, history of a TIA, history of multiple strokes, COPD, hepatitis C, chronic low back pain, current smoker, depression, and anxiety  are also affecting patient's functional outcome.   REHAB POTENTIAL: Fair     CLINICAL DECISION MAKING: Unstable/unpredictable  EVALUATION COMPLEXITY: High  PLAN:  PT FREQUENCY: 1-2x/week  PT DURATION: other: 5 weeks  PLANNED INTERVENTIONS: Therapeutic exercises, Therapeutic activity, Neuromuscular re-education, Balance training, Gait training, Patient/Family education, Self Care, Stair training, and Re-evaluation  PLAN FOR NEXT SESSION: NuStep, lower extremity strengthening, postural reeducation, and balance interventions   Kathrynn Ducking, PTA 02/17/2022, 11:18 AM

## 2022-02-18 DIAGNOSIS — I63339 Cerebral infarction due to thrombosis of unspecified posterior cerebral artery: Secondary | ICD-10-CM | POA: Diagnosis not present

## 2022-02-19 ENCOUNTER — Ambulatory Visit: Payer: Medicaid Other

## 2022-02-19 DIAGNOSIS — R262 Difficulty in walking, not elsewhere classified: Secondary | ICD-10-CM

## 2022-02-19 DIAGNOSIS — M6281 Muscle weakness (generalized): Secondary | ICD-10-CM

## 2022-02-19 DIAGNOSIS — I63339 Cerebral infarction due to thrombosis of unspecified posterior cerebral artery: Secondary | ICD-10-CM | POA: Diagnosis not present

## 2022-02-19 NOTE — Therapy (Signed)
OUTPATIENT PHYSICAL THERAPY NEURO TREATMENT   Patient Name: Alice [name] MRN: [id number] DOB:[birth year], 57 y.o., female Today's Date: 02/19/2022   PCP: Baruch Gouty, FNP REFERRING PROVIDER: Rosalin Hawking, MD   END OF SESSION:  PT End of Session - 02/19/22 1124     Visit Number 4    Number of Visits 10    Date for PT Re-Evaluation 04/09/22    PT Start Time 1115    PT Stop Time 1152    PT Time Calculation (min) 37 min    Equipment Utilized During Treatment Other (comment)   Rollator   Activity Tolerance Patient limited by fatigue    Behavior During Therapy [facility] for tasks assessed/performed            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   Qualifier: Diagnosis of  By: Delfino Lovett     Chronic low back pain    Cocaine abuse (HCC)    COPD (chronic obstructive pulmonary disease) ([place])    Heart murmur    HLD (hyperlipidemia)    Hypertension    Stroke ([place])    x4   Past Surgical History:  Procedure Laterality Date   ABDOMINAL HYSTERECTOMY     abd?   BACK SURGERY     hardware in back   cyst removed from ovary     FRACTURE SURGERY     LUMBAR SPINE   removal of BB in finger  2008   SALIVARY GLAND SURGERY     tumor reomoved from stomach     Patient Active Problem List   Diagnosis Date Noted   Stroke-like symptom 01/17/2022   COPD  GOLD ? /  Group B still smoking 01/15/2022   Upper airway cough syndrome 01/14/2022   Vertigo 07/24/2021   Fall at home, initial encounter 07/24/2021   GERD (gastroesophageal reflux disease) 07/24/2021   TIA (transient ischemic attack) 05/31/2021   Full incontinence of feces 02/13/2021   Urinary incontinence 02/13/2021   Hospital discharge follow-up 12/12/2020   Depression, major, single episode, moderate ([place]) 10/22/2020   Chronic pain of right knee 11/01/2018   GAD (generalized anxiety disorder) 11/01/2018   Vitamin D deficiency 11/01/2018   Acute bacterial endocarditis 07/27/2018   Ataxia 09/08/2016    Dysarthria 09/08/2016   H/o Prior ischemic stroke ([place]) 09/03/2016   Chronic hepatitis C virus infection ([place]) 05/09/2016   History of stroke 05/09/2016   History of substance abuse ([place]) 05/09/2016   Stroke-like symptoms 05/09/2016   Sinusitis 05/09/2016   Heart murmur 01/08/2016   IVDU (intravenous drug user) 01/08/2016   COPD with exacerbation ([place]) 10/31/2015   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   AKI (acute kidney injury) ([place]) 10/04/2015   Dyslipidemia 10/04/2015   HTN (hypertension) 10/04/2015   Discitis 10/04/2015   Severe recurrent major depression without psychotic features ([place]) 05/08/2015   Muscular deconditioning 05/24/2013   Postlaminectomy syndrome, lumbar region 12/04/2012   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   Adverse drug reaction 06/17/2012   Anemia 06/17/2012   Chronic low back pain 06/17/2012   DDD (degenerative disc disease), lumbosacral 06/17/2012   Depressive disorder 06/17/2012   Insomnia 06/17/2012   Pain in soft tissues of limb 06/17/2012   Myalgia and myositis 06/17/2012   Thoracic or lumbosacral neuritis or radiculitis 06/17/2012   Sacroiliitis ([place]) 06/17/2012   Opioid use disorder, severe, dependence ([place]) 07/30/2011    Class: Acute   Precordial pain 02/18/2010   Cigarette smoker 12/05/2009   Anxiety  state 12/05/2009   Palpitations 12/05/2009    ONSET DATE: 01/17/22  REFERRING DIAG: Stroke-like symptoms   THERAPY DIAG:  Difficulty in walking, not elsewhere classified  Muscle weakness (generalized)  Rationale for Evaluation and Treatment: Rehabilitation  SUBJECTIVE:                                                                                                                                                                                             SUBJECTIVE STATEMENT: Patient reports that she feels alright today. She notes that she felt like her last appointment was "killing her." She notes that she has been having  cramps with getting up at night.  Pt accompanied by: self  PERTINENT HISTORY: Hypertension, history of a TIA, history of multiple strokes, COPD, hepatitis C, chronic low back pain, current smoker, depression, and anxiety  PAIN:  Are you having pain? Yes: NPRS scale: 7/10 Pain location: low back Pain description: stabbing Aggravating factors: bending over, cleaning, and daily activities Relieving factors: none  PRECAUTIONS: None  WEIGHT BEARING RESTRICTIONS: No  FALLS: Has patient fallen in last 6 months? No  LIVING ENVIRONMENT: Lives with: lives with their spouse; staying with her minister until her husband gets home on 02/06/22. Lives in: House/apartment Stairs: Yes: External: 2 steps; none Has following equipment at home: Walker - 4 wheeled  PLOF: Needs assistance with ADLs and Needs assistance with homemaking; she has a nurse that comes 3 hours per weekday and 2 hours per day on the weekends   PATIENT GOALS: improved function with her daily activities  OBJECTIVE:   DIAGNOSTIC FINDINGS: 01/18/22 brain MRI IMPRESSION: 1. No acute intracranial abnormality.   2. Essentially stable noncontrast MRI appearance of the brain since July, although vertex Superficial Siderosis and other scattered chronic cerebral blood products are more apparent on SWI this time vs GRE before. Appearance is nonspecific but does not rise to the level of amyloid angiopathy. Small chronic left parietal lobe infarct again noted.   SENSATION: Patient reports numbness from her mid tibia into her right foot. Diminished sensation throughout RLE with no dermatomal pattern.    POSTURE: rounded shoulders, forward head, decreased lumbar lordosis, increased thoracic kyphosis, and flexed trunk   LOWER EXTREMITY MMT:    MMT Right Eval Left Eval  Hip flexion 3+/5 4-/5  Hip extension    Hip abduction    Hip adduction    Hip internal rotation    Hip external rotation    Knee flexion 4-/5 5/5  Knee  extension 3+/5 4/5  Ankle dorsiflexion 3+/5 4-/5  Ankle plantarflexion    Ankle inversion    Ankle eversion    (  Blank rows = not tested)  TRANSFERS: Assistive device utilized: Environmental consultant - 4 wheeled  Sit to stand: SBA and required upper extremity support from armrests Stand to sit: SBA and required upper extremity support from armrests for a slow and controlled movement  GAIT: Gait pattern: step through pattern, decreased step length- Right, decreased step length- Left, decreased stride length, decreased hip/knee flexion- Right, decreased hip/knee flexion- Left, Right foot flat, Left foot flat, knee flexed in stance- Right, knee flexed in stance- Left, trunk flexed, wide BOS, poor foot clearance- Right, and poor foot clearance- Left Assistive device utilized: Walker - 4 wheeled Level of assistance: Modified independence  FUNCTIONAL TESTS:  5 times sit to stand: 28.70 seconds; required upper extremity use for stand to sit and sit to stand transfers Timed up and go (TUG): 21.02 seconds with rolling walker  TODAY'S TREATMENT:                                                                                                                              DATE:                                     2/9 EXERCISE LOG  Exercise Repetitions and Resistance Comments  Nustep  L3 x 10 minutes   Rocker board (seated)  4 minutes    LAQ 25 reps each    Seated marching  25 reps each    Seated clams  25 reps    Seated hip ADD isometric  25 reps w/ 5 second hold   Slouch/ overcorrect 30 reps  For postural reeducation   Scapular retraction  20 reps  For postural reeducation   Reaching overhead  15 reps  For postural reeducation    Blank cell = exercise not performed today                                    2/7 EXERCISE LOG  Exercise Repetitions and Resistance Comments  Nustep L3 x12 min   LAQ 2# x15 reps   Seated Marches 2# x15 reps   Clam  Yellow x30 reps   Ball squeeze  X30 reps   Ham Curls Yellow x  25 reps bil   Heel/toe raises X2 mins   W back Attempted but painful   Rows Yellow x 20 reps   Extensions Yellow x 20 reps    Horizontal Abduction Yellow x 20 reps   STS     Blank cell = exercise not performed today   PATIENT EDUCATION: Education details: Plan of care, prognosis, and goals for therapy Person educated: Patient Education method: Explanation Education comprehension: verbalized understanding  HOME EXERCISE PROGRAM:  GOALS: Goals reviewed with patient? Yes  SHORT TERM GOALS: Target date: 02/12/22  Patient will be independent with her initial HEP.  Baseline: Goal status: INITIAL  2.  Patient will improve her 5 times sit to stand time to 23 seconds or less. Baseline:  Goal status: INITIAL  3.  Patient will improve her timed up and go to 17 seconds or less. Baseline:  Goal status: INITIAL  LONG TERM GOALS: Target date: 03/05/22  Patient will be independent with her advanced HEP. Baseline:  Goal status: INITIAL  2.  Patient will improve her 5 times sit to stand time to 15 seconds or less for improved lower extremity power. Baseline:  Goal status: INITIAL  3.  Patient will improve her timed up and go time to 13 seconds or less for improved safety. Baseline:  Goal status: INITIAL  ASSESSMENT:  CLINICAL IMPRESSION: Treatment focused on new and familiar interventions for postural reeducation and lower extremity strengthening. She required minimal cueing with today's interventions for postural reeducation to facilitate upright posture. Fatigue was her primary limitation with today's interventions as she was unable to complete a full treatment. She reported feeling tired upon the conclusion of treatment. She continues to require skilled physical therapy to address her remaining impairments to maximize her safety and functional mobility.   OBJECTIVE IMPAIRMENTS: Abnormal gait, decreased activity tolerance, decreased balance, decreased mobility, difficulty walking,  decreased strength, hypomobility, impaired flexibility, impaired sensation, impaired tone, improper body mechanics, postural dysfunction, and pain.   ACTIVITY LIMITATIONS: carrying, lifting, bending, standing, squatting, stairs, transfers, and locomotion level  PARTICIPATION LIMITATIONS: meal prep, cleaning, laundry, shopping, and community activity  PERSONAL FACTORS: Fitness, Past/current experiences, Social background, Transportation, and 3+ comorbidities: Hypertension, history of a TIA, history of multiple strokes, COPD, hepatitis C, chronic low back pain, current smoker, depression, and anxiety  are also affecting patient's functional outcome.   REHAB POTENTIAL: Fair    CLINICAL DECISION MAKING: Unstable/unpredictable  EVALUATION COMPLEXITY: High  PLAN:  PT FREQUENCY: 1-2x/week  PT DURATION: other: 5 weeks  PLANNED INTERVENTIONS: Therapeutic exercises, Therapeutic activity, Neuromuscular re-education, Balance training, Gait training, Patient/Family education, Self Care, Stair training, and Re-evaluation  PLAN FOR NEXT SESSION: NuStep, lower extremity strengthening, postural reeducation, and balance interventions   Darlin Coco, PT 02/19/2022, 12:09 PM

## 2022-02-20 DIAGNOSIS — I63339 Cerebral infarction due to thrombosis of unspecified posterior cerebral artery: Secondary | ICD-10-CM | POA: Diagnosis not present

## 2022-02-21 DIAGNOSIS — I63339 Cerebral infarction due to thrombosis of unspecified posterior cerebral artery: Secondary | ICD-10-CM | POA: Diagnosis not present

## 2022-02-22 ENCOUNTER — Telehealth: Payer: Self-pay

## 2022-02-22 ENCOUNTER — Other Ambulatory Visit (HOSPITAL_COMMUNITY): Payer: Self-pay

## 2022-02-22 DIAGNOSIS — I63339 Cerebral infarction due to thrombosis of unspecified posterior cerebral artery: Secondary | ICD-10-CM | POA: Diagnosis not present

## 2022-02-22 NOTE — Patient Outreach (Signed)
BSW receieved a telephone call from patient stating that she is getting married this week and wanted to know if she needed to report it to her Psychologist, sport and exercise. BSW informed patient she would have to report it due to it being a change in her household. Patient understood. No resources or services needed at this time. Patient stated she would contact BSW if she needed anything.   Mickel Fuchs, BSW, [place] [employer]aged Medicaid Team  [phone]

## 2022-02-23 ENCOUNTER — Ambulatory Visit: Payer: Medicaid Other | Admitting: *Deleted

## 2022-02-23 DIAGNOSIS — I63339 Cerebral infarction due to thrombosis of unspecified posterior cerebral artery: Secondary | ICD-10-CM | POA: Diagnosis not present

## 2022-02-24 DIAGNOSIS — I63339 Cerebral infarction due to thrombosis of unspecified posterior cerebral artery: Secondary | ICD-10-CM | POA: Diagnosis not present

## 2022-02-25 DIAGNOSIS — I63339 Cerebral infarction due to thrombosis of unspecified posterior cerebral artery: Secondary | ICD-10-CM | POA: Diagnosis not present

## 2022-02-26 ENCOUNTER — Encounter: Payer: Self-pay | Admitting: *Deleted

## 2022-02-26 ENCOUNTER — Other Ambulatory Visit: Payer: Medicaid Other | Admitting: *Deleted

## 2022-02-26 DIAGNOSIS — I63339 Cerebral infarction due to thrombosis of unspecified posterior cerebral artery: Secondary | ICD-10-CM | POA: Diagnosis not present

## 2022-02-26 NOTE — Therapy (Incomplete)
OUTPATIENT PHYSICAL THERAPY NEURO TREATMENT   Patient Name: Alice [name] MRN: [id number] DOB:06-24-1965, 57 y.o., female Today's Date: 02/26/2022   PCP: Baruch Gouty, FNP REFERRING PROVIDER: Rosalin Hawking, MD   END OF SESSION:   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   Qualifier: Diagnosis of  By: Delfino Lovett     Chronic low back pain    Cocaine abuse (HCC)    COPD (chronic obstructive pulmonary disease) ([place])    Heart murmur    HLD (hyperlipidemia)    Hypertension    Stroke ([place])    x4   Past Surgical History:  Procedure Laterality Date   ABDOMINAL HYSTERECTOMY     abd?   BACK SURGERY     hardware in back   cyst removed from ovary     FRACTURE SURGERY     LUMBAR SPINE   removal of BB in finger  2008   SALIVARY GLAND SURGERY     tumor reomoved from stomach     Patient Active Problem List   Diagnosis Date Noted   Stroke-like symptom 01/17/2022   COPD  GOLD ? /  Group B still smoking 01/15/2022   Upper airway cough syndrome 01/14/2022   Vertigo 07/24/2021   Fall at home, initial encounter 07/24/2021   GERD (gastroesophageal reflux disease) 07/24/2021   TIA (transient ischemic attack) 05/31/2021   Full incontinence of feces 02/13/2021   Urinary incontinence 02/13/2021   Hospital discharge follow-up 12/12/2020   Depression, major, single episode, moderate ([place]) 10/22/2020   Chronic pain of right knee 11/01/2018   GAD (generalized anxiety disorder) 11/01/2018   Vitamin D deficiency 11/01/2018   Acute bacterial endocarditis 07/27/2018   Ataxia 09/08/2016   Dysarthria 09/08/2016   H/o Prior ischemic stroke ([place]) 09/03/2016   Chronic hepatitis C virus infection ([place]) 05/09/2016   History of stroke 05/09/2016   History of substance abuse ([place]) 05/09/2016   Stroke-like symptoms 05/09/2016   Sinusitis 05/09/2016   Heart murmur 01/08/2016   IVDU (intravenous drug user) 01/08/2016   COPD with exacerbation ([place]) 10/31/2015   Late effects of  cerebral ischemic stroke 10/04/2015   AKI (acute kidney injury) ([place]) 10/04/2015   Dyslipidemia 10/04/2015   HTN (hypertension) 10/04/2015   Discitis 10/04/2015   Severe recurrent major depression without psychotic features ([place]) 05/08/2015   Muscular deconditioning 05/24/2013   Postlaminectomy syndrome, lumbar region 12/04/2012   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   Adverse drug reaction 06/17/2012   Anemia 06/17/2012   Chronic low back pain 06/17/2012   DDD (degenerative disc disease), lumbosacral 06/17/2012   Depressive disorder 06/17/2012   Insomnia 06/17/2012   Pain in soft tissues of limb 06/17/2012   Myalgia and myositis 06/17/2012   Thoracic or lumbosacral neuritis or radiculitis 06/17/2012   Sacroiliitis ([place]) 06/17/2012   Opioid use disorder, severe, dependence ([place]) 07/30/2011    Class: Acute   Precordial pain 02/18/2010   Cigarette smoker 12/05/2009   Anxiety state 12/05/2009   Palpitations 12/05/2009    ONSET DATE: 01/17/22  REFERRING DIAG: Stroke-like symptoms   THERAPY DIAG:  No diagnosis found.  Rationale for Evaluation and Treatment: Rehabilitation  SUBJECTIVE:  SUBJECTIVE STATEMENT: *** Pt accompanied by: self  PERTINENT HISTORY: Hypertension, history of a TIA, history of multiple strokes, COPD, hepatitis C, chronic low back pain, current smoker, depression, and anxiety  PAIN:  Are you having pain? Yes: NPRS scale: 7/10 Pain location: low back Pain description: stabbing Aggravating factors: bending over, cleaning, and daily activities Relieving factors: none  PRECAUTIONS: None  WEIGHT BEARING RESTRICTIONS: No  FALLS: Has patient fallen in last 6 months? No  LIVING ENVIRONMENT: Lives with: lives with their spouse; staying with her minister until her husband  gets home on 02/06/22. Lives in: House/apartment Stairs: Yes: External: 2 steps; none Has following equipment at home: Walker - 4 wheeled  PLOF: Needs assistance with ADLs and Needs assistance with homemaking; she has a nurse that comes 3 hours per weekday and 2 hours per day on the weekends   PATIENT GOALS: improved function with her daily activities  OBJECTIVE:   DIAGNOSTIC FINDINGS: 01/18/22 brain MRI IMPRESSION: 1. No acute intracranial abnormality.   2. Essentially stable noncontrast MRI appearance of the brain since July, although vertex Superficial Siderosis and other scattered chronic cerebral blood products are more apparent on SWI this time vs GRE before. Appearance is nonspecific but does not rise to the level of amyloid angiopathy. Small chronic left parietal lobe infarct again noted.   SENSATION: Patient reports numbness from her mid tibia into her right foot. Diminished sensation throughout RLE with no dermatomal pattern.    POSTURE: rounded shoulders, forward head, decreased lumbar lordosis, increased thoracic kyphosis, and flexed trunk   LOWER EXTREMITY MMT:    MMT Right Eval Left Eval  Hip flexion 3+/5 4-/5  Hip extension    Hip abduction    Hip adduction    Hip internal rotation    Hip external rotation    Knee flexion 4-/5 5/5  Knee extension 3+/5 4/5  Ankle dorsiflexion 3+/5 4-/5  Ankle plantarflexion    Ankle inversion    Ankle eversion    (Blank rows = not tested)  TRANSFERS: Assistive device utilized: Environmental consultant - 4 wheeled  Sit to stand: SBA and required upper extremity support from armrests Stand to sit: SBA and required upper extremity support from armrests for a slow and controlled movement  GAIT: Gait pattern: step through pattern, decreased step length- Right, decreased step length- Left, decreased stride length, decreased hip/knee flexion- Right, decreased hip/knee flexion- Left, Right foot flat, Left foot flat, knee flexed in stance- Right,  knee flexed in stance- Left, trunk flexed, wide BOS, poor foot clearance- Right, and poor foot clearance- Left Assistive device utilized: Walker - 4 wheeled Level of assistance: Modified independence  FUNCTIONAL TESTS:  5 times sit to stand: 28.70 seconds; required upper extremity use for stand to sit and sit to stand transfers Timed up and go (TUG): 21.02 seconds with rolling walker  TODAY'S TREATMENT:  DATE:                                     2/16 EXERCISE LOG  Exercise Repetitions and Resistance Comments                       Blank cell = exercise not performed today                                    2/9 EXERCISE LOG  Exercise Repetitions and Resistance Comments  Nustep  L3 x 10 minutes   Rocker board (seated)  4 minutes    LAQ 25 reps each    Seated marching  25 reps each    Seated clams  25 reps    Seated hip ADD isometric  25 reps w/ 5 second hold   Slouch/ overcorrect 30 reps  For postural reeducation   Scapular retraction  20 reps  For postural reeducation   Reaching overhead  15 reps  For postural reeducation    Blank cell = exercise not performed today                                    2/7 EXERCISE LOG  Exercise Repetitions and Resistance Comments  Nustep L3 x12 min   LAQ 2# x15 reps   Seated Marches 2# x15 reps   Clam  Yellow x30 reps   Ball squeeze  X30 reps   Ham Curls Yellow x 25 reps bil   Heel/toe raises X2 mins   W back Attempted but painful   Rows Yellow x 20 reps   Extensions Yellow x 20 reps    Horizontal Abduction Yellow x 20 reps   STS     Blank cell = exercise not performed today   PATIENT EDUCATION: Education details: Plan of care, prognosis, and goals for therapy Person educated: Patient Education method: Explanation Education comprehension: verbalized understanding  HOME EXERCISE PROGRAM:  GOALS: Goals  reviewed with patient? Yes  SHORT TERM GOALS: Target date: 02/12/22  Patient will be independent with her initial HEP. Baseline: Goal status: INITIAL  2.  Patient will improve her 5 times sit to stand time to 23 seconds or less. Baseline:  Goal status: INITIAL  3.  Patient will improve her timed up and go to 17 seconds or less. Baseline:  Goal status: INITIAL  LONG TERM GOALS: Target date: 03/05/22  Patient will be independent with her advanced HEP. Baseline:  Goal status: INITIAL  2.  Patient will improve her 5 times sit to stand time to 15 seconds or less for improved lower extremity power. Baseline:  Goal status: INITIAL  3.  Patient will improve her timed up and go time to 13 seconds or less for improved safety. Baseline:  Goal status: INITIAL  ASSESSMENT:  CLINICAL IMPRESSION: ***  OBJECTIVE IMPAIRMENTS: Abnormal gait, decreased activity tolerance, decreased balance, decreased mobility, difficulty walking, decreased strength, hypomobility, impaired flexibility, impaired sensation, impaired tone, improper body mechanics, postural dysfunction, and pain.   ACTIVITY LIMITATIONS: carrying, lifting, bending, standing, squatting, stairs, transfers, and locomotion level  PARTICIPATION LIMITATIONS: meal prep, cleaning, laundry, shopping, and community activity  PERSONAL FACTORS: Fitness, Past/current experiences, Social background, Transportation, and 3+ comorbidities: Hypertension, history of a  TIA, history of multiple strokes, COPD, hepatitis C, chronic low back pain, current smoker, depression, and anxiety  are also affecting patient's functional outcome.   REHAB POTENTIAL: Fair    CLINICAL DECISION MAKING: Unstable/unpredictable  EVALUATION COMPLEXITY: High  PLAN:  PT FREQUENCY: 1-2x/week  PT DURATION: other: 5 weeks  PLANNED INTERVENTIONS: Therapeutic exercises, Therapeutic activity, Neuromuscular re-education, Balance training, Gait training, Patient/Family  education, Self Care, Stair training, and Re-evaluation  PLAN FOR NEXT SESSION: NuStep, lower extremity strengthening, postural reeducation, and balance interventions   Darlin Coco, PT 02/26/2022, 8:12 AM

## 2022-02-26 NOTE — Patient Outreach (Signed)
Care Coordination  02/26/2022  Jenita Girven 1965/06/17 [id number]  Successful outreach with [name] today. However she was unable to complete this initial visit. She had other plans this afternoon to witness for a wedding. RNCM scheduled a telephone visit on 03/10/22 @ 2:30pm. Patient agrees to new date and time.  Lurena Joiner RN, BSN [place]  [employer] manager

## 2022-02-27 DIAGNOSIS — I63339 Cerebral infarction due to thrombosis of unspecified posterior cerebral artery: Secondary | ICD-10-CM | POA: Diagnosis not present

## 2022-02-28 ENCOUNTER — Emergency Department (HOSPITAL_COMMUNITY): Payer: Medicaid Other

## 2022-02-28 ENCOUNTER — Other Ambulatory Visit: Payer: Self-pay

## 2022-02-28 ENCOUNTER — Emergency Department (HOSPITAL_COMMUNITY)
Admission: EM | Admit: 2022-02-28 | Discharge: 2022-02-28 | Disposition: A | Discharge status: Home / Self Care | Payer: Medicaid Other | Attending: Emergency Medicine | Admitting: Emergency Medicine

## 2022-02-28 DIAGNOSIS — Z7982 Long term (current) use of aspirin: Secondary | ICD-10-CM | POA: Insufficient documentation

## 2022-02-28 DIAGNOSIS — R300 Dysuria: Secondary | ICD-10-CM | POA: Diagnosis present

## 2022-02-28 DIAGNOSIS — I63339 Cerebral infarction due to thrombosis of unspecified posterior cerebral artery: Secondary | ICD-10-CM | POA: Diagnosis not present

## 2022-02-28 DIAGNOSIS — Z8673 Personal history of transient ischemic attack (TIA), and cerebral infarction without residual deficits: Secondary | ICD-10-CM | POA: Insufficient documentation

## 2022-02-28 DIAGNOSIS — R42 Dizziness and giddiness: Secondary | ICD-10-CM | POA: Insufficient documentation

## 2022-02-28 DIAGNOSIS — N390 Urinary tract infection, site not specified: Secondary | ICD-10-CM | POA: Diagnosis not present

## 2022-02-28 LAB — COMPREHENSIVE METABOLIC PANEL
ALT: 13 U/L (ref 0–44)
AST: 20 U/L (ref 15–41)
Albumin: 4.2 g/dL (ref 3.5–5.0)
Alkaline Phosphatase: 90 U/L (ref 38–126)
Anion gap: 9 (ref 5–15)
BUN: 6 mg/dL (ref 6–20)
CO2: 28 mmol/L (ref 22–32)
Calcium: 9.4 mg/dL (ref 8.9–10.3)
Chloride: 101 mmol/L (ref 98–111)
Creatinine, Ser: 0.71 mg/dL (ref 0.44–1.00)
GFR, Estimated: >60 mL/min (ref >60)
Glucose, Bld: 106 mg/dL — ABNORMAL HIGH (ref 70–99)
Potassium: 4.4 mmol/L (ref 3.5–5.1)
Sodium: 138 mmol/L (ref 135–145)
Total Bilirubin: 0.6 mg/dL (ref 0.3–1.2)
Total Protein: 8.4 g/dL — ABNORMAL HIGH (ref 6.5–8.1)

## 2022-02-28 LAB — URINALYSIS, ROUTINE W REFLEX MICROSCOPIC
Bacteria, UA: NONE SEEN
Bilirubin Urine: NEGATIVE
Glucose, UA: NEGATIVE mg/dL
Ketones, ur: NEGATIVE mg/dL
Nitrite: NEGATIVE
Protein, ur: NEGATIVE mg/dL
Specific Gravity, Urine: 1.003 — ABNORMAL LOW (ref 1.005–1.030)
pH: 6 (ref 5.0–8.0)

## 2022-02-28 LAB — RAPID URINE DRUG SCREEN, HOSP PERFORMED
Amphetamines: NOT DETECTED
Barbiturates: NOT DETECTED
Benzodiazepines: NOT DETECTED
Cocaine: NOT DETECTED
Opiates: NOT DETECTED
Tetrahydrocannabinol: NOT DETECTED

## 2022-02-28 LAB — DIFFERENTIAL
Abs Immature Granulocytes: 0.02 10*3/uL (ref 0.00–0.07)
Basophils Absolute: 0 10*3/uL (ref 0.0–0.1)
Basophils Relative: 0 %
Eosinophils Absolute: 0.2 10*3/uL (ref 0.0–0.5)
Eosinophils Relative: 2 %
Immature Granulocytes: 0 %
Lymphocytes Relative: 18 %
Lymphs Abs: 1.3 10*3/uL (ref 0.7–4.0)
Monocytes Absolute: 0.3 10*3/uL (ref 0.1–1.0)
Monocytes Relative: 5 %
Neutro Abs: 5.2 10*3/uL (ref 1.7–7.7)
Neutrophils Relative %: 75 %

## 2022-02-28 LAB — CBC
HCT: 39.2 % (ref 36.0–46.0)
Hemoglobin: 13 g/dL (ref 12.0–15.0)
MCH: 30.4 pg (ref 26.0–34.0)
MCHC: 33.2 g/dL (ref 30.0–36.0)
MCV: 91.8 fL (ref 80.0–100.0)
Platelets: 241 10*3/uL (ref 150–400)
RBC: 4.27 MIL/uL (ref 3.87–5.11)
RDW: 12.9 % (ref 11.5–15.5)
WBC: 7 10*3/uL (ref 4.0–10.5)
nRBC: 0 % (ref 0.0–0.2)

## 2022-02-28 LAB — APTT: aPTT: 29 seconds (ref 24–36)

## 2022-02-28 LAB — PROTIME-INR
INR: 1 (ref 0.8–1.2)
Prothrombin Time: 13.5 seconds (ref 11.4–15.2)

## 2022-02-28 LAB — ETHANOL: Alcohol, Ethyl (B): <10 mg/dL (ref <10)

## 2022-02-28 MED ORDER — FOSFOMYCIN TROMETHAMINE 3 G PO PACK
3.0000 g | PACK | Freq: Once | ORAL | Status: AC
  Administered 2022-02-28: 3 g via ORAL
  Filled 2022-02-28: qty 3

## 2022-02-28 MED ORDER — MECLIZINE HCL 12.5 MG PO TABS
25.0000 mg | ORAL_TABLET | Freq: Once | ORAL | Status: DC
  Filled 2022-02-28: qty 2

## 2022-02-28 MED ORDER — SODIUM CHLORIDE 0.9 % IV SOLN
100.0000 mL/h | INTRAVENOUS | Status: DC
  Administered 2022-02-28: 100 mL/h via INTRAVENOUS

## 2022-02-28 MED ORDER — SODIUM CHLORIDE 0.9 % IV BOLUS
500.0000 mL | Freq: Once | INTRAVENOUS | Status: AC
  Administered 2022-02-28: 500 mL via INTRAVENOUS

## 2022-02-28 NOTE — ED Provider Notes (Signed)
[facility] Provider Note   CSN: [id number] Arrival date & time: 02/28/22  1032     History  No chief complaint on file.   Alice [name] is a 57 y.o. female.  HPI Patient presents with dizziness and dysuria. She has multiple medical problems including prior stroke.  She notes that over the past 2 days she has had some dysuria, typically at the end of urinating. Today, however, about 4 hours ago she noticed dizziness, described as unsteadiness.  This is improved markedly.  In the interim, however, she has had 1 episode of transient double vision.  That resolved spontaneously as well. No pain that is new though she notes that she has chronic pain issues.     Home Medications Prior to Admission medications   Medication Sig Start Date End Date Taking? Authorizing Provider  albuterol (PROVENTIL) (2.5 MG/3ML) 0.083% nebulizer solution Take 3 mLs (2.5 mg total) by nebulization every 6 (six) hours as needed for wheezing or shortness of breath. Patient not taking: Reported on 01/29/2022 09/22/21   Loman Brooklyn, FNP  aspirin EC 81 MG tablet Take 1 tablet (81 mg total) by mouth daily with breakfast. For Stroke Prevention 07/25/21   Roxan Hockey, MD  atorvastatin (LIPITOR) 40 MG tablet Take 1 tablet (40 mg total) by mouth daily. 01/22/22   de Yolanda Manges, Cortney E, NP  baclofen (LIORESAL) 10 MG tablet Take 1 tablet (10 mg total) by mouth 2 (two) times daily. 07/25/21   Roxan Hockey, MD  famotidine (PEPCID) 20 MG tablet One after supper Patient taking differently: Take 20 mg by mouth daily. One after supper 01/14/22   [name], MD  gabapentin (NEURONTIN) 100 MG capsule Take 1 capsule (100 mg total) by mouth 4 (four) times daily. 01/14/22   [name], MD  nicotine (NICODERM CQ - DOSED IN MG/24 HR) 7 mg/24hr patch Place 1 patch (7 mg total) onto the skin daily. 01/22/22   de Yolanda Manges, Cortney E, NP  pantoprazole (PROTONIX) 40 MG tablet Take  1 tablet (40 mg total) by mouth daily. 07/25/21   Emokpae, Courage, MD  SUBOXONE 8-2 MG FILM Place 0.5 Film under the tongue every 6 (six) hours. 10/15/20   [provider]  valsartan (DIOVAN) 160 MG tablet Take 1 tablet (160 mg total) by mouth daily. Patient not taking: Reported on 01/29/2022 01/14/22   [name], MD      Allergies    Benadryl [diphenhydramine hcl], Codeine, and Tetracycline    Review of Systems   Review of Systems  All other systems reviewed and are negative.   Physical Exam Updated Vital Signs BP 116/83   Pulse 62   Temp 97.9 F (36.6 C)   Resp 20   Ht 6' (1.829 m)   Wt 90.7 kg   LMP 07/30/2011   SpO2 99%   BMI 27.12 kg/m  Physical Exam Vitals and nursing note reviewed.  Constitutional:      General: She is not in acute distress.    Appearance: She is well-developed.  HENT:     Head: Normocephalic and atraumatic.  Eyes:     Conjunctiva/sclera: Conjunctivae normal.  Pulmonary:     Effort: Pulmonary effort is normal. No respiratory distress.     Breath sounds: No stridor.  Abdominal:     General: There is no distension.  Skin:    General: Skin is warm and dry.  Neurological:     General:  No focal deficit present.     Mental Status: She is alert and oriented to person, place, and time. Mental status is at baseline.     Cranial Nerves: No cranial nerve deficit.  Psychiatric:        Mood and Affect: Mood normal.     ED Results / Procedures / Treatments   Labs (all labs ordered are listed, but only abnormal results are displayed) Labs Reviewed  COMPREHENSIVE METABOLIC PANEL - Abnormal; Notable for the following components:      Result Value   Glucose, Bld 106 (*)    Total Protein 8.4 (*)    All other components within normal limits  URINALYSIS, ROUTINE W REFLEX MICROSCOPIC - Abnormal; Notable for the following components:   Specific Gravity, Urine 1.003 (*)    Hgb urine dipstick SMALL (*)    Leukocytes,Ua MODERATE (*)    All  other components within normal limits  PROTIME-INR  APTT  CBC  DIFFERENTIAL  ETHANOL  RAPID URINE DRUG SCREEN, HOSP PERFORMED    EKG EKG Interpretation  Date/Time:  Sunday February 28 2022 10:53:09 EST Ventricular Rate:  71 PR Interval:  141 QRS Duration: 96 QT Interval:  387 QTC Calculation: 421 R Axis:   47 Text Interpretation: Sinus rhythm Abnormal R-wave progression, early transition Otherwise within normal limits Confirmed by Carmin Muskrat [phone]) on 02/28/2022 11:05:12 AM  Radiology CT HEAD WO CONTRAST  Result Date: 02/28/2022 CLINICAL DATA:  Provided history: Neuro deficit, acute, stroke suspected. History of CVA. Dizziness. Double vision. EXAM: CT HEAD WITHOUT CONTRAST TECHNIQUE: Contiguous axial images were obtained from the base of the skull through the vertex without intravenous contrast. RADIATION DOSE REDUCTION: This exam was performed according to the departmental dose-optimization program which includes automated exposure control, adjustment of the mA and/or kV according to patient size and/or use of iterative reconstruction technique. COMPARISON:  MRI 01/18/2022.  Head CT 01/17/2022. FINDINGS: Brain: No age advanced or lobar predominant parenchymal atrophy. Redemonstrated small chronic cortically-based infarct within the left parietal lobe. There is no acute intracranial hemorrhage. No demarcated cortical infarct. No extra-axial fluid collection. No evidence of an intracranial mass. No midline shift. Vascular: No hyperdense vessel. Skull: No fracture or aggressive osseous lesion. Sinuses/Orbits: No mass or acute finding within the imaged orbits. No significant paranasal sinus disease. IMPRESSION: 1.  No evidence of an acute intracranial abnormality. 2. Redemonstrated small chronic cortically-based infarct within the left parietal lobe. Electronically Signed   By: Kellie Simmering D.O.   On: 02/28/2022 11:30    Procedures Procedures    Medications Ordered in ED Medications   sodium chloride 0.9 % bolus 500 mL (0 mLs Intravenous Stopped 02/28/22 1247)    Followed by  0.9 %  sodium chloride infusion (0 mL/hr Intravenous Stopped 02/28/22 1247)  meclizine (ANTIVERT) tablet 25 mg (25 mg Oral Patient Refused/Not Given 02/28/22 1140)  fosfomycin (MONUROL) packet 3 g (has no administration in time range)    ED Course/ Medical Decision Making/ A&P                             Medical Decision Making Patient with history of prior stroke presents with dizziness, dysuria.  Rule out infection, bacteremia, sepsis, less likely acute stroke, intracranial mass or lesion.  On cardiac monitor the patient has sinus rhythm, rate 76.  Pulse ox 100% room air normal  Amount and/or Complexity of Data Reviewed External Data Reviewed: notes. Labs: ordered. Decision-making details  documented in ED Course. Radiology: ordered and independent interpretation performed. Decision-making details documented in ED Course. ECG/medicine tests: ordered and independent interpretation performed. Decision-making details documented in ED Course.  Risk Prescription drug management. Decision regarding hospitalization.   12:50 PM Patient awakens easily, is moving her head freely, has no ongoing complaints.  Findings consistent with lower urinary tract infection.  Some suspicion for vertiginous changes being benign given reassuring head CT, absence of focal neurodeficits, improvement here.  Similarly, no evidence for bacteremia, sepsis, patient appropriate for discharge with ongoing meclizine after receiving antibiotics here.        Final Clinical Impression(s) / ED Diagnoses Final diagnoses:  Dizziness  Lower urinary tract infectious disease    Rx / DC Orders ED Discharge Orders     None         Carmin Muskrat, MD 02/28/22 1250

## 2022-02-28 NOTE — ED Notes (Signed)
Pt reports a rash on her lower abdomen and under her breasts. Rash is dark red/brown in color. Pt applied "desitin" cream to the areas at home.

## 2022-02-28 NOTE — Discharge Instructions (Signed)
As discussed, your evaluation today has been largely reassuring.  But, it is important that you monitor your condition carefully, and do not hesitate to return to the ED if you develop new, or concerning changes in your condition. ? ?Otherwise, please follow-up with your physician for appropriate ongoing care. ? ?

## 2022-02-28 NOTE — ED Triage Notes (Signed)
Pt brought in by [facility] for c/o dizziness that started this am at 7;  Pt c/o irritation with urination when the stream stops x 2 days  BP 113/75 HR 70

## 2022-03-01 DIAGNOSIS — I63339 Cerebral infarction due to thrombosis of unspecified posterior cerebral artery: Secondary | ICD-10-CM | POA: Diagnosis not present

## 2022-03-02 ENCOUNTER — Encounter: Payer: Self-pay | Admitting: Family Medicine

## 2022-03-02 ENCOUNTER — Telehealth (INDEPENDENT_AMBULATORY_CARE_PROVIDER_SITE_OTHER): Payer: Medicaid Other | Admitting: Family Medicine

## 2022-03-02 DIAGNOSIS — B372 Candidiasis of skin and nail: Secondary | ICD-10-CM | POA: Diagnosis not present

## 2022-03-02 DIAGNOSIS — I63339 Cerebral infarction due to thrombosis of unspecified posterior cerebral artery: Secondary | ICD-10-CM | POA: Diagnosis not present

## 2022-03-02 MED ORDER — NYSTATIN 100000 UNIT/GM EX CREA: 1.0000 | TOPICAL_CREAM | Freq: Two times a day (BID) | CUTANEOUS | 1 refills | Status: DC

## 2022-03-02 MED ORDER — FLUCONAZOLE 150 MG PO TABS: 150.0000 mg | ORAL_TABLET | Freq: Once | ORAL | 0 refills | Status: AC

## 2022-03-02 NOTE — Progress Notes (Signed)
[facility] Video visit  Subjective: CC: rash PCP: Baruch Gouty, FNP [phone] Alice [name] is a 57 y.o. female calls for telephone consult today. Patient provides verbal consent for consult held via video.  Due to COVID-19 pandemic this visit was conducted virtually. This visit type was conducted due to national recommendations for restrictions regarding the COVID-19 Pandemic (e.g. social distancing, sheltering in place) in an effort to limit this patient's exposure and mitigate transmission in our community. All issues noted in this document were discussed and addressed.  A physical exam was not performed with this format.   Location of patient: home Location of provider: [facility] Others present for call: none  1. Rash Under bilateral breasts, under pannus, under axilla.  Started out as beet red and now it started peeling. Using desitin and baby powder.   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 (Patient not taking: Reported on 01/29/2022), Disp: 150 mL, Rfl: 1   aspirin EC 81 MG tablet, Take 1 tablet (81 mg total) by mouth daily with breakfast. For Stroke Prevention, Disp: 30 tablet, Rfl: 11   atorvastatin (LIPITOR) 40 MG tablet, Take 1 tablet (40 mg total) by mouth daily., Disp: 30 tablet, Rfl: 1   baclofen (LIORESAL) 10 MG tablet, Take 1 tablet (10 mg total) by mouth 2 (two) times daily., Disp: 60 tablet,  Rfl: 3   famotidine (PEPCID) 20 MG tablet, One after supper (Patient taking differently: Take 20 mg by mouth daily. One after supper), Disp: 30 tablet, Rfl: 11   gabapentin (NEURONTIN) 100 MG capsule, Take 1 capsule (100 mg total) by mouth 4 (four) times daily., Disp: 120 capsule, Rfl: 2   nicotine (NICODERM CQ - DOSED IN MG/24 HR) 7 mg/24hr patch, Place 1 patch (7 mg total) onto the skin daily., Disp: 28 patch, Rfl: 0   pantoprazole (PROTONIX) 40 MG tablet, Take 1 tablet (40 mg total) by mouth daily., Disp: 30 tablet, Rfl: 3   SUBOXONE 8-2 MG FILM, Place 0.5 Film under the tongue every 6 (six) hours., Disp: , Rfl:    valsartan (DIOVAN) 160 MG tablet, Take 1 tablet (160 mg total) by mouth daily. (Patient not taking: Reported on 01/29/2022), Disp: 30 tablet, Rfl: 11  Skin: Macerated, peeling skin noted underneath bilateral breasts and under pannus.  Assessment/ Plan: 57 y.o. female   Candidal intertrigo - Plan: fluconazole (DIFLUCAN) 150 MG tablet, nystatin cream (MYCOSTATIN)  Oral Diflucan given recent antibiotic use.  Topical nystatin to affected areas.  Keep areas clean and dry.  Home care instructions reviewed nares for reevaluation discussed.  Follow-up as needed  Start time: 1:36pm End time: 1:45p  Total time spent on patient care (including telephone call/ virtual visit): 9 minutes  [place], [place] [phone]

## 2022-03-03 DIAGNOSIS — I63339 Cerebral infarction due to thrombosis of unspecified posterior cerebral artery: Secondary | ICD-10-CM | POA: Diagnosis not present

## 2022-03-04 DIAGNOSIS — I63339 Cerebral infarction due to thrombosis of unspecified posterior cerebral artery: Secondary | ICD-10-CM | POA: Diagnosis not present

## 2022-03-05 DIAGNOSIS — I63339 Cerebral infarction due to thrombosis of unspecified posterior cerebral artery: Secondary | ICD-10-CM | POA: Diagnosis not present

## 2022-03-06 DIAGNOSIS — I63339 Cerebral infarction due to thrombosis of unspecified posterior cerebral artery: Secondary | ICD-10-CM | POA: Diagnosis not present

## 2022-03-07 DIAGNOSIS — I63339 Cerebral infarction due to thrombosis of unspecified posterior cerebral artery: Secondary | ICD-10-CM | POA: Diagnosis not present

## 2022-03-08 DIAGNOSIS — I63339 Cerebral infarction due to thrombosis of unspecified posterior cerebral artery: Secondary | ICD-10-CM | POA: Diagnosis not present

## 2022-03-09 DIAGNOSIS — I63339 Cerebral infarction due to thrombosis of unspecified posterior cerebral artery: Secondary | ICD-10-CM | POA: Diagnosis not present

## 2022-03-10 ENCOUNTER — Other Ambulatory Visit: Payer: Medicaid Other | Admitting: *Deleted

## 2022-03-10 ENCOUNTER — Encounter: Payer: Self-pay | Admitting: *Deleted

## 2022-03-10 ENCOUNTER — Other Ambulatory Visit: Payer: Self-pay | Admitting: Family Medicine

## 2022-03-10 DIAGNOSIS — I63339 Cerebral infarction due to thrombosis of unspecified posterior cerebral artery: Secondary | ICD-10-CM | POA: Diagnosis not present

## 2022-03-10 DIAGNOSIS — I639 Cerebral infarction, unspecified: Secondary | ICD-10-CM

## 2022-03-10 DIAGNOSIS — Z7409 Other reduced mobility: Secondary | ICD-10-CM

## 2022-03-10 NOTE — Patient Instructions (Signed)
Visit Information  Ms. Willbanks was given information about Medicaid Managed Care team care coordination services as a part of their [place] Medicaid benefit. [name] verbally consented to engagement with the [facility] Managed Care team.   If you are experiencing a medical emergency, please call 911 or report to your local emergency department or urgent care.   If you have a non-emergency medical problem during routine business hours, please contact your provider's office and ask to speak with a nurse.   For questions related to your [facility], please call: [phone] or visit the homepage here: [email or web address]  If you would like to schedule transportation through your [facility], please call the following number at least 2 days in advance of your appointment: [phone]   Rides for urgent appointments can also be made after hours by calling Member Services.  Call the [place] at [phone], at any time, 24 hours a day, 7 days a week. If you are in danger or need immediate medical attention call 911.  If you would like help to quit smoking, call 1-800-QUIT-NOW 810-104-5648) OR Espaol: 1-855-Djelo-Ya QO:409462) o para ms informacin haga clic aqu or Text READY to 200-400 to register via text  Ms. Gwenlyn Found,   Please see education materials related to stroke provided by MyChart link.  Patient verbalizes understanding of instructions and care plan provided today and agrees to view in [place]. Active MyChart status and patient understanding of how to access instructions and care plan via MyChart confirmed with patient.     Telephone follow up appointment with Managed Medicaid care management team member scheduled for:04/09/22 @ 1:15pm  Lurena Joiner RN, BSN [place] RN Care  Coordinator   Following is a copy of your plan of care:  Care Plan : RN Care Manager Plan of Care  Updates made by Melissa Montane, RN since 03/10/2022 12:00 AM     Problem: Health Management needs related to Stroke History      Long-Range Goal: Development of Plan of Care to address Health Management needs related to Stroke History   Start Date: 03/10/2022  Expected End Date: 07/08/2022  Note:   Current Barriers:  Chronic Disease Management support and education needs related to Stroke History  RNCM Clinical Goal(s):  Patient will verbalize understanding of plan for management of Stroke History as evidenced by patient reports take all medications exactly as prescribed and will call provider for medication related questions as evidenced by patient reports and EMR documentation    attend all scheduled medical appointments: 03/12/22 with PCP and 03/18/22 with Neurology as evidenced by provider documentation        work with Education officer, museum to address Financial constraints related to utilities, food and housing resources related to the management of Stroke History as evidenced by review of EMR and patient or Education officer, museum report     through collaboration with Consulting civil engineer, provider, and care team.   Interventions: Inter-disciplinary care team collaboration (see longitudinal plan of care) Evaluation of current treatment plan related to  self management and patient's adherence to plan as established by provider   Stroke:  (Status:New goal.) Long Term Goal Reviewed Importance of taking all medications as prescribed Reviewed Importance of attending all scheduled provider appointments Advised to report any changes in symptoms or exercise tolerance Assessed social determinant of health barriers Reviewed referrals to outpatient therapy Reviewed the importance of exercise Advised patient  to schedule with PT for continued care Collaborated with PCP to request needed DME, shower chair Scheduled  with BSW for resources for utilities, food and housing on 03/15/22 Discussed follow up with [facility] Provided encouragement for patient on her sobriety   Patient Goals/Self-Care Activities: Take medications as prescribed   Attend all scheduled provider appointments Call provider office for new concerns or questions  Work with the social worker to address care coordination needs and will continue to work with the clinical team to address health care and disease management related needs

## 2022-03-10 NOTE — Patient Outreach (Signed)
Medicaid Managed Care   Nurse Care Manager Note  03/10/2022 Name:  Alice [name] MRN:  [id number] DOB:  03-14-1965  Alice [name] is an 57 y.o. year old female who is a primary patient of Rakes, Connye Burkitt, FNP.  The [facility] Managed Care Coordination team was consulted for assistance with:    Stroke History  Alice [name] was given information about Medicaid Managed Care Coordination team services today. Alice [name] Patient agreed to services and verbal consent obtained.  Engaged with patient by telephone for initial visit in response to provider referral for case management and/or care coordination services.   Assessments/Interventions:  Review of past medical history, allergies, medications, health status, including review of consultants reports, laboratory and other test data, was performed as part of comprehensive evaluation and provision of chronic care management services.  SDOH (Social Determinants of Health) assessments and interventions performed: SDOH Interventions    Flowsheet Row Patient Outreach Telephone from 03/10/2022 in [place] Most recent reading at 03/10/2022  2:42 PM Patient Outreach Telephone from 02/26/2022 in [place] Most recent reading at 02/26/2022  2:48 PM Telephone from 01/29/2022 in [place] Most recent reading at 01/29/2022  1:11 PM Office Visit from 01/29/2022 in [place] Most recent reading at 01/29/2022  9:31 AM Office Visit from 08/14/2021 in [place] Most recent reading at 08/14/2021 12:16 PM Office Visit from 10/22/2020 in [place] Most recent reading at 10/22/2020 10:33 AM  SDOH Interventions        Food Insecurity Interventions -- Other (Comment)  [Collaborate with BSW] Intervention Not Indicated -- -- --  Housing Interventions Other (Comment)  [BSW  referral for housing resources] -- -- -- -- --  Transportation Interventions -- Payor Benefit Intervention Not Indicated -- -- --  Utilities Interventions -- Intervention Not Indicated -- -- -- --  Depression Interventions/Treatment  -- -- -- Currently on Treatment Patient refuses Treatment Patient refuses Treatment       Care Plan  Allergies  Allergen Reactions   Benadryl [Diphenhydramine Hcl] Hives   Codeine Rash   Tetracycline Itching and Rash    Medications Reviewed Today     Reviewed by Melissa Montane, RN (Registered Nurse) on 03/10/22 at 1454  Med List Status: <None>   Medication Order Taking? Sig Documenting Provider Last Dose Status Informant  albuterol (PROVENTIL) (2.5 MG/3ML) 0.083% nebulizer solution [id number]  Take 3 mLs (2.5 mg total) by nebulization every 6 (six) hours as needed for wheezing or shortness of breath.  Patient not taking: Reported on 01/29/2022   Loman Brooklyn, FNP  Active Self  aspirin EC 81 MG tablet [id number] Yes Take 1 tablet (81 mg total) by mouth daily with breakfast. For Stroke Prevention Roxan Hockey, MD Taking Active Self  atorvastatin (LIPITOR) 40 MG tablet [id number]  Take 1 tablet (40 mg total) by mouth daily. de Yolanda Manges, Cortney E, NP  Active   baclofen (LIORESAL) 10 MG tablet [id number] Yes Take 1 tablet (10 mg total) by mouth 2 (two) times daily. Roxan Hockey, MD Taking Active Self  famotidine (PEPCID) 20 MG tablet [id number] Yes One after supper  Patient taking differently: Take 20 mg by mouth daily. One after supper   [name], MD Taking Active Self  gabapentin (NEURONTIN) 100 MG capsule [id number] Yes Take 1 capsule (100 mg total) by mouth 4 (four) times daily. [name],  MD Taking Active Self  nicotine (NICODERM CQ - DOSED IN MG/24 HR) 7 mg/24hr patch [id number]  Place 1 patch (7 mg total) onto the skin daily. de Yolanda Manges, Cortney E, NP  Active   nystatin cream (MYCOSTATIN) 0000000 No Apply 1 Application topically 2  (two) times daily. X7-10 days.  Patient not taking: Reported on 03/10/2022   Janora Norlander, DO Not Taking Active            Med Note (Taevyn Hausen A   Wed Mar 10, 2022  2:54 PM) completed  pantoprazole (PROTONIX) 40 MG tablet [id number] Yes Take 1 tablet (40 mg total) by mouth daily. Roxan Hockey, MD Taking Active Self  SUBOXONE 8-2 MG FILM [id number] Yes Place 0.5 Film under the tongue every 6 (six) hours. [provider] Taking Active Self  valsartan (DIOVAN) 160 MG tablet [id number]  Take 1 tablet (160 mg total) by mouth daily.  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 smoking 01/15/2022   Upper airway cough syndrome 01/14/2022   Vertigo 07/24/2021   Fall at home, initial encounter 07/24/2021   GERD (gastroesophageal reflux disease) 07/24/2021   TIA (transient ischemic attack) 05/31/2021   Full incontinence of feces 02/13/2021   Urinary incontinence 02/13/2021   Hospital discharge follow-up 12/12/2020   Depression, major, single episode, moderate (HCC) 10/22/2020   Chronic pain of right knee 11/01/2018   GAD (generalized anxiety disorder) 11/01/2018   Vitamin D deficiency 11/01/2018   Acute bacterial endocarditis 07/27/2018   Ataxia 09/08/2016   Dysarthria 09/08/2016   H/o Prior ischemic stroke ([place]) 09/03/2016   Chronic hepatitis C virus infection ([place]) 05/09/2016   History of stroke 05/09/2016   History of substance abuse ([place]) 05/09/2016   Stroke-like symptoms 05/09/2016   Sinusitis 05/09/2016   Heart murmur 01/08/2016   IVDU (intravenous drug user) 01/08/2016   COPD with exacerbation ([place]) 10/31/2015   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   AKI (acute kidney injury) ([place]) 10/04/2015   Dyslipidemia 10/04/2015   HTN (hypertension) 10/04/2015   Discitis 10/04/2015   Severe  recurrent major depression without psychotic features ([place]) 05/08/2015   Muscular deconditioning 05/24/2013   Postlaminectomy syndrome, lumbar region 12/04/2012   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   Adverse drug reaction 06/17/2012   Anemia 06/17/2012   Chronic low back pain 06/17/2012   DDD (degenerative disc disease), lumbosacral 06/17/2012   Depressive disorder 06/17/2012   Insomnia 06/17/2012   Pain in soft tissues of limb 06/17/2012   Myalgia and myositis 06/17/2012   Thoracic or lumbosacral neuritis or radiculitis 06/17/2012   Sacroiliitis ([place]) 06/17/2012   Opioid use disorder, severe, dependence ([place]) 07/30/2011    Class: Acute   Precordial pain 02/18/2010   Cigarette smoker 12/05/2009   Anxiety state 12/05/2009   Palpitations 12/05/2009    Conditions to be addressed/monitored per PCP order:   Stroke History  Care Plan : [place] of Care  Updates made by Melissa Montane, RN since 03/10/2022 12:00 AM     Problem: Health Management needs related to Stroke History      Long-Range Goal: Development of Plan of Care to address Health Management needs related to Stroke History   Start Date: 03/10/2022  Expected End  Date: 07/08/2022  Note:   Current Barriers:  Chronic Disease Management support and education needs related to Stroke History  RNCM Clinical Goal(s):  Patient will verbalize understanding of plan for management of Stroke History as evidenced by patient reports take all medications exactly as prescribed and will call provider for medication related questions as evidenced by patient reports and EMR documentation    attend all scheduled medical appointments: 03/12/22 with PCP and 03/18/22 with Neurology as evidenced by provider documentation        work with Education officer, museum to address Financial constraints related to utilities, food and housing resources related to the management of Stroke History as evidenced by review of EMR and patient or Education officer, museum  report     through collaboration with Consulting civil engineer, provider, and care team.   Interventions: Inter-disciplinary care team collaboration (see longitudinal plan of care) Evaluation of current treatment plan related to  self management and patient's adherence to plan as established by provider   Stroke:  (Status:New goal.) Long Term Goal Reviewed Importance of taking all medications as prescribed Reviewed Importance of attending all scheduled provider appointments Advised to report any changes in symptoms or exercise tolerance Assessed social determinant of health barriers Reviewed referrals to outpatient therapy Reviewed the importance of exercise Advised patient to schedule with PT for continued care Collaborated with PCP to request needed DME, shower chair Scheduled with BSW for resources for utilities, food and housing on 03/15/22 D[facility]ed follow up with [facility] Provided encouragement for patient on her sobriety   Patient Goals/Self-Care Activities: Take medications as prescribed   Attend all scheduled provider appointments Call provider office for new concerns or questions  Work with the social worker to address care coordination needs and will continue to work with the clinical team to address health care and disease management related needs       Follow Up:  Patient agrees to Care Plan and Follow-up.  Plan: The Managed Medicaid care management team will reach out to the patient again over the next 30 days.  Date/time of next scheduled RN care management/care coordination outreach:  04/09/22 @ 1:15pm  Lurena Joiner RN, BSN [place]  [employer] manager

## 2022-03-11 ENCOUNTER — Telehealth: Payer: Self-pay

## 2022-03-11 DIAGNOSIS — I63339 Cerebral infarction due to thrombosis of unspecified posterior cerebral artery: Secondary | ICD-10-CM | POA: Diagnosis not present

## 2022-03-11 DIAGNOSIS — J449 Chronic obstructive pulmonary disease, unspecified: Secondary | ICD-10-CM | POA: Diagnosis not present

## 2022-03-11 DIAGNOSIS — R32 Unspecified urinary incontinence: Secondary | ICD-10-CM | POA: Diagnosis not present

## 2022-03-11 DIAGNOSIS — I639 Cerebral infarction, unspecified: Secondary | ICD-10-CM | POA: Diagnosis not present

## 2022-03-11 NOTE — Telephone Encounter (Signed)
RX for shower chair was sent to [facility] ([facility] and inusrance want pay. Needs to be faxed to [place] [phone]. Please call patient when done.

## 2022-03-11 NOTE — Telephone Encounter (Signed)
Would like faxed to [place].  Patient aware has been faxed

## 2022-03-11 NOTE — Telephone Encounter (Signed)
-----   Message from Baruch Gouty, FNP sent at 03/10/2022  7:13 PM EST ----- Regarding: FW: DME We can see if pt has a preference   ----- Message ----- From: Melissa Montane, RN Sent: 03/10/2022   4:58 PM EST To: Baruch Gouty, FNP Subject: RE: DME                                        Thank you! I don't have a preference, send to DME supplier that your office typically uses. ----- Message ----- From: Baruch Gouty, FNP Sent: 03/10/2022   4:28 PM EST To: Melissa Montane, RN Subject: RE: DME                                        Order has been placed, where would you like this faxed to?   ----- Message ----- From: Melissa Montane, RN Sent: 03/10/2022   4:12 PM EST To: Baruch Gouty, FNP Subject: DME                                            Hi,  I am the HR Managed Medicaid Case Manager working with this patient. She is needing a shower chair due to limited mobility. Can you place an order for a shower chair? Thank you  Lurena Joiner RN, BSN [place] RN Care Coordinator

## 2022-03-11 NOTE — Telephone Encounter (Signed)
Order reprinted on PCPs desk

## 2022-03-12 ENCOUNTER — Ambulatory Visit: Payer: Medicaid Other | Admitting: Family Medicine

## 2022-03-12 ENCOUNTER — Encounter: Payer: Self-pay | Admitting: Family Medicine

## 2022-03-12 VITALS — BP 135/90 | HR 76 | Temp 96.8°F | Ht 73.0 in | Wt 199.0 lb

## 2022-03-12 DIAGNOSIS — I63339 Cerebral infarction due to thrombosis of unspecified posterior cerebral artery: Secondary | ICD-10-CM | POA: Diagnosis not present

## 2022-03-12 DIAGNOSIS — T402X5A Adverse effect of other opioids, initial encounter: Secondary | ICD-10-CM | POA: Diagnosis not present

## 2022-03-12 DIAGNOSIS — K219 Gastro-esophageal reflux disease without esophagitis: Secondary | ICD-10-CM | POA: Diagnosis not present

## 2022-03-12 DIAGNOSIS — R7989 Other specified abnormal findings of blood chemistry: Secondary | ICD-10-CM | POA: Diagnosis not present

## 2022-03-12 DIAGNOSIS — I10 Essential (primary) hypertension: Secondary | ICD-10-CM | POA: Diagnosis not present

## 2022-03-12 DIAGNOSIS — E785 Hyperlipidemia, unspecified: Secondary | ICD-10-CM

## 2022-03-12 DIAGNOSIS — K5903 Drug induced constipation: Secondary | ICD-10-CM

## 2022-03-12 MED ORDER — PANTOPRAZOLE SODIUM 40 MG PO TBEC: 40.0000 mg | DELAYED_RELEASE_TABLET | Freq: Every day | ORAL | 3 refills | Status: DC

## 2022-03-12 MED ORDER — POLYETHYLENE GLYCOL 3350 17 GM/SCOOP PO POWD: 17.0000 g | Freq: Every day | ORAL | 1 refills | Status: DC

## 2022-03-12 NOTE — Progress Notes (Signed)
Subjective:  Patient ID: Alice [name], female    DOB: 1965/09/29, 57 y.o.   MRN: [id number]  Patient Care Team: Baruch Gouty, FNP as PCP - General (Family Medicine) Ethelda Chick as Social Worker Melissa Montane, RN as Case Manager   Chief Complaint:  Establish Care ([place] patient )   HPI: Alice [name] is a 57 y.o. female presenting on 03/12/2022 for [place] ([place] patient )   1. Constipation due to opioid therapy Pt is on suboxone for opioids dependence and has been doing well. She does report constipation from this. States Ducolax is beneficial but she does not wish to take this on a daily basis.   2. Abnormal thyroid blood test TSH was elevated at 7.060 at last visit. She does report some fatigue and weight gain. No other reported symptoms.   3. Dyslipidemia On statin therapy and tolerates well. Denies myalgias. Does try to follow a healthy diet but does not exercise due to chronic pain.   4. Primary hypertension Was on medications in the past but these were discontinued due to hypotension. She denies headaches, chest pain, leg swelling, weakness, or confusion.   5. Gastroesophageal reflux disease without esophagitis Has been on PPI and as needed pepcid. She does report a cough at times. No hemoptysis, voice change, dysphagia, melena, or hematochezia. Has not been to GI for this in the past.      Relevant past medical, surgical, family, and social history reviewed and updated as indicated.  Allergies and medications reviewed and updated. Data reviewed: Chart in Epic.   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   Qualifier: Diagnosis of  By: Delfino Lovett     Chronic low back pain    Cocaine abuse (HCC)    COPD (chronic obstructive pulmonary disease) (HCC)    Heart murmur    HLD (hyperlipidemia)    Hypertension    Stroke ([place])    x4    Past Surgical History:  Procedure Laterality Date   ABDOMINAL HYSTERECTOMY     abd?   BACK SURGERY      hardware in back   cyst removed from ovary     FRACTURE SURGERY     LUMBAR SPINE   removal of BB in finger  2008   SALIVARY GLAND SURGERY     tumor reomoved from stomach      Social History   Socioeconomic History   Marital status: Widowed    Spouse name: Not on file   Number of children: Not on file   Years of education: Not on file   Highest education level: Not on file  Occupational History   Occupation: disabled  Tobacco Use   Smoking status: Every Day    Packs/day: 0.50    Years: 40.00    Total pack years: 20.00    Types: Cigarettes   Smokeless tobacco: Never  Vaping Use   Vaping Use: Never used  Substance and Sexual Activity   Alcohol use: Not Currently    Comment: today   Drug use: Not Currently    Types: Heroin, "Crack" cocaine, Oxycodone, Cocaine    Comment: last used 05/24/20   Sexual activity: Not Currently  Other Topics Concern   Not on file  Social History Narrative   Single, 2 children; disabled.    Social Determinants of Health   Financial Resource Strain: Low Risk  (07/28/2021)   Overall Financial Resource Strain (CARDIA)    Difficulty  of Paying Living Expenses: Not hard at all  Food Insecurity: Food Insecurity Present (02/26/2022)   Hunger Vital Sign    Worried About Running Out of Food in the Last Year: Sometimes true    Ran Out of Food in the Last Year: Sometimes true  Transportation Needs: No Transportation Needs (02/26/2022)   PRAPARE - Hydrologist (Medical): No    Lack of Transportation (Non-Medical): No  Physical Activity: Not on file  Stress: Not on file  Social Connections: Not on file  Intimate Partner Violence: Not on file    Outpatient Encounter Medications as of 03/12/2022  Medication Sig   aspirin EC 81 MG tablet Take 1 tablet (81 mg total) by mouth daily with breakfast. For Stroke Prevention   atorvastatin (LIPITOR) 40 MG tablet Take 1 tablet (40 mg total) by mouth daily.   baclofen (LIORESAL) 10  MG tablet Take 1 tablet (10 mg total) by mouth 2 (two) times daily.   famotidine (PEPCID) 20 MG tablet One after supper (Patient taking differently: Take 20 mg by mouth daily. One after supper)   gabapentin (NEURONTIN) 100 MG capsule Take 1 capsule (100 mg total) by mouth 4 (four) times daily.   nystatin cream (MYCOSTATIN) Apply 1 Application topically 2 (two) times daily. X7-10 days.   polyethylene glycol powder (GLYCOLAX/MIRALAX) 17 GM/SCOOP powder Take 17 g by mouth daily.   SUBOXONE 8-2 MG FILM Place 0.5 Film under the tongue every 6 (six) hours.   [DISCONTINUED] pantoprazole (PROTONIX) 40 MG tablet Take 1 tablet (40 mg total) by mouth daily.   pantoprazole (PROTONIX) 40 MG tablet Take 1 tablet (40 mg total) by mouth daily.   [DISCONTINUED] albuterol (PROVENTIL) (2.5 MG/3ML) 0.083% nebulizer solution Take 3 mLs (2.5 mg total) by nebulization every 6 (six) hours as needed for wheezing or shortness of breath. (Patient not taking: Reported on 01/29/2022)   [DISCONTINUED] nicotine (NICODERM CQ - DOSED IN MG/24 HR) 7 mg/24hr patch Place 1 patch (7 mg total) onto the skin daily.   [DISCONTINUED] valsartan (DIOVAN) 160 MG tablet Take 1 tablet (160 mg total) by mouth daily. (Patient not taking: Reported on 01/29/2022)   No facility-administered encounter medications on file as of 03/12/2022.    Allergies  Allergen Reactions   Benadryl [Diphenhydramine Hcl] Hives   Codeine Rash   Tetracycline Itching and Rash    Review of Systems  Constitutional:  Positive for fatigue and unexpected weight change. Negative for activity change, appetite change, chills, diaphoresis and fever.  HENT: Negative.  Negative for sore throat, trouble swallowing and voice change.   Eyes: Negative.  Negative for photophobia and visual disturbance.  Respiratory:  Positive for cough. Negative for apnea, choking, chest tightness, shortness of breath and stridor.   Cardiovascular:  Negative for chest pain, palpitations and leg  swelling.  Gastrointestinal:  Positive for constipation. Negative for abdominal distention, abdominal pain, anal bleeding, blood in stool, diarrhea, nausea, rectal pain and vomiting.  Endocrine: Negative.  Negative for polydipsia, polyphagia and polyuria.  Genitourinary:  Negative for decreased urine volume, difficulty urinating, dysuria, frequency and urgency.  Musculoskeletal:  Positive for arthralgias, back pain and gait problem. Negative for myalgias.  Skin: Negative.   Allergic/Immunologic: Negative.   Neurological:  Negative for dizziness, tremors, seizures, syncope, facial asymmetry, speech difficulty, weakness, light-headedness, numbness and headaches.  Hematological: Negative.   Psychiatric/Behavioral:  Negative for confusion, hallucinations, sleep disturbance and suicidal ideas.   All other systems reviewed and are negative.  Objective:  BP (!) 135/90   Pulse 76   Temp (!) 96.8 F (36 C) (Temporal)   Ht '6\' 1"'$  (1.854 m)   Wt 199 lb (90.3 kg)   LMP 07/30/2011   SpO2 97%   BMI 26.25 kg/m    Wt Readings from Last 3 Encounters:  03/12/22 199 lb (90.3 kg)  02/28/22 200 lb (90.7 kg)  01/29/22 199 lb (90.3 kg)    Physical Exam Vitals and nursing note reviewed.  Constitutional:      General: She is not in acute distress.    Appearance: Normal appearance. She is well-developed and well-groomed. She is not ill-appearing, toxic-appearing or diaphoretic.  HENT:     Head: Normocephalic and atraumatic.     Jaw: There is normal jaw occlusion.     Right Ear: Hearing normal.     Left Ear: Hearing normal.     Nose: Nose normal.     Mouth/Throat:     Lips: Pink.     Mouth: Mucous membranes are moist.     Pharynx: Uvula midline.  Eyes:     General: Lids are normal.     Conjunctiva/sclera: Conjunctivae normal.     Pupils: Pupils are equal, round, and reactive to light.  Neck:     Thyroid: No thyroid mass, thyromegaly or thyroid tenderness.     Vascular: No carotid bruit  or JVD.     Trachea: Trachea and phonation normal.  Cardiovascular:     Rate and Rhythm: Normal rate and regular rhythm.     Chest Wall: PMI is not displaced.     Pulses: Normal pulses.     Heart sounds: Murmur heard.     Systolic murmur is present with a grade of 2/6.     No friction rub. No gallop.  Pulmonary:     Effort: Pulmonary effort is normal. No respiratory distress.     Breath sounds: Normal breath sounds. No wheezing.  Abdominal:     General: Bowel sounds are normal. There is no abdominal bruit.     Palpations: Abdomen is soft. There is no hepatomegaly or splenomegaly.     Tenderness: There is no abdominal tenderness.  Musculoskeletal:     Cervical back: Neck supple.     Right lower leg: No edema.     Left lower leg: No edema.     Comments: Kyphosis   Lymphadenopathy:     Cervical: No cervical adenopathy.  Skin:    General: Skin is warm and dry.     Capillary Refill: Capillary refill takes less than 2 seconds.     Coloration: Skin is not cyanotic, jaundiced or pale.     Findings: No rash.  Neurological:     General: No focal deficit present.     Mental Status: She is alert and oriented to person, place, and time.     Sensory: Sensation is intact.     Motor: Motor function is intact.     Coordination: Coordination is intact.     Gait: Gait abnormal (using walker).     Deep Tendon Reflexes: Reflexes are normal and symmetric.  Psychiatric:        Attention and Perception: Attention and perception normal.        Mood and Affect: Mood and affect normal.        Speech: Speech normal.        Behavior: Behavior normal. Behavior is cooperative.        Thought Content: Thought content normal.  Cognition and Memory: Cognition and memory normal.        Judgment: Judgment normal.     Results for orders placed or performed during the hospital encounter of 02/28/22  Protime-INR  Result Value Ref Range   Prothrombin Time 13.5 11.4 - 15.2 seconds   INR 1.0 0.8 - 1.2   APTT  Result Value Ref Range   aPTT 29 24 - 36 seconds  CBC  Result Value Ref Range   WBC 7.0 4.0 - 10.5 K/uL   RBC 4.27 3.87 - 5.11 MIL/uL   Hemoglobin 13.0 12.0 - 15.0 g/dL   HCT 39.2 36.0 - 46.0 %   MCV 91.8 80.0 - 100.0 fL   MCH 30.4 26.0 - 34.0 pg   MCHC 33.2 30.0 - 36.0 g/dL   RDW 12.9 11.5 - 15.5 %   Platelets 241 150 - 400 K/uL   nRBC 0.0 0.0 - 0.2 %  Differential  Result Value Ref Range   Neutrophils Relative % 75 %   Neutro Abs 5.2 1.7 - 7.7 K/uL   Lymphocytes Relative 18 %   Lymphs Abs 1.3 0.7 - 4.0 K/uL   Monocytes Relative 5 %   Monocytes Absolute 0.3 0.1 - 1.0 K/uL   Eosinophils Relative 2 %   Eosinophils Absolute 0.2 0.0 - 0.5 K/uL   Basophils Relative 0 %   Basophils Absolute 0.0 0.0 - 0.1 K/uL   Immature Granulocytes 0 %   Abs Immature Granulocytes 0.02 0.00 - 0.07 K/uL  Comprehensive metabolic panel  Result Value Ref Range   Sodium 138 135 - 145 mmol/L   Potassium 4.4 3.5 - 5.1 mmol/L   Chloride 101 98 - 111 mmol/L   CO2 28 22 - 32 mmol/L   Glucose, Bld 106 (H) 70 - 99 mg/dL   BUN 6 6 - 20 mg/dL   Creatinine, Ser 0.71 0.44 - 1.00 mg/dL   Calcium 9.4 8.9 - 10.3 mg/dL   Total Protein 8.4 (H) 6.5 - 8.1 g/dL   Albumin 4.2 3.5 - 5.0 g/dL   AST 20 15 - 41 U/L   ALT 13 0 - 44 U/L   Alkaline Phosphatase 90 38 - 126 U/L   Total Bilirubin 0.6 0.3 - 1.2 mg/dL   GFR, Estimated >60 >60 mL/min   Anion gap 9 5 - 15  Ethanol  Result Value Ref Range   Alcohol, Ethyl (B) <10 <10 mg/dL  Urine rapid drug screen (hosp performed)not at [facility]  Result Value Ref Range   Opiates NONE DETECTED NONE DETECTED   Cocaine NONE DETECTED NONE DETECTED   Benzodiazepines NONE DETECTED NONE DETECTED   Amphetamines NONE DETECTED NONE DETECTED   Tetrahydrocannabinol NONE DETECTED NONE DETECTED   Barbiturates NONE DETECTED NONE DETECTED  Urinalysis, Routine w reflex microscopic -Urine, Clean Catch  Result Value Ref Range   Color, Urine YELLOW YELLOW   APPearance CLEAR CLEAR    Specific Gravity, Urine 1.003 (L) 1.005 - 1.030   pH 6.0 5.0 - 8.0   Glucose, UA NEGATIVE NEGATIVE mg/dL   Hgb urine dipstick SMALL (A) NEGATIVE   Bilirubin Urine NEGATIVE NEGATIVE   Ketones, ur NEGATIVE NEGATIVE mg/dL   Protein, ur NEGATIVE NEGATIVE mg/dL   Nitrite NEGATIVE NEGATIVE   Leukocytes,Ua MODERATE (A) NEGATIVE   RBC / HPF 0-5 0 - 5 RBC/hpf   WBC, UA 21-50 0 - 5 WBC/hpf   Bacteria, UA NONE SEEN NONE SEEN   Squamous Epithelial / HPF 0-5 0 - 5 /HPF  WBC Clumps PRESENT        Pertinent labs & imaging results that were available during my care of the patient were reviewed by me and considered in my medical decision making.  Assessment & Plan:  Devonda was seen today for establish care.  Diagnoses and all orders for this visit:  Primary hypertension Has been well controlled off of medications. Will continue to monitor and restart if BP remains above goal. DASH diet and exercise encouraged.  -     Thyroid Panel With TSH -     Lipid panel  Constipation due to opioid therapy Referral to GI placed. Will start Miralax daily. Increase water and fiber intake. -     Ambulatory referral to Gastroenterology -     polyethylene glycol powder (GLYCOLAX/MIRALAX) 17 GM/SCOOP powder; Take 17 g by mouth daily.  Abnormal thyroid blood test Will repeat labs today and start repletion therapy if warranted.  -     Thyroid Panel With TSH  Dyslipidemia Diet encouraged - increase intake of fresh fruits and vegetables, increase intake of lean proteins. Bake, broil, or grill foods. Avoid fried, greasy, and fatty foods. Avoid fast foods. Increase intake of fiber-rich whole grains. Exercise encouraged - at least 150 minutes per week and advance as tolerated.  Goal BMI < 25. Continue medications as prescribed. Follow up in 3-6 months as discussed.  -     Lipid panel  Gastroesophageal reflux disease without esophagitis No red flags present. Diet discussed. Avoid fried, spicy, fatty, greasy, and  acidic foods. Avoid caffeine, nicotine, and alcohol. Do not eat 2-3 hours before bedtime and stay upright for at least 1-2 hours after eating. Eat small frequent meals. Avoid NSAID's like motrin and aleve. Medications as prescribed. Report any new or worsening symptoms. Follow up as discussed or sooner if needed.   -     Ambulatory referral to Gastroenterology -     pantoprazole (PROTONIX) 40 MG tablet; Take 1 tablet (40 mg total) by mouth daily.     Continue all other maintenance medications.  Follow up plan: Return in about 3 months (around 06/12/2022), or if symptoms worsen or fail to improve, for prediabetes .   Continue healthy lifestyle choices, including diet (rich in fruits, vegetables, and lean proteins, and low in salt and simple carbohydrates) and exercise (at least 30 minutes of moderate physical activity daily).  Educational handout given for health maintenance   The above assessment and management plan was discussed with the patient. The patient verbalized understanding of and has agreed to the management plan. Patient is aware to call the clinic if they develop any new symptoms or if symptoms persist or worsen. Patient is aware when to return to the clinic for a follow-up visit. Patient educated on when it is appropriate to go to the emergency department.   Monia Pouch, FNP-C [facility] [phone]

## 2022-03-12 NOTE — Telephone Encounter (Signed)
Pt aware order for shower chair faxed to [facility]

## 2022-03-13 ENCOUNTER — Encounter: Payer: Self-pay | Admitting: Internal Medicine

## 2022-03-13 DIAGNOSIS — I63339 Cerebral infarction due to thrombosis of unspecified posterior cerebral artery: Secondary | ICD-10-CM | POA: Diagnosis not present

## 2022-03-13 LAB — THYROID PANEL WITH TSH
Free Thyroxine Index: 3 (ref 1.2–4.9)
T3 Uptake Ratio: 29 % (ref 24–39)
T4, Total: 10.5 ug/dL (ref 4.5–12.0)
TSH: 2.27 u[IU]/mL (ref 0.450–4.500)

## 2022-03-13 LAB — LIPID PANEL
Chol/HDL Ratio: 8.7 ratio — ABNORMAL HIGH (ref 0.0–4.4)
Cholesterol, Total: 131 mg/dL (ref 100–199)
HDL: 15 mg/dL — ABNORMAL LOW (ref >39)
LDL Chol Calc (NIH): 96 mg/dL (ref 0–99)
Triglycerides: 104 mg/dL (ref 0–149)
VLDL Cholesterol Cal: 20 mg/dL (ref 5–40)

## 2022-03-14 DIAGNOSIS — I63339 Cerebral infarction due to thrombosis of unspecified posterior cerebral artery: Secondary | ICD-10-CM | POA: Diagnosis not present

## 2022-03-15 ENCOUNTER — Other Ambulatory Visit: Payer: Medicaid Other

## 2022-03-15 DIAGNOSIS — I63339 Cerebral infarction due to thrombosis of unspecified posterior cerebral artery: Secondary | ICD-10-CM | POA: Diagnosis not present

## 2022-03-15 NOTE — Patient Instructions (Signed)
Visit Information  Alice [name] was given information about Medicaid Managed Care team care coordination services as a part of their [place] Medicaid benefit. Alice [name] verbally consented to engagement with the [facility] Managed Care team.   If you are experiencing a medical emergency, please call 911 or report to your local emergency department or urgent care.   If you have a non-emergency medical problem during routine business hours, please contact your provider's office and ask to speak with a nurse.   For questions related to your [facility], please call: [phone] or visit the homepage here: [email or web address]  If you would like to schedule transportation through your [facility], please call the following number at least 2 days in advance of your appointment: [phone]   Rides for urgent appointments can also be made after hours by calling Member Services.  Call the [facility] at [phone], at any time, 24 hours a day, 7 days a week. If you are in danger or need immediate medical attention call 911.  If you would like help to quit smoking, call 1-800-QUIT-NOW (903)046-1049) OR Espaol: 1-855-Djelo-Ya HD:1601594) o para ms informacin haga clic aqu or Text READY to 200-400 to register via text  Alice [name] - following are the goals we discussed in your visit today:   Goals Addressed   None      Social Worker will follow up in 30 days.   Alice [name], BSW, [place] Managed Medicaid Team  [phone]   Following is a copy of your plan of care:  There are no care plans that you recently modified to display for this patient.

## 2022-03-15 NOTE — Patient Outreach (Signed)
Medicaid Managed Care Social Work Note  03/15/2022 Name:  Alice [name] MRN:  [id number] DOB:  Nov 23, 1965  Alice [name] is an 57 y.o. year old female who is a primary patient of Rakes, Connye Burkitt, FNP.  The Medicaid Managed Care Coordination team was consulted for assistance with:  Community Resources   Alice [name] was given information about Medicaid Managed Care Coordination team services today. Alice [name] Patient agreed to services and verbal consent obtained.  Engaged with patient  for by telephone forfollow up visit in response to referral for case management and/or care coordination services.   Assessments/Interventions:  Review of past medical history, allergies, medications, health status, including review of consultants reports, laboratory and other test data, was performed as part of comprehensive evaluation and provision of chronic care management services.  SDOH: (Social Determinant of Health) assessments and interventions performed: SDOH Interventions    Flowsheet Row Office Visit from 03/12/2022 in [place] Most recent reading at 03/12/2022 11:09 AM Patient Outreach Telephone from 03/10/2022 in [place] Most recent reading at 03/10/2022  2:42 PM Patient Outreach Telephone from 02/26/2022 in [place] Most recent reading at 02/26/2022  2:48 PM Telephone from 01/29/2022 in [place] Most recent reading at 01/29/2022  1:11 PM Office Visit from 01/29/2022 in [place] Most recent reading at 01/29/2022  9:31 AM Office Visit from 08/14/2021 in [place] Most recent reading at 08/14/2021 12:16 PM  SDOH Interventions        Food Insecurity Interventions -- -- Other (Comment)  [Collaborate with BSW] Intervention Not Indicated -- --  Housing Interventions -- Other (Comment)  [BSW referral for  housing resources] -- -- -- --  Transportation Interventions -- -- Payor Benefit Intervention Not Indicated -- --  Utilities Interventions -- -- Intervention Not Indicated -- -- --  Depression Interventions/Treatment  Currently on Treatment -- -- -- Currently on Treatment Patient refuses Treatment     BSW completed a telephone outreach with patient. She stated she was has located resources for food and utilities, she only needs assistance with housing. She received a notice about 2 weeks ago that the housing she is in is going to be sold. BSW will mail patient resources for [place] counties. No other resources are needed at this time.  Advanced Directives Status:  Not addressed in this encounter.  Care Plan                 Allergies  Allergen Reactions   Benadryl [Diphenhydramine Hcl] Hives   Codeine Rash   Tetracycline Itching and Rash    Medications Reviewed Today     Reviewed by Baruch Gouty, FNP (Family Nurse Practitioner) on 03/12/22 at 1  Med List Status: <None>   Medication Order Taking? Sig Documenting Provider Last Dose Status Informant  aspirin EC 81 MG tablet [id number] Yes Take 1 tablet (81 mg total) by mouth daily with breakfast. For Stroke Prevention Roxan Hockey, MD Taking Active Self  atorvastatin (LIPITOR) 40 MG tablet [id number] Yes Take 1 tablet (40 mg total) by mouth daily. de Yolanda Manges, Cortney E, NP Taking Active   baclofen (LIORESAL) 10 MG tablet [id number] Yes Take 1 tablet (10 mg total) by mouth 2 (two) times daily. Roxan Hockey, MD Taking Active Self  famotidine (PEPCID) 20 MG tablet [id number] Yes One after supper  Patient taking differently: Take 20 mg by  mouth daily. One after supper   Alice Rockers, MD Taking Active Self  gabapentin (NEURONTIN) 100 MG capsule [id number] Yes Take 1 capsule (100 mg total) by mouth 4 (four) times daily. Alice Rockers, MD Taking Active Self  nystatin cream (MYCOSTATIN) [id number] Yes Apply 1  Application topically 2 (two) times daily. X7-10 days. Janora Norlander, DO Taking Active            Med Note ([name], Alice A   Wed Mar 10, 2022  2:54 PM) completed  pantoprazole (PROTONIX) 40 MG tablet [id number] Yes Take 1 tablet (40 mg total) by mouth daily. Roxan Hockey, MD Taking Active Self  polyethylene glycol powder (GLYCOLAX/MIRALAX) 17 GM/SCOOP powder [id number] Yes Take 17 g by mouth daily. Baruch Gouty, FNP  Active   SUBOXONE 8-2 MG FILM [id number] Yes Place 0.5 Film under the tongue every 6 (six) hours. [provider] Taking Active Self            Patient Active Problem List   Diagnosis Date Noted   Constipation due to opioid therapy 03/12/2022   COPD  GOLD ? 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   Follow up:  Patient agrees to Care Plan and Follow-up.  Plan: The Managed Medicaid care management team will reach out to the patient again over the next 30 days.   Mickel Fuchs, BSW, [place] Managed Medicaid Team  [phone]

## 2022-03-16 DIAGNOSIS — I63339 Cerebral infarction due to thrombosis of unspecified posterior cerebral artery: Secondary | ICD-10-CM | POA: Diagnosis not present

## 2022-03-17 ENCOUNTER — Encounter: Payer: Self-pay | Admitting: Neurology

## 2022-03-17 DIAGNOSIS — I63339 Cerebral infarction due to thrombosis of unspecified posterior cerebral artery: Secondary | ICD-10-CM | POA: Diagnosis not present

## 2022-03-18 ENCOUNTER — Ambulatory Visit: Payer: Medicaid Other | Admitting: Neurology

## 2022-03-18 ENCOUNTER — Encounter: Payer: Self-pay | Admitting: Neurology

## 2022-03-18 VITALS — BP 151/92 | HR 66 | Ht 72.0 in | Wt 199.0 lb

## 2022-03-18 DIAGNOSIS — E1342 Other specified diabetes mellitus with diabetic polyneuropathy: Secondary | ICD-10-CM

## 2022-03-18 DIAGNOSIS — R2990 Unspecified symptoms and signs involving the nervous system: Secondary | ICD-10-CM

## 2022-03-18 DIAGNOSIS — I63339 Cerebral infarction due to thrombosis of unspecified posterior cerebral artery: Secondary | ICD-10-CM | POA: Diagnosis not present

## 2022-03-18 DIAGNOSIS — Z8673 Personal history of transient ischemic attack (TIA), and cerebral infarction without residual deficits: Secondary | ICD-10-CM

## 2022-03-18 DIAGNOSIS — R269 Unspecified abnormalities of gait and mobility: Secondary | ICD-10-CM | POA: Diagnosis not present

## 2022-03-18 NOTE — Telephone Encounter (Signed)
Pt was seen in office today

## 2022-03-18 NOTE — Patient Instructions (Signed)
I had a long d/w patient about hers recent strokelike episode, prior  history of pontine, risk for recurrent stroke/TIAs, personally independently reviewed imaging studies and stroke evaluation results and answered questions.Continue aspirin 81 mg daily  for secondary stroke prevention and maintain strict control of hypertension with blood pressure goal below 130/90, diabetes with hemoglobin A1c goal below 6.5% and lipids with LDL cholesterol goal below 70 mg/dL. I also advised the patient to eat a healthy diet with plenty of whole grains, cereals, fruits and vegetables, exercise regularly and maintain ideal body weight .plan to check a urine analysis and culture as she is complaining of possible UTI.  He was advised to use a walker at all times due to her diabetic neuropathy and gait imbalance.  Followup in the future with me only as needed and no scheduled appointment was made.  Stroke Prevention Some medical conditions and behaviors can lead to a higher chance of having a stroke. You can help prevent a stroke by eating healthy, exercising, not smoking, and managing any medical conditions you have. Stroke is a leading cause of functional impairment. Primary prevention is particularly important because a majority of strokes are first-time events. Stroke changes the lives of not only those who experience a stroke but also their family and other caregivers. How can this condition affect me? A stroke is a medical emergency and should be treated right away. A stroke can lead to brain damage and can sometimes be life-threatening. If a person gets medical treatment right away, there is a better chance of surviving and recovering from a stroke. What can increase my risk? The following medical conditions may increase your risk of a stroke: Cardiovascular disease. High blood pressure (hypertension). Diabetes. High cholesterol. Sickle cell disease. Blood clotting disorders (hypercoagulable  state). Obesity. Sleep disorders (obstructive sleep apnea). Other risk factors include: Being older than age 35. Having a history of blood clots, stroke, or mini-stroke (transient ischemic attack, TIA). Genetic factors, such as race, ethnicity, or a family history of stroke. Smoking cigarettes or using other tobacco products. Taking birth control pills, especially if you also use tobacco. Heavy use of alcohol or drugs, especially cocaine and methamphetamine. Physical inactivity. What actions can I take to prevent this? Manage your health conditions High cholesterol levels. Eating a healthy diet is important for preventing high cholesterol. If cholesterol cannot be managed through diet alone, you may need to take medicines. Take any prescribed medicines to control your cholesterol as told by your health care provider. Hypertension. To reduce your risk of stroke, try to keep your blood pressure below 130/80. Eating a healthy diet and exercising regularly are important for controlling blood pressure. If these steps are not enough to manage your blood pressure, you may need to take medicines. Take any prescribed medicines to control hypertension as told by your health care provider. Ask your health care provider if you should monitor your blood pressure at home. Have your blood pressure checked every year, even if your blood pressure is normal. Blood pressure increases with age and some medical conditions. Diabetes. Eating a healthy diet and exercising regularly are important parts of managing your blood sugar (glucose). If your blood sugar cannot be managed through diet and exercise, you may need to take medicines. Take any prescribed medicines to control your diabetes as told by your health care provider. Get evaluated for obstructive sleep apnea. Talk to your health care provider about getting a sleep evaluation if you snore a lot or have excessive sleepiness.  Make sure that any other  medical conditions you have, such as atrial fibrillation or atherosclerosis, are managed. Nutrition Follow instructions from your health care provider about what to eat or drink to help manage your health condition. These instructions may include: Reducing your daily calorie intake. Limiting how much salt (sodium) you use to 1,500 milligrams (mg) each day. Using only healthy fats for cooking, such as olive oil, canola oil, or sunflower oil. Eating healthy foods. You can do this by: Choosing foods that are high in fiber, such as whole grains, and fresh fruits and vegetables. Eating at least 5 servings of fruits and vegetables a day. Try to fill one-half of your plate with fruits and vegetables at each meal. Choosing lean protein foods, such as lean cuts of meat, poultry without skin, fish, tofu, beans, and nuts. Eating low-fat dairy products. Avoiding foods that are high in sodium. This can help lower blood pressure. Avoiding foods that have saturated fat, trans fat, and cholesterol. This can help prevent high cholesterol. Avoiding processed and prepared foods. Counting your daily carbohydrate intake.  Lifestyle If you drink alcohol: Limit how much you have to: 0-1 drink a day for women who are not pregnant. 0-2 drinks a day for men. Know how much alcohol is in your drink. In the [place] one drink equals one 12 oz bottle of beer (344m), one 5 oz glass of wine (1468m, or one 1 oz glass of hard liquor (4448m Do not use any products that contain nicotine or tobacco. These products include cigarettes, chewing tobacco, and vaping devices, such as e-cigarettes. If you need help quitting, ask your health care provider. Avoid secondhand smoke. Do not use drugs. Activity  Try to stay at a healthy weight. Get at least 30 minutes of exercise on most days, such as: Fast walking. Biking. Swimming. Medicines Take over-the-counter and prescription medicines only as told by your health care  provider. Aspirin or blood thinners (antiplatelets or anticoagulants) may be recommended to reduce your risk of forming blood clots that can lead to stroke. Avoid taking birth control pills. Talk to your health care provider about the risks of taking birth control pills if: You are over 35 45ars old. You smoke. You get very bad headaches. You have had a blood clot. Where to find more information American Stroke Association: www.strokeassociation.org Get help right away if: You or a loved one has any symptoms of a stroke. "BE FAST" is an easy way to remember the main warning signs of a stroke: B - Balance. Signs are dizziness, sudden trouble walking, or loss of balance. E - Eyes. Signs are trouble seeing or a sudden change in vision. F - Face. Signs are sudden weakness or numbness of the face, or the face or eyelid drooping on one side. A - Arms. Signs are weakness or numbness in an arm. This happens suddenly and usually on one side of the body. S - Speech. Signs are sudden trouble speaking, slurred speech, or trouble understanding what people say. T - Time. Time to call emergency services. Write down what time symptoms started. You or a loved one has other signs of a stroke, such as: A sudden, severe headache with no known cause. Nausea or vomiting. Seizure. These symptoms may represent a serious problem that is an emergency. Do not wait to see if the symptoms will go away. Get medical help right away. Call your local emergency services (911 in the [place] Do not drive yourself to the hospital. Summary You  can help to prevent a stroke by eating healthy, exercising, not smoking, limiting alcohol intake, and managing any medical conditions you may have. Do not use any products that contain nicotine or tobacco. These include cigarettes, chewing tobacco, and vaping devices, such as e-cigarettes. If you need help quitting, ask your health care provider. Remember "BE FAST" for warning signs of a  stroke. Get help right away if you or a loved one has any of these signs. This information is not intended to replace advice given to you by your health care provider. Make sure you discuss any questions you have with your health care provider. Document Revised: 07/12/2019 Document Reviewed: 07/30/2019 Elsevier Patient Education  [place].

## 2022-03-18 NOTE — Progress Notes (Signed)
[facility] [address] [place]. [place] [address] [phone]       OFFICE CONSULT NOTE  [name] Date of Birth:  February 19, 1965 Medical Record Number:  [id number]   Referring MD:  Timmothy Euler  Reason for Referral:  Stroke  HPI: Alice [name] is a 57 year old pleasant Caucasian lady seen today for initial office consultation visit for stroke.  History is obtained from the patient and review of electronic medical records.  I personally reviewed pertinent available imaging films in PACS.  She has past medical history of hypertension, hyperlipidemia, remote drug abuse, COPD, low back pain and anemia.  She presented on 01/17/2022 with sudden onset of right arm and leg weakness, sensory loss and dysarthria.  She had trouble walking due to right leg weakness.  She was given IV TNK after discussion about risk-benefit and stroke scale of 5.  CT head on admission was unremarkable with aspect score of 10.  CT angiogram of the head and neck showed no large vessel stenosis or occlusion.  2D echo showed normal ejection fraction without cardiac source embolism.  LDL cholesterol was elevated at 129 mg percent.  Hemoglobin A1c was 5.9.  Zindaclin was negative.  MRI scan did not show any acute infarct.  Patient had prior history of right pontine infarct and 2017 and it was felt she had transient worsening of her deficits likely in the setting of a UTI.  Patient states she has done well since then.  She has had no recurrent stroke or TIA symptoms.  He remains on aspirin tolerating well without bleeding and only minor bruising.  Blood pressure is usually better controlled but today it is 151/92 in the office.  He is tolerating Lipitor 40 mg well without any side effects.  She had follow-up lipid profile on 03/12/22 with improvement in LDL to 96 mg %.  Has longstanding diabetic neuropathy with tingling numbness in the feet in a stocking distribution.  This is unchanged.  She feels she may be having  a UTI and wants me to check her urine for the same. History of stroke 09/2015 admitted for left arm weakness and numbness.  CT no acute abnormality.  UDS positive for cocaine and opiates.  MRI showed right pontine infarct.  CTA head and neck unremarkable.  EF 65 to 70%.  LDL of 115, A1c 5.5.  Discharged on aspirin 81 and Lipitor 40 08/2016 admitted for slurred speech and left-sided numbness, difficulty walking.  CT negative.  Status post tPA.  CTA head and neck unremarkable.  MRI no acute infarct.  LDL 58, A1c 5.4.  UDS positive for cocaine again.  Discharged on aspirin 325 and Lipitor 20 10/2020 admitted again for right-sided weakness and slurred speech.  MRI no acute infarct but two old left parietal infarct.  CT head and neck negative.  EF 60 to 69%.  Dr. Merlene Laughter saw patient, concerning for MRI negative stroke versus Todd's paralysis.  Discharged on DAPT and Lipitor.  Also put on Keppra.  05/2021 admitted for worsening dysarthria and right hand clumsiness.  CT, CTA head and neck, MRI no acute finding.  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  LDL 112/5.6.  UDS negative.  Discharged on DAPT and Lipitor 80.  Patient stated that she had not been taking Keppra at home.  Seizure less likely, did not restart Keppra 07/2021 admitted for fall and dizziness with left lower extremity numbness.  MRI negative. ROS:   14 system review of systems is  positive for weakness, numbness, slurred speech, tingling, gait imbalance and all other systems negative  PMH:  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   Qualifier: Diagnosis of  By: Delfino Lovett     Chronic low back pain    Cocaine abuse (HCC)    COPD (chronic obstructive pulmonary disease) (HCC)    Heart murmur    HLD (hyperlipidemia)    Hypertension    Stroke ([place])    x4    Social History:  Social History   Socioeconomic History   Marital status: Widowed    Spouse name: Not on file   Number of children: Not on  file   Years of education: Not on file   Highest education level: Not on file  Occupational History   Occupation: disabled  Tobacco Use   Smoking status: Every Day    Packs/day: 0.50    Years: 40.00    Total pack years: 20.00    Types: Cigarettes   Smokeless tobacco: Never  Vaping Use   Vaping Use: Never used  Substance and Sexual Activity   Alcohol use: Not Currently    Comment: today   Drug use: Not Currently    Types: Heroin, "Crack" cocaine, Oxycodone, Cocaine    Comment: last used 05/24/20   Sexual activity: Not Currently  Other Topics Concern   Not on file  Social History Narrative   Single, 2 children; disabled.    Social Determinants of Health   Financial Resource Strain: Low Risk  (07/28/2021)   Overall Financial Resource Strain (CARDIA)    Difficulty of Paying Living Expenses: Not hard at all  Food Insecurity: Food Insecurity Present (02/26/2022)   Hunger Vital Sign    Worried About Running Out of Food in the Last Year: Sometimes true    Ran Out of Food in the Last Year: Sometimes true  Transportation Needs: No Transportation Needs (02/26/2022)   PRAPARE - Hydrologist (Medical): No    Lack of Transportation (Non-Medical): No  Physical Activity: Not on file  Stress: Not on file  Social Connections: Not on file  Intimate Partner Violence: Not on file    Medications:   Current Outpatient Medications on File Prior to Visit  Medication Sig Dispense Refill   aspirin EC 81 MG tablet Take 1 tablet (81 mg total) by mouth daily with breakfast. For Stroke Prevention 30 tablet 11   atorvastatin (LIPITOR) 40 MG tablet Take 1 tablet (40 mg total) by mouth daily. 30 tablet 1   baclofen (LIORESAL) 10 MG tablet Take 1 tablet (10 mg total) by mouth 2 (two) times daily. 60 tablet 3   famotidine (PEPCID) 20 MG tablet One after supper (Patient taking differently: Take 20 mg by mouth daily. One after supper) 30 tablet 11   gabapentin (NEURONTIN) 100 MG  capsule Take 1 capsule (100 mg total) by mouth 4 (four) times daily. 120 capsule 2   pantoprazole (PROTONIX) 40 MG tablet Take 1 tablet (40 mg total) by mouth daily. 30 tablet 3   polyethylene glycol powder (GLYCOLAX/MIRALAX) 17 GM/SCOOP powder Take 17 g by mouth daily. 3350 g 1   SUBOXONE 8-2 MG FILM Place 0.5 Film under the tongue every 6 (six) hours.     No current facility-administered medications on file prior to visit.    Allergies:   Allergies  Allergen Reactions   Benadryl [Diphenhydramine Hcl] Hives   Codeine Rash   Tetracycline Itching and Rash  Physical Exam General: well developed, well nourished pleasant middle-aged Caucasian lady, seated, in no evident distress Head: head normocephalic and atraumatic.   Neck: supple with no carotid or supraclavicular bruits Cardiovascular: regular rate and rhythm, no murmurs Musculoskeletal: no deformity Skin:  no rash/petichiae Vascular:  Normal pulses all extremities  Neurologic Exam Mental Status: Awake and fully alert. Oriented to place and time. Recent and remote memory intact. Attention span, concentration and fund of knowledge appropriate. Mood and affect appropriate.  Cranial Nerves: Fundoscopic exam reveals sharp disc margins. Pupils equal, briskly reactive to light. Extraocular movements full without nystagmus. Visual fields full to confrontation. Hearing intact. Facial sensation intact. Face, tongue, palate moves normally and symmetrically.  Motor: Normal bulk and tone. Normal strength in all tested extremity muscles. Sensory.: i subjective diminished touch , pinprick , position and vibratory sensation in both legs from mid calf down Coordination: Rapid alternating movements normal in all extremities. Finger-to-nose and heel-to-shin performed accurately bilaterally. Gait and Station: Arises from chair without difficulty. Stance is normal. Gait demonstrates normal stride length and balance .  Not able to heel, toe and tandem  walk without difficulty.  Reflexes: 1+ and symmetric. Toes downgoing.   NIHSS  0 Modified Rankin  1   ASSESSMENT: 57 year old Caucasian lady with transient worsening of right hemiparesis and numbness in January 2024 in the setting of UTI possibly due to recrudescence of previous stroke symptoms treated with IV TNK with good recovery.  Vascular risk factors of hypertension, hyperlipidemia     PLAN:I1 had a long d/w patient about hers recent strokelike episode, prior  history of pontine, risk for recurrent stroke/TIAs, personally independently reviewed imaging studies and stroke evaluation results and answered questions.Continue aspirin 81 mg daily  for secondary stroke prevention and maintain strict control of hypertension with blood pressure goal below 130/90, diabetes with hemoglobin A1c goal below 6.5% and lipids with LDL cholesterol goal below 70 mg/dL. I also advised the patient to eat a healthy diet with plenty of whole grains, cereals, fruits and vegetables, exercise regularly and maintain ideal body weight .plan to check a urine analysis and culture as she is complaining of possible UTI.  He was advised to use a walker at all times due to her diabetic neuropathy and gait imbalance.  Followup in the future with me only as needed and no scheduled appointment was made.  Greater than 50% time during this 45-minute consultation visit was spent on counseling and coordination of care about close strokelike episode, prior stroke and discussion about prevention and treatment.  Antony Contras, MD Note: This document was prepared with digital dictation and possible smart phrase technology. Any transcriptional errors that result from this process are unintentional.

## 2022-03-19 ENCOUNTER — Encounter: Payer: Self-pay | Admitting: Physical Therapy

## 2022-03-19 ENCOUNTER — Ambulatory Visit: Payer: Medicaid Other | Attending: Neurology | Admitting: Physical Therapy

## 2022-03-19 DIAGNOSIS — I63339 Cerebral infarction due to thrombosis of unspecified posterior cerebral artery: Secondary | ICD-10-CM | POA: Diagnosis not present

## 2022-03-19 DIAGNOSIS — R262 Difficulty in walking, not elsewhere classified: Secondary | ICD-10-CM

## 2022-03-19 DIAGNOSIS — M6281 Muscle weakness (generalized): Secondary | ICD-10-CM

## 2022-03-19 LAB — MICROSCOPIC EXAMINATION
Bacteria, UA: NONE SEEN
Casts: NONE SEEN /lpf
WBC, UA: >30 /hpf — AB (ref 0–5)

## 2022-03-19 LAB — URINALYSIS, ROUTINE W REFLEX MICROSCOPIC
Bilirubin, UA: NEGATIVE
Glucose, UA: NEGATIVE
Ketones, UA: NEGATIVE
Nitrite, UA: NEGATIVE
Protein,UA: NEGATIVE
RBC, UA: NEGATIVE
Specific Gravity, UA: 1.01 (ref 1.005–1.030)
Urobilinogen, Ur: 0.2 mg/dL (ref 0.2–1.0)
pH, UA: 8 — ABNORMAL HIGH (ref 5.0–7.5)

## 2022-03-19 NOTE — Therapy (Addendum)
OUTPATIENT PHYSICAL THERAPY NEURO TREATMENT   Patient Name: Alice [name] MRN: [id number] DOB:March 25, 1965, 57 y.o., female Today's Date: 03/19/2022   PCP: Baruch Gouty, FNP REFERRING PROVIDER: Rosalin Hawking, MD   END OF SESSION:  PT End of Session - 03/19/22 0949     Visit Number 5    Number of Visits 10    Date for PT Re-Evaluation 04/09/22    PT Start Time 0947    PT Stop Time 1028    PT Time Calculation (min) 41 min    Equipment Utilized During Treatment Other (comment)   Rollator   Activity Tolerance Patient limited by fatigue    Behavior During Therapy [facility] for tasks assessed/performed            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   Qualifier: Diagnosis of  By: Delfino Lovett     Chronic low back pain    Cocaine abuse (HCC)    COPD (chronic obstructive pulmonary disease) ([place])    Heart murmur    HLD (hyperlipidemia)    Hypertension    Stroke ([place])    x4   Past Surgical History:  Procedure Laterality Date   ABDOMINAL HYSTERECTOMY     abd?   BACK SURGERY     hardware in back   cyst removed from ovary     FRACTURE SURGERY     LUMBAR SPINE   removal of BB in finger  2008   SALIVARY GLAND SURGERY     tumor reomoved from stomach     Patient Active Problem List   Diagnosis Date Noted   Constipation due to opioid therapy 03/12/2022   COPD  GOLD ? /  Group B still smoking 01/15/2022   GERD (gastroesophageal reflux disease) 07/24/2021   Depression, major, single episode, moderate ([place]) 10/22/2020   GAD (generalized anxiety disorder) 11/01/2018   Vitamin D deficiency 11/01/2018   H/o Prior ischemic stroke ([place]) 09/03/2016   History of stroke 05/09/2016   History of substance abuse ([place]) 05/09/2016   Heart murmur 01/08/2016   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   Dyslipidemia 10/04/2015   HTN (hypertension) 10/04/2015   Discitis 10/04/2015   Severe recurrent major depression without psychotic features ([place]) 05/08/2015    Postlaminectomy syndrome, lumbar region 12/04/2012   Anemia 06/17/2012   Chronic low back pain 06/17/2012   DDD (degenerative disc disease), lumbosacral 06/17/2012   Sacroiliitis ([place]) 06/17/2012   Cigarette smoker 12/05/2009    ONSET DATE: 01/17/22  REFERRING DIAG: Stroke-like symptoms   THERAPY DIAG:  Difficulty in walking, not elsewhere classified  Muscle weakness (generalized)  Rationale for Evaluation and Treatment: Rehabilitation  SUBJECTIVE:  SUBJECTIVE STATEMENT: Already somewhat fatigued on arrival. Doesn't feel like she's sleeping well and going to the bathroom a lot.  Pt accompanied by: self and husband  PERTINENT HISTORY: Hypertension, history of a TIA, history of multiple strokes, COPD, hepatitis C, chronic low back pain, current smoker, depression, and anxiety  PAIN:  Are you having pain? Yes: NPRS scale: 8/10 Pain location: low back Pain description: stabbing Aggravating factors: bending over, cleaning, and daily activities Relieving factors: none  PRECAUTIONS: None  PATIENT GOALS: improved function with her daily activities  OBJECTIVE:   DIAGNOSTIC FINDINGS: 01/18/22 brain MRI IMPRESSION: 1. No acute intracranial abnormality.   2. Essentially stable noncontrast MRI appearance of the brain since July, although vertex Superficial Siderosis and other scattered chronic cerebral blood products are more apparent on SWI this time vs GRE before. Appearance is nonspecific but does not rise to the level of amyloid angiopathy. Small chronic left parietal lobe infarct again noted.   SENSATION: Patient reports numbness from her mid tibia into her right foot. Diminished sensation throughout RLE with no dermatomal pattern.    POSTURE: rounded shoulders, forward head, decreased  lumbar lordosis, increased thoracic kyphosis, and flexed trunk   LOWER EXTREMITY MMT:    MMT Right Eval Left Eval  Hip flexion 3+/5 4-/5  Hip extension    Hip abduction    Hip adduction    Hip internal rotation    Hip external rotation    Knee flexion 4-/5 5/5  Knee extension 3+/5 4/5  Ankle dorsiflexion 3+/5 4-/5  Ankle plantarflexion    Ankle inversion    Ankle eversion    (Blank rows = not tested)  TRANSFERS: Assistive device utilized: Environmental consultant - 4 wheeled  Sit to stand: SBA and required upper extremity support from armrests Stand to sit: SBA and required upper extremity support from armrests for a slow and controlled movement  GAIT: Gait pattern: step through pattern, decreased step length- Right, decreased step length- Left, decreased stride length, decreased hip/knee flexion- Right, decreased hip/knee flexion- Left, Right foot flat, Left foot flat, knee flexed in stance- Right, knee flexed in stance- Left, trunk flexed, wide BOS, poor foot clearance- Right, and poor foot clearance- Left Assistive device utilized: Walker - 4 wheeled Level of assistance: Modified independence  FUNCTIONAL TESTS:  5 times sit to stand: 17 seconds; required upper extremity use for stand to sit and sit to stand transfers 03/19/22 Timed up and go (TUG): 16 seconds with rolling walker 03/19/22  TODAY'S TREATMENT:                                                                                                                              DATE:                      03/19/22     EXERCISE LOG  Exercise Repetitions and Resistance Comments  Nustep L3 x12 min   LAQ 3# x20 reps  Seated marching 3# x20 reps   Clam Red x20 reps   Ball squeeze  X2 min   Heel/toe raises X20 reps   Seated slouch/ overcorrect X2 min   Seated shoulder retraction Red theraband x30 reps   Sit to stands X5 reps in 17sec    Blank cell = exercise not performed today                                    2/9 EXERCISE LOG  Exercise  Repetitions and Resistance Comments  Nustep  L3 x 10 minutes   Rocker board (seated)  4 minutes    LAQ 25 reps each    Seated marching  25 reps each    Seated clams  25 reps    Seated hip ADD isometric  25 reps w/ 5 second hold   Slouch/ overcorrect 30 reps  For postural reeducation   Scapular retraction  20 reps  For postural reeducation   Reaching overhead  15 reps  For postural reeducation    Blank cell = exercise not performed today   PATIENT EDUCATION: Education details: Plan of care, prognosis, and goals for therapy Person educated: Patient Education method: Explanation Education comprehension: verbalized understanding  HOME EXERCISE PROGRAM:  GOALS: Goals reviewed with patient? Yes  SHORT TERM GOALS: Target date: 02/12/22  Patient will be independent with her initial HEP. Baseline: Goal status: On-going  2.  Patient will improve her 5 times sit to stand time to 23 seconds or less. Baseline:  Goal status: MET  3.  Patient will improve her timed up and go to 17 seconds or less. Baseline:  Goal status: MET  LONG TERM GOALS: Target date: 03/05/22  Patient will be independent with her advanced HEP. Baseline:  Goal status: On-going  2.  Patient will improve her 5 times sit to stand time to 15 seconds or less for improved lower extremity power. Baseline:  Goal status: On-going  3.  Patient will improve her timed up and go time to 13 seconds or less for improved safety. Baseline:  Goal status: On-going  ASSESSMENT:  CLINICAL IMPRESSION:  Patient presented in clinic with fatigue prior to treatment but able to tolerate therex fairly well as fatigue noted throughout treatment. Patient states that she mostly sits on the edge of the bed most of the day with pillows behind her back. Patient making progress with functional tests and goals today with time reduction with both tests.   OBJECTIVE IMPAIRMENTS: Abnormal gait, decreased activity tolerance, decreased balance,  decreased mobility, difficulty walking, decreased strength, hypomobility, impaired flexibility, impaired sensation, impaired tone, improper body mechanics, postural dysfunction, and pain.   ACTIVITY LIMITATIONS: carrying, lifting, bending, standing, squatting, stairs, transfers, and locomotion level  PARTICIPATION LIMITATIONS: meal prep, cleaning, laundry, shopping, and community activity  PERSONAL FACTORS: Fitness, Past/current experiences, Social background, Transportation, and 3+ comorbidities: Hypertension, history of a TIA, history of multiple strokes, COPD, hepatitis C, chronic low back pain, current smoker, depression, and anxiety  are also affecting patient's functional outcome.   REHAB POTENTIAL: Fair    CLINICAL DECISION MAKING: Unstable/unpredictable  EVALUATION COMPLEXITY: High  PLAN:  PT FREQUENCY: 1-2x/week  PT DURATION: other: 5 weeks  PLANNED INTERVENTIONS: Therapeutic exercises, Therapeutic activity, Neuromuscular re-education, Balance training, Gait training, Patient/Family education, Self Care, Stair training, and Re-evaluation  PLAN FOR NEXT SESSION: NuStep, lower extremity strengthening, postural reeducation, and balance interventions  Standley Brooking, PTA  03/19/2022, 10:32 AM

## 2022-03-20 DIAGNOSIS — I63339 Cerebral infarction due to thrombosis of unspecified posterior cerebral artery: Secondary | ICD-10-CM | POA: Diagnosis not present

## 2022-03-20 NOTE — Progress Notes (Signed)
Kindly inform the patient that urine test does suggest UTI and preliminary organism is E. coli.  Kindly contact primary care physician to start antibiotics

## 2022-03-21 DIAGNOSIS — I63339 Cerebral infarction due to thrombosis of unspecified posterior cerebral artery: Secondary | ICD-10-CM | POA: Diagnosis not present

## 2022-03-21 LAB — URINE CULTURE

## 2022-03-22 ENCOUNTER — Telehealth: Payer: Self-pay | Admitting: Family Medicine

## 2022-03-22 ENCOUNTER — Telehealth: Payer: Self-pay

## 2022-03-22 DIAGNOSIS — I63339 Cerebral infarction due to thrombosis of unspecified posterior cerebral artery: Secondary | ICD-10-CM | POA: Diagnosis not present

## 2022-03-22 DIAGNOSIS — N3000 Acute cystitis without hematuria: Secondary | ICD-10-CM

## 2022-03-22 MED ORDER — CEPHALEXIN 500 MG PO CAPS: 500.0000 mg | ORAL_CAPSULE | Freq: Two times a day (BID) | ORAL | 0 refills | Status: AC

## 2022-03-22 NOTE — Telephone Encounter (Signed)
Pt states she is having concentrated urine, dysuria, spasms when her stream stops. Pt denies fever.

## 2022-03-22 NOTE — Telephone Encounter (Signed)
-----   Message from Garvin Fila, MD sent at 03/20/2022 11:52 AM EST ----- Kindly inform the patient that urine test does suggest UTI and preliminary organism is E. coli.  Kindly contact primary care physician to start antibiotics

## 2022-03-22 NOTE — Telephone Encounter (Signed)
Pt aware of provider feedback and voiced understanding. 

## 2022-03-22 NOTE — Telephone Encounter (Signed)
I reviewed urine culture obtained on 3 7 by her specialist.  As they have defer to PCP to follow-up on these labs, I have ordered Keflex p.o. twice daily for 7 days to treat UTI.  Follow-up with PCP if symptoms do not resolve or if she develops any worsening symptoms or signs suggestive of pyelonephritis

## 2022-03-22 NOTE — Telephone Encounter (Signed)
Please double check with pt.  Is she having any flank pain, nausea, vomiting, fevers?

## 2022-03-23 ENCOUNTER — Other Ambulatory Visit: Payer: Self-pay | Admitting: Family Medicine

## 2022-03-23 DIAGNOSIS — I63339 Cerebral infarction due to thrombosis of unspecified posterior cerebral artery: Secondary | ICD-10-CM | POA: Diagnosis not present

## 2022-03-23 DIAGNOSIS — N390 Urinary tract infection, site not specified: Secondary | ICD-10-CM

## 2022-03-23 MED ORDER — SULFAMETHOXAZOLE-TRIMETHOPRIM 800-160 MG PO TABS: 1.0000 | ORAL_TABLET | Freq: Two times a day (BID) | ORAL | 0 refills | Status: AC

## 2022-03-24 ENCOUNTER — Ambulatory Visit: Payer: Medicaid Other | Admitting: *Deleted

## 2022-03-24 DIAGNOSIS — I63339 Cerebral infarction due to thrombosis of unspecified posterior cerebral artery: Secondary | ICD-10-CM | POA: Diagnosis not present

## 2022-03-25 DIAGNOSIS — I63339 Cerebral infarction due to thrombosis of unspecified posterior cerebral artery: Secondary | ICD-10-CM | POA: Diagnosis not present

## 2022-03-25 DIAGNOSIS — I639 Cerebral infarction, unspecified: Secondary | ICD-10-CM | POA: Diagnosis not present

## 2022-03-26 ENCOUNTER — Ambulatory Visit: Payer: Medicaid Other | Admitting: *Deleted

## 2022-03-26 DIAGNOSIS — R262 Difficulty in walking, not elsewhere classified: Secondary | ICD-10-CM

## 2022-03-26 DIAGNOSIS — M6281 Muscle weakness (generalized): Secondary | ICD-10-CM

## 2022-03-26 DIAGNOSIS — I63339 Cerebral infarction due to thrombosis of unspecified posterior cerebral artery: Secondary | ICD-10-CM | POA: Diagnosis not present

## 2022-03-26 NOTE — Progress Notes (Signed)
Kindly advise the patient to follow-up with primary care physician for treatment of urinary tract infection.  I forwarded results to [name], FNP

## 2022-03-26 NOTE — Therapy (Signed)
OUTPATIENT PHYSICAL THERAPY NEURO TREATMENT   Patient Name: Alice [name] MRN: [id number] DOB:[birth year], 57 y.o., female Today's Date: 03/26/2022   PCP: Baruch Gouty, FNP REFERRING PROVIDER: Rosalin Hawking, MD   END OF SESSION:  PT End of Session - 03/26/22 1042     Visit Number 6    Number of Visits 10    Date for PT Re-Evaluation 04/09/22    PT Start Time 1030    PT Stop Time 1115    PT Time Calculation (min) 45 min            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   Qualifier: Diagnosis of  By: Delfino Lovett     Chronic low back pain    Cocaine abuse (HCC)    COPD (chronic obstructive pulmonary disease) ([place])    Heart murmur    HLD (hyperlipidemia)    Hypertension    Stroke ([place])    x4   Past Surgical History:  Procedure Laterality Date   ABDOMINAL HYSTERECTOMY     abd?   BACK SURGERY     hardware in back   cyst removed from ovary     FRACTURE SURGERY     LUMBAR SPINE   removal of BB in finger  2008   SALIVARY GLAND SURGERY     tumor reomoved from stomach     Patient Active Problem List   Diagnosis Date Noted   Constipation due to opioid therapy 03/12/2022   COPD  GOLD ? /  Group B still smoking 01/15/2022   GERD (gastroesophageal reflux disease) 07/24/2021   Depression, major, single episode, moderate ([place]) 10/22/2020   GAD (generalized anxiety disorder) 11/01/2018   Vitamin D deficiency 11/01/2018   H/o Prior ischemic stroke ([place]) 09/03/2016   History of stroke 05/09/2016   History of substance abuse ([place]) 05/09/2016   Heart murmur 01/08/2016   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   Dyslipidemia 10/04/2015   HTN (hypertension) 10/04/2015   Discitis 10/04/2015   Severe recurrent major depression without psychotic features ([place]) 05/08/2015   Postlaminectomy syndrome, lumbar region 12/04/2012   Anemia 06/17/2012   Chronic low back pain 06/17/2012   DDD (degenerative disc disease), lumbosacral 06/17/2012    Sacroiliitis ([place]) 06/17/2012   Cigarette smoker 12/05/2009    ONSET DATE: 01/17/22  REFERRING DIAG: Stroke-like symptoms   THERAPY DIAG:  Difficulty in walking, not elsewhere classified  Muscle weakness (generalized)  Rationale for Evaluation and Treatment: Rehabilitation  SUBJECTIVE:  SUBJECTIVE STATEMENT: Already somewhat fatigued on arrival. Doesn't feel like she's sleeping well and going to the bathroom a lot.  Pt accompanied by: self and husband  PERTINENT HISTORY: Hypertension, history of a TIA, history of multiple strokes, COPD, hepatitis C, chronic low back pain, current smoker, depression, and anxiety  PAIN:  Are you having pain? Yes: NPRS scale: 8/10 Pain location: low back Pain description: stabbing Aggravating factors: bending over, cleaning, and daily activities Relieving factors: none  PRECAUTIONS: None  PATIENT GOALS: improved function with her daily activities  OBJECTIVE:   DIAGNOSTIC FINDINGS: 01/18/22 brain MRI IMPRESSION: 1. No acute intracranial abnormality.   2. Essentially stable noncontrast MRI appearance of the brain since July, although vertex Superficial Siderosis and other scattered chronic cerebral blood products are more apparent on SWI this time vs GRE before. Appearance is nonspecific but does not rise to the level of amyloid angiopathy. Small chronic left parietal lobe infarct again noted.   SENSATION: Patient reports numbness from her mid tibia into her right foot. Diminished sensation throughout RLE with no dermatomal pattern.    POSTURE: rounded shoulders, forward head, decreased lumbar lordosis, increased thoracic kyphosis, and flexed trunk   LOWER EXTREMITY MMT:    MMT Right Eval Left Eval  Hip flexion 3+/5 4-/5  Hip extension    Hip  abduction    Hip adduction    Hip internal rotation    Hip external rotation    Knee flexion 4-/5 5/5  Knee extension 3+/5 4/5  Ankle dorsiflexion 3+/5 4-/5  Ankle plantarflexion    Ankle inversion    Ankle eversion    (Blank rows = not tested)  TRANSFERS: Assistive device utilized: Environmental consultant - 4 wheeled  Sit to stand: SBA and required upper extremity support from armrests Stand to sit: SBA and required upper extremity support from armrests for a slow and controlled movement  GAIT: Gait pattern: step through pattern, decreased step length- Right, decreased step length- Left, decreased stride length, decreased hip/knee flexion- Right, decreased hip/knee flexion- Left, Right foot flat, Left foot flat, knee flexed in stance- Right, knee flexed in stance- Left, trunk flexed, wide BOS, poor foot clearance- Right, and poor foot clearance- Left Assistive device utilized: Walker - 4 wheeled Level of assistance: Modified independence  FUNCTIONAL TESTS:  5 times sit to stand: 17 seconds; required upper extremity use for stand to sit and sit to stand transfers 03/19/22 Timed up and go (TUG): 16 seconds with rolling walker 03/19/22  TODAY'S TREATMENT:                                                                                                                              DATE:                      03/26/22     EXERCISE LOG  Exercise Repetitions and Resistance Comments  Nustep L3 x12 min   LAQ 4#  2x40 reps  Seated marching x20 reps   Clam Red x20 reps   Ball squeeze  X3 min   Heel/toe raises 3x 10 reps   Seated slouch/ overcorrect X2 min   Seated shoulder retraction  Green theraband 3x10 reps   Sit to stands X5 reps in 11 .23sec    Blank cell = exercise not performed today                                    2/9 EXERCISE LOG  Exercise Repetitions and Resistance Comments  Nustep  L3 x 10 minutes   Rocker board (seated)  4 minutes    LAQ 25 reps each    Seated marching  25 reps each     Seated clams  25 reps    Seated hip ADD isometric  25 reps w/ 5 second hold   Slouch/ overcorrect 30 reps  For postural reeducation   Scapular retraction  20 reps  For postural reeducation   Reaching overhead  15 reps  For postural reeducation    Blank cell = exercise not performed today   PATIENT EDUCATION: Education details: Plan of care, prognosis, and goals for therapy Person educated: Patient Education method: Explanation Education comprehension: verbalized understanding  HOME EXERCISE PROGRAM:  GOALS: Goals reviewed with patient? Yes  SHORT TERM GOALS: Target date: 02/12/22  Patient will be independent with her initial HEP. Baseline: Goal status: On-going  2.  Patient will improve her 5 times sit to stand time to 23 seconds or less. Baseline:  Goal status: MET  3.  Patient will improve her timed up and go to 17 seconds or less. Baseline:  Goal status: MET  LONG TERM GOALS: Target date: 03/05/22  Patient will be independent with her advanced HEP. Baseline:  Goal status: On-going  2.  Patient will improve her 5 times sit to stand time to 15 seconds or less for improved lower extremity power. Baseline:  Goal status: MET 11.23 secs  3.  Patient will improve her timed up and go time to 13 seconds or less for improved safety. Baseline:  Goal status: On-going  ASSESSMENT:  CLINICAL IMPRESSION:  Patient presented in clinic today doing fair. She was able to continue with progression of exs with increased reps and wt.s. She was also able to meet LTG for sit to stand today.      OBJECTIVE IMPAIRMENTS: Abnormal gait, decreased activity tolerance, decreased balance, decreased mobility, difficulty walking, decreased strength, hypomobility, impaired flexibility, impaired sensation, impaired tone, improper body mechanics, postural dysfunction, and pain.   ACTIVITY LIMITATIONS: carrying, lifting, bending, standing, squatting, stairs, transfers, and locomotion  level  PARTICIPATION LIMITATIONS: meal prep, cleaning, laundry, shopping, and community activity  PERSONAL FACTORS: Fitness, Past/current experiences, Social background, Transportation, and 3+ comorbidities: Hypertension, history of a TIA, history of multiple strokes, COPD, hepatitis C, chronic low back pain, current smoker, depression, and anxiety  are also affecting patient's functional outcome.   REHAB POTENTIAL: Fair    CLINICAL DECISION MAKING: Unstable/unpredictable  EVALUATION COMPLEXITY: High  PLAN:  PT FREQUENCY: 1-2x/week  PT DURATION: other: 5 weeks  PLANNED INTERVENTIONS: Therapeutic exercises, Therapeutic activity, Neuromuscular re-education, Balance training, Gait training, Patient/Family education, Self Care, Stair training, and Re-evaluation  PLAN FOR NEXT SESSION: NuStep, lower extremity strengthening, postural reeducation, and balance interventions  Madelena Maturin,CHRIS, PTA 03/26/2022, 12:26 PM

## 2022-03-27 DIAGNOSIS — I63339 Cerebral infarction due to thrombosis of unspecified posterior cerebral artery: Secondary | ICD-10-CM | POA: Diagnosis not present

## 2022-03-28 DIAGNOSIS — I63339 Cerebral infarction due to thrombosis of unspecified posterior cerebral artery: Secondary | ICD-10-CM | POA: Diagnosis not present

## 2022-03-29 ENCOUNTER — Other Ambulatory Visit: Payer: Self-pay | Admitting: Family Medicine

## 2022-03-29 DIAGNOSIS — Z1231 Encounter for screening mammogram for malignant neoplasm of breast: Secondary | ICD-10-CM

## 2022-03-29 DIAGNOSIS — I63339 Cerebral infarction due to thrombosis of unspecified posterior cerebral artery: Secondary | ICD-10-CM | POA: Diagnosis not present

## 2022-03-30 ENCOUNTER — Encounter: Payer: Medicaid Other | Admitting: Physical Therapy

## 2022-03-30 DIAGNOSIS — I63339 Cerebral infarction due to thrombosis of unspecified posterior cerebral artery: Secondary | ICD-10-CM | POA: Diagnosis not present

## 2022-03-31 ENCOUNTER — Telehealth: Payer: Self-pay | Admitting: Family Medicine

## 2022-03-31 ENCOUNTER — Ambulatory Visit
Admission: RE | Admit: 2022-03-31 | Discharge: 2022-03-31 | Disposition: A | Discharge status: Home / Self Care | Payer: Medicaid Other | Source: Ambulatory Visit | Attending: Family Medicine | Admitting: Family Medicine

## 2022-03-31 DIAGNOSIS — I63339 Cerebral infarction due to thrombosis of unspecified posterior cerebral artery: Secondary | ICD-10-CM | POA: Diagnosis not present

## 2022-03-31 DIAGNOSIS — Z1231 Encounter for screening mammogram for malignant neoplasm of breast: Secondary | ICD-10-CM | POA: Diagnosis not present

## 2022-03-31 NOTE — Telephone Encounter (Signed)
Patient aware and verbalizes understanding. 

## 2022-04-01 DIAGNOSIS — I63339 Cerebral infarction due to thrombosis of unspecified posterior cerebral artery: Secondary | ICD-10-CM | POA: Diagnosis not present

## 2022-04-02 ENCOUNTER — Ambulatory Visit: Payer: Medicaid Other

## 2022-04-02 DIAGNOSIS — R262 Difficulty in walking, not elsewhere classified: Secondary | ICD-10-CM

## 2022-04-02 DIAGNOSIS — M6281 Muscle weakness (generalized): Secondary | ICD-10-CM

## 2022-04-02 DIAGNOSIS — I63339 Cerebral infarction due to thrombosis of unspecified posterior cerebral artery: Secondary | ICD-10-CM | POA: Diagnosis not present

## 2022-04-02 NOTE — Therapy (Signed)
OUTPATIENT PHYSICAL THERAPY NEURO TREATMENT   Patient Name: Alice [name] MRN: [id number] DOB:1965/02/02, 57 y.o., female Today's Date: 04/02/2022   PCP: Baruch Gouty, FNP REFERRING PROVIDER: Rosalin Hawking, MD   END OF SESSION:  PT End of Session - 04/02/22 1105     Visit Number 7    Number of Visits 10    Date for PT Re-Evaluation 04/09/22    PT Start Time 1103    PT Stop Time 1145    PT Time Calculation (min) 42 min    Activity Tolerance Patient tolerated treatment well    Behavior During Therapy [facility] for tasks assessed/performed             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   Qualifier: Diagnosis of  By: Delfino Lovett     Chronic low back pain    Cocaine abuse (HCC)    COPD (chronic obstructive pulmonary disease) ([place])    Heart murmur    HLD (hyperlipidemia)    Hypertension    Stroke ([place])    x4   Past Surgical History:  Procedure Laterality Date   ABDOMINAL HYSTERECTOMY     abd?   BACK SURGERY     hardware in back   cyst removed from ovary     FRACTURE SURGERY     LUMBAR SPINE   removal of BB in finger  2008   SALIVARY GLAND SURGERY     tumor reomoved from stomach     Patient Active Problem List   Diagnosis Date Noted   Constipation due to opioid therapy 03/12/2022   COPD  GOLD ? /  Group B still smoking 01/15/2022   GERD (gastroesophageal reflux disease) 07/24/2021   Depression, major, single episode, moderate ([place]) 10/22/2020   GAD (generalized anxiety disorder) 11/01/2018   Vitamin D deficiency 11/01/2018   H/o Prior ischemic stroke ([place]) 09/03/2016   History of stroke 05/09/2016   History of substance abuse ([place]) 05/09/2016   Heart murmur 01/08/2016   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   Dyslipidemia 10/04/2015   HTN (hypertension) 10/04/2015   Discitis 10/04/2015   Severe recurrent major depression without psychotic features ([place]) 05/08/2015   Postlaminectomy syndrome, lumbar region 12/04/2012    Anemia 06/17/2012   Chronic low back pain 06/17/2012   DDD (degenerative disc disease), lumbosacral 06/17/2012   Sacroiliitis ([place]) 06/17/2012   Cigarette smoker 12/05/2009    ONSET DATE: 01/17/22  REFERRING DIAG: Stroke-like symptoms   THERAPY DIAG:  Difficulty in walking, not elsewhere classified  Muscle weakness (generalized)  Rationale for Evaluation and Treatment: Rehabilitation  SUBJECTIVE:  SUBJECTIVE STATEMENT: Patient reports that she is always hurting and her pain is around 8/10. Pt accompanied by: self  PERTINENT HISTORY: Hypertension, history of a TIA, history of multiple strokes, COPD, hepatitis C, chronic low back pain, current smoker, depression, and anxiety  PAIN:  Are you having pain? Yes: NPRS scale: 8/10 Pain location: low back Pain description: stabbing Aggravating factors: bending over, cleaning, and daily activities Relieving factors: none  PRECAUTIONS: None  PATIENT GOALS: improved function with her daily activities  OBJECTIVE:   DIAGNOSTIC FINDINGS: 01/18/22 brain MRI IMPRESSION: 1. No acute intracranial abnormality.   2. Essentially stable noncontrast MRI appearance of the brain since July, although vertex Superficial Siderosis and other scattered chronic cerebral blood products are more apparent on SWI this time vs GRE before. Appearance is nonspecific but does not rise to the level of amyloid angiopathy. Small chronic left parietal lobe infarct again noted.   SENSATION: Patient reports numbness from her mid tibia into her right foot. Diminished sensation throughout RLE with no dermatomal pattern.    POSTURE: rounded shoulders, forward head, decreased lumbar lordosis, increased thoracic kyphosis, and flexed trunk   LOWER EXTREMITY MMT:    MMT Right Eval  Left Eval  Hip flexion 3+/5 4-/5  Hip extension    Hip abduction    Hip adduction    Hip internal rotation    Hip external rotation    Knee flexion 4-/5 5/5  Knee extension 3+/5 4/5  Ankle dorsiflexion 3+/5 4-/5  Ankle plantarflexion    Ankle inversion    Ankle eversion    (Blank rows = not tested)  TRANSFERS: Assistive device utilized: Environmental consultant - 4 wheeled  Sit to stand: SBA and required upper extremity support from armrests Stand to sit: SBA and required upper extremity support from armrests for a slow and controlled movement  GAIT: Gait pattern: step through pattern, decreased step length- Right, decreased step length- Left, decreased stride length, decreased hip/knee flexion- Right, decreased hip/knee flexion- Left, Right foot flat, Left foot flat, knee flexed in stance- Right, knee flexed in stance- Left, trunk flexed, wide BOS, poor foot clearance- Right, and poor foot clearance- Left Assistive device utilized: Walker - 4 wheeled Level of assistance: Modified independence  FUNCTIONAL TESTS:  5 times sit to stand: 17 seconds; required upper extremity use for stand to sit and sit to stand transfers 03/19/22 Timed up and go (TUG): 16 seconds with rolling walker 03/19/22  TODAY'S TREATMENT:                                                                                                                              DATE:                                   3/22 EXERCISE LOG  Exercise Repetitions and Resistance Comments  Nustep  L3 x 14 minutes    Gastroc stretch  3 x 30 seconds   Marching on foam  20 reps each    Slouch overcorrect (standing)  25 reps    Seated hip ADD isometric  3 minutes   LAQ 4# x 2 minutes   Seated heel/toe raises  4# x 3 minutes    Seated clams Red t-band x 3 minutes    Blank cell = exercise not performed today                         03/26/22     EXERCISE LOG  Exercise Repetitions and Resistance Comments  Nustep L3 x12 min   LAQ 4#  2x40 reps   Seated  marching x20 reps   Clam Red x20 reps   Ball squeeze  X3 min   Heel/toe raises 3x 10 reps   Seated slouch/ overcorrect X2 min   Seated shoulder retraction  Green theraband 3x10 reps   Sit to stands X5 reps in 11 .23sec    Blank cell = exercise not performed today                                    2/9 EXERCISE LOG  Exercise Repetitions and Resistance Comments  Nustep  L3 x 10 minutes   Rocker board (seated)  4 minutes    LAQ 25 reps each    Seated marching  25 reps each    Seated clams  25 reps    Seated hip ADD isometric  25 reps w/ 5 second hold   Slouch/ overcorrect 30 reps  For postural reeducation   Scapular retraction  20 reps  For postural reeducation   Reaching overhead  15 reps  For postural reeducation    Blank cell = exercise not performed today   PATIENT EDUCATION: Education details: Plan of care, prognosis, and goals for therapy Person educated: Patient Education method: Explanation Education comprehension: verbalized understanding  HOME EXERCISE PROGRAM:  GOALS: Goals reviewed with patient? Yes  SHORT TERM GOALS: Target date: 02/12/22  Patient will be independent with her initial HEP. Baseline: Goal status: On-going  2.  Patient will improve her 5 times sit to stand time to 23 seconds or less. Baseline:  Goal status: MET  3.  Patient will improve her timed up and go to 17 seconds or less. Baseline:  Goal status: MET  LONG TERM GOALS: Target date: 03/05/22  Patient will be independent with her advanced HEP. Baseline:  Goal status: On-going  2.  Patient will improve her 5 times sit to stand time to 15 seconds or less for improved lower extremity power. Baseline:  Goal status: MET 11.23 secs  3.  Patient will improve her timed up and go time to 13 seconds or less for improved safety. Baseline:  Goal status: On-going  ASSESSMENT:  CLINICAL IMPRESSION: Patient was progressed with multiple new standing interventions for postural reeducation. She  required minimal to moderate cueing with standing slouch overcorrect for proper exercise performance to promote upright stance. She reported no significant increase in pain or discomfort with any of today's interventions. She reported feeling tired upon the conclusion of treatment. She continues to require skilled physical therapy to address her impairments to maximize her functional mobility.   OBJECTIVE IMPAIRMENTS: Abnormal gait, decreased activity tolerance, decreased balance, decreased mobility, difficulty walking, decreased strength, hypomobility, impaired flexibility, impaired sensation, impaired tone, improper body mechanics, postural dysfunction, and  pain.   ACTIVITY LIMITATIONS: carrying, lifting, bending, standing, squatting, stairs, transfers, and locomotion level  PARTICIPATION LIMITATIONS: meal prep, cleaning, laundry, shopping, and community activity  PERSONAL FACTORS: Fitness, Past/current experiences, Social background, Transportation, and 3+ comorbidities: Hypertension, history of a TIA, history of multiple strokes, COPD, hepatitis C, chronic low back pain, current smoker, depression, and anxiety  are also affecting patient's functional outcome.   REHAB POTENTIAL: Fair    CLINICAL DECISION MAKING: Unstable/unpredictable  EVALUATION COMPLEXITY: High  PLAN:  PT FREQUENCY: 1-2x/week  PT DURATION: other: 5 weeks  PLANNED INTERVENTIONS: Therapeutic exercises, Therapeutic activity, Neuromuscular re-education, Balance training, Gait training, Patient/Family education, Self Care, Stair training, and Re-evaluation  PLAN FOR NEXT SESSION: NuStep, lower extremity strengthening, postural reeducation, and balance interventions  Darlin Coco, PT 04/02/2022, 12:08 PM

## 2022-04-03 DIAGNOSIS — I63339 Cerebral infarction due to thrombosis of unspecified posterior cerebral artery: Secondary | ICD-10-CM | POA: Diagnosis not present

## 2022-04-04 DIAGNOSIS — I63339 Cerebral infarction due to thrombosis of unspecified posterior cerebral artery: Secondary | ICD-10-CM | POA: Diagnosis not present

## 2022-04-05 ENCOUNTER — Other Ambulatory Visit: Payer: Self-pay | Admitting: Family Medicine

## 2022-04-05 DIAGNOSIS — I63339 Cerebral infarction due to thrombosis of unspecified posterior cerebral artery: Secondary | ICD-10-CM | POA: Diagnosis not present

## 2022-04-05 NOTE — Telephone Encounter (Signed)
  Prescription Request  04/05/2022  Is this a "Controlled Substance" medicine? no  Have you seen your PCP in the last 2 weeks? No, this mediation was prescribed while she was in hospital, she also had labs done here and was told her cholesterol was very low wants to know if Sharyn Lull can refill this  If YES, route message to pool  -  If NO, patient needs to be scheduled for appointment.  What is the name of the medication or equipment? atorvastatin (LIPITOR) 40 MG tablet   Have you contacted your pharmacy to request a refill? yes   Which pharmacy would you like this sent to? [facility]    Patient notified that their request is being sent to the clinical staff for review and that they should receive a response within 2 business days.

## 2022-04-06 ENCOUNTER — Telehealth: Payer: Self-pay | Admitting: Family Medicine

## 2022-04-06 ENCOUNTER — Other Ambulatory Visit: Payer: Self-pay | Admitting: Family Medicine

## 2022-04-06 DIAGNOSIS — I63339 Cerebral infarction due to thrombosis of unspecified posterior cerebral artery: Secondary | ICD-10-CM | POA: Diagnosis not present

## 2022-04-06 IMAGING — MR MR HEAD W/O CM
11 of 12 series · 40 of 48 positions shown · non-contrast
Comparison: None.

CLINICAL DATA: Neuro deficit, acute, stroke suspected

EXAM:
MRI HEAD WITHOUT CONTRAST
TECHNIQUE: Multiplanar, multiecho pulse sequences of the brain and surrounding
structures were obtained without intravenous contrast.

[Series 5: DWI · axial · 4.0mm · 0.88mm/px · z∈[-35,+93]mm · 4 of 36 slices shown (1 of 6)]
[im 1/36]
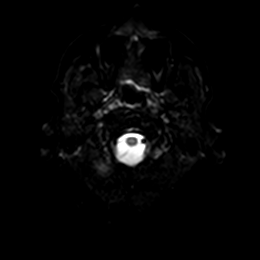
[im 12/36]
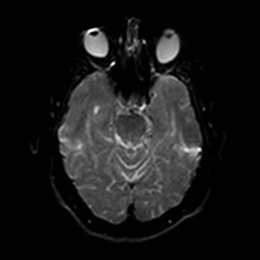
[im 24/36]
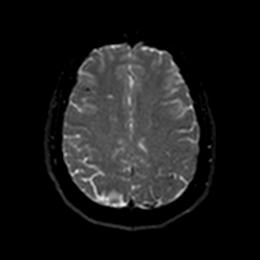
[im 36/36]
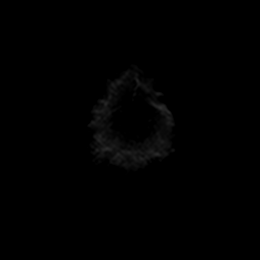

[Series 5: DWI · axial · 4.0mm · 0.88mm/px · z∈[-35,+93]mm · 4 of 36 slices shown (2 of 6)]
[im 1/36]
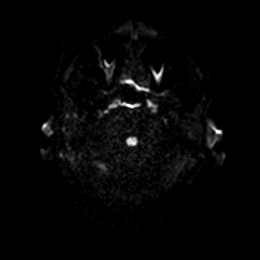
[im 12/36]
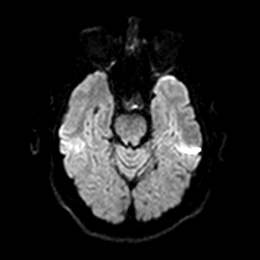
[im 24/36]
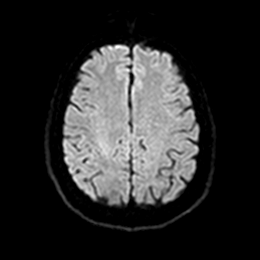
[im 36/36]
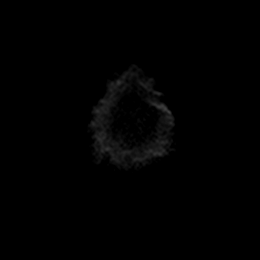

[Series 6: DWI · axial · 4.0mm · 0.88mm/px · z∈[-35,+93]mm · 4 of 36 slices shown (3 of 6)]
[im 1/36]
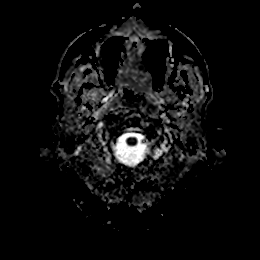
[im 12/36]
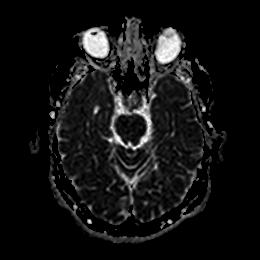
[im 24/36]
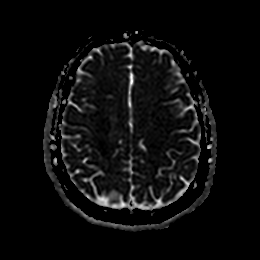
[im 36/36]
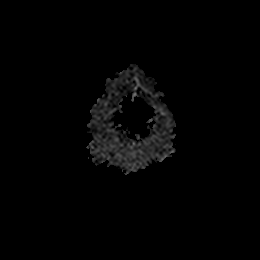

[Series 7: DWI · coronal · 5.0mm · 0.88mm/px · 3 of 28 slices shown (4 of 6)]
[im 1/28]
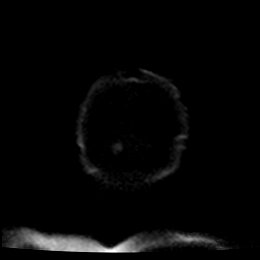
[im 14/28]
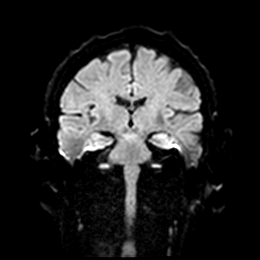
[im 28/28]
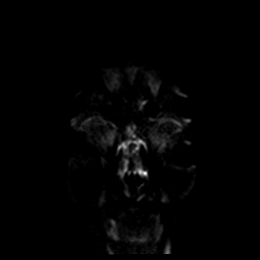

[Series 7: DWI · coronal · 5.0mm · 0.88mm/px · 4 of 28 slices shown (5 of 6)]
[im 1/28]
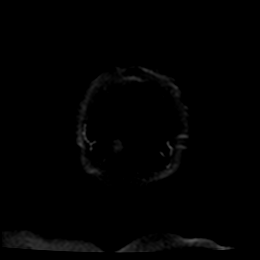
[im 10/28]
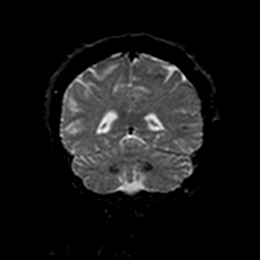
[im 19/28]
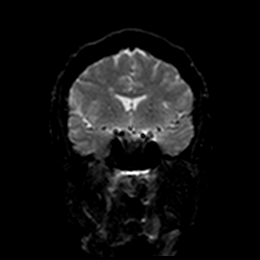
[im 28/28]
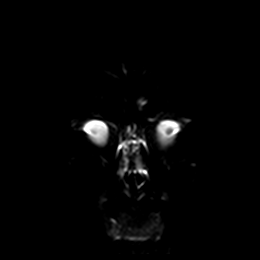

[Series 8: DWI · coronal · 5.0mm · 0.88mm/px · 4 of 28 slices shown (6 of 6)]
[im 1/28]
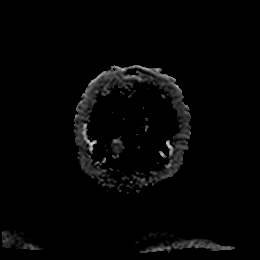
[im 10/28]
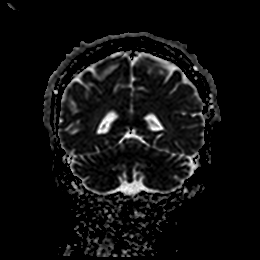
[im 19/28]
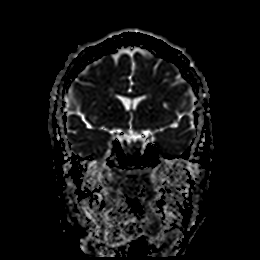
[im 28/28]
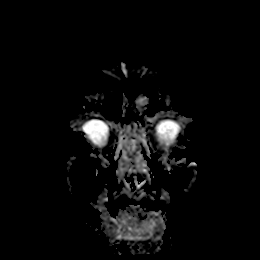

[Series 9: T1 · sagittal · 5.0mm · 0.94mm/px · 3 of 25 slices shown]
[im 1/25]
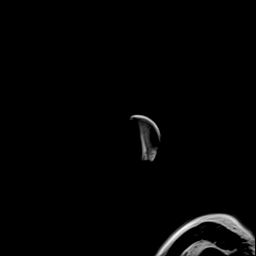
[im 13/25]
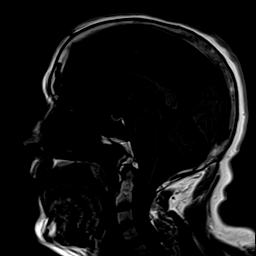
[im 25/25]
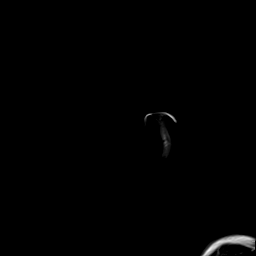

[Series 10: T2 · axial · 5.0mm · 0.72mm/px · z∈[-32,+90]mm · 3 of 20 slices shown (1 of 2)]
[im 1/20]
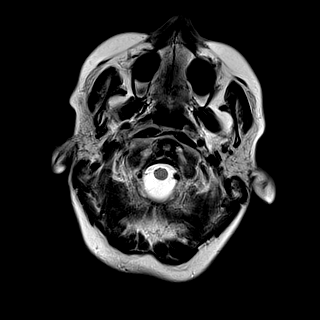
[im 10/20]
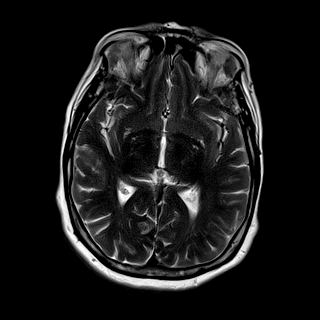
[im 20/20]
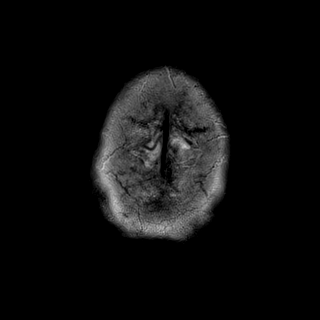

[Series 11: ax hemo · axial · 5.0mm · 0.86mm/px · z∈[-37,+95]mm · 3 of 25 slices shown]
[im 1/25]
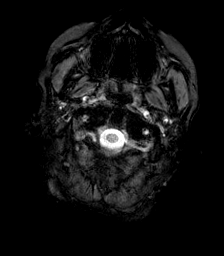
[im 13/25]
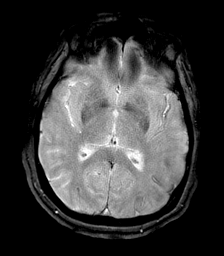
[im 25/25]
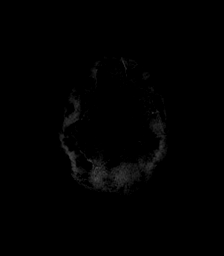

[Series 12: FLAIR · axial · 4.0mm · 0.43mm/px · z∈[-28,+85]mm · 4 of 32 slices shown]
[im 1/32]
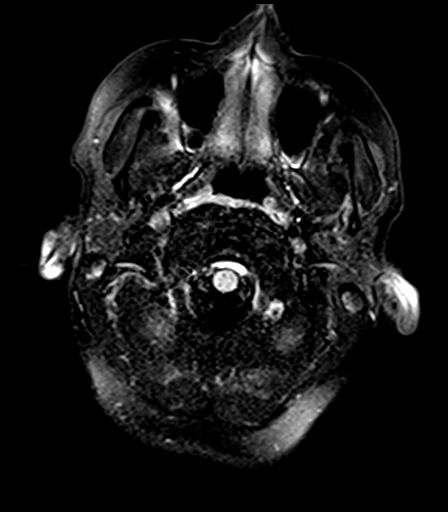
[im 11/32]
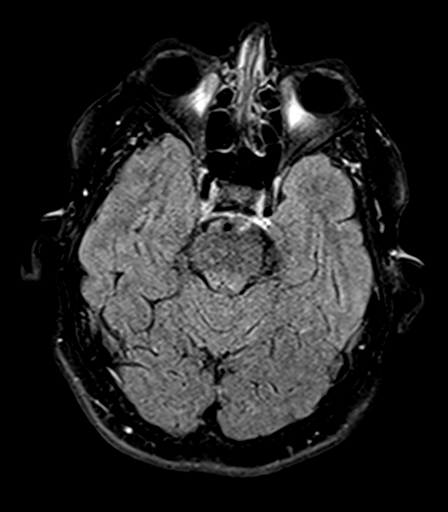
[im 21/32]
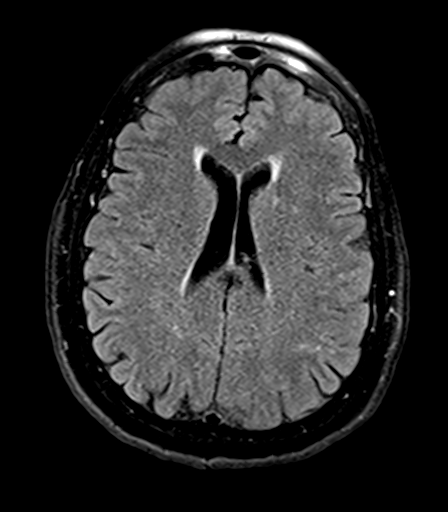
[im 32/32]
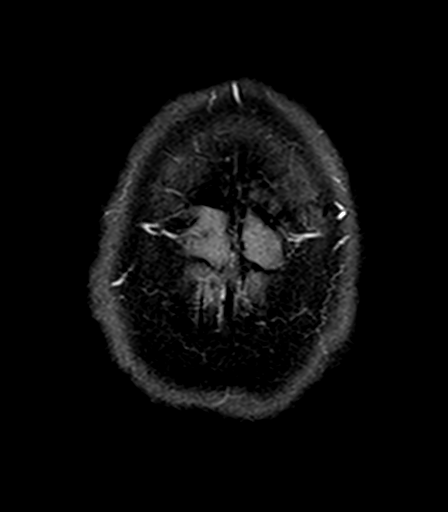

[Series 14: T2 · coronal · 5.0mm · 0.72mm/px · 4 of 28 slices shown (2 of 2)]
[im 1/28]
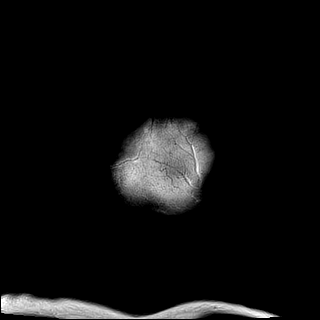
[im 10/28]
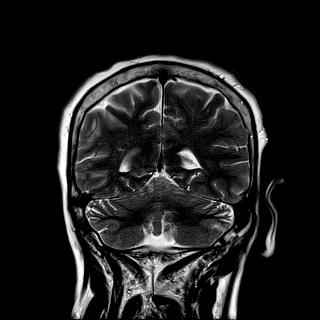
[im 19/28]
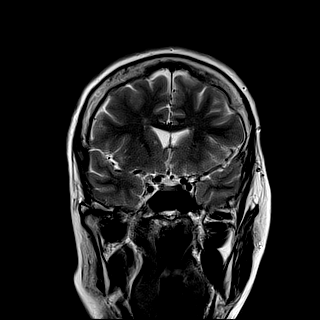
[im 28/28]
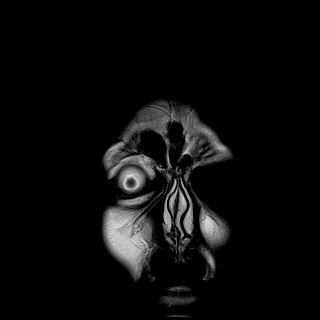

[40 of 48 positions shown; findings below may reference images not displayed]

FINDINGS: Brain: There is no acute infarction or intracranial hemorrhage.
There is no intracranial mass, mass effect, or edema. There is no
hydrocephalus or extra-axial fluid collection. There are a few
scattered foci of cortical/subcortical susceptibility likely
reflecting chronic blood products. Two of these areas are associated
with left parietal infarcts that were seen on the CT. A few
additional small foci of T2 hyperintensity in the supratentorial
white matter are nonspecific may reflect minor chronic microvascular
ischemic changes. Ventricles and sulci are normal in size and
configuration.

Vascular: Major vessel flow voids at the skull base are preserved.

Skull and upper cervical spine: Normal marrow signal is preserved.

Sinuses/Orbits: Paranasal sinuses are aerated. Orbits are
unremarkable.

Other: Sella is unremarkable.  Mastoid air cells are clear.
IMPRESSION: No acute infarction, hemorrhage, or mass.

Two small chronic left parietal infarcts with chronic blood
products. Few additional foci of chronic cortical/subcortical blood
products are present.

Minor chronic microvascular ischemic changes.

## 2022-04-06 MED ORDER — ATORVASTATIN CALCIUM 40 MG PO TABS: 40.0000 mg | ORAL_TABLET | Freq: Every day | ORAL | 3 refills | Status: DC

## 2022-04-06 NOTE — Telephone Encounter (Signed)
Aware refill sent to pharmacy ?

## 2022-04-06 NOTE — Telephone Encounter (Signed)
REFERRAL REQUEST Telephone Note  Have you been seen at our office for this problem? yes (Advise that they may need an appointment with their PCP before a referral can be done)  Reason for Referral: yeast under arms and breasts that burns her Referral discussed with patient: yes  Best contact number of patient for referral team: [phone]    Has patient been seen by a specialist for this issue before: no, wants to be referred to a bladder specialist  Patient provider preference for referral: closest one to pt Patient location preference for referral: closest one to pt   Patient notified that referrals can take up to a week or longer to process. If they haven't heard anything within a week they should call back and speak with the referral department.

## 2022-04-07 ENCOUNTER — Encounter: Payer: Self-pay | Admitting: Family Medicine

## 2022-04-07 ENCOUNTER — Ambulatory Visit: Payer: Medicaid Other | Admitting: Internal Medicine

## 2022-04-07 DIAGNOSIS — I63339 Cerebral infarction due to thrombosis of unspecified posterior cerebral artery: Secondary | ICD-10-CM | POA: Diagnosis not present

## 2022-04-08 ENCOUNTER — Ambulatory Visit: Payer: Medicaid Other | Admitting: Physical Therapy

## 2022-04-08 ENCOUNTER — Encounter: Payer: Self-pay | Admitting: Physical Therapy

## 2022-04-08 DIAGNOSIS — R262 Difficulty in walking, not elsewhere classified: Secondary | ICD-10-CM

## 2022-04-08 DIAGNOSIS — M6281 Muscle weakness (generalized): Secondary | ICD-10-CM

## 2022-04-08 DIAGNOSIS — I63339 Cerebral infarction due to thrombosis of unspecified posterior cerebral artery: Secondary | ICD-10-CM | POA: Diagnosis not present

## 2022-04-08 NOTE — Therapy (Signed)
OUTPATIENT PHYSICAL THERAPY NEURO TREATMENT   Patient Name: Alice [name] MRN: [id number] DOB:July 13, 1965, 57 y.o., female Today's Date: 04/08/2022   PCP: Baruch Gouty, FNP REFERRING PROVIDER: Rosalin Hawking, MD   END OF SESSION:  PT End of Session - 04/08/22 1257     Visit Number 8    Number of Visits 10    Date for PT Re-Evaluation 04/09/22    PT Start Time 1304    PT Stop Time 1342    PT Time Calculation (min) 38 min    Activity Tolerance Patient tolerated treatment well    Behavior During Therapy [facility] for tasks assessed/performed            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   Qualifier: Diagnosis of  By: Delfino Lovett     Chronic low back pain    Cocaine abuse (HCC)    COPD (chronic obstructive pulmonary disease) ([place])    Heart murmur    HLD (hyperlipidemia)    Hypertension    Stroke ([place])    x4   Past Surgical History:  Procedure Laterality Date   ABDOMINAL HYSTERECTOMY     abd?   BACK SURGERY     hardware in back   cyst removed from ovary     FRACTURE SURGERY     LUMBAR SPINE   removal of BB in finger  2008   SALIVARY GLAND SURGERY     tumor reomoved from stomach     Patient Active Problem List   Diagnosis Date Noted   Constipation due to opioid therapy 03/12/2022   COPD  GOLD ? /  Group B still smoking 01/15/2022   GERD (gastroesophageal reflux disease) 07/24/2021   Depression, major, single episode, moderate ([place]) 10/22/2020   GAD (generalized anxiety disorder) 11/01/2018   Vitamin D deficiency 11/01/2018   H/o Prior ischemic stroke ([place]) 09/03/2016   History of stroke 05/09/2016   History of substance abuse ([place]) 05/09/2016   Heart murmur 01/08/2016   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   Dyslipidemia 10/04/2015   HTN (hypertension) 10/04/2015   Discitis 10/04/2015   Severe recurrent major depression without psychotic features ([place]) 05/08/2015   Postlaminectomy syndrome, lumbar region 12/04/2012    Anemia 06/17/2012   Chronic low back pain 06/17/2012   DDD (degenerative disc disease), lumbosacral 06/17/2012   Sacroiliitis ([place]) 06/17/2012   Cigarette smoker 12/05/2009   ONSET DATE: 01/17/22  REFERRING DIAG: Stroke-like symptoms   THERAPY DIAG:  Difficulty in walking, not elsewhere classified  Muscle weakness (generalized)  Rationale for Evaluation and Treatment: Rehabilitation  SUBJECTIVE:  SUBJECTIVE STATEMENT: Doesn't want to use UE due to rash. Pt accompanied by: self  PERTINENT HISTORY: Hypertension, history of a TIA, history of multiple strokes, COPD, hepatitis C, chronic low back pain, current smoker, depression, and anxiety  PAIN:  Are you having pain? Yes: NPRS scale: 7/10 Pain location: low back Pain description: stabbing Aggravating factors: bending over, cleaning, and daily activities Relieving factors: none  PRECAUTIONS: None  PATIENT GOALS: improved function with her daily activities  OBJECTIVE:   DIAGNOSTIC FINDINGS: 01/18/22 brain MRI IMPRESSION: 1. No acute intracranial abnormality.   2. Essentially stable noncontrast MRI appearance of the brain since July, although vertex Superficial Siderosis and other scattered chronic cerebral blood products are more apparent on SWI this time vs GRE before. Appearance is nonspecific but does not rise to the level of amyloid angiopathy. Small chronic left parietal lobe infarct again noted.   SENSATION: Patient reports numbness from her mid tibia into her right foot. Diminished sensation throughout RLE with no dermatomal pattern.    POSTURE: rounded shoulders, forward head, decreased lumbar lordosis, increased thoracic kyphosis, and flexed trunk   LOWER EXTREMITY MMT:    MMT Right Eval Left Eval  Hip flexion 3+/5 4-/5   Hip extension    Hip abduction    Hip adduction    Hip internal rotation    Hip external rotation    Knee flexion 4-/5 5/5  Knee extension 3+/5 4/5  Ankle dorsiflexion 3+/5 4-/5  Ankle plantarflexion    Ankle inversion    Ankle eversion    (Blank rows = not tested)  TRANSFERS: Assistive device utilized: Environmental consultant - 4 wheeled  Sit to stand: SBA and required upper extremity support from armrests Stand to sit: SBA and required upper extremity support from armrests for a slow and controlled movement  GAIT: Gait pattern: step through pattern, decreased step length- Right, decreased step length- Left, decreased stride length, decreased hip/knee flexion- Right, decreased hip/knee flexion- Left, Right foot flat, Left foot flat, knee flexed in stance- Right, knee flexed in stance- Left, trunk flexed, wide BOS, poor foot clearance- Right, and poor foot clearance- Left Assistive device utilized: Walker - 4 wheeled Level of assistance: Modified independence  FUNCTIONAL TESTS:  5 times sit to stand: 17 seconds; required upper extremity use for stand to sit and sit to stand transfers 03/19/22 Timed up and go (TUG): 16 seconds with rolling walker 03/19/22  TODAY'S TREATMENT:                                                                                                                              DATE:                         3/28   EXERCISE LOG  Exercise Repetitions and Resistance Comments  Bike  L4 x16 min   LAQ 4# x20 reps each  VC to improve seated posture  HS curl Green theraband x30  reps   Clam Green theraband x30 reps   Forward step ups BLE 6" step x15 reps   B heel/toe raises X20 reps   Sidestepping // bars x4 RT red theraband    Sit to stand X10 reps UE support    Blank cell = exercise not performed today                                    3/22 EXERCISE LOG  Exercise Repetitions and Resistance Comments  Nustep  L3 x 14 minutes    Gastroc stretch  3 x 30 seconds   Marching on foam   20 reps each    Slouch overcorrect (standing)  25 reps    Seated hip ADD isometric  3 minutes   LAQ 4# x 2 minutes   Seated heel/toe raises  4# x 3 minutes    Seated clams Red t-band x 3 minutes    Blank cell = exercise not performed today   PATIENT EDUCATION: Education details: Plan of care, prognosis, and goals for therapy Person educated: Patient Education method: Explanation Education comprehension: verbalized understanding  HOME EXERCISE PROGRAM:  GOALS: Goals reviewed with patient? Yes  SHORT TERM GOALS: Target date: 02/12/22  Patient will be independent with her initial HEP. Baseline: Goal status: On-going  2.  Patient will improve her 5 times sit to stand time to 23 seconds or less. Baseline:  Goal status: MET  3.  Patient will improve her timed up and go to 17 seconds or less. Baseline:  Goal status: MET  LONG TERM GOALS: Target date: 03/05/22  Patient will be independent with her advanced HEP. Baseline:  Goal status: On-going  2.  Patient will improve her 5 times sit to stand time to 15 seconds or less for improved lower extremity power. Baseline:  Goal status: MET 11.23 secs  3.  Patient will improve her timed up and go time to 13 seconds or less for improved safety. Baseline:  Goal status: On-going  ASSESSMENT:  CLINICAL IMPRESSION: Patient limited with treatment today as she has had a breakout of a rash again under her arms. Patient progressed to more functional standing exercises but limited due to prolonged standing and fatigue that occurs quickly per patient report. Patient states that she is going to be moving and her new house only has one step but does have a basement. Patient very fatigued by end of treatment session.  OBJECTIVE IMPAIRMENTS: Abnormal gait, decreased activity tolerance, decreased balance, decreased mobility, difficulty walking, decreased strength, hypomobility, impaired flexibility, impaired sensation, impaired tone, improper body  mechanics, postural dysfunction, and pain.   ACTIVITY LIMITATIONS: carrying, lifting, bending, standing, squatting, stairs, transfers, and locomotion level  PARTICIPATION LIMITATIONS: meal prep, cleaning, laundry, shopping, and community activity  PERSONAL FACTORS: Fitness, Past/current experiences, Social background, Transportation, and 3+ comorbidities: Hypertension, history of a TIA, history of multiple strokes, COPD, hepatitis C, chronic low back pain, current smoker, depression, and anxiety  are also affecting patient's functional outcome.   REHAB POTENTIAL: Fair    CLINICAL DECISION MAKING: Unstable/unpredictable  EVALUATION COMPLEXITY: High  PLAN:  PT FREQUENCY: 1-2x/week  PT DURATION: other: 5 weeks  PLANNED INTERVENTIONS: Therapeutic exercises, Therapeutic activity, Neuromuscular re-education, Balance training, Gait training, Patient/Family education, Self Care, Stair training, and Re-evaluation  PLAN FOR NEXT SESSION: NuStep, lower extremity strengthening, postural reeducation, and balance interventions  Standley Brooking, PTA 04/08/2022, 2:07 PM

## 2022-04-09 ENCOUNTER — Encounter: Payer: Self-pay | Admitting: *Deleted

## 2022-04-09 ENCOUNTER — Other Ambulatory Visit: Payer: Medicaid Other | Admitting: *Deleted

## 2022-04-09 DIAGNOSIS — I63339 Cerebral infarction due to thrombosis of unspecified posterior cerebral artery: Secondary | ICD-10-CM | POA: Diagnosis not present

## 2022-04-09 NOTE — Patient Instructions (Signed)
Visit Information  Alice [name] was given information about Medicaid Managed Care team care coordination services as a part of their [place] Medicaid benefit. Alice [name] verbally consented to engagement with the [facility] Managed Care team.   If you are experiencing a medical emergency, please call 911 or report to your local emergency department or urgent care.   If you have a non-emergency medical problem during routine business hours, please contact your provider's office and ask to speak with a nurse.   For questions related to your [facility], please call: [phone] or visit the homepage here: [email or web address]  If you would like to schedule transportation through your [facility], please call the following number at least 2 days in advance of your appointment: [phone]   Rides for urgent appointments can also be made after hours by calling Member Services.  Call the [place] at [phone], at any time, 24 hours a day, 7 days a week. If you are in danger or need immediate medical attention call 911.  If you would like help to quit smoking, call 1-800-QUIT-NOW 819-840-3926) OR Espaol: 1-855-Djelo-Ya HD:1601594) o para ms informacin haga clic aqu or Text READY to 200-400 to register via text  Alice [name],   Please see education materials related to stroke provided by MyChart link.  Patient verbalizes understanding of instructions and care plan provided today and agrees to view in [place]. Active MyChart status and patient understanding of how to access instructions and care plan via MyChart confirmed with patient.     Telephone follow up appointment with Managed Medicaid care management team member scheduled for:06/16/22 @ 1:15pm  Alice Joiner RN, [place] [facility] RN Care  Coordinator [phone]   Following is a copy of your plan of care:  Care Plan : RN Care Manager Plan of Care  Updates made by Melissa Montane, RN since 04/09/2022 12:00 AM     Problem: Health Management needs related to Stroke History      Long-Range Goal: Development of Plan of Care to address Health Management needs related to Stroke History   Start Date: 03/10/2022  Expected End Date: 07/08/2022  Note:   Current Barriers:  Chronic Disease Management support and education needs related to Stroke History  RNCM Clinical Goal(s):  Patient will verbalize understanding of plan for management of Stroke History as evidenced by patient reports take all medications exactly as prescribed and will call provider for medication related questions as evidenced by patient reports and EMR documentation    attend all scheduled medical appointments: 04/14/22 with PCP, 04/15/22 with BSW, PT on 04/15/22 and 04/22/22 and Pulmonology on 04/28/22 as evidenced by provider documentation        work with Education officer, museum to address Financial constraints related to utilities, food and housing resources related to the management of Stroke History as evidenced by review of EMR and patient or Education officer, museum report     through collaboration with Consulting civil engineer, provider, and care team   Interventions: Inter-disciplinary care team collaboration (see longitudinal plan of care) Evaluation of current treatment plan related to  self management and patient's adherence to plan as established by provider Discussed patient has located new housing and will move in on 04/26/22  Stroke:  (Status:Goal on track:  Yes.) Long Term Goal Reviewed Importance of taking all medications as prescribed Reviewed Importance of attending all scheduled provider appointments Advised to report any changes in  symptoms or exercise tolerance Assessed social determinant of health barriers Reviewed referrals to outpatient therapy Reviewed the importance of  exercise Advised patient to schedule with PT for continued care Collaborated with PCP to request needed DME, shower chair Provided encouragement for patient on her sobriety Reviewed Neurology provider notes and discussed with patient  Patient Goals/Self-Care Activities: Take medications as prescribed   Attend all scheduled provider appointments Call provider office for new concerns or questions  Work with the social worker to address care coordination needs and will continue to work with the clinical team to address health care and disease management related needs

## 2022-04-09 NOTE — Patient Outreach (Signed)
Medicaid Managed Care   Nurse Care Manager Note  04/09/2022 Name:  Alice [name] MRN:  [id number] DOB:  July 09, 1965  Alice [name] is an 57 y.o. year old female who is a primary patient of Alice [name].  The [facility] Managed Care Coordination team was consulted for assistance with:    Stroke history  [name] was given information about Medicaid Managed Care Coordination team services today. Alice [name] Patient agreed to services and verbal consent obtained.  Engaged with patient by telephone for follow up visit in response to provider referral for case management and/or care coordination services.   Assessments/Interventions:  Review of past medical history, allergies, medications, health status, including review of consultants reports, laboratory and other test data, was performed as part of comprehensive evaluation and provision of chronic care management services.  SDOH (Social Determinants of Health) assessments and interventions performed: SDOH Interventions    Flowsheet Row Office Visit from 03/12/2022 in [place] Most recent reading at 03/12/2022 11:09 AM Patient Outreach Telephone from 03/10/2022 in [place] Most recent reading at 03/10/2022  2:42 PM Patient Outreach Telephone from 02/26/2022 in [place] Most recent reading at 02/26/2022  2:48 PM Telephone from 01/29/2022 in [place] Most recent reading at 01/29/2022  1:11 PM Office Visit from 01/29/2022 in [place] Most recent reading at 01/29/2022  9:31 AM Office Visit from 08/14/2021 in [place] Most recent reading at 08/14/2021 12:16 PM  SDOH Interventions        Food Insecurity Interventions -- -- Other (Comment)  [Collaborate with BSW] Intervention Not Indicated -- --  Housing Interventions -- Other (Comment)  [BSW  referral for housing resources] -- -- -- --  Transportation Interventions -- -- Payor Benefit Intervention Not Indicated -- --  Utilities Interventions -- -- Intervention Not Indicated -- -- --  Depression Interventions/Treatment  Currently on Treatment -- -- -- Currently on Treatment Patient refuses Treatment       Care Plan  Allergies  Allergen Reactions   Benadryl [Diphenhydramine Hcl] Hives   Codeine Rash   Tetracycline Itching and Rash    Medications Reviewed Today     Reviewed by Melissa Montane, RN (Registered Nurse) on 04/09/22 at 1208  Med List Status: <None>   Medication Order Taking? Sig Documenting Provider Last Dose Status Informant  aspirin EC 81 MG tablet [id number] No Take 1 tablet (81 mg total) by mouth daily with breakfast. For Stroke Prevention Roxan Hockey, MD Taking Active Self  atorvastatin (LIPITOR) 40 MG tablet [id number]  Take 1 tablet (40 mg total) by mouth daily. Baruch Gouty, [name]  Active   baclofen (LIORESAL) 10 MG tablet [id number] No Take 1 tablet (10 mg total) by mouth 2 (two) times daily. Roxan Hockey, MD Taking Active Self  famotidine (PEPCID) 20 MG tablet [id number] No One after supper  Patient taking differently: Take 20 mg by mouth daily. One after supper   [name], MD Taking Active Self  gabapentin (NEURONTIN) 100 MG capsule [id number] No Take 1 capsule (100 mg total) by mouth 4 (four) times daily. [name], MD Taking Active Self  pantoprazole (PROTONIX) 40 MG tablet [id number] No Take 1 tablet (40 mg total) by mouth daily. Baruch Gouty, [name] Taking Active   polyethylene glycol powder (GLYCOLAX/MIRALAX) 17 GM/SCOOP powder [id number] No Take 17 g by mouth daily. Baruch Gouty,  [name] Taking Active   SUBOXONE 8-2 MG FILM [id number] No Place 0.5 Film under the tongue every 6 (six) hours. 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 01/15/2022   GERD (gastroesophageal reflux disease) 07/24/2021   Depression, major, single episode, moderate ([place]) 10/22/2020   GAD (generalized anxiety disorder) 11/01/2018   Vitamin D deficiency 11/01/2018   H/o Prior ischemic stroke ([place]) 09/03/2016   History of stroke 05/09/2016   History of substance abuse ([place]) 05/09/2016   Heart murmur 01/08/2016   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   Dyslipidemia 10/04/2015   HTN (hypertension) 10/04/2015   Discitis 10/04/2015   Severe recurrent major depression without psychotic features ([place]) 05/08/2015   Postlaminectomy syndrome, lumbar region 12/04/2012   Anemia 06/17/2012   Chronic low back pain 06/17/2012   DDD (degenerative disc disease), lumbosacral 06/17/2012   Sacroiliitis ([place]) 06/17/2012   Cigarette smoker 12/05/2009    Conditions to be addressed/monitored per PCP order:   Stroke history  Care Plan : RN Care Manager Plan of Care  Updates made by Melissa Montane, RN since 04/09/2022 12:00 AM     Problem: Health Management needs related to Stroke History      Long-Range Goal: Development of Plan of Care to address Health Management needs related to Stroke History   Start Date: 03/10/2022  Expected End Date: 07/08/2022  Note:   Current Barriers:  Chronic Disease Management support and education needs related to Stroke History  RNCM Clinical Goal(s):  Patient will verbalize understanding of plan for management of Stroke History as evidenced by patient reports take all medications exactly as prescribed and will call provider for medication related questions as evidenced by patient reports and EMR documentation    attend all scheduled medical appointments: 04/14/22 with PCP, 04/15/22 with BSW, PT on 04/15/22 and 04/22/22 and Pulmonology on 04/28/22 as evidenced by provider documentation        work with Education officer, museum to address Financial constraints related to  utilities, food and housing resources related to the management of Stroke History as evidenced by review of EMR and patient or Education officer, museum report     through collaboration with Consulting civil engineer, provider, and care team   Interventions: Inter-disciplinary care team collaboration (see longitudinal plan of care) Evaluation of current treatment plan related to  self management and patient's adherence to plan as established by provider Discussed patient has located new housing and will move in on 04/26/22  Stroke:  (Status:Goal on track:  Yes.) Long Term Goal Reviewed Importance of taking all medications as prescribed Reviewed Importance of attending all scheduled provider appointments Advised to report any changes in symptoms or exercise tolerance Assessed social determinant of health barriers Reviewed referrals to outpatient therapy Reviewed the importance of exercise Advised patient to schedule with PT for continued care Collaborated with PCP to request needed DME, shower chair Provided encouragement for patient on her sobriety Reviewed Neurology provider notes and discussed with patient  Patient Goals/Self-Care Activities: Take medications as prescribed   Attend all scheduled provider appointments Call provider office for new concerns or questions  Work with the social worker to address care coordination needs and will continue to work with the clinical team to address health care and disease management related needs  Follow Up:  Patient agrees to Care Plan and Follow-up.  Plan: The Managed Medicaid care management team will reach out to the patient again over the next 60 days.  Date/time of next scheduled RN care management/care coordination outreach:  06/16/22 @ 1:15pm  Lurena Joiner RN, [facility] RN Care Coordinator [phone]

## 2022-04-10 DIAGNOSIS — I63339 Cerebral infarction due to thrombosis of unspecified posterior cerebral artery: Secondary | ICD-10-CM | POA: Diagnosis not present

## 2022-04-12 ENCOUNTER — Other Ambulatory Visit: Payer: Self-pay | Admitting: Family Medicine

## 2022-04-12 DIAGNOSIS — I63339 Cerebral infarction due to thrombosis of unspecified posterior cerebral artery: Secondary | ICD-10-CM | POA: Diagnosis not present

## 2022-04-12 DIAGNOSIS — L282 Other prurigo: Secondary | ICD-10-CM

## 2022-04-13 DIAGNOSIS — R32 Unspecified urinary incontinence: Secondary | ICD-10-CM | POA: Diagnosis not present

## 2022-04-13 DIAGNOSIS — J449 Chronic obstructive pulmonary disease, unspecified: Secondary | ICD-10-CM | POA: Diagnosis not present

## 2022-04-13 DIAGNOSIS — I63339 Cerebral infarction due to thrombosis of unspecified posterior cerebral artery: Secondary | ICD-10-CM | POA: Diagnosis not present

## 2022-04-14 ENCOUNTER — Ambulatory Visit: Payer: Medicaid Other | Admitting: Family Medicine

## 2022-04-14 ENCOUNTER — Encounter: Payer: Self-pay | Admitting: Family Medicine

## 2022-04-14 VITALS — BP 149/98 | HR 79 | Temp 97.3°F | Ht 73.0 in | Wt 193.8 lb

## 2022-04-14 DIAGNOSIS — I63339 Cerebral infarction due to thrombosis of unspecified posterior cerebral artery: Secondary | ICD-10-CM | POA: Diagnosis not present

## 2022-04-14 DIAGNOSIS — N390 Urinary tract infection, site not specified: Secondary | ICD-10-CM | POA: Diagnosis not present

## 2022-04-14 DIAGNOSIS — R300 Dysuria: Secondary | ICD-10-CM | POA: Diagnosis not present

## 2022-04-14 LAB — URINALYSIS, ROUTINE W REFLEX MICROSCOPIC
Bilirubin, UA: NEGATIVE
Glucose, UA: NEGATIVE
Ketones, UA: NEGATIVE
Nitrite, UA: NEGATIVE
Protein,UA: NEGATIVE
Specific Gravity, UA: <1.005 — ABNORMAL LOW (ref 1.005–1.030)
Urobilinogen, Ur: 0.2 mg/dL (ref 0.2–1.0)
pH, UA: 7 (ref 5.0–7.5)

## 2022-04-14 LAB — MICROSCOPIC EXAMINATION
Bacteria, UA: NONE SEEN
Epithelial Cells (non renal): NONE SEEN /hpf (ref 0–10)
RBC, Urine: NONE SEEN /hpf (ref 0–2)
Renal Epithel, UA: NONE SEEN /hpf
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
## 2022-04-15 ENCOUNTER — Encounter: Payer: Self-pay | Admitting: Physical Therapy

## 2022-04-15 ENCOUNTER — Other Ambulatory Visit: Payer: Medicaid Other

## 2022-04-15 ENCOUNTER — Ambulatory Visit: Payer: Medicaid Other | Attending: Neurology | Admitting: Physical Therapy

## 2022-04-15 DIAGNOSIS — R262 Difficulty in walking, not elsewhere classified: Secondary | ICD-10-CM | POA: Insufficient documentation

## 2022-04-15 DIAGNOSIS — I63339 Cerebral infarction due to thrombosis of unspecified posterior cerebral artery: Secondary | ICD-10-CM | POA: Diagnosis not present

## 2022-04-15 DIAGNOSIS — M6281 Muscle weakness (generalized): Secondary | ICD-10-CM | POA: Insufficient documentation

## 2022-04-15 NOTE — Patient Instructions (Signed)
Visit Information  Ms. Briggs was given information about Medicaid Managed Care team care coordination services as a part of their [place] Medicaid benefit. [name] verbally consented to engagement with the [facility] Managed Care team.   If you are experiencing a medical emergency, please call 911 or report to your local emergency department or urgent care.   If you have a non-emergency medical problem during routine business hours, please contact your provider's office and ask to speak with a nurse.   For questions related to your [facility], please call: [phone] or visit the homepage here: [email or web address]  If you would like to schedule transportation through your [facility], please call the following number at least 2 days in advance of your appointment: [phone]   Rides for urgent appointments can also be made after hours by calling Member Services.  Call the [place] at [phone], at any time, 24 hours a day, 7 days a week. If you are in danger or need immediate medical attention call 911.  If you would like help to quit smoking, call 1-800-QUIT-NOW 939 185 9487) OR Espaol: 1-855-Djelo-Ya QO:409462) o para ms informacin haga clic aqu or Text READY to 200-400 to register via text  Ms. Gwenlyn Found - following are the goals we discussed in your visit today:   Goals Addressed   None      The  Patient                                              has been provided with contact information for the Managed Medicaid care management team and has been advised to call with any health related questions or concerns.   Mickel Fuchs, Arita Miss, MHA [facility]  Managed Medicaid Social Worker [phone]   Following is a copy of your plan of care:  There are no care plans that you recently modified to display for this  patient.

## 2022-04-15 NOTE — Therapy (Addendum)
OUTPATIENT PHYSICAL THERAPY NEURO TREATMENT   Patient Name: Alice [name] MRN: [id number] DOB:10-26-1965, 57 y.o., female Today's Date: 04/15/2022   PCP: Sonny Masters, FNP REFERRING PROVIDER: Marvel Plan, MD   END OF SESSION:  PT End of Session - 04/15/22 1353     Visit Number 9    Number of Visits 10    Date for PT Re-Evaluation 04/09/22    PT Start Time 1351    PT Stop Time 1426    PT Time Calculation (min) 35 min    Activity Tolerance Patient tolerated treatment well    Behavior During Therapy [facility] for tasks assessed/performed            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   Qualifier: Diagnosis of  By: Zachary George     Chronic low back pain    Cocaine abuse    COPD (chronic obstructive pulmonary disease)    Heart murmur    HLD (hyperlipidemia)    Hypertension    Stroke    x4   Past Surgical History:  Procedure Laterality Date   ABDOMINAL HYSTERECTOMY     abd?   BACK SURGERY     hardware in back   cyst removed from ovary     FRACTURE SURGERY     LUMBAR SPINE   removal of BB in finger  2008   SALIVARY GLAND SURGERY     tumor reomoved from stomach     Patient Active Problem List   Diagnosis Date Noted   Constipation due to opioid therapy 03/12/2022   COPD  GOLD ? /  Group B still smoking 01/15/2022   GERD (gastroesophageal reflux disease) 07/24/2021   Depression, major, single episode, moderate 10/22/2020   GAD (generalized anxiety disorder) 11/01/2018   Vitamin D deficiency 11/01/2018   H/o Prior ischemic stroke (HCC) 09/03/2016   History of stroke 05/09/2016   History of substance abuse 05/09/2016   Heart murmur 01/08/2016   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   Dyslipidemia 10/04/2015   HTN (hypertension) 10/04/2015   Discitis 10/04/2015   Severe recurrent major depression without psychotic features 05/08/2015   Postlaminectomy syndrome, lumbar region 12/04/2012   Anemia 06/17/2012   Chronic low back  pain 06/17/2012   DDD (degenerative disc disease), lumbosacral 06/17/2012   Sacroiliitis 06/17/2012   Cigarette smoker 12/05/2009   ONSET DATE: 01/17/22  REFERRING DIAG: Stroke-like symptoms   THERAPY DIAG:  Difficulty in walking, not elsewhere classified  Muscle weakness (generalized)  Rationale for Evaluation and Treatment: Rehabilitation  SUBJECTIVE:  SUBJECTIVE STATEMENT: Reports that she still has skin irritation and bladder issues. No other new complaints. Pt accompanied by: self  PERTINENT HISTORY: Hypertension, history of a TIA, history of multiple strokes, COPD, hepatitis C, chronic low back pain, current smoker, depression, and anxiety  PAIN:  Are you having pain? Yes: NPRS scale: 8/10 Pain location: low back Pain description: stabbing Aggravating factors: bending over, cleaning, and daily activities Relieving factors: none  PRECAUTIONS: None  PATIENT GOALS: improved function with her daily activities  OBJECTIVE:   DIAGNOSTIC FINDINGS: 01/18/22 brain MRI IMPRESSION: 1. No acute intracranial abnormality.   2. Essentially stable noncontrast MRI appearance of the brain since July, although vertex Superficial Siderosis and other scattered chronic cerebral blood products are more apparent on SWI this time vs GRE before. Appearance is nonspecific but does not rise to the level of amyloid angiopathy. Small chronic left parietal lobe infarct again noted.   SENSATION: Patient reports numbness from her mid tibia into her right foot. Diminished sensation throughout RLE with no dermatomal pattern.    POSTURE: rounded shoulders, forward head, decreased lumbar lordosis, increased thoracic kyphosis, and flexed trunk   LOWER EXTREMITY MMT:    MMT Right Eval Left Eval  Hip flexion 3+/5  4-/5  Hip extension    Hip abduction    Hip adduction    Hip internal rotation    Hip external rotation    Knee flexion 4-/5 5/5  Knee extension 3+/5 4/5  Ankle dorsiflexion 3+/5 4-/5  Ankle plantarflexion    Ankle inversion    Ankle eversion    (Blank rows = not tested)  TRANSFERS: Assistive device utilized: Environmental consultant - 4 wheeled  Sit to stand: SBA and required upper extremity support from armrests Stand to sit: SBA and required upper extremity support from armrests for a slow and controlled movement  GAIT: Gait pattern: step through pattern, decreased step length- Right, decreased step length- Left, decreased stride length, decreased hip/knee flexion- Right, decreased hip/knee flexion- Left, Right foot flat, Left foot flat, knee flexed in stance- Right, knee flexed in stance- Left, trunk flexed, wide BOS, poor foot clearance- Right, and poor foot clearance- Left Assistive device utilized: Walker - 4 wheeled Level of assistance: Modified independence  FUNCTIONAL TESTS:  5 times sit to stand: 17 seconds; required upper extremity use for stand to sit and sit to stand transfers 03/19/22 Timed up and go (TUG): 16 seconds with rolling walker 03/19/22  TODAY'S TREATMENT:                                                                                                                              DATE:                        04/15/22   EXERCISE LOG  Exercise Repetitions and Resistance Comments  Bike  L5 x12 min   LAQ 4# x20 reps each  VC to improve seated posture  HS curl Green theraband x30 reps   Clam Green theraband x30 reps   B heel/toe raises X30 reps    Blank cell = exercise not performed today   PATIENT EDUCATION: Education details: Plan of care, prognosis, and goals for therapy Person educated: Patient Education method: Explanation Education comprehension: verbalized understanding  HOME EXERCISE PROGRAM:  GOALS: Goals reviewed with patient? Yes  SHORT TERM GOALS: Target date:  02/12/22  Patient will be independent with her initial HEP. Baseline: Goal status: On-going  2.  Patient will improve her 5 times sit to stand time to 23 seconds or less. Baseline:  Goal status: MET  3.  Patient will improve her timed up and go to 17 seconds or less. Baseline:  Goal status: MET  LONG TERM GOALS: Target date: 03/05/22  Patient will be independent with her advanced HEP. Baseline:  Goal status: On-going  2.  Patient will improve her 5 times sit to stand time to 15 seconds or less for improved lower extremity power. Baseline:  Goal status: MET 11.23 secs  3.  Patient will improve her timed up and go time to 13 seconds or less for improved safety. Baseline:  Goal status: On-going  ASSESSMENT:  CLINICAL IMPRESSION: Patient presented in clinic with reports of more pain today. Patient also battling other health issues currently. Patient able to tolerated seated exercises well but limited with standing or extensive exercises as she is trying to avoid sweating with her skin irritation. Patient did not use AD today as she did not use public transportation. Increased trunk flexion noted in standing without AD.  PHYSICAL THERAPY DISCHARGE SUMMARY  Visits from Start of Care: 9  Current functional level related to goals / functional outcomes: Patient was able to partially meet her short and long term goals for skilled physical therapy.    Remaining deficits: Gait speed and posture    Education / Equipment: HEP    Patient agrees to discharge. Patient goals were partially met. Patient is being discharged due to not returning since the last visit.  Candi Leash, PT, DPT    OBJECTIVE IMPAIRMENTS: Abnormal gait, decreased activity tolerance, decreased balance, decreased mobility, difficulty walking, decreased strength, hypomobility, impaired flexibility, impaired sensation, impaired tone, improper body mechanics, postural dysfunction, and pain.   ACTIVITY LIMITATIONS:  carrying, lifting, bending, standing, squatting, stairs, transfers, and locomotion level  PARTICIPATION LIMITATIONS: meal prep, cleaning, laundry, shopping, and community activity  PERSONAL FACTORS: Fitness, Past/current experiences, Social background, Transportation, and 3+ comorbidities: Hypertension, history of a TIA, history of multiple strokes, COPD, hepatitis C, chronic low back pain, current smoker, depression, and anxiety  are also affecting patient's functional outcome.   REHAB POTENTIAL: Fair    CLINICAL DECISION MAKING: Unstable/unpredictable  EVALUATION COMPLEXITY: High  PLAN:  PT FREQUENCY: 1-2x/week  PT DURATION: other: 5 weeks  PLANNED INTERVENTIONS: Therapeutic exercises, Therapeutic activity, Neuromuscular re-education, Balance training, Gait training, Patient/Family education, Self Care, Stair training, and Re-evaluation  PLAN FOR NEXT SESSION: NuStep, lower extremity strengthening, postural reeducation, and balance interventions  Marvell Fuller, PTA 04/15/2022, 2:45 PM

## 2022-04-15 NOTE — Patient Outreach (Signed)
Medicaid Managed Care Social Work Note  04/15/2022 Name:  Alice [name] MRN:  [id number] DOB:  [birth year]  Alice [name] is an 57 y.o. year old female who is a primary patient of Rakes, Connye Burkitt, FNP.  The Medicaid Managed Care Coordination team was consulted for assistance with:  Community Resources   [name] was given information about Medicaid Managed Care Coordination team services today. Alice [name] Patient agreed to services and verbal consent obtained.  Engaged with patient  for by telephone forfollow up visit in response to referral for case management and/or care coordination services.   Assessments/Interventions:  Review of past medical history, allergies, medications, health status, including review of consultants reports, laboratory and other test data, was performed as part of comprehensive evaluation and provision of chronic care management services.  SDOH: (Social Determinant of Health) assessments and interventions performed: SDOH Interventions    Flowsheet Row Office Visit from 03/12/2022 in [place] Most recent reading at 03/12/2022 11:09 AM Patient Outreach Telephone from 03/10/2022 in [place] Most recent reading at 03/10/2022  2:42 PM Patient Outreach Telephone from 02/26/2022 in [place] Most recent reading at 02/26/2022  2:48 PM Telephone from 01/29/2022 in [place] Most recent reading at 01/29/2022  1:11 PM Office Visit from 01/29/2022 in [place] Most recent reading at 01/29/2022  9:31 AM Office Visit from 08/14/2021 in [place] Most recent reading at 08/14/2021 12:16 PM  SDOH Interventions        Food Insecurity Interventions -- -- Other (Comment)  [Collaborate with BSW] Intervention Not Indicated -- --  Housing Interventions -- Other (Comment)  [BSW referral for  housing resources] -- -- -- --  Transportation Interventions -- -- Payor Benefit Intervention Not Indicated -- --  Utilities Interventions -- -- Intervention Not Indicated -- -- --  Depression Interventions/Treatment  Currently on Treatment -- -- -- Currently on Treatment Patient refuses Treatment     BSW completed a telephone outreach with patient, she stated everything is good, they were able to find a new home and move on 4/15. Patient states no other resources are needed at this time, and no further follow up is needed. BSW provided patient with telephone number for any questions.  Advanced Directives Status:  Not addressed in this encounter.  Care Plan                 Allergies  Allergen Reactions   Benadryl [Diphenhydramine Hcl] Hives   Codeine Rash   Tetracycline Itching and Rash    Medications Reviewed Today     Reviewed by Alice Gouty, FNP (Family Nurse Practitioner) on 04/14/22 at 3  Med List Status: <None>   Medication Order Taking? Sig Documenting Provider Last Dose Status Informant  aspirin EC 81 MG tablet [id number] Yes Take 1 tablet (81 mg total) by mouth daily with breakfast. For Stroke Prevention Alice Hockey, MD Taking Active Self  atorvastatin (LIPITOR) 40 MG tablet [id number] Yes Take 1 tablet (40 mg total) by mouth daily. Alice Gouty, FNP Taking Active   baclofen (LIORESAL) 10 MG tablet [id number] Yes Take 1 tablet (10 mg total) by mouth 2 (two) times daily. Alice Hockey, MD Taking Active Self  famotidine (PEPCID) 20 MG tablet [id number] Yes One after supper  Patient taking differently: Take 20 mg by mouth daily. One after supper   Alice Rockers, MD Taking Active  Self  gabapentin (NEURONTIN) 100 MG capsule [id number] Yes Take 1 capsule (100 mg total) by mouth 4 (four) times daily. Alice Rockers, MD Taking Active Self  pantoprazole (PROTONIX) 40 MG tablet [id number] Yes Take 1 tablet (40 mg total) by mouth daily. Alice Gouty, FNP Taking Active    polyethylene glycol powder (GLYCOLAX/MIRALAX) 17 GM/SCOOP powder [id number] Yes Take 17 g by mouth daily. Alice Gouty, FNP Taking Active   SUBOXONE 8-2 MG FILM [id number] Yes Place 0.5 Film under the tongue every 6 (six) hours. [provider] Taking Active Self            Patient Active Problem List   Diagnosis Date Noted   Constipation due to opioid therapy 03/12/2022   COPD  GOLD ? /  Group B still smoking 01/15/2022   GERD (gastroesophageal reflux disease) 07/24/2021   Depression, major, single episode, moderate 10/22/2020   GAD (generalized anxiety disorder) 11/01/2018   Vitamin D deficiency 11/01/2018   H/o Prior ischemic stroke ([place]) 09/03/2016   History of stroke 05/09/2016   History of substance abuse 05/09/2016   Heart murmur 01/08/2016   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   Dyslipidemia 10/04/2015   HTN (hypertension) 10/04/2015   Discitis 10/04/2015   Severe recurrent major depression without psychotic features 05/08/2015   Postlaminectomy syndrome, lumbar region 12/04/2012   Anemia 06/17/2012   Chronic low back pain 06/17/2012   DDD (degenerative disc disease), lumbosacral 06/17/2012   Sacroiliitis 06/17/2012   Cigarette smoker 12/05/2009    Conditions to be addressed/monitored per PCP order:   housing  There are no care plans that you recently modified to display for this patient.   Follow up:  Patient requests no follow-up at this time.  Plan: The  Patient has been provided with contact information for the Managed Medicaid care management team and has been advised to call with any health related questions or concerns.    Alice [name], [facility] Social Worker [phone]

## 2022-04-16 DIAGNOSIS — I63339 Cerebral infarction due to thrombosis of unspecified posterior cerebral artery: Secondary | ICD-10-CM | POA: Diagnosis not present

## 2022-04-17 ENCOUNTER — Encounter: Payer: Self-pay | Admitting: Family Medicine

## 2022-04-17 DIAGNOSIS — I63339 Cerebral infarction due to thrombosis of unspecified posterior cerebral artery: Secondary | ICD-10-CM | POA: Diagnosis not present

## 2022-04-17 LAB — URINE CULTURE

## 2022-04-17 MED ORDER — AMOXICILLIN-POT CLAVULANATE 875-125 MG PO TABS: 1.0000 | ORAL_TABLET | Freq: Two times a day (BID) | ORAL | 0 refills | Status: DC

## 2022-04-18 DIAGNOSIS — I63339 Cerebral infarction due to thrombosis of unspecified posterior cerebral artery: Secondary | ICD-10-CM | POA: Diagnosis not present

## 2022-04-19 DIAGNOSIS — I63339 Cerebral infarction due to thrombosis of unspecified posterior cerebral artery: Secondary | ICD-10-CM | POA: Diagnosis not present

## 2022-04-20 DIAGNOSIS — I63339 Cerebral infarction due to thrombosis of unspecified posterior cerebral artery: Secondary | ICD-10-CM | POA: Diagnosis not present

## 2022-04-21 DIAGNOSIS — I63339 Cerebral infarction due to thrombosis of unspecified posterior cerebral artery: Secondary | ICD-10-CM | POA: Diagnosis not present

## 2022-04-22 ENCOUNTER — Ambulatory Visit (INDEPENDENT_AMBULATORY_CARE_PROVIDER_SITE_OTHER): Payer: Medicaid Other | Admitting: Urology

## 2022-04-22 ENCOUNTER — Encounter: Payer: Self-pay | Admitting: Urology

## 2022-04-22 ENCOUNTER — Telehealth: Payer: Medicaid Other | Admitting: Family Medicine

## 2022-04-22 VITALS — BP 137/88 | HR 62 | Ht 72.0 in | Wt 197.0 lb

## 2022-04-22 DIAGNOSIS — R350 Frequency of micturition: Secondary | ICD-10-CM

## 2022-04-22 DIAGNOSIS — N3941 Urge incontinence: Secondary | ICD-10-CM

## 2022-04-22 DIAGNOSIS — N390 Urinary tract infection, site not specified: Secondary | ICD-10-CM

## 2022-04-22 DIAGNOSIS — R3915 Urgency of urination: Secondary | ICD-10-CM

## 2022-04-22 DIAGNOSIS — I63339 Cerebral infarction due to thrombosis of unspecified posterior cerebral artery: Secondary | ICD-10-CM | POA: Diagnosis not present

## 2022-04-22 LAB — URINALYSIS, ROUTINE W REFLEX MICROSCOPIC
Bilirubin, UA: NEGATIVE
Glucose, UA: NEGATIVE
Ketones, UA: NEGATIVE
Leukocytes,UA: NEGATIVE
Nitrite, UA: NEGATIVE
Protein,UA: NEGATIVE
RBC, UA: NEGATIVE
Specific Gravity, UA: 1.015 (ref 1.005–1.030)
Urobilinogen, Ur: 1 mg/dL (ref 0.2–1.0)
pH, UA: 6 (ref 5.0–7.5)

## 2022-04-22 LAB — BLADDER SCAN AMB NON-IMAGING

## 2022-04-22 NOTE — Progress Notes (Signed)
Assessment: 1. Frequent UTI   2. Urinary frequency   3. Urgency of urination   4. Urge incontinence     Plan: I personally reviewed the patient's chart including provider notes, lab results. Complete Augmentin as prescribed for recent UTI. Bladder diet sheet provided. Methods to reduce the risk of UTIs including increase fluid intake, timed and double voiding, daily cranberry supplement, and daily probiotics discussed. Return to office in 2 weeks for reevaluation off of antibiotics. Will discuss treatment for OAB wet at her next visit.  Chief Complaint:  Chief Complaint  Patient presents with   Dysuria    History of Present Illness:  Alice [name] is a 57 y.o. female who is seen in consultation from Sonny Masters, FNP for evaluation of frequent UTI's and voiding symptoms. She reports onset of her symptoms approximately 2 months ago.  She has baseline symptoms of frequency, urgency, and urge incontinence.  She does wear incontinence pads on a daily basis.  She has had increased frequency, urgency, and bladder discomfort.  She was diagnosed with a UTI in March 2024.  Urine culture grew 25-50 K E. coli.  She was treated with antibiotics.  A repeat urine culture from 04/14/2022 again grew 25-50 K E. coli.  She is currently on Augmentin x 10 days for treatment.  She has approximately 6 days left of the antibiotic.  She has noted some improvement in her symptoms.  No prior history of UTIs.  No gross hematuria or flank pain.    Past Medical History:  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   Qualifier: Diagnosis of  By: Zachary George     Chronic low back pain    Cocaine abuse    COPD (chronic obstructive pulmonary disease)    Heart murmur    HLD (hyperlipidemia)    Hypertension    Stroke    x4    Past Surgical History:  Past Surgical History:  Procedure Laterality Date   ABDOMINAL HYSTERECTOMY     abd?   BACK SURGERY     hardware in back   cyst removed  from ovary     FRACTURE SURGERY     LUMBAR SPINE   removal of BB in finger  2008   SALIVARY GLAND SURGERY     tumor reomoved from stomach      Allergies:  Allergies  Allergen Reactions   Benadryl [Diphenhydramine Hcl] Hives   Codeine Rash   Tetracycline Itching and Rash    Family History:  Family History  Problem Relation Age of Onset   Hypertension Mother    Cancer Father    CVA Neg Hx     Social History:  Social History   Tobacco Use   Smoking status: Every Day    Packs/day: 0.50    Years: 40.00    Additional pack years: 0.00    Total pack years: 20.00    Types: Cigarettes   Smokeless tobacco: Never  Vaping Use   Vaping Use: Never used  Substance Use Topics   Alcohol use: Not Currently    Comment: today   Drug use: Not Currently    Types: Heroin, "Crack" cocaine, Oxycodone, Cocaine    Comment: last used 05/24/20    Review of symptoms:  Constitutional:  Negative for unexplained weight loss, night sweats, fever, chills ENT:  Negative for nose bleeds, sinus pain, painful swallowing CV:  Negative for chest pain, shortness of breath, exercise intolerance, palpitations, loss of consciousness  Resp:  Negative for cough, wheezing, shortness of breath GI:  Negative for nausea, vomiting, diarrhea, bloody stools GU:  Positives noted in HPI; otherwise negative for gross hematuria Neuro:  Negative for seizures, poor balance, limb weakness, slurred speech Psych:  Negative for lack of energy, depression, anxiety Endocrine:  Negative for polydipsia, polyuria, symptoms of hypoglycemia (dizziness, hunger, sweating) Hematologic:  Negative for anemia, purpura, petechia, prolonged or excessive bleeding, use of anticoagulants  Allergic:  Negative for difficulty breathing or choking as a result of exposure to anything; no shellfish allergy; no allergic response (rash/itch) to materials, foods  Physical exam: BP 137/88   Pulse 62   Ht 6' (1.829 m)   Wt 197 lb (89.4 kg)   LMP  07/30/2011   BMI 26.72 kg/m  GENERAL APPEARANCE:  Well appearing, well developed, well nourished, NAD HEENT: Atraumatic, Normocephalic, oropharynx clear. NECK: Supple without lymphadenopathy or thyromegaly. LUNGS: Clear to auscultation bilaterally. HEART: Regular Rate and Rhythm without murmurs, gallops, or rubs. ABDOMEN: Soft, non-tender, No Masses. EXTREMITIES: Moves all extremities well.  Without clubbing, cyanosis, or edema. NEUROLOGIC:  Alert and oriented x 3, uses a walker, CN II-XII grossly intact.  MENTAL STATUS:  Appropriate. BACK:  Non-tender to palpation.  No CVAT SKIN:  Warm, dry and intact.    Results: U/A:  negative  PVR = 3 ml

## 2022-04-23 DIAGNOSIS — I63339 Cerebral infarction due to thrombosis of unspecified posterior cerebral artery: Secondary | ICD-10-CM | POA: Diagnosis not present

## 2022-04-24 DIAGNOSIS — I63339 Cerebral infarction due to thrombosis of unspecified posterior cerebral artery: Secondary | ICD-10-CM | POA: Diagnosis not present

## 2022-04-25 DIAGNOSIS — I63339 Cerebral infarction due to thrombosis of unspecified posterior cerebral artery: Secondary | ICD-10-CM | POA: Diagnosis not present

## 2022-04-26 ENCOUNTER — Other Ambulatory Visit: Payer: Self-pay

## 2022-04-26 DIAGNOSIS — I63339 Cerebral infarction due to thrombosis of unspecified posterior cerebral artery: Secondary | ICD-10-CM | POA: Diagnosis not present

## 2022-04-26 NOTE — Patient Outreach (Signed)
First telephone outreach attempt to obtain mRS. Patient is busy asked to be called back at a later date.   Vanice Sarah [facility] Management Assistant [phone]

## 2022-04-27 DIAGNOSIS — I63339 Cerebral infarction due to thrombosis of unspecified posterior cerebral artery: Secondary | ICD-10-CM | POA: Diagnosis not present

## 2022-04-28 ENCOUNTER — Ambulatory Visit: Payer: Medicaid Other | Admitting: Internal Medicine

## 2022-04-28 ENCOUNTER — Other Ambulatory Visit: Payer: Self-pay

## 2022-04-28 DIAGNOSIS — I63339 Cerebral infarction due to thrombosis of unspecified posterior cerebral artery: Secondary | ICD-10-CM | POA: Diagnosis not present

## 2022-04-28 NOTE — Patient Outreach (Signed)
Second telephone outreach attempt to obtain mRS. No answer. Left message for returned call.  Alice [name] [facility] Management Assistant [phone]  

## 2022-04-29 DIAGNOSIS — I63339 Cerebral infarction due to thrombosis of unspecified posterior cerebral artery: Secondary | ICD-10-CM | POA: Diagnosis not present

## 2022-04-30 ENCOUNTER — Other Ambulatory Visit: Payer: Self-pay

## 2022-04-30 DIAGNOSIS — I63339 Cerebral infarction due to thrombosis of unspecified posterior cerebral artery: Secondary | ICD-10-CM | POA: Diagnosis not present

## 2022-04-30 NOTE — Patient Outreach (Signed)
3 outreach attempts were completed to obtain mRs. Patient was in a lot of pain on 3rd call and asked that I call back another date mRs could not be obtained because will be past patient's 90 day deadline. mRs=7    Vanice Sarah Care Management Assistant [phone]

## 2022-05-01 DIAGNOSIS — I63339 Cerebral infarction due to thrombosis of unspecified posterior cerebral artery: Secondary | ICD-10-CM | POA: Diagnosis not present

## 2022-05-02 DIAGNOSIS — I63339 Cerebral infarction due to thrombosis of unspecified posterior cerebral artery: Secondary | ICD-10-CM | POA: Diagnosis not present

## 2022-05-03 DIAGNOSIS — I63339 Cerebral infarction due to thrombosis of unspecified posterior cerebral artery: Secondary | ICD-10-CM | POA: Diagnosis not present

## 2022-05-04 DIAGNOSIS — I63339 Cerebral infarction due to thrombosis of unspecified posterior cerebral artery: Secondary | ICD-10-CM | POA: Diagnosis not present

## 2022-05-05 DIAGNOSIS — I63339 Cerebral infarction due to thrombosis of unspecified posterior cerebral artery: Secondary | ICD-10-CM | POA: Diagnosis not present

## 2022-05-06 DIAGNOSIS — I63339 Cerebral infarction due to thrombosis of unspecified posterior cerebral artery: Secondary | ICD-10-CM | POA: Diagnosis not present

## 2022-05-06 DIAGNOSIS — L304 Erythema intertrigo: Secondary | ICD-10-CM | POA: Diagnosis not present

## 2022-05-07 DIAGNOSIS — I63339 Cerebral infarction due to thrombosis of unspecified posterior cerebral artery: Secondary | ICD-10-CM | POA: Diagnosis not present

## 2022-05-08 DIAGNOSIS — I63339 Cerebral infarction due to thrombosis of unspecified posterior cerebral artery: Secondary | ICD-10-CM | POA: Diagnosis not present

## 2022-05-09 DIAGNOSIS — I63339 Cerebral infarction due to thrombosis of unspecified posterior cerebral artery: Secondary | ICD-10-CM | POA: Diagnosis not present

## 2022-05-10 DIAGNOSIS — I63339 Cerebral infarction due to thrombosis of unspecified posterior cerebral artery: Secondary | ICD-10-CM | POA: Diagnosis not present

## 2022-05-11 DIAGNOSIS — I63339 Cerebral infarction due to thrombosis of unspecified posterior cerebral artery: Secondary | ICD-10-CM | POA: Diagnosis not present

## 2022-05-12 DIAGNOSIS — I63339 Cerebral infarction due to thrombosis of unspecified posterior cerebral artery: Secondary | ICD-10-CM | POA: Diagnosis not present

## 2022-05-13 DIAGNOSIS — I63339 Cerebral infarction due to thrombosis of unspecified posterior cerebral artery: Secondary | ICD-10-CM | POA: Diagnosis not present

## 2022-05-14 DIAGNOSIS — I63339 Cerebral infarction due to thrombosis of unspecified posterior cerebral artery: Secondary | ICD-10-CM | POA: Diagnosis not present

## 2022-05-15 DIAGNOSIS — I63339 Cerebral infarction due to thrombosis of unspecified posterior cerebral artery: Secondary | ICD-10-CM | POA: Diagnosis not present

## 2022-05-17 DIAGNOSIS — I63339 Cerebral infarction due to thrombosis of unspecified posterior cerebral artery: Secondary | ICD-10-CM | POA: Diagnosis not present

## 2022-05-18 DIAGNOSIS — I63339 Cerebral infarction due to thrombosis of unspecified posterior cerebral artery: Secondary | ICD-10-CM | POA: Diagnosis not present

## 2022-05-19 DIAGNOSIS — I63339 Cerebral infarction due to thrombosis of unspecified posterior cerebral artery: Secondary | ICD-10-CM | POA: Diagnosis not present

## 2022-05-20 ENCOUNTER — Encounter: Payer: Self-pay | Admitting: Urology

## 2022-05-20 ENCOUNTER — Ambulatory Visit (INDEPENDENT_AMBULATORY_CARE_PROVIDER_SITE_OTHER): Payer: Medicaid Other | Admitting: Urology

## 2022-05-20 VITALS — BP 138/90 | HR 73 | Wt 195.7 lb

## 2022-05-20 DIAGNOSIS — R8290 Unspecified abnormal findings in urine: Secondary | ICD-10-CM

## 2022-05-20 DIAGNOSIS — R350 Frequency of micturition: Secondary | ICD-10-CM

## 2022-05-20 DIAGNOSIS — I63339 Cerebral infarction due to thrombosis of unspecified posterior cerebral artery: Secondary | ICD-10-CM | POA: Diagnosis not present

## 2022-05-20 DIAGNOSIS — Z87440 Personal history of urinary (tract) infections: Secondary | ICD-10-CM | POA: Diagnosis not present

## 2022-05-20 DIAGNOSIS — N3941 Urge incontinence: Secondary | ICD-10-CM

## 2022-05-20 DIAGNOSIS — R3915 Urgency of urination: Secondary | ICD-10-CM

## 2022-05-20 DIAGNOSIS — N390 Urinary tract infection, site not specified: Secondary | ICD-10-CM

## 2022-05-20 MED ORDER — MIRABEGRON ER 50 MG PO TB24: 50.0000 mg | ORAL_TABLET | Freq: Every day | ORAL | 0 refills | Status: DC

## 2022-05-20 NOTE — Progress Notes (Signed)
Assessment: 1. Frequent UTI   2. Urinary frequency   3. Urgency of urination   4. Urge incontinence   5. Abnormal urine findings     Plan: Continue methods to reduce the risk of UTIs including increase fluid intake, timed and double voiding, daily cranberry supplement, and daily probiotics discussed. Resolve Mdx culture sent today - will call with results Trial of Myrbetriq 50 mg daily.  Samples given. Return to office in 1 month  Chief Complaint:  Chief Complaint  Patient presents with   Urinary Tract Infection    History of Present Illness:  Alice [name] is a 57 y.o. female who is seen for further evaluation of frequent UTI's and voiding symptoms. At her visit in 4/24, she reported onset of her symptoms approximately 2 months prior.  She has baseline symptoms of frequency, urgency, and urge incontinence.  She uses incontinence pads on a daily basis.  She has had increased frequency, urgency, and bladder discomfort.  She was diagnosed with a UTI in March 2024.  Urine culture grew 25-50 K E. coli.  She was treated with antibiotics.  A repeat urine culture from 04/14/2022 again grew 25-50 K E. coli.  She was on Augmentin x 10 days for treatment.  She noted some improvement in her symptoms.  No prior history of UTIs.  No gross hematuria or flank pain.  She returns today for follow-up.  She continues to have symptoms of frequency, urgency, intermittent stream, and urge incontinence.  She notices increased symptoms when she does not drink water and cranberry juice.  No dysuria or gross hematuria.  She continues to require pads for her incontinence.  Portions of the above documentation were copied from a prior visit for review purposes only.   Past Medical History:  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   Qualifier: Diagnosis of  By: Zachary George     Chronic low back pain    Cocaine abuse (HCC)    COPD (chronic obstructive pulmonary disease) (HCC)    Heart  murmur    HLD (hyperlipidemia)    Hypertension    Stroke (HCC)    x4    Past Surgical History:  Past Surgical History:  Procedure Laterality Date   ABDOMINAL HYSTERECTOMY     abd?   BACK SURGERY     hardware in back   cyst removed from ovary     FRACTURE SURGERY     LUMBAR SPINE   removal of BB in finger  2008   SALIVARY GLAND SURGERY     tumor reomoved from stomach      Allergies:  Allergies  Allergen Reactions   Benadryl [Diphenhydramine Hcl] Hives   Codeine Rash   Tetracycline Itching and Rash    Family History:  Family History  Problem Relation Age of Onset   Hypertension Mother    Cancer Father    CVA Neg Hx     Social History:  Social History   Tobacco Use   Smoking status: Every Day    Packs/day: 0.50    Years: 40.00    Additional pack years: 0.00    Total pack years: 20.00    Types: Cigarettes   Smokeless tobacco: Never  Vaping Use   Vaping Use: Never used  Substance Use Topics   Alcohol use: Not Currently    Comment: today   Drug use: Not Currently    Types: Heroin, "Crack" cocaine, Oxycodone, Cocaine    Comment: last used  05/24/20    ROS: Constitutional:  Negative for fever, chills, weight loss CV: Negative for chest pain, previous MI, hypertension Respiratory:  Negative for shortness of breath, wheezing, sleep apnea, frequent cough GI:  Negative for nausea, vomiting, bloody stool, GERD  Physical exam: BP (!) 138/90   Pulse 73   Wt 195 lb 11.2 oz (88.8 kg)   LMP 07/30/2011   BMI 26.54 kg/m  GENERAL APPEARANCE:  Well appearing, well developed, well nourished, NAD HEENT:  Atraumatic, normocephalic, oropharynx clear NECK:  Supple without lymphadenopathy or thyromegaly ABDOMEN:  Soft, non-tender, no masses EXTREMITIES:  Moves all extremities well, without clubbing, cyanosis, or edema NEUROLOGIC:  Alert and oriented x 3, normal gait, CN II-XII grossly intact MENTAL STATUS:  appropriate BACK:  Non-tender to palpation, No CVAT SKIN:   Warm, dry, and intact  Results: U/A: 0-5 WBCs, 0 RBCs, moderate bacteria, nitrite negative

## 2022-05-21 DIAGNOSIS — I63339 Cerebral infarction due to thrombosis of unspecified posterior cerebral artery: Secondary | ICD-10-CM | POA: Diagnosis not present

## 2022-05-21 LAB — URINALYSIS, ROUTINE W REFLEX MICROSCOPIC
Bilirubin, UA: NEGATIVE
Glucose, UA: NEGATIVE
Ketones, UA: NEGATIVE
Nitrite, UA: NEGATIVE
Protein,UA: NEGATIVE
RBC, UA: NEGATIVE
Specific Gravity, UA: 1.01 (ref 1.005–1.030)
Urobilinogen, Ur: 0.2 mg/dL (ref 0.2–1.0)
pH, UA: 7 (ref 5.0–7.5)

## 2022-05-21 LAB — MICROSCOPIC EXAMINATION
Cast Type: NONE SEEN
Casts: NONE SEEN /lpf
Crystal Type: NONE SEEN
Crystals: NONE SEEN
Mucus, UA: NONE SEEN
RBC, Urine: NONE SEEN /hpf (ref 0–2)
Trichomonas, UA: NONE SEEN
Yeast, UA: NONE SEEN

## 2022-05-22 DIAGNOSIS — I63339 Cerebral infarction due to thrombosis of unspecified posterior cerebral artery: Secondary | ICD-10-CM | POA: Diagnosis not present

## 2022-05-24 ENCOUNTER — Other Ambulatory Visit: Payer: Self-pay | Admitting: Urology

## 2022-05-24 ENCOUNTER — Encounter: Payer: Self-pay | Admitting: Urology

## 2022-05-24 DIAGNOSIS — J449 Chronic obstructive pulmonary disease, unspecified: Secondary | ICD-10-CM | POA: Diagnosis not present

## 2022-05-24 DIAGNOSIS — R32 Unspecified urinary incontinence: Secondary | ICD-10-CM | POA: Diagnosis not present

## 2022-05-24 DIAGNOSIS — N390 Urinary tract infection, site not specified: Secondary | ICD-10-CM

## 2022-05-24 DIAGNOSIS — I639 Cerebral infarction, unspecified: Secondary | ICD-10-CM | POA: Diagnosis not present

## 2022-05-24 MED ORDER — NITROFURANTOIN MONOHYD MACRO 100 MG PO CAPS: ORAL_CAPSULE | ORAL | 0 refills | Status: DC

## 2022-05-24 NOTE — Telephone Encounter (Signed)
Patient responded to via telephone. Advised pt Dr out of office this week, Dr Margo Aye will take a look at results tomorrow and someone will be in touch with those results. Patient expressed understanding.

## 2022-05-25 ENCOUNTER — Telehealth: Payer: Self-pay | Admitting: Urology

## 2022-05-25 ENCOUNTER — Other Ambulatory Visit: Payer: Self-pay | Admitting: Urology

## 2022-05-25 DIAGNOSIS — I639 Cerebral infarction, unspecified: Secondary | ICD-10-CM | POA: Diagnosis not present

## 2022-05-25 NOTE — Telephone Encounter (Signed)
-----   Message from Milderd Meager, MD sent at 05/24/2022  5:53 PM EDT ----- Please schedule patient for CT renal stone study.  Order entered. She will need appt for cystoscopy in about 3 weeks (after CT done).

## 2022-05-25 NOTE — Telephone Encounter (Signed)
CT scan approved  Alice [name]

## 2022-05-26 DIAGNOSIS — I639 Cerebral infarction, unspecified: Secondary | ICD-10-CM | POA: Diagnosis not present

## 2022-05-27 DIAGNOSIS — I639 Cerebral infarction, unspecified: Secondary | ICD-10-CM | POA: Diagnosis not present

## 2022-05-28 DIAGNOSIS — I639 Cerebral infarction, unspecified: Secondary | ICD-10-CM | POA: Diagnosis not present

## 2022-05-29 DIAGNOSIS — I639 Cerebral infarction, unspecified: Secondary | ICD-10-CM | POA: Diagnosis not present

## 2022-05-31 DIAGNOSIS — I639 Cerebral infarction, unspecified: Secondary | ICD-10-CM | POA: Diagnosis not present

## 2022-06-01 DIAGNOSIS — I639 Cerebral infarction, unspecified: Secondary | ICD-10-CM | POA: Diagnosis not present

## 2022-06-02 ENCOUNTER — Telehealth: Payer: Self-pay | Admitting: Family Medicine

## 2022-06-02 DIAGNOSIS — I639 Cerebral infarction, unspecified: Secondary | ICD-10-CM | POA: Diagnosis not present

## 2022-06-02 NOTE — Telephone Encounter (Signed)
Pt needs her CAP paperwork faxed to [phone] before 5/29 so that her acct isnt closed.

## 2022-06-03 ENCOUNTER — Ambulatory Visit (HOSPITAL_BASED_OUTPATIENT_CLINIC_OR_DEPARTMENT_OTHER)
Admission: RE | Admit: 2022-06-03 | Discharge: 2022-06-03 | Disposition: A | Discharge status: Home / Self Care | Payer: Medicaid Other | Source: Ambulatory Visit | Attending: Urology | Admitting: Urology

## 2022-06-03 DIAGNOSIS — K8020 Calculus of gallbladder without cholecystitis without obstruction: Secondary | ICD-10-CM | POA: Diagnosis not present

## 2022-06-03 DIAGNOSIS — K838 Other specified diseases of biliary tract: Secondary | ICD-10-CM | POA: Diagnosis not present

## 2022-06-03 DIAGNOSIS — N390 Urinary tract infection, site not specified: Secondary | ICD-10-CM | POA: Insufficient documentation

## 2022-06-03 DIAGNOSIS — I639 Cerebral infarction, unspecified: Secondary | ICD-10-CM | POA: Diagnosis not present

## 2022-06-03 DIAGNOSIS — K5730 Diverticulosis of large intestine without perforation or abscess without bleeding: Secondary | ICD-10-CM | POA: Diagnosis not present

## 2022-06-04 DIAGNOSIS — I639 Cerebral infarction, unspecified: Secondary | ICD-10-CM | POA: Diagnosis not present

## 2022-06-04 NOTE — Telephone Encounter (Signed)
Patient calling to check on paperwork, said she would like a call back to know the status.

## 2022-06-04 NOTE — Telephone Encounter (Signed)
Per Angelica Chessman paper work was faxed on 06/03/22. LMTCB

## 2022-06-05 DIAGNOSIS — I639 Cerebral infarction, unspecified: Secondary | ICD-10-CM | POA: Diagnosis not present

## 2022-06-07 DIAGNOSIS — I639 Cerebral infarction, unspecified: Secondary | ICD-10-CM | POA: Diagnosis not present

## 2022-06-08 DIAGNOSIS — I639 Cerebral infarction, unspecified: Secondary | ICD-10-CM | POA: Diagnosis not present

## 2022-06-09 ENCOUNTER — Encounter: Payer: Self-pay | Admitting: Urology

## 2022-06-09 DIAGNOSIS — I639 Cerebral infarction, unspecified: Secondary | ICD-10-CM | POA: Diagnosis not present

## 2022-06-10 DIAGNOSIS — I639 Cerebral infarction, unspecified: Secondary | ICD-10-CM | POA: Diagnosis not present

## 2022-06-12 DIAGNOSIS — I639 Cerebral infarction, unspecified: Secondary | ICD-10-CM | POA: Diagnosis not present

## 2022-06-13 DIAGNOSIS — I639 Cerebral infarction, unspecified: Secondary | ICD-10-CM | POA: Diagnosis not present

## 2022-06-14 DIAGNOSIS — I639 Cerebral infarction, unspecified: Secondary | ICD-10-CM | POA: Diagnosis not present

## 2022-06-15 ENCOUNTER — Ambulatory Visit (INDEPENDENT_AMBULATORY_CARE_PROVIDER_SITE_OTHER): Payer: Medicaid Other | Admitting: Family Medicine

## 2022-06-15 ENCOUNTER — Encounter: Payer: Self-pay | Admitting: Family Medicine

## 2022-06-15 VITALS — BP 135/89 | HR 64 | Temp 98.0°F | Ht 73.0 in | Wt 196.0 lb

## 2022-06-15 DIAGNOSIS — R062 Wheezing: Secondary | ICD-10-CM

## 2022-06-15 DIAGNOSIS — J449 Chronic obstructive pulmonary disease, unspecified: Secondary | ICD-10-CM | POA: Diagnosis not present

## 2022-06-15 DIAGNOSIS — E782 Mixed hyperlipidemia: Secondary | ICD-10-CM | POA: Diagnosis not present

## 2022-06-15 DIAGNOSIS — K219 Gastro-esophageal reflux disease without esophagitis: Secondary | ICD-10-CM | POA: Diagnosis not present

## 2022-06-15 DIAGNOSIS — I639 Cerebral infarction, unspecified: Secondary | ICD-10-CM | POA: Diagnosis not present

## 2022-06-15 DIAGNOSIS — I10 Essential (primary) hypertension: Secondary | ICD-10-CM | POA: Diagnosis not present

## 2022-06-15 DIAGNOSIS — R7303 Prediabetes: Secondary | ICD-10-CM | POA: Diagnosis not present

## 2022-06-15 LAB — BAYER DCA HB A1C WAIVED: HB A1C (BAYER DCA - WAIVED): 6.1 % — ABNORMAL HIGH (ref 4.8–5.6)

## 2022-06-15 MED ORDER — GUAIFENESIN ER 600 MG PO TB12: 600.0000 mg | ORAL_TABLET | Freq: Two times a day (BID) | ORAL | 0 refills | Status: DC

## 2022-06-15 MED ORDER — METHYLPREDNISOLONE ACETATE 40 MG/ML IJ SUSP
40.0000 mg | Freq: Once | INTRAMUSCULAR | Status: AC
  Administered 2022-06-15: 60 mg via INTRAMUSCULAR

## 2022-06-15 MED ORDER — PANTOPRAZOLE SODIUM 40 MG PO TBEC: 40.0000 mg | DELAYED_RELEASE_TABLET | Freq: Every day | ORAL | 1 refills | Status: DC

## 2022-06-15 MED ORDER — ATORVASTATIN CALCIUM 40 MG PO TABS: 40.0000 mg | ORAL_TABLET | Freq: Every day | ORAL | 1 refills | Status: DC

## 2022-06-15 NOTE — Progress Notes (Signed)
Subjective:  Patient ID: Alice [name], female    DOB: 1965/05/02, 57 y.o.   MRN: [id number]  Patient Care Team: Sonny Masters, FNP as PCP - General (Family Medicine) Heidi Dach, RN as Case Manager   Chief Complaint:  Prediabetes (3 month follow up) and Cough (X 1 week)   HPI: Alice [name] is a 57 y.o. female presenting on 06/15/2022 for Prediabetes (3 month follow up) and Cough (X 1 week)   1. Primary hypertension Has been fairly controlled with diet. Denies chest pain, leg swelling, weakness, confusion, headaches, palpitations, or syncope.   2. Prediabetes Last A1C 5.9. States she has been trying to watch her sugar intake and is drinking more water. No polyuria, polyphagia, or polydipsia.   3. Gastroesophageal reflux disease without esophagitis On PPI therapy and tolerating well. Denies dysphagia, voice change, melena, hemoptysis, or hematochezia. States well controlled with current regimen.   4. Mixed hyperlipidemia On statin therapy and tolerating well. Has made some dietary changes since last visit to try to increase HDL.   5. COPD mixed type (HCC) States she was taken off of all of her inhalers by the pulmonologist, states she is only doing albuterol nebs. Reports increased cough over the last week. No increased sputum production, shortness or breath or fatigue. No fever, chills, weakness, or confusion.      Relevant past medical, surgical, family, and social history reviewed and updated as indicated.  Allergies and medications reviewed and updated. Data reviewed: Chart in Epic.   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    BACK SURGERY     hardware in back   cyst removed from ovary     FRACTURE SURGERY     LUMBAR SPINE   removal of BB in finger  2008   SALIVARY GLAND SURGERY     tumor reomoved from stomach      Social History   Socioeconomic History   Marital status: Widowed    Spouse name: Not on file   Number of children: Not on file   Years of education: Not on file   Highest education level: GED or equivalent  Occupational History   Occupation: disabled  Tobacco Use   Smoking status: Every Day    Packs/day: 0.50    Years: 40.00    Additional pack years: 0.00    Total pack years: 20.00    Types: Cigarettes   Smokeless tobacco: Never  Vaping Use   Vaping Use: Never used  Substance and Sexual Activity   Alcohol use: Not Currently    Comment: today   Drug use: Not Currently    Types: Heroin, "Crack" cocaine, Oxycodone, Cocaine    Comment: last used 05/24/20   Sexual activity: Not Currently  Other Topics Concern   Not on file  Social History Narrative   Single, 2 children; disabled.    Social Determinants of Health   Financial Resource Strain: Medium Risk (04/11/2022)   Overall Financial Resource Strain (CARDIA)    Difficulty of Paying Living Expenses: Somewhat  hard  Food Insecurity: Food Insecurity Present (04/15/2022)   Hunger Vital Sign    Worried About Running Out of Food in the Last Year: Never true    Ran Out of Food in the Last Year: Sometimes true  Transportation Needs: Unmet Transportation Needs (04/11/2022)   PRAPARE - Transportation    Lack of Transportation (Medical): Yes    Lack of Transportation (Non-Medical): Yes  Physical Activity: Insufficiently Active (04/11/2022)   Exercise Vital Sign    Days of Exercise per Week: 2 days    Minutes of Exercise per Session: 40 min  Stress: No Stress Concern Present (04/11/2022)   [employer] of Occupational Health - Occupational Stress Questionnaire    Feeling of Stress : Only a little  Social Connections: Moderately  Integrated (04/11/2022)   Social Connection and Isolation Panel [NHANES]    Frequency of Communication with Friends and Family: More than three times a week    Frequency of Social Gatherings with Friends and Family: More than three times a week    Attends Religious Services: More than 4 times per year    Active Member of [employer] or Organizations: No    Attends Banker Meetings: Not on file    Marital Status: Married  Intimate Partner Violence: Not on file    Outpatient Encounter Medications as of 06/15/2022  Medication Sig   aspirin EC 81 MG tablet Take 1 tablet (81 mg total) by mouth daily with breakfast. For Stroke Prevention   atorvastatin (LIPITOR) 40 MG tablet Take 1 tablet (40 mg total) by mouth daily.   baclofen (LIORESAL) 10 MG tablet Take 1 tablet (10 mg total) by mouth 2 (two) times daily.   famotidine (PEPCID) 20 MG tablet One after supper (Patient taking differently: Take 20 mg by mouth daily. One after supper)   gabapentin (NEURONTIN) 100 MG capsule Take 1 capsule (100 mg total) by mouth 4 (four) times daily.   guaiFENesin (MUCINEX) 600 MG 12 hr tablet Take 1 tablet (600 mg total) by mouth 2 (two) times daily for 10 days.   mirabegron ER (MYRBETRIQ) 50 MG TB24 tablet Take 1 tablet (50 mg total) by mouth daily.   nitrofurantoin, macrocrystal-monohydrate, (MACROBID) 100 MG capsule Take 1 capsule by mouth twice a day for 10 days, then 1 capsule by mouth every day.   pantoprazole (PROTONIX) 40 MG tablet Take 1 tablet (40 mg total) by mouth daily.   polyethylene glycol powder (GLYCOLAX/MIRALAX) 17 GM/SCOOP powder Take 17 g by mouth daily.   SUBOXONE 8-2 MG FILM Place 0.5 Film under the tongue every 6 (six) hours.   [DISCONTINUED] atorvastatin (LIPITOR) 40 MG tablet Take 1 tablet (40 mg total) by mouth daily.   [DISCONTINUED] pantoprazole (PROTONIX) 40 MG tablet Take 1 tablet (40 mg total) by mouth daily.   [EXPIRED] methylPREDNISolone acetate (DEPO-MEDROL) injection 40 mg     No facility-administered encounter medications on file as of 06/15/2022.    Allergies  Allergen Reactions   Benadryl [Diphenhydramine Hcl] Hives   Codeine Rash   Tetracycline Itching and Rash    Review of Systems  Constitutional:  Negative for activity change, appetite change, chills, diaphoresis, fatigue, fever and unexpected weight change.  HENT: Negative.    Eyes: Negative.  Negative for photophobia.  Respiratory:  Positive for cough and wheezing. Negative for apnea, choking, chest tightness, shortness of breath and stridor.   Cardiovascular:  Negative for chest pain, palpitations and leg swelling.  Gastrointestinal:  Positive for abdominal pain. Negative for  blood in stool, constipation, diarrhea, nausea and vomiting.  Endocrine: Negative.  Negative for polydipsia, polyphagia and polyuria.  Genitourinary:  Negative for decreased urine volume, difficulty urinating, dysuria, frequency and urgency.  Musculoskeletal:  Positive for arthralgias, back pain, gait problem and myalgias. Negative for joint swelling, neck pain and neck stiffness.  Skin: Negative.   Allergic/Immunologic: Negative.   Neurological:  Negative for dizziness, tremors, seizures, syncope, facial asymmetry, speech difficulty, weakness, light-headedness, numbness and headaches.  Hematological: Negative.   Psychiatric/Behavioral:  Negative for confusion, hallucinations, sleep disturbance and suicidal ideas.   All other systems reviewed and are negative.       Objective:  BP 135/89   Pulse 64   Temp 98 F (36.7 C) (Temporal)   Ht 6\' 1"  (1.854 m)   Wt 196 lb (88.9 kg)   LMP 07/30/2011   SpO2 97%   BMI 25.86 kg/m    Wt Readings from Last 3 Encounters:  06/15/22 196 lb (88.9 kg)  05/20/22 195 lb 11.2 oz (88.8 kg)  04/22/22 197 lb (89.4 kg)    Physical Exam Vitals and nursing note reviewed.  Constitutional:      General: She is not in acute distress.    Appearance: She is well-developed and  well-groomed. She is not ill-appearing, toxic-appearing or diaphoretic.     Comments: Chronically ill   HENT:     Head: Normocephalic and atraumatic.     Jaw: There is normal jaw occlusion.     Right Ear: Hearing, tympanic membrane, ear canal and external ear normal.     Left Ear: Hearing, tympanic membrane, ear canal and external ear normal.     Nose: Nose normal.     Mouth/Throat:     Lips: Pink.     Mouth: Mucous membranes are moist.     Pharynx: Oropharynx is clear. Uvula midline.  Eyes:     General: Lids are normal.     Extraocular Movements: Extraocular movements intact.     Conjunctiva/sclera: Conjunctivae normal.     Pupils: Pupils are equal, round, and reactive to light.  Neck:     Thyroid: No thyroid mass, thyromegaly or thyroid tenderness.     Vascular: No carotid bruit or JVD.     Trachea: Trachea and phonation normal.  Cardiovascular:     Rate and Rhythm: Normal rate and regular rhythm.     Chest Wall: PMI is not displaced.     Pulses: Normal pulses.     Heart sounds: Normal heart sounds. No murmur heard.    No friction rub. No gallop.  Pulmonary:     Effort: Pulmonary effort is normal. No respiratory distress.     Breath sounds: Examination of the right-upper field reveals wheezing. Examination of the left-upper field reveals wheezing. Examination of the right-lower field reveals wheezing. Examination of the left-lower field reveals wheezing. Wheezing present. No rhonchi or rales.  Abdominal:     General: Bowel sounds are normal. There is no distension or abdominal bruit.     Palpations: Abdomen is soft. There is no hepatomegaly or splenomegaly.     Tenderness: There is no abdominal tenderness. There is no right CVA tenderness or left CVA tenderness.     Hernia: No hernia is present.  Musculoskeletal:     Cervical back: Normal range of motion and neck supple.     Right lower leg: No edema.     Left lower leg: No edema.     Comments: Kyphosis   Lymphadenopathy:  Cervical: No cervical adenopathy.  Skin:    General: Skin is warm and dry.     Capillary Refill: Capillary refill takes less than 2 seconds.     Coloration: Skin is not cyanotic, jaundiced or pale.     Findings: No rash.  Neurological:     General: No focal deficit present.     Mental Status: She is alert and oriented to person, place, and time.     Sensory: Sensation is intact.     Motor: Motor function is intact.     Coordination: Coordination is intact.     Gait: Gait abnormal (using rolling walker).     Deep Tendon Reflexes: Reflexes are normal and symmetric.  Psychiatric:        Attention and Perception: Attention and perception normal.        Mood and Affect: Mood and affect normal.        Speech: Speech normal.        Behavior: Behavior normal. Behavior is cooperative.        Thought Content: Thought content normal.        Cognition and Memory: Cognition and memory normal.        Judgment: Judgment normal.     Results for orders placed or performed in visit on 05/20/22  Microscopic Examination   Urine  Result Value Ref Range   WBC, UA 0-5 0 - 5 /hpf   RBC, Urine None seen 0 - 2 /hpf   Epithelial Cells (non renal) 0-10 0 - 10 /hpf   Renal Epithel, UA Present (A) None seen /hpf   Casts None seen None seen /lpf   Cast Type None seen N/A   Crystals None seen N/A   Crystal Type None seen N/A   Mucus, UA None seen Not Estab.   Bacteria, UA Moderate (A) None seen/Few   Yeast, UA None seen None seen   Trichomonas, UA None seen None seen  Urinalysis, Routine w reflex microscopic  Result Value Ref Range   Specific Gravity, UA 1.010 1.005 - 1.030   pH, UA 7.0 5.0 - 7.5   Color, UA Yellow Yellow   Appearance Ur Clear Clear   Leukocytes,UA Trace (A) Negative   Protein,UA Negative Negative/Trace   Glucose, UA Negative Negative   Ketones, UA Negative Negative   RBC, UA Negative Negative   Bilirubin, UA Negative Negative   Urobilinogen, Ur 0.2 0.2 - 1.0 mg/dL   Nitrite,  UA Negative Negative   Microscopic Examination See below:        Pertinent labs & imaging results that were available during my care of the patient were reviewed by me and considered in my medical decision making.  Assessment & Plan:  Alice [name] was seen today for prediabetes and cough.  Diagnoses and all orders for this visit:  Primary hypertension Well controlled with diet, will continue. DASH diet and exercise encouraged.  -     Lipid panel -     CMP14+EGFR -     Bayer DCA Hb A1c Waived  Prediabetes A1C 6.1 today. Continue with dietary and lifestyle changes. Follow up in 6 months for reevaluation, sooner if warranted.  -     Lipid panel -     CMP14+EGFR -     Bayer DCA Hb A1c Waived  Gastroesophageal reflux disease without esophagitis No red flags present. Diet discussed. Avoid fried, spicy, fatty, greasy, and acidic foods. Avoid caffeine, nicotine, and alcohol. Do not eat 2-3 hours before bedtime  and stay upright for at least 1-2 hours after eating. Eat small frequent meals. Avoid NSAID's like motrin and aleve. Medications as prescribed. Report any new or worsening symptoms. Follow up as discussed or sooner if needed.   -     pantoprazole (PROTONIX) 40 MG tablet; Take 1 tablet (40 mg total) by mouth daily.  Mixed hyperlipidemia Diet encouraged - increase intake of fresh fruits and vegetables, increase intake of lean proteins. Bake, broil, or grill foods. Avoid fried, greasy, and fatty foods. Avoid fast foods. Increase intake of fiber-rich whole grains. Exercise encouraged - at least 150 minutes per week and advance as tolerated.  Goal BMI < 25. Continue medications as prescribed. Follow up in 3-6 months as discussed.  -     Lipid panel -     CMP14+EGFR -     atorvastatin (LIPITOR) 40 MG tablet; Take 1 tablet (40 mg total) by mouth daily.  COPD mixed type (HCC) Wheezing Increase in cough over last week. No increased shortness of breath, sputum production, or fatigue. Has not been  taking Mucinex, will start, aware to drink plenty of water. Will burst with steroids in office today. Pt aware to speak with pulmonologist about current treatment plan. Aware to return if symptoms persist or worsen.  -     guaiFENesin (MUCINEX) 600 MG 12 hr tablet; Take 1 tablet (600 mg total) by mouth 2 (two) times daily for 10 days. -     methylPREDNISolone acetate (DEPO-MEDROL) injection 40 mg     Continue all other maintenance medications.  Follow up plan: Return in about 6 months (around 12/15/2022) for chronic follow up.   Continue healthy lifestyle choices, including diet (rich in fruits, vegetables, and lean proteins, and low in salt and simple carbohydrates) and exercise (at least 30 minutes of moderate physical activity daily).  Educational handout given for DM  The above assessment and management plan was discussed with the patient. The patient verbalized understanding of and has agreed to the management plan. Patient is aware to call the clinic if they develop any new symptoms or if symptoms persist or worsen. Patient is aware when to return to the clinic for a follow-up visit. Patient educated on when it is appropriate to go to the emergency department.   Kari Baars, FNP-C [facility] [phone] '

## 2022-06-15 NOTE — Patient Instructions (Addendum)

## 2022-06-16 ENCOUNTER — Other Ambulatory Visit: Payer: Medicaid Other | Admitting: *Deleted

## 2022-06-16 DIAGNOSIS — I639 Cerebral infarction, unspecified: Secondary | ICD-10-CM | POA: Diagnosis not present

## 2022-06-16 LAB — CMP14+EGFR
ALT: 10 IU/L (ref 0–32)
AST: 18 IU/L (ref 0–40)
Albumin/Globulin Ratio: 1.6 (ref 1.2–2.2)
Albumin: 4.4 g/dL (ref 3.8–4.9)
Alkaline Phosphatase: 125 IU/L — ABNORMAL HIGH (ref 44–121)
BUN/Creatinine Ratio: 9 (ref 9–23)
BUN: 7 mg/dL (ref 6–24)
Bilirubin Total: 0.4 mg/dL (ref 0.0–1.2)
CO2: 26 mmol/L (ref 20–29)
Calcium: 9.8 mg/dL (ref 8.7–10.2)
Chloride: 103 mmol/L (ref 96–106)
Creatinine, Ser: 0.74 mg/dL (ref 0.57–1.00)
Globulin, Total: 2.7 g/dL (ref 1.5–4.5)
Glucose: 102 mg/dL — ABNORMAL HIGH (ref 70–99)
Potassium: 5.2 mmol/L (ref 3.5–5.2)
Sodium: 142 mmol/L (ref 134–144)
Total Protein: 7.1 g/dL (ref 6.0–8.5)
eGFR: 94 mL/min/{1.73_m2} (ref >59)

## 2022-06-16 LAB — LIPID PANEL
Chol/HDL Ratio: 2.3 ratio (ref 0.0–4.4)
Cholesterol, Total: 105 mg/dL (ref 100–199)
HDL: 45 mg/dL (ref >39)
LDL Chol Calc (NIH): 44 mg/dL (ref 0–99)
Triglycerides: 76 mg/dL (ref 0–149)
VLDL Cholesterol Cal: 16 mg/dL (ref 5–40)

## 2022-06-16 NOTE — Patient Instructions (Signed)
Visit Information  [name]  - as a part of your Medicaid benefit, you are eligible for care management and care coordination services at no cost or copay. I was unable to reach you by phone today but would be happy to help you with your health related needs. Please feel free to call me @ [phone].   A member of the Managed Medicaid care management team will reach out to you again over the next 7 days.   Estanislado Emms RN, BSN [place]  Managed [facility] RN Care Coordinator [phone]

## 2022-06-16 NOTE — Patient Outreach (Signed)
  Medicaid Managed Care   Unsuccessful Attempt Note   06/16/2022 Name: Alice [name] MRN: [id number] DOB: 18-Feb-1965  Referred by: Sonny Masters, FNP Reason for referral : High Risk Managed Medicaid (Unsuccessful RNCM follow up telephone outreach)   An unsuccessful telephone outreach was attempted today. The patient was referred to the case management team for assistance with care management and care coordination.    Follow Up Plan: A HIPAA compliant phone message was left for the patient providing contact information and requesting a return call. and The Managed Medicaid care management team will reach out to the patient again over the next 7 days.    Estanislado Emms RN, BSN [place]  Managed [facility] RN Care Coordinator [phone]

## 2022-06-17 ENCOUNTER — Telehealth: Payer: Self-pay | Admitting: Family Medicine

## 2022-06-17 ENCOUNTER — Other Ambulatory Visit: Payer: Self-pay | Admitting: Family Medicine

## 2022-06-17 DIAGNOSIS — K838 Other specified diseases of biliary tract: Secondary | ICD-10-CM

## 2022-06-17 DIAGNOSIS — I639 Cerebral infarction, unspecified: Secondary | ICD-10-CM | POA: Diagnosis not present

## 2022-06-17 NOTE — Telephone Encounter (Signed)
Lmtcb.

## 2022-06-17 NOTE — Progress Notes (Signed)
MRCP ordered.

## 2022-06-17 NOTE — Telephone Encounter (Signed)
Common bile duct is dilated. Could be from gallstones. Will order MRCP.

## 2022-06-17 NOTE — Telephone Encounter (Signed)
Pt wants PCP to review imaging results and call her to explain results.

## 2022-06-18 DIAGNOSIS — I639 Cerebral infarction, unspecified: Secondary | ICD-10-CM | POA: Diagnosis not present

## 2022-06-19 DIAGNOSIS — I639 Cerebral infarction, unspecified: Secondary | ICD-10-CM | POA: Diagnosis not present

## 2022-06-20 DIAGNOSIS — I639 Cerebral infarction, unspecified: Secondary | ICD-10-CM | POA: Diagnosis not present

## 2022-06-21 DIAGNOSIS — I639 Cerebral infarction, unspecified: Secondary | ICD-10-CM | POA: Diagnosis not present

## 2022-06-22 DIAGNOSIS — I639 Cerebral infarction, unspecified: Secondary | ICD-10-CM | POA: Diagnosis not present

## 2022-06-22 NOTE — Addendum Note (Signed)
Addended by: Sonny Masters on: 06/22/2022 08:31 AM   Modules accepted: Orders

## 2022-06-23 ENCOUNTER — Other Ambulatory Visit: Payer: Medicaid Other | Admitting: *Deleted

## 2022-06-23 ENCOUNTER — Ambulatory Visit (INDEPENDENT_AMBULATORY_CARE_PROVIDER_SITE_OTHER): Payer: Medicaid Other | Admitting: Urology

## 2022-06-23 ENCOUNTER — Telehealth: Payer: Self-pay

## 2022-06-23 ENCOUNTER — Encounter: Payer: Self-pay | Admitting: Urology

## 2022-06-23 VITALS — BP 138/88 | HR 60 | Ht 72.0 in | Wt 197.0 lb

## 2022-06-23 DIAGNOSIS — Z87440 Personal history of urinary (tract) infections: Secondary | ICD-10-CM | POA: Diagnosis not present

## 2022-06-23 DIAGNOSIS — N390 Urinary tract infection, site not specified: Secondary | ICD-10-CM

## 2022-06-23 DIAGNOSIS — N3941 Urge incontinence: Secondary | ICD-10-CM

## 2022-06-23 DIAGNOSIS — I639 Cerebral infarction, unspecified: Secondary | ICD-10-CM | POA: Diagnosis not present

## 2022-06-23 DIAGNOSIS — R350 Frequency of micturition: Secondary | ICD-10-CM

## 2022-06-23 MED ORDER — NITROFURANTOIN MONOHYD MACRO 100 MG PO CAPS: 100.0000 mg | ORAL_CAPSULE | Freq: Every day | ORAL | 1 refills | Status: DC

## 2022-06-23 NOTE — Telephone Encounter (Signed)
..  Patient declines further follow up and engagement by the Managed Medicaid Team. Appropriate care team members and provider have been notified via electronic communication. The Managed Medicaid Team is available to follow up with the patient after provider conversation with the patient regarding recommendation for engagement and subsequent re-referral to the Managed Medicaid Team.     Darenda Fike Care Guide  Medicaid Managed  Care Guide [place]  [phone]  

## 2022-06-23 NOTE — Progress Notes (Signed)
Assessment: 1. Frequent UTI   2. Urinary frequency   3. Urge incontinence     Plan: I personally reviewed the CT study from 06/05/2022 with results as noted below. CT results discussed with the patient. Continue methods to reduce the risk of UTIs including increase fluid intake, timed and double voiding, daily cranberry supplement, and daily probiotics. Continue daily Macrobid for UTI prevention I discussed a trial of an alternative medication for her urgency and urge incontinence.  She feels like she is doing better and would like to monitor her symptoms at the present time. Return to office in 1 month  Chief Complaint:  Chief Complaint  Patient presents with   Cysto    History of Present Illness:  Alice [name] is a 57 y.o. female who is seen for further evaluation of frequent UTI's and voiding symptoms. At her visit in 4/24, she reported onset of her symptoms approximately 2 months prior.  She has baseline symptoms of frequency, urgency, and urge incontinence.  She uses incontinence pads on a daily basis.  She has had increased frequency, urgency, and bladder discomfort.  She was diagnosed with a UTI in March 2024.  Urine culture grew 25-50 K E. coli.  She was treated with antibiotics.  A repeat urine culture from 04/14/2022 again grew 25-50 K E. coli.  She was on Augmentin x 10 days for treatment.  She noted some improvement in her symptoms.  No prior history of UTIs.  No gross hematuria or flank pain. Resolve MDX culture from 05/20/2022 grew Enterococcus, E. coli, and Klebsiella.  She was subsequently treated with Macrobid and started on daily Macrobid for UTI prevention. She was given a trial of Myrbetriq for her frequency, urgency and urge incontinence.  She presents today for further evaluation.  She recently had a CT abdomen and pelvis without contrast which showed no renal or ureteral calculi, no renal mass, no evidence of obstruction and pelvic floor laxity. She is scheduled to  undergo cystoscopy today. No current UTI symptoms.  She discontinued the Myrbetriq as she did not see improvement in her symptoms. She does feel like her incontinence has improved.   Portions of the above documentation were copied from a prior visit for review purposes only.   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   BACK SURGERY     hardware in back   cyst removed from ovary     FRACTURE SURGERY     LUMBAR SPINE   removal of BB in finger  2008   SALIVARY GLAND SURGERY     tumor reomoved from stomach      Allergies:  Allergies  Allergen Reactions   Benadryl [Diphenhydramine Hcl] Hives   Codeine Rash   Tetracycline Itching and Rash    Family History:  Family History  Problem Relation Age of Onset   Hypertension Mother    Cancer Father    CVA Neg Hx     Social History:  Social History   Tobacco Use  Smoking status: Every Day    Packs/day: 0.50    Years: 40.00    Additional pack years: 0.00    Total pack years: 20.00    Types: Cigarettes   Smokeless tobacco: Never  Vaping Use   Vaping Use: Never used  Substance Use Topics   Alcohol use: Not Currently    Comment: today   Drug use: Not Currently    Types: Heroin, "Crack" cocaine, Oxycodone, Cocaine    Comment: last used 05/24/20    ROS: Constitutional:  Negative for fever, chills, weight loss CV: Negative for chest pain, previous MI, hypertension Respiratory:  Negative for shortness of breath, wheezing, sleep apnea, frequent cough GI:  Negative for nausea, vomiting, bloody stool, GERD  Physical exam: LMP 07/30/2011   GENERAL APPEARANCE:  Well appearing, well developed, well nourished, NAD HEENT:  Atraumatic, normocephalic, oropharynx clear NECK:  Supple without lymphadenopathy or thyromegaly ABDOMEN:  Soft, non-tender, no masses EXTREMITIES:  Moves all extremities well, without clubbing, cyanosis, or edema NEUROLOGIC:  Alert and oriented x 3, normal gait, CN II-XII grossly intact MENTAL STATUS:  appropriate BACK:  Non-tender to palpation, No CVAT SKIN:  Warm, dry, and intact  Results: U/A: negative  CYSTOSCOPY  Procedure: Flexible cystoscopy  Pre-Operative Diagnosis:   Frequent UTIs  Post-Operative Diagnosis:  Frequent UTIs  Anesthesia: local with lidocaine gel  Surgical Narrative:  After appropriate informed consent was obtained, the patient was prepped and draped in the usual sterile fashion in the supine position. She was correctly identified and the proper procedure delineated prior to proceeding. Sterile lidocaine gel was instilled in the urethra.  The flexible cystoscope was introduced without difficulty.  Findings:  Urethra: Normal  Bladder: Normal  Ureteral orifices: normal  Additional findings: none  A bladder wash was not obtained for cytology.  Pelvic exam was not positive for pelvic prolapse.  She tolerated the procedure well.  A chaperone was present throughout the procedure.

## 2022-06-23 NOTE — Patient Outreach (Signed)
   Care Management/Care Coordination  RN Case Manager Case Closure Note  06/23/2022 Name: Alice [name] MRN: [id number] DOB: [birth year]  Alice [name] is a 57 y.o. year old female who is a primary care patient of Rakes, Doralee Albino, FNP. The care management/care coordination team was consulted for assistance with chronic disease management and/or care coordination needs.   Care Plan : RN Care Manager Plan of Care  Updates made by Heidi Dach, RN since 06/23/2022 12:00 AM  Completed 06/23/2022   Problem: Health Management needs related to Stroke History Resolved 06/23/2022     Long-Range Goal: Development of Plan of Care to address Health Management needs related to Stroke History Completed 06/23/2022  Start Date: 03/10/2022  Expected End Date: 07/08/2022  Note:   Current Barriers:  Chronic Disease Management support and education needs related to Stroke History  RNCM Clinical Goal(s):  Patient will verbalize understanding of plan for management of Stroke History as evidenced by patient reports take all medications exactly as prescribed and will call provider for medication related questions as evidenced by patient reports and EMR documentation    attend all scheduled medical appointments: 04/14/22 with PCP, 04/15/22 with BSW, PT on 04/15/22 and 04/22/22 and Pulmonology on 04/28/22 as evidenced by provider documentation        work with Child psychotherapist to address Financial constraints related to utilities, food and housing resources related to the management of Stroke History as evidenced by review of EMR and patient or Child psychotherapist report     through collaboration with Medical illustrator, provider, and care team   Interventions: Inter-disciplinary care team collaboration (see longitudinal plan of care) Evaluation of current treatment plan related to  self management and patient's adherence to plan as established by provider Discussed patient has located new housing and will move in on 04/26/22  Stroke:   (Status:Patient declined further engagement on this goal.) Long Term Goal Reviewed Importance of taking all medications as prescribed Reviewed Importance of attending all scheduled provider appointments Advised to report any changes in symptoms or exercise tolerance Assessed social determinant of health barriers Reviewed referrals to outpatient therapy Reviewed the importance of exercise Advised patient to schedule with PT for continued care Collaborated with PCP to request needed DME, shower chair Provided encouragement for patient on her sobriety Reviewed Neurology provider notes and discussed with patient  Patient Goals/Self-Care Activities: Take medications as prescribed   Attend all scheduled provider appointments Call provider office for new concerns or questions  Work with the social worker to address care coordination needs and will continue to work with the clinical team to address health care and disease management related needs       Plan: The patient declines further follow up and engagement by the care management team and the case will be closed. Appropriate care team members and provider have been notified via electronic communication and the patient has been provided contact information for the care management team. The care management team is available to at any time in the future should needs arise.   Estanislado Emms RN, BSN [place]  Managed [facility] RN Care Coordinator [phone]

## 2022-06-24 DIAGNOSIS — I639 Cerebral infarction, unspecified: Secondary | ICD-10-CM | POA: Diagnosis not present

## 2022-06-25 DIAGNOSIS — I639 Cerebral infarction, unspecified: Secondary | ICD-10-CM | POA: Diagnosis not present

## 2022-06-26 DIAGNOSIS — I639 Cerebral infarction, unspecified: Secondary | ICD-10-CM | POA: Diagnosis not present

## 2022-06-27 DIAGNOSIS — I639 Cerebral infarction, unspecified: Secondary | ICD-10-CM | POA: Diagnosis not present

## 2022-06-28 DIAGNOSIS — I639 Cerebral infarction, unspecified: Secondary | ICD-10-CM | POA: Diagnosis not present

## 2022-06-29 DIAGNOSIS — I639 Cerebral infarction, unspecified: Secondary | ICD-10-CM | POA: Diagnosis not present

## 2022-06-30 DIAGNOSIS — I639 Cerebral infarction, unspecified: Secondary | ICD-10-CM | POA: Diagnosis not present

## 2022-06-30 LAB — URINALYSIS, ROUTINE W REFLEX MICROSCOPIC
Bilirubin, UA: NEGATIVE
Glucose, UA: NEGATIVE
Ketones, UA: NEGATIVE
Leukocytes,UA: NEGATIVE
Nitrite, UA: NEGATIVE
Protein,UA: NEGATIVE
RBC, UA: NEGATIVE
Specific Gravity, UA: 1.01 (ref 1.005–1.030)
Urobilinogen, Ur: 0.2 mg/dL (ref 0.2–1.0)
pH, UA: 6.5 (ref 5.0–7.5)

## 2022-06-30 NOTE — Telephone Encounter (Signed)
Patient aware and verbalizes understanding. 

## 2022-07-01 DIAGNOSIS — I639 Cerebral infarction, unspecified: Secondary | ICD-10-CM | POA: Diagnosis not present

## 2022-07-02 DIAGNOSIS — I639 Cerebral infarction, unspecified: Secondary | ICD-10-CM | POA: Diagnosis not present

## 2022-07-03 DIAGNOSIS — I639 Cerebral infarction, unspecified: Secondary | ICD-10-CM | POA: Diagnosis not present

## 2022-07-04 DIAGNOSIS — I639 Cerebral infarction, unspecified: Secondary | ICD-10-CM | POA: Diagnosis not present

## 2022-07-05 DIAGNOSIS — I639 Cerebral infarction, unspecified: Secondary | ICD-10-CM | POA: Diagnosis not present

## 2022-07-06 DIAGNOSIS — I639 Cerebral infarction, unspecified: Secondary | ICD-10-CM | POA: Diagnosis not present

## 2022-07-07 ENCOUNTER — Other Ambulatory Visit (HOSPITAL_COMMUNITY): Payer: Self-pay

## 2022-07-07 DIAGNOSIS — I639 Cerebral infarction, unspecified: Secondary | ICD-10-CM | POA: Diagnosis not present

## 2022-07-08 DIAGNOSIS — I639 Cerebral infarction, unspecified: Secondary | ICD-10-CM | POA: Diagnosis not present

## 2022-07-08 NOTE — Progress Notes (Signed)
Alice [name], female    DOB: 1965-03-23    MRN: [id number]   Brief patient profile:  29 yowf  active smoker  referred to pulmonary clinic in [place]  01/14/2022 by Gilford Silvius  for copd eval     History of Present Illness  01/14/2022  Pulmonary/ 1st office eval/ Ivannah Zody / [facility] maint on Anoro /ACEi / low dose ppi  Chief Complaint  Patient presents with   Consult    SOB/ COPD   Dyspnea:  baseline (oct 2023) still needing neb 3 x daily  Still house work  / doe across the house front door to kitchen ride scooter to shop / walks bent over walking   Cough: nonp productive day > noct (as long as sleeps at 45 degrees) even when better still coughing but much worse since Nov 2023 with bilateral sym lat cp just with coughing fits   Sleep: much worse when try to sleep flat so using recliner x 45 degrees  SABA use: neb 3 x daily  02: none  Vax never vax/ once infected   Rec Pantoprazole (protonix) 40 mg   Take  30-60 min before first meal of the day and add  Pepcid (famotidine)  20 mg after supper until return to office - this is the best way to tell whether stomach acid is contributing to your problem.   Stop all smoking Stop lisinopil and anoro  Just use your nebulizer up every 4 hours as needed for cough or breathing problems  Diovan 160 (valsartan) one daily  -  ok to break in half if too strong Gabapentin 100 mg increased to 4 x daily  For cough > mucinex dm 1200 mg every 12 hours and avoid mint menthol chocolate  Return with all  inhalers/ solutions in hand       07/09/2022  f/u ov/[place] office/Aqeel Norgaard re: ? Acei case maint on did not  bring meds  Chief Complaint  Patient presents with   Follow-up  Dyspnea:  rollator  limited by back x 7 min  Cough: non productive, has not tried mucinex dm yet Sleeping: in bed 45 degrees - no longer in recliner  SABA use: already used 4 h prior to ov 02: none     No obvious day to day or daytime variability or assoc excess/ purulent  sputum or mucus plugs or hemoptysis or cp or chest tightness, subjective wheeze or overt sinus or hb symptoms.   Sleeping as above  without nocturnal  or early am exacerbation  of respiratory  c/o's or need for noct saba. Also denies any obvious fluctuation of symptoms with weather or environmental changes or other aggravating or alleviating factors except as outlined above   No unusual exposure hx or h/o childhood pna/ asthma or knowledge of premature birth.  Current Allergies, Complete Past Medical History, Past Surgical History, Family History, and Social History were reviewed in [employer] record.  ROS  The following are not active complaints unless bolded Hoarseness, sore throat, dysphagia, dental problems, itching, sneezing,  nasal congestion or discharge of excess mucus or purulent secretions, ear ache,   fever, chills, sweats, unintended wt loss or wt gain, classically pleuritic or exertional cp,  orthopnea pnd or arm/hand swelling  or leg swelling, presyncope, palpitations, abdominal pain, anorexia, nausea, vomiting, diarrhea  or change in bowel habits or change in bladder habits, change in stools or change in urine, dysuria, hematuria,  rash, arthralgias, visual complaints, headache, numbness, weakness or  ataxia or problems with walking or coordination,  change in mood or  memory.        Current Meds  Medication Sig   aspirin EC 81 MG tablet Take 1 tablet (81 mg total) by mouth daily with breakfast. For Stroke Prevention   atorvastatin (LIPITOR) 40 MG tablet Take 1 tablet (40 mg total) by mouth daily.   baclofen (LIORESAL) 10 MG tablet Take 1 tablet (10 mg total) by mouth 2 (two) times daily.   famotidine (PEPCID) 20 MG tablet One after supper (Patient taking differently: Take 20 mg by mouth daily. One after supper)   gabapentin (NEURONTIN) 100 MG capsule Take 1 capsule (100 mg total) by mouth 4 (four) times daily.   nitrofurantoin, macrocrystal-monohydrate,  (MACROBID) 100 MG capsule Take 1 capsule by mouth twice a day for 10 days, then 1 capsule by mouth every day.   nitrofurantoin, macrocrystal-monohydrate, (MACROBID) 100 MG capsule Take 1 capsule (100 mg total) by mouth daily.   pantoprazole (PROTONIX) 40 MG tablet Take 1 tablet (40 mg total) by mouth daily.   polyethylene glycol powder (GLYCOLAX/MIRALAX) 17 GM/SCOOP powder Take 17 g by mouth daily.   SUBOXONE 8-2 MG FILM Place 0.5 Film under the tongue every 6 (six) hours.             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   Qualifier: Diagnosis of  By: Zachary George     Chronic low back pain    Cocaine abuse (HCC)    COPD (chronic obstructive pulmonary disease) (HCC)    Heart murmur    HLD (hyperlipidemia)    Hypertension    Stroke (HCC)    x4     Objective:     Wt Readings from Last 3 Encounters:  07/09/22 189 lb 3.2 oz (85.8 kg)  06/23/22 197 lb (89.4 kg)  06/15/22 196 lb (88.9 kg)     Vital signs reviewed  07/09/2022  - Note at rest 02 sats  96% on RA   General appearance:    amb (with rollator) wf nad   HEENT : Oropharynx  clear     NECK :  without  apparent JVD/ palpable Nodes/TM    LUNGS: no acc muscle use,  Min barrel  contour chest wall with bilateral  slightly decreased bs s audible wheeze and  without cough on insp or exp maneuvers and min  Hyperresonant  to  percussion bilaterally    CV:  RRR  no s3 or murmur or increase in P2, and no edema   ABD:  soft and nontender with pos end  insp Hoover's  in the supine position.  No bruits or organomegaly appreciated   MS:   ext warm without deformities Or obvious joint restrictions  calf tenderness, cyanosis or clubbing     SKIN: warm and dry without lesions    NEURO:  alert, approp, nl sensorium with  no motor or cerebellar deficits apparent.                I personally reviewed images and agree with radiology impression as follows:   Chest CTa    07/18/21  1. No evidence of  pulmonary embolus. 2. Small area of atelectasis versus airspace consolidation in the lateral aspect of the lingula. 3. Possible mild interstitial pulmonary edema. 4. Moderate in size hiatal hernia with diffuse dilation of the esophagus. 5. Chronic thoracic spine fractures.   CXR PA and Lateral:   01/14/2022 :  I personally reviewed images and impression is as follows:     Mod kyphoscolisis/ hyperinflation/no acute changes    Assessment

## 2022-07-09 ENCOUNTER — Encounter: Payer: Self-pay | Admitting: Internal Medicine

## 2022-07-09 ENCOUNTER — Ambulatory Visit (INDEPENDENT_AMBULATORY_CARE_PROVIDER_SITE_OTHER): Payer: Medicaid Other | Admitting: Internal Medicine

## 2022-07-09 ENCOUNTER — Ambulatory Visit (HOSPITAL_COMMUNITY)
Admission: RE | Admit: 2022-07-09 | Discharge: 2022-07-09 | Disposition: A | Discharge status: Home / Self Care | Payer: Medicaid Other | Source: Ambulatory Visit | Attending: Internal Medicine | Admitting: Internal Medicine

## 2022-07-09 ENCOUNTER — Encounter (HOSPITAL_COMMUNITY): Payer: Self-pay | Admitting: Emergency Medicine

## 2022-07-09 VITALS — BP 119/78 | HR 58 | Ht 72.0 in | Wt 189.2 lb

## 2022-07-09 DIAGNOSIS — F17210 Nicotine dependence, cigarettes, uncomplicated: Secondary | ICD-10-CM | POA: Insufficient documentation

## 2022-07-09 DIAGNOSIS — Z87891 Personal history of nicotine dependence: Secondary | ICD-10-CM | POA: Diagnosis not present

## 2022-07-09 DIAGNOSIS — J449 Chronic obstructive pulmonary disease, unspecified: Secondary | ICD-10-CM | POA: Diagnosis not present

## 2022-07-09 DIAGNOSIS — I639 Cerebral infarction, unspecified: Secondary | ICD-10-CM | POA: Diagnosis not present

## 2022-07-09 NOTE — Assessment & Plan Note (Signed)
4-5 min discussion re active cigarette smoking in addition to office E&M  Ask about tobacco use:   ongoing Advise quitting   I took an extended  opportunity with this patient to outline the consequences of continued cigarette use  in airway disorders based on all the data we have from the multiple national lung health studies (perfomed over decades at millions of dollars in cost)  indicating that smoking cessation, not choice of inhalers or pulmonary physicians, is the most important aspect of her care.   Assess willingness:  she's trying but still coupling cigs with specific activities eg p meals  Assist in quit attempt: rec uncoupling activities with cigs/  Per PCP when ready Arrange follow up:   Follow up per Primary Care planned     Low-dose CT lung cancer screening is recommended for patients who are 30-36 years of age with a 20+ pack-year history of smoking and who are currently smoking or quit <=15 years ago. No coughing up blood  No unintentional weight loss of > 15 pounds in the last 6 months - pt is eligible for scanning yearly until 3 y p quits > referred for shared decision  making

## 2022-07-09 NOTE — Assessment & Plan Note (Signed)
Active smoker with main concern cough as of 01/14/22  - 01/14/22 d/c acei > cough resolved then recurred on macrodantin   Will leave on alb neb until cough is adequately addressed and continue max gerd rx / mucinex dm and f/u with cxr today  and pfts prior to next ov in 47m

## 2022-07-09 NOTE — Patient Instructions (Addendum)
My office will be contacting you by phone for referral for lung cancer screening   - if you don't hear back from my office within one week please call us back or notify us thru MyChart and we'll address it right away.   Take two of gabapentin in evening should help with cough and can adjust upward with your pain doctor 07/12/22 if needed  For cough/ congestion >  mucinex dm  up to maximum of  1200 mg every 12 hours as needed     The key is to stop smoking completely before smoking completely stops you!  Please remember to go to the  x-ray department  @  [facility] for your tests - we will call you with the results when they are available     Please schedule a follow up visit in 3 months but call sooner if needed with pfts on return

## 2022-07-10 DIAGNOSIS — I639 Cerebral infarction, unspecified: Secondary | ICD-10-CM | POA: Diagnosis not present

## 2022-07-11 DIAGNOSIS — I639 Cerebral infarction, unspecified: Secondary | ICD-10-CM | POA: Diagnosis not present

## 2022-07-12 DIAGNOSIS — R32 Unspecified urinary incontinence: Secondary | ICD-10-CM | POA: Diagnosis not present

## 2022-07-12 DIAGNOSIS — I639 Cerebral infarction, unspecified: Secondary | ICD-10-CM | POA: Diagnosis not present

## 2022-07-12 DIAGNOSIS — J449 Chronic obstructive pulmonary disease, unspecified: Secondary | ICD-10-CM | POA: Diagnosis not present

## 2022-07-13 DIAGNOSIS — I639 Cerebral infarction, unspecified: Secondary | ICD-10-CM | POA: Diagnosis not present

## 2022-07-14 DIAGNOSIS — I639 Cerebral infarction, unspecified: Secondary | ICD-10-CM | POA: Diagnosis not present

## 2022-07-15 DIAGNOSIS — I639 Cerebral infarction, unspecified: Secondary | ICD-10-CM | POA: Diagnosis not present

## 2022-07-16 DIAGNOSIS — I639 Cerebral infarction, unspecified: Secondary | ICD-10-CM | POA: Diagnosis not present

## 2022-07-17 DIAGNOSIS — I639 Cerebral infarction, unspecified: Secondary | ICD-10-CM | POA: Diagnosis not present

## 2022-07-18 DIAGNOSIS — I639 Cerebral infarction, unspecified: Secondary | ICD-10-CM | POA: Diagnosis not present

## 2022-07-19 DIAGNOSIS — I639 Cerebral infarction, unspecified: Secondary | ICD-10-CM | POA: Diagnosis not present

## 2022-07-20 ENCOUNTER — Other Ambulatory Visit (HOSPITAL_COMMUNITY): Payer: Self-pay | Admitting: Family Medicine

## 2022-07-20 ENCOUNTER — Ambulatory Visit (HOSPITAL_COMMUNITY)
Admission: RE | Admit: 2022-07-20 | Discharge: 2022-07-20 | Disposition: A | Discharge status: Home / Self Care | Payer: Medicaid Other | Source: Ambulatory Visit | Attending: Family Medicine | Admitting: Family Medicine

## 2022-07-20 ENCOUNTER — Encounter: Payer: Self-pay | Admitting: Urology

## 2022-07-20 DIAGNOSIS — K838 Other specified diseases of biliary tract: Secondary | ICD-10-CM | POA: Diagnosis not present

## 2022-07-20 DIAGNOSIS — R109 Unspecified abdominal pain: Secondary | ICD-10-CM | POA: Diagnosis not present

## 2022-07-20 DIAGNOSIS — I639 Cerebral infarction, unspecified: Secondary | ICD-10-CM | POA: Diagnosis not present

## 2022-07-20 DIAGNOSIS — K8020 Calculus of gallbladder without cholecystitis without obstruction: Secondary | ICD-10-CM | POA: Diagnosis not present

## 2022-07-20 MED ORDER — GADOBUTROL 1 MMOL/ML IV SOLN
8.5000 mL | Freq: Once | INTRAVENOUS | Status: AC | PRN
  Administered 2022-07-20: 8.5 mL via INTRAVENOUS

## 2022-07-21 DIAGNOSIS — I639 Cerebral infarction, unspecified: Secondary | ICD-10-CM | POA: Diagnosis not present

## 2022-07-22 DIAGNOSIS — I639 Cerebral infarction, unspecified: Secondary | ICD-10-CM | POA: Diagnosis not present

## 2022-07-23 DIAGNOSIS — I639 Cerebral infarction, unspecified: Secondary | ICD-10-CM | POA: Diagnosis not present

## 2022-07-24 DIAGNOSIS — I639 Cerebral infarction, unspecified: Secondary | ICD-10-CM | POA: Diagnosis not present

## 2022-07-25 DIAGNOSIS — I639 Cerebral infarction, unspecified: Secondary | ICD-10-CM | POA: Diagnosis not present

## 2022-07-26 DIAGNOSIS — I639 Cerebral infarction, unspecified: Secondary | ICD-10-CM | POA: Diagnosis not present

## 2022-07-27 ENCOUNTER — Ambulatory Visit: Payer: Medicaid Other | Admitting: Urology

## 2022-07-27 DIAGNOSIS — I639 Cerebral infarction, unspecified: Secondary | ICD-10-CM | POA: Diagnosis not present

## 2022-07-28 ENCOUNTER — Telehealth: Payer: Self-pay | Admitting: Family Medicine

## 2022-07-28 ENCOUNTER — Encounter: Payer: Self-pay | Admitting: Urology

## 2022-07-28 DIAGNOSIS — I639 Cerebral infarction, unspecified: Secondary | ICD-10-CM | POA: Diagnosis not present

## 2022-07-28 NOTE — Telephone Encounter (Signed)
Patient said she received a message from [facility] about a screening for colon cancer but when she reached back out to them she said they told her that her PCP would have to order before they would be able to send the kit to her. Stated that she has spoke to PCP about this before.

## 2022-07-28 NOTE — Telephone Encounter (Signed)
Patient would like an order for cologuard ordered for colon cancer screeing

## 2022-07-29 ENCOUNTER — Ambulatory Visit: Payer: Medicaid Other | Admitting: Urology

## 2022-07-29 ENCOUNTER — Other Ambulatory Visit: Payer: Self-pay | Admitting: Family Medicine

## 2022-07-29 DIAGNOSIS — I639 Cerebral infarction, unspecified: Secondary | ICD-10-CM | POA: Diagnosis not present

## 2022-07-29 DIAGNOSIS — Z1211 Encounter for screening for malignant neoplasm of colon: Secondary | ICD-10-CM

## 2022-07-29 NOTE — Telephone Encounter (Signed)
Ordered

## 2022-07-30 DIAGNOSIS — I639 Cerebral infarction, unspecified: Secondary | ICD-10-CM | POA: Diagnosis not present

## 2022-07-31 DIAGNOSIS — I639 Cerebral infarction, unspecified: Secondary | ICD-10-CM | POA: Diagnosis not present

## 2022-08-01 DIAGNOSIS — I639 Cerebral infarction, unspecified: Secondary | ICD-10-CM | POA: Diagnosis not present

## 2022-08-02 DIAGNOSIS — I639 Cerebral infarction, unspecified: Secondary | ICD-10-CM | POA: Diagnosis not present

## 2022-08-03 DIAGNOSIS — I639 Cerebral infarction, unspecified: Secondary | ICD-10-CM | POA: Diagnosis not present

## 2022-08-04 DIAGNOSIS — I639 Cerebral infarction, unspecified: Secondary | ICD-10-CM | POA: Diagnosis not present

## 2022-08-05 DIAGNOSIS — I639 Cerebral infarction, unspecified: Secondary | ICD-10-CM | POA: Diagnosis not present

## 2022-08-06 DIAGNOSIS — I639 Cerebral infarction, unspecified: Secondary | ICD-10-CM | POA: Diagnosis not present

## 2022-08-07 DIAGNOSIS — I639 Cerebral infarction, unspecified: Secondary | ICD-10-CM | POA: Diagnosis not present

## 2022-08-08 DIAGNOSIS — I639 Cerebral infarction, unspecified: Secondary | ICD-10-CM | POA: Diagnosis not present

## 2022-08-09 DIAGNOSIS — I639 Cerebral infarction, unspecified: Secondary | ICD-10-CM | POA: Diagnosis not present

## 2022-08-10 DIAGNOSIS — I639 Cerebral infarction, unspecified: Secondary | ICD-10-CM | POA: Diagnosis not present

## 2022-08-12 ENCOUNTER — Encounter: Payer: Self-pay | Admitting: Urology

## 2022-08-12 ENCOUNTER — Ambulatory Visit (INDEPENDENT_AMBULATORY_CARE_PROVIDER_SITE_OTHER): Payer: Medicaid Other | Admitting: Urology

## 2022-08-12 VITALS — BP 114/72 | HR 65 | Ht 72.0 in | Wt 192.0 lb

## 2022-08-12 DIAGNOSIS — N3941 Urge incontinence: Secondary | ICD-10-CM

## 2022-08-12 DIAGNOSIS — Z87440 Personal history of urinary (tract) infections: Secondary | ICD-10-CM

## 2022-08-12 DIAGNOSIS — I639 Cerebral infarction, unspecified: Secondary | ICD-10-CM | POA: Diagnosis not present

## 2022-08-12 DIAGNOSIS — R3915 Urgency of urination: Secondary | ICD-10-CM

## 2022-08-12 DIAGNOSIS — N390 Urinary tract infection, site not specified: Secondary | ICD-10-CM

## 2022-08-12 LAB — URINALYSIS, ROUTINE W REFLEX MICROSCOPIC
Bilirubin, UA: NEGATIVE
Glucose, UA: NEGATIVE
Ketones, UA: NEGATIVE
Nitrite, UA: NEGATIVE
Protein,UA: NEGATIVE
RBC, UA: NEGATIVE
Specific Gravity, UA: 1.015 (ref 1.005–1.030)
Urobilinogen, Ur: 0.2 mg/dL (ref 0.2–1.0)
pH, UA: 7 (ref 5.0–7.5)

## 2022-08-12 LAB — MICROSCOPIC EXAMINATION
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
## 2022-08-13 DIAGNOSIS — I639 Cerebral infarction, unspecified: Secondary | ICD-10-CM | POA: Diagnosis not present

## 2022-08-14 DIAGNOSIS — I639 Cerebral infarction, unspecified: Secondary | ICD-10-CM | POA: Diagnosis not present

## 2022-08-16 ENCOUNTER — Encounter: Payer: Self-pay | Admitting: Family Medicine

## 2022-08-16 DIAGNOSIS — Z1211 Encounter for screening for malignant neoplasm of colon: Secondary | ICD-10-CM | POA: Diagnosis not present

## 2022-08-16 DIAGNOSIS — Z1212 Encounter for screening for malignant neoplasm of rectum: Secondary | ICD-10-CM | POA: Diagnosis not present

## 2022-08-16 DIAGNOSIS — I639 Cerebral infarction, unspecified: Secondary | ICD-10-CM | POA: Diagnosis not present

## 2022-08-17 DIAGNOSIS — I639 Cerebral infarction, unspecified: Secondary | ICD-10-CM | POA: Diagnosis not present

## 2022-08-17 NOTE — Addendum Note (Signed)
Addended by: Sonny Masters on: 08/17/2022 12:59 PM   Modules accepted: Orders

## 2022-08-18 DIAGNOSIS — I639 Cerebral infarction, unspecified: Secondary | ICD-10-CM | POA: Diagnosis not present

## 2022-08-19 DIAGNOSIS — I639 Cerebral infarction, unspecified: Secondary | ICD-10-CM | POA: Diagnosis not present

## 2022-08-20 DIAGNOSIS — I639 Cerebral infarction, unspecified: Secondary | ICD-10-CM | POA: Diagnosis not present

## 2022-08-21 DIAGNOSIS — I639 Cerebral infarction, unspecified: Secondary | ICD-10-CM | POA: Diagnosis not present

## 2022-08-22 DIAGNOSIS — I639 Cerebral infarction, unspecified: Secondary | ICD-10-CM | POA: Diagnosis not present

## 2022-08-23 DIAGNOSIS — I639 Cerebral infarction, unspecified: Secondary | ICD-10-CM | POA: Diagnosis not present

## 2022-08-24 DIAGNOSIS — I639 Cerebral infarction, unspecified: Secondary | ICD-10-CM | POA: Diagnosis not present

## 2022-08-25 DIAGNOSIS — I639 Cerebral infarction, unspecified: Secondary | ICD-10-CM | POA: Diagnosis not present

## 2022-08-26 DIAGNOSIS — I639 Cerebral infarction, unspecified: Secondary | ICD-10-CM | POA: Diagnosis not present

## 2022-08-27 ENCOUNTER — Encounter (INDEPENDENT_AMBULATORY_CARE_PROVIDER_SITE_OTHER): Payer: Self-pay

## 2022-08-27 ENCOUNTER — Ambulatory Visit (INDEPENDENT_AMBULATORY_CARE_PROVIDER_SITE_OTHER): Payer: Medicaid Other | Admitting: Gastroenterology

## 2022-08-27 ENCOUNTER — Encounter (INDEPENDENT_AMBULATORY_CARE_PROVIDER_SITE_OTHER): Payer: Self-pay | Admitting: Gastroenterology

## 2022-08-27 VITALS — BP 123/79 | HR 67 | Temp 97.8°F | Ht 72.0 in | Wt 191.1 lb

## 2022-08-27 DIAGNOSIS — K219 Gastro-esophageal reflux disease without esophagitis: Secondary | ICD-10-CM | POA: Diagnosis not present

## 2022-08-27 DIAGNOSIS — K5904 Chronic idiopathic constipation: Secondary | ICD-10-CM | POA: Diagnosis not present

## 2022-08-27 DIAGNOSIS — I639 Cerebral infarction, unspecified: Secondary | ICD-10-CM | POA: Diagnosis not present

## 2022-08-27 DIAGNOSIS — K838 Other specified diseases of biliary tract: Secondary | ICD-10-CM

## 2022-08-27 MED ORDER — PSYLLIUM 58.6 % PO PACK: 1.0000 | PACK | Freq: Two times a day (BID) | ORAL | 2 refills | Status: AC

## 2022-08-27 NOTE — Patient Instructions (Signed)
It was very nice to meet you today, as dicussed with will plan for the following :  1) MRCP  2) Ensure adequate fluid intake: Aim for 8 glasses of water daily. Follow a high fiber diet: Include foods such as dates, prunes, pears, and kiwi. Take Miralax twice a day for the first week, then reduce to once daily thereafter. Use Metamucil twice a day.
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
## 2022-08-28 DIAGNOSIS — I639 Cerebral infarction, unspecified: Secondary | ICD-10-CM | POA: Diagnosis not present

## 2022-08-29 DIAGNOSIS — I639 Cerebral infarction, unspecified: Secondary | ICD-10-CM | POA: Diagnosis not present

## 2022-08-30 DIAGNOSIS — J449 Chronic obstructive pulmonary disease, unspecified: Secondary | ICD-10-CM | POA: Diagnosis not present

## 2022-08-30 DIAGNOSIS — I639 Cerebral infarction, unspecified: Secondary | ICD-10-CM | POA: Diagnosis not present

## 2022-08-30 DIAGNOSIS — R32 Unspecified urinary incontinence: Secondary | ICD-10-CM | POA: Diagnosis not present

## 2022-08-31 ENCOUNTER — Encounter (INDEPENDENT_AMBULATORY_CARE_PROVIDER_SITE_OTHER): Payer: Self-pay | Admitting: Gastroenterology

## 2022-08-31 DIAGNOSIS — I639 Cerebral infarction, unspecified: Secondary | ICD-10-CM | POA: Diagnosis not present

## 2022-09-01 DIAGNOSIS — I639 Cerebral infarction, unspecified: Secondary | ICD-10-CM | POA: Diagnosis not present

## 2022-09-02 DIAGNOSIS — I639 Cerebral infarction, unspecified: Secondary | ICD-10-CM | POA: Diagnosis not present

## 2022-09-03 DIAGNOSIS — I639 Cerebral infarction, unspecified: Secondary | ICD-10-CM | POA: Diagnosis not present

## 2022-09-04 DIAGNOSIS — I639 Cerebral infarction, unspecified: Secondary | ICD-10-CM | POA: Diagnosis not present

## 2022-09-05 DIAGNOSIS — I639 Cerebral infarction, unspecified: Secondary | ICD-10-CM | POA: Diagnosis not present

## 2022-09-06 ENCOUNTER — Telehealth (INDEPENDENT_AMBULATORY_CARE_PROVIDER_SITE_OTHER): Payer: Self-pay | Admitting: Gastroenterology

## 2022-09-06 DIAGNOSIS — I639 Cerebral infarction, unspecified: Secondary | ICD-10-CM | POA: Diagnosis not present

## 2022-09-06 NOTE — Telephone Encounter (Signed)
Melissa from pre service called in and states that the CPT code they have for the MRCP is different from what was authorized. I only have one CPT code for MRCP. Left detailed message on Melissa voicemail letting her know.

## 2022-09-07 ENCOUNTER — Other Ambulatory Visit (INDEPENDENT_AMBULATORY_CARE_PROVIDER_SITE_OTHER): Payer: Self-pay | Admitting: Gastroenterology

## 2022-09-07 DIAGNOSIS — I639 Cerebral infarction, unspecified: Secondary | ICD-10-CM | POA: Diagnosis not present

## 2022-09-07 DIAGNOSIS — K838 Other specified diseases of biliary tract: Secondary | ICD-10-CM

## 2022-09-08 DIAGNOSIS — I639 Cerebral infarction, unspecified: Secondary | ICD-10-CM | POA: Diagnosis not present

## 2022-09-09 ENCOUNTER — Ambulatory Visit (HOSPITAL_COMMUNITY)
Admission: RE | Admit: 2022-09-09 | Discharge: 2022-09-09 | Disposition: A | Discharge status: Home / Self Care | Payer: Medicaid Other | Source: Ambulatory Visit | Attending: Gastroenterology | Admitting: Gastroenterology

## 2022-09-09 ENCOUNTER — Other Ambulatory Visit (INDEPENDENT_AMBULATORY_CARE_PROVIDER_SITE_OTHER): Payer: Self-pay | Admitting: Gastroenterology

## 2022-09-09 DIAGNOSIS — I639 Cerebral infarction, unspecified: Secondary | ICD-10-CM | POA: Diagnosis not present

## 2022-09-09 DIAGNOSIS — M47816 Spondylosis without myelopathy or radiculopathy, lumbar region: Secondary | ICD-10-CM | POA: Diagnosis not present

## 2022-09-09 DIAGNOSIS — K8020 Calculus of gallbladder without cholecystitis without obstruction: Secondary | ICD-10-CM | POA: Diagnosis not present

## 2022-09-09 DIAGNOSIS — K838 Other specified diseases of biliary tract: Secondary | ICD-10-CM | POA: Diagnosis not present

## 2022-09-09 MED ORDER — GADOBUTROL 1 MMOL/ML IV SOLN
10.0000 mL | Freq: Once | INTRAVENOUS | Status: AC | PRN
  Administered 2022-09-09: 10 mL via INTRAVENOUS

## 2022-09-10 DIAGNOSIS — I639 Cerebral infarction, unspecified: Secondary | ICD-10-CM | POA: Diagnosis not present

## 2022-09-12 DIAGNOSIS — I639 Cerebral infarction, unspecified: Secondary | ICD-10-CM | POA: Diagnosis not present

## 2022-09-13 DIAGNOSIS — I639 Cerebral infarction, unspecified: Secondary | ICD-10-CM | POA: Diagnosis not present

## 2022-09-14 DIAGNOSIS — I639 Cerebral infarction, unspecified: Secondary | ICD-10-CM | POA: Diagnosis not present

## 2022-09-14 NOTE — Progress Notes (Signed)
Hi Alice [name] ,  Can you please call the patient and tell the patient the MRCP shows possible gallstone in the bile duct which would explain the CBD dilation seen in the MRI . I recommend this to be removed as it can lead to inflammation and infection for bile ducts/gallbladder  Advised patient if any severe abdominal pain, nausea vomiting or fever or chills then at that time come to the ER urgently.  For now patient will be referred for ERCP  Ann: Can the patient be referred to Dr Meridee Score for ERCP?  Thanks,  Vista Lawman, MD Gastroenterology and Hepatology [facility] Gastroenterology

## 2022-09-15 DIAGNOSIS — I639 Cerebral infarction, unspecified: Secondary | ICD-10-CM | POA: Diagnosis not present

## 2022-09-16 DIAGNOSIS — I639 Cerebral infarction, unspecified: Secondary | ICD-10-CM | POA: Diagnosis not present

## 2022-09-17 ENCOUNTER — Telehealth: Payer: Self-pay

## 2022-09-17 DIAGNOSIS — I639 Cerebral infarction, unspecified: Secondary | ICD-10-CM | POA: Diagnosis not present

## 2022-09-17 NOTE — Telephone Encounter (Signed)
Hi Ann please refer patient to Dr Meridee Score for ERCP

## 2022-09-17 NOTE — Telephone Encounter (Signed)
Hey Dr C, please see Dr Elesa Hacker note about timing of ERCP and let Patty know if good or not. Thanks

## 2022-09-17 NOTE — Telephone Encounter (Signed)
-----   Message from [facility] sent at 09/17/2022  5:49 AM EDT ----- Alexia Freestone, Please offer this patient my next available ERCP slot on October 14 or October 17 or October 28. Certainly if the patient develops jaundice or abnormal LFTs the patient may end up needing to come into the hospital. This is my next available and none of my ERCP colleagues have any availability either at this time due to the anesthesia shortage in [place]. If referring team feels this is too far out, then the patient will need to be referred outside of the system. Thanks. GM ----- Message ----- From: Loretha Stapler, RN Sent: 09/15/2022   8:50 AM EDT To: Larose Kells., MD   ----- Message ----- From: Simone Curia Sent: 09/15/2022   8:48 AM EDT To: Loretha Stapler, RN  Good morning Lilit Cinelli. Dr Tasia Catchings would like for  this patient to have ERCP by Dr Meridee Score. Thanks Dewayne Hatch

## 2022-09-17 NOTE — Telephone Encounter (Signed)
-----   Message from Franky Macho sent at 09/17/2022  4:17 PM EDT ----- Hi Dr Meridee Score   Appreciate for getting this patient in for ERCP  Mamie Laurel ----- Message ----- From: Lemar Lofty., MD Sent: 09/17/2022   5:51 AM EDT To: Loretha Stapler, RN; Simone Curia; #  Jesus Nevills, Please offer this patient my next available ERCP slot on October 14 or October 17 or October 28. Certainly if the patient develops jaundice or abnormal LFTs the patient may end up needing to come into the hospital. This is my next available and none of my ERCP colleagues have any availability either at this time due to the anesthesia shortage in [place]. If referring team feels this is too far out, then the patient will need to be referred outside of the system. Thanks. GM ----- Message ----- From: Loretha Stapler, RN Sent: 09/15/2022   8:50 AM EDT To: Larose Kells., MD   ----- Message ----- From: Simone Curia Sent: 09/15/2022   8:48 AM EDT To: Loretha Stapler, RN  Good morning Malary Aylesworth. Dr Tasia Catchings would like for  this patient to have ERCP by Dr Meridee Score. Thanks Dewayne Hatch

## 2022-09-17 NOTE — Telephone Encounter (Signed)
Ann see the message from Dr Meridee Score regarding timing of the ERCP. Let us know if this it too far out and prefer to refer elsewhere.

## 2022-09-17 NOTE — Telephone Encounter (Signed)
Forwarding to Dr. Tasia Catchings to decide if ERCP can wait of if needs to be referred elsewhere

## 2022-09-18 DIAGNOSIS — I639 Cerebral infarction, unspecified: Secondary | ICD-10-CM | POA: Diagnosis not present

## 2022-09-19 DIAGNOSIS — I639 Cerebral infarction, unspecified: Secondary | ICD-10-CM | POA: Diagnosis not present

## 2022-09-20 DIAGNOSIS — I639 Cerebral infarction, unspecified: Secondary | ICD-10-CM | POA: Diagnosis not present

## 2022-09-21 ENCOUNTER — Other Ambulatory Visit: Payer: Self-pay

## 2022-09-21 DIAGNOSIS — K838 Other specified diseases of biliary tract: Secondary | ICD-10-CM

## 2022-09-21 DIAGNOSIS — I639 Cerebral infarction, unspecified: Secondary | ICD-10-CM | POA: Diagnosis not present

## 2022-09-21 NOTE — Telephone Encounter (Signed)
ERCP has been scheduled for 10/25/22 at [facility] with GM at 130 pm.

## 2022-09-21 NOTE — Telephone Encounter (Signed)
ERCP scheduled, pt instructed and medications reviewed.  Patient instructions mailed to home.  Patient to call with any questions or concerns.  

## 2022-09-22 DIAGNOSIS — I639 Cerebral infarction, unspecified: Secondary | ICD-10-CM | POA: Diagnosis not present

## 2022-09-23 DIAGNOSIS — I639 Cerebral infarction, unspecified: Secondary | ICD-10-CM | POA: Diagnosis not present

## 2022-09-24 DIAGNOSIS — I639 Cerebral infarction, unspecified: Secondary | ICD-10-CM | POA: Diagnosis not present

## 2022-09-25 DIAGNOSIS — I639 Cerebral infarction, unspecified: Secondary | ICD-10-CM | POA: Diagnosis not present

## 2022-09-26 DIAGNOSIS — I639 Cerebral infarction, unspecified: Secondary | ICD-10-CM | POA: Diagnosis not present

## 2022-09-27 DIAGNOSIS — I639 Cerebral infarction, unspecified: Secondary | ICD-10-CM | POA: Diagnosis not present

## 2022-09-28 DIAGNOSIS — I639 Cerebral infarction, unspecified: Secondary | ICD-10-CM | POA: Diagnosis not present

## 2022-09-29 DIAGNOSIS — I639 Cerebral infarction, unspecified: Secondary | ICD-10-CM | POA: Diagnosis not present

## 2022-09-30 DIAGNOSIS — I639 Cerebral infarction, unspecified: Secondary | ICD-10-CM | POA: Diagnosis not present

## 2022-10-01 DIAGNOSIS — I639 Cerebral infarction, unspecified: Secondary | ICD-10-CM | POA: Diagnosis not present

## 2022-10-02 DIAGNOSIS — I639 Cerebral infarction, unspecified: Secondary | ICD-10-CM | POA: Diagnosis not present

## 2022-10-03 DIAGNOSIS — I639 Cerebral infarction, unspecified: Secondary | ICD-10-CM | POA: Diagnosis not present

## 2022-10-04 DIAGNOSIS — I639 Cerebral infarction, unspecified: Secondary | ICD-10-CM | POA: Diagnosis not present

## 2022-10-05 DIAGNOSIS — I639 Cerebral infarction, unspecified: Secondary | ICD-10-CM | POA: Diagnosis not present

## 2022-10-06 DIAGNOSIS — I639 Cerebral infarction, unspecified: Secondary | ICD-10-CM | POA: Diagnosis not present

## 2022-10-07 DIAGNOSIS — I639 Cerebral infarction, unspecified: Secondary | ICD-10-CM | POA: Diagnosis not present

## 2022-10-08 ENCOUNTER — Encounter: Payer: Self-pay | Admitting: Internal Medicine

## 2022-10-08 ENCOUNTER — Ambulatory Visit (INDEPENDENT_AMBULATORY_CARE_PROVIDER_SITE_OTHER): Payer: Medicaid Other | Admitting: Internal Medicine

## 2022-10-08 VITALS — BP 158/94 | HR 75 | Ht 72.0 in | Wt 195.0 lb

## 2022-10-08 DIAGNOSIS — F17210 Nicotine dependence, cigarettes, uncomplicated: Secondary | ICD-10-CM | POA: Diagnosis not present

## 2022-10-08 DIAGNOSIS — J449 Chronic obstructive pulmonary disease, unspecified: Secondary | ICD-10-CM | POA: Diagnosis not present

## 2022-10-08 DIAGNOSIS — I639 Cerebral infarction, unspecified: Secondary | ICD-10-CM | POA: Diagnosis not present

## 2022-10-08 NOTE — Assessment & Plan Note (Signed)
Active smoker with main concern cough as of 01/14/22  - 01/14/22 d/c acei > cough resolved then recurred on macrodantin > resolved again  - 10/08/2022   Walked on RA  x  2  lap(s) =  approx 300  ft  @  back pain slow  pace, stopped due to back pain with lowest 02 sats 97%      COPD ? Severity but As I explained to this patient in detail:  although there may be copd present, it may not be clinically relevant:   it does not appear to be limiting activity tolerance    this pt is so sedentary I don't recommend aggressive pulmonary rx at this point unless limiting symptoms arise or acute exacerbations become as issue, neither of which is the case now.  I asked the patient to contact this office at any time in the future should either of these problems arise.    F/u pfts scheduled for baseline

## 2022-10-08 NOTE — Patient Instructions (Signed)
My office will be contacting you by phone for referral to lung cancer screening and PFTs   - if you don't hear back from my office within one week please call us back or notify us thru MyChart and we'll address it right away   The key is to stop smoking completely before smoking completely stops you!    Please schedule a follow up visit in 6 months but call sooner if needed

## 2022-10-08 NOTE — Progress Notes (Addendum)
Alice [name], female    DOB: [birth year]    MRN: [id number]   Brief patient profile:  55 yowf  active smoker  referred to pulmonary clinic in [place]  01/14/2022 by Gilford Silvius  for copd eval     History of Present Illness  01/14/2022  Pulmonary/ 1st office eval/ Latrease Kunde / [facility] maint on Anoro /ACEi / low dose ppi  Chief Complaint  Patient presents with   Consult    SOB/ COPD   Dyspnea:  baseline (oct 2023) still needing neb 3 x daily  Still house work  / doe across the house front door to kitchen ride scooter to shop / walks bent over walking   Cough: nonp productive day > noct (as long as sleeps at 45 degrees) even when better still coughing but much worse since Nov 2023 with bilateral sym lat cp just with coughing fits   Sleep: much worse when try to sleep flat so using recliner x 45 degrees  SABA use: neb 3 x daily  02: none  Vax never vax/ once infected   Rec Pantoprazole (protonix) 40 mg   Take  30-60 min before first meal of the day and add  Pepcid (famotidine)  20 mg after supper until return to office - this is the best way to tell whether stomach acid is contributing to your problem.   Stop all smoking Stop lisinopil and anoro  Just use your nebulizer up every 4 hours as needed for cough or breathing problems  Diovan 160 (valsartan) one daily  -  ok to break in half if too strong Gabapentin 100 mg increased to 4 x daily  For cough > mucinex dm 1200 mg every 12 hours and avoid mint menthol chocolate  Return with all  inhalers/ solutions in hand       07/09/2022  f/u ov/[place] office/Climmie Cronce re: ? Acei case maint on did not  bring meds  Chief Complaint  Patient presents with   Follow-up  Dyspnea:  rollator  limited by back x 7 min  Cough: non productive, has not tried mucinex dm yet Sleeping: in bed 45 degrees - no longer in recliner  SABA use: already used 4 h prior to ov 02: none   Rec My office will be contacting you by phone for referral for lung cancer  screening  > did not do as of 10/08/2022  Take two of gabapentin in evening should help with cough and can adjust upward with your pain doctor 07/12/22 if needed For cough/ congestion >  mucinex dm  up to maximum of  1200 mg every 12 hours as needed    The key is to stop smoking completely before smoking completely stops you!  Please schedule a follow up visit in 3 months but call sooner if needed with pfts on return   10/08/2022  f/u ov/ office/Hurshel Bouillon re: GOLD ? / still smoking  maint on prn neb   Chief Complaint  Patient presents with   Upper airway cough syndrome   Dyspnea: using  rollator due to back pain  / mailbox and return  ok  50 ft flat  Cough: none  Sleeping: 45 degrees s  resp cc  SABA use: neb every few weeks  02: none   Lung cancer screening: referred     No obvious day to day or daytime variability or assoc excess/ purulent sputum or mucus plugs or hemoptysis or cp or chest tightness, subjective wheeze or overt sinus or  hb symptoms.    Also denies any obvious fluctuation of symptoms with weather or environmental changes or other aggravating or alleviating factors except as outlined above   No unusual exposure hx or h/o childhood pna/ asthma or knowledge of premature birth.  Current Allergies, Complete Past Medical History, Past Surgical History, Family History, and Social History were reviewed in [employer] record.  ROS  The following are not active complaints unless bolded Hoarseness, sore throat, dysphagia, dental problems, itching, sneezing,  nasal congestion or discharge of excess mucus or purulent secretions, ear ache,   fever, chills, sweats, unintended wt loss or wt gain, classically pleuritic or exertional cp,  orthopnea pnd or arm/hand swelling  or leg swelling, presyncope, palpitations, abdominal pain, anorexia, nausea, vomiting, diarrhea  or change in bowel habits or change in bladder habits, change in stools or change in urine,  dysuria, hematuria,  rash, visual complaints, headache, numbness, weakness or ataxia or problems with walking or coordination,  change in mood or  memory.        Current Meds  Medication Sig   aspirin EC 81 MG tablet Take 1 tablet (81 mg total) by mouth daily with breakfast. For Stroke Prevention   atorvastatin (LIPITOR) 40 MG tablet Take 1 tablet (40 mg total) by mouth daily.   baclofen (LIORESAL) 10 MG tablet Take 1 tablet (10 mg total) by mouth 2 (two) times daily.   famotidine (PEPCID) 20 MG tablet One after supper (Patient taking differently: Take 20 mg by mouth daily. One after supper)   gabapentin (NEURONTIN) 100 MG capsule Take 1 capsule (100 mg total) by mouth 4 (four) times daily.   pantoprazole (PROTONIX) 40 MG tablet Take 1 tablet (40 mg total) by mouth daily.   polyethylene glycol powder (GLYCOLAX/MIRALAX) 17 GM/SCOOP powder Take 17 g by mouth daily.   psyllium (METAMUCIL) 58.6 % packet Take 1 packet by mouth 2 (two) times daily.   SUBOXONE 8-2 MG FILM Place 0.5 Film under the tongue every 6 (six) hours.          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   Qualifier: Diagnosis of  By: Zachary George     Chronic low back pain    Cocaine abuse (HCC)    COPD (chronic obstructive pulmonary disease) (HCC)    Heart murmur    HLD (hyperlipidemia)    Hypertension    Stroke (HCC)    x4     Objective:    Wts   10/08/2022       195  07/09/22 189 lb 3.2 oz (85.8 kg)  06/23/22 197 lb (89.4 kg)  06/15/22 196 lb (88.9 kg)    Vital signs reviewed  10/08/2022  - Note at rest 02 sats  97% on RA   General appearance:    amb (with rolator) wf > stated age in appearance     HEENT : Oropharynx  clear/edentulous    Nasal turbinates nl    NECK :  without  apparent JVD/ palpable Nodes/TM    LUNGS: no acc muscle use,  Min barrel  contour chest wall with bilateral  slightly decreased bs s audible wheeze and  without cough on insp or exp maneuvers and min   Hyperresonant  to  percussion bilaterally    CV:  RRR  no s3  with 2/6 sem s  increase in P2, and no edema   ABD:  soft and nontender with pos end  insp Hoover's  in the supine position.  No bruits or organomegaly appreciated   MS:  Nl gait/ ext warm without deformities Or obvious joint restrictions  calf tenderness, cyanosis or clubbing     SKIN: warm and dry without lesions    NEURO:  alert, approp, nl sensorium with  no motor or cerebellar deficits apparent.                    I personally reviewed images and agree with radiology impression as follows:   Chest CTa    07/18/21  1. No evidence of pulmonary embolus. 2. Small area of atelectasis versus airspace consolidation in the lateral aspect of the lingula. 3. Possible mild interstitial pulmonary edema. 4. Moderate in size hiatal hernia with diffuse dilation of the esophagus. 5. Chronic thoracic spine fractures.     Assessment

## 2022-10-09 DIAGNOSIS — I639 Cerebral infarction, unspecified: Secondary | ICD-10-CM | POA: Diagnosis not present

## 2022-10-10 DIAGNOSIS — I639 Cerebral infarction, unspecified: Secondary | ICD-10-CM | POA: Diagnosis not present

## 2022-10-11 DIAGNOSIS — I639 Cerebral infarction, unspecified: Secondary | ICD-10-CM | POA: Diagnosis not present

## 2022-10-12 DIAGNOSIS — I639 Cerebral infarction, unspecified: Secondary | ICD-10-CM | POA: Diagnosis not present

## 2022-10-13 DIAGNOSIS — I639 Cerebral infarction, unspecified: Secondary | ICD-10-CM | POA: Diagnosis not present

## 2022-10-14 DIAGNOSIS — I639 Cerebral infarction, unspecified: Secondary | ICD-10-CM | POA: Diagnosis not present

## 2022-10-15 DIAGNOSIS — I639 Cerebral infarction, unspecified: Secondary | ICD-10-CM | POA: Diagnosis not present

## 2022-10-16 DIAGNOSIS — I639 Cerebral infarction, unspecified: Secondary | ICD-10-CM | POA: Diagnosis not present

## 2022-10-17 DIAGNOSIS — I639 Cerebral infarction, unspecified: Secondary | ICD-10-CM | POA: Diagnosis not present

## 2022-10-18 ENCOUNTER — Other Ambulatory Visit: Payer: Self-pay | Admitting: Family Medicine

## 2022-10-18 DIAGNOSIS — J449 Chronic obstructive pulmonary disease, unspecified: Secondary | ICD-10-CM | POA: Diagnosis not present

## 2022-10-18 DIAGNOSIS — K219 Gastro-esophageal reflux disease without esophagitis: Secondary | ICD-10-CM

## 2022-10-18 DIAGNOSIS — R32 Unspecified urinary incontinence: Secondary | ICD-10-CM | POA: Diagnosis not present

## 2022-10-18 DIAGNOSIS — I639 Cerebral infarction, unspecified: Secondary | ICD-10-CM | POA: Diagnosis not present

## 2022-10-19 ENCOUNTER — Encounter (HOSPITAL_COMMUNITY): Payer: Self-pay | Admitting: Gastroenterology

## 2022-10-19 DIAGNOSIS — I639 Cerebral infarction, unspecified: Secondary | ICD-10-CM | POA: Diagnosis not present

## 2022-10-19 NOTE — Progress Notes (Signed)
Pre op call eval Name:Maurice Seward Grater Rakes FNP Cardiologist-n/a  Pulmonologist- Dr Sherene Sires  EKG-02/28/22 Echo-01/17/22 Cath-n/a Stress-n/a ICD/PM-n/a Blood thinner-n/a GLP-1- n/a  WU:[id number], HTN, Stroke, COPD, substance abuse. See's pulmonary last office visit 10/08/22 says shes to f/u in 6 months and get her PFTs per schedule. I asked pt about her breathing status she said really good right now, basically off all nebulizers, feels her breathing is good. Does use walker, not new, doesnt feel like her mobility has changed recently. History of mumur, all recent notes from different providers say no murmur noted. Did see neurology for past strokes, last seen 03/18/22, said okay to f/u as needed.   Anesthesia Review: Yes

## 2022-10-20 DIAGNOSIS — I639 Cerebral infarction, unspecified: Secondary | ICD-10-CM | POA: Diagnosis not present

## 2022-10-21 DIAGNOSIS — I639 Cerebral infarction, unspecified: Secondary | ICD-10-CM | POA: Diagnosis not present

## 2022-10-22 DIAGNOSIS — I639 Cerebral infarction, unspecified: Secondary | ICD-10-CM | POA: Diagnosis not present

## 2022-10-23 DIAGNOSIS — I639 Cerebral infarction, unspecified: Secondary | ICD-10-CM | POA: Diagnosis not present

## 2022-10-24 DIAGNOSIS — I639 Cerebral infarction, unspecified: Secondary | ICD-10-CM | POA: Diagnosis not present

## 2022-10-25 ENCOUNTER — Other Ambulatory Visit: Payer: Self-pay

## 2022-10-25 ENCOUNTER — Encounter (HOSPITAL_COMMUNITY)
Admission: RE | Disposition: A | Discharge status: Home / Self Care | Payer: Self-pay | Source: Home / Self Care | Attending: Gastroenterology

## 2022-10-25 ENCOUNTER — Ambulatory Visit (HOSPITAL_COMMUNITY): Payer: Medicaid Other | Admitting: Anesthesiology

## 2022-10-25 ENCOUNTER — Telehealth: Payer: Self-pay

## 2022-10-25 ENCOUNTER — Ambulatory Visit (HOSPITAL_BASED_OUTPATIENT_CLINIC_OR_DEPARTMENT_OTHER): Payer: Medicaid Other | Admitting: Anesthesiology

## 2022-10-25 ENCOUNTER — Encounter (HOSPITAL_COMMUNITY): Payer: Self-pay | Admitting: Gastroenterology

## 2022-10-25 ENCOUNTER — Ambulatory Visit (HOSPITAL_COMMUNITY): Payer: Medicaid Other

## 2022-10-25 ENCOUNTER — Telehealth: Payer: Self-pay | Admitting: Gastroenterology

## 2022-10-25 ENCOUNTER — Ambulatory Visit (HOSPITAL_COMMUNITY)
Admission: RE | Admit: 2022-10-25 | Discharge: 2022-10-25 | Disposition: A | Discharge status: Home / Self Care | Payer: Medicaid Other | Attending: Gastroenterology | Admitting: Gastroenterology

## 2022-10-25 DIAGNOSIS — J449 Chronic obstructive pulmonary disease, unspecified: Secondary | ICD-10-CM | POA: Insufficient documentation

## 2022-10-25 DIAGNOSIS — K8050 Calculus of bile duct without cholangitis or cholecystitis without obstruction: Secondary | ICD-10-CM

## 2022-10-25 DIAGNOSIS — G8929 Other chronic pain: Secondary | ICD-10-CM | POA: Insufficient documentation

## 2022-10-25 DIAGNOSIS — I639 Cerebral infarction, unspecified: Secondary | ICD-10-CM | POA: Diagnosis not present

## 2022-10-25 DIAGNOSIS — R932 Abnormal findings on diagnostic imaging of liver and biliary tract: Secondary | ICD-10-CM | POA: Insufficient documentation

## 2022-10-25 DIAGNOSIS — K838 Other specified diseases of biliary tract: Secondary | ICD-10-CM

## 2022-10-25 DIAGNOSIS — K2950 Unspecified chronic gastritis without bleeding: Secondary | ICD-10-CM | POA: Diagnosis not present

## 2022-10-25 DIAGNOSIS — E785 Hyperlipidemia, unspecified: Secondary | ICD-10-CM | POA: Insufficient documentation

## 2022-10-25 DIAGNOSIS — I10 Essential (primary) hypertension: Secondary | ICD-10-CM | POA: Insufficient documentation

## 2022-10-25 DIAGNOSIS — K2970 Gastritis, unspecified, without bleeding: Secondary | ICD-10-CM

## 2022-10-25 DIAGNOSIS — Z9889 Other specified postprocedural states: Secondary | ICD-10-CM

## 2022-10-25 DIAGNOSIS — T85528A Displacement of other gastrointestinal prosthetic devices, implants and grafts, initial encounter: Secondary | ICD-10-CM

## 2022-10-25 DIAGNOSIS — F17210 Nicotine dependence, cigarettes, uncomplicated: Secondary | ICD-10-CM | POA: Diagnosis not present

## 2022-10-25 DIAGNOSIS — K219 Gastro-esophageal reflux disease without esophagitis: Secondary | ICD-10-CM | POA: Diagnosis not present

## 2022-10-25 DIAGNOSIS — K3189 Other diseases of stomach and duodenum: Secondary | ICD-10-CM | POA: Diagnosis not present

## 2022-10-25 DIAGNOSIS — M545 Low back pain, unspecified: Secondary | ICD-10-CM | POA: Diagnosis not present

## 2022-10-25 HISTORY — PX: BIOPSY: SHX5522

## 2022-10-25 HISTORY — PX: ENDOSCOPIC RETROGRADE CHOLANGIOPANCREATOGRAPHY (ERCP) WITH PROPOFOL: SHX5810

## 2022-10-25 HISTORY — PX: PANCREATIC STENT PLACEMENT: SHX5539

## 2022-10-25 HISTORY — PX: SPHINCTEROTOMY: SHX5279

## 2022-10-25 HISTORY — PX: REMOVAL OF STONES: SHX5545

## 2022-10-25 SURGERY — ENDOSCOPIC RETROGRADE CHOLANGIOPANCREATOGRAPHY (ERCP) WITH PROPOFOL
Anesthesia: General

## 2022-10-25 MED ORDER — ONDANSETRON HCL 4 MG/2ML IJ SOLN
INTRAMUSCULAR | Status: DC | PRN
  Administered 2022-10-25: 4 mg via INTRAVENOUS

## 2022-10-25 MED ORDER — SODIUM CHLORIDE 0.9 % IV SOLN: INTRAVENOUS | Status: DC

## 2022-10-25 MED ORDER — ROCURONIUM BROMIDE 100 MG/10ML IV SOLN
INTRAVENOUS | Status: DC | PRN
  Administered 2022-10-25: 10 mg via INTRAVENOUS
  Administered 2022-10-25: 50 mg via INTRAVENOUS

## 2022-10-25 MED ORDER — CIPROFLOXACIN IN D5W 400 MG/200ML IV SOLN
INTRAVENOUS | Status: DC | PRN
Start: 2022-10-25 — End: 2022-10-25
  Administered 2022-10-25: 400 mg via INTRAVENOUS

## 2022-10-25 MED ORDER — CIPROFLOXACIN IN D5W 400 MG/200ML IV SOLN
INTRAVENOUS | Status: AC
  Filled 2022-10-25: qty 200

## 2022-10-25 MED ORDER — DICLOFENAC SUPPOSITORY 100 MG
RECTAL | Status: DC | PRN
  Administered 2022-10-25: 100 mg via RECTAL

## 2022-10-25 MED ORDER — PROPOFOL 10 MG/ML IV BOLUS
INTRAVENOUS | Status: DC | PRN
  Administered 2022-10-25: 200 mg via INTRAVENOUS

## 2022-10-25 MED ORDER — SUGAMMADEX SODIUM 200 MG/2ML IV SOLN
INTRAVENOUS | Status: DC | PRN
Start: 2022-10-25 — End: 2022-10-25
  Administered 2022-10-25: 200 mg via INTRAVENOUS

## 2022-10-25 MED ORDER — SODIUM CHLORIDE 0.9 % IV SOLN
INTRAVENOUS | Status: DC | PRN
  Administered 2022-10-25: 25 mL

## 2022-10-25 MED ORDER — DEXAMETHASONE SODIUM PHOSPHATE 10 MG/ML IJ SOLN
INTRAMUSCULAR | Status: DC | PRN
Start: 2022-10-25 — End: 2022-10-25
  Administered 2022-10-25: 5 mg via INTRAVENOUS

## 2022-10-25 MED ORDER — PHENYLEPHRINE 80 MCG/ML (10ML) SYRINGE FOR IV PUSH (FOR BLOOD PRESSURE SUPPORT)
PREFILLED_SYRINGE | INTRAVENOUS | Status: DC | PRN
Start: 2022-10-25 — End: 2022-10-25
  Administered 2022-10-25: 80 ug via INTRAVENOUS

## 2022-10-25 MED ORDER — DICLOFENAC SUPPOSITORY 100 MG
RECTAL | Status: AC
  Filled 2022-10-25: qty 1

## 2022-10-25 MED ORDER — MIDAZOLAM HCL 5 MG/5ML IJ SOLN
INTRAMUSCULAR | Status: DC | PRN
  Administered 2022-10-25: 2 mg via INTRAVENOUS

## 2022-10-25 MED ORDER — GLUCAGON HCL RDNA (DIAGNOSTIC) 1 MG IJ SOLR
INTRAMUSCULAR | Status: DC | PRN
Start: 2022-10-25 — End: 2022-10-25
  Administered 2022-10-25 (×5): .25 mg via INTRAVENOUS

## 2022-10-25 MED ORDER — LIDOCAINE HCL (CARDIAC) PF 100 MG/5ML IV SOSY
PREFILLED_SYRINGE | INTRAVENOUS | Status: DC | PRN
  Administered 2022-10-25: 60 mg via INTRAVENOUS

## 2022-10-25 MED ORDER — GLUCAGON HCL RDNA (DIAGNOSTIC) 1 MG IJ SOLR
INTRAMUSCULAR | Status: AC
  Filled 2022-10-25: qty 1

## 2022-10-25 MED ORDER — MIDAZOLAM HCL 2 MG/2ML IJ SOLN
INTRAMUSCULAR | Status: AC
  Filled 2022-10-25: qty 2

## 2022-10-25 NOTE — Anesthesia Procedure Notes (Signed)
Procedure Name: Intubation Date/Time: 10/25/2022 1:06 PM  Performed by: Sampson Goon, CRNAPre-anesthesia Checklist: Patient identified, Emergency Drugs available, Suction available and Patient being monitored Patient Re-evaluated:Patient Re-evaluated prior to induction Oxygen Delivery Method: Circle System Utilized Preoxygenation: Pre-oxygenation with 100% oxygen Induction Type: IV induction Ventilation: Mask ventilation without difficulty and Oral airway inserted - appropriate to patient size Laryngoscope Size: Mac and 3 Grade View: Grade I Tube type: Oral Number of attempts: 1 Airway Equipment and Method: Stylet and Oral airway Placement Confirmation: ETT inserted through vocal cords under direct vision, positive ETCO2 and breath sounds checked- equal and bilateral Secured at: 22 cm Tube secured with: Tape Dental Injury: Teeth and Oropharynx as per pre-operative assessment

## 2022-10-25 NOTE — Transfer of Care (Signed)
Immediate Anesthesia Transfer of Care Note  Patient: Alice [name]  Procedure(s) Performed: ENDOSCOPIC RETROGRADE CHOLANGIOPANCREATOGRAPHY (ERCP) WITH PROPOFOL PANCREATIC STENT PLACEMENT SPHINCTEROTOMY REMOVAL OF STONES BIOPSY  Patient Location: PACU  Anesthesia Type:General  Level of Consciousness: awake  Airway & Oxygen Therapy: Patient Spontanous Breathing  Post-op Assessment: Report given to RN  Post vital signs: Reviewed and stable  Last Vitals:  Vitals Value Taken Time  BP 138/85 10/25/22 1430  Temp 36.3 C 10/25/22 1430  Pulse 73 10/25/22 1432  Resp 13 10/25/22 1432  SpO2 95 % 10/25/22 1432  Vitals shown include unfiled device data.  Last Pain:  Vitals:   10/25/22 1430  TempSrc: Temporal  PainSc: 0-No pain         Complications: No notable events documented.

## 2022-10-25 NOTE — Telephone Encounter (Signed)
-----   Message from [facility] sent at 10/25/2022  3:52 PM EDT ----- Regarding: Please follow-up Alice [name], This patient needs a follow-up x-ray in 2 weeks to ensure that the pancreas stent is fallen out with repeat LFTs. She can get these done at [facility]. Thanks. GM  MFA, Patient did not decide whether she wants to have cholecystectomy or not.  I will defer to you to further discuss that with her to hopefully never have choledocholithiasis again. GM

## 2022-10-25 NOTE — H&P (Signed)
GASTROENTEROLOGY PROCEDURE H&P NOTE   Primary Care Physician: Sonny Masters, FNP  HPI: Alice [name] is a 57 y.o. female who presents for ERCP for abnormal MRCP and choledocholithiasis.  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   BACK SURGERY     hardware in back   cyst removed from ovary     FRACTURE SURGERY     LUMBAR SPINE   removal of BB in finger  2008   SALIVARY GLAND SURGERY     tumor reomoved from stomach     No current facility-administered medications for this encounter.   No current facility-administered medications for this encounter. Allergies  Allergen Reactions   Benadryl [Diphenhydramine Hcl] Hives   Codeine Rash   Tetracycline Itching and Rash   Family History  Problem Relation Age of Onset   Hypertension Mother    Cancer Father    CVA Neg Hx    Social History   Socioeconomic History   Marital status: Widowed    Spouse name: Not on file   Number of children: Not on file   Years of education: Not on file   Highest education level: GED or equivalent  Occupational History   Occupation: disabled  Tobacco Use   Smoking status: Every Day    Current packs/day: 0.50    Average packs/day: 0.5 packs/day for 40.0 years (20.0 ttl pk-yrs)    Types: Cigarettes   Smokeless tobacco: Never  Vaping Use   Vaping status: Never Used  Substance and Sexual Activity   Alcohol use: Not Currently    Comment: today   Drug use: Not Currently    Types: Heroin, "Crack" cocaine, Oxycodone, Cocaine    Comment: last used 05/24/20   Sexual activity: Not Currently  Other Topics Concern   Not on file  Social History Narrative    Single, 2 children; disabled.    Social Determinants of Health   Financial Resource Strain: Medium Risk (04/11/2022)   Overall Financial Resource Strain (CARDIA)    Difficulty of Paying Living Expenses: Somewhat hard  Food Insecurity: Food Insecurity Present (04/15/2022)   Hunger Vital Sign    Worried About Running Out of Food in the Last Year: Never true    Ran Out of Food in the Last Year: Sometimes true  Transportation Needs: Unmet Transportation Needs (04/11/2022)   PRAPARE - Administrator, Civil Service (Medical): Yes    Lack of Transportation (Non-Medical): Yes  Physical Activity: Insufficiently Active (04/11/2022)   Exercise Vital Sign    Days of Exercise per Week: 2 days    Minutes of Exercise per Session: 40 min  Stress: No Stress Concern Present (04/11/2022)   [employer] of Occupational Health - Occupational Stress Questionnaire    Feeling of Stress : Only a little  Social Connections: Moderately Integrated (04/11/2022)   Social Connection and Isolation Panel [NHANES]    Frequency of Communication with Friends and Family: More than three times a week    Frequency of Social Gatherings with Friends and Family: More than three times  a week    Attends Religious Services: More than 4 times per year    Active Member of Clubs or Organizations: No    Attends Banker Meetings: Not on file    Marital Status: Married  Intimate Partner Violence: Unknown (04/16/2021)   Received from [employer], [facility]   HITS    Physically Hurt: Not on file    Insult or Talk Down To: Not on file    Threaten Physical Harm: Not on file    Scream or Curse: Not on file    Physical Exam: There were no vitals filed for this visit. There is no height or weight on file to calculate BMI. GEN: NAD EYE: Sclerae anicteric ENT: MMM CV: Non-tachycardic GI: Soft, NT/ND NEURO:  Alert & Oriented x 3  Lab Results: No results for input(s): "WBC", "HGB", "HCT", "PLT" in the  last 72 hours. BMET No results for input(s): "NA", "K", "CL", "CO2", "GLUCOSE", "BUN", "CREATININE", "CALCIUM" in the last 72 hours. LFT No results for input(s): "PROT", "ALBUMIN", "AST", "ALT", "ALKPHOS", "BILITOT", "BILIDIR", "IBILI" in the last 72 hours. PT/INR No results for input(s): "LABPROT", "INR" in the last 72 hours.   Impression / Plan: This is a 57 y.o.female who presents for ERCP for abnormal MRCP and choledocholithiasis.  The risks of an ERCP were discussed at length, including but not limited to the risk of perforation, bleeding, abdominal pain, post-ERCP pancreatitis (while usually mild can be severe and even life threatening).  The risks and benefits of endoscopic evaluation/treatment were discussed with the patient and/or family; these include but are not limited to the risk of perforation, infection, bleeding, missed lesions, lack of diagnosis, severe illness requiring hospitalization, as well as anesthesia and sedation related illnesses.  The patient's history has been reviewed, patient examined, no change in status, and deemed stable for procedure.  The patient and/or family is agreeable to proceed.    Corliss Parish, MD [facility] Office # [phone]

## 2022-10-25 NOTE — Anesthesia Postprocedure Evaluation (Signed)
Anesthesia Post Note  Patient: Alice [name]  Procedure(s) Performed: ENDOSCOPIC RETROGRADE CHOLANGIOPANCREATOGRAPHY (ERCP) WITH PROPOFOL PANCREATIC STENT PLACEMENT SPHINCTEROTOMY REMOVAL OF STONES BIOPSY     Patient location during evaluation: PACU Anesthesia Type: General Level of consciousness: awake and alert Pain management: pain level controlled Vital Signs Assessment: post-procedure vital signs reviewed and stable Respiratory status: spontaneous breathing, nonlabored ventilation, respiratory function stable and patient connected to nasal cannula oxygen Cardiovascular status: blood pressure returned to baseline and stable Postop Assessment: no apparent nausea or vomiting Anesthetic complications: no   No notable events documented.  Last Vitals:  Vitals:   10/25/22 1450 10/25/22 1500  BP: (!) 136/91 (!) 141/83  Pulse: 67 65  Resp: 14 (!) 8  Temp:    SpO2: 96% 95%    Last Pain:  Vitals:   10/25/22 1500  TempSrc:   PainSc: 0-No pain                 Ariya Bohannon

## 2022-10-25 NOTE — Op Note (Signed)
[facility] Patient Name: Alice [name] Procedure Date: 10/25/2022 MRN: [id number] Attending MD: Corliss Parish , MD, [id number] Date of Birth: 26-Dec-1965 CSN: [id number] Age: 57 Admit Type: Outpatient Procedure:                ERCP Indications:              Bile duct stone(s), Abnormal MRCP Providers:                Corliss Parish, MD, Suzy Bouchard, RN, Zoe Lan, RN, Kandice Robinsons, Technician Referring MD:             Sanjuan Dame, MD, Doralee Albino. Rakes Medicines:                General Anesthesia, Cipro 400 mg IV, Diclofenac 100                            mg rectal, Glucagon 1.25 mg IV Complications:            No immediate complications. Estimated Blood Loss:     Estimated blood loss was minimal. Procedure:                Pre-Anesthesia Assessment:                           - Prior to the procedure, a History and Physical                            was performed, and patient medications and                            allergies were reviewed. The patient's tolerance of                            previous anesthesia was also reviewed. The risks                            and benefits of the procedure and the sedation                            options and risks were discussed with the patient.                            All questions were answered, and informed consent                            was obtained. Prior Anticoagulants: The patient has                            taken no anticoagulant or antiplatelet agents                            except for aspirin. ASA Grade Assessment: III - A  patient with severe systemic disease. After                            reviewing the risks and benefits, the patient was                            deemed in satisfactory condition to undergo the                            procedure.                           After obtaining informed consent, the scope was                             passed under direct vision. Throughout the                            procedure, the patient's blood pressure, pulse, and                            oxygen saturations were monitored continuously. The                            [employer] D single use                            duodenoscope was introduced through the mouth, and                            used to inject contrast into and used to inject                            contrast into the bile duct. The ERCP was                            determined to be ASGE Difficulty Grade 3 due to                            challenging cannulation. Successful completion of                            the procedure was aided by changing the patient's                            position, using manual pressure, straightening and                            shortening the scope to obtain bowel loop reduction                            and using scope torsion. The patient tolerated the  procedure. Scope In: Scope Out: Findings:      The scout film was normal.      The upper GI tract was traversed under direct vision without detailed       examination. Patchy mildly erythematous mucosa without bleeding was       found in the entire examined stomach - biopsied for H. pylori       assessment. No gross lesions were noted in the duodenal bulb, in the       first portion of the duodenum and in the second portion of the duodenum.      First attempt at biliary cannulation was not successful while using a       wire-guided approach. This led to placement of the wire within the       pancreatic duct. Decision was made to pursue a double-wire approach. The       short 0.035 inch soft Jagwire was left within the pancreatic duct.      Multiple further attempts at biliary cannulation with the Hydratome       sphincterotome were unsuccessful in the short and long and semilong       positions. I found  that the wire being in place made sphincterotome       adjustment more difficult.      Decision was made to proceed with placement of one 4 Fr by 7 cm       temporary plastic pancreatic stent with a single external flap was       placed into the ventral pancreatic duct. The stent was in good position.      Multiple further attempts at biliary cannulation with the Hydratome       sphincterotome were unsuccessful in the short and long and semilong       positions.      Decision was made to proceed with biliary precut fistulotomy attempt. A       biliary pre-cut sphincterotomy measuring 10 mm in length was made with a       monofilament needle knife using a freehand technique using ERBE       electrocautery. There was no post-sphincterotomy bleeding.      I could see a small amount of bile leakage. Subsequently a short 0.035       inch Soft Jagwire was passed into the biliary tree. The Hydratome       sphincterotome was passed over the guidewire and the bile duct was then       deeply cannulated. Contrast was injected. I personally interpreted the       bile duct and pancreatic duct images. Ductal flow of contrast was       adequate. Image quality was adequate. Contrast extended to the entire       biliary tree. Opacification of the entire biliary tree was successful.       The lower third of the main bile duct and middle third of the main bile       duct were mildly dilated. The largest diameter was 10 mm. I could not       visualize an overt filling defect within the main bile duct. The biliary       sphincterotomy was extended another 5 mm in length with a monofilament       Hydratome sphincterotome using ERBE electrocautery. There was no       post-sphincterotomy bleeding. To discover objects, the biliary tree was  swept with a retrieval balloon. Sludge was swept from the duct. An       occlusion cholangiogram was performed that showed no further significant       biliary  pathology.      A formal pancreatogram was not performed.      The duodenoscope was withdrawn from the patient. Impression:               - Erythematous mucosa in the stomach. Biopsied for                            H. pylori assessment.                           - No gross lesions in the duodenal bulb, in the                            first portion of the duodenum and in the second                            portion of the duodenum.                           - Difficult cannulation requiring double wire then                            pancreatic stent placement then precut fistulotomy                            to obtain access.                           - The middle third of the main bile duct and lower                            third of the main bile duct were mildly dilated.                           - One temporary plastic pancreatic stent was placed                            into the ventral pancreatic duct.                           - The biliary tree was swept and sludge was found. Moderate Sedation:      Not Applicable - Patient had care per Anesthesia. Recommendation:           - The patient will be observed post-procedure,                            until all discharge criteria are met.                           - Discharge patient to home.                           -  Patient has a contact number available for                            emergencies. The signs and symptoms of potential                            delayed complications were discussed with the                            patient. Return to normal activities tomorrow.                            Written discharge instructions were provided to the                            patient.                           - Low fat diet.                           - Observe patient's clinical course.                           - Check liver enzymes (AST, ALT, alkaline                            phosphatase, bilirubin) in 2  weeks.                           - Recommend referral to surgery to discuss                            cholecystectomy to decrease risk of further                            choledocholithiasis and biliary obstruction                            (although the stone looks to have passed on this                            particular examination).                           - Watch for pancreatitis, bleeding, perforation,                            and cholangitis.                           - Patient will need a KUB 2-view in 10-14 days to                            ensure pancreatic stent has migrated successfully.  If still present at that time will need to be                            scheduled for EGD with stent pull.                           - The findings and recommendations were discussed                            with the patient.                           - The findings and recommendations were discussed                            with the patient's family. Procedure Code(s):        --- Professional ---                           [phone], Endoscopic retrograde                            cholangiopancreatography (ERCP); with placement of                            endoscopic stent into biliary or pancreatic duct,                            including pre- and post-dilation and guide wire                            passage, when performed, including sphincterotomy,                            when performed, each stent                           43264, Endoscopic retrograde                            cholangiopancreatography (ERCP); with removal of                            calculi/debris from biliary/pancreatic duct(s)                           43262, 59, Endoscopic retrograde                            cholangiopancreatography (ERCP); with                            sphincterotomy/papillotomy                           74330, Combined endoscopic catheterization  of the  biliary and pancreatic ductal systems, radiological                            supervision and interpretation Diagnosis Code(s):        --- Professional ---                           K31.89, Other diseases of stomach and duodenum                           K80.50, Calculus of bile duct without cholangitis                            or cholecystitis without obstruction                           K83.8, Other specified diseases of biliary tract                           R93.2, Abnormal findings on diagnostic imaging of                            liver and biliary tract CPT copyright 2022 American Medical Association. All rights reserved. The codes documented in this report are preliminary and upon coder review may  be revised to meet current compliance requirements. Corliss Parish, MD 10/25/2022 2:33:48 PM Number of Addenda: 0

## 2022-10-25 NOTE — Telephone Encounter (Signed)
The order for labs and xray have been entered.  Will call pt in 2 weeks to ensure xray and labs are completed

## 2022-10-25 NOTE — Anesthesia Preprocedure Evaluation (Addendum)
Anesthesia Evaluation  Patient identified by MRN, date of birth, ID band Patient awake    Reviewed: Allergy & Precautions, H&P , NPO status , Patient's Chart, lab work & pertinent test results  Airway Mallampati: II  TM Distance: >3 FB Neck ROM: Full    Dental no notable dental hx. (+) Teeth Intact, Dental Advisory Given,    Pulmonary neg pulmonary ROS, COPD,  COPD inhaler, Current Smoker   Pulmonary exam normal breath sounds clear to auscultation       Cardiovascular Exercise Tolerance: Good hypertension, negative cardio ROS Normal cardiovascular exam+ Valvular Problems/Murmurs  Rhythm:Regular Rate:Normal  ECHO 1/24  1. Left ventricular ejection fraction, by estimation, is 60 to 65%. The  left ventricle has normal function. The left ventricle has no regional  wall motion abnormalities. There is severe left ventricular hypertrophy.  Left ventricular diastolic parameters   were normal.   2. Right ventricular systolic function was not well visualized. The right  ventricular size is normal. Tricuspid regurgitation signal is inadequate  for assessing PA pressure.   3. The mitral valve is grossly normal. No evidence of mitral valve  regurgitation. No evidence of mitral stenosis.   4. The aortic valve was not well visualized. Aortic valve regurgitation  is not visualized. No aortic stenosis is present.   5. The inferior vena cava is normal in size with greater than 50%  respiratory variability, suggesting right atrial pressure of 3 mmHg.      Neuro/Psych  PSYCHIATRIC DISORDERS Anxiety Depression    CVA negative neurological ROS  negative psych ROS   GI/Hepatic negative GI ROS, Neg liver ROS,GERD  Medicated,,(+)     substance abuse  cocaine use  Endo/Other  negative endocrine ROS    Renal/GU negative Renal ROS  negative genitourinary   Musculoskeletal negative musculoskeletal ROS (+) Arthritis , Osteoarthritis,     Abdominal   Peds negative pediatric ROS (+)  Hematology negative hematology ROS (+) Blood dyscrasia, anemia   Anesthesia Other Findings   Reproductive/Obstetrics negative OB ROS                             Anesthesia Physical Anesthesia Plan  ASA: 3  Anesthesia Plan: General   Post-op Pain Management: Minimal or no pain anticipated   Induction: Intravenous  PONV Risk Score and Plan: 2  Airway Management Planned: Oral ETT  Additional Equipment: None  Intra-op Plan:   Post-operative Plan: Extubation in OR  Informed Consent: I have reviewed the patients History and Physical, chart, labs and discussed the procedure including the risks, benefits and alternatives for the proposed anesthesia with the patient or authorized representative who has indicated his/her understanding and acceptance.       Plan Discussed with: Anesthesiologist and CRNA  Anesthesia Plan Comments: (  )        Anesthesia Quick Evaluation

## 2022-10-25 NOTE — Discharge Instructions (Signed)

## 2022-10-25 NOTE — Telephone Encounter (Signed)
Alice [name] contacted the on-call service provider this evening because of abdominal discomfort she was having following an ERCP.  She underwent an ERCP with Dr. Irish Lack Roddy earlier today for a dilated bile duct and concern for choledocholithiasis.  The ERCP was technically difficult and required double wire cannulation and freehand needle-knife sphincterotomy.  A pancreatic stent was left in place.  The patient does not recall feeling pain when leaving the hospital, but for the past couple of hours she has had a persistent abdominal pain that she has not experienced before.  The pain is not well localized other than the abdomen.  It is described as a pressure sensation.  It is not "doubling me over", but she is concerned.  She ate some mashed potatoes when she got home and this did not worsen her pain.  She is not having any nausea or vomiting.  No fevers or chills.  We discussed possible complications that can occur after a difficult ERCP, to include perforation, pancreatitis and bleeding.  I am reassured that the patient was able to eat food without significantly worsening her symptoms, and she is not having any nausea or vomiting nor fevers or chills.  Her pain does not seem to be rapidly progressing, and is just not improving quickly.  I am hopeful that her pain is just secondary to trapped air and suspect this will gradually improve.  We discussed symptoms that would warrant an emergency room visit this evening, which include progressively worsening pain severe nausea/vomiting or p.o. intolerance and fevers or chills.  I recommended the patient stick with water for now until her symptoms start to improve, then try to advance diet further.

## 2022-10-26 ENCOUNTER — Ambulatory Visit (HOSPITAL_COMMUNITY)
Admission: RE | Admit: 2022-10-26 | Discharge: 2022-10-26 | Disposition: A | Discharge status: Home / Self Care | Payer: Medicaid Other | Source: Ambulatory Visit | Attending: Gastroenterology | Admitting: Gastroenterology

## 2022-10-26 ENCOUNTER — Other Ambulatory Visit: Payer: Self-pay

## 2022-10-26 ENCOUNTER — Other Ambulatory Visit (HOSPITAL_COMMUNITY)
Admission: RE | Admit: 2022-10-26 | Discharge: 2022-10-26 | Disposition: A | Discharge status: Home / Self Care | Payer: Medicaid Other | Source: Ambulatory Visit | Attending: Gastroenterology | Admitting: Gastroenterology

## 2022-10-26 DIAGNOSIS — K838 Other specified diseases of biliary tract: Secondary | ICD-10-CM | POA: Diagnosis present

## 2022-10-26 DIAGNOSIS — R1012 Left upper quadrant pain: Secondary | ICD-10-CM

## 2022-10-26 DIAGNOSIS — G8918 Other acute postprocedural pain: Secondary | ICD-10-CM

## 2022-10-26 DIAGNOSIS — T85528A Displacement of other gastrointestinal prosthetic devices, implants and grafts, initial encounter: Secondary | ICD-10-CM | POA: Insufficient documentation

## 2022-10-26 DIAGNOSIS — I639 Cerebral infarction, unspecified: Secondary | ICD-10-CM | POA: Diagnosis not present

## 2022-10-26 LAB — CBC WITH DIFFERENTIAL/PLATELET
Abs Immature Granulocytes: 0.04 10*3/uL (ref 0.00–0.07)
Basophils Absolute: 0 10*3/uL (ref 0.0–0.1)
Basophils Relative: 0 %
Eosinophils Absolute: 0 10*3/uL (ref 0.0–0.5)
Eosinophils Relative: 0 %
HCT: 34.8 % — ABNORMAL LOW (ref 36.0–46.0)
Hemoglobin: 11.6 g/dL — ABNORMAL LOW (ref 12.0–15.0)
Immature Granulocytes: 0 %
Lymphocytes Relative: 8 %
Lymphs Abs: 1 10*3/uL (ref 0.7–4.0)
MCH: 30.9 pg (ref 26.0–34.0)
MCHC: 33.3 g/dL (ref 30.0–36.0)
MCV: 92.8 fL (ref 80.0–100.0)
Monocytes Absolute: 0.7 10*3/uL (ref 0.1–1.0)
Monocytes Relative: 6 %
Neutro Abs: 10.2 10*3/uL — ABNORMAL HIGH (ref 1.7–7.7)
Neutrophils Relative %: 86 %
Platelets: 191 10*3/uL (ref 150–400)
RBC: 3.75 MIL/uL — ABNORMAL LOW (ref 3.87–5.11)
RDW: 12.5 % (ref 11.5–15.5)
WBC: 12 10*3/uL — ABNORMAL HIGH (ref 4.0–10.5)
nRBC: 0 % (ref 0.0–0.2)

## 2022-10-26 LAB — HEPATIC FUNCTION PANEL
ALT: 14 U/L (ref 0–44)
AST: 18 U/L (ref 15–41)
Albumin: 4.1 g/dL (ref 3.5–5.0)
Alkaline Phosphatase: 67 U/L (ref 38–126)
Bilirubin, Direct: 0.1 mg/dL (ref 0.0–0.2)
Indirect Bilirubin: 0.5 mg/dL (ref 0.3–0.9)
Total Bilirubin: 0.6 mg/dL (ref 0.3–1.2)
Total Protein: 7.6 g/dL (ref 6.5–8.1)

## 2022-10-26 LAB — COMPREHENSIVE METABOLIC PANEL
ALT: 14 U/L (ref 0–44)
AST: 19 U/L (ref 15–41)
Albumin: 4.2 g/dL (ref 3.5–5.0)
Alkaline Phosphatase: 68 U/L (ref 38–126)
Anion gap: 6 (ref 5–15)
BUN: 14 mg/dL (ref 6–20)
CO2: 26 mmol/L (ref 22–32)
Calcium: 9.1 mg/dL (ref 8.9–10.3)
Chloride: 104 mmol/L (ref 98–111)
Creatinine, Ser: 0.71 mg/dL (ref 0.44–1.00)
GFR, Estimated: >60 mL/min (ref >60)
Glucose, Bld: 121 mg/dL — ABNORMAL HIGH (ref 70–99)
Potassium: 4 mmol/L (ref 3.5–5.1)
Sodium: 136 mmol/L (ref 135–145)
Total Bilirubin: 0.6 mg/dL (ref 0.3–1.2)
Total Protein: 7.6 g/dL (ref 6.5–8.1)

## 2022-10-26 LAB — AMYLASE: Amylase: 80 U/L (ref 28–100)

## 2022-10-26 LAB — LIPASE, BLOOD: Lipase: 60 U/L — ABNORMAL HIGH (ref 11–51)

## 2022-10-26 MED ORDER — CIPROFLOXACIN HCL 500 MG PO TABS: 500.0000 mg | ORAL_TABLET | Freq: Two times a day (BID) | ORAL | 0 refills | Status: AC

## 2022-10-26 NOTE — Telephone Encounter (Signed)
SEC, Thank you for speaking with patient.  Alice [name],  Reach out to patient today and see how she is doing. If she is still having pain/discomfort but is tolerating oral intake/fluids, then recommend she come in for labs (CBC/CMP/Amylase/Lipase) + CXR 2-view + KUB 2-view to rule out perforation and pancreatitis. If she is still having pain/discomfort and is not tolerating oral intake/fluids, then recommend she come to the ED for further evaluation and rule out complication. Thanks. GM

## 2022-10-26 NOTE — Telephone Encounter (Signed)
Thanks. Agree with STAT labs and Xrays. GM

## 2022-10-26 NOTE — Telephone Encounter (Signed)
I spoke with the pt and she states that she is able to eat and drink normally  She says the pain is some better and was able to sleep about 4 hours.   She agrees to the labs and xray-orders entered to be done at [facility] due to transportation issues.

## 2022-10-27 ENCOUNTER — Encounter (HOSPITAL_COMMUNITY): Payer: Self-pay | Admitting: Gastroenterology

## 2022-10-27 DIAGNOSIS — I639 Cerebral infarction, unspecified: Secondary | ICD-10-CM | POA: Diagnosis not present

## 2022-10-28 ENCOUNTER — Encounter (INDEPENDENT_AMBULATORY_CARE_PROVIDER_SITE_OTHER): Payer: Self-pay | Admitting: Gastroenterology

## 2022-10-28 DIAGNOSIS — I639 Cerebral infarction, unspecified: Secondary | ICD-10-CM | POA: Diagnosis not present

## 2022-10-28 DIAGNOSIS — T85528A Displacement of other gastrointestinal prosthetic devices, implants and grafts, initial encounter: Secondary | ICD-10-CM

## 2022-10-28 LAB — SURGICAL PATHOLOGY

## 2022-10-28 NOTE — Telephone Encounter (Signed)
Inbound call from patient, states she is still in extreme pain. States stents that were placed should fall out and she is unsure of when. Would like to discuss with a nurse.

## 2022-10-28 NOTE — Telephone Encounter (Signed)
Spoke with the pt and she tells me that she has begun to have severe abd pain that is pretty general in nature.  She is not eating and drinking today. She has been advised to go to the ED for eval per last note with Dr Meridee Score (if pain persist ED eval)

## 2022-10-28 NOTE — Telephone Encounter (Signed)
Stat read has been requested as ordered

## 2022-10-28 NOTE — Progress Notes (Signed)
Provided follow-up post-procedure call to patient as standard. Patient stated she had pain that is new. Patient reported that she has called Dr. Elesa Hacker office and has been instructed to go the the ED. This Clinical research associate agreed and also encouraged patient to come to the ED. Multiple communications were noted in patient's chart by various GI medical staff over the last couple of days. Patient stated, "I am afraid to find something else is wrong or what it could be." This writer reiterated the importance of going to the ED to seek out pain management to causes. Dr. Meridee Score was made aware in person of this post-procedure phone encounter with this patient today.

## 2022-10-28 NOTE — Telephone Encounter (Signed)
Agree. Please also get the STAT reads from the CXR/KUB, they are not back yet. GM

## 2022-10-29 ENCOUNTER — Encounter: Payer: Self-pay | Admitting: Gastroenterology

## 2022-10-29 ENCOUNTER — Telehealth (INDEPENDENT_AMBULATORY_CARE_PROVIDER_SITE_OTHER): Payer: Self-pay | Admitting: *Deleted

## 2022-10-29 DIAGNOSIS — I639 Cerebral infarction, unspecified: Secondary | ICD-10-CM | POA: Diagnosis not present

## 2022-10-29 NOTE — Telephone Encounter (Signed)
Had ERCP by Dr Meridee Score and now needs a scan to see if stint is gone

## 2022-10-29 NOTE — Telephone Encounter (Signed)
We have told the patient to go to the hospital if she continues to have issues post procedure.  I heard from my endoscopy team on 10/17 that she was still having issues and she was related that she should go to the hospital.  Patty, Please reach out to the patient and find out how she is doing and if she still having issues she needs to come into the emergency department. GM

## 2022-10-30 DIAGNOSIS — I639 Cerebral infarction, unspecified: Secondary | ICD-10-CM | POA: Diagnosis not present

## 2022-10-31 DIAGNOSIS — I639 Cerebral infarction, unspecified: Secondary | ICD-10-CM | POA: Diagnosis not present

## 2022-11-01 DIAGNOSIS — I639 Cerebral infarction, unspecified: Secondary | ICD-10-CM | POA: Diagnosis not present

## 2022-11-01 NOTE — Telephone Encounter (Signed)
Xray ordered by Dr.Mansouraty

## 2022-11-02 DIAGNOSIS — I639 Cerebral infarction, unspecified: Secondary | ICD-10-CM | POA: Diagnosis not present

## 2022-11-03 ENCOUNTER — Ambulatory Visit (HOSPITAL_COMMUNITY)
Admission: RE | Admit: 2022-11-03 | Discharge: 2022-11-03 | Disposition: A | Discharge status: Home / Self Care | Payer: Medicaid Other | Source: Ambulatory Visit | Attending: Gastroenterology | Admitting: Gastroenterology

## 2022-11-03 ENCOUNTER — Telehealth: Payer: Self-pay

## 2022-11-03 ENCOUNTER — Telehealth: Payer: Self-pay | Admitting: Gastroenterology

## 2022-11-03 DIAGNOSIS — I639 Cerebral infarction, unspecified: Secondary | ICD-10-CM | POA: Diagnosis not present

## 2022-11-03 DIAGNOSIS — T85528A Displacement of other gastrointestinal prosthetic devices, implants and grafts, initial encounter: Secondary | ICD-10-CM | POA: Diagnosis present

## 2022-11-03 NOTE — Telephone Encounter (Signed)
Inbound call from patient requesting to speak with nurse. Patient is confused and anxious about stent removal. Please advise.

## 2022-11-03 NOTE — Telephone Encounter (Signed)
I have again explained to the pt that she will need to have Xray on Monday to see if the stent is still in place. If the stent will need to be removed we will call the pt at that time.

## 2022-11-03 NOTE — Addendum Note (Signed)
Addended by: Vista Lawman on: 11/03/2022 09:31 AM   Modules accepted: Orders

## 2022-11-03 NOTE — Telephone Encounter (Signed)
I called the patient back and she says she already has spoken with Dr. Marcy Panning  and had her answer, to disregard her call.   Patient called today inquiring when she should have her xray to get stent removed.

## 2022-11-04 DIAGNOSIS — I639 Cerebral infarction, unspecified: Secondary | ICD-10-CM | POA: Diagnosis not present

## 2022-11-04 NOTE — Plan of Care (Signed)
[facility] Tonsillectomy/Adenoidectomy, Postoperative PEDS care plan entered in error.

## 2022-11-05 DIAGNOSIS — I639 Cerebral infarction, unspecified: Secondary | ICD-10-CM | POA: Diagnosis not present

## 2022-11-06 DIAGNOSIS — I639 Cerebral infarction, unspecified: Secondary | ICD-10-CM | POA: Diagnosis not present

## 2022-11-08 DIAGNOSIS — I639 Cerebral infarction, unspecified: Secondary | ICD-10-CM | POA: Diagnosis not present

## 2022-11-09 ENCOUNTER — Encounter: Payer: Self-pay | Admitting: Urology

## 2022-11-09 ENCOUNTER — Other Ambulatory Visit: Payer: Self-pay | Admitting: Urology

## 2022-11-09 DIAGNOSIS — I639 Cerebral infarction, unspecified: Secondary | ICD-10-CM | POA: Diagnosis not present

## 2022-11-09 MED ORDER — FLUCONAZOLE 100 MG PO TABS: 100.0000 mg | ORAL_TABLET | Freq: Every day | ORAL | 0 refills | Status: AC

## 2022-11-10 ENCOUNTER — Telehealth: Payer: Self-pay | Admitting: Family Medicine

## 2022-11-10 DIAGNOSIS — I639 Cerebral infarction, unspecified: Secondary | ICD-10-CM | POA: Diagnosis not present

## 2022-11-10 NOTE — Telephone Encounter (Signed)
Feels like body is on fire and is worrying a lot.  Informed pt that a decision cannot be made without xray result. If she is having terrible pain then she should return to the ED. Advised pt to limit fat intake to help with pain. Pt agreed to go to ED if pain worsens. Otherwise, will wait on xray results.

## 2022-11-10 NOTE — Telephone Encounter (Signed)
Pt says she is very worried that something is wrong since having galstones removed. Still waiting on her xray results to come back that were done on 10/23. Says she is in a lot of pain and was told that she needed to wait until xray results come back so she doesn't know if anything else can be done to help her.   Please advise.

## 2022-11-11 ENCOUNTER — Emergency Department (HOSPITAL_COMMUNITY): Payer: Medicaid Other

## 2022-11-11 ENCOUNTER — Other Ambulatory Visit: Payer: Self-pay

## 2022-11-11 ENCOUNTER — Emergency Department (HOSPITAL_COMMUNITY)
Admission: EM | Admit: 2022-11-11 | Discharge: 2022-11-11 | Disposition: A | Discharge status: Home / Self Care | Payer: Medicaid Other | Attending: Emergency Medicine | Admitting: Emergency Medicine

## 2022-11-11 ENCOUNTER — Encounter (HOSPITAL_COMMUNITY): Payer: Self-pay

## 2022-11-11 DIAGNOSIS — R109 Unspecified abdominal pain: Secondary | ICD-10-CM | POA: Diagnosis present

## 2022-11-11 DIAGNOSIS — R1013 Epigastric pain: Secondary | ICD-10-CM | POA: Diagnosis not present

## 2022-11-11 DIAGNOSIS — I639 Cerebral infarction, unspecified: Secondary | ICD-10-CM | POA: Diagnosis not present

## 2022-11-11 LAB — CBC WITH DIFFERENTIAL/PLATELET
Abs Immature Granulocytes: 0.02 10*3/uL (ref 0.00–0.07)
Basophils Absolute: 0 10*3/uL (ref 0.0–0.1)
Basophils Relative: 1 %
Eosinophils Absolute: 0.2 10*3/uL (ref 0.0–0.5)
Eosinophils Relative: 3 %
HCT: 38.1 % (ref 36.0–46.0)
Hemoglobin: 12.6 g/dL (ref 12.0–15.0)
Immature Granulocytes: 0 %
Lymphocytes Relative: 23 %
Lymphs Abs: 1.5 10*3/uL (ref 0.7–4.0)
MCH: 30.7 pg (ref 26.0–34.0)
MCHC: 33.1 g/dL (ref 30.0–36.0)
MCV: 92.9 fL (ref 80.0–100.0)
Monocytes Absolute: 0.4 10*3/uL (ref 0.1–1.0)
Monocytes Relative: 6 %
Neutro Abs: 4.5 10*3/uL (ref 1.7–7.7)
Neutrophils Relative %: 67 %
Platelets: 211 10*3/uL (ref 150–400)
RBC: 4.1 MIL/uL (ref 3.87–5.11)
RDW: 12.3 % (ref 11.5–15.5)
WBC: 6.7 10*3/uL (ref 4.0–10.5)
nRBC: 0 % (ref 0.0–0.2)

## 2022-11-11 LAB — COMPREHENSIVE METABOLIC PANEL
ALT: 13 U/L (ref 0–44)
AST: 19 U/L (ref 15–41)
Albumin: 4.3 g/dL (ref 3.5–5.0)
Alkaline Phosphatase: 78 U/L (ref 38–126)
Anion gap: 11 (ref 5–15)
BUN: 10 mg/dL (ref 6–20)
CO2: 28 mmol/L (ref 22–32)
Calcium: 9.3 mg/dL (ref 8.9–10.3)
Chloride: 101 mmol/L (ref 98–111)
Creatinine, Ser: 0.57 mg/dL (ref 0.44–1.00)
GFR, Estimated: >60 mL/min (ref >60)
Glucose, Bld: 94 mg/dL (ref 70–99)
Potassium: 3.4 mmol/L — ABNORMAL LOW (ref 3.5–5.1)
Sodium: 140 mmol/L (ref 135–145)
Total Bilirubin: 0.8 mg/dL (ref 0.3–1.2)
Total Protein: 8 g/dL (ref 6.5–8.1)

## 2022-11-11 LAB — LIPASE, BLOOD: Lipase: 22 U/L (ref 11–51)

## 2022-11-11 MED ORDER — IOHEXOL 300 MG/ML  SOLN
100.0000 mL | Freq: Once | INTRAMUSCULAR | Status: AC | PRN
  Administered 2022-11-11: 100 mL via INTRAVENOUS

## 2022-11-11 MED ORDER — CLOTRIMAZOLE 1 % EX CREA: TOPICAL_CREAM | CUTANEOUS | 0 refills | Status: DC

## 2022-11-11 MED ORDER — TRIAMCINOLONE ACETONIDE 0.1 % EX CREA: TOPICAL_CREAM | Freq: Every day | CUTANEOUS | 0 refills | Status: AC

## 2022-11-11 NOTE — ED Triage Notes (Signed)
Pt c/o skin yeast infection, abdominal pain when eating, feels that this is "my body is rejecting the stent". Dr. Meridee Score placed a pancreatic stent on 10/14.

## 2022-11-11 NOTE — Telephone Encounter (Signed)
Inbound call from patient, states she is going to the Emergency Room due to she believes issues with her Stent. States she would like to let Dr. Sharol Roussel. She states she feels it is like a burning sensation.

## 2022-11-11 NOTE — ED Provider Triage Note (Signed)
Emergency Medicine Provider Triage Evaluation Note  Alice [name] , a 57 y.o. female  was evaluated in triage.  Pt complains of abdominal pain.  Review of Systems  Positive:  Negative:   Physical Exam  BP (!) 143/104   Pulse 73   Temp 98.4 F (36.9 C) (Oral)   Resp 18   LMP 07/30/2011   SpO2 95%  Gen:   Awake, no distress   Resp:  Normal effort  MSK:   Moves extremities without difficulty  Other:    Medical Decision Making  Medically screening exam initiated at 2:40 PM.  Appropriate orders placed.  Alice [name] was informed that the remainder of the evaluation will be completed by another provider, this initial triage assessment does not replace that evaluation, and the importance of remaining in the ED until their evaluation is complete.  Pancreatic stent placed on 10/14. Thinks that her body is rejecting the stent because she is still having abdominal pain after eating and now with rash on chest/abdomen.   Denies fever, chest pain, dyspnea, cough, nausea, vomiting, diarrhea, dysuria, hematuria, hematochezia.  Patient with mild erythema under fold of stomach. Mild skin breakdown. Same rash under right breast fold.     [name], [place] 11/11/22 1447

## 2022-11-11 NOTE — Telephone Encounter (Signed)
Noted  

## 2022-11-11 NOTE — Discharge Instructions (Addendum)
The CT scan showed that the pancreatic stent is now in your intestines and will make its way out in a bowel movement.  This is normal and expected.  Please follow-up with your appointment with Dr. Tasia Catchings on 12/08/2022.  Your lab work today here was normal.  We checked your liver and kidney function which is normal.  Your pancreatic enzyme, lipase, was normal.  Your electrolytes are normal aside from a slightly low potassium at 3.4.  Please continue to eat potassium rich foods such as bananas, potatoes, avocados.  You have been prescribed an antifungal cream called ketoconazole to use on your abdominal rash.   Return the ER for any fever, uncontrolled vomiting, severe worsening of your abdominal pain, any other new or concerning symptoms.

## 2022-11-11 NOTE — ED Provider Notes (Signed)
[place] EMERGENCY DEPARTMENT AT [facility] Provider Note   CSN: [id number] Arrival date & time: 11/11/22  1402     History  Chief Complaint  Patient presents with   Abdominal Pain    Alice [name] is a 57 y.o. female with choledocholithiasis with stent placement on 10/25/2022 with Dr. Nelson Chimes, presents with concern for abdominal pain after eating for the past couple days.  Pain feels like a burning sensation in the chest after she eats.  Denies any nausea or vomiting, fever or chills.  Reports normal bowel movements.  She also reports concern for a possible yeast infection on her abdomen.   Abdominal Pain      Home Medications Prior to Admission medications   Medication Sig Start Date End Date Taking? Authorizing Provider  clotrimazole (LOTRIMIN) 1 % cream Apply to affected area 2 times daily 11/11/22  Yes Arabella Merles, PA-C  fluconazole (DIFLUCAN) 100 MG tablet Take 1 tablet (100 mg total) by mouth daily for 3 days. 11/09/22 11/12/22  Stoneking, Danford Bad., MD  ketoconazole 2%-triamcinolone 0.1% 1:2 cream mixture Apply topically daily for 7 days. 11/11/22 11/18/22 Yes Arabella Merles, PA-C  aspirin EC 81 MG tablet Take 1 tablet (81 mg total) by mouth daily with breakfast. For Stroke Prevention 07/25/21   Shon Hale, MD  atorvastatin (LIPITOR) 40 MG tablet Take 1 tablet (40 mg total) by mouth daily. 06/15/22   Sonny Masters, FNP  baclofen (LIORESAL) 10 MG tablet Take 1 tablet (10 mg total) by mouth 2 (two) times daily. 07/25/21   Shon Hale, MD  famotidine (PEPCID) 20 MG tablet One after supper Patient taking differently: Take 20 mg by mouth daily. One after supper 01/14/22   Nyoka Cowden, MD  gabapentin (NEURONTIN) 100 MG capsule Take 1 capsule (100 mg total) by mouth 4 (four) times daily. 01/14/22   Nyoka Cowden, MD  pantoprazole (PROTONIX) 40 MG tablet TAKE 1 TABLET BY MOUTH EVERY DAY 10/18/22   Rakes, Doralee Albino, FNP  polyethylene glycol powder  (GLYCOLAX/MIRALAX) 17 GM/SCOOP powder Take 17 g by mouth daily. 03/12/22   Sonny Masters, FNP  psyllium (METAMUCIL) 58.6 % packet Take 1 packet by mouth 2 (two) times daily. 08/27/22 11/25/22  Ahmed, Juanetta Beets, MD  SUBOXONE 8-2 MG FILM Place 0.5 Film under the tongue every 6 (six) hours. 10/15/20   [provider]      Allergies    Benadryl [diphenhydramine hcl], Codeine, and Tetracycline    Review of Systems   Review of Systems  Gastrointestinal:  Positive for abdominal pain.    Physical Exam Updated Vital Signs BP 132/86   Pulse 61   Temp 98.2 F (36.8 C)   Resp 17   LMP 07/30/2011   SpO2 98%  Physical Exam Vitals and nursing note reviewed.  Constitutional:      General: She is not in acute distress.    Appearance: She is well-developed.  HENT:     Head: Normocephalic and atraumatic.  Eyes:     Conjunctiva/sclera: Conjunctivae normal.  Cardiovascular:     Rate and Rhythm: Normal rate and regular rhythm.     Heart sounds: Murmur heard.  Pulmonary:     Effort: Pulmonary effort is normal. No respiratory distress.     Breath sounds: Normal breath sounds.  Abdominal:     Palpations: Abdomen is soft.     Tenderness: There is no abdominal tenderness.     Comments: Abdomen soft and nontender to palpation diffusely,  normal bowel sounds anesthesia.  Patient with skinfold on the abdomen with cream applied in the crease, reports red rash is area although no obvious rash appreciated.  Musculoskeletal:        General: No swelling.     Cervical back: Neck supple.  Skin:    General: Skin is warm and dry.     Capillary Refill: Capillary refill takes less than 2 seconds.  Neurological:     Mental Status: She is alert.  Psychiatric:        Mood and Affect: Mood normal.     ED Results / Procedures / Treatments   Labs (all labs ordered are listed, but only abnormal results are displayed) Labs Reviewed  COMPREHENSIVE METABOLIC PANEL - Abnormal; Notable for the following  components:      Result Value   Potassium 3.4 (*)    All other components within normal limits  LIPASE, BLOOD  CBC WITH DIFFERENTIAL/PLATELET    EKG None  Radiology CT ABDOMEN PELVIS W CONTRAST  Result Date: 11/11/2022 CLINICAL DATA:  Abdominal pain postoperatively. Abdominal pain when eating. Pancreatic stent was placed on 10/25/2022 EXAM: CT ABDOMEN AND PELVIS WITH CONTRAST TECHNIQUE: Multidetector CT imaging of the abdomen and pelvis was performed using the standard protocol following bolus administration of intravenous contrast. RADIATION DOSE REDUCTION: This exam was performed according to the departmental dose-optimization program which includes automated exposure control, adjustment of the mA and/or kV according to patient size and/or use of iterative reconstruction technique. CONTRAST:  OMNIPAQUE IOHEXOL 300 MG/ML  SOLN COMPARISON:  Abdominal radiograph 11/03/2022. CT abdomen and pelvis 06/03/2022. MRI abdomen 09/09/2022 FINDINGS: Lower chest: Mild dependent atelectasis in the lung bases. Hepatobiliary: Mild diffuse fatty infiltration of the liver. No focal lesions. Cholelithiasis without inflammatory stranding. Mild bile duct dilatation. Pancreas: Diffuse fatty infiltration of the pancreas. No inflammatory changes or focal lesion. No pancreatic ductal dilatation. Spleen: Normal in size without focal abnormality. Adrenals/Urinary Tract: No adrenal gland nodules. Kidneys are symmetrical with homogeneous nephrograms. No hydronephrosis or hydroureter. Bladder is normal. Stomach/Bowel: Stomach, small bowel, and colon are not abnormally distended. No wall thickening or inflammatory changes. Stool diffusely throughout the colon. Linear radiopaque structure with pigtail demonstrated in the cecum consistent with the migrated pancreatic stent. Diverticulosis of the sigmoid colon without evidence of acute diverticulitis. Appendix is normal. Vascular/Lymphatic: Aortic atherosclerosis. No enlarged  abdominal or pelvic lymph nodes. Reproductive: Status post hysterectomy. No adnexal masses. Other: No free air or free fluid in the abdomen. Abdominal wall musculature appears intact. Musculoskeletal: Postoperative fixation of the lower lumbar spine. Multiple vertebral compression deformities without change since prior study. Degenerative changes in the spine. IMPRESSION: 1. 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 No evidence of bowel obstruction or inflammation. 2. Cholelithiasis without evidence of acute cholecystitis. Mild bile duct dilatation, similar to prior study. 3. Mild diffuse fatty infiltration of the liver. 4. Aortic atherosclerosis. Electronically Signed   By: Burman Nieves M.D.   On: 11/11/2022 19:25    Procedures Procedures    Medications Ordered in ED Medications  iohexol (OMNIPAQUE) 300 MG/ML solution 100 mL (100 mLs Intravenous Contrast Given 11/11/22 1728)    ED Course/ Medical Decision Making/ A&P                                 Medical Decision Making    Differential diagnosis includes but is not limited to GERD, cholelithiasis, pancreatic stent post-op infection,  post-ERCP pancreatitis  ED Course:  Patient overall well-appearing, no acute distress.  Vital signs completely normal.  She reports concern for abdominal pain that occurs after she eats for the past couple of days.  Abdomen soft and nontender to palpation, patient moving around comfortably.  No active vomiting or nausea.  She had concern for her pancreatic stent that was placed on 10/25/2022 with Dr. Meridee Score.  CT abdomen and pelvis was obtained which showed that the stent had migrated into the cecum.  GI was consulted, and they recommended letting it pass in the stool and having her follow-up with GI outpatient.  No management needed at this time inpatient.  CBC, CMP, lipase all normal.  No concern for any acute abdominal pathology at this time.  Suspect her pain could be due to GERD  given the burning sensation after she eats.  She is already on pantoprazole and famotidine.  She will be able to follow this up with GI at her appointment outpatient.  She also reported concern for a red itchy rash under her abdominal skin fold and right breast that has been there for the past couple days.  Reports using ketoconazole cream which has helped but has not completely resolved the rash.  She just ran out of this cream, I have prescribed her additional ketoconazole to use on the area as I suspect a yeast infection.  Recommend she follow-up with her PCP if her rash is not improved within the next week.   Impression: Abdominal pain Abdominal rash, likely yeast   Disposition:  The patient was discharged home with instructions to follow-up with Dr. Tasia Catchings at her appointment in November to further discuss her abdominal pain.  Keep her skin clean and dry.  Use the ketoconazole cream as prescribed.  She was informed that her potassium was slightly low and to increase dietary potassium. Return precautions given.  Lab Tests: I Ordered, and personally interpreted labs.  The pertinent results include:   CBC with no leukocytosis CMP unremarkable, slight hypokalemia at 3.4 Lipase within normal limits  Imaging Studies ordered: I ordered imaging studies including CT abdomen pelvis I independently visualized the imaging with scope of interpretation limited to determining acute life threatening conditions related to emergency care. Imaging showed pancreatic duct in the cecum I agree with the radiologist interpretation   Consultations Obtained: I requested consultation with the gastroenterologist Dr. Myrtie Neither,  and discussed lab and imaging findings as well as pertinent plan - they recommend: Allowing the stent to pass in the stool and to have patient follow-up with GI outpatient.  External records from outside source obtained and reviewed including note from Dr. Nelson Chimes on 10/25/2022 for ERCP for  choledocholithiasis, placing a pancreatic stent              Final Clinical Impression(s) / ED Diagnoses Final diagnoses:  Epigastric pain    Rx / DC Orders ED Discharge Orders          Ordered    clotrimazole (LOTRIMIN) 1 % cream        11/11/22 2118    ketoconazole 2%-triamcinolone 0.1% 1:2 cream mixture  Daily        11/11/22 2127              Arabella Merles, PA-C 11/11/22 2320    Bethann Berkshire, MD 11/15/22 1106

## 2022-11-12 ENCOUNTER — Encounter: Payer: Self-pay | Admitting: Family Medicine

## 2022-11-12 DIAGNOSIS — I639 Cerebral infarction, unspecified: Secondary | ICD-10-CM | POA: Diagnosis not present

## 2022-11-12 NOTE — Telephone Encounter (Signed)
She has not been seen since 06/2022, she will need to make an appointment to have this evaluated.

## 2022-11-13 DIAGNOSIS — I639 Cerebral infarction, unspecified: Secondary | ICD-10-CM | POA: Diagnosis not present

## 2022-11-14 DIAGNOSIS — I639 Cerebral infarction, unspecified: Secondary | ICD-10-CM | POA: Diagnosis not present

## 2022-11-15 ENCOUNTER — Encounter (INDEPENDENT_AMBULATORY_CARE_PROVIDER_SITE_OTHER): Payer: Self-pay | Admitting: *Deleted

## 2022-11-15 DIAGNOSIS — R32 Unspecified urinary incontinence: Secondary | ICD-10-CM | POA: Diagnosis not present

## 2022-11-15 DIAGNOSIS — I639 Cerebral infarction, unspecified: Secondary | ICD-10-CM | POA: Diagnosis not present

## 2022-11-15 DIAGNOSIS — J449 Chronic obstructive pulmonary disease, unspecified: Secondary | ICD-10-CM | POA: Diagnosis not present

## 2022-11-15 NOTE — Progress Notes (Signed)
Hi Wendy ,  Can you please call the patient and tell the x-ray abdomen shows that the pancreatic duct stent has migrated to the colon which was expected and nothing to be concerned about  Recommend to continue see [place] in the clinic  Thanks,  Vista Lawman, MD Gastroenterology and Hepatology [facility] Gastroenterology

## 2022-11-16 ENCOUNTER — Other Ambulatory Visit: Payer: Self-pay | Admitting: *Deleted

## 2022-11-16 DIAGNOSIS — F17210 Nicotine dependence, cigarettes, uncomplicated: Secondary | ICD-10-CM

## 2022-11-16 DIAGNOSIS — Z87891 Personal history of nicotine dependence: Secondary | ICD-10-CM

## 2022-11-16 DIAGNOSIS — Z122 Encounter for screening for malignant neoplasm of respiratory organs: Secondary | ICD-10-CM

## 2022-11-16 DIAGNOSIS — I639 Cerebral infarction, unspecified: Secondary | ICD-10-CM | POA: Diagnosis not present

## 2022-11-17 DIAGNOSIS — I639 Cerebral infarction, unspecified: Secondary | ICD-10-CM | POA: Diagnosis not present

## 2022-11-18 ENCOUNTER — Ambulatory Visit: Payer: Medicaid Other | Admitting: Acute Care

## 2022-11-18 DIAGNOSIS — F17210 Nicotine dependence, cigarettes, uncomplicated: Secondary | ICD-10-CM | POA: Diagnosis not present

## 2022-11-18 DIAGNOSIS — I639 Cerebral infarction, unspecified: Secondary | ICD-10-CM | POA: Diagnosis not present

## 2022-11-18 NOTE — Patient Instructions (Signed)

## 2022-11-18 NOTE — Progress Notes (Signed)
 Virtual Visit via Telephone Note  I connected with Alice [name] on 11/18/22 at  3:00 PM EST by telephone and verified that I am speaking with the correct person using two identifiers.  Location: Patient: in home Provider: [address], [place], [place], [address]  Shared Decision Making Visit Lung Cancer Screening Program [phone])   Eligibility: Age 57 y.o. Pack Years Smoking History Calculation 61 (# packs/per year x # years smoked) Recent History of coughing up blood  no Unexplained weight loss? no ( >Than 15 pounds within the last 6 months ) Prior History Lung / other cancer no (Diagnosis within the last 5 years already requiring surveillance chest CT Scans). Smoking Status Current Smoker Former Smokers: Years since quit: NA  Quit Date: NA  Visit Components: Discussion included one or more decision making aids. yes Discussion included risk/benefits of screening. yes Discussion included potential follow up diagnostic testing for abnormal scans. yes Discussion included meaning and risk of over diagnosis. yes Discussion included meaning and risk of False Positives. yes Discussion included meaning of total radiation exposure. yes  Counseling Included: Importance of adherence to annual lung cancer LDCT screening. yes Impact of comorbidities on ability to participate in the program. yes Ability and willingness to under diagnostic treatment. yes  Smoking Cessation Counseling: Current Smokers:  Discussed importance of smoking cessation. yes Information about tobacco cessation classes and interventions provided to patient. yes Patient provided with "ticket" for LDCT Scan. yes Symptomatic Patient. no  Counseling NA Diagnosis Code: Tobacco Use Z72.0 Asymptomatic Patient yes  Counseling (Intermediate counseling: > three minutes counseling) W1027 Former Smokers:  Discussed the importance of maintaining cigarette abstinence. yes Diagnosis Code: Personal History of Nicotine  Dependence. O53.664 Information about tobacco cessation classes and interventions provided to patient. Yes Patient provided with "ticket" for LDCT Scan. yes Written Order for Lung Cancer Screening with LDCT placed in Epic. Yes (CT Chest Lung Cancer Screening Low Dose W/O CM) [id number] Z12.2-Screening of respiratory organs Z87.891-Personal history of nicotine dependence   Karl Bales, RN 11/18/22

## 2022-11-19 DIAGNOSIS — I639 Cerebral infarction, unspecified: Secondary | ICD-10-CM | POA: Diagnosis not present

## 2022-11-20 DIAGNOSIS — I639 Cerebral infarction, unspecified: Secondary | ICD-10-CM | POA: Diagnosis not present

## 2022-11-21 DIAGNOSIS — I639 Cerebral infarction, unspecified: Secondary | ICD-10-CM | POA: Diagnosis not present

## 2022-11-22 DIAGNOSIS — I639 Cerebral infarction, unspecified: Secondary | ICD-10-CM | POA: Diagnosis not present

## 2022-11-23 DIAGNOSIS — I639 Cerebral infarction, unspecified: Secondary | ICD-10-CM | POA: Diagnosis not present

## 2022-11-24 DIAGNOSIS — I639 Cerebral infarction, unspecified: Secondary | ICD-10-CM | POA: Diagnosis not present

## 2022-11-25 ENCOUNTER — Other Ambulatory Visit: Payer: Self-pay | Admitting: Urology

## 2022-11-25 ENCOUNTER — Encounter: Payer: Self-pay | Admitting: Urology

## 2022-11-25 DIAGNOSIS — I639 Cerebral infarction, unspecified: Secondary | ICD-10-CM | POA: Diagnosis not present

## 2022-11-25 DIAGNOSIS — N390 Urinary tract infection, site not specified: Secondary | ICD-10-CM

## 2022-11-25 MED ORDER — NITROFURANTOIN MONOHYD MACRO 100 MG PO CAPS
100.0000 mg | ORAL_CAPSULE | Freq: Two times a day (BID) | ORAL | 0 refills | Status: DC
Start: 2022-11-25 — End: 2022-12-08

## 2022-11-26 DIAGNOSIS — I639 Cerebral infarction, unspecified: Secondary | ICD-10-CM | POA: Diagnosis not present

## 2022-11-27 DIAGNOSIS — I639 Cerebral infarction, unspecified: Secondary | ICD-10-CM | POA: Diagnosis not present

## 2022-11-28 DIAGNOSIS — I639 Cerebral infarction, unspecified: Secondary | ICD-10-CM | POA: Diagnosis not present

## 2022-11-29 DIAGNOSIS — I639 Cerebral infarction, unspecified: Secondary | ICD-10-CM | POA: Diagnosis not present

## 2022-11-30 DIAGNOSIS — I639 Cerebral infarction, unspecified: Secondary | ICD-10-CM | POA: Diagnosis not present

## 2022-12-01 ENCOUNTER — Ambulatory Visit (HOSPITAL_COMMUNITY)
Admission: RE | Admit: 2022-12-01 | Discharge: 2022-12-01 | Disposition: A | Discharge status: Home / Self Care | Payer: Medicaid Other | Source: Ambulatory Visit | Attending: Acute Care | Admitting: Acute Care

## 2022-12-01 DIAGNOSIS — Z122 Encounter for screening for malignant neoplasm of respiratory organs: Secondary | ICD-10-CM | POA: Diagnosis present

## 2022-12-01 DIAGNOSIS — F17210 Nicotine dependence, cigarettes, uncomplicated: Secondary | ICD-10-CM | POA: Insufficient documentation

## 2022-12-01 DIAGNOSIS — Z87891 Personal history of nicotine dependence: Secondary | ICD-10-CM | POA: Diagnosis present

## 2022-12-01 DIAGNOSIS — I639 Cerebral infarction, unspecified: Secondary | ICD-10-CM | POA: Diagnosis not present

## 2022-12-02 DIAGNOSIS — I639 Cerebral infarction, unspecified: Secondary | ICD-10-CM | POA: Diagnosis not present

## 2022-12-03 DIAGNOSIS — I639 Cerebral infarction, unspecified: Secondary | ICD-10-CM | POA: Diagnosis not present

## 2022-12-04 DIAGNOSIS — I639 Cerebral infarction, unspecified: Secondary | ICD-10-CM | POA: Diagnosis not present

## 2022-12-05 DIAGNOSIS — I639 Cerebral infarction, unspecified: Secondary | ICD-10-CM | POA: Diagnosis not present

## 2022-12-06 DIAGNOSIS — I639 Cerebral infarction, unspecified: Secondary | ICD-10-CM | POA: Diagnosis not present

## 2022-12-07 DIAGNOSIS — I639 Cerebral infarction, unspecified: Secondary | ICD-10-CM | POA: Diagnosis not present

## 2022-12-08 ENCOUNTER — Ambulatory Visit (INDEPENDENT_AMBULATORY_CARE_PROVIDER_SITE_OTHER): Payer: Medicaid Other | Admitting: Gastroenterology

## 2022-12-08 ENCOUNTER — Encounter: Payer: Self-pay | Admitting: *Deleted

## 2022-12-08 ENCOUNTER — Encounter (INDEPENDENT_AMBULATORY_CARE_PROVIDER_SITE_OTHER): Payer: Self-pay | Admitting: Gastroenterology

## 2022-12-08 VITALS — BP 149/97 | HR 62 | Temp 97.8°F | Ht 72.0 in | Wt 192.4 lb

## 2022-12-08 DIAGNOSIS — K8050 Calculus of bile duct without cholangitis or cholecystitis without obstruction: Secondary | ICD-10-CM

## 2022-12-08 DIAGNOSIS — K5900 Constipation, unspecified: Secondary | ICD-10-CM

## 2022-12-08 DIAGNOSIS — K5904 Chronic idiopathic constipation: Secondary | ICD-10-CM | POA: Insufficient documentation

## 2022-12-08 DIAGNOSIS — Z1211 Encounter for screening for malignant neoplasm of colon: Secondary | ICD-10-CM | POA: Insufficient documentation

## 2022-12-08 DIAGNOSIS — K219 Gastro-esophageal reflux disease without esophagitis: Secondary | ICD-10-CM

## 2022-12-08 DIAGNOSIS — K8020 Calculus of gallbladder without cholecystitis without obstruction: Secondary | ICD-10-CM | POA: Diagnosis not present

## 2022-12-08 DIAGNOSIS — R1310 Dysphagia, unspecified: Secondary | ICD-10-CM

## 2022-12-08 DIAGNOSIS — R1319 Other dysphagia: Secondary | ICD-10-CM | POA: Insufficient documentation

## 2022-12-08 DIAGNOSIS — I639 Cerebral infarction, unspecified: Secondary | ICD-10-CM | POA: Diagnosis not present

## 2022-12-08 NOTE — Patient Instructions (Signed)
It was very nice to meet you today, as dicussed with will plan for the following :  1) xray-esophagram  2) Surgery referral
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
## 2022-12-09 DIAGNOSIS — I639 Cerebral infarction, unspecified: Secondary | ICD-10-CM | POA: Diagnosis not present

## 2022-12-10 DIAGNOSIS — I639 Cerebral infarction, unspecified: Secondary | ICD-10-CM | POA: Diagnosis not present

## 2022-12-11 DIAGNOSIS — I639 Cerebral infarction, unspecified: Secondary | ICD-10-CM | POA: Diagnosis not present

## 2022-12-12 DIAGNOSIS — I639 Cerebral infarction, unspecified: Secondary | ICD-10-CM | POA: Diagnosis not present

## 2022-12-13 DIAGNOSIS — I639 Cerebral infarction, unspecified: Secondary | ICD-10-CM | POA: Diagnosis not present

## 2022-12-14 DIAGNOSIS — I639 Cerebral infarction, unspecified: Secondary | ICD-10-CM | POA: Diagnosis not present

## 2022-12-15 DIAGNOSIS — I639 Cerebral infarction, unspecified: Secondary | ICD-10-CM | POA: Diagnosis not present

## 2022-12-16 ENCOUNTER — Encounter: Payer: Self-pay | Admitting: Urology

## 2022-12-16 ENCOUNTER — Ambulatory Visit: Payer: Medicaid Other | Admitting: Urology

## 2022-12-16 VITALS — BP 133/84 | HR 70 | Ht 72.0 in | Wt 194.0 lb

## 2022-12-16 DIAGNOSIS — N390 Urinary tract infection, site not specified: Secondary | ICD-10-CM

## 2022-12-16 DIAGNOSIS — Z09 Encounter for follow-up examination after completed treatment for conditions other than malignant neoplasm: Secondary | ICD-10-CM

## 2022-12-16 DIAGNOSIS — Z87440 Personal history of urinary (tract) infections: Secondary | ICD-10-CM

## 2022-12-16 DIAGNOSIS — I639 Cerebral infarction, unspecified: Secondary | ICD-10-CM | POA: Diagnosis not present

## 2022-12-16 DIAGNOSIS — R300 Dysuria: Secondary | ICD-10-CM

## 2022-12-16 LAB — URINALYSIS, ROUTINE W REFLEX MICROSCOPIC
Bilirubin, UA: NEGATIVE
Glucose, UA: NEGATIVE
Ketones, UA: NEGATIVE
Leukocytes,UA: NEGATIVE
Nitrite, UA: NEGATIVE
Protein,UA: NEGATIVE
RBC, UA: NEGATIVE
Specific Gravity, UA: 1.01 (ref 1.005–1.030)
Urobilinogen, Ur: 0.2 mg/dL (ref 0.2–1.0)
pH, UA: 6.5 (ref 5.0–7.5)

## 2022-12-16 MED ORDER — NITROFURANTOIN MACROCRYSTAL 50 MG PO CAPS
50.0000 mg | ORAL_CAPSULE | Freq: Every day | ORAL | 2 refills | Status: DC
Start: 2022-12-16 — End: 2023-03-30

## 2022-12-16 NOTE — Addendum Note (Signed)
Addended by: Milderd Meager on: 12/16/2022 01:37 PM   Modules accepted: Orders
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
## 2022-12-17 ENCOUNTER — Ambulatory Visit (HOSPITAL_COMMUNITY): Payer: Medicaid Other

## 2022-12-17 DIAGNOSIS — I639 Cerebral infarction, unspecified: Secondary | ICD-10-CM | POA: Diagnosis not present

## 2022-12-18 DIAGNOSIS — I639 Cerebral infarction, unspecified: Secondary | ICD-10-CM | POA: Diagnosis not present

## 2022-12-18 LAB — URINE CULTURE

## 2022-12-19 DIAGNOSIS — I639 Cerebral infarction, unspecified: Secondary | ICD-10-CM | POA: Diagnosis not present

## 2022-12-20 ENCOUNTER — Telehealth (INDEPENDENT_AMBULATORY_CARE_PROVIDER_SITE_OTHER): Payer: Self-pay | Admitting: Gastroenterology

## 2022-12-20 ENCOUNTER — Ambulatory Visit (HOSPITAL_COMMUNITY)
Admission: RE | Admit: 2022-12-20 | Discharge: 2022-12-20 | Disposition: A | Discharge status: Home / Self Care | Payer: Medicaid Other | Source: Ambulatory Visit | Attending: Gastroenterology | Admitting: Gastroenterology

## 2022-12-20 ENCOUNTER — Encounter: Payer: Self-pay | Admitting: Urology

## 2022-12-20 DIAGNOSIS — R1319 Other dysphagia: Secondary | ICD-10-CM

## 2022-12-20 DIAGNOSIS — R1312 Dysphagia, oropharyngeal phase: Secondary | ICD-10-CM

## 2022-12-20 DIAGNOSIS — I639 Cerebral infarction, unspecified: Secondary | ICD-10-CM | POA: Diagnosis not present

## 2022-12-20 NOTE — Addendum Note (Signed)
Addended by: Marlowe Shores on: 12/20/2022 04:12 PM   Modules accepted: Orders

## 2022-12-20 NOTE — Telephone Encounter (Signed)
Alice [name], please schedule modified barium swallow. Patient states Thursdays and Fridays are best for her.   Discussed with patient per Dr. Tasia Catchings  the esophagogram showed moderate to severe esophageal dysmotility , Small amount of gastroesophageal reflux.   Dr. Tasia Catchings recommends:    -Protonix 40 mg 30 minutes before breakfast -Because of difficulty swallowing is primarily with liquids and also radiologist suggest this, I recommend -modified barium swallow in which the swallowing mechanism can be assessed Continue to follow with [place] in GI clinic   Patient verbalized understanding.

## 2022-12-20 NOTE — Telephone Encounter (Signed)
Called [employer], it has been adjusted in the system as a 30 day supply. Pt should be able to pick up the full 30 days after 12/20/22. Pt notified. Call reference # [id number].

## 2022-12-20 NOTE — Telephone Encounter (Signed)
Reviewed esophagram results:  Hi Alice [name] ,  Can you please call the patient and tell the patient the esophagogram showed moderate to severe esophageal dysmotility , Small amount of gastroesophageal reflux.   I recommend:  -Protonix 40 mg 30 minutes before breakfast -Because of difficulty swallowing is primarily with liquids and also radiologist suggest this, I recommend -modified barium swallow in which the swallowing mechanism can be assessed Continue to follow with [place] in GI clinic    Thanks,  Vista Lawman, MD Gastroenterology and Hepatology [facility] Gastroenterology  ================  May need to be referred for [facility] in future

## 2022-12-21 ENCOUNTER — Telehealth (INDEPENDENT_AMBULATORY_CARE_PROVIDER_SITE_OTHER): Payer: Medicaid Other | Admitting: Family Medicine

## 2022-12-21 ENCOUNTER — Encounter: Payer: Self-pay | Admitting: Family Medicine

## 2022-12-21 DIAGNOSIS — J449 Chronic obstructive pulmonary disease, unspecified: Secondary | ICD-10-CM

## 2022-12-21 DIAGNOSIS — K2100 Gastro-esophageal reflux disease with esophagitis, without bleeding: Secondary | ICD-10-CM | POA: Diagnosis not present

## 2022-12-21 DIAGNOSIS — R7303 Prediabetes: Secondary | ICD-10-CM

## 2022-12-21 DIAGNOSIS — E559 Vitamin D deficiency, unspecified: Secondary | ICD-10-CM | POA: Diagnosis not present

## 2022-12-21 DIAGNOSIS — E785 Hyperlipidemia, unspecified: Secondary | ICD-10-CM | POA: Diagnosis not present

## 2022-12-21 DIAGNOSIS — I10 Essential (primary) hypertension: Secondary | ICD-10-CM

## 2022-12-21 DIAGNOSIS — I639 Cerebral infarction, unspecified: Secondary | ICD-10-CM | POA: Diagnosis not present

## 2022-12-21 MED ORDER — LOSARTAN POTASSIUM 25 MG PO TABS
25.0000 mg | ORAL_TABLET | Freq: Every day | ORAL | 1 refills | Status: DC
Start: 2022-12-21 — End: 2023-06-16

## 2022-12-21 NOTE — Telephone Encounter (Signed)
Referral sent, they will contact patient with apt

## 2022-12-21 NOTE — Progress Notes (Signed)
Virtual Visit via Video   I connected with patient on 12/21/22 at 1200 by a video enabled telemedicine application and verified that I am speaking with the correct person using two identifiers.  Location patient: Home Location provider: [facility] Persons participating in the virtual visit: Patient and Provider  I discussed the limitations of evaluation and management by telemedicine and the availability of in person appointments. The patient expressed understanding and agreed to proceed.  Subjective:   HPI:  Pt presents today for  Chief Complaint  Patient presents with   Medical Management of Chronic Issues   Discussed the use of AI scribe software for clinical note transcription with the patient, who gave verbal consent to proceed.  History of Present Illness   The patient presents with a chief complaint of difficulty swallowing, particularly with liquids, describing it as an "air pocket" that prevents the food from going down. This often results in regurgitation. She recently underwent a test which revealed a moderate to severe esophageal stricture.  In addition to the swallowing issue, the patient reports a recent increase in blood pressure, with readings around 140-150, higher than her usual 120. She was previously taken off her blood pressure medication due to a potential side effect of coughing.  The patient also reports urinary symptoms, including painful urination and frequent, small-volume urination. She describes the pain as severe, particularly towards the end of the stream. Despite negative office tests for urinary tract infection, the patient was placed on a low-dose antibiotic due to persistent symptoms by urology and she has follow up with them scheduled.   Lastly, the patient mentions a recent prediabetes diagnosis and has made dietary changes, including eliminating real sugar from her diet, in an attempt to manage this condition. She  denies any symptoms of increased thirst, hunger, or urination.      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 01/15/2022   GERD (gastroesophageal reflux disease) 07/24/2021   Depression, major, single episode, moderate (HCC) 10/22/2020   GAD (generalized anxiety disorder) 11/01/2018   Vitamin D deficiency 11/01/2018   H/o Prior ischemic stroke (HCC) 09/03/2016   History of stroke 05/09/2016   History of substance abuse (HCC) 05/09/2016   Heart murmur 01/08/2016   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   Dyslipidemia 10/04/2015   HTN (hypertension) 10/04/2015   Discitis 10/04/2015   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/08/2015   Postlaminectomy syndrome, lumbar region 12/04/2012   Anemia 06/17/2012   Chronic low back pain 06/17/2012   DDD (degenerative disc disease), lumbosacral 06/17/2012   Sacroiliitis (HCC) 06/17/2012   Precordial pain 02/18/2010   Cigarette smoker 12/05/2009   Anxiety state 12/05/2009   Palpitations 12/05/2009    Social History   Tobacco Use   Smoking status: Every Day    Current packs/day: 0.50    Average packs/day: 0.5 packs/day for 40.0 years (20.0 ttl pk-yrs)    Types: Cigarettes    Passive exposure: Current   Smokeless tobacco: Never  Substance Use Topics   Alcohol use: Not Currently    Comment:  today    Current Outpatient Medications:    losartan (COZAAR) 25 MG tablet, Take 1 tablet (25 mg total) by mouth daily., Disp: 90 tablet, Rfl: 1   aspirin EC 81 MG tablet, Take 1 tablet (81 mg total) by mouth  daily with breakfast. For Stroke Prevention, Disp: 30 tablet, Rfl: 11   atorvastatin (LIPITOR) 40 MG tablet, Take 1 tablet (40 mg total) by mouth daily., Disp: 90 tablet, Rfl: 1   baclofen (LIORESAL) 10 MG tablet, Take 1 tablet (10 mg total) by mouth 2 (two) times daily., Disp: 60 tablet, Rfl: 3   clotrimazole (LOTRIMIN) 1 % cream, Apply to affected area 2 times daily, Disp: 15 g, Rfl: 0   famotidine (PEPCID) 20 MG tablet, One after supper (Patient taking differently: Take 20 mg by mouth daily. One after supper), Disp: 30 tablet, Rfl: 11   gabapentin (NEURONTIN) 100 MG capsule, Take 1 capsule (100 mg total) by mouth 4 (four) times daily., Disp: 120 capsule, Rfl: 2   nitrofurantoin (MACRODANTIN) 50 MG capsule, Take 1 capsule (50 mg total) by mouth daily., Disp: 30 capsule, Rfl: 2   pantoprazole (PROTONIX) 40 MG tablet, TAKE 1 TABLET BY MOUTH EVERY DAY, Disp: 90 tablet, Rfl: 0   polyethylene glycol powder (GLYCOLAX/MIRALAX) 17 GM/SCOOP powder, Take 17 g by mouth daily., Disp: 3350 g, Rfl: 1   SUBOXONE 8-2 MG FILM, Place 0.5 Film under the tongue every 6 (six) hours., Disp: , Rfl:   Allergies  Allergen Reactions   Benadryl [Diphenhydramine Hcl] Hives   Codeine Rash   Tetracycline Itching and Rash    Objective:   LMP 07/30/2011   Patient is well-developed, well-nourished in no acute distress.  Resting comfortably at home.  Head is normocephalic, atraumatic.  No labored breathing.  Speech is clear and coherent with logical content.  Patient is alert and oriented at baseline.   Assessment and Plan:   Alice [name] was seen today for medical management of chronic issues.  Diagnoses and all orders for this visit:  Gastroesophageal reflux disease with esophagitis without hemorrhage -     CBC with Differential/Platelet; Future  Primary hypertension -     CMP14+EGFR; Future -     CBC with Differential/Platelet; Future -     Lipid panel; Future -     Thyroid Panel With TSH; Future -      losartan (COZAAR) 25 MG tablet; Take 1 tablet (25 mg total) by mouth daily.  COPD mixed type (HCC) -     CBC with Differential/Platelet; Future  Dyslipidemia -     CMP14+EGFR; Future -     Lipid panel; Future  Vitamin D deficiency -     VITAMIN D 25 Hydroxy (Vit-D Deficiency, Fractures); Future  Prediabetes -     CMP14+EGFR; Future -     CBC with Differential/Platelet; Future -     Lipid panel; Future -     Thyroid Panel With TSH; Future -     VITAMIN D 25 Hydroxy (Vit-D Deficiency, Fractures); Future -     Bayer DCA Hb A1c Waived; Future    Assessment and Plan    Esophageal Stricture Moderate to severe esophageal stricture causing dysphagia with liquids, leading to regurgitation. Awaiting further tests and EGD. Discussed EGD risks and benefits, including symptom relief and risks of perforation and infection. - Await test results - Schedule EGD  Hypertension Blood pressure 140-150 mmHg. Lisinopril discontinued due to cough. Plan to initiate low-dose losartan. Discussed losartan's lower cough risk and potential  side effects, including dizziness and hyperkalemia. Emphasized regular potassium monitoring. - Prescribe low-dose losartan - Order potassium monitoring labs  Prediabetes Prediabetic, implementing dietary changes (avoiding sugar, using substitutes). No polyuria, polydipsia, or polyphagia. Emphasized continued dietary modifications and regular glucose monitoring to prevent diabetes progression. - Continue dietary modifications - Monitor blood glucose  Urinary Symptoms Intermittent dysuria and frequency with negative office tests but positive sent-off tests. On antibiotics since December 5th. Advised continuation of antibiotics and monitoring. Follow-up with urologist if symptoms persist. - Continue antibiotics - Monitor symptoms, follow up with urologist if needed  Follow-up - Schedule lab work for Thursday - Follow up with primary care provider on Thursday.         Return in about 6 months (around 06/21/2023), or if symptoms worsen or fail to improve, for chronic follow up.  Kari Baars, FNP-C [facility] [address] [place], [place] [address] [phone]  12/21/2022  Time spent with the patient: 20 minutes, of which >50% was spent in obtaining information about symptoms, reviewing previous labs, evaluations, and treatments, counseling about condition (please see the discussed topics above), and developing a plan to further investigate it; had a number of questions which I addressed.

## 2022-12-22 DIAGNOSIS — I639 Cerebral infarction, unspecified: Secondary | ICD-10-CM | POA: Diagnosis not present

## 2022-12-23 ENCOUNTER — Other Ambulatory Visit: Payer: Medicaid Other

## 2022-12-23 DIAGNOSIS — E785 Hyperlipidemia, unspecified: Secondary | ICD-10-CM

## 2022-12-23 DIAGNOSIS — J449 Chronic obstructive pulmonary disease, unspecified: Secondary | ICD-10-CM | POA: Diagnosis not present

## 2022-12-23 DIAGNOSIS — R7303 Prediabetes: Secondary | ICD-10-CM | POA: Diagnosis not present

## 2022-12-23 DIAGNOSIS — I639 Cerebral infarction, unspecified: Secondary | ICD-10-CM | POA: Diagnosis not present

## 2022-12-23 DIAGNOSIS — I10 Essential (primary) hypertension: Secondary | ICD-10-CM | POA: Diagnosis not present

## 2022-12-23 DIAGNOSIS — K2100 Gastro-esophageal reflux disease with esophagitis, without bleeding: Secondary | ICD-10-CM | POA: Diagnosis not present

## 2022-12-23 DIAGNOSIS — E559 Vitamin D deficiency, unspecified: Secondary | ICD-10-CM | POA: Diagnosis not present

## 2022-12-23 LAB — BAYER DCA HB A1C WAIVED: HB A1C (BAYER DCA - WAIVED): 5.5 % (ref 4.8–5.6)

## 2022-12-24 DIAGNOSIS — I639 Cerebral infarction, unspecified: Secondary | ICD-10-CM | POA: Diagnosis not present

## 2022-12-24 LAB — CMP14+EGFR
ALT: 11 [IU]/L (ref 0–32)
AST: 17 [IU]/L (ref 0–40)
Albumin: 4.4 g/dL (ref 3.8–4.9)
Alkaline Phosphatase: 127 [IU]/L — ABNORMAL HIGH (ref 44–121)
BUN/Creatinine Ratio: 11 (ref 9–23)
BUN: 8 mg/dL (ref 6–24)
Bilirubin Total: 0.4 mg/dL (ref 0.0–1.2)
CO2: 26 mmol/L (ref 20–29)
Calcium: 9.6 mg/dL (ref 8.7–10.2)
Chloride: 102 mmol/L (ref 96–106)
Creatinine, Ser: 0.75 mg/dL (ref 0.57–1.00)
Globulin, Total: 2.6 g/dL (ref 1.5–4.5)
Glucose: 123 mg/dL — ABNORMAL HIGH (ref 70–99)
Potassium: 4.6 mmol/L (ref 3.5–5.2)
Sodium: 140 mmol/L (ref 134–144)
Total Protein: 7 g/dL (ref 6.0–8.5)
eGFR: 93 mL/min/{1.73_m2} (ref >59)

## 2022-12-24 LAB — LIPID PANEL
Chol/HDL Ratio: 2.4 {ratio} (ref 0.0–4.4)
Cholesterol, Total: 116 mg/dL (ref 100–199)
HDL: 49 mg/dL (ref >39)
LDL Chol Calc (NIH): 55 mg/dL (ref 0–99)
Triglycerides: 54 mg/dL (ref 0–149)
VLDL Cholesterol Cal: 12 mg/dL (ref 5–40)

## 2022-12-24 LAB — CBC WITH DIFFERENTIAL/PLATELET
Basophils Absolute: 0 10*3/uL (ref 0.0–0.2)
Basos: 1 %
EOS (ABSOLUTE): 0.3 10*3/uL (ref 0.0–0.4)
Eos: 4 %
Hematocrit: 38 % (ref 34.0–46.6)
Hemoglobin: 12.3 g/dL (ref 11.1–15.9)
Immature Grans (Abs): 0 10*3/uL (ref 0.0–0.1)
Immature Granulocytes: 0 %
Lymphocytes Absolute: 1.7 10*3/uL (ref 0.7–3.1)
Lymphs: 28 %
MCH: 30.3 pg (ref 26.6–33.0)
MCHC: 32.4 g/dL (ref 31.5–35.7)
MCV: 94 fL (ref 79–97)
Monocytes Absolute: 0.5 10*3/uL (ref 0.1–0.9)
Monocytes: 7 %
Neutrophils Absolute: 3.7 10*3/uL (ref 1.4–7.0)
Neutrophils: 60 %
Platelets: 200 10*3/uL (ref 150–450)
RBC: 4.06 x10E6/uL (ref 3.77–5.28)
RDW: 11.9 % (ref 11.7–15.4)
WBC: 6.2 10*3/uL (ref 3.4–10.8)

## 2022-12-24 LAB — THYROID PANEL WITH TSH
Free Thyroxine Index: 2.5 (ref 1.2–4.9)
T3 Uptake Ratio: 26 % (ref 24–39)
T4, Total: 9.6 ug/dL (ref 4.5–12.0)
TSH: 2.56 u[IU]/mL (ref 0.450–4.500)

## 2022-12-24 LAB — VITAMIN D 25 HYDROXY (VIT D DEFICIENCY, FRACTURES): Vit D, 25-Hydroxy: 26.3 ng/mL — ABNORMAL LOW (ref 30.0–100.0)

## 2022-12-25 ENCOUNTER — Encounter: Payer: Self-pay | Admitting: Family Medicine

## 2022-12-25 DIAGNOSIS — I639 Cerebral infarction, unspecified: Secondary | ICD-10-CM | POA: Diagnosis not present

## 2022-12-26 DIAGNOSIS — I639 Cerebral infarction, unspecified: Secondary | ICD-10-CM | POA: Diagnosis not present

## 2022-12-27 DIAGNOSIS — I639 Cerebral infarction, unspecified: Secondary | ICD-10-CM | POA: Diagnosis not present

## 2022-12-28 DIAGNOSIS — I639 Cerebral infarction, unspecified: Secondary | ICD-10-CM | POA: Diagnosis not present

## 2022-12-29 DIAGNOSIS — I639 Cerebral infarction, unspecified: Secondary | ICD-10-CM | POA: Diagnosis not present

## 2022-12-30 ENCOUNTER — Ambulatory Visit (HOSPITAL_COMMUNITY)
Admission: RE | Admit: 2022-12-30 | Discharge: 2022-12-30 | Disposition: A | Discharge status: Home / Self Care | Payer: Medicaid Other | Source: Ambulatory Visit | Attending: Internal Medicine | Admitting: Internal Medicine

## 2022-12-30 ENCOUNTER — Telehealth: Payer: Self-pay | Admitting: *Deleted

## 2022-12-30 DIAGNOSIS — Z87891 Personal history of nicotine dependence: Secondary | ICD-10-CM

## 2022-12-30 DIAGNOSIS — I639 Cerebral infarction, unspecified: Secondary | ICD-10-CM | POA: Diagnosis not present

## 2022-12-30 DIAGNOSIS — J449 Chronic obstructive pulmonary disease, unspecified: Secondary | ICD-10-CM | POA: Insufficient documentation

## 2022-12-30 DIAGNOSIS — R911 Solitary pulmonary nodule: Secondary | ICD-10-CM

## 2022-12-30 LAB — PULMONARY FUNCTION TEST
DL/VA % pred: 91 %
DL/VA: 3.68 ml/min/mmHg/L
DLCO unc % pred: 69 %
DLCO unc: 18.11 ml/min/mmHg
FEF 25-75 Post: 1.87 L/s
FEF 25-75 Pre: 2.38 L/s
FEF2575-%Change-Post: -21 %
FEF2575-%Pred-Post: 63 %
FEF2575-%Pred-Pre: 80 %
FEV1-%Change-Post: -9 %
FEV1-%Pred-Post: 68 %
FEV1-%Pred-Pre: 75 %
FEV1-Post: 2.36 L
FEV1-Pre: 2.62 L
FEV1FVC-%Change-Post: -7 %
FEV1FVC-%Pred-Pre: 98 %
FEV6-%Change-Post: -4 %
FEV6-%Pred-Post: 73 %
FEV6-%Pred-Pre: 77 %
FEV6-Post: 3.15 L
FEV6-Pre: 3.3 L
FEV6FVC-%Change-Post: 2 %
FEV6FVC-%Pred-Post: 103 %
FEV6FVC-%Pred-Pre: 100 %
FVC-%Change-Post: -2 %
FVC-%Pred-Post: 74 %
FVC-%Pred-Pre: 76 %
FVC-Post: 3.29 L
FVC-Pre: 3.38 L
Post FEV1/FVC ratio: 72 %
Post FEV6/FVC ratio: 100 %
Pre FEV1/FVC ratio: 77 %
Pre FEV6/FVC Ratio: 98 %
RV % pred: 74 %
RV: 1.73 L
TLC % pred: 82 %
TLC: 5.18 L

## 2022-12-30 MED ORDER — ALBUTEROL SULFATE (2.5 MG/3ML) 0.083% IN NEBU
2.5000 mg | INHALATION_SOLUTION | Freq: Once | RESPIRATORY_TRACT | Status: AC
  Administered 2022-12-30: 2.5 mg via RESPIRATORY_TRACT

## 2022-12-30 NOTE — Telephone Encounter (Signed)
Spoke with pt and advised of small nodule seen on lung screening CT that we would like to look at again in 6 months with a repeat CT.  Also noted was coronary calcifications. Pt on Lipitor daily.  Pt aware we will call her closer to 6 months to schedule next scan. Results/ plans sent to PCP.

## 2022-12-31 DIAGNOSIS — I639 Cerebral infarction, unspecified: Secondary | ICD-10-CM | POA: Diagnosis not present

## 2023-01-01 DIAGNOSIS — I639 Cerebral infarction, unspecified: Secondary | ICD-10-CM | POA: Diagnosis not present

## 2023-01-02 DIAGNOSIS — I639 Cerebral infarction, unspecified: Secondary | ICD-10-CM | POA: Diagnosis not present

## 2023-01-03 DIAGNOSIS — I639 Cerebral infarction, unspecified: Secondary | ICD-10-CM | POA: Diagnosis not present

## 2023-01-04 DIAGNOSIS — I639 Cerebral infarction, unspecified: Secondary | ICD-10-CM | POA: Diagnosis not present

## 2023-01-06 DIAGNOSIS — I639 Cerebral infarction, unspecified: Secondary | ICD-10-CM | POA: Diagnosis not present

## 2023-01-07 DIAGNOSIS — I639 Cerebral infarction, unspecified: Secondary | ICD-10-CM | POA: Diagnosis not present

## 2023-01-08 DIAGNOSIS — I639 Cerebral infarction, unspecified: Secondary | ICD-10-CM | POA: Diagnosis not present

## 2023-01-09 DIAGNOSIS — I639 Cerebral infarction, unspecified: Secondary | ICD-10-CM | POA: Diagnosis not present

## 2023-01-10 ENCOUNTER — Encounter: Payer: Self-pay | Admitting: Family Medicine

## 2023-01-10 DIAGNOSIS — I639 Cerebral infarction, unspecified: Secondary | ICD-10-CM | POA: Diagnosis not present

## 2023-01-11 ENCOUNTER — Other Ambulatory Visit: Payer: Self-pay | Admitting: Family Medicine

## 2023-01-11 DIAGNOSIS — K219 Gastro-esophageal reflux disease without esophagitis: Secondary | ICD-10-CM

## 2023-01-12 DIAGNOSIS — I639 Cerebral infarction, unspecified: Secondary | ICD-10-CM | POA: Diagnosis not present

## 2023-01-13 ENCOUNTER — Other Ambulatory Visit: Payer: Self-pay | Admitting: Family Medicine

## 2023-01-13 DIAGNOSIS — I639 Cerebral infarction, unspecified: Secondary | ICD-10-CM | POA: Diagnosis not present

## 2023-01-13 DIAGNOSIS — E782 Mixed hyperlipidemia: Secondary | ICD-10-CM

## 2023-01-14 ENCOUNTER — Encounter: Payer: Self-pay | Admitting: Family Medicine

## 2023-01-14 ENCOUNTER — Telehealth: Payer: Self-pay

## 2023-01-14 ENCOUNTER — Other Ambulatory Visit: Payer: Self-pay | Admitting: Family Medicine

## 2023-01-14 DIAGNOSIS — I639 Cerebral infarction, unspecified: Secondary | ICD-10-CM | POA: Diagnosis not present

## 2023-01-14 DIAGNOSIS — L282 Other prurigo: Secondary | ICD-10-CM

## 2023-01-14 MED ORDER — KETOCONAZOLE 2 % EX CREA: 1.0000 | TOPICAL_CREAM | Freq: Every day | CUTANEOUS | 0 refills | Status: DC

## 2023-01-14 NOTE — Telephone Encounter (Signed)
 We have received a fax from [facility] who supplies patient with their incontinence supplies.  They sent the form to order the supplies for patient but they also need office visit notes that specifically mention the patients need for incontinence supplies.  Patient had an appointment with you on 12/21/22, can you addend those notes to include this information and let Donny know when this has been completed so she can fax notes to the supplier.

## 2023-01-15 DIAGNOSIS — I639 Cerebral infarction, unspecified: Secondary | ICD-10-CM | POA: Diagnosis not present

## 2023-01-16 ENCOUNTER — Encounter: Payer: Self-pay | Admitting: General Surgery

## 2023-01-17 DIAGNOSIS — I639 Cerebral infarction, unspecified: Secondary | ICD-10-CM | POA: Diagnosis not present

## 2023-01-17 NOTE — Telephone Encounter (Signed)
 OV notes and order has been faxed to [employer] at [phone]

## 2023-01-18 ENCOUNTER — Ambulatory Visit: Payer: Medicaid Other | Admitting: General Surgery

## 2023-01-18 DIAGNOSIS — I639 Cerebral infarction, unspecified: Secondary | ICD-10-CM | POA: Diagnosis not present

## 2023-01-18 DIAGNOSIS — J449 Chronic obstructive pulmonary disease, unspecified: Secondary | ICD-10-CM | POA: Diagnosis not present

## 2023-01-18 DIAGNOSIS — R32 Unspecified urinary incontinence: Secondary | ICD-10-CM | POA: Diagnosis not present

## 2023-01-19 DIAGNOSIS — I639 Cerebral infarction, unspecified: Secondary | ICD-10-CM | POA: Diagnosis not present

## 2023-01-20 DIAGNOSIS — I639 Cerebral infarction, unspecified: Secondary | ICD-10-CM | POA: Diagnosis not present

## 2023-01-21 DIAGNOSIS — I639 Cerebral infarction, unspecified: Secondary | ICD-10-CM | POA: Diagnosis not present

## 2023-01-22 DIAGNOSIS — I639 Cerebral infarction, unspecified: Secondary | ICD-10-CM | POA: Diagnosis not present

## 2023-01-24 DIAGNOSIS — I639 Cerebral infarction, unspecified: Secondary | ICD-10-CM | POA: Diagnosis not present

## 2023-01-25 ENCOUNTER — Ambulatory Visit (INDEPENDENT_AMBULATORY_CARE_PROVIDER_SITE_OTHER): Payer: Medicaid Other | Admitting: General Surgery

## 2023-01-25 ENCOUNTER — Encounter: Payer: Self-pay | Admitting: General Surgery

## 2023-01-25 VITALS — BP 127/85 | HR 69 | Temp 97.8°F | Resp 14 | Ht 72.0 in | Wt 197.0 lb

## 2023-01-25 DIAGNOSIS — K8020 Calculus of gallbladder without cholecystitis without obstruction: Secondary | ICD-10-CM

## 2023-01-25 DIAGNOSIS — I639 Cerebral infarction, unspecified: Secondary | ICD-10-CM | POA: Diagnosis not present
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
## 2023-01-26 DIAGNOSIS — I639 Cerebral infarction, unspecified: Secondary | ICD-10-CM | POA: Diagnosis not present

## 2023-01-27 DIAGNOSIS — I639 Cerebral infarction, unspecified: Secondary | ICD-10-CM | POA: Diagnosis not present

## 2023-01-28 DIAGNOSIS — I639 Cerebral infarction, unspecified: Secondary | ICD-10-CM | POA: Diagnosis not present

## 2023-01-29 DIAGNOSIS — I639 Cerebral infarction, unspecified: Secondary | ICD-10-CM | POA: Diagnosis not present

## 2023-01-31 DIAGNOSIS — I639 Cerebral infarction, unspecified: Secondary | ICD-10-CM | POA: Diagnosis not present

## 2023-02-01 DIAGNOSIS — I639 Cerebral infarction, unspecified: Secondary | ICD-10-CM | POA: Diagnosis not present

## 2023-02-02 DIAGNOSIS — I639 Cerebral infarction, unspecified: Secondary | ICD-10-CM | POA: Diagnosis not present

## 2023-02-03 DIAGNOSIS — I639 Cerebral infarction, unspecified: Secondary | ICD-10-CM | POA: Diagnosis not present

## 2023-02-04 DIAGNOSIS — I639 Cerebral infarction, unspecified: Secondary | ICD-10-CM | POA: Diagnosis not present

## 2023-02-05 DIAGNOSIS — I639 Cerebral infarction, unspecified: Secondary | ICD-10-CM | POA: Diagnosis not present

## 2023-02-07 DIAGNOSIS — I639 Cerebral infarction, unspecified: Secondary | ICD-10-CM | POA: Diagnosis not present

## 2023-02-08 DIAGNOSIS — I639 Cerebral infarction, unspecified: Secondary | ICD-10-CM | POA: Diagnosis not present

## 2023-02-09 DIAGNOSIS — I639 Cerebral infarction, unspecified: Secondary | ICD-10-CM | POA: Diagnosis not present

## 2023-02-10 DIAGNOSIS — I639 Cerebral infarction, unspecified: Secondary | ICD-10-CM | POA: Diagnosis not present

## 2023-02-11 DIAGNOSIS — I639 Cerebral infarction, unspecified: Secondary | ICD-10-CM | POA: Diagnosis not present

## 2023-02-12 DIAGNOSIS — I639 Cerebral infarction, unspecified: Secondary | ICD-10-CM | POA: Diagnosis not present

## 2023-02-13 DIAGNOSIS — I639 Cerebral infarction, unspecified: Secondary | ICD-10-CM | POA: Diagnosis not present

## 2023-02-14 DIAGNOSIS — I639 Cerebral infarction, unspecified: Secondary | ICD-10-CM | POA: Diagnosis not present

## 2023-02-15 ENCOUNTER — Encounter: Payer: Self-pay | Admitting: General Surgery

## 2023-02-15 ENCOUNTER — Encounter: Payer: Self-pay | Admitting: Family Medicine

## 2023-02-15 DIAGNOSIS — I639 Cerebral infarction, unspecified: Secondary | ICD-10-CM | POA: Diagnosis not present

## 2023-02-15 DIAGNOSIS — Z1231 Encounter for screening mammogram for malignant neoplasm of breast: Secondary | ICD-10-CM

## 2023-02-16 ENCOUNTER — Encounter (INDEPENDENT_AMBULATORY_CARE_PROVIDER_SITE_OTHER): Payer: Self-pay | Admitting: Gastroenterology

## 2023-02-16 DIAGNOSIS — I639 Cerebral infarction, unspecified: Secondary | ICD-10-CM | POA: Diagnosis not present

## 2023-02-16 NOTE — Addendum Note (Signed)
 Addended by: Cathalene Clipper on: 02/16/2023 09:56 AM   Modules accepted: Orders

## 2023-02-17 DIAGNOSIS — I639 Cerebral infarction, unspecified: Secondary | ICD-10-CM | POA: Diagnosis not present

## 2023-02-18 DIAGNOSIS — I639 Cerebral infarction, unspecified: Secondary | ICD-10-CM | POA: Diagnosis not present

## 2023-02-19 DIAGNOSIS — I639 Cerebral infarction, unspecified: Secondary | ICD-10-CM | POA: Diagnosis not present

## 2023-02-21 DIAGNOSIS — I639 Cerebral infarction, unspecified: Secondary | ICD-10-CM | POA: Diagnosis not present

## 2023-02-22 DIAGNOSIS — I639 Cerebral infarction, unspecified: Secondary | ICD-10-CM | POA: Diagnosis not present

## 2023-02-23 DIAGNOSIS — I639 Cerebral infarction, unspecified: Secondary | ICD-10-CM | POA: Diagnosis not present

## 2023-02-24 DIAGNOSIS — I639 Cerebral infarction, unspecified: Secondary | ICD-10-CM | POA: Diagnosis not present

## 2023-02-25 DIAGNOSIS — I639 Cerebral infarction, unspecified: Secondary | ICD-10-CM | POA: Diagnosis not present

## 2023-02-26 DIAGNOSIS — I639 Cerebral infarction, unspecified: Secondary | ICD-10-CM | POA: Diagnosis not present

## 2023-02-27 ENCOUNTER — Encounter (INDEPENDENT_AMBULATORY_CARE_PROVIDER_SITE_OTHER): Payer: Self-pay | Admitting: Gastroenterology

## 2023-02-28 DIAGNOSIS — I639 Cerebral infarction, unspecified: Secondary | ICD-10-CM | POA: Diagnosis not present

## 2023-03-01 DIAGNOSIS — I639 Cerebral infarction, unspecified: Secondary | ICD-10-CM | POA: Diagnosis not present

## 2023-03-02 DIAGNOSIS — I639 Cerebral infarction, unspecified: Secondary | ICD-10-CM | POA: Diagnosis not present

## 2023-03-03 DIAGNOSIS — I639 Cerebral infarction, unspecified: Secondary | ICD-10-CM | POA: Diagnosis not present

## 2023-03-04 DIAGNOSIS — I639 Cerebral infarction, unspecified: Secondary | ICD-10-CM | POA: Diagnosis not present

## 2023-03-05 DIAGNOSIS — I639 Cerebral infarction, unspecified: Secondary | ICD-10-CM | POA: Diagnosis not present

## 2023-03-07 ENCOUNTER — Other Ambulatory Visit: Payer: Self-pay | Admitting: Family Medicine

## 2023-03-07 DIAGNOSIS — L282 Other prurigo: Secondary | ICD-10-CM

## 2023-03-07 DIAGNOSIS — I639 Cerebral infarction, unspecified: Secondary | ICD-10-CM | POA: Diagnosis not present

## 2023-03-07 MED ORDER — KETOCONAZOLE 2 % EX CREA: 1.0000 | TOPICAL_CREAM | Freq: Every day | CUTANEOUS | 0 refills | Status: DC

## 2023-03-08 DIAGNOSIS — I639 Cerebral infarction, unspecified: Secondary | ICD-10-CM | POA: Diagnosis not present

## 2023-03-09 DIAGNOSIS — I639 Cerebral infarction, unspecified: Secondary | ICD-10-CM | POA: Diagnosis not present
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
## 2023-03-10 DIAGNOSIS — I639 Cerebral infarction, unspecified: Secondary | ICD-10-CM | POA: Diagnosis not present

## 2023-03-11 DIAGNOSIS — I639 Cerebral infarction, unspecified: Secondary | ICD-10-CM | POA: Diagnosis not present

## 2023-03-12 DIAGNOSIS — I639 Cerebral infarction, unspecified: Secondary | ICD-10-CM | POA: Diagnosis not present

## 2023-03-13 DIAGNOSIS — I639 Cerebral infarction, unspecified: Secondary | ICD-10-CM | POA: Diagnosis not present

## 2023-03-14 DIAGNOSIS — I639 Cerebral infarction, unspecified: Secondary | ICD-10-CM | POA: Diagnosis not present

## 2023-03-15 DIAGNOSIS — I639 Cerebral infarction, unspecified: Secondary | ICD-10-CM | POA: Diagnosis not present

## 2023-03-16 DIAGNOSIS — R32 Unspecified urinary incontinence: Secondary | ICD-10-CM | POA: Diagnosis not present

## 2023-03-16 DIAGNOSIS — I639 Cerebral infarction, unspecified: Secondary | ICD-10-CM | POA: Diagnosis not present

## 2023-03-16 DIAGNOSIS — J449 Chronic obstructive pulmonary disease, unspecified: Secondary | ICD-10-CM | POA: Diagnosis not present

## 2023-03-17 DIAGNOSIS — I639 Cerebral infarction, unspecified: Secondary | ICD-10-CM | POA: Diagnosis not present

## 2023-03-18 DIAGNOSIS — I639 Cerebral infarction, unspecified: Secondary | ICD-10-CM | POA: Diagnosis not present

## 2023-03-19 DIAGNOSIS — I639 Cerebral infarction, unspecified: Secondary | ICD-10-CM | POA: Diagnosis not present

## 2023-03-20 DIAGNOSIS — I639 Cerebral infarction, unspecified: Secondary | ICD-10-CM | POA: Diagnosis not present

## 2023-03-21 DIAGNOSIS — I639 Cerebral infarction, unspecified: Secondary | ICD-10-CM | POA: Diagnosis not present

## 2023-03-22 ENCOUNTER — Encounter: Payer: Self-pay | Admitting: Family Medicine

## 2023-03-22 DIAGNOSIS — I639 Cerebral infarction, unspecified: Secondary | ICD-10-CM | POA: Diagnosis not present

## 2023-03-23 DIAGNOSIS — I639 Cerebral infarction, unspecified: Secondary | ICD-10-CM | POA: Diagnosis not present

## 2023-03-24 DIAGNOSIS — I639 Cerebral infarction, unspecified: Secondary | ICD-10-CM | POA: Diagnosis not present

## 2023-03-25 ENCOUNTER — Encounter: Payer: Self-pay | Admitting: General Surgery

## 2023-03-25 DIAGNOSIS — I639 Cerebral infarction, unspecified: Secondary | ICD-10-CM | POA: Diagnosis not present

## 2023-03-26 DIAGNOSIS — I639 Cerebral infarction, unspecified: Secondary | ICD-10-CM | POA: Diagnosis not present

## 2023-03-28 DIAGNOSIS — I639 Cerebral infarction, unspecified: Secondary | ICD-10-CM | POA: Diagnosis not present

## 2023-03-29 DIAGNOSIS — I639 Cerebral infarction, unspecified: Secondary | ICD-10-CM | POA: Diagnosis not present

## 2023-03-30 ENCOUNTER — Other Ambulatory Visit: Payer: Self-pay | Admitting: Urology

## 2023-03-30 ENCOUNTER — Encounter: Payer: Self-pay | Admitting: Family Medicine

## 2023-03-30 ENCOUNTER — Encounter: Payer: Self-pay | Admitting: Urology

## 2023-03-30 DIAGNOSIS — I639 Cerebral infarction, unspecified: Secondary | ICD-10-CM | POA: Diagnosis not present

## 2023-03-30 DIAGNOSIS — N390 Urinary tract infection, site not specified: Secondary | ICD-10-CM

## 2023-03-30 MED ORDER — NITROFURANTOIN MACROCRYSTAL 100 MG PO CAPS: 100.0000 mg | ORAL_CAPSULE | Freq: Every day | ORAL | 3 refills | Status: DC

## 2023-03-30 NOTE — Patient Instructions (Addendum)
 Alice [name]  03/30/2023     @PREFPERIOPPHARMACY @   Your procedure is scheduled on  04/05/2023.   Report to [facility] at  0710  A.M.    Call this number if you have problems the morning of surgery:  [phone]  If you experience any cold or flu symptoms such as cough, fever, chills, shortness of breath, etc. between now and your scheduled surgery, please notify us at the above number.   Remember:  Do not eat after midnight.   You may drink clear liquids until 0510 am on 04/05/2023.    Clear liquids allowed are:                    Water, Juice (No red color; non-citric and without pulp; diabetics please choose diet or no sugar options), Carbonated beverages (diabetics please choose diet or no sugar options), Clear Tea (No creamer, milk, or cream, including half & half and powdered creamer), Black Coffee Only (No creamer, milk or cream, including half & half and powdered creamer), Plain Jell-O Only (No red color; diabetics please choose no sugar options), Clear Sports drink (No red color; diabetics please choose diet or no sugar options), and Plain Popsicles Only (No red color; diabetics please choose no sugar options)    Take these medicines the morning of surgery with A SIP OF WATER                             baclofen, gabapentin, pantoprazole.    Do not wear jewelry, make-up or nail polish, including gel polish,  artificial nails, or any other type of covering on natural nails (fingers and  toes).  Do not wear lotions, powders, or perfumes, or deodorant.  Do not shave 48 hours prior to surgery.  Men may shave face and neck.  Do not bring valuables to the hospital.  [facility] is not responsible for any belongings or valuables.  Contacts, dentures or bridgework may not be worn into surgery.  Leave your suitcase in the car.  After surgery it may be brought to your room.  For patients admitted to the hospital, discharge time will be determined by your treatment  team.  Patients discharged the day of surgery will not be allowed to drive home and must have someone with them for 24 hours.    Special instructions:  DO NOT smoke tobacco or vape for 24 hours before your procedure.  Please read over the following fact sheets that you were given. Coughing and Deep Breathing, Surgical Site Infection Prevention, Anesthesia Post-op Instructions, and Care and Recovery After Surgery       Minimally Invasive Cholecystectomy, Care After The following information offers guidance on how to care for yourself after your procedure. Your health care provider may also give you more specific instructions. If you have problems or questions, contact your health care provider. What can I expect after the procedure? After the procedure, it is common to have: Pain at your incision sites. You will be given medicines to control this pain. Mild nausea or vomiting. Bloating and possible shoulder pain from the gas that was used during the procedure. Follow these instructions at home: Medicines Take over-the-counter and prescription medicines only as told by your health care provider. If you were prescribed an antibiotic medicine, take it as told by your health care provider. Do not stop using the antibiotic even if  you start to feel better. Ask your health care provider if the medicine prescribed to you: Requires you to avoid driving or using machinery. Can cause constipation. You may need to take these actions to prevent or treat constipation: Drink enough fluid to keep your urine pale yellow. Take over-the-counter or prescription medicines. Eat foods that are high in fiber, such as beans, whole grains, and fresh fruits and vegetables. Limit foods that are high in fat and processed sugars, such as fried or sweet foods. Incision care  Follow instructions from your health care provider about how to take care of your incisions. Make sure you: Wash your hands with soap and  water for at least 20 seconds before and after you change your bandage (dressing). If soap and water are not available, use hand sanitizer. Change your dressing as told by your health care provider. Leave stitches (sutures), skin glue, or adhesive strips in place. These skin closures may need to be in place for 2 weeks or longer. If adhesive strip edges start to loosen and curl up, you may trim the loose edges. Do not remove adhesive strips completely unless your health care provider tells you to do that. Do not take baths, swim, or use a hot tub until your health care provider approves. Ask your health care provider if you may take showers. You may only be allowed to take sponge baths. Check your incision area every day for signs of infection. Check for: More redness, swelling, or pain. Fluid or blood. Warmth. Pus or a bad smell. Activity Rest as told by your health care provider. Do not do activities that require a lot of effort. Avoid sitting for a long time without moving. Get up to take short walks every 1-2 hours. This is important to improve blood flow and breathing. Ask for help if you feel weak or unsteady. Do not lift anything that is heavier than 10 lb (4.5 kg), or the limit that you are told, until your health care provider says that it is safe. Do not play contact sports until your health care provider approves. Do not return to work or school until your health care provider approves. Return to your normal activities as told by your health care provider. Ask your health care provider what activities are safe for you. General instructions If you were given a sedative during the procedure, it can affect you for several hours. Do not drive or operate machinery until your health care provider says that it is safe. Keep all follow-up visits. This is important. Contact a health care provider if: You develop a rash. You have more redness, swelling, or pain around your incisions. You have  fluid or blood coming from your incisions. Your incisions feel warm to the touch. You have pus or a bad smell coming from your incisions. You have a fever. One or more of your incisions breaks open. Get help right away if: You have trouble breathing. You have chest pain. You have more pain in your shoulders. You faint or feel dizzy when you stand. You have severe pain in your abdomen. You have nausea or vomiting that lasts for more than one day. You have leg pain that is new or unusual, or if it is localized to one specific spot. These symptoms may represent a serious problem that is an emergency. Do not wait to see if the symptoms will go away. Get medical help right away. Call your local emergency services (911 in the [place] Do not drive  yourself to the hospital. Summary After your procedure, it is common to have pain at the incision sites. You may also have nausea or bloating. Follow your health care provider's instructions about medicine, activity restrictions, and caring for your incision areas. Do not do activities that require a lot of effort. Contact a health care provider if you have a fever or other signs of infection, such as more redness, swelling, or pain around the incisions. Get help right away if you have chest pain, increasing pain in the shoulders, or trouble breathing. This information is not intended to replace advice given to you by your health care provider. Make sure you discuss any questions you have with your health care provider. Document Revised: 06/30/2020 Document Reviewed: 07/01/2020 Elsevier Patient Education  2024 [employer]General Anesthesia, Adult, Care After The following information offers guidance on how to care for yourself after your procedure. Your health care provider may also give you more specific instructions. If you have problems or questions, contact your health care provider. What can I expect after the procedure? After the procedure, it is  common for people to: Have pain or discomfort at the IV site. Have nausea or vomiting. Have a sore throat or hoarseness. Have trouble concentrating. Feel cold or chills. Feel weak, sleepy, or tired (fatigue). Have soreness and body aches. These can affect parts of the body that were not involved in surgery. Follow these instructions at home: For the time period you were told by your health care provider:  Rest. Do not participate in activities where you could fall or become injured. Do not drive or use machinery. Do not drink alcohol. Do not take sleeping pills or medicines that cause drowsiness. Do not make important decisions or sign legal documents. Do not take care of children on your own. General instructions Drink enough fluid to keep your urine pale yellow. If you have sleep apnea, surgery and certain medicines can increase your risk for breathing problems. Follow instructions from your health care provider about wearing your sleep device: Anytime you are sleeping, including during daytime naps. While taking prescription pain medicines, sleeping medicines, or medicines that make you drowsy. Return to your normal activities as told by your health care provider. Ask your health care provider what activities are safe for you. Take over-the-counter and prescription medicines only as told by your health care provider. Do not use any products that contain nicotine or tobacco. These products include cigarettes, chewing tobacco, and vaping devices, such as e-cigarettes. These can delay incision healing after surgery. If you need help quitting, ask your health care provider. Contact a health care provider if: You have nausea or vomiting that does not get better with medicine. You vomit every time you eat or drink. You have pain that does not get better with medicine. You cannot urinate or have bloody urine. You develop a skin rash. You have a fever. Get help right away if: You have  trouble breathing. You have chest pain. You vomit blood. These symptoms may be an emergency. Get help right away. Call 911. Do not wait to see if the symptoms will go away. Do not drive yourself to the hospital. Summary After the procedure, it is common to have a sore throat, hoarseness, nausea, vomiting, or to feel weak, sleepy, or fatigue. For the time period you were told by your health care provider, do not drive or use machinery. Get help right away if you have difficulty breathing, have chest pain, or vomit blood. These  symptoms may be an emergency. This information is not intended to replace advice given to you by your health care provider. Make sure you discuss any questions you have with your health care provider. Document Revised: 03/27/2021 Document Reviewed: 03/27/2021 Elsevier Patient Education  2024 [employer]How to Use Chlorhexidine at Home in the Shower Chlorhexidine gluconate (CHG) is a germ-killing (antiseptic) wash that's used to clean the skin. It can get rid of the germs that normally live on the skin and can keep them away for about 24 hours. If you're having surgery, you may be told to shower with CHG at home the night before surgery. This can help lower your risk for infection. To use CHG wash in the shower, follow the steps below. Supplies needed: CHG body wash. Clean washcloth. Clean towel. How to use CHG in the shower Follow these steps unless you're told to use CHG in a different way: Start the shower. Use your normal soap and shampoo to wash your face and hair. Turn off the shower or move out of the shower stream. Pour CHG onto a clean washcloth. Do not use any type of brush or rough sponge. Start at your neck, washing your body down to your toes. Make sure you: Wash the part of your body where the surgery will be done for at least 1 minute. Do not scrub. Do not use CHG on your head or face unless your health care provider tells you to. If it gets into  your ears or eyes, rinse them well with water. Do not wash your genitals with CHG. Wash your back and under your arms. Make sure to wash skin folds. Let the CHG sit on your skin for 1-2 minutes or as long as told. Rinse your entire body in the shower, including all body creases and folds. Turn off the shower. Dry off with a clean towel. Do not put anything on your skin afterward, such as powder, lotion, or perfume. Put on clean clothes or pajamas. If it's the night before surgery, sleep in clean sheets. General tips Use CHG only as told, and follow the instructions on the label. Use the full amount of CHG as told. This is often one bottle. Do not smoke and stay away from flames after using CHG. Your skin may feel sticky after using CHG. This is normal. The sticky feeling will go away as the CHG dries. Do not use CHG: If you have a chlorhexidine allergy or have reacted to chlorhexidine in the past. On open wounds or areas of skin that have broken skin, cuts, or scrapes. On babies younger than 38 months of age. Contact a health care provider if: You have questions about using CHG. Your skin gets irritated or itchy. You have a rash after using CHG. You swallow any CHG. Call your local poison control center [phone] in the [place] Your eyes itch badly, or they become very red or swollen. Your hearing changes. You have trouble seeing. If you can't reach your provider, go to an urgent care or emergency room. Do not drive yourself. Get help right away if: You have swelling or tingling in your mouth or throat. You make high-pitched whistling sounds when you breathe, most often when you breathe out (wheeze). You have trouble breathing. These symptoms may be an emergency. Call 911 right away. Do not wait to see if the symptoms will go away. Do not drive yourself to the hospital. This information is not intended to replace advice given to you by  your health care provider. Make sure you  discuss any questions you have with your health care provider. Document Revised: 07/13/2022 Document Reviewed: 07/09/2021 Elsevier Patient Education  2024 [employer].

## 2023-03-31 ENCOUNTER — Encounter: Payer: Self-pay | Admitting: General Surgery

## 2023-03-31 ENCOUNTER — Encounter: Payer: Self-pay | Admitting: Family Medicine

## 2023-03-31 ENCOUNTER — Ambulatory Visit (INDEPENDENT_AMBULATORY_CARE_PROVIDER_SITE_OTHER): Admitting: Family Medicine

## 2023-03-31 VITALS — BP 134/88 | HR 72 | Temp 96.9°F | Ht 73.0 in | Wt 198.4 lb

## 2023-03-31 DIAGNOSIS — E559 Vitamin D deficiency, unspecified: Secondary | ICD-10-CM | POA: Diagnosis not present

## 2023-03-31 DIAGNOSIS — I639 Cerebral infarction, unspecified: Secondary | ICD-10-CM | POA: Diagnosis not present

## 2023-03-31 DIAGNOSIS — Z01818 Encounter for other preprocedural examination: Secondary | ICD-10-CM | POA: Diagnosis not present

## 2023-03-31 DIAGNOSIS — R7303 Prediabetes: Secondary | ICD-10-CM
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
## 2023-04-01 ENCOUNTER — Encounter: Payer: Self-pay | Admitting: Family Medicine

## 2023-04-01 ENCOUNTER — Encounter (HOSPITAL_COMMUNITY)
Admission: RE | Admit: 2023-04-01 | Discharge: 2023-04-01 | Disposition: A | Discharge status: Home / Self Care | Payer: Medicaid Other | Source: Ambulatory Visit | Attending: General Surgery | Admitting: General Surgery

## 2023-04-01 VITALS — BP 134/88 | HR 72 | Temp 96.9°F | Resp 18 | Ht 73.0 in | Wt 198.4 lb

## 2023-04-01 DIAGNOSIS — Z01818 Encounter for other preprocedural examination: Secondary | ICD-10-CM | POA: Insufficient documentation

## 2023-04-01 DIAGNOSIS — I639 Cerebral infarction, unspecified: Secondary | ICD-10-CM | POA: Diagnosis not present

## 2023-04-01 DIAGNOSIS — I10 Essential (primary) hypertension: Secondary | ICD-10-CM | POA: Insufficient documentation

## 2023-04-01 DIAGNOSIS — R7303 Prediabetes: Secondary | ICD-10-CM | POA: Diagnosis not present

## 2023-04-01 DIAGNOSIS — F1911 Other psychoactive substance abuse, in remission: Secondary | ICD-10-CM | POA: Diagnosis not present

## 2023-04-01 DIAGNOSIS — F17210 Nicotine dependence, cigarettes, uncomplicated: Secondary | ICD-10-CM | POA: Insufficient documentation

## 2023-04-01 DIAGNOSIS — Z8673 Personal history of transient ischemic attack (TIA), and cerebral infarction without residual deficits: Secondary | ICD-10-CM | POA: Insufficient documentation

## 2023-04-01 DIAGNOSIS — R011 Cardiac murmur, unspecified: Secondary | ICD-10-CM | POA: Insufficient documentation

## 2023-04-01 LAB — CBC WITH DIFFERENTIAL/PLATELET
Basophils Absolute: 0 10*3/uL (ref 0.0–0.2)
Basos: 1 %
EOS (ABSOLUTE): 0.2 10*3/uL (ref 0.0–0.4)
Eos: 4 %
Hematocrit: 37.9 % (ref 34.0–46.6)
Hemoglobin: 12.4 g/dL (ref 11.1–15.9)
Immature Grans (Abs): 0 10*3/uL (ref 0.0–0.1)
Immature Granulocytes: 0 %
Lymphocytes Absolute: 1.5 10*3/uL (ref 0.7–3.1)
Lymphs: 26 %
MCH: 29.7 pg (ref 26.6–33.0)
MCHC: 32.7 g/dL (ref 31.5–35.7)
MCV: 91 fL (ref 79–97)
Monocytes Absolute: 0.5 10*3/uL (ref 0.1–0.9)
Monocytes: 8 %
Neutrophils Absolute: 3.4 10*3/uL (ref 1.4–7.0)
Neutrophils: 61 %
Platelets: 209 10*3/uL (ref 150–450)
RBC: 4.17 x10E6/uL (ref 3.77–5.28)
RDW: 12.6 % (ref 11.7–15.4)
WBC: 5.6 10*3/uL (ref 3.4–10.8)

## 2023-04-01 LAB — CMP14+EGFR
ALT: 9 IU/L (ref 0–32)
AST: 17 IU/L (ref 0–40)
Albumin: 4.4 g/dL (ref 3.8–4.9)
Alkaline Phosphatase: 125 IU/L — ABNORMAL HIGH (ref 44–121)
BUN/Creatinine Ratio: 11 (ref 9–23)
BUN: 9 mg/dL (ref 6–24)
Bilirubin Total: 0.4 mg/dL (ref 0.0–1.2)
CO2: 24 mmol/L (ref 20–29)
Calcium: 9.7 mg/dL (ref 8.7–10.2)
Chloride: 103 mmol/L (ref 96–106)
Creatinine, Ser: 0.82 mg/dL (ref 0.57–1.00)
Globulin, Total: 2.7 g/dL (ref 1.5–4.5)
Glucose: 99 mg/dL (ref 70–99)
Potassium: 5.1 mmol/L (ref 3.5–5.2)
Sodium: 143 mmol/L (ref 134–144)
Total Protein: 7.1 g/dL (ref 6.0–8.5)
eGFR: 83 mL/min/{1.73_m2} (ref >59)

## 2023-04-01 LAB — RAPID URINE DRUG SCREEN, HOSP PERFORMED
Amphetamines: NOT DETECTED
Barbiturates: NOT DETECTED
Benzodiazepines: NOT DETECTED
Cocaine: NOT DETECTED
Opiates: NOT DETECTED
Tetrahydrocannabinol: NOT DETECTED

## 2023-04-01 LAB — VITAMIN D 25 HYDROXY (VIT D DEFICIENCY, FRACTURES): Vit D, 25-Hydroxy: 25.2 ng/mL — ABNORMAL LOW (ref 30.0–100.0)

## 2023-04-02 DIAGNOSIS — I639 Cerebral infarction, unspecified: Secondary | ICD-10-CM | POA: Diagnosis not present

## 2023-04-04 DIAGNOSIS — I639 Cerebral infarction, unspecified: Secondary | ICD-10-CM | POA: Diagnosis not present

## 2023-04-05 ENCOUNTER — Encounter (HOSPITAL_COMMUNITY): Payer: Self-pay | Admitting: General Surgery

## 2023-04-05 ENCOUNTER — Encounter: Payer: Self-pay | Admitting: General Surgery

## 2023-04-05 ENCOUNTER — Encounter (HOSPITAL_COMMUNITY)
Admission: RE | Disposition: A | Discharge status: Home / Self Care | Payer: Self-pay | Source: Home / Self Care | Attending: General Surgery

## 2023-04-05 ENCOUNTER — Ambulatory Visit (HOSPITAL_COMMUNITY): Admitting: Certified Registered"

## 2023-04-05 ENCOUNTER — Ambulatory Visit (HOSPITAL_BASED_OUTPATIENT_CLINIC_OR_DEPARTMENT_OTHER): Admitting: Certified Registered"

## 2023-04-05 ENCOUNTER — Ambulatory Visit (HOSPITAL_COMMUNITY)
Admission: RE | Admit: 2023-04-05 | Discharge: 2023-04-05 | Disposition: A | Discharge status: Home / Self Care | Payer: Medicaid Other | Attending: General Surgery | Admitting: General Surgery

## 2023-04-05 DIAGNOSIS — I10 Essential (primary) hypertension: Secondary | ICD-10-CM | POA: Insufficient documentation

## 2023-04-05 DIAGNOSIS — J449 Chronic obstructive pulmonary disease, unspecified: Secondary | ICD-10-CM | POA: Insufficient documentation

## 2023-04-05 DIAGNOSIS — I639 Cerebral infarction, unspecified: Secondary | ICD-10-CM | POA: Diagnosis not present

## 2023-04-05 DIAGNOSIS — Z8673 Personal history of transient ischemic attack (TIA), and cerebral infarction without residual deficits: Secondary | ICD-10-CM | POA: Diagnosis not present

## 2023-04-05 DIAGNOSIS — Z79891 Long term (current) use of opiate analgesic: Secondary | ICD-10-CM | POA: Diagnosis not present

## 2023-04-05 DIAGNOSIS — F17210 Nicotine dependence, cigarettes, uncomplicated: Secondary | ICD-10-CM | POA: Diagnosis not present

## 2023-04-05 DIAGNOSIS — K219 Gastro-esophageal reflux disease without esophagitis: Secondary | ICD-10-CM | POA: Insufficient documentation

## 2023-04-05 DIAGNOSIS — K8020 Calculus of gallbladder without cholecystitis without obstruction: Secondary | ICD-10-CM

## 2023-04-05 DIAGNOSIS — K8010 Calculus of gallbladder with chronic cholecystitis without obstruction: Secondary | ICD-10-CM | POA: Insufficient documentation

## 2023-04-05 DIAGNOSIS — Z01818 Encounter for other preprocedural examination: Secondary | ICD-10-CM

## 2023-04-05 SURGERY — CHOLECYSTECTOMY, ROBOT-ASSISTED, LAPAROSCOPIC
Anesthesia: General | Site: Abdomen

## 2023-04-05 MED ORDER — OXYCODONE HCL 5 MG/5ML PO SOLN: 5.0000 mg | Freq: Once | ORAL | Status: AC | PRN

## 2023-04-05 MED ORDER — STERILE WATER FOR IRRIGATION IR SOLN
Status: DC | PRN
  Administered 2023-04-05: 500 mL

## 2023-04-05 MED ORDER — FENTANYL CITRATE (PF) 100 MCG/2ML IJ SOLN
INTRAMUSCULAR | Status: AC
  Filled 2023-04-05: qty 2

## 2023-04-05 MED ORDER — CEFAZOLIN SODIUM-DEXTROSE 2-4 GM/100ML-% IV SOLN
INTRAVENOUS | Status: AC
  Filled 2023-04-05: qty 100

## 2023-04-05 MED ORDER — CHLORHEXIDINE GLUCONATE CLOTH 2 % EX PADS
6.0000 | MEDICATED_PAD | Freq: Once | CUTANEOUS | Status: AC
  Administered 2023-04-05: 6 via TOPICAL

## 2023-04-05 MED ORDER — ONDANSETRON HCL 4 MG/2ML IJ SOLN: 4.0000 mg | Freq: Once | INTRAMUSCULAR | Status: DC | PRN

## 2023-04-05 MED ORDER — FENTANYL CITRATE PF 50 MCG/ML IJ SOSY
25.0000 ug | PREFILLED_SYRINGE | INTRAMUSCULAR | Status: DC | PRN
  Administered 2023-04-05 (×2): 50 ug via INTRAVENOUS
  Filled 2023-04-05 (×2): qty 1

## 2023-04-05 MED ORDER — PHENYLEPHRINE 80 MCG/ML (10ML) SYRINGE FOR IV PUSH (FOR BLOOD PRESSURE SUPPORT)
PREFILLED_SYRINGE | INTRAVENOUS | Status: DC | PRN
  Administered 2023-04-05: 160 ug via INTRAVENOUS

## 2023-04-05 MED ORDER — LACTATED RINGERS IV SOLN: INTRAVENOUS | Status: DC

## 2023-04-05 MED ORDER — INDOCYANINE GREEN 25 MG IV SOLR
INTRAVENOUS | Status: AC
  Administered 2023-04-05: 2.5 mg via INTRAVENOUS
  Filled 2023-04-05: qty 10

## 2023-04-05 MED ORDER — KETOROLAC TROMETHAMINE 30 MG/ML IJ SOLN
INTRAMUSCULAR | Status: DC | PRN
Start: 2023-04-05 — End: 2023-04-05
  Administered 2023-04-05: 30 mg via INTRAVENOUS

## 2023-04-05 MED ORDER — FENTANYL CITRATE (PF) 100 MCG/2ML IJ SOLN
INTRAMUSCULAR | Status: DC | PRN
  Administered 2023-04-05 (×4): 50 ug via INTRAVENOUS

## 2023-04-05 MED ORDER — LIDOCAINE HCL (PF) 2 % IJ SOLN
INTRAMUSCULAR | Status: DC | PRN
Start: 2023-04-05 — End: 2023-04-05
  Administered 2023-04-05: 100 mg via INTRADERMAL

## 2023-04-05 MED ORDER — PROPOFOL 10 MG/ML IV BOLUS
INTRAVENOUS | Status: AC
  Filled 2023-04-05: qty 20

## 2023-04-05 MED ORDER — KETOROLAC TROMETHAMINE 30 MG/ML IJ SOLN
INTRAMUSCULAR | Status: AC
  Filled 2023-04-05: qty 1

## 2023-04-05 MED ORDER — SUGAMMADEX SODIUM 200 MG/2ML IV SOLN
INTRAVENOUS | Status: AC
  Filled 2023-04-05: qty 2

## 2023-04-05 MED ORDER — ORAL CARE MOUTH RINSE: 15.0000 mL | Freq: Once | OROMUCOSAL | Status: AC

## 2023-04-05 MED ORDER — INDOCYANINE GREEN 25 MG IV SOLR: 2.5000 mg | Freq: Once | INTRAVENOUS | Status: AC

## 2023-04-05 MED ORDER — ONDANSETRON HCL 4 MG/2ML IJ SOLN
INTRAMUSCULAR | Status: AC
  Filled 2023-04-05: qty 2

## 2023-04-05 MED ORDER — DEXAMETHASONE SODIUM PHOSPHATE 10 MG/ML IJ SOLN
INTRAMUSCULAR | Status: DC | PRN
  Administered 2023-04-05: 4 mg via INTRAVENOUS

## 2023-04-05 MED ORDER — ONDANSETRON HCL 4 MG/2ML IJ SOLN
INTRAMUSCULAR | Status: DC | PRN
  Administered 2023-04-05: 4 mg via INTRAVENOUS

## 2023-04-05 MED ORDER — BUPIVACAINE HCL (PF) 0.5 % IJ SOLN
INTRAMUSCULAR | Status: AC
  Filled 2023-04-05: qty 30

## 2023-04-05 MED ORDER — DEXMEDETOMIDINE HCL IN NACL 80 MCG/20ML IV SOLN
INTRAVENOUS | Status: DC | PRN
  Administered 2023-04-05 (×2): 8 ug via INTRAVENOUS

## 2023-04-05 MED ORDER — MIDAZOLAM HCL 2 MG/2ML IJ SOLN
INTRAMUSCULAR | Status: DC | PRN
  Administered 2023-04-05: 2 mg via INTRAVENOUS

## 2023-04-05 MED ORDER — MIDAZOLAM HCL 2 MG/2ML IJ SOLN
INTRAMUSCULAR | Status: AC
  Filled 2023-04-05: qty 2

## 2023-04-05 MED ORDER — HEMOSTATIC AGENTS (NO CHARGE) OPTIME
TOPICAL | Status: DC | PRN
Start: 2023-04-05 — End: 2023-04-05
  Administered 2023-04-05: 1 via TOPICAL

## 2023-04-05 MED ORDER — CHLORHEXIDINE GLUCONATE 0.12 % MT SOLN
15.0000 mL | Freq: Once | OROMUCOSAL | Status: AC
  Administered 2023-04-05: 15 mL via OROMUCOSAL

## 2023-04-05 MED ORDER — PHENYLEPHRINE 80 MCG/ML (10ML) SYRINGE FOR IV PUSH (FOR BLOOD PRESSURE SUPPORT)
PREFILLED_SYRINGE | INTRAVENOUS | Status: AC
  Filled 2023-04-05: qty 10

## 2023-04-05 MED ORDER — OXYCODONE HCL 5 MG PO TABS
5.0000 mg | ORAL_TABLET | Freq: Once | ORAL | Status: AC | PRN
  Administered 2023-04-05: 5 mg via ORAL
  Filled 2023-04-05: qty 1

## 2023-04-05 MED ORDER — SUGAMMADEX SODIUM 200 MG/2ML IV SOLN
INTRAVENOUS | Status: DC | PRN
Start: 2023-04-05 — End: 2023-04-05
  Administered 2023-04-05: 200 mg via INTRAVENOUS

## 2023-04-05 MED ORDER — CEFAZOLIN SODIUM-DEXTROSE 2-4 GM/100ML-% IV SOLN
2.0000 g | INTRAVENOUS | Status: AC
  Administered 2023-04-05: 2 g via INTRAVENOUS

## 2023-04-05 MED ORDER — SCOPOLAMINE 1 MG/3DAYS TD PT72
MEDICATED_PATCH | TRANSDERMAL | Status: AC
  Administered 2023-04-05: 1.5 mg via TRANSDERMAL
  Filled 2023-04-05: qty 1

## 2023-04-05 MED ORDER — ROCURONIUM BROMIDE 10 MG/ML (PF) SYRINGE
PREFILLED_SYRINGE | INTRAVENOUS | Status: DC | PRN
  Administered 2023-04-05: 50 mg via INTRAVENOUS

## 2023-04-05 MED ORDER — SCOPOLAMINE 1 MG/3DAYS TD PT72: 1.0000 | MEDICATED_PATCH | TRANSDERMAL | Status: DC

## 2023-04-05 MED ORDER — ONDANSETRON HCL 4 MG/2ML IJ SOLN: INTRAMUSCULAR | Status: DC | PRN

## 2023-04-05 MED ORDER — LIDOCAINE HCL (PF) 2 % IJ SOLN
INTRAMUSCULAR | Status: AC
  Filled 2023-04-05: qty 5

## 2023-04-05 MED ORDER — BUPIVACAINE HCL (PF) 0.5 % IJ SOLN
INTRAMUSCULAR | Status: DC | PRN
  Administered 2023-04-05: 30 mL

## 2023-04-05 MED ORDER — PROPOFOL 10 MG/ML IV BOLUS
INTRAVENOUS | Status: DC | PRN
  Administered 2023-04-05: 150 mg via INTRAVENOUS

## 2023-04-05 MED ORDER — ROCURONIUM BROMIDE 10 MG/ML (PF) SYRINGE
PREFILLED_SYRINGE | INTRAVENOUS | Status: AC
  Filled 2023-04-05: qty 10

## 2023-04-05 MED ORDER — OXYCODONE HCL 5 MG PO TABS: 5.0000 mg | ORAL_TABLET | ORAL | 0 refills | Status: DC | PRN

## 2023-04-05 SURGICAL SUPPLY — 35 items
CAUTERY HOOK MNPLR 1.6 DVNC XI (INSTRUMENTS) ×2 IMPLANT
CHLORAPREP W/TINT 26 (MISCELLANEOUS) ×2 IMPLANT
CLIP LIGATING HEM O LOK PURPLE (MISCELLANEOUS) ×2 IMPLANT
DERMABOND ADVANCED .7 DNX12 (GAUZE/BANDAGES/DRESSINGS) ×2 IMPLANT
DRAPE ARM DVNC X/XI (DISPOSABLE) ×8 IMPLANT
DRAPE COLUMN DVNC XI (DISPOSABLE) ×2 IMPLANT
ELECT REM PT RETURN 9FT ADLT (ELECTROSURGICAL) ×1 IMPLANT
ELECTRODE REM PT RTRN 9FT ADLT (ELECTROSURGICAL) ×2 IMPLANT
FORCEPS BPLR R/ABLATION 8 DVNC (INSTRUMENTS) ×2 IMPLANT
FORCEPS PROGRASP DVNC XI (FORCEP) ×2 IMPLANT
GLOVE BIOGEL PI IND STRL 7.0 (GLOVE) ×4 IMPLANT
GLOVE SURG SS PI 7.5 STRL IVOR (GLOVE) ×4 IMPLANT
GOWN STRL REUS W/TWL LRG LVL3 (GOWN DISPOSABLE) ×6 IMPLANT
HEMOSTAT SURGICEL 4X8 (HEMOSTASIS) IMPLANT
IRRIGATOR SUCT 8 DISP DVNC XI (IRRIGATION / IRRIGATOR) IMPLANT
KIT TURNOVER KIT A (KITS) ×2 IMPLANT
MANIFOLD NEPTUNE II (INSTRUMENTS) ×2 IMPLANT
NDL HYPO 21X1.5 SAFETY (NEEDLE) ×2 IMPLANT
NDL INSUFFLATION 14GA 120MM (NEEDLE) ×2 IMPLANT
NEEDLE HYPO 21X1.5 SAFETY (NEEDLE) ×1 IMPLANT
NEEDLE INSUFFLATION 14GA 120MM (NEEDLE) ×1 IMPLANT
OBTURATOR OPTICAL STND 8 DVNC (TROCAR) ×1 IMPLANT
OBTURATOR OPTICALSTD 8 DVNC (TROCAR) ×2 IMPLANT
PACK LAP CHOLE LZT030E (CUSTOM PROCEDURE TRAY) ×2 IMPLANT
PAD ARMBOARD POSITIONER FOAM (MISCELLANEOUS) ×2 IMPLANT
PENCIL HANDSWITCHING (ELECTRODE) ×2 IMPLANT
SEAL UNIV 5-12 XI (MISCELLANEOUS) ×8 IMPLANT
SET BASIN LINEN APH (SET/KITS/TRAYS/PACK) ×2 IMPLANT
SET TUBE SMOKE EVAC HIGH FLOW (TUBING) ×2 IMPLANT
SUT MNCRL AB 4-0 PS2 18 (SUTURE) ×4 IMPLANT
SUT VICRYL 0 UR6 27IN ABS (SUTURE) IMPLANT
SYR 30ML LL (SYRINGE) ×2 IMPLANT
SYS RETRIEVAL 5MM INZII UNIV (BASKET) ×1 IMPLANT
SYSTEM RETRIEVL 5MM INZII UNIV (BASKET) ×2 IMPLANT
WATER STERILE IRR 500ML POUR (IV SOLUTION) ×2 IMPLANT

## 2023-04-05 NOTE — Anesthesia Procedure Notes (Signed)
 Procedure Name: Intubation Date/Time: 04/05/2023 9:18 AM  Performed by: Julian Reil, CRNAPre-anesthesia Checklist: Patient identified, Emergency Drugs available, Suction available and Patient being monitored Patient Re-evaluated:Patient Re-evaluated prior to induction Oxygen Delivery Method: Circle system utilized Preoxygenation: Pre-oxygenation with 100% oxygen Induction Type: IV induction Ventilation: Mask ventilation without difficulty Laryngoscope Size: Glidescope and 3 Grade View: Grade I Tube type: Oral Tube size: 7.0 mm Number of attempts: 1 Airway Equipment and Method: Video-laryngoscopy and Rigid stylet Placement Confirmation: ETT inserted through vocal cords under direct vision, positive ETCO2 and breath sounds checked- equal and bilateral Secured at: 22 cm Tube secured with: Tape Dental Injury: Teeth and Oropharynx as per pre-operative assessment  Comments: Glidescope electively used for educational purpose, med student DL, intubation.

## 2023-04-05 NOTE — Anesthesia Preprocedure Evaluation (Signed)
 Anesthesia Evaluation  Patient identified by MRN, date of birth, ID band Patient awake    Reviewed: Allergy & Precautions, H&P , NPO status , Patient's Chart, lab work & pertinent test results, reviewed documented beta blocker date and time   Airway Mallampati: II  TM Distance: >3 FB Neck ROM: full    Dental no notable dental hx.    Pulmonary neg pulmonary ROS, COPD, Current Smoker and Patient abstained from smoking.   Pulmonary exam normal breath sounds clear to auscultation       Cardiovascular Exercise Tolerance: Good hypertension, negative cardio ROS + Valvular Problems/Murmurs  Rhythm:regular Rate:Normal     Neuro/Psych  PSYCHIATRIC DISORDERS Anxiety Depression    CVA negative neurological ROS  negative psych ROS   GI/Hepatic negative GI ROS, Neg liver ROS,GERD  ,,  Endo/Other  negative endocrine ROS    Renal/GU negative Renal ROS  negative genitourinary   Musculoskeletal   Abdominal   Peds  Hematology negative hematology ROS (+) Blood dyscrasia, anemia   Anesthesia Other Findings   Reproductive/Obstetrics negative OB ROS                             Anesthesia Physical Anesthesia Plan  ASA: 3  Anesthesia Plan: General and General ETT   Post-op Pain Management:    Induction:   PONV Risk Score and Plan: Scopolamine patch - Pre-op  Airway Management Planned:   Additional Equipment:   Intra-op Plan:   Post-operative Plan:   Informed Consent: I have reviewed the patients History and Physical, chart, labs and discussed the procedure including the risks, benefits and alternatives for the proposed anesthesia with the patient or authorized representative who has indicated his/her understanding and acceptance.     Dental Advisory Given  Plan Discussed with: CRNA  Anesthesia Plan Comments:        Anesthesia Quick Evaluation

## 2023-04-05 NOTE — Op Note (Signed)
 Patient:  Alice [name]  DOB:  20-Nov-1965  MRN:  [id number]   Preop Diagnosis: Biliary colic, cholelithiasis  Postop Diagnosis: Same  Procedure: Robotic assisted laparoscopic cholecystectomy  Surgeon: Franky Macho, MD  Anes: General Endotracheal  Indications: Patient is a 58 year old white female who presents with biliary colic secondary to cholelithiasis.  The risks and benefits of the procedure including bleeding, infection, hepatobiliary injury, and the possibility of an open procedure were fully explained to the patient, who gave informed consent.  Procedure note: The patient was placed in the supine position.  After induction of general endotracheal anesthesia, the abdomen was prepped and draped using usual sterile technique with ChloraPrep.  Surgical site confirmation was performed.  An infraumbilical incision was made down to the fascia.  A Veress needle was introduced into the abdominal cavity and confirmation of placement was done using the saline drop test.  The abdomen was then insufflated to 15 mmHg pressure.  An 8 mm trocar was introduced into the abdominal cavity under direct visualization without difficulty.  The patient was placed in reverse Trendelenburg position and additional 8 mm trocars were placed on the left upper quadrant, right lower quadrant, and right flank regions.  The robot was then docked and targeted.  The liver was within normal limits.  The gallbladder was retracted in a dynamic fashion in order to provide a critical view of the triangle of Calot.  The cystic artery was first identified.  It was divided using a Hem-o-lok clip.  In order to isolate the cystic duct, I did do a dome down approach given the anatomy.  I did identify the common bile duct.  The cystic duct was fully identified.  Firefly was used throughout the procedure.  Hem-o-lok clips were placed proximally and distally on the cystic duct and the cystic duct was divided.  No abnormal bleeding was  noted.  No bile leakage was noted.  Surgicel was placed in the gallbladder fossa.  The gallbladder was then removed using an Endo Catch bag.  All fluid and air were then evacuated from the abdominal cavity after undocking of the robot.  All trocars were removed without difficulty.  All wounds were irrigated with normal saline.  All wounds were injected with 0.5% Sensorcaine.  All incisions were closed using a 4-0 Monocryl subcuticular suture.  Dermabond was applied.  All tape and needle counts were correct at the end of the procedure.  The patient was extubated in the operating room and transferred to PACU in stable condition.  Complications: None  EBL: Minimal  Specimen: Gallbladder

## 2023-04-05 NOTE — Interval H&P Note (Signed)
 History and Physical Interval Note:  04/05/2023 8:43 AM  Alice [name]  has presented today for surgery, with the diagnosis of CHOLELITHIASIS.  The various methods of treatment have been discussed with the patient and family. After consideration of risks, benefits and other options for treatment, the patient has consented to  Procedure(s): CHOLECYSTECTOMY, ROBOT-ASSISTED, LAPAROSCOPIC (N/A) as a surgical intervention.  The patient's history has been reviewed, patient examined, no change in status, stable for surgery.  I have reviewed the patient's chart and labs.  Questions were answered to the patient's satisfaction.     Franky Macho

## 2023-04-05 NOTE — Transfer of Care (Signed)
 Immediate Anesthesia Transfer of Care Note  Patient: Alice [name]  Procedure(s) Performed: CHOLECYSTECTOMY, ROBOT-ASSISTED, LAPAROSCOPIC (Abdomen)  Patient Location: PACU  Anesthesia Type:General  Level of Consciousness: awake, alert , and oriented  Airway & Oxygen Therapy: Patient Spontanous Breathing and Patient connected to face mask oxygen  Post-op Assessment: Report given to RN and Post -op Vital signs reviewed and stable  Post vital signs: Reviewed and stable  Last Vitals:  Vitals Value Taken Time  BP 153/89   Temp 97.4   Pulse 75 04/05/23 1025  Resp 20   SpO2 100 % 04/05/23 1025  Vitals shown include unfiled device data.  Last Pain:  Vitals:   04/05/23 0730  PainSc: 0-No pain         Complications: No notable events documented.

## 2023-04-05 NOTE — Interval H&P Note (Signed)
 History and Physical Interval Note:  04/05/2023 8:40 AM  Alice [name]  has presented today for surgery, with the diagnosis of CHOLELITHIASIS.  The various methods of treatment have been discussed with the patient and family. After consideration of risks, benefits and other options for treatment, the patient has consented to  Procedure(s): CHOLECYSTECTOMY, ROBOT-ASSISTED, LAPAROSCOPIC (N/A) as a surgical intervention.  The patient's history has been reviewed, patient examined, no change in status, stable for surgery.  I have reviewed the patient's chart and labs.  Questions were answered to the patient's satisfaction.     Franky Macho

## 2023-04-06 ENCOUNTER — Encounter: Payer: Self-pay | Admitting: General Surgery

## 2023-04-06 ENCOUNTER — Ambulatory Visit (INDEPENDENT_AMBULATORY_CARE_PROVIDER_SITE_OTHER): Payer: Medicaid Other | Admitting: Gastroenterology

## 2023-04-06 DIAGNOSIS — I639 Cerebral infarction, unspecified: Secondary | ICD-10-CM | POA: Diagnosis not present

## 2023-04-06 LAB — SURGICAL PATHOLOGY

## 2023-04-07 DIAGNOSIS — I639 Cerebral infarction, unspecified: Secondary | ICD-10-CM | POA: Diagnosis not present

## 2023-04-08 DIAGNOSIS — I639 Cerebral infarction, unspecified: Secondary | ICD-10-CM | POA: Diagnosis not present

## 2023-04-08 NOTE — Anesthesia Postprocedure Evaluation (Signed)
 Anesthesia Post Note  Patient: Alice [name]  Procedure(s) Performed: CHOLECYSTECTOMY, ROBOT-ASSISTED, LAPAROSCOPIC (Abdomen)  Patient location during evaluation: Phase II Anesthesia Type: General Level of consciousness: awake Pain management: pain level controlled Vital Signs Assessment: post-procedure vital signs reviewed and stable Respiratory status: spontaneous breathing and respiratory function stable Cardiovascular status: blood pressure returned to baseline and stable Postop Assessment: no headache and no apparent nausea or vomiting Anesthetic complications: no Comments: Late entry   No notable events documented.   Last Vitals:  Vitals:   04/05/23 1049 04/05/23 1119  BP:  (!) 158/97  Pulse: 63   Resp: 15   Temp:  (!) 36.4 C  SpO2: 96% 98%    Last Pain:  Vitals:   04/06/23 1444  TempSrc:   PainSc: 5                  Windell Norfolk

## 2023-04-09 DIAGNOSIS — I639 Cerebral infarction, unspecified: Secondary | ICD-10-CM | POA: Diagnosis not present

## 2023-04-11 DIAGNOSIS — I639 Cerebral infarction, unspecified: Secondary | ICD-10-CM | POA: Diagnosis not present

## 2023-04-12 ENCOUNTER — Ambulatory Visit: Payer: Medicaid Other | Admitting: Internal Medicine

## 2023-04-12 DIAGNOSIS — I639 Cerebral infarction, unspecified: Secondary | ICD-10-CM | POA: Diagnosis not present

## 2023-04-13 DIAGNOSIS — I639 Cerebral infarction, unspecified: Secondary | ICD-10-CM | POA: Diagnosis not present

## 2023-04-14 ENCOUNTER — Ambulatory Visit (INDEPENDENT_AMBULATORY_CARE_PROVIDER_SITE_OTHER): Admitting: General Surgery

## 2023-04-14 ENCOUNTER — Encounter: Payer: Self-pay | Admitting: General Surgery

## 2023-04-14 DIAGNOSIS — K8020 Calculus of gallbladder without cholecystitis without obstruction: Secondary | ICD-10-CM

## 2023-04-14 DIAGNOSIS — Z09 Encounter for follow-up examination after completed treatment for conditions other than malignant neoplasm: Secondary | ICD-10-CM

## 2023-04-14 DIAGNOSIS — I639 Cerebral infarction, unspecified: Secondary | ICD-10-CM | POA: Diagnosis not present

## 2023-04-14 NOTE — Progress Notes (Signed)
 Subjective:     Alice [name]  Virtual telephone postoperative visit performed with patient.  She is status post a robotic assisted laparoscopic cholecystectomy.  She was at home and I was in the office.  She states her preoperative symptoms have resolved.  Her incisional pain has resolved.  She is pleased with the results.  She denies any fever or chills. Objective:    LMP 07/30/2011   General:  alert and cooperative  Final pathology consistent with diagnosis.     Assessment:    Doing well postoperatively.    Plan:   Patient may resume normal activity.  She was instructed to call me should any problems arise.  Follow-up here as needed.  As this was a part of the total global surgical fee, this was not a billable visit.  Total telephone time was 1 minute.

## 2023-04-15 DIAGNOSIS — I639 Cerebral infarction, unspecified: Secondary | ICD-10-CM | POA: Diagnosis not present

## 2023-04-16 DIAGNOSIS — I639 Cerebral infarction, unspecified: Secondary | ICD-10-CM | POA: Diagnosis not present

## 2023-04-17 DIAGNOSIS — I639 Cerebral infarction, unspecified: Secondary | ICD-10-CM | POA: Diagnosis not present

## 2023-04-18 ENCOUNTER — Other Ambulatory Visit: Payer: Self-pay | Admitting: Internal Medicine

## 2023-04-18 DIAGNOSIS — J449 Chronic obstructive pulmonary disease, unspecified: Secondary | ICD-10-CM | POA: Diagnosis not present

## 2023-04-18 DIAGNOSIS — K5900 Constipation, unspecified: Secondary | ICD-10-CM | POA: Diagnosis not present

## 2023-04-18 DIAGNOSIS — F319 Bipolar disorder, unspecified: Secondary | ICD-10-CM | POA: Diagnosis not present

## 2023-04-18 DIAGNOSIS — E785 Hyperlipidemia, unspecified: Secondary | ICD-10-CM | POA: Diagnosis not present

## 2023-04-18 DIAGNOSIS — I639 Cerebral infarction, unspecified: Secondary | ICD-10-CM | POA: Diagnosis not present

## 2023-04-19 DIAGNOSIS — I639 Cerebral infarction, unspecified: Secondary | ICD-10-CM | POA: Diagnosis not present

## 2023-04-20 DIAGNOSIS — I639 Cerebral infarction, unspecified: Secondary | ICD-10-CM | POA: Diagnosis not present

## 2023-04-21 ENCOUNTER — Encounter: Payer: Self-pay | Admitting: Internal Medicine

## 2023-04-21 DIAGNOSIS — I639 Cerebral infarction, unspecified: Secondary | ICD-10-CM | POA: Diagnosis not present

## 2023-04-22 DIAGNOSIS — I639 Cerebral infarction, unspecified: Secondary | ICD-10-CM | POA: Diagnosis not present

## 2023-04-23 DIAGNOSIS — I639 Cerebral infarction, unspecified: Secondary | ICD-10-CM | POA: Diagnosis not present

## 2023-04-24 DIAGNOSIS — I639 Cerebral infarction, unspecified: Secondary | ICD-10-CM | POA: Diagnosis not present

## 2023-04-25 ENCOUNTER — Encounter: Payer: Self-pay | Admitting: Family Medicine

## 2023-04-25 DIAGNOSIS — I639 Cerebral infarction, unspecified: Secondary | ICD-10-CM | POA: Diagnosis not present

## 2023-04-25 NOTE — Telephone Encounter (Signed)
 She is supposed to be on Pantoprazole (protonix) 40 mg   Take  30-60 min before first meal of the day and Pepcid (famotidine)  20 mg after supper until return to office    She may be getting confused because otc zantac is actually famotidine and supposed to be on 20 mg but can take extra prn  Would also  try gavsicon 1-2tbsp prn as well but really if not able to swallow needs to be seen by PCP or UC asap or to ER if not able to be seen today

## 2023-04-26 DIAGNOSIS — I639 Cerebral infarction, unspecified: Secondary | ICD-10-CM | POA: Diagnosis not present

## 2023-04-27 ENCOUNTER — Other Ambulatory Visit: Payer: Self-pay

## 2023-04-27 ENCOUNTER — Emergency Department (HOSPITAL_COMMUNITY)
Admission: EM | Admit: 2023-04-27 | Discharge: 2023-04-27 | Disposition: A | Discharge status: Home / Self Care | Attending: Emergency Medicine | Admitting: Emergency Medicine

## 2023-04-27 ENCOUNTER — Encounter (HOSPITAL_COMMUNITY): Payer: Self-pay | Admitting: Emergency Medicine

## 2023-04-27 DIAGNOSIS — I10 Essential (primary) hypertension: Secondary | ICD-10-CM | POA: Diagnosis not present

## 2023-04-27 DIAGNOSIS — T426X1A Poisoning by other antiepileptic and sedative-hypnotic drugs, accidental (unintentional), initial encounter: Secondary | ICD-10-CM | POA: Diagnosis present

## 2023-04-27 DIAGNOSIS — Z7982 Long term (current) use of aspirin: Secondary | ICD-10-CM | POA: Insufficient documentation

## 2023-04-27 DIAGNOSIS — T50901A Poisoning by unspecified drugs, medicaments and biological substances, accidental (unintentional), initial encounter: Secondary | ICD-10-CM | POA: Diagnosis not present

## 2023-04-27 DIAGNOSIS — I639 Cerebral infarction, unspecified: Secondary | ICD-10-CM | POA: Diagnosis not present

## 2023-04-27 LAB — CBC WITH DIFFERENTIAL/PLATELET
Abs Immature Granulocytes: 0.01 10*3/uL (ref 0.00–0.07)
Basophils Absolute: 0 10*3/uL (ref 0.0–0.1)
Basophils Relative: 1 %
Eosinophils Absolute: 0.2 10*3/uL (ref 0.0–0.5)
Eosinophils Relative: 4 %
HCT: 35.6 % — ABNORMAL LOW (ref 36.0–46.0)
Hemoglobin: 11.4 g/dL — ABNORMAL LOW (ref 12.0–15.0)
Immature Granulocytes: 0 %
Lymphocytes Relative: 26 %
Lymphs Abs: 1.4 10*3/uL (ref 0.7–4.0)
MCH: 29.5 pg (ref 26.0–34.0)
MCHC: 32 g/dL (ref 30.0–36.0)
MCV: 92 fL (ref 80.0–100.0)
Monocytes Absolute: 0.5 10*3/uL (ref 0.1–1.0)
Monocytes Relative: 9 %
Neutro Abs: 3.2 10*3/uL (ref 1.7–7.7)
Neutrophils Relative %: 60 %
Platelets: 269 10*3/uL (ref 150–400)
RBC: 3.87 MIL/uL (ref 3.87–5.11)
RDW: 12.9 % (ref 11.5–15.5)
WBC: 5.3 10*3/uL (ref 4.0–10.5)
nRBC: 0 % (ref 0.0–0.2)

## 2023-04-27 LAB — COMPREHENSIVE METABOLIC PANEL WITH GFR
ALT: 12 U/L (ref 0–44)
AST: 17 U/L (ref 15–41)
Albumin: 3.8 g/dL (ref 3.5–5.0)
Alkaline Phosphatase: 107 U/L (ref 38–126)
Anion gap: 12 (ref 5–15)
BUN: 9 mg/dL (ref 6–20)
CO2: 27 mmol/L (ref 22–32)
Calcium: 9.3 mg/dL (ref 8.9–10.3)
Chloride: 102 mmol/L (ref 98–111)
Creatinine, Ser: 0.69 mg/dL (ref 0.44–1.00)
GFR, Estimated: >60 mL/min (ref >60)
Glucose, Bld: 109 mg/dL — ABNORMAL HIGH (ref 70–99)
Potassium: 3.9 mmol/L (ref 3.5–5.1)
Sodium: 141 mmol/L (ref 135–145)
Total Bilirubin: 0.6 mg/dL (ref 0.0–1.2)
Total Protein: 7.6 g/dL (ref 6.5–8.1)

## 2023-04-27 LAB — RAPID URINE DRUG SCREEN, HOSP PERFORMED
Amphetamines: NOT DETECTED
Barbiturates: NOT DETECTED
Benzodiazepines: NOT DETECTED
Cocaine: NOT DETECTED
Opiates: NOT DETECTED
Tetrahydrocannabinol: NOT DETECTED

## 2023-04-27 LAB — ACETAMINOPHEN LEVEL: Acetaminophen (Tylenol), Serum: <10 ug/mL — ABNORMAL LOW (ref 10–30)

## 2023-04-27 LAB — ETHANOL: Alcohol, Ethyl (B): <10 mg/dL (ref <10)

## 2023-04-27 LAB — SALICYLATE LEVEL: Salicylate Lvl: <7 mg/dL — ABNORMAL LOW (ref 7.0–30.0)

## 2023-04-27 NOTE — ED Provider Notes (Signed)
 [place] EMERGENCY DEPARTMENT AT [facility] Provider Note   CSN: [id number] Arrival date & time: 04/27/23  4098     History  Chief Complaint  Patient presents with   Drug Overdose    Alice [name] is a 58 y.o. female.  58 year old female with a history of polysubstance abuse now on Subutex and recent cholecystectomy presents to the emergency department after accidental overdose on gabapentin.  Patient reports that she is managing another family members medication which includes gabapentin.  Says that that individual takes 600 mg tablets.  The patient says that she is supposed to be on 100 mg twice daily.  Tells me that she finally got her 100 mg tablets and put them in a bottle for her to take this morning but accidentally took the other family member's 600 mg tablets at 4:30 AM.  She took 5 tablets.  Says it was not intentional.  No SI.  No other drug use or ingestions.  Was prescribed oxycodone for her cholecystectomy but reports that she only took 5 tabs.  Says she has been clean for the past 3 years.       Home Medications Prior to Admission medications   Medication Sig Start Date End Date Taking? Authorizing Provider  aspirin EC 81 MG tablet Take 1 tablet (81 mg total) by mouth daily with breakfast. For Stroke Prevention 07/25/21   Shon Hale, MD  atorvastatin (LIPITOR) 40 MG tablet TAKE 1 TABLET BY MOUTH EVERY DAY 01/13/23   Rakes, Doralee Albino, FNP  baclofen (LIORESAL) 10 MG tablet Take 1 tablet (10 mg total) by mouth 2 (two) times daily. 07/25/21   Shon Hale, MD  buprenorphine (SUBUTEX) 8 MG SUBL SL tablet Place 4 mg under the tongue in the morning, at noon, in the evening, and at bedtime. 03/21/23   [provider]  clotrimazole (LOTRIMIN) 1 % cream Apply to affected area 2 times daily 11/11/22   Arabella Merles, PA-C  famotidine (PEPCID) 20 MG tablet TAKE 1 TABLET BY MOUTH EVERY DAY AFTER SUPPER 04/18/23   Nyoka Cowden, MD  gabapentin (NEURONTIN)  100 MG capsule Take 1 capsule (100 mg total) by mouth 4 (four) times daily. Patient taking differently: Take 100 mg by mouth 2 (two) times daily. 01/14/22   Nyoka Cowden, MD  ibuprofen (ADVIL) 200 MG tablet Take 200 mg by mouth every 6 (six) hours as needed for moderate pain (pain score 4-6) or headache.    [provider]  ketoconazole (NIZORAL) 2 % cream Apply 1 Application topically daily. 03/07/23   Sonny Masters, FNP  losartan (COZAAR) 25 MG tablet Take 1 tablet (25 mg total) by mouth daily. 12/21/22   Sonny Masters, FNP  nitrofurantoin (MACRODANTIN) 100 MG capsule Take 1 capsule (100 mg total) by mouth daily. 03/30/23   Stoneking, Danford Bad., MD  oxyCODONE (ROXICODONE) 5 MG immediate release tablet Take 1 tablet (5 mg total) by mouth every 4 (four) hours as needed. 04/05/23 04/04/24  Franky Macho, MD  pantoprazole (PROTONIX) 40 MG tablet TAKE 1 TABLET BY MOUTH EVERY DAY 01/11/23   Rakes, Doralee Albino, FNP  polyethylene glycol powder (GLYCOLAX/MIRALAX) 17 GM/SCOOP powder Take 17 g by mouth daily. Patient taking differently: Take 17 g by mouth daily as needed for moderate constipation. 03/12/22   Sonny Masters, FNP  SUBOXONE 8-2 MG FILM Place 0.5 Film under the tongue every 6 (six) hours. 10/15/20   [provider]      Allergies  Benadryl [diphenhydramine hcl], Codeine, and Tetracycline    Review of Systems   Review of Systems  Physical Exam Updated Vital Signs BP (!) 133/95   Pulse 69   Temp 97.7 F (36.5 C) (Oral)   Resp 20   Ht 6' (1.829 m)   Wt 89.9 kg   LMP 07/30/2011   SpO2 97%   BMI 26.88 kg/m  Physical Exam Vitals and nursing note reviewed.  Constitutional:      General: She is not in acute distress.    Appearance: She is well-developed.     Comments: Nausea but easily arousable.  Alert and oriented x 3.  HENT:     Head: Normocephalic and atraumatic.     Right Ear: External ear normal.     Left Ear: External ear normal.     Nose: Nose normal.   Eyes:     Extraocular Movements: Extraocular movements intact.     Conjunctiva/sclera: Conjunctivae normal.     Pupils: Pupils are equal, round, and reactive to light.  Cardiovascular:     Rate and Rhythm: Normal rate and regular rhythm.     Heart sounds: No murmur heard. Pulmonary:     Effort: Pulmonary effort is normal. No respiratory distress.     Breath sounds: Normal breath sounds.  Musculoskeletal:     Cervical back: Normal range of motion and neck supple.     Right lower leg: No edema.     Left lower leg: No edema.  Skin:    General: Skin is warm and dry.  Neurological:     Mental Status: She is oriented to person, place, and time. Mental status is at baseline.  Psychiatric:        Mood and Affect: Mood normal.     ED Results / Procedures / Treatments   Labs (all labs ordered are listed, but only abnormal results are displayed) Labs Reviewed  CBC WITH DIFFERENTIAL/PLATELET - Abnormal; Notable for the following components:      Result Value   Hemoglobin 11.4 (*)    HCT 35.6 (*)    All other components within normal limits  COMPREHENSIVE METABOLIC PANEL WITH GFR - Abnormal; Notable for the following components:   Glucose, Bld 109 (*)    All other components within normal limits  ACETAMINOPHEN LEVEL - Abnormal; Notable for the following components:   Acetaminophen (Tylenol), Serum <10 (*)    All other components within normal limits  SALICYLATE LEVEL - Abnormal; Notable for the following components:   Salicylate Lvl <7.0 (*)    All other components within normal limits  ETHANOL  RAPID URINE DRUG SCREEN, HOSP PERFORMED    EKG EKG Interpretation Date/Time:  Wednesday April 27 2023 09:06:15 EDT Ventricular Rate:  86 PR Interval:  153 QRS Duration:  100 QT Interval:  368 QTC Calculation: 438 R Axis:   48  Text Interpretation: Sinus rhythm Abnormal R-wave progression, early transition Confirmed by Vonita Moss [phone]) on 04/27/2023 9:25:53  AM  Radiology No results found.  Procedures Procedures    Medications Ordered in ED Medications - No data to display  ED Course/ Medical Decision Making/ A&P                                  Medical Decision Making Amount and/or Complexity of Data Reviewed Labs: ordered.   Alice [name] is a 58 y.o. female with comorbidities that complicate the patient evaluation  including polysubstance abuse now on Subutex and recent cholecystectomy presents to the emergency department after accidental overdose on gabapentin.    Initial Ddx:  Overdose, respiratory depression, hypoxia, polysubstance abuse, suicide attempt  MDM/Course:  Patient presents emergency department with accidental ingestion of 3 g of gabapentin at 4:30 AM.  On arrival she is drowsy but conversant.  No episodes of respiratory depression or hypoxia.  Hemodynamically stable otherwise.  Says that this is not an attempt at self-harm.  No other ingestions.  Discussed with poison control who feels that based on the time of the ingestion the peak effect of the medication has already had effect.  Unless her renal function is normal she should be cleared from a toxicology standpoint.  She had lab work which included a urine drug screen, Tylenol, and salicylate levels which were normal.  Renal function was normal as well.  Will have the patient follow-up with her primary doctor regarding her pain medication.  Counseled not to take any more gabapentin until this evening.  This patient presents to the ED for concern of complaints listed in HPI, this involves an extensive number of treatment options, and is a complaint that carries with it a high risk of complications and morbidity. Disposition including potential need for admission considered.   Dispo: DC Home. Return precautions discussed including, but not limited to, those listed in the AVS. Allowed pt time to ask questions which were answered fully prior to dc.  Records reviewed  Outpatient Clinic Notes The following labs were independently interpreted: Chemistry and show no acute abnormality I personally reviewed and interpreted cardiac monitoring: normal sinus rhythm  I personally reviewed and interpreted the pt's EKG: see above for interpretation  I have reviewed the patients home medications and made adjustments as needed  Portions of this note were generated with Dragon dictation software. Dictation errors may occur despite best attempts at proofreading.     Final Clinical Impression(s) / ED Diagnoses Final diagnoses:  Accidental overdose, initial encounter    Rx / DC Orders ED Discharge Orders     None         Ninetta Basket, MD 04/27/23 1108

## 2023-04-27 NOTE — Discharge Instructions (Signed)
 You were seen for your accidental overdose on gabapentin in the emergency department.   At home, please don't take gabapentin until tonight.    Check your MyChart online for the results of any tests that had not resulted by the time you left the emergency department.   Follow-up with your primary doctor in 2-3 days regarding your visit.    Return immediately to the emergency department if you experience any of the following: thoughts of self harm, difficulty breathing, or any other concerning symptoms.    Thank you for visiting our Emergency Department. It was a pleasure taking care of you today.

## 2023-04-27 NOTE — ED Notes (Signed)
 EDP at bedside

## 2023-04-27 NOTE — ED Triage Notes (Signed)
 Pt bib rcems with a report of an accidental overdose on gabapentin. Pt stated she ran out of her 100mg  tab prescription. She had an old prescription of 600 mg tablets. Pt stated she had broken the 600mg  tablets into 6 pieces to make them 100mg  each. She reports some of the tablets had not be split into the 6 pieces and she accidentally took 5 whole 600mg  gabapentin tablets as well as her SL subutex. PT is very tearful and worried that this looks intentional. Pt reports a 40+ year drug use, but has been clean for 3 years.

## 2023-04-28 DIAGNOSIS — I639 Cerebral infarction, unspecified: Secondary | ICD-10-CM | POA: Diagnosis not present

## 2023-04-29 DIAGNOSIS — I639 Cerebral infarction, unspecified: Secondary | ICD-10-CM | POA: Diagnosis not present

## 2023-04-30 DIAGNOSIS — I639 Cerebral infarction, unspecified: Secondary | ICD-10-CM | POA: Diagnosis not present

## 2023-05-02 ENCOUNTER — Other Ambulatory Visit: Payer: Self-pay | Admitting: Family Medicine

## 2023-05-02 DIAGNOSIS — I639 Cerebral infarction, unspecified: Secondary | ICD-10-CM | POA: Diagnosis not present

## 2023-05-02 DIAGNOSIS — Z1231 Encounter for screening mammogram for malignant neoplasm of breast: Secondary | ICD-10-CM

## 2023-05-03 DIAGNOSIS — I639 Cerebral infarction, unspecified: Secondary | ICD-10-CM | POA: Diagnosis not present

## 2023-05-04 DIAGNOSIS — I639 Cerebral infarction, unspecified: Secondary | ICD-10-CM | POA: Diagnosis not present

## 2023-05-05 DIAGNOSIS — I639 Cerebral infarction, unspecified: Secondary | ICD-10-CM | POA: Diagnosis not present

## 2023-05-06 DIAGNOSIS — I639 Cerebral infarction, unspecified: Secondary | ICD-10-CM | POA: Diagnosis not present

## 2023-05-07 DIAGNOSIS — I639 Cerebral infarction, unspecified: Secondary | ICD-10-CM | POA: Diagnosis not present

## 2023-05-08 ENCOUNTER — Telehealth: Admitting: Physician Assistant

## 2023-05-08 DIAGNOSIS — J028 Acute pharyngitis due to other specified organisms: Secondary | ICD-10-CM

## 2023-05-08 DIAGNOSIS — B9689 Other specified bacterial agents as the cause of diseases classified elsewhere: Secondary | ICD-10-CM

## 2023-05-08 DIAGNOSIS — I639 Cerebral infarction, unspecified: Secondary | ICD-10-CM | POA: Diagnosis not present

## 2023-05-08 MED ORDER — AMOXICILLIN 500 MG PO CAPS: 500.0000 mg | ORAL_CAPSULE | Freq: Two times a day (BID) | ORAL | 0 refills | Status: AC

## 2023-05-08 NOTE — Progress Notes (Signed)
 Virtual Visit Consent   Alice [name], you are scheduled for a virtual visit with a [facility] provider today. Just as with appointments in the office, your consent must be obtained to participate. Your consent will be active for this visit and any virtual visit you may have with one of our providers in the next 365 days. If you have a MyChart account, a copy of this consent can be sent to you electronically.  As this is a virtual visit, video technology does not allow for your provider to perform a traditional examination. This may limit your provider's ability to fully assess your condition. If your provider identifies any concerns that need to be evaluated in person or the need to arrange testing (such as labs, EKG, etc.), we will make arrangements to do so. Although advances in technology are sophisticated, we cannot ensure that it will always work on either your end or our end. If the connection with a video visit is poor, the visit may have to be switched to a telephone visit. With either a video or telephone visit, we are not always able to ensure that we have a secure connection.  By engaging in this virtual visit, you consent to the provision of healthcare and authorize for your insurance to be billed (if applicable) for the services provided during this visit. Depending on your insurance coverage, you may receive a charge related to this service.  I need to obtain your verbal consent now. Are you willing to proceed with your visit today? Alice [name] has provided verbal consent on 05/08/2023 for a virtual visit (video or telephone). Alice Scriver, PA-C  Date: 05/08/2023 6:47 PM   Virtual Visit via Video Note   I, Alice [name], connected with  Alice [name]  ([id number], January 22, 1965) on 05/08/23 at  6:15 PM EDT by a video-enabled telemedicine application and verified that I am speaking with the correct person using two identifiers.  Location: Patient: Virtual Visit Location Patient:  Home Provider: Virtual Visit Location Provider: Home Office   I discussed the limitations of evaluation and management by telemedicine and the availability of in person appointments. The patient expressed understanding and agreed to proceed.    History of Present Illness: Alice [name] is a 58 y.o. who identifies as a female who was assigned female at birth, with a history of multiple strokes and recurrent urinary tract infections, presents with symptoms suggestive of tonsillitis. She reports feeling 'yucky' for the past three days, with a sensation of having a fever without an actual elevated temperature. Over the past few days, her temperature has ranged from 99.4 to 100.5 degrees Fahrenheit. Today she noticed pain while swallowing after eating, and upon self-examination, she observed inflammation and small pus pockets on the left side of her throat. She has a history of recurrent tonsillitis, which has been previously treated with amoxicillin . She also reports being on Macrobid  for recurrent urinary tract infections and has recently started taking probiotics.   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 01/15/2022   GERD (gastroesophageal reflux  disease) 07/24/2021   Depression, major, single episode, moderate (HCC) 10/22/2020   GAD (generalized anxiety disorder) 11/01/2018   Vitamin D  deficiency 11/01/2018   H/o Prior ischemic stroke (HCC) 09/03/2016   History of stroke 05/09/2016   History of substance abuse (HCC) 05/09/2016   Heart murmur  01/08/2016   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   Dyslipidemia 10/04/2015   HTN (hypertension) 10/04/2015   Discitis 10/04/2015   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/08/2015   Postlaminectomy syndrome, lumbar region 12/04/2012   Anemia 06/17/2012   Chronic low back pain 06/17/2012   DDD (degenerative disc disease), lumbosacral 06/17/2012   Sacroiliitis (HCC) 06/17/2012   Precordial pain 02/18/2010   Cigarette smoker 12/05/2009   Anxiety state 12/05/2009   Palpitations 12/05/2009    Allergies:  Allergies  Allergen Reactions   Benadryl [Diphenhydramine Hcl] Hives   Codeine Rash   Tetracycline Itching and Rash   Medications:  Current Outpatient Medications:    amoxicillin  (AMOXIL ) 500 MG capsule, Take 1 capsule (500 mg total) by mouth 2 (two) times daily for 10 days., Disp: 20 capsule, Rfl: 0   aspirin  EC 81 MG tablet, Take 1 tablet (81 mg total) by mouth daily with breakfast. For Stroke Prevention, Disp: 30 tablet, Rfl: 11   atorvastatin  (LIPITOR) 40 MG tablet, TAKE 1 TABLET BY MOUTH EVERY DAY, Disp: 90 tablet, Rfl: 1   baclofen  (LIORESAL ) 10 MG tablet, Take 1 tablet (10 mg total) by mouth 2 (two) times daily., Disp: 60 tablet, Rfl: 3   buprenorphine  (SUBUTEX ) 8 MG SUBL SL tablet, Place 4 mg under the tongue in the morning, at noon, in the evening, and at bedtime., Disp: , Rfl:    clotrimazole  (LOTRIMIN ) 1 % cream, Apply to affected area 2 times daily, Disp: 15 g, Rfl: 0   famotidine  (PEPCID ) 20 MG tablet, TAKE 1 TABLET BY MOUTH EVERY DAY AFTER SUPPER, Disp: 30 tablet, Rfl: 11   gabapentin  (NEURONTIN ) 100 MG capsule, Take 1 capsule (100 mg total) by mouth 4 (four) times daily. (Patient taking differently: Take 100 mg by mouth 2 (two) times daily.), Disp: 120 capsule, Rfl: 2   ibuprofen  (ADVIL ) 200 MG tablet, Take 200 mg by mouth every 6 (six) hours as needed for moderate pain (pain score 4-6) or headache., Disp: , Rfl:    ketoconazole   (NIZORAL ) 2 % cream, Apply 1 Application topically daily., Disp: 30 g, Rfl: 0   losartan  (COZAAR ) 25 MG tablet, Take 1 tablet (25 mg total) by mouth daily., Disp: 90 tablet, Rfl: 1   nitrofurantoin  (MACRODANTIN ) 100 MG capsule, Take 1 capsule (100 mg total) by mouth daily., Disp: 30 capsule, Rfl: 3   oxyCODONE  (ROXICODONE ) 5 MG immediate release tablet, Take 1 tablet (5 mg total) by mouth every 4 (four) hours as needed., Disp: 15 tablet, Rfl: 0   pantoprazole  (PROTONIX ) 40 MG tablet, TAKE 1 TABLET BY MOUTH EVERY DAY, Disp: 90 tablet, Rfl: 1   polyethylene glycol powder (GLYCOLAX /MIRALAX ) 17 GM/SCOOP powder, Take 17 g by mouth daily. (Patient taking differently: Take 17 g by mouth daily as needed for moderate constipation.), Disp: 3350 g, Rfl: 1   SUBOXONE  8-2 MG FILM, Place 0.5 Film under the tongue every 6 (six) hours., Disp: , Rfl:   Observations/Objective: Patient is well-developed, well-nourished in no acute distress.  Resting comfortably  at home.  Head is normocephalic, atraumatic.  No labored breathing.  Speech is clear and coherent with logical content.  Patient is alert and  oriented at baseline.    Assessment and Plan: 1. Acute bacterial pharyngitis (Primary) - amoxicillin  (AMOXIL ) 500 MG capsule; Take 1 capsule (500 mg total) by mouth 2 (two) times daily for 10 days.  Dispense: 20 capsule; Refill: 0   Acute tonsillitis with pus on left tonsil. Recurrent history,  Amoxicillin  chosen for efficacy. - Advise yogurt or probiotics to prevent GI upset. - Recommend adequate hydration. - Suggest acetaminophen  or ibuprofen  for pain.   Follow Up Instructions: I discussed the assessment and treatment plan with the patient. The patient was provided an opportunity to ask questions and all were answered. The patient agreed with the plan and demonstrated an understanding of the instructions.  A copy of instructions were sent to the patient via MyChart unless otherwise noted below.     The  patient was advised to call back or seek an in-person evaluation if the symptoms worsen or if the condition fails to improve as anticipated.    Etter Hermann Mayers, PA-C

## 2023-05-08 NOTE — Patient Instructions (Signed)
 Alice [name], thank you for joining Malcom Scriver, PA-C for today's virtual visit.  While this provider is not your primary care provider (PCP), if your PCP is located in our provider database this encounter information will be shared with them immediately following your visit.   A [facility] MyChart account gives you access to today's visit and all your visits, tests, and labs performed at [facility] " click here if you don't have a [facility] MyChart account or go to [email or web address]  Consent: (Patient) Alice [name] provided verbal consent for this virtual visit at the beginning of the encounter.  Current Medications:  Current Outpatient Medications:    amoxicillin  (AMOXIL ) 500 MG capsule, Take 1 capsule (500 mg total) by mouth 2 (two) times daily for 10 days., Disp: 20 capsule, Rfl: 0   aspirin  EC 81 MG tablet, Take 1 tablet (81 mg total) by mouth daily with breakfast. For Stroke Prevention, Disp: 30 tablet, Rfl: 11   atorvastatin  (LIPITOR) 40 MG tablet, TAKE 1 TABLET BY MOUTH EVERY DAY, Disp: 90 tablet, Rfl: 1   baclofen  (LIORESAL ) 10 MG tablet, Take 1 tablet (10 mg total) by mouth 2 (two) times daily., Disp: 60 tablet, Rfl: 3   buprenorphine  (SUBUTEX ) 8 MG SUBL SL tablet, Place 4 mg under the tongue in the morning, at noon, in the evening, and at bedtime., Disp: , Rfl:    clotrimazole  (LOTRIMIN ) 1 % cream, Apply to affected area 2 times daily, Disp: 15 g, Rfl: 0   famotidine  (PEPCID ) 20 MG tablet, TAKE 1 TABLET BY MOUTH EVERY DAY AFTER SUPPER, Disp: 30 tablet, Rfl: 11   gabapentin  (NEURONTIN ) 100 MG capsule, Take 1 capsule (100 mg total) by mouth 4 (four) times daily. (Patient taking differently: Take 100 mg by mouth 2 (two) times daily.), Disp: 120 capsule, Rfl: 2   ibuprofen  (ADVIL ) 200 MG tablet, Take 200 mg by mouth every 6 (six) hours as needed for moderate pain (pain score 4-6) or headache., Disp: , Rfl:    ketoconazole  (NIZORAL ) 2 % cream, Apply 1  Application topically daily., Disp: 30 g, Rfl: 0   losartan  (COZAAR ) 25 MG tablet, Take 1 tablet (25 mg total) by mouth daily., Disp: 90 tablet, Rfl: 1   nitrofurantoin  (MACRODANTIN ) 100 MG capsule, Take 1 capsule (100 mg total) by mouth daily., Disp: 30 capsule, Rfl: 3   oxyCODONE  (ROXICODONE ) 5 MG immediate release tablet, Take 1 tablet (5 mg total) by mouth every 4 (four) hours as needed., Disp: 15 tablet, Rfl: 0   pantoprazole  (PROTONIX ) 40 MG tablet, TAKE 1 TABLET BY MOUTH EVERY DAY, Disp: 90 tablet, Rfl: 1   polyethylene glycol powder (GLYCOLAX /MIRALAX ) 17 GM/SCOOP powder, Take 17 g by mouth daily. (Patient taking differently: Take 17 g by mouth daily as needed for moderate constipation.), Disp: 3350 g, Rfl: 1   SUBOXONE  8-2 MG FILM, Place 0.5 Film under the tongue every 6 (six) hours., Disp: , Rfl:    Medications ordered in this encounter:  Meds ordered this encounter  Medications   amoxicillin  (AMOXIL ) 500 MG capsule    Sig: Take 1 capsule (500 mg total) by mouth 2 (two) times daily for 10 days.    Dispense:  20 capsule    Refill:  0    Supervising Provider:   Corine Dice [[id number]]     *If you need refills on other medications prior to your next appointment, please contact your pharmacy*  Follow-Up: Call back or seek an in-person evaluation  if the symptoms worsen or if the condition fails to improve as anticipated.  [facility] [phone]  Other Instructions Pharyngitis  Pharyngitis is inflammation of the throat (pharynx). It is a very common cause of sore throat. Pharyngitis can be caused by a bacteria, but it is usually caused by a virus. Most cases of pharyngitis get better on their own without treatment. What are the causes? This condition may be caused by: Infection by viruses (viral). Viral pharyngitis spreads easily from person to person (is contagious) through coughing, sneezing, and sharing of personal items or utensils such as cups, forks,  spoons, and toothbrushes. Infection by bacteria (bacterial). Bacterial pharyngitis may be spread by touching the nose or face after coming in contact with the bacteria, or through close contact, such as kissing. Allergies. Allergies can cause buildup of mucus in the throat (post-nasal drip), leading to inflammation and irritation. Allergies can also cause blocked nasal passages, forcing breathing through the mouth, which dries and irritates the throat. What increases the risk? You are more likely to develop this condition if: You are 75-70 years old. You are exposed to crowded environments such as daycare, school, or dormitory living. You live in a cold climate. You have a weakened disease-fighting (immune) system. What are the signs or symptoms? Symptoms of this condition vary by the cause. Common symptoms of this condition include: Sore throat. Fatigue. Low-grade fever. Stuffy nose (nasal congestion) and cough. Headache. Other symptoms may include: Glands in the neck (lymph nodes) that are swollen. Skin rashes. Plaque-like film on the throat or tonsils. This is often a symptom of bacterial pharyngitis. Vomiting. Red, itchy eyes (conjunctivitis). Loss of appetite. Joint pain and muscle aches. Enlarged tonsils. How is this diagnosed? This condition may be diagnosed based on your medical history and a physical exam. Your health care provider will ask you questions about your illness and your symptoms. A swab of your throat may be done to check for bacteria (rapid strep test). Other lab tests may also be done, depending on the suspected cause, but these are rare. How is this treated? Many times, treatment is not needed for this condition. Pharyngitis usually gets better in 3-4 days without treatment. Bacterial pharyngitis may be treated with antibiotic medicines. Follow these instructions at home: Medicines Take over-the-counter and prescription medicines only as told by your health  care provider. If you were prescribed an antibiotic medicine, take it as told by your health care provider. Do not stop taking the antibiotic even if you start to feel better. Use throat sprays to soothe your throat as told by your health care provider. Children can get pharyngitis. Do not give your child aspirin  because of the association with Reye's syndrome. Managing pain To help with pain, try: Sipping warm liquids, such as broth, herbal tea, or warm water . Eating or drinking cold or frozen liquids, such as frozen ice pops. Gargling with a mixture of salt and water  3-4 times a day or as needed. To make salt water , completely dissolve -1 tsp (3-6 g) of salt in 1 cup (237 mL) of warm water . Sucking on hard candy or throat lozenges. Putting a cool-mist humidifier in your bedroom at night to moisten the air. Sitting in the bathroom with the door closed for 5-10 minutes while you run hot water  in the shower.  General instructions  Do not use any products that contain nicotine  or tobacco. These products include cigarettes, chewing tobacco, and vaping devices, such as e-cigarettes. If you need  help quitting, ask your health care provider. Rest as told by your health care provider. Drink enough fluid to keep your urine pale yellow. How is this prevented? To help prevent becoming infected or spreading infection: Wash your hands often with soap and water  for at least 20 seconds. If soap and water  are not available, use hand sanitizer. Do not touch your eyes, nose, or mouth with unwashed hands, and wash hands after touching these areas. Do not share cups or eating utensils. Avoid close contact with people who are sick. Contact a health care provider if: You have large, tender lumps in your neck. You have a rash. You cough up green, yellow-brown, or bloody mucus. Get help right away if: Your neck becomes stiff. You drool or are unable to swallow liquids. You cannot drink or take medicines  without vomiting. You have severe pain that does not go away, even after you take medicine. You have trouble breathing, and it is not caused by a stuffy nose. You have new pain and swelling in your joints such as the knees, ankles, wrists, or elbows. These symptoms may represent a serious problem that is an emergency. Do not wait to see if the symptoms will go away. Get medical help right away. Call your local emergency services (911 in the [place] Do not drive yourself to the hospital. Summary Pharyngitis is redness, pain, and swelling (inflammation) of the throat (pharynx). While pharyngitis can be caused by a bacteria, the most common causes are viral. Most cases of pharyngitis get better on their own without treatment. Bacterial pharyngitis is treated with antibiotic medicines. This information is not intended to replace advice given to you by your health care provider. Make sure you discuss any questions you have with your health care provider. Document Revised: 03/26/2020 Document Reviewed: 03/26/2020 Elsevier Patient Education  2024 Elsevier Inc.   If you have been instructed to have an in-person evaluation today at a local Urgent Care facility, please use the link below. It will take you to a list of all of our available [facility], including address, phone number and hours of operation. Please do not delay care.  [facility]  If you or a family member do not have a primary care provider, use the link below to schedule a visit and establish care. When you choose a [facility] primary care physician or advanced practice provider, you gain a long-term partner in health. Find a Primary Care Provider  Learn more about [facility]'s in-office and virtual care options:  - Get Care Now

## 2023-05-09 DIAGNOSIS — I639 Cerebral infarction, unspecified: Secondary | ICD-10-CM | POA: Diagnosis not present

## 2023-05-11 NOTE — Progress Notes (Deleted)
 Alice [name], female    DOB: [birth year]    MRN: [id number]   Brief patient profile:  52 yowf  active smoker  referred to pulmonary clinic in [place]  01/14/2022 by Evalyn Hillier  for copd eval     History of Present Illness  01/14/2022  Pulmonary/ 1st office eval/ Alice [name] / Alice [name] Office maint on Anoro /ACEi / low dose ppi  Chief Complaint  Patient presents with   Consult    SOB/ COPD   Dyspnea:  baseline (oct 2023) still needing neb 3 x daily  Still house work  / doe across the house front door to kitchen ride scooter to shop / walks bent over walking   Cough: nonp productive day > noct (as long as sleeps at 45 degrees) even when better still coughing but much worse since Nov 2023 with bilateral sym lat cp just with coughing fits   Sleep: much worse when try to sleep flat so using recliner x 45 degrees  SABA use: neb 3 x daily  02: none  Vax never vax/ once infected   Rec Pantoprazole  (protonix ) 40 mg   Take  30-60 min before first meal of the day and add  Pepcid  (famotidine )  20 mg after supper until return to office - this is the best way to tell whether stomach acid is contributing to your problem.   Stop all smoking Stop lisinopil and anoro  Just use your nebulizer up every 4 hours as needed for cough or breathing problems  Diovan  160 (valsartan ) one daily  -  ok to break in half if too strong Gabapentin  100 mg increased to 4 x daily  For cough > mucinex  dm 1200 mg every 12 hours and avoid mint menthol chocolate  Return with all  inhalers/ solutions in hand       07/09/2022  f/u ov/Alice [name] office/Alice [name] re: ? Acei case maint on did not  bring meds  Chief Complaint  Patient presents with   Follow-up  Dyspnea:  rollator  limited by back x 7 min  Cough: non productive, has not tried mucinex  dm yet Sleeping: in bed 45 degrees - no longer in recliner  SABA use: already used 4 h prior to ov 02: none   Rec My office will be contacting you by phone for referral for lung cancer  screening  > did not do as of 10/08/2022  Take two of gabapentin  in evening should help with cough and can adjust upward with your pain doctor 07/12/22 if needed For cough/ congestion >  mucinex  dm  up to maximum of  1200 mg every 12 hours as needed    The key is to stop smoking completely before smoking completely stops you!  Please schedule a follow up visit in 3 months but call sooner if needed with pfts on return   10/08/2022  f/u ov/Alice [name] office/Alice [name] re: GOLD ? / still smoking  maint on prn neb   Chief Complaint  Patient presents with   Upper airway cough syndrome   Dyspnea: using  rollator due to back pain  / mailbox and return  ok  50 ft flat  Cough: none  Sleeping: 45 degrees s  resp cc  SABA use: neb every few weeks  02: none  Rec The key is to stop smoking completely before smoking completely stops you!  - LDSCT  12/01/22 mild centrilobular and paraseptal emphysema  -  PFT's  12/30/22  FEV1 2.62 (75 % ) ratio 0.77  p 0 % improvement from saba p 0 prior to study with DLCO  18.11 (69%)   and FV curve nl  and ERV 51 at wt 194       05/12/2023  f/u ov/ office/Alice [name] re: *** maint on ***  No chief complaint on file.   Dyspnea:  *** Cough: *** Sleeping: ***   resp cc  SABA use: *** 02: ***  Lung cancer screening: ***   No obvious day to day or daytime variability or assoc excess/ purulent sputum or mucus plugs or hemoptysis or cp or chest tightness, subjective wheeze or overt sinus or hb symptoms.    Also denies any obvious fluctuation of symptoms with weather or environmental changes or other aggravating or alleviating factors except as outlined above   No unusual exposure hx or h/o childhood pna/ asthma or knowledge of premature birth.  Current Allergies, Complete Past Medical History, Past Surgical History, Family History, and Social History were reviewed in [facility] record.  ROS  The following are not active complaints unless  bolded Hoarseness, sore throat, dysphagia, dental problems, itching, sneezing,  nasal congestion or discharge of excess mucus or purulent secretions, ear ache,   fever, chills, sweats, unintended wt loss or wt gain, classically pleuritic or exertional cp,  orthopnea pnd or arm/hand swelling  or leg swelling, presyncope, palpitations, abdominal pain, anorexia, nausea, vomiting, diarrhea  or change in bowel habits or change in bladder habits, change in stools or change in urine, dysuria, hematuria,  rash, arthralgias, visual complaints, headache, numbness, weakness or ataxia or problems with walking or coordination,  change in mood or  memory.        No outpatient medications have been marked as taking for the 05/12/23 encounter (Appointment) with Diamond Formica, MD.          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   Qualifier: Diagnosis of  By: Lyndel Sanes     Chronic low back pain    Cocaine abuse (HCC)    COPD (chronic obstructive pulmonary disease) (HCC)    Heart murmur    HLD (hyperlipidemia)    Hypertension    Stroke (HCC)    x4     Objective:    Wts  05/12/2023          ***  10/08/2022       195  07/09/22 189 lb 3.2 oz (85.8 kg)  06/23/22 197 lb (89.4 kg)  06/15/22 196 lb (88.9 kg)     Vital signs reviewed  05/12/2023  - Note at rest 02 sats  ***% on ***   General appearance:    ***    Min barr  2/6 sem  ***                  I personally reviewed images and agree with radiology impression as follows:   Chest CTa    07/18/21  1. No evidence of pulmonary embolus. 2. Small area of atelectasis versus airspace consolidation in the lateral aspect of the lingula. 3. Possible mild interstitial pulmonary edema. 4. Moderate in size hiatal hernia with diffuse dilation of the esophagus. 5. Chronic thoracic spine fractures.     Assessment

## 2023-05-12 ENCOUNTER — Ambulatory Visit: Admitting: Internal Medicine

## 2023-05-12 DIAGNOSIS — I639 Cerebral infarction, unspecified: Secondary | ICD-10-CM | POA: Diagnosis not present

## 2023-05-13 DIAGNOSIS — I639 Cerebral infarction, unspecified: Secondary | ICD-10-CM | POA: Diagnosis not present

## 2023-05-14 DIAGNOSIS — I639 Cerebral infarction, unspecified: Secondary | ICD-10-CM | POA: Diagnosis not present

## 2023-05-16 DIAGNOSIS — I639 Cerebral infarction, unspecified: Secondary | ICD-10-CM | POA: Diagnosis not present

## 2023-05-17 DIAGNOSIS — I639 Cerebral infarction, unspecified: Secondary | ICD-10-CM | POA: Diagnosis not present

## 2023-05-18 DIAGNOSIS — I639 Cerebral infarction, unspecified: Secondary | ICD-10-CM | POA: Diagnosis not present

## 2023-05-19 DIAGNOSIS — I639 Cerebral infarction, unspecified: Secondary | ICD-10-CM | POA: Diagnosis not present

## 2023-05-20 DIAGNOSIS — I639 Cerebral infarction, unspecified: Secondary | ICD-10-CM | POA: Diagnosis not present

## 2023-05-21 DIAGNOSIS — I639 Cerebral infarction, unspecified: Secondary | ICD-10-CM | POA: Diagnosis not present

## 2023-05-23 ENCOUNTER — Ambulatory Visit

## 2023-05-23 DIAGNOSIS — I639 Cerebral infarction, unspecified: Secondary | ICD-10-CM | POA: Diagnosis not present

## 2023-06-06 ENCOUNTER — Other Ambulatory Visit: Payer: Self-pay | Admitting: Family Medicine

## 2023-06-06 DIAGNOSIS — L282 Other prurigo: Secondary | ICD-10-CM

## 2023-06-12 NOTE — Progress Notes (Unsigned)
 Alice [name], female    DOB: 1965/01/15    MRN: [id number]   Brief patient profile:  64 yowf  active smoker  referred to pulmonary clinic in [place]  01/14/2022 by Evalyn Hillier  for copd eval     History of Present Illness  01/14/2022  Pulmonary/ 1st office eval/ [facility] / [facility] Office maint on Anoro /ACEi / low dose ppi  Chief Complaint  Patient presents with   Consult    SOB/ COPD   Dyspnea:  baseline (oct 2023) still needing neb 3 x daily  Still house work  / doe across the house front door to kitchen ride scooter to shop / walks bent over walking   Cough: nonp productive day > noct (as long as sleeps at 45 degrees) even when better still coughing but much worse since Nov 2023 with bilateral sym lat cp just with coughing fits   Sleep: much worse when try to sleep flat so using recliner x 45 degrees  SABA use: neb 3 x daily  02: none  Vax never vax/ once infected   Rec Pantoprazole  (protonix ) 40 mg   Take  30-60 min before first meal of the day and add  Pepcid  (famotidine )  20 mg after supper until return to office - this is the best way to tell whether stomach acid is contributing to your problem.   Stop all smoking Stop lisinopil and anoro  Just use your nebulizer up every 4 hours as needed for cough or breathing problems  Diovan  160 (valsartan ) one daily  -  ok to break in half if too strong Gabapentin  100 mg increased to 4 x daily  For cough > mucinex  dm 1200 mg every 12 hours and avoid mint menthol chocolate  Return with all  inhalers/ solutions in hand       07/09/2022  f/u ov/[place] office/Yon Schiffman re: ? Acei case maint on did not  bring meds  Chief Complaint  Patient presents with   Follow-up  Dyspnea:  rollator  limited by back x 7 min  Cough: non productive, has not tried mucinex  dm yet Sleeping: in bed 45 degrees - no longer in recliner  SABA use: already used 4 h prior to ov 02: none   Rec My office will be contacting you by phone for referral for lung cancer  screening  > did not do as of 10/08/2022  Take two of gabapentin  in evening should help with cough and can adjust upward with your pain doctor 07/12/22 if needed For cough/ congestion >  mucinex  dm  up to maximum of  1200 mg every 12 hours as needed    The key is to stop smoking completely before smoking completely stops you!  Please schedule a follow up visit in 3 months but call sooner if needed with pfts on return   10/08/2022  f/u ov/[place] office/Kelliann Pendergraph re: GOLD ? / still smoking  maint on prn neb   Chief Complaint  Patient presents with   Upper airway cough syndrome   Dyspnea: using  rollator due to back pain  / mailbox and return  ok  50 ft flat  Cough: none  Sleeping: 45 degrees s  resp cc  SABA use: neb every few weeks  02: none  Rec The key is to stop smoking LDSCT  12/01/22 no nodules/  Mild diffuse bronchial wall thickening with mild centrilobular and paraseptal emphysema  -  PFT's  12/30/22  FEV1 2.62 (75 % ) ratio 0.77  p 0 % improvement from saba p 0 prior to study with DLCO  18.11 (69%)   and FV curve nl  and ERV 51 at wt 194     -    06/15/2023  f/u ov/[place] office/Jobina Maita re: GOLD 0 copd/ mild emphysema on CT  maint on no resp rx   Chief Complaint  Patient presents with   Shortness of Breath   COPD   Follow-up  Dyspnea:  Not limited by breathing from desired activities   Cough: slt smoker's rattle  Sleeping: flat bed with bunch of pillows on side s    resp cc  SABA use: not using  02: none       No obvious day to day or daytime variability or assoc excess/ purulent sputum or mucus plugs or hemoptysis or cp or chest tightness, subjective wheeze or overt sinus or hb symptoms.    Also denies any obvious fluctuation of symptoms with weather or environmental changes or other aggravating or alleviating factors except as outlined above   No unusual exposure hx or h/o childhood pna/ asthma or knowledge of premature birth.  Current Allergies, Complete Past  Medical History, Past Surgical History, Family History, and Social History were reviewed in [facility] record.  ROS  The following are not active complaints unless bolded Hoarseness, sore throat, dysphagia, dental problems, itching, sneezing,  nasal congestion or discharge of excess mucus or purulent secretions, ear ache,   fever, chills, sweats, unintended wt loss or wt gain, classically pleuritic or exertional cp,  orthopnea pnd or arm/hand swelling  or leg swelling, presyncope, palpitations, abdominal pain, anorexia, nausea, vomiting, diarrhea  or change in bowel habits or change in bladder habits, change in stools or change in urine, dysuria, hematuria,  rash, arthralgias, visual complaints, headache, numbness, weakness or ataxia or problems with walking or coordination,  change in mood or  memory.        Current Meds  Medication Sig   aspirin  EC 81 MG tablet Take 1 tablet (81 mg total) by mouth daily with breakfast. For Stroke Prevention   atorvastatin  (LIPITOR) 40 MG tablet TAKE 1 TABLET BY MOUTH EVERY DAY   baclofen  (LIORESAL ) 10 MG tablet Take 1 tablet (10 mg total) by mouth 2 (two) times daily.   buprenorphine  (SUBUTEX ) 8 MG SUBL SL tablet Place 4 mg under the tongue in the morning, at noon, in the evening, and at bedtime.   famotidine  (PEPCID ) 20 MG tablet TAKE 1 TABLET BY MOUTH EVERY DAY AFTER SUPPER   gabapentin  (NEURONTIN ) 100 MG capsule Take 1 capsule (100 mg total) by mouth 4 (four) times daily. (Patient taking differently: Take 100 mg by mouth 2 (two) times daily.)   ibuprofen  (ADVIL ) 200 MG tablet Take 200 mg by mouth every 6 (six) hours as needed for moderate pain (pain score 4-6) or headache.   ketoconazole  (NIZORAL ) 2 % cream APPLY 1 APPLICATION TOPICALLY DAILY   losartan  (COZAAR ) 25 MG tablet Take 1 tablet (25 mg total) by mouth daily.   nitrofurantoin  (MACRODANTIN ) 100 MG capsule Take 1 capsule (100 mg total) by mouth daily.   pantoprazole  (PROTONIX ) 40  MG tablet TAKE 1 TABLET BY MOUTH EVERY DAY   polyethylene glycol powder (GLYCOLAX /MIRALAX ) 17 GM/SCOOP powder Take 17 g by mouth daily. (Patient taking differently: Take 17 g by mouth daily as needed for moderate constipation.)   SUBOXONE  8-2 MG FILM Place 0.5 Film under the tongue every 6 (six) hours.  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   Qualifier: Diagnosis of  By: Lyndel Sanes     Chronic low back pain    Cocaine abuse (HCC)    COPD (chronic obstructive pulmonary disease) (HCC)    Heart murmur    HLD (hyperlipidemia)    Hypertension    Stroke (HCC)    x4     Objective:    Wts  06/15/2023         195  10/08/2022       195  07/09/22 189 lb 3.2 oz (85.8 kg)  06/23/22 197 lb (89.4 kg)  06/15/22 196 lb (88.9 kg)    Vital signs reviewed  06/15/2023  - Note at rest 02 sats  95% on RA   General appearance:    amb wf /all smiles    edentulous ***   HEENT : Oropharynx  clear/ edentulous      NECK :  without  apparent JVD/ palpable Nodes/TM    LUNGS: no acc muscle use,  Min barrel  contour chest wall with bilateral  slightly decreased bs s audible wheeze and  without cough on insp or exp maneuvers and min  Hyperresonant  to  percussion bilaterally    CV:  RRR  no s3 or murmur or increase in P2, and no edema   ABD:  soft and nontender   MS:  Nl gait/ ext warm without deformities Or obvious joint restrictions  calf tenderness, cyanosis or clubbing     SKIN: warm and dry without lesions    NEURO:  alert, approp, nl sensorium with  no motor or cerebellar deficits apparent.                Assessment

## 2023-06-15 ENCOUNTER — Encounter: Payer: Self-pay | Admitting: Internal Medicine

## 2023-06-15 ENCOUNTER — Ambulatory Visit: Admitting: Internal Medicine

## 2023-06-15 VITALS — BP 138/85 | HR 77 | Ht 72.0 in | Wt 195.8 lb

## 2023-06-15 DIAGNOSIS — F17210 Nicotine dependence, cigarettes, uncomplicated: Secondary | ICD-10-CM

## 2023-06-15 DIAGNOSIS — J449 Chronic obstructive pulmonary disease, unspecified: Secondary | ICD-10-CM

## 2023-06-15 NOTE — Patient Instructions (Signed)
 The key is to stop smoking completely before smoking completely stops you -its not too late!   If you are satisfied with your treatment plan,  let your doctor know and he/she can either refill your medications or you can return here when your prescription runs out.     If in any way you are not 100% satisfied,  please tell us .  If 100% better, tell your friends!  Pulmonary follow up is as needed

## 2023-06-16 ENCOUNTER — Other Ambulatory Visit: Payer: Self-pay | Admitting: Family Medicine

## 2023-06-16 DIAGNOSIS — I10 Essential (primary) hypertension: Secondary | ICD-10-CM

## 2023-06-16 NOTE — Assessment & Plan Note (Signed)
 Referred 10/08/2022 for lung cancer screening   - Counseled re importance of smoking cessation but did not meet time criteria for separate billing      - Low-dose CT lung cancer screening is recommended for patients who are 44-58 years of age with a 20+ pack-year history of smoking and who are currently smoking or quit <=15 years ago. No coughing up blood  No unintentional weight loss of > 15 pounds in the last 6 months - pt is eligible for scanning yearly until 101 y after she quits smoking > referred    Each maintenance medication was reviewed in detail including emphasizing most importantly the difference between maintenance and prns and under what circumstances the prns are to be triggered using an action plan format where appropriate.   Total time for H and P, chart review, counseling, reviewing hfa/neb device(s) , directly observing portions of ambulatory 02 saturation study/ and generating customized AVS unique to this office visit / same day charting  >  30 min

## 2023-06-16 NOTE — Assessment & Plan Note (Addendum)
 Active smoker with main concern cough as of 01/14/22  - 01/14/22 d/c acei > cough resolved then recurred on macrodantin  > resolved again  - 10/08/2022   Walked on RA  x  2  lap(s) =  approx 300  ft  @  back pain slow  pace, stopped due to back pain with lowest 02 sats 97%   - LDSCT  12/01/22 mild centrilobular and paraseptal emphysema  -  PFT's  12/30/22  FEV1 2.62 (75 % ) ratio 0.77  p 0 % improvement from saba p 0 prior to study with DLCO  18.11 (69%)   and FV curve nl  and ERV 51% at wt 194     Cough resolved to her satisfaction, on no maint resp rx and really not limited by doe so pulmonary f/u can be prn          Each maintenance medication was reviewed in detail including emphasizing most importantly the difference between maintenance and prns and under what circumstances the prns are to be triggered using an action plan format where appropriate.  Total time for H and P, chart review, counseling,  and generating customized AVS unique to this office visit / same day charting = 20 min summary final f/u ov

## 2023-06-16 NOTE — Assessment & Plan Note (Signed)
 Referred 10/08/2022 for lung cancer screening - due q November  4-5 min discussion re active cigarette smoking in addition to office E&M  Ask about tobacco use:   ongoing  Advise quitting   I took an extended  opportunity with this patient to outline the consequences of continued cigarette use  in airway disorders based on all the data we have from the multiple national lung health studies (perfomed over decades at millions of dollars in cost)  indicating that smoking cessation, not choice of inhalers or pulmonary physicians, is the most important aspect of her  care.   Assess willingness:  Not fully committed at this point Assist in quit attempt:  Per PCP when ready Arrange follow up:   Follow up per Primary Care planned

## 2023-06-26 ENCOUNTER — Telehealth: Admitting: Physician Assistant

## 2023-06-26 ENCOUNTER — Encounter: Payer: Self-pay | Admitting: Family Medicine

## 2023-06-26 ENCOUNTER — Encounter: Payer: Self-pay | Admitting: Physician Assistant

## 2023-06-26 DIAGNOSIS — B029 Zoster without complications: Secondary | ICD-10-CM | POA: Diagnosis not present

## 2023-06-26 MED ORDER — VALACYCLOVIR HCL 1 G PO TABS: 1000.0000 mg | ORAL_TABLET | Freq: Three times a day (TID) | ORAL | 0 refills | Status: AC

## 2023-06-26 NOTE — Progress Notes (Signed)
 Virtual Visit Consent   LOWEN MANSOURI, you are scheduled for a virtual visit with a [facility] provider today. Just as with appointments in the office, your consent must be obtained to participate. Your consent will be active for this visit and any virtual visit you may have with one of our providers in the next 365 days. If you have a MyChart account, a copy of this consent can be sent to you electronically.  As this is a virtual visit, video technology does not allow for your provider to perform a traditional examination. This may limit your provider's ability to fully assess your condition. If your provider identifies any concerns that need to be evaluated in person or the need to arrange testing (such as labs, EKG, etc.), we will make arrangements to do so. Although advances in technology are sophisticated, we cannot ensure that it will always work on either your end or our end. If the connection with a video visit is poor, the visit may have to be switched to a telephone visit. With either a video or telephone visit, we are not always able to ensure that we have a secure connection.  By engaging in this virtual visit, you consent to the provision of healthcare and authorize for your insurance to be billed (if applicable) for the services provided during this visit. Depending on your insurance coverage, you may receive a charge related to this service.  I need to obtain your verbal consent now. Are you willing to proceed with your visit today? EVALIE HARGRAVES has provided verbal consent on 06/26/2023 for a virtual visit (video or telephone). Hyla Maillard, [place]  Date: 06/26/2023 11:00 AM   Virtual Visit via Video Note   I, Hyla Maillard, connected with  MISTEE SOLIMAN  ([id number], Aug 02, 1965) on 06/26/23 at 10:45 AM EDT by a video-enabled telemedicine application and verified that I am speaking with the correct person using two identifiers.  Location: Patient: Virtual Visit Location  Patient: Home Provider: Virtual Visit Location Provider: Home Office   I discussed the limitations of evaluation and management by telemedicine and the availability of in person appointments. The patient expressed understanding and agreed to proceed.    History of Present Illness: BRITHANY WHITWORTH is a 58 y.o. who identifies as a female who was assigned female at birth, and is being seen today for blistering rash of her right shoulder blade extending around her right side, noted over the past couple of days.  Is not itching but is very irritated and burning.  Has applied some Desitin to the area.  Notes today having more blisters.   HPI: HPI  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 01/15/2022   GERD (gastroesophageal reflux disease) 07/24/2021   Depression, major, single episode, moderate (HCC) 10/22/2020   GAD (generalized anxiety disorder) 11/01/2018   Vitamin D  deficiency 11/01/2018   H/o Prior ischemic stroke (HCC) 09/03/2016   History of stroke 05/09/2016   History of substance abuse (HCC) 05/09/2016   Heart murmur 01/08/2016   Late effects of cerebral ischemic  stroke 10/04/2015   Dyslipidemia 10/04/2015   HTN (hypertension) 10/04/2015   Discitis 10/04/2015   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/08/2015   Postlaminectomy syndrome, lumbar region 12/04/2012   Anemia 06/17/2012   Chronic low back pain 06/17/2012   DDD (degenerative disc disease), lumbosacral 06/17/2012   Sacroiliitis (HCC) 06/17/2012    Precordial pain 02/18/2010   Cigarette smoker 12/05/2009   Anxiety state 12/05/2009   Palpitations 12/05/2009    Allergies:  Allergies  Allergen Reactions   Benadryl [Diphenhydramine Hcl] Hives   Codeine Rash   Tetracycline Itching and Rash   Medications:  Current Outpatient Medications:    valACYclovir (VALTREX) 1000 MG tablet, Take 1 tablet (1,000 mg total) by mouth 3 (three) times daily for 7 days., Disp: 21 tablet, Rfl: 0   aspirin  EC 81 MG tablet, Take 1 tablet (81 mg total) by mouth daily with breakfast. For Stroke Prevention, Disp: 30 tablet, Rfl: 11   atorvastatin  (LIPITOR) 40 MG tablet, TAKE 1 TABLET BY MOUTH EVERY DAY, Disp: 90 tablet, Rfl: 1   baclofen  (LIORESAL ) 10 MG tablet, Take 1 tablet (10 mg total) by mouth 2 (two) times daily., Disp: 60 tablet, Rfl: 3   buprenorphine  (SUBUTEX ) 8 MG SUBL SL tablet, Place 4 mg under the tongue in the morning, at noon, in the evening, and at bedtime., Disp: , Rfl:    famotidine  (PEPCID ) 20 MG tablet, TAKE 1 TABLET BY MOUTH EVERY DAY AFTER SUPPER, Disp: 30 tablet, Rfl: 11   gabapentin  (NEURONTIN ) 100 MG capsule, Take 1 capsule (100 mg total) by mouth 4 (four) times daily. (Patient taking differently: Take 100 mg by mouth 2 (two) times daily.), Disp: 120 capsule, Rfl: 2   ibuprofen  (ADVIL ) 200 MG tablet, Take 200 mg by mouth every 6 (six) hours as needed for moderate pain (pain score 4-6) or headache., Disp: , Rfl:    ketoconazole  (NIZORAL ) 2 % cream, APPLY 1 APPLICATION TOPICALLY DAILY, Disp: 30 g, Rfl: 0   losartan  (COZAAR ) 25 MG tablet, TAKE 1 TABLET (25 MG TOTAL) BY MOUTH DAILY., Disp: 90 tablet, Rfl: 0   nitrofurantoin  (MACRODANTIN ) 100 MG capsule, Take 1 capsule (100 mg total) by mouth daily., Disp: 30 capsule, Rfl: 3   pantoprazole  (PROTONIX ) 40 MG tablet, TAKE 1 TABLET BY MOUTH EVERY DAY, Disp: 90 tablet, Rfl: 1   polyethylene glycol powder (GLYCOLAX /MIRALAX ) 17 GM/SCOOP powder, Take 17 g by mouth daily. (Patient taking differently:  Take 17 g by mouth daily as needed for moderate constipation.), Disp: 3350 g, Rfl: 1   SUBOXONE  8-2 MG FILM, Place 0.5 Film under the tongue every 6 (six) hours., Disp: , Rfl:   Observations/Objective: Patient is well-developed, well-nourished in no acute distress.  Resting comfortably at home.  Head is normocephalic, atraumatic.  No labored breathing. Speech is clear and coherent with logical content.  Patient is alert and oriented at baseline.       Assessment and Plan: 1. Herpes zoster without complication (Primary) - valACYclovir (VALTREX) 1000 MG tablet; Take 1 tablet (1,000 mg total) by mouth 3 (three) times daily for 7 days.  Dispense: 21 tablet; Refill: 0  Supportive measures and OTC medications reviewed.  She is already taking gabapentin , can increase to 100 mg 3 times daily to further help with pain from shingles.  Valtrex per orders.  PCP follow-up discussed.  Follow Up Instructions: I discussed the assessment and treatment plan with the patient. The patient was provided an opportunity to ask questions and all were  answered. The patient agreed with the plan and demonstrated an understanding of the instructions.  A copy of instructions were sent to the patient via MyChart unless otherwise noted below.   The patient was advised to call back or seek an in-person evaluation if the symptoms worsen or if the condition fails to improve as anticipated.    Hyla Maillard, PA-C

## 2023-06-26 NOTE — Patient Instructions (Signed)
 Alice [name], thank you for joining Hyla Maillard, PA-C for today's virtual visit.  While this provider is not your primary care provider (PCP), if your PCP is located in our provider database this encounter information will be shared with them immediately following your visit.   A [facility] MyChart account gives you access to today's visit and all your visits, tests, and labs performed at [facility]  click here if you don't have a [facility] MyChart account or go to [email or web address]  Consent: (Patient) Alice [name] provided verbal consent for this virtual visit at the beginning of the encounter.  Current Medications:  Current Outpatient Medications:    aspirin  EC 81 MG tablet, Take 1 tablet (81 mg total) by mouth daily with breakfast. For Stroke Prevention, Disp: 30 tablet, Rfl: 11   atorvastatin  (LIPITOR) 40 MG tablet, TAKE 1 TABLET BY MOUTH EVERY DAY, Disp: 90 tablet, Rfl: 1   baclofen  (LIORESAL ) 10 MG tablet, Take 1 tablet (10 mg total) by mouth 2 (two) times daily., Disp: 60 tablet, Rfl: 3   buprenorphine  (SUBUTEX ) 8 MG SUBL SL tablet, Place 4 mg under the tongue in the morning, at noon, in the evening, and at bedtime., Disp: , Rfl:    famotidine  (PEPCID ) 20 MG tablet, TAKE 1 TABLET BY MOUTH EVERY DAY AFTER SUPPER, Disp: 30 tablet, Rfl: 11   gabapentin  (NEURONTIN ) 100 MG capsule, Take 1 capsule (100 mg total) by mouth 4 (four) times daily. (Patient taking differently: Take 100 mg by mouth 2 (two) times daily.), Disp: 120 capsule, Rfl: 2   ibuprofen  (ADVIL ) 200 MG tablet, Take 200 mg by mouth every 6 (six) hours as needed for moderate pain (pain score 4-6) or headache., Disp: , Rfl:    ketoconazole  (NIZORAL ) 2 % cream, APPLY 1 APPLICATION TOPICALLY DAILY, Disp: 30 g, Rfl: 0   losartan  (COZAAR ) 25 MG tablet, TAKE 1 TABLET (25 MG TOTAL) BY MOUTH DAILY., Disp: 90 tablet, Rfl: 0   nitrofurantoin  (MACRODANTIN ) 100 MG capsule, Take 1 capsule (100 mg total) by  mouth daily., Disp: 30 capsule, Rfl: 3   pantoprazole  (PROTONIX ) 40 MG tablet, TAKE 1 TABLET BY MOUTH EVERY DAY, Disp: 90 tablet, Rfl: 1   polyethylene glycol powder (GLYCOLAX /MIRALAX ) 17 GM/SCOOP powder, Take 17 g by mouth daily. (Patient taking differently: Take 17 g by mouth daily as needed for moderate constipation.), Disp: 3350 g, Rfl: 1   SUBOXONE  8-2 MG FILM, Place 0.5 Film under the tongue every 6 (six) hours., Disp: , Rfl:    Medications ordered in this encounter:  No orders of the defined types were placed in this encounter.    *If you need refills on other medications prior to your next appointment, please contact your pharmacy*  Follow-Up: Call back or seek an in-person evaluation if the symptoms worsen or if the condition fails to improve as anticipated.  [facility] [phone]  Other Instructions Shingles  Shingles, or herpes zoster, is an infection. It gives you a skin rash and blisters. These infected areas may hurt a lot. Shingles only happens if: You've had chickenpox. You've been given a shot called a vaccine to protect you from getting chickenpox. Shingles is rare in this case. What are the causes? Shingles is caused by a germ called the varicella-zoster virus. This is the same germ that causes chickenpox. After you're exposed to the germ, it stays in your body but is dormant. This means it isn't active. Shingles happens if the germ  becomes active again. This can happen years after you're first exposed to the germ. What increases the risk? You may be more likely to get shingles if: You're older than 58 years of age. You're under a lot of stress. You have a weak immune system. The immune system is your body's defense system. It may be weak if: You have human immunodeficiency virus (HIV). You have acquired immunodeficiency syndrome (AIDS). You have cancer. You take medicines that weaken your immune system. These include organ transplant  medicines. What are the signs or symptoms? The first symptoms of shingles may be itching, tingling, or pain. Your skin may feel like it's burning. A few days or weeks later, you'll get a rash. Here's what you can expect: The rash is likely to be on one side of your body. The rash may be shaped like a belt or a band. Over time, it will turn into blisters filled with fluid. The blisters will break open and change into scabs. The scabs will dry up in about 2-3 weeks. You may also have: A fever. Chills. A headache. Nausea. How is this diagnosed? Shingles is diagnosed with a skin exam. A sample called a culture may be taken from one of your blisters and sent to a lab. This will show if you have shingles. How is this treated? The rash may last for several weeks. There's no cure for shingles, but your health care provider may give you medicines. These medicines may: Help with pain. Help with itching. Help with irritation and swelling. Help you get better sooner. Help to prevent long-term problems. If the rash is on your face, you may need to see an eye doctor or an ear, nose, and throat (ENT) doctor. Follow these instructions at home: Medicines Take your medicines only as told by your provider. Put an anti-itch cream or numbing cream on the rash or blisters as told by your provider. Relieving itching and discomfort  To help with itching: Put cold, wet cloths called cold compresses on the rash or blisters. Take a cool bath. Try adding baking soda or dry oatmeal to the water . Do not bathe in hot water . Use calamine lotion on the rash or blisters. You can get this type of lotion at the store. Blister and rash care Keep your rash covered with a loose bandage. Wear loose clothes that don't rub on your rash. Take care of your rash as told by your provider. Make sure you: Wash your hands with soap and water  for at least 20 seconds before and after you change your bandage. If you can't use soap  and water , use hand sanitizer. Keep your rash and blisters clean by washing them with mild soap and cool water . Change your bandage. Check your rash every day for signs of infection. Check for: More redness, swelling, or pain. Fluid or blood. Warmth. Pus or a bad smell. Do not scratch your rash. Do not pick at your blisters. To help you not scratch: Keep your fingernails clean and cut short. Try to wear gloves or mittens when you sleep. General instructions Rest. Wash your hands often with soap and water  for at least 20 seconds. If you can't use soap and water , use hand sanitizer. Washing your hands lowers your chance of getting a skin infection. Your infection can cause chickenpox in others. If you have blisters that aren't scabs yet, stay away from: Babies. Pregnant people. Children who have eczema. Older people who have organ transplants. People who have a long-term, or  chronic, illness. Anyone who hasn't had chickenpox before. Anyone who hasn't gotten the chickenpox vaccine. How is this prevented? Vaccines are the best way to prevent you from getting chickenpox or shingles. Talk with your provider about getting these shots. Where to find more information Centers for Disease Control and Prevention (CDC): TonerPromos.no Contact a health care provider if: Your pain doesn't get better with medicine. Your pain doesn't get better after the rash heals. You have any signs of infection around the rash. Your rash or blisters get worse. You have a fever or chills. Get help right away if: The rash is on your face or nose. You have pain in your face or by your eye. You lose feeling on one side of your face. You have trouble seeing. You have ear pain or ringing in your ear. This information is not intended to replace advice given to you by your health care provider. Make sure you discuss any questions you have with your health care provider. Document Revised: 09/30/2022 Document Reviewed:  02/12/2022 Elsevier Patient Education  2024 Elsevier Inc.   If you have been instructed to have an in-person evaluation today at a local Urgent Care facility, please use the link below. It will take you to a list of all of our available [facility], including address, phone number and hours of operation. Please do not delay care.  [facility]  If you or a family member do not have a primary care provider, use the link below to schedule a visit and establish care. When you choose a [facility] primary care physician or advanced practice provider, you gain a long-term partner in health. Find a Primary Care Provider  Learn more about [facility]'s in-office and virtual care options:  - Get Care Now

## 2023-06-27 ENCOUNTER — Ambulatory Visit: Payer: Self-pay

## 2023-06-27 ENCOUNTER — Telehealth: Payer: Self-pay | Admitting: Family Medicine

## 2023-06-27 ENCOUNTER — Other Ambulatory Visit: Payer: Self-pay | Admitting: Family Medicine

## 2023-06-27 MED ORDER — PREDNISONE 20 MG PO TABS: ORAL_TABLET | ORAL | 0 refills | Status: DC

## 2023-06-27 NOTE — Telephone Encounter (Signed)
  FYI Only or Action Required?: Action required by provider  Patient was last seen in primary care on 03/31/2023 by Galvin Jules, FNP. Called Nurse Triage reporting Herpes Zoster. Symptoms began today. Interventions attempted: Nothing. Symptoms are: unchanged.  Triage Disposition: See PCP Within 2 Weeks  Patient/caregiver understands and will follow disposition?: Yes Would like something called in for the itching.  Patient does not have transportation.  Copied from CRM [id number]. Topic: Clinical - Red Word Triage >> Jun 27, 2023  2:41 PM Alice [name] wrote: Red Word that prompted transfer to Nurse Triage:  patient called earlier CRM was made Patient states she was seen via telehealth yesterday 06/15 and was diagnosed with having shingles. Patient is stating that she believes it has spread to her scalp (left side), it has been hard to comb her hair this morning but husband is stating that he does not see anything. She wants to ask if the antibiotics she was put on are good for what she has going on.  Calling in now with pain and extreme itching, crying, wanting to speak with the nurse. Reason for Disposition  [1] Shingles rash already diagnosed AND [2] weak immune system (e.g., HIV positive, cancer chemotherapy, chronic steroid treatment, splenectomy) AND [3] taking antiviral medication  Answer Assessment - Initial Assessment Questions 1. APPEARANCE of RASH: Describe the rash.      Diagnosied with shingles and currently on medication 2. LOCATION: Where is the rash located?      Right shoulder 3. ONSET: When did the rash start?      Last week 4. ITCHING: Does the rash itch? If Yes, ask: How bad is the itch?  (Scale 1-10; or mild, moderate, severe)     10/10 5. PAIN: Does the rash hurt? If Yes, ask: How bad is the pain?  (Scale 0-10; or none, mild, moderate, severe)    - NONE (0): no pain    - MILD (1-3): doesn't interfere with normal activities     - MODERATE (4-7): interferes  with normal activities or awakens from sleep     - SEVERE (8-10): excruciating pain, unable to do any normal activities     Yes, moderate 6. OTHER SYMPTOMS: Do you have any other symptoms? (e.g., fever)     no 7. PREGNANCY: Is there any chance you are pregnant? When was your last menstrual period?     na  Protocols used: Shingles (Zoster)-A-AH

## 2023-06-27 NOTE — Telephone Encounter (Signed)
 Pt notified. lS

## 2023-06-27 NOTE — Telephone Encounter (Unsigned)
 Copied from CRM [id number]. Topic: Clinical - Medical Advice >> Jun 27, 2023 10:11 AM Alice [name] wrote: Reason for CRM: Patient states she was seen via telehealth yesterday 06/15 and was diagnosed with having shingles. Patient is stating that she believes it has spread to her scalp (left side), it has been hard to comb her hair this morning but husband is stating that he does not see anything. She wants to ask if the antibiotics she was put on are good for what she has going on. Please advise, # [phone]

## 2023-06-27 NOTE — Telephone Encounter (Signed)
 She was given the correct antibiotic. She can soak in Aveeno for relief of itching, burning. I sent in prednisone  to help with that to [facility]

## 2023-06-27 NOTE — Telephone Encounter (Signed)
 Scheduled for tomorrow. LS

## 2023-06-28 ENCOUNTER — Telehealth: Admitting: Family Medicine

## 2023-06-28 ENCOUNTER — Encounter: Payer: Self-pay | Admitting: Family Medicine

## 2023-06-28 DIAGNOSIS — B029 Zoster without complications: Secondary | ICD-10-CM | POA: Diagnosis not present

## 2023-06-28 NOTE — Progress Notes (Signed)
 Virtual Visit via Video   I connected with patient on 06/28/23 at 1145 by a video enabled telemedicine application and verified that I am speaking with the correct person using two identifiers.  Location patient: Home Location provider: [facility] Persons participating in the virtual visit: Patient and Provider  I discussed the limitations of evaluation and management by telemedicine and the availability of in person appointments. The patient expressed understanding and agreed to proceed.  Subjective:   HPI:  Pt presents today for  Chief Complaint  Patient presents with   Rash  Alice [name] is a 58 year old female who presents with a shingles rash on her right shoulder. She is accompanied by Bud, her partner.  The rash began on Saturday as a spot about the size of a 50 cent piece and rapidly progressed. By Sunday, she experienced intense pruritus, describing it as feeling like 'a hundred bugs under my skin'. The rash is located on her right shoulder and has spread under her right breast and to her right abdomen, following a dermatomal pattern.  She has been using calamine lotion and Sarna lotion to manage the itching, which provides some relief. Additionally, she uses a fan to dry the area and applies powder to prevent sweating. She places a napkin under her breast to absorb moisture.  She started prednisone  yesterday, taking one tablet every twelve hours instead of the prescribed two at once due to previous adverse reactions. She is also on valacyclovir and has been compliant with the dosing regimen since the diagnosis. She continues to take gabapentin , prescribed at three times a day, but she only takes it twice daily.       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 01/15/2022   GERD (gastroesophageal reflux disease) 07/24/2021   Depression, major, single episode, moderate (HCC) 10/22/2020   GAD (generalized anxiety disorder) 11/01/2018   Vitamin D  deficiency 11/01/2018   H/o Prior ischemic stroke (HCC) 09/03/2016   History of stroke 05/09/2016   History of substance abuse (HCC) 05/09/2016   Heart murmur 01/08/2016   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   Dyslipidemia 10/04/2015   HTN (hypertension) 10/04/2015   Discitis 10/04/2015   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/08/2015   Postlaminectomy syndrome, lumbar region 12/04/2012   Anemia 06/17/2012   Chronic low back pain 06/17/2012   DDD (degenerative disc disease), lumbosacral 06/17/2012   Sacroiliitis (HCC) 06/17/2012   Precordial pain 02/18/2010   Cigarette smoker 12/05/2009   Anxiety state 12/05/2009   Palpitations 12/05/2009    Social History   Tobacco Use   Smoking status: Every Day    Current packs/day: 0.50    Average packs/day: 0.5 packs/day for 40.0 years (20.0 ttl pk-yrs)    Types: Cigarettes    Passive exposure: Current   Smokeless tobacco: Never  Substance Use Topics   Alcohol use: Not Currently    Comment: today    Current Outpatient Medications:    aspirin  EC 81 MG tablet, Take 1 tablet (81  mg total) by mouth daily with breakfast. For Stroke Prevention, Disp: 30 tablet, Rfl: 11   atorvastatin  (LIPITOR) 40 MG tablet, TAKE 1 TABLET BY MOUTH EVERY DAY, Disp: 90 tablet, Rfl: 1   baclofen  (LIORESAL ) 10 MG tablet, Take 1 tablet (10 mg total) by mouth 2 (two) times daily., Disp: 60 tablet, Rfl: 3   buprenorphine  (SUBUTEX ) 8 MG SUBL SL tablet, Place 4 mg  under the tongue in the morning, at noon, in the evening, and at bedtime., Disp: , Rfl:    famotidine  (PEPCID ) 20 MG tablet, TAKE 1 TABLET BY MOUTH EVERY DAY AFTER SUPPER, Disp: 30 tablet, Rfl: 11   gabapentin  (NEURONTIN ) 100 MG capsule, Take 1 capsule (100 mg total) by mouth 4 (four) times daily. (Patient taking differently: Take 100 mg by mouth 2 (two) times daily.), Disp: 120 capsule, Rfl: 2   ibuprofen  (ADVIL ) 200 MG tablet, Take 200 mg by mouth every 6 (six) hours as needed for moderate pain (pain score 4-6) or headache., Disp: , Rfl:    ketoconazole  (NIZORAL ) 2 % cream, APPLY 1 APPLICATION TOPICALLY DAILY, Disp: 30 g, Rfl: 0   losartan  (COZAAR ) 25 MG tablet, TAKE 1 TABLET (25 MG TOTAL) BY MOUTH DAILY., Disp: 90 tablet, Rfl: 0   nitrofurantoin  (MACRODANTIN ) 100 MG capsule, Take 1 capsule (100 mg total) by mouth daily., Disp: 30 capsule, Rfl: 3   pantoprazole  (PROTONIX ) 40 MG tablet, TAKE 1 TABLET BY MOUTH EVERY DAY, Disp: 90 tablet, Rfl: 1   polyethylene glycol powder (GLYCOLAX /MIRALAX ) 17 GM/SCOOP powder, Take 17 g by mouth daily. (Patient taking differently: Take 17 g by mouth daily as needed for moderate constipation.), Disp: 3350 g, Rfl: 1   predniSONE  (DELTASONE ) 20 MG tablet, 2 po at same time daily for 5 days, Disp: 10 tablet, Rfl: 0   SUBOXONE  8-2 MG FILM, Place 0.5 Film under the tongue every 6 (six) hours., Disp: , Rfl:    valACYclovir (VALTREX) 1000 MG tablet, Take 1 tablet (1,000 mg total) by mouth 3 (three) times daily for 7 days., Disp: 21 tablet, Rfl: 0  Allergies  Allergen Reactions   Benadryl [Diphenhydramine Hcl] Hives   Codeine Rash   Tetracycline Itching and Rash    Objective:   LMP 07/30/2011   Patient is well-developed, well-nourished in no acute distress.  Resting comfortably at home.  Head is normocephalic, atraumatic.  No labored breathing.  Speech is clear and coherent with logical content.  Patient is alert and oriented at baseline.  Grouped erythematous  papules forming plaques in a dermatome pattern  Assessment and Plan:   Alice [name] was seen today for rash.  Diagnoses and all orders for this visit:  Herpes zoster without complication      Herpes Zoster (Shingles) Acute herpes zoster infection with a rash on the right shoulder, spreading to the right breast and abdomen. The rash is characterized by blisters and intense pruritus, consistent with shingles. She is experiencing neuropathic pain associated with the condition. Currently on valacyclovir and prednisone , using calamine lotion and Sarna for symptomatic relief. Gabapentin  is prescribed for neuropathic pain management, with the option to increase the dose if pain worsens. The typical course of shingles is 10-14 days, during which the rash may spread and then begin to heal. - Continue valacyclovir as prescribed - Adjust prednisone  dose as tolerated - Use calamine lotion and Sarna for pruritus - Consider increasing gabapentin  to three times daily if pain worsens - Educate on typical course of shingles (10-14 days)  Return if symptoms worsen or fail to improve.  Kattie Parrot, FNP-C [facility] [address] [place], [place] [address] [phone]  06/28/2023  Time spent with the patient: 15 minutes, of which >50% was spent in obtaining information about symptoms, reviewing previous labs, evaluations, and treatments, counseling about condition (please see the discussed topics above), and developing a plan to further investigate it; had a number of questions which I addressed.

## 2023-07-06 ENCOUNTER — Encounter: Payer: Self-pay | Admitting: Family Medicine

## 2023-07-07 ENCOUNTER — Other Ambulatory Visit: Payer: Self-pay | Admitting: Family Medicine

## 2023-07-07 DIAGNOSIS — E782 Mixed hyperlipidemia: Secondary | ICD-10-CM

## 2023-07-07 DIAGNOSIS — K219 Gastro-esophageal reflux disease without esophagitis: Secondary | ICD-10-CM

## 2023-07-09 ENCOUNTER — Telehealth: Admitting: Nurse Practitioner

## 2023-07-09 DIAGNOSIS — I959 Hypotension, unspecified: Secondary | ICD-10-CM | POA: Diagnosis not present

## 2023-07-09 NOTE — Patient Instructions (Signed)
 Alice [name], thank you for joining Haze LELON Servant, NP for today's virtual visit.  While this provider is not your primary care provider (PCP), if your PCP is located in our provider database this encounter information will be shared with them immediately following your visit.   A [facility] MyChart account gives you access to today's visit and all your visits, tests, and labs performed at [facility]  click here if you don't have a [facility] MyChart account or go to [email or web address]  Consent: (Patient) Alice [name] provided verbal consent for this virtual visit at the beginning of the encounter.  Current Medications:  Current Outpatient Medications:    aspirin  EC 81 MG tablet, Take 1 tablet (81 mg total) by mouth daily with breakfast. For Stroke Prevention, Disp: 30 tablet, Rfl: 11   atorvastatin  (LIPITOR) 40 MG tablet, Take 1 tablet (40 mg total) by mouth daily. **NEEDS TO BE SEEN BEFORE NEXT REFILL**, Disp: 30 tablet, Rfl: 0   baclofen  (LIORESAL ) 10 MG tablet, Take 1 tablet (10 mg total) by mouth 2 (two) times daily., Disp: 60 tablet, Rfl: 3   buprenorphine  (SUBUTEX ) 8 MG SUBL SL tablet, Place 4 mg under the tongue in the morning, at noon, in the evening, and at bedtime., Disp: , Rfl:    famotidine  (PEPCID ) 20 MG tablet, TAKE 1 TABLET BY MOUTH EVERY DAY AFTER SUPPER, Disp: 30 tablet, Rfl: 11   gabapentin  (NEURONTIN ) 100 MG capsule, Take 1 capsule (100 mg total) by mouth 4 (four) times daily. (Patient taking differently: Take 100 mg by mouth 2 (two) times daily.), Disp: 120 capsule, Rfl: 2   ibuprofen  (ADVIL ) 200 MG tablet, Take 200 mg by mouth every 6 (six) hours as needed for moderate pain (pain score 4-6) or headache., Disp: , Rfl:    ketoconazole  (NIZORAL ) 2 % cream, APPLY 1 APPLICATION TOPICALLY DAILY, Disp: 30 g, Rfl: 0   losartan  (COZAAR ) 25 MG tablet, TAKE 1 TABLET (25 MG TOTAL) BY MOUTH DAILY., Disp: 90 tablet, Rfl: 0   nitrofurantoin  (MACRODANTIN ) 100 MG  capsule, Take 1 capsule (100 mg total) by mouth daily., Disp: 30 capsule, Rfl: 3   pantoprazole  (PROTONIX ) 40 MG tablet, Take 1 tablet (40 mg total) by mouth daily. **NEEDS TO BE SEEN BEFORE NEXT REFILL**, Disp: 30 tablet, Rfl: 0   polyethylene glycol powder (GLYCOLAX /MIRALAX ) 17 GM/SCOOP powder, Take 17 g by mouth daily. (Patient taking differently: Take 17 g by mouth daily as needed for moderate constipation.), Disp: 3350 g, Rfl: 1   predniSONE  (DELTASONE ) 20 MG tablet, 2 po at same time daily for 5 days, Disp: 10 tablet, Rfl: 0   SUBOXONE  8-2 MG FILM, Place 0.5 Film under the tongue every 6 (six) hours., Disp: , Rfl:    Medications ordered in this encounter:  No orders of the defined types were placed in this encounter.    *If you need refills on other medications prior to your next appointment, please contact your pharmacy*  Follow-Up: Call back or seek an in-person evaluation if the symptoms worsen or if the condition fails to improve as anticipated.  [facility] [phone]  Other Instructions Hold losartan  for BP <130/80 Follow up with PCP on Monday    If you have been instructed to have an in-person evaluation today at a local Urgent Care facility, please use the link below. It will take you to a list of all of our available [facility], including address, phone number and hours  of operation. Please do not delay care.  [facility]  If you or a family member do not have a primary care provider, use the link below to schedule a visit and establish care. When you choose a [facility] primary care physician or advanced practice provider, you gain a long-term partner in health. Find a Primary Care Provider  Learn more about [facility] in-office and virtual care options: [facility] - Get Care Now

## 2023-07-09 NOTE — Progress Notes (Signed)
 Virtual Visit Consent   Alice [name], you are scheduled for a virtual visit with a [facility] provider today. Just as with appointments in the office, your consent must be obtained to participate. Your consent will be active for this visit and any virtual visit you may have with one of our providers in the next 365 days. If you have a MyChart account, a copy of this consent can be sent to you electronically.  As this is a virtual visit, video technology does not allow for your provider to perform a traditional examination. This may limit your provider's ability to fully assess your condition. If your provider identifies any concerns that need to be evaluated in person or the need to arrange testing (such as labs, EKG, etc.), we will make arrangements to do so. Although advances in technology are sophisticated, we cannot ensure that it will always work on either your end or our end. If the connection with a video visit is poor, the visit may have to be switched to a telephone visit. With either a video or telephone visit, we are not always able to ensure that we have a secure connection.  By engaging in this virtual visit, you consent to the provision of healthcare and authorize for your insurance to be billed (if applicable) for the services provided during this visit. Depending on your insurance coverage, you may receive a charge related to this service.  I need to obtain your verbal consent now. Are you willing to proceed with your visit today? PEIGHTON MEHRA has provided verbal consent on 07/09/2023 for a virtual visit (video or telephone). Haze LELON Servant, NP  Date: 07/09/2023 10:01 AM   Virtual Visit via Video Note   I, Haze LELON Servant, connected with  LOUNELL [name]  ([id number], Jun 14, 1965) on 07/09/23 at 10:00 AM EDT by a video-enabled telemedicine application and verified that I am speaking with the correct person using two identifiers.  Location: Patient: Virtual Visit Location Patient:  Home Provider: Virtual Visit Location Provider: Home Office   I discussed the limitations of evaluation and management by telemedicine and the availability of in person appointments. The patient expressed understanding and agreed to proceed.    History of Present Illness: Alice [name] is a 58 y.o. who identifies as a female who was assigned female at birth, and is being seen today for low blood pressure readings.  Ms Latendresse reports low blood pressure readings overnight.  8:30pm  92/60 8:40 105/76 9:38 78/58 9:39 96/71 Right arm - 9:41 87/65 10:04 109/72 10:05 90/67 10:21 pm 96/77 Currently her Blood pressure this morning 100/67 standing 103/73. Just recently 114/89 prior to virtual visit. States she was told by her HHA that her sodium might have been too low and to add a little salt to her food last night.    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 01/15/2022   GERD (gastroesophageal reflux disease) 07/24/2021   Depression, major, single episode, moderate (HCC) 10/22/2020   GAD (generalized anxiety disorder) 11/01/2018   Vitamin D  deficiency 11/01/2018  H/o Prior ischemic stroke (HCC) 09/03/2016   History of stroke 05/09/2016   History of substance abuse (HCC) 05/09/2016   Heart murmur 01/08/2016   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   Dyslipidemia 10/04/2015   HTN (hypertension) 10/04/2015   Discitis 10/04/2015   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/08/2015   Postlaminectomy  syndrome, lumbar region 12/04/2012   Anemia 06/17/2012   Chronic low back pain 06/17/2012   DDD (degenerative disc disease), lumbosacral 06/17/2012   Sacroiliitis (HCC) 06/17/2012   Precordial pain 02/18/2010   Cigarette smoker 12/05/2009   Anxiety state 12/05/2009   Palpitations 12/05/2009    Allergies:  Allergies  Allergen Reactions   Benadryl [Diphenhydramine Hcl] Hives   Codeine Rash   Tetracycline Itching and Rash   Medications:  Current Outpatient Medications:    aspirin  EC 81 MG tablet, Take 1 tablet (81 mg total) by mouth daily with breakfast. For Stroke Prevention, Disp: 30 tablet, Rfl: 11   atorvastatin  (LIPITOR) 40 MG tablet, Take 1 tablet (40 mg total) by mouth daily. **NEEDS TO BE SEEN BEFORE NEXT REFILL**, Disp: 30 tablet, Rfl: 0   baclofen  (LIORESAL ) 10 MG tablet, Take 1 tablet (10 mg total) by mouth 2 (two) times daily., Disp: 60 tablet, Rfl: 3   buprenorphine  (SUBUTEX ) 8 MG SUBL SL tablet, Place 4 mg under the tongue in the morning, at noon, in the evening, and at bedtime., Disp: , Rfl:    famotidine  (PEPCID ) 20 MG tablet, TAKE 1 TABLET BY MOUTH EVERY DAY AFTER SUPPER, Disp: 30 tablet, Rfl: 11   gabapentin  (NEURONTIN ) 100 MG capsule, Take 1 capsule (100 mg total) by mouth 4 (four) times daily. (Patient taking differently: Take 100 mg by mouth 2 (two) times daily.), Disp: 120 capsule, Rfl: 2   ibuprofen  (ADVIL ) 200 MG tablet, Take 200 mg by mouth every 6 (six) hours as needed for moderate pain (pain score 4-6) or headache., Disp: , Rfl:    ketoconazole  (NIZORAL ) 2 % cream, APPLY 1 APPLICATION TOPICALLY DAILY, Disp: 30 g, Rfl: 0   losartan  (COZAAR ) 25 MG tablet, TAKE 1 TABLET (25 MG TOTAL) BY MOUTH DAILY., Disp: 90 tablet, Rfl: 0   nitrofurantoin  (MACRODANTIN ) 100 MG capsule, Take 1 capsule (100 mg total) by mouth daily., Disp: 30 capsule, Rfl: 3   pantoprazole  (PROTONIX ) 40 MG tablet, Take 1 tablet (40 mg total) by mouth daily. **NEEDS TO BE SEEN BEFORE NEXT REFILL**,  Disp: 30 tablet, Rfl: 0   polyethylene glycol powder (GLYCOLAX /MIRALAX ) 17 GM/SCOOP powder, Take 17 g by mouth daily. (Patient taking differently: Take 17 g by mouth daily as needed for moderate constipation.), Disp: 3350 g, Rfl: 1   predniSONE  (DELTASONE ) 20 MG tablet, 2 po at same time daily for 5 days, Disp: 10 tablet, Rfl: 0   SUBOXONE  8-2 MG FILM, Place 0.5 Film under the tongue every 6 (six) hours., Disp: , Rfl:   Observations/Objective: Patient is well-developed, well-nourished in no acute distress.  Resting comfortably  at home.  Head is normocephalic, atraumatic.  No labored breathing.  She has dysarthria from previous stroke.  Coherent with logical content.  Patient is alert and oriented at baseline.    Assessment and Plan: 1. Hypotension, unspecified hypotension type (Primary) Hold losartan  for BP <130/80 Follow up with PCP on Monday   Follow Up Instructions: I discussed the assessment and treatment plan with the patient. The patient was provided an opportunity to ask questions and all were answered. The patient agreed with  the plan and demonstrated an understanding of the instructions.  A copy of instructions were sent to the patient via MyChart unless otherwise noted below.    The patient was advised to call back or seek an in-person evaluation if the symptoms worsen or if the condition fails to improve as anticipated.    Demetrica Zipp W Alexsa Flaum, NP

## 2023-07-11 ENCOUNTER — Encounter: Payer: Self-pay | Admitting: Family Medicine

## 2023-07-12 ENCOUNTER — Ambulatory Visit (HOSPITAL_COMMUNITY)

## 2023-07-14 ENCOUNTER — Ambulatory Visit (HOSPITAL_COMMUNITY)
Admission: RE | Admit: 2023-07-14 | Discharge: 2023-07-14 | Disposition: A | Discharge status: Home / Self Care | Source: Ambulatory Visit | Attending: Acute Care | Admitting: Acute Care

## 2023-07-14 DIAGNOSIS — Z87891 Personal history of nicotine dependence: Secondary | ICD-10-CM | POA: Diagnosis present

## 2023-07-14 DIAGNOSIS — R911 Solitary pulmonary nodule: Secondary | ICD-10-CM | POA: Diagnosis present

## 2023-07-15 ENCOUNTER — Other Ambulatory Visit: Payer: Self-pay | Admitting: Family Medicine

## 2023-07-15 DIAGNOSIS — L282 Other prurigo: Secondary | ICD-10-CM

## 2023-07-19 ENCOUNTER — Encounter: Payer: Self-pay | Admitting: Family Medicine

## 2023-07-25 ENCOUNTER — Other Ambulatory Visit: Payer: Self-pay

## 2023-07-25 DIAGNOSIS — Z87891 Personal history of nicotine dependence: Secondary | ICD-10-CM

## 2023-07-25 DIAGNOSIS — Z122 Encounter for screening for malignant neoplasm of respiratory organs: Secondary | ICD-10-CM

## 2023-07-25 DIAGNOSIS — F17210 Nicotine dependence, cigarettes, uncomplicated: Secondary | ICD-10-CM

## 2023-07-27 ENCOUNTER — Ambulatory Visit (INDEPENDENT_AMBULATORY_CARE_PROVIDER_SITE_OTHER): Admitting: Family Medicine

## 2023-07-27 ENCOUNTER — Ambulatory Visit (INDEPENDENT_AMBULATORY_CARE_PROVIDER_SITE_OTHER)

## 2023-07-27 ENCOUNTER — Ambulatory Visit: Payer: Self-pay | Admitting: Family Medicine

## 2023-07-27 ENCOUNTER — Encounter: Payer: Self-pay | Admitting: Family Medicine

## 2023-07-27 VITALS — BP 136/89 | HR 87 | Temp 97.2°F | Ht 73.0 in | Wt 198.2 lb

## 2023-07-27 DIAGNOSIS — Z1159 Encounter for screening for other viral diseases: Secondary | ICD-10-CM

## 2023-07-27 DIAGNOSIS — M542 Cervicalgia: Secondary | ICD-10-CM

## 2023-07-27 DIAGNOSIS — R011 Cardiac murmur, unspecified: Secondary | ICD-10-CM | POA: Diagnosis not present

## 2023-07-27 DIAGNOSIS — E785 Hyperlipidemia, unspecified: Secondary | ICD-10-CM

## 2023-07-27 DIAGNOSIS — R202 Paresthesia of skin: Secondary | ICD-10-CM | POA: Diagnosis not present

## 2023-07-27 DIAGNOSIS — R29898 Other symptoms and signs involving the musculoskeletal system: Secondary | ICD-10-CM

## 2023-07-27 DIAGNOSIS — Z8673 Personal history of transient ischemic attack (TIA), and cerebral infarction without residual deficits: Secondary | ICD-10-CM | POA: Diagnosis not present

## 2023-07-27 DIAGNOSIS — N898 Other specified noninflammatory disorders of vagina: Secondary | ICD-10-CM

## 2023-07-27 LAB — WET PREP FOR TRICH, YEAST, CLUE
Clue Cell Exam: NEGATIVE
Trichomonas Exam: NEGATIVE
Yeast Exam: NEGATIVE
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
## 2023-07-27 NOTE — Patient Instructions (Signed)
 Luvena   RePhresh

## 2023-07-28 ENCOUNTER — Ambulatory Visit (HOSPITAL_COMMUNITY)
Admission: RE | Admit: 2023-07-28 | Discharge: 2023-07-28 | Disposition: A | Discharge status: Home / Self Care | Source: Ambulatory Visit | Attending: Family Medicine | Admitting: Family Medicine

## 2023-07-28 DIAGNOSIS — R29898 Other symptoms and signs involving the musculoskeletal system: Secondary | ICD-10-CM | POA: Diagnosis present

## 2023-07-28 DIAGNOSIS — R202 Paresthesia of skin: Secondary | ICD-10-CM | POA: Insufficient documentation

## 2023-07-28 DIAGNOSIS — Z8673 Personal history of transient ischemic attack (TIA), and cerebral infarction without residual deficits: Secondary | ICD-10-CM | POA: Diagnosis present

## 2023-07-28 LAB — ANEMIA PROFILE B
Basophils Absolute: 0 x10E3/uL (ref 0.0–0.2)
Basos: 1 %
EOS (ABSOLUTE): 0.2 x10E3/uL (ref 0.0–0.4)
Eos: 4 %
Ferritin: 160 ng/mL — AB (ref 15–150)
Folate: 18.9 ng/mL (ref >3.0)
Hematocrit: 37.2 % (ref 34.0–46.6)
Hemoglobin: 12.3 g/dL (ref 11.1–15.9)
Immature Grans (Abs): 0 x10E3/uL (ref 0.0–0.1)
Immature Granulocytes: 0 %
Iron Saturation: 25 (ref 15–55)
Iron: 75 ug/dL (ref 27–159)
Lymphocytes Absolute: 1.5 x10E3/uL (ref 0.7–3.1)
Lymphs: 33 %
MCH: 30.4 pg (ref 26.6–33.0)
MCHC: 33.1 g/dL (ref 31.5–35.7)
MCV: 92 fL (ref 79–97)
Monocytes Absolute: 0.4 x10E3/uL (ref 0.1–0.9)
Monocytes: 9 %
Neutrophils Absolute: 2.4 x10E3/uL (ref 1.4–7.0)
Neutrophils: 53 %
Platelets: 229 x10E3/uL (ref 150–450)
RBC: 4.05 x10E6/uL (ref 3.77–5.28)
RDW: 14 % (ref 11.7–15.4)
Retic Ct Pct: 1.4 % (ref 0.6–2.6)
Total Iron Binding Capacity: 295 ug/dL (ref 250–450)
UIBC: 220 ug/dL (ref 131–425)
Vitamin B-12: 302 pg/mL (ref 232–1245)
WBC: 4.5 x10E3/uL (ref 3.4–10.8)

## 2023-07-28 LAB — CMP14+EGFR
ALT: 10 IU/L (ref 0–32)
AST: 17 IU/L (ref 0–40)
Albumin: 4.4 g/dL (ref 3.8–4.9)
Alkaline Phosphatase: 109 IU/L (ref 44–121)
BUN/Creatinine Ratio: 11 (ref 9–23)
BUN: 8 mg/dL (ref 6–24)
Bilirubin Total: 0.5 mg/dL (ref 0.0–1.2)
CO2: 20 mmol/L (ref 20–29)
Calcium: 9.4 mg/dL (ref 8.7–10.2)
Chloride: 104 mmol/L (ref 96–106)
Creatinine, Ser: 0.76 mg/dL (ref 0.57–1.00)
Globulin, Total: 2.8 g/dL (ref 1.5–4.5)
Glucose: 92 mg/dL (ref 70–99)
Potassium: 4.1 mmol/L (ref 3.5–5.2)
Sodium: 140 mmol/L (ref 134–144)
Total Protein: 7.2 g/dL (ref 6.0–8.5)
eGFR: 91 mL/min/1.73 (ref >59)

## 2023-07-28 LAB — LIPID PANEL
Chol/HDL Ratio: 2.4 ratio (ref 0.0–4.4)
Cholesterol, Total: 115 mg/dL (ref 100–199)
HDL: 47 mg/dL (ref >39)
LDL Chol Calc (NIH): 54 mg/dL (ref 0–99)
Triglycerides: 69 mg/dL (ref 0–149)
VLDL Cholesterol Cal: 14 mg/dL (ref 5–40)

## 2023-07-28 LAB — HEPATITIS B SURFACE ANTIBODY, QUANTITATIVE: Hepatitis B Surf Ab Quant: <3.5 m[IU]/mL — AB

## 2023-07-28 MED ORDER — GADOBUTROL 1 MMOL/ML IV SOLN
9.0000 mL | Freq: Once | INTRAVENOUS | Status: AC | PRN
  Administered 2023-07-28: 9 mL via INTRAVENOUS

## 2023-07-29 ENCOUNTER — Telehealth: Admitting: Physician Assistant

## 2023-07-29 ENCOUNTER — Encounter: Payer: Self-pay | Admitting: Family Medicine

## 2023-07-29 DIAGNOSIS — S99922A Unspecified injury of left foot, initial encounter: Secondary | ICD-10-CM

## 2023-07-29 NOTE — Patient Instructions (Signed)
 Alice [name], thank you for joining Delon [name], PA-C for today's virtual visit.  While this provider is not your primary care provider (PCP), if your PCP is located in our provider database this encounter information will be shared with them immediately following your visit.   A [facility] MyChart account gives you access to today's visit and all your visits, tests, and labs performed at [facility]  click here if you don't have a [facility] MyChart account or go to [email or web address]  Consent: (Patient) Alice [name] provided verbal consent for this virtual visit at the beginning of the encounter.  Current Medications:  Current Outpatient Medications:    aspirin  EC 81 MG tablet, Take 1 tablet (81 mg total) by mouth daily with breakfast. For Stroke Prevention, Disp: 30 tablet, Rfl: 11   atorvastatin  (LIPITOR) 40 MG tablet, Take 1 tablet (40 mg total) by mouth daily. **NEEDS TO BE SEEN BEFORE NEXT REFILL**, Disp: 30 tablet, Rfl: 0   baclofen  (LIORESAL ) 10 MG tablet, Take 1 tablet (10 mg total) by mouth 2 (two) times daily., Disp: 60 tablet, Rfl: 3   buprenorphine  (SUBUTEX ) 8 MG SUBL SL tablet, Place 4 mg under the tongue in the morning, at noon, in the evening, and at bedtime., Disp: , Rfl:    famotidine  (PEPCID ) 20 MG tablet, TAKE 1 TABLET BY MOUTH EVERY DAY AFTER SUPPER, Disp: 30 tablet, Rfl: 11   gabapentin  (NEURONTIN ) 100 MG capsule, Take 1 capsule (100 mg total) by mouth 4 (four) times daily. (Patient taking differently: Take 100 mg by mouth 2 (two) times daily.), Disp: 120 capsule, Rfl: 2   ibuprofen  (ADVIL ) 200 MG tablet, Take 200 mg by mouth every 6 (six) hours as needed for moderate pain (pain score 4-6) or headache., Disp: , Rfl:    ketoconazole  (NIZORAL ) 2 % cream, APPLY 1 APPLICATION TOPICALLY DAILY, Disp: 30 g, Rfl: 0   losartan  (COZAAR ) 25 MG tablet, TAKE 1 TABLET (25 MG TOTAL) BY MOUTH DAILY., Disp: 90 tablet, Rfl: 0   nitrofurantoin  (MACRODANTIN )  100 MG capsule, Take 1 capsule (100 mg total) by mouth daily., Disp: 30 capsule, Rfl: 3   pantoprazole  (PROTONIX ) 40 MG tablet, Take 1 tablet (40 mg total) by mouth daily. **NEEDS TO BE SEEN BEFORE NEXT REFILL**, Disp: 30 tablet, Rfl: 0   polyethylene glycol powder (GLYCOLAX /MIRALAX ) 17 GM/SCOOP powder, Take 17 g by mouth daily. (Patient taking differently: Take 17 g by mouth daily as needed for moderate constipation.), Disp: 3350 g, Rfl: 1   SUBOXONE  8-2 MG FILM, Place 0.5 Film under the tongue every 6 (six) hours., Disp: , Rfl:    Medications ordered in this encounter:  No orders of the defined types were placed in this encounter.    *If you need refills on other medications prior to your next appointment, please contact your pharmacy*  Follow-Up: Call back or seek an in-person evaluation if the symptoms worsen or if the condition fails to improve as anticipated.  [facility] [phone]  Other Instructions Contusion A contusion is a deep bruise. Contusions are the result of a blunt injury to tissues and muscle fibers under the skin. The injury causes bleeding under the skin. The skin over the contusion may turn blue, purple, or yellow. Minor injuries will give you a painless contusion, but more severe injuries cause contusions that can stay painful and swollen for a few weeks. Follow these instructions at home: Pay attention to any changes in your symptoms. Let  your health care provider know about them. Take these actions to relieve your pain. Managing pain, stiffness, and swelling  Use resting, icing, applying pressure (compression), and raising (elevating) the injured area. This is often called the RICE method. Rest the injured area. Return to your normal activities as told by your health care provider. Ask your health care provider what activities are safe for you. If directed, put ice on the injured area. To do this: Put ice in a plastic bag. Place a towel between  your skin and the bag. Leave the ice on for 20 minutes, 2-3 times a day. If your skin turns bright red, remove the ice right away to prevent skin damage. The risk of skin damage is higher if you cannot feel pain, heat, or cold. If directed, apply light compression to the injured area using an elastic bandage. Make sure the bandage is not wrapped too tightly. Remove and reapply the bandage as directed by your health care provider. If possible, elevate the injured area above the level of your heart while you are sitting or lying down. General instructions Take over-the-counter and prescription medicines only as told by your health care provider. Keep all follow-up visits. Your health care provider may want to see how your contusion is healing with treatment. Contact a health care provider if: Your symptoms do not improve after several days of treatment. Your symptoms get worse. You have difficulty moving the injured area. Get help right away if: You have severe pain. You have numbness in a hand or foot. Your hand or foot turns pale or cold. This information is not intended to replace advice given to you by your health care provider. Make sure you discuss any questions you have with your health care provider. Document Revised: 06/15/2021 Document Reviewed: 06/15/2021 Elsevier Patient Education  2024 Elsevier Inc.   If you have been instructed to have an in-person evaluation today at a local Urgent Care facility, please use the link below. It will take you to a list of all of our available [facility], including address, phone number and hours of operation. Please do not delay care.  [facility]  If you or a family member do not have a primary care provider, use the link below to schedule a visit and establish care. When you choose a [facility] primary care physician or advanced practice provider, you gain a long-term partner in health. Find a Primary Care  Provider  Learn more about [facility]'s in-office and virtual care options: [facility] - Get Care Now

## 2023-07-29 NOTE — Progress Notes (Signed)
 Virtual Visit Consent   Alice [name], you are scheduled for a virtual visit with a [facility] provider today. Just as with appointments in the office, your consent must be obtained to participate. Your consent will be active for this visit and any virtual visit you may have with one of our providers in the next 365 days. If you have a MyChart account, a copy of this consent can be sent to you electronically.  As this is a virtual visit, video technology does not allow for your provider to perform a traditional examination. This may limit your provider's ability to fully assess your condition. If your provider identifies any concerns that need to be evaluated in person or the need to arrange testing (such as labs, EKG, etc.), we will make arrangements to do so. Although advances in technology are sophisticated, we cannot ensure that it will always work on either your end or our end. If the connection with a video visit is poor, the visit may have to be switched to a telephone visit. With either a video or telephone visit, we are not always able to ensure that we have a secure connection.  By engaging in this virtual visit, you consent to the provision of healthcare and authorize for your insurance to be billed (if applicable) for the services provided during this visit. Depending on your insurance coverage, you may receive a charge related to this service.  I need to obtain your verbal consent now. Are you willing to proceed with your visit today? Alice [name] has provided verbal consent on 07/29/2023 for a virtual visit (video or telephone). Alice [name], PA-C  Date: 07/29/2023 11:34 AM   Virtual Visit via Video Note   I, Alice [name], connected with  Alice [name]  ([id number], 11-30-1965) on 07/29/23 at 11:15 AM EDT by a video-enabled telemedicine application and verified that I am speaking with the correct person using two identifiers.  Location: Patient: Virtual Visit Location  Patient: Home Provider: Virtual Visit Location Provider: Home Office   I discussed the limitations of evaluation and management by telemedicine and the availability of in person appointments. The patient expressed understanding and agreed to proceed.    History of Present Illness: Alice [name] is a 58 y.o. who identifies as a female who was assigned female at birth, and is being seen today for injury to left foot.  HPI: Foot Injury  The incident occurred 1 to 3 hours ago. The incident occurred at home. The injury mechanism was a direct blow (hit on refrigerator). The pain is present in the left foot. The quality of the pain is described as aching. The pain is moderate. The pain has been Constant since onset. Pertinent negatives include no inability to bear weight, loss of motion, loss of sensation, muscle weakness, numbness or tingling. Associated symptoms comments: Swelling noted over the 5th metatarsal head area but appears to be in the soft tissue only; see photos from initial injury attached. Now swelling and discoloration has started to dissipate into the surrounding soft tissue across the top of the foot and is not elevated like the photo. She reports no foreign bodies present. She has tried non-weight bearing, elevation and ice (hydrogen peroxide,) for the symptoms.     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 01/15/2022   GERD (gastroesophageal reflux disease) 07/24/2021   Depression, major, single episode, moderate (HCC) 10/22/2020    GAD (generalized anxiety disorder) 11/01/2018   Vitamin D  deficiency 11/01/2018   H/o Prior ischemic stroke (HCC) 09/03/2016   History of stroke 05/09/2016   History of substance abuse (HCC) 05/09/2016   Heart murmur 01/08/2016   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   Dyslipidemia 10/04/2015   HTN (hypertension) 10/04/2015   Discitis 10/04/2015   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/08/2015   Postlaminectomy syndrome, lumbar region 12/04/2012   Anemia 06/17/2012   Chronic low back pain 06/17/2012   DDD (degenerative disc disease), lumbosacral 06/17/2012   Sacroiliitis (HCC) 06/17/2012   Precordial pain 02/18/2010   Cigarette smoker 12/05/2009   Anxiety state 12/05/2009   Palpitations 12/05/2009    Allergies:  Allergies  Allergen Reactions   Benadryl [Diphenhydramine Hcl] Hives   Codeine Rash   Tetracycline Itching and Rash   Medications:  Current Outpatient Medications:    aspirin  EC 81 MG tablet, Take 1 tablet (81 mg total) by mouth daily with breakfast. For Stroke Prevention, Disp: 30 tablet, Rfl: 11   atorvastatin  (LIPITOR) 40 MG tablet, Take 1 tablet (40 mg total) by mouth daily. **NEEDS TO BE SEEN BEFORE NEXT REFILL**, Disp: 30 tablet, Rfl: 0   baclofen  (LIORESAL ) 10 MG tablet, Take 1 tablet (10 mg total) by mouth 2 (two) times daily., Disp: 60 tablet, Rfl: 3   buprenorphine  (SUBUTEX ) 8 MG SUBL SL tablet, Place 4 mg under the tongue in the morning, at noon, in the evening, and at bedtime., Disp: , Rfl:    famotidine  (PEPCID ) 20 MG tablet, TAKE 1 TABLET BY MOUTH EVERY DAY AFTER SUPPER, Disp: 30 tablet, Rfl: 11   gabapentin  (NEURONTIN ) 100 MG capsule, Take 1 capsule (100 mg total) by mouth 4 (four) times daily. (Patient taking differently: Take 100 mg by mouth 2 (two) times daily.), Disp: 120 capsule, Rfl: 2   ibuprofen  (ADVIL ) 200 MG tablet, Take 200 mg by mouth every 6 (six) hours as needed for moderate pain (pain score 4-6) or  headache., Disp: , Rfl:    ketoconazole  (NIZORAL ) 2 % cream, APPLY 1 APPLICATION TOPICALLY DAILY, Disp: 30 g, Rfl: 0   losartan  (COZAAR ) 25 MG tablet, TAKE 1 TABLET (25 MG TOTAL) BY MOUTH DAILY., Disp: 90 tablet, Rfl: 0   nitrofurantoin  (MACRODANTIN ) 100 MG capsule, Take 1 capsule (100 mg total) by mouth daily., Disp: 30 capsule, Rfl: 3   pantoprazole  (PROTONIX ) 40 MG tablet, Take 1 tablet (40 mg total) by mouth daily. **NEEDS TO BE SEEN BEFORE NEXT REFILL**, Disp: 30 tablet, Rfl: 0   polyethylene glycol powder (GLYCOLAX /MIRALAX ) 17 GM/SCOOP powder, Take 17 g by mouth daily. (Patient taking differently: Take 17 g by mouth daily as needed for moderate constipation.), Disp: 3350 g, Rfl: 1   SUBOXONE  8-2 MG FILM, Place 0.5 Film under the tongue every 6 (six) hours., Disp: , Rfl:   Observations/Objective: Patient is well-developed, well-nourished in no acute distress.  Resting comfortably at home.  Head is normocephalic, atraumatic.  No labored breathing.  Speech is clear and coherent with logical content.  Patient is alert and oriented at baseline.  Ecchymosis noted on the dorsal aspect of  the left foot over the 4th and 5th metatarsal heads now; Photos are from original injury      Assessment and Plan: 1. Foot injury, left, initial encounter (Primary)  - Suspect soft tissue injury and superficial vein rupture - Advised to rest, ice, use compression, and elevate - May use tylenol  - Can add Ibuprofen  in but advised to wait 12-24 hours before starting as this can increase soft tissue bleeding - Discussed expected evolution of the bruise with moving with gravity and color changes - Expectant course for about 2-3 weeks for healing - Seek in person evaluation if worsening or fails to improve in expected time  Follow Up Instructions: I discussed the assessment and treatment plan with the patient. The patient was provided an opportunity to ask questions and all were answered. The patient agreed  with the plan and demonstrated an understanding of the instructions.  A copy of instructions were sent to the patient via MyChart unless otherwise noted below.    The patient was advised to call back or seek an in-person evaluation if the symptoms worsen or if the condition fails to improve as anticipated.    Alice [name], PA-C

## 2023-07-31 ENCOUNTER — Other Ambulatory Visit: Payer: Self-pay | Admitting: Family Medicine

## 2023-07-31 DIAGNOSIS — K219 Gastro-esophageal reflux disease without esophagitis: Secondary | ICD-10-CM

## 2023-07-31 DIAGNOSIS — E782 Mixed hyperlipidemia: Secondary | ICD-10-CM

## 2023-08-14 ENCOUNTER — Other Ambulatory Visit: Payer: Self-pay | Admitting: Urology

## 2023-08-14 DIAGNOSIS — N390 Urinary tract infection, site not specified: Secondary | ICD-10-CM

## 2023-08-18 ENCOUNTER — Encounter: Payer: Self-pay | Admitting: Family Medicine

## 2023-08-23 ENCOUNTER — Encounter: Payer: Self-pay | Admitting: Family Medicine

## 2023-09-11 ENCOUNTER — Other Ambulatory Visit: Payer: Self-pay | Admitting: Family Medicine

## 2023-09-11 DIAGNOSIS — I10 Essential (primary) hypertension: Secondary | ICD-10-CM

## 2023-10-03 ENCOUNTER — Encounter: Payer: Self-pay | Admitting: Family Medicine

## 2023-10-05 ENCOUNTER — Encounter: Payer: Self-pay | Admitting: Family Medicine

## 2023-10-05 ENCOUNTER — Telehealth (INDEPENDENT_AMBULATORY_CARE_PROVIDER_SITE_OTHER): Admitting: Family Medicine

## 2023-10-05 DIAGNOSIS — R531 Weakness: Secondary | ICD-10-CM | POA: Diagnosis not present

## 2023-10-05 DIAGNOSIS — R5381 Other malaise: Secondary | ICD-10-CM

## 2023-10-05 DIAGNOSIS — R5383 Other fatigue: Secondary | ICD-10-CM

## 2023-10-05 NOTE — Progress Notes (Signed)
 Virtual Visit via Video   I connected with patient on 10/05/23 at 1505 by a video enabled telemedicine application and verified that I am speaking with the correct person using two identifiers.  Location patient: Home Location provider: [facility] Persons participating in the virtual visit: Patient and Provider  I discussed the limitations of evaluation and management by telemedicine and the availability of in person appointments. The patient expressed understanding and agreed to proceed.  Subjective:   HPI:  Pt presents today for  Chief Complaint  Patient presents with   Fatigue    Alice [name] is a 58 year old female who presents with persistent fatigue and lethargy.  She has been experiencing persistent fatigue and lethargy since Sunday, with symptoms worsening by Monday. She feels extremely tired and lethargic, with a sense of weakness that persists every morning, requiring her to push through the day.  No congestion, drainage, cough, diarrhea, night sweats, abnormal bleeding, or bruising. She has not been around anyone who is sick and has taken a COVID test, which returned negative results.  She mentions feeling depressed due to the persistent nature of her symptoms but does not report any specific symptoms of depression or anxiety.  There have been no changes in her medication regimen, and she continues to take vitamin D  daily.       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  01/15/2022   GERD (gastroesophageal reflux disease) 07/24/2021   Depression, major, single episode, moderate (HCC) 10/22/2020   GAD (generalized anxiety disorder) 11/01/2018   Vitamin D  deficiency 11/01/2018   H/o Prior ischemic stroke (HCC) 09/03/2016   History of stroke 05/09/2016   History of substance abuse (HCC) 05/09/2016   Heart murmur 01/08/2016   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   Dyslipidemia 10/04/2015   HTN (hypertension) 10/04/2015   Discitis 10/04/2015   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/08/2015   Postlaminectomy syndrome, lumbar region 12/04/2012   Anemia 06/17/2012   Chronic low back pain 06/17/2012   DDD (degenerative disc disease), lumbosacral 06/17/2012   Sacroiliitis 06/17/2012   Precordial pain 02/18/2010   Cigarette smoker 12/05/2009   Anxiety state 12/05/2009   Palpitations 12/05/2009    Social History   Tobacco Use   Smoking status: Every Day    Current packs/day: 0.50    Average packs/day: 0.5 packs/day for 40.0 years (20.0 ttl pk-yrs)    Types: Cigarettes    Passive exposure: Current   Smokeless tobacco: Never  Substance Use Topics   Alcohol use: Not Currently    Comment: today    Current Outpatient Medications:    aspirin  EC 81 MG tablet, Take 1 tablet (81 mg total) by mouth daily with breakfast. For Stroke Prevention, Disp: 30 tablet, Rfl: 11   atorvastatin  (LIPITOR) 40 MG tablet, Take 1 tablet (40 mg total) by mouth daily., Disp: 90 tablet, Rfl: 0   baclofen  (LIORESAL ) 10 MG tablet, Take 1 tablet (10 mg total) by mouth 2 (two) times daily., Disp: 60 tablet,  Rfl: 3   buprenorphine  (SUBUTEX ) 8 MG SUBL SL tablet, Place 4 mg under the tongue in the morning, at noon, in the evening, and at bedtime., Disp: , Rfl:    famotidine  (PEPCID ) 20 MG tablet, TAKE 1 TABLET BY MOUTH EVERY DAY AFTER SUPPER, Disp: 30 tablet, Rfl: 11   gabapentin  (NEURONTIN ) 100 MG capsule, Take 1 capsule (100 mg total) by  mouth 4 (four) times daily. (Patient taking differently: Take 100 mg by mouth 2 (two) times daily.), Disp: 120 capsule, Rfl: 2   ibuprofen  (ADVIL ) 200 MG tablet, Take 200 mg by mouth every 6 (six) hours as needed for moderate pain (pain score 4-6) or headache., Disp: , Rfl:    ketoconazole  (NIZORAL ) 2 % cream, APPLY 1 APPLICATION TOPICALLY DAILY, Disp: 30 g, Rfl: 0   losartan  (COZAAR ) 25 MG tablet, Take 1 tablet (25 mg total) by mouth daily. **NEEDS TO BE SEEN BEFORE NEXT REFILL**, Disp: 30 tablet, Rfl: 0   nitrofurantoin  (MACRODANTIN ) 100 MG capsule, Take 1 capsule (100 mg total) by mouth daily., Disp: 30 capsule, Rfl: 3   pantoprazole  (PROTONIX ) 40 MG tablet, Take 1 tablet (40 mg total) by mouth daily., Disp: 90 tablet, Rfl: 0   polyethylene glycol powder (GLYCOLAX /MIRALAX ) 17 GM/SCOOP powder, Take 17 g by mouth daily. (Patient taking differently: Take 17 g by mouth daily as needed for moderate constipation.), Disp: 3350 g, Rfl: 1   SUBOXONE  8-2 MG FILM, Place 0.5 Film under the tongue every 6 (six) hours., Disp: , Rfl:   Allergies  Allergen Reactions   Benadryl [Diphenhydramine Hcl] Hives   Codeine Rash   Tetracycline Itching and Rash    Objective:   LMP 07/30/2011   Patient is well-developed, well-nourished in no acute distress.  Resting comfortably at home.  Head is normocephalic, atraumatic.  No labored breathing.  Speech is clear and coherent with logical content.  Patient is alert and oriented at baseline.   Assessment and Plan:   Alice [name] was seen today for fatigue.  Diagnoses and all orders for this visit:  Malaise and fatigue -     Anemia Profile B; Future -     CMP14+EGFR; Future -     TSH; Future -     VITAMIN D  25 Hydroxy (Vit-D Deficiency, Fractures); Future -     T4, Free; Future  Generalized weakness -     Anemia Profile B; Future -     CMP14+EGFR; Future -     TSH; Future -     VITAMIN D  25 Hydroxy (Vit-D Deficiency, Fractures); Future -     T4, Free;  Future       Fatigue and weakness Fatigue and weakness since Sunday, worsening on Monday. No congestion, drainage, cough, diarrhea, abnormal bleeding, or bruising. Negative COVID test. Blood work will be ordered to check electrolytes, thyroid  function, potassium, calcium , vitamin D , B12, and complete blood count. - Order blood tests for electrolytes, thyroid  function, potassium, calcium , vitamin D , B12, and complete blood count. - Advise her to call the office to schedule lab appointment. - Instruct her to report any new or worsening symptoms.  Vitamin D  deficiency (suspected/under evaluation) Continues to take vitamin D  supplements. Vitamin D  levels to be checked as part of blood work to evaluate for deficiency. - Order blood test for vitamin D  level.  Depressive symptoms Reports feeling depressed due to persistent fatigue and weakness.        Return if symptoms worsen or fail to improve.  Alice Bruns, FNP-C  [facility] [address] [place], [place] [address] [phone]  10/05/2023  Time spent with the patient: 15 minutes, of which >50% was spent in obtaining information about symptoms, reviewing previous labs, evaluations, and treatments, counseling about condition (please see the discussed topics above), and developing a plan to further investigate it; had a number of questions which I addressed.

## 2023-10-25 ENCOUNTER — Other Ambulatory Visit: Payer: Self-pay | Admitting: Family Medicine

## 2023-10-25 DIAGNOSIS — L282 Other prurigo: Secondary | ICD-10-CM

## 2023-10-30 ENCOUNTER — Other Ambulatory Visit: Payer: Self-pay | Admitting: Family Medicine

## 2023-10-30 DIAGNOSIS — K219 Gastro-esophageal reflux disease without esophagitis: Secondary | ICD-10-CM

## 2023-10-30 DIAGNOSIS — E782 Mixed hyperlipidemia: Secondary | ICD-10-CM

## 2023-10-31 NOTE — Telephone Encounter (Signed)
 Appt scheduled for 01/24/2024

## 2023-10-31 NOTE — Telephone Encounter (Signed)
 Alice [name] NTBS in Jan for 6 mos FU RF sent to pharmacy

## 2023-11-03 ENCOUNTER — Other Ambulatory Visit

## 2023-11-03 DIAGNOSIS — R531 Weakness: Secondary | ICD-10-CM

## 2023-11-03 DIAGNOSIS — R5381 Other malaise: Secondary | ICD-10-CM

## 2023-11-04 ENCOUNTER — Telehealth: Admitting: Physician Assistant

## 2023-11-04 DIAGNOSIS — B9689 Other specified bacterial agents as the cause of diseases classified elsewhere: Secondary | ICD-10-CM

## 2023-11-04 DIAGNOSIS — J019 Acute sinusitis, unspecified: Secondary | ICD-10-CM

## 2023-11-04 LAB — ANEMIA PROFILE B
Basophils Absolute: 0 x10E3/uL (ref 0.0–0.2)
Basos: 1 %
EOS (ABSOLUTE): 0.2 x10E3/uL (ref 0.0–0.4)
Eos: 3 %
Ferritin: 160 ng/mL — ABNORMAL HIGH (ref 15–150)
Folate: 8.9 ng/mL (ref >3.0)
Hematocrit: 37.2 % (ref 34.0–46.6)
Hemoglobin: 12.1 g/dL (ref 11.1–15.9)
Immature Grans (Abs): 0 x10E3/uL (ref 0.0–0.1)
Immature Granulocytes: 0 %
Iron Saturation: 19 % (ref 15–55)
Iron: 50 ug/dL (ref 27–159)
Lymphocytes Absolute: 1.3 x10E3/uL (ref 0.7–3.1)
Lymphs: 24 %
MCH: 30.3 pg (ref 26.6–33.0)
MCHC: 32.5 g/dL (ref 31.5–35.7)
MCV: 93 fL (ref 79–97)
Monocytes Absolute: 0.6 x10E3/uL (ref 0.1–0.9)
Monocytes: 11 %
Neutrophils Absolute: 3.3 x10E3/uL (ref 1.4–7.0)
Neutrophils: 61 %
Platelets: 190 x10E3/uL (ref 150–450)
RBC: 3.99 x10E6/uL (ref 3.77–5.28)
RDW: 12.1 % (ref 11.7–15.4)
Retic Ct Pct: 1.5 % (ref 0.6–2.6)
Total Iron Binding Capacity: 263 ug/dL (ref 250–450)
UIBC: 213 ug/dL (ref 131–425)
Vitamin B-12: 314 pg/mL (ref 232–1245)
WBC: 5.4 x10E3/uL (ref 3.4–10.8)

## 2023-11-04 LAB — CMP14+EGFR
ALT: 40 IU/L — ABNORMAL HIGH (ref 0–32)
AST: 36 IU/L (ref 0–40)
Albumin: 4.2 g/dL (ref 3.8–4.9)
Alkaline Phosphatase: 198 IU/L — ABNORMAL HIGH (ref 49–135)
BUN/Creatinine Ratio: 14 (ref 9–23)
BUN: 10 mg/dL (ref 6–24)
Bilirubin Total: 0.7 mg/dL (ref 0.0–1.2)
CO2: 25 mmol/L (ref 20–29)
Calcium: 9.4 mg/dL (ref 8.7–10.2)
Chloride: 102 mmol/L (ref 96–106)
Creatinine, Ser: 0.7 mg/dL (ref 0.57–1.00)
Globulin, Total: 2.8 g/dL (ref 1.5–4.5)
Glucose: 92 mg/dL (ref 70–99)
Potassium: 4.7 mmol/L (ref 3.5–5.2)
Sodium: 140 mmol/L (ref 134–144)
Total Protein: 7 g/dL (ref 6.0–8.5)
eGFR: 100 mL/min/1.73 (ref >59)

## 2023-11-04 LAB — VITAMIN D 25 HYDROXY (VIT D DEFICIENCY, FRACTURES): Vit D, 25-Hydroxy: 30.1 ng/mL (ref 30.0–100.0)

## 2023-11-04 LAB — T4, FREE: Free T4: 1.21 ng/dL (ref 0.82–1.77)

## 2023-11-04 LAB — TSH: TSH: 2.88 u[IU]/mL (ref 0.450–4.500)

## 2023-11-04 MED ORDER — FLUTICASONE PROPIONATE 50 MCG/ACT NA SUSP: 2.0000 | Freq: Every day | NASAL | 0 refills | Status: DC

## 2023-11-04 MED ORDER — AMOXICILLIN-POT CLAVULANATE 875-125 MG PO TABS: 1.0000 | ORAL_TABLET | Freq: Two times a day (BID) | ORAL | 0 refills | Status: DC

## 2023-11-04 NOTE — Progress Notes (Signed)
 Virtual Visit Consent   Alice [name], you are scheduled for a virtual visit with a [facility] provider today. Just as with appointments in the office, your consent must be obtained to participate. Your consent will be active for this visit and any virtual visit you may have with one of our providers in the next 365 days. If you have a MyChart account, a copy of this consent can be sent to you electronically.  As this is a virtual visit, video technology does not allow for your provider to perform a traditional examination. This may limit your provider's ability to fully assess your condition. If your provider identifies any concerns that need to be evaluated in person or the need to arrange testing (such as labs, EKG, etc.), we will make arrangements to do so. Although advances in technology are sophisticated, we cannot ensure that it will always work on either your end or our end. If the connection with a video visit is poor, the visit may have to be switched to a telephone visit. With either a video or telephone visit, we are not always able to ensure that we have a secure connection.  By engaging in this virtual visit, you consent to the provision of healthcare and authorize for your insurance to be billed (if applicable) for the services provided during this visit. Depending on your insurance coverage, you may receive a charge related to this service.  I need to obtain your verbal consent now. Are you willing to proceed with your visit today? TAJAE MAIOLO has provided verbal consent on 11/04/2023 for a virtual visit (video or telephone). Delon [name], PA-C  Date: 11/04/2023 3:12 PM   Virtual Visit via Video Note   I, Delon [name], connected with  Alice [name]  ([id number], [birth year]) on 11/04/23 at  2:45 PM EDT by a video-enabled telemedicine application and verified that I am speaking with the correct person using two identifiers.  Location: Patient: Virtual Visit Location  Patient: Home Provider: Virtual Visit Location Provider: Home Office   I discussed the limitations of evaluation and management by telemedicine and the availability of in person appointments. The patient expressed understanding and agreed to proceed.    History of Present Illness: Alice [name] is a 58 y.o. who identifies as a female who was assigned female at birth, and is being seen today for sinus congestion.  HPI: URI  This is a new problem. The current episode started in the past 7 days (3 days). The problem has been gradually worsening. There has been no fever. Associated symptoms include congestion, headaches, rhinorrhea and sinus pain. Pertinent negatives include no chest pain, coughing, diarrhea, ear pain, nausea, plugged ear sensation, sore throat or vomiting. Associated symptoms comments: Fatigue. She has tried NSAIDs (ibuprofen ) for the symptoms. The treatment provided no relief.  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 01/15/2022   GERD (gastroesophageal reflux disease) 07/24/2021   Depression, major, single episode, moderate (HCC) 10/22/2020   GAD (generalized anxiety disorder)  11/01/2018   Vitamin D  deficiency 11/01/2018   H/o Prior ischemic stroke (HCC) 09/03/2016   History of stroke 05/09/2016   History of substance abuse (HCC) 05/09/2016   Heart murmur 01/08/2016   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   Dyslipidemia 10/04/2015   HTN (hypertension) 10/04/2015    Discitis 10/04/2015   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/08/2015   Postlaminectomy syndrome, lumbar region 12/04/2012   Anemia 06/17/2012   Chronic low back pain 06/17/2012   DDD (degenerative disc disease), lumbosacral 06/17/2012   Sacroiliitis 06/17/2012   Precordial pain 02/18/2010   Cigarette smoker 12/05/2009   Anxiety state 12/05/2009   Palpitations 12/05/2009    Allergies:  Allergies  Allergen Reactions   Benadryl [Diphenhydramine Hcl] Hives   Codeine Rash   Tetracycline Itching and Rash   Medications:  Current Outpatient Medications:    amoxicillin -clavulanate (AUGMENTIN ) 875-125 MG tablet, Take 1 tablet by mouth 2 (two) times daily., Disp: 14 tablet, Rfl: 0   fluticasone  (FLONASE ) 50 MCG/ACT nasal spray, Place 2 sprays into both nostrils daily., Disp: 16 g, Rfl: 0   aspirin  EC 81 MG tablet, Take 1 tablet (81 mg total) by mouth daily with breakfast. For Stroke Prevention, Disp: 30 tablet, Rfl: 11   atorvastatin  (LIPITOR) 40 MG tablet, TAKE 1 TABLET BY MOUTH EVERY DAY, Disp: 90 tablet, Rfl: 0   baclofen  (LIORESAL ) 10 MG tablet, Take 1 tablet (10 mg total) by mouth 2 (two) times daily., Disp: 60 tablet, Rfl: 3   buprenorphine  (SUBUTEX ) 8 MG SUBL SL tablet, Place 4 mg under the tongue in the morning, at noon, in the evening, and at bedtime., Disp: , Rfl:    famotidine  (PEPCID ) 20 MG tablet, TAKE 1 TABLET BY MOUTH EVERY DAY AFTER SUPPER, Disp: 30 tablet, Rfl: 11   gabapentin  (NEURONTIN ) 100 MG capsule, Take 1 capsule (100 mg total) by mouth 4 (four) times daily. (Patient taking differently: Take 100 mg by mouth 2 (two) times daily.), Disp: 120 capsule, Rfl: 2   ibuprofen  (ADVIL ) 200 MG tablet, Take 200 mg by mouth every 6 (six) hours as needed for moderate pain (pain score 4-6) or headache., Disp: , Rfl:    ketoconazole  (NIZORAL ) 2 % cream, APPLY 1 APPLICATION TOPICALLY DAILY, Disp: 30 g, Rfl: 0   losartan  (COZAAR ) 25 MG tablet, Take 1 tablet (25  mg total) by mouth daily. **NEEDS TO BE SEEN BEFORE NEXT REFILL**, Disp: 30 tablet, Rfl: 0   nitrofurantoin  (MACRODANTIN ) 100 MG capsule, Take 1 capsule (100 mg total) by mouth daily., Disp: 30 capsule, Rfl: 3   pantoprazole  (PROTONIX ) 40 MG tablet, TAKE 1 TABLET BY MOUTH EVERY DAY, Disp: 90 tablet, Rfl: 0   polyethylene glycol powder (GLYCOLAX /MIRALAX ) 17 GM/SCOOP powder, Take 17 g by mouth daily. (Patient taking differently: Take 17 g by mouth daily as needed for moderate constipation.), Disp: 3350 g, Rfl: 1   SUBOXONE  8-2 MG FILM, Place 0.5 Film under the tongue every 6 (six) hours., Disp: , Rfl:   Observations/Objective: Patient is well-developed, well-nourished in no acute distress.  Resting comfortably at home.  Head is normocephalic, atraumatic.  No labored breathing.  Speech is clear and coherent with logical content.  Patient is alert and oriented at baseline.    Assessment and Plan: 1. Acute bacterial sinusitis (Primary) - amoxicillin -clavulanate (AUGMENTIN ) 875-125 MG tablet; Take 1 tablet by mouth 2 (two) times daily.  Dispense: 14 tablet; Refill: 0 - fluticasone  (FLONASE ) 50 MCG/ACT nasal spray; Place 2 sprays  into both nostrils daily.  Dispense: 16 g; Refill: 0  - Worsening symptoms that have not responded to OTC medications.  - Will give Augmentin  and Flonase  - Continue allergy medications.  - Steam and humidifier can help - Stay well hydrated and get plenty of rest.  - Seek in person evaluation if no symptom improvement or if symptoms worsen   Follow Up Instructions: I discussed the assessment and treatment plan with the patient. The patient was provided an opportunity to ask questions and all were answered. The patient agreed with the plan and demonstrated an understanding of the instructions.  A copy of instructions were sent to the patient via MyChart unless otherwise noted below.    The patient was advised to call back or seek an in-person evaluation if the  symptoms worsen or if the condition fails to improve as anticipated.    Delon [name], PA-C

## 2023-11-04 NOTE — Patient Instructions (Signed)
 Donia KANDICE Lesches, thank you for joining Delon [name], PA-C for today's virtual visit.  While this provider is not your primary care provider (PCP), if your PCP is located in our provider database this encounter information will be shared with them immediately following your visit.   A [facility] MyChart account gives you access to today's visit and all your visits, tests, and labs performed at [facility]  click here if you don't have a [facility] MyChart account or go to [email or web address]  Consent: (Patient) Donia KANDICE Lesches provided verbal consent for this virtual visit at the beginning of the encounter.  Current Medications:  Current Outpatient Medications:    amoxicillin -clavulanate (AUGMENTIN ) 875-125 MG tablet, Take 1 tablet by mouth 2 (two) times daily., Disp: 14 tablet, Rfl: 0   fluticasone  (FLONASE ) 50 MCG/ACT nasal spray, Place 2 sprays into both nostrils daily., Disp: 16 g, Rfl: 0   aspirin  EC 81 MG tablet, Take 1 tablet (81 mg total) by mouth daily with breakfast. For Stroke Prevention, Disp: 30 tablet, Rfl: 11   atorvastatin  (LIPITOR) 40 MG tablet, TAKE 1 TABLET BY MOUTH EVERY DAY, Disp: 90 tablet, Rfl: 0   baclofen  (LIORESAL ) 10 MG tablet, Take 1 tablet (10 mg total) by mouth 2 (two) times daily., Disp: 60 tablet, Rfl: 3   buprenorphine  (SUBUTEX ) 8 MG SUBL SL tablet, Place 4 mg under the tongue in the morning, at noon, in the evening, and at bedtime., Disp: , Rfl:    famotidine  (PEPCID ) 20 MG tablet, TAKE 1 TABLET BY MOUTH EVERY DAY AFTER SUPPER, Disp: 30 tablet, Rfl: 11   gabapentin  (NEURONTIN ) 100 MG capsule, Take 1 capsule (100 mg total) by mouth 4 (four) times daily. (Patient taking differently: Take 100 mg by mouth 2 (two) times daily.), Disp: 120 capsule, Rfl: 2   ibuprofen  (ADVIL ) 200 MG tablet, Take 200 mg by mouth every 6 (six) hours as needed for moderate pain (pain score 4-6) or headache., Disp: , Rfl:    ketoconazole  (NIZORAL ) 2 % cream, APPLY 1  APPLICATION TOPICALLY DAILY, Disp: 30 g, Rfl: 0   losartan  (COZAAR ) 25 MG tablet, Take 1 tablet (25 mg total) by mouth daily. **NEEDS TO BE SEEN BEFORE NEXT REFILL**, Disp: 30 tablet, Rfl: 0   nitrofurantoin  (MACRODANTIN ) 100 MG capsule, Take 1 capsule (100 mg total) by mouth daily., Disp: 30 capsule, Rfl: 3   pantoprazole  (PROTONIX ) 40 MG tablet, TAKE 1 TABLET BY MOUTH EVERY DAY, Disp: 90 tablet, Rfl: 0   polyethylene glycol powder (GLYCOLAX /MIRALAX ) 17 GM/SCOOP powder, Take 17 g by mouth daily. (Patient taking differently: Take 17 g by mouth daily as needed for moderate constipation.), Disp: 3350 g, Rfl: 1   SUBOXONE  8-2 MG FILM, Place 0.5 Film under the tongue every 6 (six) hours., Disp: , Rfl:    Medications ordered in this encounter:  Meds ordered this encounter  Medications   amoxicillin -clavulanate (AUGMENTIN ) 875-125 MG tablet    Sig: Take 1 tablet by mouth 2 (two) times daily.    Dispense:  14 tablet    Refill:  0    Supervising Provider:   LAMPTEY, PHILIP O [[id number]]   fluticasone  (FLONASE ) 50 MCG/ACT nasal spray    Sig: Place 2 sprays into both nostrils daily.    Dispense:  16 g    Refill:  0    Supervising Provider:   BLAISE ALEENE KIDD [id number]     *If you need refills on other medications prior to your next appointment, please contact  your pharmacy*  Follow-Up: Call back or seek an in-person evaluation if the symptoms worsen or if the condition fails to improve as anticipated.  [facility] [phone]  Other Instructions Sinus Infection, Adult A sinus infection, also called sinusitis, is inflammation of your sinuses. Sinuses are hollow spaces in the bones around your face. Your sinuses are located: Around your eyes. In the middle of your forehead. Behind your nose. In your cheekbones. Mucus normally drains out of your sinuses. When your nasal tissues become inflamed or swollen, mucus can become trapped or blocked. This allows bacteria, viruses, and  fungi to grow, which leads to infection. Most infections of the sinuses are caused by a virus. A sinus infection can develop quickly. It can last for up to 4 weeks (acute) or for more than 12 weeks (chronic). A sinus infection often develops after a cold. What are the causes? This condition is caused by anything that creates swelling in the sinuses or stops mucus from draining. This includes: Allergies. Asthma. Infection from bacteria or viruses. Deformities or blockages in your nose or sinuses. Abnormal growths in the nose (nasal polyps). Pollutants, such as chemicals or irritants in the air. Infection from fungi. This is rare. What increases the risk? You are more likely to develop this condition if you: Have a weak body defense system (immune system). Do a lot of swimming or diving. Overuse nasal sprays. Smoke. What are the signs or symptoms? The main symptoms of this condition are pain and a feeling of pressure around the affected sinuses. Other symptoms include: Stuffy nose or congestion that makes it difficult to breathe through your nose. Thick yellow or greenish drainage from your nose. Tenderness, swelling, and warmth over the affected sinuses. A cough that may get worse at night. Decreased sense of smell and taste. Extra mucus that collects in the throat or the back of the nose (postnasal drip) causing a sore throat or bad breath. Tiredness (fatigue). Fever. How is this diagnosed? This condition is diagnosed based on: Your symptoms. Your medical history. A physical exam. Tests to find out if your condition is acute or chronic. This may include: Checking your nose for nasal polyps. Viewing your sinuses using a device that has a light (endoscope). Testing for allergies or bacteria. Imaging tests, such as an MRI or CT scan. In rare cases, a bone biopsy may be done to rule out more serious types of fungal sinus disease. How is this treated? Treatment for a sinus infection  depends on the cause and whether your condition is chronic or acute. If caused by a virus, your symptoms should go away on their own within 10 days. You may be given medicines to relieve symptoms. They include: Medicines that shrink swollen nasal passages (decongestants). A spray that eases inflammation of the nostrils (topical intranasal corticosteroids). Rinses that help get rid of thick mucus in your nose (nasal saline washes). Medicines that treat allergies (antihistamines). Over-the-counter pain relievers. If caused by bacteria, your health care provider may recommend waiting to see if your symptoms improve. Most bacterial infections will get better without antibiotic medicine. You may be given antibiotics if you have: A severe infection. A weak immune system. If caused by narrow nasal passages or nasal polyps, surgery may be needed. Follow these instructions at home: Medicines Take, use, or apply over-the-counter and prescription medicines only as told by your health care provider. These may include nasal sprays. If you were prescribed an antibiotic medicine, take it as told  by your health care provider. Do not stop taking the antibiotic even if you start to feel better. Hydrate and humidify  Drink enough fluid to keep your urine pale yellow. Staying hydrated will help to thin your mucus. Use a cool mist humidifier to keep the humidity level in your home above 50%. Inhale steam for 10-15 minutes, 3-4 times a day, or as told by your health care provider. You can do this in the bathroom while a hot shower is running. Limit your exposure to cool or dry air. Rest Rest as much as possible. Sleep with your head raised (elevated). Make sure you get enough sleep each night. General instructions  Apply a warm, moist washcloth to your face 3-4 times a day or as told by your health care provider. This will help with discomfort. Use nasal saline washes as often as told by your health care  provider. Wash your hands often with soap and water  to reduce your exposure to germs. If soap and water  are not available, use hand sanitizer. Do not smoke. Avoid being around people who are smoking (secondhand smoke). Keep all follow-up visits. This is important. Contact a health care provider if: You have a fever. Your symptoms get worse. Your symptoms do not improve within 10 days. Get help right away if: You have a severe headache. You have persistent vomiting. You have severe pain or swelling around your face or eyes. You have vision problems. You develop confusion. Your neck is stiff. You have trouble breathing. These symptoms may be an emergency. Get help right away. Call 911. Do not wait to see if the symptoms will go away. Do not drive yourself to the hospital. Summary A sinus infection is soreness and inflammation of your sinuses. Sinuses are hollow spaces in the bones around your face. This condition is caused by nasal tissues that become inflamed or swollen. The swelling traps or blocks the flow of mucus. This allows bacteria, viruses, and fungi to grow, which leads to infection. If you were prescribed an antibiotic medicine, take it as told by your health care provider. Do not stop taking the antibiotic even if you start to feel better. Keep all follow-up visits. This is important. This information is not intended to replace advice given to you by your health care provider. Make sure you discuss any questions you have with your health care provider. Document Revised: 12/02/2020 Document Reviewed: 12/02/2020 Elsevier Patient Education  2024 Elsevier Inc.   If you have been instructed to have an in-person evaluation today at a local Urgent Care facility, please use the link below. It will take you to a list of all of our available [facility], including address, phone number and hours of operation. Please do not delay care.  [facility]  If you or  a family member do not have a primary care provider, use the link below to schedule a visit and establish care. When you choose a [facility] primary care physician or advanced practice provider, you gain a long-term partner in health. Find a Primary Care Provider  Learn more about [facility] in-office and virtual care options: [facility] - Get Care Now

## 2023-11-06 ENCOUNTER — Telehealth: Admitting: Nurse Practitioner

## 2023-11-06 DIAGNOSIS — J440 Chronic obstructive pulmonary disease with acute lower respiratory infection: Secondary | ICD-10-CM

## 2023-11-06 DIAGNOSIS — J209 Acute bronchitis, unspecified: Secondary | ICD-10-CM

## 2023-11-06 MED ORDER — ALBUTEROL SULFATE (2.5 MG/3ML) 0.083% IN NEBU: 2.5000 mg | INHALATION_SOLUTION | Freq: Four times a day (QID) | RESPIRATORY_TRACT | 1 refills | Status: AC | PRN

## 2023-11-06 MED ORDER — AZITHROMYCIN 250 MG PO TABS: ORAL_TABLET | ORAL | 0 refills | Status: AC

## 2023-11-06 MED ORDER — PREDNISONE 10 MG PO TABS: ORAL_TABLET | ORAL | 0 refills | Status: DC

## 2023-11-06 NOTE — Progress Notes (Signed)
 Virtual Visit Consent   Alice [name], you are scheduled for a virtual visit with a [facility] provider today. Just as with appointments in the office, your consent must be obtained to participate. Your consent will be active for this visit and any virtual visit you may have with one of our providers in the next 365 days. If you have a MyChart account, a copy of this consent can be sent to you electronically.  As this is a virtual visit, video technology does not allow for your provider to perform a traditional examination. This may limit your provider's ability to fully assess your condition. If your provider identifies any concerns that need to be evaluated in person or the need to arrange testing (such as labs, EKG, etc.), we will make arrangements to do so. Although advances in technology are sophisticated, we cannot ensure that it will always work on either your end or our end. If the connection with a video visit is poor, the visit may have to be switched to a telephone visit. With either a video or telephone visit, we are not always able to ensure that we have a secure connection.  By engaging in this virtual visit, you consent to the provision of healthcare and authorize for your insurance to be billed (if applicable) for the services provided during this visit. Depending on your insurance coverage, you may receive a charge related to this service.  I need to obtain your verbal consent now. Are you willing to proceed with your visit today? Alice [name] has provided verbal consent on 11/06/2023 for a virtual visit (video or telephone). Alice LELON Servant, NP  Date: 11/06/2023 8:58 AM   Virtual Visit via Video Note   I, Alice [name], connected with  Alice [name]  ([id number], 1965/06/23) on 11/06/23 at  8:45 AM EDT by a video-enabled telemedicine application and verified that I am speaking with the correct person using two identifiers.  Location: Patient: Virtual Visit Location Patient:  Home Provider: Virtual Visit Location Provider: Home Office   I discussed the limitations of evaluation and management by telemedicine and the availability of in person appointments. The patient expressed understanding and agreed to proceed.    History of Present Illness: Alice [name] is a 58 y.o. who identifies as a female who was assigned female at birth, and is being seen today for cough and congestion.  [name] who is an active smoker is currently experiencing severe cough, congestion, shortness of breath with wheezing.  She was recently prescribed Augmentin  for bacterial sinusitis but feels this is more of a respiratory issue she is experiencing.  States her chest is tight and she is unable to produce any sputum with coughing.  She does continue to smoke however she has not been able to smoke due to her current symptoms.  She is not using any nebulizers and states she was told to stop all of her COPD medications as her COPD number was 0  She denies fevers states COVID test have been negative x 3  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 01/15/2022   GERD (gastroesophageal reflux disease) 07/24/2021   Depression, major, single episode, moderate (HCC) 10/22/2020   GAD (generalized anxiety disorder) 11/01/2018   Vitamin D  deficiency 11/01/2018   H/o Prior ischemic stroke (HCC) 09/03/2016   History of stroke 05/09/2016   History of substance abuse (HCC) 05/09/2016   Heart murmur 01/08/2016   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/04/2015   Dyslipidemia 10/04/2015   HTN (hypertension) 10/04/2015   Discitis 10/04/2015   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/08/2015   Postlaminectomy syndrome, lumbar region 12/04/2012   Anemia 06/17/2012   Chronic low back pain 06/17/2012   DDD (degenerative disc disease), lumbosacral 06/17/2012   Sacroiliitis 06/17/2012   Precordial pain 02/18/2010   Cigarette smoker 12/05/2009   Anxiety state 12/05/2009   Palpitations 12/05/2009    Allergies:  Allergies  Allergen Reactions   Benadryl [Diphenhydramine Hcl] Hives   Codeine Rash   Tetracycline Itching and Rash   Medications:  Current Outpatient Medications:    albuterol  (PROVENTIL ) (2.5 MG/3ML) 0.083% nebulizer solution, Take 3 mLs (2.5 mg total) by nebulization every 6 (six) hours as needed for wheezing or shortness of breath., Disp: 150 mL, Rfl: 1   aspirin  EC 81 MG tablet, Take 1 tablet (81 mg total) by mouth daily with breakfast. For Stroke Prevention, Disp: 30 tablet, Rfl: 11   atorvastatin  (LIPITOR) 40 MG tablet, TAKE 1 TABLET BY MOUTH EVERY DAY, Disp: 90 tablet, Rfl: 0   azithromycin  (ZITHROMAX ) 250 MG tablet, Take 2 tablets on day 1, then 1 tablet daily on days 2 through 5, Disp: 6 tablet, Rfl: 0   baclofen  (LIORESAL ) 10 MG tablet, Take 1 tablet (10 mg total) by mouth 2 (two) times daily., Disp: 60 tablet, Rfl: 3   buprenorphine  (SUBUTEX ) 8 MG SUBL SL tablet, Place 4 mg under the tongue in the morning, at noon, in the evening, and at bedtime., Disp: , Rfl:    famotidine  (PEPCID ) 20 MG tablet, TAKE 1 TABLET BY MOUTH EVERY DAY AFTER SUPPER, Disp: 30 tablet, Rfl: 11   fluticasone  (FLONASE ) 50 MCG/ACT nasal spray, Place 2 sprays into both nostrils daily., Disp: 16 g, Rfl: 0   gabapentin  (NEURONTIN ) 100 MG capsule, Take 1 capsule (100 mg total) by mouth 4 (four) times daily. (Patient taking differently: Take 100 mg by mouth 2 (two) times daily.), Disp: 120 capsule, Rfl: 2   ibuprofen  (ADVIL )  200 MG tablet, Take 200 mg by mouth every 6 (six) hours as needed for moderate pain (pain score 4-6) or headache., Disp: , Rfl:    ketoconazole  (NIZORAL ) 2 % cream, APPLY 1 APPLICATION TOPICALLY DAILY, Disp: 30 g, Rfl: 0   losartan  (COZAAR ) 25 MG tablet, Take 1 tablet (25 mg total) by mouth daily. **NEEDS TO BE SEEN BEFORE NEXT REFILL**, Disp: 30 tablet, Rfl: 0   pantoprazole  (PROTONIX ) 40 MG tablet, TAKE 1 TABLET BY MOUTH EVERY DAY, Disp: 90 tablet, Rfl: 0   polyethylene glycol powder (GLYCOLAX /MIRALAX ) 17 GM/SCOOP powder, Take 17 g by mouth daily. (Patient taking differently: Take 17 g by mouth daily as needed for moderate constipation.), Disp: 3350 g, Rfl: 1   predniSONE  (DELTASONE ) 10 MG tablet, Directions for 6 day taper: Day 1: 2 tablets before breakfast, 1 after both lunch & dinner and 2 at bedtime Day 2: 1 tab before breakfast, 1 after both lunch & dinner and 2 at bedtime Day 3: 1 tab at each meal & 1 at bedtime Day 4:  1 tab at breakfast, 1 at lunch, 1 at bedtime Day 5: 1 tab at breakfast & 1 tab at bedtime Day 6: 1 tab at breakfast, Disp: 21 tablet, Rfl: 0   SUBOXONE  8-2 MG FILM, Place 0.5 Film under the tongue every 6 (six) hours., Disp: , Rfl:   Observations/Objective: Patient is well-developed, well-nourished in no acute distress.  Resting comfortably at home.  Head is normocephalic, atraumatic.  Labored breathing. Speech is clear and coherent with logical content.  Patient is alert and oriented at baseline.    Assessment and Plan: 1. Acute bronchitis with COPD (HCC) (Primary) - predniSONE  (DELTASONE ) 10 MG tablet; Directions for 6 day taper: Day 1: 2 tablets before breakfast, 1 after both lunch & dinner and 2 at bedtime Day 2: 1 tab before breakfast, 1 after both lunch & dinner and 2 at bedtime Day 3: 1 tab at each meal & 1 at bedtime Day 4: 1 tab at breakfast, 1 at lunch, 1 at bedtime Day 5: 1 tab at breakfast & 1 tab at bedtime Day 6: 1 tab at breakfast  Dispense: 21 tablet; Refill:  0 - azithromycin  (ZITHROMAX ) 250 MG tablet; Take 2 tablets on day 1, then 1 tablet daily on days 2 through 5  Dispense: 6 tablet; Refill: 0 - albuterol  (PROVENTIL ) (2.5 MG/3ML) 0.083% nebulizer solution; Take 3 mLs (2.5 mg total) by nebulization every 6 (six) hours as needed for wheezing or shortness of breath.  Dispense: 150 mL; Refill: 1   Follow Up Instructions: I discussed the assessment and treatment plan with the patient. The patient was provided an opportunity to ask questions and all were answered. The patient agreed with the plan and demonstrated an understanding of the instructions.  A copy of instructions were sent to the patient via MyChart unless otherwise noted below.    The patient was advised to call back or seek an in-person evaluation if the symptoms worsen or if the condition fails to improve as anticipated.    Genessis Flanary W Norris Bodley, NP

## 2023-11-06 NOTE — Patient Instructions (Signed)
 Alice [name], thank you for joining Haze LELON Servant, NP for today's virtual visit.  While this provider is not your primary care provider (PCP), if your PCP is located in our provider database this encounter information will be shared with them immediately following your visit.   A [facility] MyChart account gives you access to today's visit and all your visits, tests, and labs performed at [facility]  click here if you don't have a [facility] MyChart account or go to [email or web address]  Consent: (Patient) Alice [name] provided verbal consent for this virtual visit at the beginning of the encounter.  Current Medications:  Current Outpatient Medications:    albuterol  (PROVENTIL ) (2.5 MG/3ML) 0.083% nebulizer solution, Take 3 mLs (2.5 mg total) by nebulization every 6 (six) hours as needed for wheezing or shortness of breath., Disp: 150 mL, Rfl: 1   aspirin  EC 81 MG tablet, Take 1 tablet (81 mg total) by mouth daily with breakfast. For Stroke Prevention, Disp: 30 tablet, Rfl: 11   atorvastatin  (LIPITOR) 40 MG tablet, TAKE 1 TABLET BY MOUTH EVERY DAY, Disp: 90 tablet, Rfl: 0   azithromycin  (ZITHROMAX ) 250 MG tablet, Take 2 tablets on day 1, then 1 tablet daily on days 2 through 5, Disp: 6 tablet, Rfl: 0   baclofen  (LIORESAL ) 10 MG tablet, Take 1 tablet (10 mg total) by mouth 2 (two) times daily., Disp: 60 tablet, Rfl: 3   buprenorphine  (SUBUTEX ) 8 MG SUBL SL tablet, Place 4 mg under the tongue in the morning, at noon, in the evening, and at bedtime., Disp: , Rfl:    famotidine  (PEPCID ) 20 MG tablet, TAKE 1 TABLET BY MOUTH EVERY DAY AFTER SUPPER, Disp: 30 tablet, Rfl: 11   fluticasone  (FLONASE ) 50 MCG/ACT nasal spray, Place 2 sprays into both nostrils daily., Disp: 16 g, Rfl: 0   gabapentin  (NEURONTIN ) 100 MG capsule, Take 1 capsule (100 mg total) by mouth 4 (four) times daily. (Patient taking differently: Take 100 mg by mouth 2 (two) times daily.), Disp: 120 capsule, Rfl:  2   ibuprofen  (ADVIL ) 200 MG tablet, Take 200 mg by mouth every 6 (six) hours as needed for moderate pain (pain score 4-6) or headache., Disp: , Rfl:    ketoconazole  (NIZORAL ) 2 % cream, APPLY 1 APPLICATION TOPICALLY DAILY, Disp: 30 g, Rfl: 0   losartan  (COZAAR ) 25 MG tablet, Take 1 tablet (25 mg total) by mouth daily. **NEEDS TO BE SEEN BEFORE NEXT REFILL**, Disp: 30 tablet, Rfl: 0   pantoprazole  (PROTONIX ) 40 MG tablet, TAKE 1 TABLET BY MOUTH EVERY DAY, Disp: 90 tablet, Rfl: 0   polyethylene glycol powder (GLYCOLAX /MIRALAX ) 17 GM/SCOOP powder, Take 17 g by mouth daily. (Patient taking differently: Take 17 g by mouth daily as needed for moderate constipation.), Disp: 3350 g, Rfl: 1   predniSONE  (DELTASONE ) 10 MG tablet, Directions for 6 day taper: Day 1: 2 tablets before breakfast, 1 after both lunch & dinner and 2 at bedtime Day 2: 1 tab before breakfast, 1 after both lunch & dinner and 2 at bedtime Day 3: 1 tab at each meal & 1 at bedtime Day 4: 1 tab at breakfast, 1 at lunch, 1 at bedtime Day 5: 1 tab at breakfast & 1 tab at bedtime Day 6: 1 tab at breakfast, Disp: 21 tablet, Rfl: 0   SUBOXONE  8-2 MG FILM, Place 0.5 Film under the tongue every 6 (six) hours., Disp: , Rfl:    Medications ordered in this encounter:  Meds ordered this  encounter  Medications   predniSONE  (DELTASONE ) 10 MG tablet    Sig: Directions for 6 day taper: Day 1: 2 tablets before breakfast, 1 after both lunch & dinner and 2 at bedtime Day 2: 1 tab before breakfast, 1 after both lunch & dinner and 2 at bedtime Day 3: 1 tab at each meal & 1 at bedtime Day 4: 1 tab at breakfast, 1 at lunch, 1 at bedtime Day 5: 1 tab at breakfast & 1 tab at bedtime Day 6: 1 tab at breakfast    Dispense:  21 tablet    Refill:  0    Supervising Provider:   BLAISE ALEENE KIDD [[id number]]   azithromycin  (ZITHROMAX ) 250 MG tablet    Sig: Take 2 tablets on day 1, then 1 tablet daily on days 2 through 5    Dispense:  6 tablet    Refill:  0     Supervising Provider:   LAMPTEY, PHILIP O [[id number]]   albuterol  (PROVENTIL ) (2.5 MG/3ML) 0.083% nebulizer solution    Sig: Take 3 mLs (2.5 mg total) by nebulization every 6 (six) hours as needed for wheezing or shortness of breath.    Dispense:  150 mL    Refill:  1    Supervising Provider:   BLAISE ALEENE KIDD [[id number]]     *If you need refills on other medications prior to your next appointment, please contact your pharmacy*  Follow-Up: Call back or seek an in-person evaluation if the symptoms worsen or if the condition fails to improve as anticipated.  [facility] [phone]  Other Instructions   If you have been instructed to have an in-person evaluation today at a local Urgent Care facility, please use the link below. It will take you to a list of all of our available [facility], including address, phone number and hours of operation. Please do not delay care.  [facility]  If you or a family member do not have a primary care provider, use the link below to schedule a visit and establish care. When you choose a [facility] primary care physician or advanced practice provider, you gain a long-term partner in health. Find a Primary Care Provider  Learn more about [facility] in-office and virtual care options: [facility] - Get Care Now

## 2023-11-08 ENCOUNTER — Ambulatory Visit: Payer: Self-pay | Admitting: Family Medicine

## 2023-11-08 DIAGNOSIS — R748 Abnormal levels of other serum enzymes: Secondary | ICD-10-CM

## 2023-11-09 ENCOUNTER — Emergency Department (HOSPITAL_COMMUNITY)

## 2023-11-09 ENCOUNTER — Emergency Department (HOSPITAL_COMMUNITY)
Admission: EM | Admit: 2023-11-09 | Discharge: 2023-11-09 | Disposition: A | Discharge status: Home / Self Care | Attending: Emergency Medicine | Admitting: Emergency Medicine

## 2023-11-09 DIAGNOSIS — Z8673 Personal history of transient ischemic attack (TIA), and cerebral infarction without residual deficits: Secondary | ICD-10-CM | POA: Diagnosis not present

## 2023-11-09 DIAGNOSIS — R42 Dizziness and giddiness: Secondary | ICD-10-CM | POA: Insufficient documentation

## 2023-11-09 DIAGNOSIS — J449 Chronic obstructive pulmonary disease, unspecified: Secondary | ICD-10-CM | POA: Diagnosis not present

## 2023-11-09 DIAGNOSIS — R059 Cough, unspecified: Secondary | ICD-10-CM | POA: Insufficient documentation

## 2023-11-09 DIAGNOSIS — R0981 Nasal congestion: Secondary | ICD-10-CM | POA: Diagnosis not present

## 2023-11-09 DIAGNOSIS — Z7951 Long term (current) use of inhaled steroids: Secondary | ICD-10-CM | POA: Diagnosis not present

## 2023-11-09 DIAGNOSIS — Z7982 Long term (current) use of aspirin: Secondary | ICD-10-CM | POA: Diagnosis not present

## 2023-11-09 DIAGNOSIS — R052 Subacute cough: Secondary | ICD-10-CM

## 2023-11-09 LAB — CBC WITH DIFFERENTIAL/PLATELET
Abs Immature Granulocytes: 0.08 K/uL — ABNORMAL HIGH (ref 0.00–0.07)
Basophils Absolute: 0 K/uL (ref 0.0–0.1)
Basophils Relative: 0 %
Eosinophils Absolute: 0 K/uL (ref 0.0–0.5)
Eosinophils Relative: 0 %
HCT: 36.1 % (ref 36.0–46.0)
Hemoglobin: 12.1 g/dL (ref 12.0–15.0)
Immature Granulocytes: 1 %
Lymphocytes Relative: 14 %
Lymphs Abs: 1.4 K/uL (ref 0.7–4.0)
MCH: 30.5 pg (ref 26.0–34.0)
MCHC: 33.5 g/dL (ref 30.0–36.0)
MCV: 90.9 fL (ref 80.0–100.0)
Monocytes Absolute: 0.5 K/uL (ref 0.1–1.0)
Monocytes Relative: 5 %
Neutro Abs: 7.6 K/uL (ref 1.7–7.7)
Neutrophils Relative %: 80 %
Platelets: 264 K/uL (ref 150–400)
RBC: 3.97 MIL/uL (ref 3.87–5.11)
RDW: 13 % (ref 11.5–15.5)
WBC: 9.6 K/uL (ref 4.0–10.5)
nRBC: 0 % (ref 0.0–0.2)

## 2023-11-09 LAB — URINALYSIS, ROUTINE W REFLEX MICROSCOPIC
Bilirubin Urine: NEGATIVE
Glucose, UA: NEGATIVE mg/dL
Hgb urine dipstick: NEGATIVE
Ketones, ur: NEGATIVE mg/dL
Leukocytes,Ua: NEGATIVE
Nitrite: NEGATIVE
Protein, ur: NEGATIVE mg/dL
Specific Gravity, Urine: 1.008 (ref 1.005–1.030)
pH: 6 (ref 5.0–8.0)

## 2023-11-09 LAB — LIPASE, BLOOD: Lipase: 17 U/L (ref 11–51)

## 2023-11-09 LAB — COMPREHENSIVE METABOLIC PANEL WITH GFR
ALT: 18 U/L (ref 0–44)
AST: 19 U/L (ref 15–41)
Albumin: 4.4 g/dL (ref 3.5–5.0)
Alkaline Phosphatase: 139 U/L — ABNORMAL HIGH (ref 38–126)
Anion gap: 11 (ref 5–15)
BUN: 13 mg/dL (ref 6–20)
CO2: 26 mmol/L (ref 22–32)
Calcium: 9.2 mg/dL (ref 8.9–10.3)
Chloride: 101 mmol/L (ref 98–111)
Creatinine, Ser: 0.65 mg/dL (ref 0.44–1.00)
GFR, Estimated: >60 mL/min (ref >60)
Glucose, Bld: 118 mg/dL — ABNORMAL HIGH (ref 70–99)
Potassium: 3.8 mmol/L (ref 3.5–5.1)
Sodium: 138 mmol/L (ref 135–145)
Total Bilirubin: 0.4 mg/dL (ref 0.0–1.2)
Total Protein: 7.6 g/dL (ref 6.5–8.1)

## 2023-11-09 LAB — TROPONIN T, HIGH SENSITIVITY
Troponin T High Sensitivity: <15 ng/L (ref 0–19)
Troponin T High Sensitivity: <15 ng/L (ref 0–19)

## 2023-11-09 LAB — RESP PANEL BY RT-PCR (RSV, FLU A&B, COVID)  RVPGX2
Influenza A by PCR: NEGATIVE
Influenza B by PCR: NEGATIVE
Resp Syncytial Virus by PCR: NEGATIVE
SARS Coronavirus 2 by RT PCR: NEGATIVE

## 2023-11-09 LAB — PRO BRAIN NATRIURETIC PEPTIDE: Pro Brain Natriuretic Peptide: 305 pg/mL — ABNORMAL HIGH (ref <300.0)

## 2023-11-09 MED ORDER — DIAZEPAM 2 MG PO TABS
2.0000 mg | ORAL_TABLET | Freq: Once | ORAL | Status: DC
Start: 2023-11-09 — End: 2023-11-09
  Filled 2023-11-09: qty 1

## 2023-11-09 MED ORDER — SODIUM CHLORIDE 0.9 % IV BOLUS: 1000.0000 mL | Freq: Once | INTRAVENOUS | Status: DC

## 2023-11-09 MED ORDER — AMOXICILLIN-POT CLAVULANATE 875-125 MG PO TABS: 1.0000 | ORAL_TABLET | Freq: Two times a day (BID) | ORAL | 0 refills | Status: DC

## 2023-11-09 MED ORDER — MECLIZINE HCL 12.5 MG PO TABS
25.0000 mg | ORAL_TABLET | Freq: Once | ORAL | Status: AC
  Administered 2023-11-09: 25 mg via ORAL
  Filled 2023-11-09: qty 2

## 2023-11-09 MED ORDER — IPRATROPIUM-ALBUTEROL 0.5-2.5 (3) MG/3ML IN SOLN
3.0000 mL | Freq: Once | RESPIRATORY_TRACT | Status: AC
  Administered 2023-11-09: 3 mL via RESPIRATORY_TRACT
  Filled 2023-11-09: qty 3

## 2023-11-09 MED ORDER — MECLIZINE HCL 25 MG PO TABS: 25.0000 mg | ORAL_TABLET | Freq: Three times a day (TID) | ORAL | 0 refills | Status: DC | PRN

## 2023-11-09 MED ORDER — SODIUM CHLORIDE 0.9 % IV BOLUS
1000.0000 mL | Freq: Once | INTRAVENOUS | Status: AC
  Administered 2023-11-09: 1000 mL via INTRAVENOUS

## 2023-11-09 NOTE — Discharge Instructions (Addendum)
 Today for dizziness.  Fortunately MRI did not show a stroke, it is much worse when you change positions, it is likely positional vertigo which is a problem with your inner ear.  Please take the meclizine  and follow-up with ENT.  You have new or worsening symptoms, especially if develop any numbness tingling weakness, difficulty speaking, come back to the ER right away.  Your chest x-ray showed a question of possibly fluid in your lung, though given your symptoms this seems more likely to be pneumonia.  Follow-up close with your PCP, if you are not getting better or your chest x-ray does not clear up you may need echocardiogram.  If you notice swelling in your legs, increased shortness of breath or any other symptoms you should come back to the hospital for further evaluation.

## 2023-11-09 NOTE — ED Triage Notes (Signed)
 Patient arrives via [facility], pt called out for dizziness. Dizziness not accompanied by moving, patient reports simply sitting still this morning resulted in dizziness. She says she is recently being treated for bronchitis and sinusitis and was treated with Augmentin  and has completed that course. On Sunday she contacted her MD and she was placed on azithromycin  and prednisone  for breathing difficulties. Albuterol  neb given in route by EMS.

## 2023-11-09 NOTE — ED Provider Notes (Signed)
 [place] EMERGENCY DEPARTMENT AT [facility] Provider Note   CSN: [id number] Arrival date & time: 11/09/23  [id number]     Patient presents with: Dizziness   Alice [name] is a 58 y.o. female.  She has history of CVA, substance abuse intermission since 2022, COPD, degenerative disc disease, GERD.  Presents ER today with chief complaint of dizziness described as spinning sensation.  Reports that has had nasal congestion and cough that started 6 days ago, initially had virtual visit and was diagnosed with sinusitis, prescribed Augmentin  and Flonase .  She states it became more prominent with congestion in her chest so she had another visit virtually 3 days ago, they told her her COPD was likely flaring with acute bronchitis, was told to stop the Augmentin  and started on prednisone  and azithromycin .  She was doing her nebulizers 3 times a day.  States she has less fatigue and generalized weakness but still has a lot of congestion and coughing.  Her primary concern, however was the fact that this morning she the bathroom, when she was walking back she states having started spinning, she sat down it lasted for 2 to 4 minutes and then resolved, she did not have any numbness tingling or weakness with this, had another episode after walking to the bathroom again.  She states she was having some double vision with this when she with her husband, she saw 2 sets of him.  This again lasted 2 to 4 minutes and was not associate with any numbness tingling or weakness.  Symptoms are now resolved, he still has the cough and congestion but otherwise feeling okay, she is pretty worried because she has had multiple strokes in the past.   Dizziness      Prior to Admission medications   Medication Sig Start Date End Date Taking? Authorizing Provider  albuterol  (PROVENTIL ) (2.5 MG/3ML) 0.083% nebulizer solution Take 3 mLs (2.5 mg total) by nebulization every 6 (six) hours as needed for wheezing or shortness of  breath. 11/06/23   Theotis Haze ORN, NP  aspirin  EC 81 MG tablet Take 1 tablet (81 mg total) by mouth daily with breakfast. For Stroke Prevention 07/25/21   Pearlean Manus, MD  atorvastatin  (LIPITOR) 40 MG tablet TAKE 1 TABLET BY MOUTH EVERY DAY 10/31/23   Severa Rock HERO, FNP  azithromycin  (ZITHROMAX ) 250 MG tablet Take 2 tablets on day 1, then 1 tablet daily on days 2 through 5 11/06/23 11/11/23  Fleming, Zelda W, NP  baclofen  (LIORESAL ) 10 MG tablet Take 1 tablet (10 mg total) by mouth 2 (two) times daily. 07/25/21   Pearlean Manus, MD  buprenorphine  (SUBUTEX ) 8 MG SUBL SL tablet Place 4 mg under the tongue in the morning, at noon, in the evening, and at bedtime. 03/21/23   [provider]  famotidine  (PEPCID ) 20 MG tablet TAKE 1 TABLET BY MOUTH EVERY DAY AFTER SUPPER 04/18/23   Wert, Michael B, MD  fluticasone  (FLONASE ) 50 MCG/ACT nasal spray Place 2 sprays into both nostrils daily. 11/04/23   Vivienne Delon HERO, PA-C  gabapentin  (NEURONTIN ) 100 MG capsule Take 1 capsule (100 mg total) by mouth 4 (four) times daily. Patient taking differently: Take 100 mg by mouth 2 (two) times daily. 01/14/22   Darlean Ozell NOVAK, MD  ibuprofen  (ADVIL ) 200 MG tablet Take 200 mg by mouth every 6 (six) hours as needed for moderate pain (pain score 4-6) or headache.    [provider]  ketoconazole  (NIZORAL ) 2 % cream APPLY 1  APPLICATION TOPICALLY DAILY 10/26/23   Severa Rock HERO, FNP  losartan  (COZAAR ) 25 MG tablet Take 1 tablet (25 mg total) by mouth daily. **NEEDS TO BE SEEN BEFORE NEXT REFILL** 09/13/23   Severa Rock HERO, FNP  pantoprazole  (PROTONIX ) 40 MG tablet TAKE 1 TABLET BY MOUTH EVERY DAY 10/31/23   Rakes, Rock HERO, FNP  polyethylene glycol powder (GLYCOLAX /MIRALAX ) 17 GM/SCOOP powder Take 17 g by mouth daily. Patient taking differently: Take 17 g by mouth daily as needed for moderate constipation. 03/12/22   Severa Rock HERO, FNP  predniSONE  (DELTASONE ) 10 MG tablet Directions for 6 day taper: Day  1: 2 tablets before breakfast, 1 after both lunch & dinner and 2 at bedtime Day 2: 1 tab before breakfast, 1 after both lunch & dinner and 2 at bedtime Day 3: 1 tab at each meal & 1 at bedtime Day 4: 1 tab at breakfast, 1 at lunch, 1 at bedtime Day 5: 1 tab at breakfast & 1 tab at bedtime Day 6: 1 tab at breakfast 11/06/23   Fleming, Zelda W, NP  SUBOXONE  8-2 MG FILM Place 0.5 Film under the tongue every 6 (six) hours. 10/15/20   [provider]    Allergies: Benadryl [diphenhydramine hcl], Codeine, and Tetracycline    Review of Systems  Neurological:  Positive for dizziness.    Updated Vital Signs Temp 98.2 F (36.8 C) (Oral)   Ht 6' (1.829 m)   Wt 90.7 kg   LMP 07/30/2011   SpO2 93%   BMI 27.12 kg/m   Physical Exam Vitals and nursing note reviewed.  Constitutional:      General: She is not in acute distress.    Appearance: She is well-developed.  HENT:     Head: Normocephalic and atraumatic.     Mouth/Throat:     Mouth: Mucous membranes are moist.     Pharynx: No oropharyngeal exudate or posterior oropharyngeal erythema.  Eyes:     General: No visual field deficit.    Extraocular Movements: Extraocular movements intact.     Conjunctiva/sclera: Conjunctivae normal.     Pupils: Pupils are equal, round, and reactive to light.     Comments: No nystagmus  Cardiovascular:     Rate and Rhythm: Normal rate and regular rhythm.     Heart sounds: No murmur heard. Pulmonary:     Effort: Pulmonary effort is normal. No respiratory distress.     Breath sounds: Normal breath sounds.  Abdominal:     Palpations: Abdomen is soft.     Tenderness: There is no abdominal tenderness.  Musculoskeletal:        General: No swelling.     Cervical back: Neck supple.  Skin:    General: Skin is warm and dry.     Capillary Refill: Capillary refill takes less than 2 seconds.  Neurological:     General: No focal deficit present.     Mental Status: She is alert and oriented to person,  place, and time.     Cranial Nerves: No cranial nerve deficit or facial asymmetry.     Sensory: No sensory deficit.     Motor: No weakness or tremor.     Coordination: Coordination normal. Finger-Nose-Finger Test normal. Rapid alternating movements normal.     Gait: Gait normal.  Psychiatric:        Mood and Affect: Mood normal.        Behavior: Behavior normal.     (all labs ordered are listed, but only abnormal  results are displayed) Labs Reviewed - No data to display  EKG: None  Radiology: No results found.   Procedures   Medications Ordered in the ED - No data to display                                  Medical Decision Making Differential diagnosis includes but not limited to peripheral vertigo, CVA, labyrinthitis, pneumonia, COPD exacerbation, sinusitis, other  Labs: CBC, CMP, troponin are all reassuring, lipase is normal.  proBNP is elevated but not definitely suggestive of acute heart failure.  EKG: Sinus rhythm  ED course: Patient presents to the ER complaining of cough and congestion as well as dizziness.  His vertigo this morning.  This happens when she changes positions, no hypotension or syncope, no oxygen  saturation has been normal on room air, patient had chest x-ray that showed possible interstitial edema and small pleural effusion, she has no lower extremity swelling, normal echo in 2023, symptoms are more consistent with a any infectious cause, she had been initially started on Augmentin  and then stopped that after just a couple of days ago switch to Zithromax .  Plan to restart Augmentin  and have her follow-up close with PCP.  Advise she would need repeat chest x-ray and if not improved or if she has any swelling or worsening symptoms she would likely need echo, given strict return precautions.  History of stroke she was concerned about this regarding her dizziness.  Seems more peripheral and MRI showed stable changes with no acute findings.  She was given  meclizine  with minimal improvement, offered Valium  but patient adamantly refused because she is in recovery from polysubstance abuse.  Discussed treating her infection and have her follow-up with ENT.  She is given strict return precautions.  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    Amount and/or Complexity of Data Reviewed Labs: ordered. Radiology: ordered.  Risk Prescription drug management.        Final diagnoses:  None    ED Discharge Orders     None          Suellen Sherran LABOR, PA-C 11/09/23 1532    Elnor Jayson LABOR, DO 11/10/23 [phone]

## 2023-11-12 ENCOUNTER — Telehealth: Admitting: Family Medicine

## 2023-11-12 DIAGNOSIS — J440 Chronic obstructive pulmonary disease with acute lower respiratory infection: Secondary | ICD-10-CM | POA: Diagnosis not present

## 2023-11-12 DIAGNOSIS — J209 Acute bronchitis, unspecified: Secondary | ICD-10-CM | POA: Diagnosis not present

## 2023-11-12 NOTE — Progress Notes (Signed)
 Virtual Visit Consent   Alice [name], you are scheduled for a virtual visit with a [facility] provider today. Just as with appointments in the office, your consent must be obtained to participate. Your consent will be active for this visit and any virtual visit you may have with one of our providers in the next 365 days. If you have a MyChart account, a copy of this consent can be sent to you electronically.  As this is a virtual visit, video technology does not allow for your provider to perform a traditional examination. This may limit your provider's ability to fully assess your condition. If your provider identifies any concerns that need to be evaluated in person or the need to arrange testing (such as labs, EKG, etc.), we will make arrangements to do so. Although advances in technology are sophisticated, we cannot ensure that it will always work on either your end or our end. If the connection with a video visit is poor, the visit may have to be switched to a telephone visit. With either a video or telephone visit, we are not always able to ensure that we have a secure connection.  By engaging in this virtual visit, you consent to the provision of healthcare and authorize for your insurance to be billed (if applicable) for the services provided during this visit. Depending on your insurance coverage, you may receive a charge related to this service.  I need to obtain your verbal consent now. Are you willing to proceed with your visit today? [name] has provided verbal consent on 11/12/2023 for a virtual visit (video or telephone). Loa Lamp, FNP  Date: 11/12/2023 10:09 AM   Virtual Visit via Video Note   I, Loa Lamp, connected with  Alice [name]  ([id number], 01/13/1965) on 11/12/23 at 10:00 AM EDT by a video-enabled telemedicine application and verified that I am speaking with the correct person using two identifiers.  Location: Patient: Virtual Visit Location Patient:  Home Provider: Virtual Visit Location Provider: Home Office   I discussed the limitations of evaluation and management by telemedicine and the availability of in person appointments. The patient expressed understanding and agreed to proceed.    History of Present Illness: Alice [name] is a 58 y.o. who identifies as a female who was assigned female at birth, and is being seen today for cough, head congestion, had pneumonia for 7 days, history of 3 cvas. Has been on augmentin , flonase  thenwent to ED and they put her on augmentin . Still on prednisone . See labs and cxr. She is very concerned about labs and reports and wants to discuss.   HPI: HPI  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 01/15/2022   GERD (gastroesophageal reflux disease) 07/24/2021   Depression, major, single episode, moderate (HCC) 10/22/2020   GAD (generalized anxiety disorder) 11/01/2018   Vitamin D  deficiency 11/01/2018   H/o Prior ischemic stroke (HCC) 09/03/2016   History of stroke 05/09/2016   History of substance abuse (HCC) 05/09/2016   Heart murmur 01/08/2016   Late effects of cerebral ischemic  stroke 10/04/2015   Dyslipidemia 10/04/2015   HTN (hypertension) 10/04/2015   Discitis 10/04/2015   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/08/2015   Postlaminectomy syndrome, lumbar region 12/04/2012   Anemia 06/17/2012   Chronic low back pain 06/17/2012   DDD (degenerative disc disease), lumbosacral 06/17/2012   Sacroiliitis 06/17/2012    Precordial pain 02/18/2010   Cigarette smoker 12/05/2009   Anxiety state 12/05/2009   Palpitations 12/05/2009    Allergies:  Allergies  Allergen Reactions   Benadryl [Diphenhydramine Hcl] Hives   Codeine Rash   Tetracycline Itching and Rash   Medications:  Current Outpatient Medications:    albuterol  (PROVENTIL ) (2.5 MG/3ML) 0.083% nebulizer solution, Take 3 mLs (2.5 mg total) by nebulization every 6 (six) hours as needed for wheezing or shortness of breath., Disp: 150 mL, Rfl: 1   amoxicillin -clavulanate (AUGMENTIN ) 875-125 MG tablet, Take 1 tablet by mouth every 12 (twelve) hours., Disp: 14 tablet, Rfl: 0   aspirin  EC 81 MG tablet, Take 1 tablet (81 mg total) by mouth daily with breakfast. For Stroke Prevention, Disp: 30 tablet, Rfl: 11   atorvastatin  (LIPITOR) 40 MG tablet, TAKE 1 TABLET BY MOUTH EVERY DAY, Disp: 90 tablet, Rfl: 0   baclofen  (LIORESAL ) 10 MG tablet, Take 1 tablet (10 mg total) by mouth 2 (two) times daily., Disp: 60 tablet, Rfl: 3   buprenorphine  (SUBUTEX ) 8 MG SUBL SL tablet, Place 4 mg under the tongue in the morning, at noon, in the evening, and at bedtime., Disp: , Rfl:    famotidine  (PEPCID ) 20 MG tablet, TAKE 1 TABLET BY MOUTH EVERY DAY AFTER SUPPER, Disp: 30 tablet, Rfl: 11   fluticasone  (FLONASE ) 50 MCG/ACT nasal spray, Place 2 sprays into both nostrils daily., Disp: 16 g, Rfl: 0   gabapentin  (NEURONTIN ) 100 MG capsule, Take 1 capsule (100 mg total) by mouth 4 (four) times daily. (Patient taking differently: Take 100 mg by mouth 2 (two) times daily.), Disp: 120 capsule, Rfl: 2   ibuprofen  (ADVIL ) 200 MG tablet, Take 200 mg by mouth every 6 (six) hours as needed for moderate pain (pain score 4-6) or headache., Disp: , Rfl:    ketoconazole  (NIZORAL ) 2 % cream, APPLY 1 APPLICATION TOPICALLY DAILY, Disp: 30 g, Rfl: 0   losartan  (COZAAR ) 25 MG tablet, Take 1 tablet (25 mg total) by mouth daily. **NEEDS TO BE SEEN BEFORE NEXT REFILL**, Disp: 30 tablet, Rfl: 0    meclizine  (ANTIVERT ) 25 MG tablet, Take 1 tablet (25 mg total) by mouth 3 (three) times daily as needed for dizziness., Disp: 30 tablet, Rfl: 0   pantoprazole  (PROTONIX ) 40 MG tablet, TAKE 1 TABLET BY MOUTH EVERY DAY, Disp: 90 tablet, Rfl: 0   polyethylene glycol powder (GLYCOLAX /MIRALAX ) 17 GM/SCOOP powder, Take 17 g by mouth daily. (Patient taking differently: Take 17 g by mouth daily as needed for moderate constipation.), Disp: 3350 g, Rfl: 1   predniSONE  (DELTASONE ) 10 MG tablet, Directions for 6 day taper: Day 1: 2 tablets before breakfast, 1 after both lunch & dinner and 2 at bedtime Day 2: 1 tab before breakfast, 1 after both lunch & dinner and 2 at bedtime Day 3: 1 tab at each meal & 1 at bedtime Day 4: 1 tab at breakfast, 1 at lunch, 1 at bedtime Day 5: 1 tab at breakfast & 1 tab at bedtime Day 6: 1 tab at breakfast, Disp: 21 tablet, Rfl: 0   SUBOXONE  8-2 MG FILM, Place 0.5 Film under the tongue every 6 (  six) hours., Disp: , Rfl:   Observations/Objective: Patient is well-developed, well-nourished in no acute distress.  Resting comfortably  at home.  Head is normocephalic, atraumatic.  No labored breathing.  Speech is clear and coherent with logical content.  Patient is alert and oriented at baseline.    Assessment and Plan: There are no diagnoses linked to this encounter. Increase fluids, continue present regimen, UC or ED if sx worsen. Labs reviewed --Visit total of 35 min.   Follow Up Instructions: I discussed the assessment and treatment plan with the patient. The patient was provided an opportunity to ask questions and all were answered. The patient agreed with the plan and demonstrated an understanding of the instructions.  A copy of instructions were sent to the patient via MyChart unless otherwise noted below.     The patient was advised to call back or seek an in-person evaluation if the symptoms worsen or if the condition fails to improve as anticipated.    Haila Dena,  FNP

## 2023-11-12 NOTE — Patient Instructions (Signed)
 Chronic Obstructive Pulmonary Disease Exacerbation  Chronic obstructive pulmonary disease (COPD) is a long-term (chronic) lung problem. When you have COPD, it can feel harder to breathe in or out. COPD exacerbation is a flare-up of symptoms when breathing gets worse and more treatment may be needed. Without treatment, flare-ups can be life-threatening. If they happen often, your lungs can become more damaged. What are the causes? Not taking your usual COPD medicines as told by your health care provider. A cold or the flu, which can cause infection in your lungs. Being exposed to things that make your breathing worse, such as: Smoke. Air pollution. Fumes. Dust. Allergies. Weather changes. What are the signs or symptoms? Symptoms do not get better or get worse even if you take your medicines as told by your provider. Symptoms may include: More shortness of breath. You may only be able to speak one or two words at a time. More coughing or mucus from your lungs. More wheezing or chest tightness. Being more tired and having less energy. Confusion. How is this diagnosed? This condition is diagnosed based on: Symptoms that get worse. Your medical history. A physical exam. You may also have tests, including: A chest X-ray. Blood or mucus tests. How is this treated? You may be able to stay home or you may need to go to the hospital. Treatment may include: Taking medicines. These may include: Inhalers. These have medicines in them that you breathe in. These may be more of what you already take or they may be new. Steroids. These reduce inflammation in the airways. These may be inhaled, taken by mouth, or given in an IV. Antibiotics. These treat infection. Using oxygen. Using a device to help you clear mucus. Follow these instructions at home: Medicines Take your medicines only as told by your provider. If you were given antibiotics or steroids, take them as told by your provider. Do  not stop taking them even if you start to feel better. Lifestyle Several times a day, wash your hands with soap and water for at least 20 seconds. If you cannot use soap and water, use hand sanitizer. This may help keep you from getting an infection. Avoid being around crowds or people who are sick. Do not smoke or use any products that contain nicotine or tobacco. If you need help quitting, ask your provider. Return to your normal activities when your provider says that it's safe. Use breathing methods to control your stress and catch your breath. How is this prevented? Follow your COPD action plan. The action plan tells you what to do if you're feeling good and what to do when you start feeling worse. Discuss the plan often with your provider. Make sure you get all the shots, also called vaccines, that your provider recommends. Ask your provider about a flu shot and a pneumonia shot. Use oxygen therapy if told by your provider. If you need home oxygen therapy, ask your provider how often to check your oxygen level with a device called an oximeter. Keep all follow-up visits to review your COPD action plan. Your provider will want to check on your condition often to keep you healthy and out of the hospital. Contact a health care provider if: Your COPD symptoms get worse. You have a fever or chills. You have trouble doing daily activities. You have trouble breathing even when you are resting. Get help right away if: You are short of breath and cannot: Talk in full sentences. Do normal activities. You have chest  pain. You feel confused. These symptoms may be an emergency. Call 911 right away. Do not wait to see if the symptoms will go away. Do not drive yourself to the hospital. This information is not intended to replace advice given to you by your health care provider. Make sure you discuss any questions you have with your health care provider. Document Revised: 09/30/2022 Document  Reviewed: 03/15/2022 Elsevier Patient Education  2024 [employer].

## 2023-11-14 ENCOUNTER — Encounter: Payer: Self-pay | Admitting: Family Medicine

## 2023-11-18 ENCOUNTER — Ambulatory Visit (INDEPENDENT_AMBULATORY_CARE_PROVIDER_SITE_OTHER): Admitting: Family Medicine

## 2023-11-18 ENCOUNTER — Encounter: Payer: Self-pay | Admitting: Family Medicine

## 2023-11-18 ENCOUNTER — Ambulatory Visit: Payer: Self-pay | Admitting: Family Medicine

## 2023-11-18 ENCOUNTER — Ambulatory Visit (INDEPENDENT_AMBULATORY_CARE_PROVIDER_SITE_OTHER)

## 2023-11-18 VITALS — BP 128/94 | HR 92 | Temp 97.9°F | Ht 73.0 in | Wt 200.4 lb

## 2023-11-18 DIAGNOSIS — J441 Chronic obstructive pulmonary disease with (acute) exacerbation: Secondary | ICD-10-CM

## 2023-11-18 DIAGNOSIS — R0602 Shortness of breath: Secondary | ICD-10-CM | POA: Diagnosis not present

## 2023-11-18 DIAGNOSIS — M940 Chondrocostal junction syndrome [Tietze]: Secondary | ICD-10-CM

## 2023-11-18 MED ORDER — DOXYCYCLINE HYCLATE 100 MG PO TABS: 100.0000 mg | ORAL_TABLET | Freq: Two times a day (BID) | ORAL | 0 refills | Status: AC

## 2023-11-18 MED ORDER — GUAIFENESIN ER 600 MG PO TB12: 600.0000 mg | ORAL_TABLET | Freq: Two times a day (BID) | ORAL | 0 refills | Status: DC

## 2023-11-18 MED ORDER — ALBUTEROL SULFATE HFA 108 (90 BASE) MCG/ACT IN AERS: 2.0000 | INHALATION_SPRAY | Freq: Four times a day (QID) | RESPIRATORY_TRACT | 0 refills | Status: AC | PRN

## 2023-11-18 MED ORDER — METHYLPREDNISOLONE ACETATE 80 MG/ML IJ SUSP
80.0000 mg | Freq: Once | INTRAMUSCULAR | Status: AC
  Administered 2023-11-18: 60 mg via INTRAMUSCULAR

## 2023-11-18 MED ORDER — BENZONATATE 200 MG PO CAPS: 200.0000 mg | ORAL_CAPSULE | Freq: Two times a day (BID) | ORAL | 0 refills | Status: DC | PRN
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
## 2023-11-23 ENCOUNTER — Ambulatory Visit: Payer: Self-pay | Admitting: Family Medicine

## 2023-11-23 ENCOUNTER — Encounter: Payer: Self-pay | Admitting: Family Medicine

## 2023-11-23 ENCOUNTER — Ambulatory Visit (HOSPITAL_COMMUNITY)
Admission: RE | Admit: 2023-11-23 | Discharge: 2023-11-23 | Disposition: A | Discharge status: Home / Self Care | Source: Ambulatory Visit | Attending: Family Medicine | Admitting: Family Medicine

## 2023-11-23 DIAGNOSIS — R935 Abnormal findings on diagnostic imaging of other abdominal regions, including retroperitoneum: Secondary | ICD-10-CM

## 2023-11-23 DIAGNOSIS — R748 Abnormal levels of other serum enzymes: Secondary | ICD-10-CM

## 2023-11-28 ENCOUNTER — Ambulatory Visit: Payer: Self-pay | Admitting: Internal Medicine

## 2023-11-28 ENCOUNTER — Ambulatory Visit (INDEPENDENT_AMBULATORY_CARE_PROVIDER_SITE_OTHER): Admitting: Gastroenterology

## 2023-11-28 ENCOUNTER — Encounter (INDEPENDENT_AMBULATORY_CARE_PROVIDER_SITE_OTHER): Payer: Self-pay

## 2023-11-28 ENCOUNTER — Ambulatory Visit (INDEPENDENT_AMBULATORY_CARE_PROVIDER_SITE_OTHER): Admitting: Internal Medicine

## 2023-11-28 ENCOUNTER — Encounter: Payer: Self-pay | Admitting: Internal Medicine

## 2023-11-28 VITALS — BP 134/84 | HR 77 | Temp 98.0°F | Ht 73.0 in | Wt 204.8 lb

## 2023-11-28 DIAGNOSIS — F17210 Nicotine dependence, cigarettes, uncomplicated: Secondary | ICD-10-CM

## 2023-11-28 DIAGNOSIS — J310 Chronic rhinitis: Secondary | ICD-10-CM | POA: Insufficient documentation

## 2023-11-28 DIAGNOSIS — J449 Chronic obstructive pulmonary disease, unspecified: Secondary | ICD-10-CM | POA: Diagnosis not present

## 2023-11-28 DIAGNOSIS — M40204 Unspecified kyphosis, thoracic region: Secondary | ICD-10-CM | POA: Diagnosis not present

## 2023-11-28 DIAGNOSIS — S22060A Wedge compression fracture of T7-T8 vertebra, initial encounter for closed fracture: Secondary | ICD-10-CM | POA: Insufficient documentation

## 2023-11-28 MED ORDER — METHYLPREDNISOLONE ACETATE 80 MG/ML IJ SUSP
120.0000 mg | Freq: Once | INTRAMUSCULAR | Status: AC
  Administered 2023-11-28: 120 mg via INTRAMUSCULAR

## 2023-11-28 NOTE — Progress Notes (Signed)
 Alice [name], female    DOB: 11-Feb-1965    MRN: [id number]   Brief patient profile:  38 yowf  active smoker from [place], [place]  referred to pulmonary clinic in [facility]  01/14/2022 by Rock Bruns  for copd eval     History of Present Illness  01/14/2022  Pulmonary/ 1st office eval/ Ryna Beckstrom / [place] [facility] maint on Anoro /ACEi / low dose ppi  Chief Complaint  Patient presents with   Consult    SOB/ COPD   Dyspnea:  baseline (oct 2023) still needing neb 3 x daily  Still house work  / doe across the house front door to kitchen ride scooter to shop / walks bent over walking   Cough: nonp productive day > noct (as long as sleeps at 45 degrees) even when better still coughing but much worse since Nov 2023 with bilateral sym lat cp just with coughing fits   Sleep: much worse when try to sleep flat so using recliner x 45 degrees  SABA use: neb 3 x daily  02: none  Vax never vax/ once infected   Rec Pantoprazole  (protonix ) 40 mg   Take  30-60 min before first meal of the day and add  Pepcid  (famotidine )  20 mg after supper until return to office - this is the best way to tell whether stomach acid is contributing to your problem.   Stop all smoking Stop lisinopil and anoro  Just use your nebulizer up every 4 hours as needed for cough or breathing problems  Diovan  160 (valsartan ) one daily  -  ok to break in half if too strong Gabapentin  100 mg increased to 4 x daily  For cough > mucinex  dm 1200 mg every 12 hours and avoid mint menthol chocolate  Return with all  inhalers/ solutions in hand       07/09/2022  f/u ov/[place] office/Loa Idler re: ? Acei case maint on did not  bring meds  Chief Complaint  Patient presents with   Follow-up  Dyspnea:  rollator  limited by back x 7 min  Cough: non productive, has not tried mucinex  dm yet Sleeping: in bed 45 degrees - no longer in recliner  SABA use: already used 4 h prior to ov 02: none   Rec My office will be contacting you by phone for  referral for lung cancer screening  > did not do as of 10/08/2022  Take two of gabapentin  in evening should help with cough and can adjust upward with your pain doctor 07/12/22 if needed For cough/ congestion >  mucinex  dm  up to maximum of  1200 mg every 12 hours as needed    The key is to stop smoking completely before smoking completely stops you!  Please schedule a follow up visit in 3 months but call sooner if needed with pfts on return   10/08/2022  f/u ov/[place] office/Tezra Mahr re: GOLD ? / still smoking  maint on prn neb   Chief Complaint  Patient presents with   Upper airway cough syndrome   Dyspnea: using  rollator due to back pain  / mailbox and return  ok  50 ft flat  Cough: none  Sleeping: 45 degrees s  resp cc  SABA use: neb every few weeks  02: none  Rec The key is to stop smoking LDSCT  12/01/22 no nodules/  Mild diffuse bronchial wall thickening with mild centrilobular and paraseptal emphysema  -  PFT's  12/30/22  FEV1 2.62 (75 % )  ratio 0.77  p 0 % improvement from saba p 0 prior to study with DLCO  18.11 (69%)   and FV curve nl  and ERV 51 at wt 194       06/15/2023  f/u ov/[place] office/Chrystopher Stangl re: GOLD 0 copd/ mild emphysema on CT  maint on no resp rx   Chief Complaint  Patient presents with   Shortness of Breath   COPD   Follow-up  Dyspnea:  Not limited by breathing from desired activities   Cough: slt smoker's rattle  Sleeping: flat bed with bunch of pillows on side s    resp cc  SABA use: not using  02: none  Rec F/u  Prn     07/14/23  CT chest  LDSCT 1. Lung-RADS 2, benign appearance or behavior. Continue annual screening with low-dose chest CT without contrast in 12 months. 2. Mild coronary artery calcification. 3. Stable mild bronchial wall thickening in keeping with chronic airway inflammation. 4. Stable remote compression deformities of T4, T7, T9, T10, and L1. No acute bone abnormality. 5.  Aortic Atherosclerosis (ICD10-I70.0).  11/28/2023  ACUTE  ov/[place] office/Brelyn Woehl re: severe cough / cp  maint on no resp rx   re 3 weeks head cold> sinusitis/ bronchitis r x augmentin  / zpak/ pred / still smoking / last pred 11/18/23  Chief Complaint  Patient presents with   Acute Visit    Increased SOB, cough and chest discomfort for approx a month.    Dyspnea:  walk slow / flat surfaces  Cough:  brown on doxy / severe and causes circumferential pain  around T 7  dermatome sym both sides = whne cough radiating to T spine  Sleeping: sitting up ok until morning helps, sitting back makes the pain worse  SABA use: hfa / neb prn but not in last 2 days and no worse  02: none     No obvious day to day or daytime variability or assoc  mucus plugs or hemoptysis   or chest tightness, subjective wheeze or overt   hb symptoms.    Also denies any obvious fluctuation of symptoms with weather or environmental changes or other aggravating or alleviating factors except as outlined above   No unusual exposure hx or h/o childhood pna/ asthma or knowledge of premature birth.  Current Allergies, Complete Past Medical History, Past Surgical History, Family History, and Social History were reviewed in [employer] record.  ROS  The following are not active complaints unless bolded Hoarseness, sore throat, dysphagia, dental problems, itching, sneezing,  nasal congestion or discharge of excess mucus or purulent secretions, ear ache,   fever, chills, sweats, unintended wt loss or wt gain, classically pleuritic or exertional cp,  orthopnea pnd or arm/hand swelling  or leg swelling, presyncope, palpitations, abdominal pain, anorexia, nausea, vomiting, diarrhea  or change in bowel habits or change in bladder habits, change in stools or change in urine, dysuria, hematuria,  rash, arthralgias, visual complaints, headache, numbness, weakness or ataxia or problems with walking or coordination,  change in mood or  memory.        Current Meds   Medication Sig   albuterol  (PROVENTIL ) (2.5 MG/3ML) 0.083% nebulizer solution Take 3 mLs (2.5 mg total) by nebulization every 6 (six) hours as needed for wheezing or shortness of breath.   albuterol  (VENTOLIN  HFA) 108 (90 Base) MCG/ACT inhaler Inhale 2 puffs into the lungs every 6 (six) hours as needed for wheezing or shortness of breath.  aspirin  EC 81 MG tablet Take 1 tablet (81 mg total) by mouth daily with breakfast. For Stroke Prevention   atorvastatin  (LIPITOR) 40 MG tablet TAKE 1 TABLET BY MOUTH EVERY DAY   baclofen  (LIORESAL ) 10 MG tablet Take 1 tablet (10 mg total) by mouth 2 (two) times daily.   benzonatate  (TESSALON ) 200 MG capsule Take 1 capsule (200 mg total) by mouth 2 (two) times daily as needed for cough.   buprenorphine  (SUBUTEX ) 8 MG SUBL SL tablet Place 4 mg under the tongue in the morning, at noon, in the evening, and at bedtime.   doxycycline  (VIBRA -TABS) 100 MG tablet Take 1 tablet (100 mg total) by mouth 2 (two) times daily for 10 days.   famotidine  (PEPCID ) 20 MG tablet TAKE 1 TABLET BY MOUTH EVERY DAY AFTER SUPPER   fluticasone  (FLONASE ) 50 MCG/ACT nasal spray Place 2 sprays into both nostrils daily.   gabapentin  (NEURONTIN ) 100 MG capsule Take 1 capsule (100 mg total) by mouth 4 (four) times daily. (Patient taking differently: Take 100 mg by mouth 2 (two) times daily.)   ketoconazole  (NIZORAL ) 2 % cream APPLY 1 APPLICATION TOPICALLY DAILY   losartan  (COZAAR ) 25 MG tablet Take 1 tablet (25 mg total) by mouth daily. 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   Qualifier: Diagnosis of  By: Delbra Krebs     Chronic low back pain    Cocaine abuse (HCC)    COPD (chronic obstructive pulmonary disease) (HCC)    Heart murmur    HLD (hyperlipidemia)    Hypertension    Stroke (HCC)    x4     Objective:     Wts  11/28/2023      204  06/15/2023         195  10/08/2022       195  07/09/22 189 lb 3.2 oz (85.8 kg)  06/23/22 197 lb (89.4 kg)  06/15/22 196 lb (88.9 kg)     Vital signs reviewed  11/28/2023  - Note at rest 02 sats  97% on RA   General appearance:    amb somber wf appears to be in severe pain when coughing    HEENT : Oropharynx  clear/ edentulous  Nasal turbinates severe TE s watery d/c no polyps   NECK :  without  apparent JVD/ palpable Nodes/TM    LUNGS: no acc muscle use,  Min barrel  contour chest wall with bilateral  slightly decreased bs s audible wheeze and  without cough on insp or exp maneuvers and min  Hyperresonant  to  percussion bilaterally    CV:  RRR  no s3 or murmur or increase in P2, and no edema   ABD:  soft and nontender    MS:  Nl gait/ ext warm without deformities Or obvious joint restrictions  calf tenderness, cyanosis or clubbing     SKIN: warm and dry without lesions    NEURO:  alert, approp, nl sensorium with  no motor or cerebellar deficits apparent.           I personally reviewed images and agree with radiology impression as follows:  CXR:   pa and lat 11/18/23 No acute findings  My reviewe: severe kyphosis/ multiple T spine Fx    Assessment  Assessment & Plan Compression fracture of T7 vertebra, initial encounter (HCC) Assoc severe radicular pain with cough or deep breath circumferential  >>> try titrating up gabapentin  100 to a max of 200 mg qid or highest tol dose whichever comes first  >>>  control coughing with mucinex  dm and flutter valve  >>> refer to ortho  urgently    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 Active smoker with main concern cough as of 01/14/22  - 01/14/22 d/c acei > cough resolved then recurred on macrodantin  > resolved again  - 10/08/2022   Walked on RA  x  2  lap(s) =  approx 300  ft  @  back pain slow  pace, stopped due to back pain with lowest 02 sats 97%   - LDSCT  12/01/22 mild centrilobular and  paraseptal emphysema  -  PFT's  12/30/22  FEV1 2.62 (75 % ) ratio 0.77  p 0 % improvement from saba p 0 prior to study with DLCO  18.11 (69%)   and FV curve nl  and ERV 51% at wt 194     Acute bronchitis already rx with approp abx   >>> depomedrol 120 mg IM   >>> muicnex dm 1200 mg bid and flutter valve   >>> continue gerd rx   >>> just use neb saba prn, avoid inhalers given severity of coughing fits and unable to use narcotics for cough    Cigarette smoker Counseled re importance of smoking cessation but did not meet time criteria for separate billing    F/u can be prn   Rhinitis, chronic Failed astelin monotherapy with severe nasal symptoms that previously responded to pred so  >>>> rec depomedrol 120 mg IM and approp use of afrin to reach the target tissue with 5 d limit on afrin followed by at leas 5 days off  >>> f/u ent prn    Each maintenance medication was reviewed in detail including emphasizing most importantly the difference between maintenance and prns and under what circumstances the prns are to be triggered using an action plan format where appropriate.  Total time for H and P, chart review, counseling, reviewing hfa/ neb/nasal  device(s) and generating customized AVS unique to this ACUTE office visit / same day charting = 40 min acute eval for refratory cough and chest pain.     AVS  Patient Instructions  Depomedrol 120 mg IM   I emphasized that nasal steroids have no immediate benefit in terms of improving symptoms.  To help them reached the target tissue, the patient should use Afrin two puffs every 12 hours applied one min before using the nasal steroids.  Afrin should be stopped after no more than 5 days.  If the symptoms worsen, Afrin can be restarted after 5 days off of therapy to prevent rebound congestion from overuse of Afrin.  I also emphasized that in no way are nasal steroids a concern in terms of addiction.    For cough/ congestion >  mucinex  dm   up to maximum of  1200 mg every 12 hours and use the flutter valve as much as you can     My office will be contacting you by phone for referral to orthopedics in    - if you don't hear back from my office within one week please call us  back or notify us  thru MyChart and we'll address it right away.   Stop all inhalers and just use the nebulizer up  to every 4 hours if needed.  Gradually increase the gabapentin  100 go maxiumum of 200 mg four times a day or whatever dose controls the pain and the cough - if woozy go back by 100 mg per day   Follow up is as needed          Ozell America, MD 11/28/2023

## 2023-11-28 NOTE — Assessment & Plan Note (Addendum)
 Failed astelin monotherapy with severe nasal symptoms that previously responded to pred so  >>>> rec depomedrol 120 mg IM and approp use of afrin to reach the target tissue with 5 d limit on afrin followed by at leas 5 days off  >>> f/u ent prn    Each maintenance medication was reviewed in detail including emphasizing most importantly the difference between maintenance and prns and under what circumstances the prns are to be triggered using an action plan format where appropriate.  Total time for H and P, chart review, counseling, reviewing hfa/ neb/nasal  device(s) and generating customized AVS unique to this ACUTE office visit / same day charting = 40 min acute eval for refratory cough and chest pain.

## 2023-11-28 NOTE — Telephone Encounter (Signed)
 Patient called and call disconnected prior to transfer from [facility]. NT attempted to contact patient back to review worsening sx of cough , decrease balance chest pain SOB. No answer, LVMTCB [phone].       Copied from CRM [id number]. Topic: Clinical - Red Word Triage >> Nov 28, 2023  8:39 AM Alice [name] wrote: Red Word that prompted transfer to Nurse Triage: Pt sees Dr. Darlean in [place].  Pt stated she developed bronchitis that turned into pneumonia on 11/09/2023. Pt was at [facility] at [facility]. Pt stated she's on her 4th round of antibiotics.  Sx: Pt stated she is currently still having intense coughing that causes her to lose her balance and pain in chest from coughing, SOB, shoulder blade pain, and she's coughing up green discolored phlegm. Pt stated she was told her liver was swollen as well.

## 2023-11-28 NOTE — Assessment & Plan Note (Addendum)
 Counseled re importance of smoking cessation but did not meet time criteria for separate billing    F/u can be prn

## 2023-11-28 NOTE — Patient Instructions (Addendum)
 Depomedrol 120 mg IM   I emphasized that nasal steroids have no immediate benefit in terms of improving symptoms.  To help them reached the target tissue, the patient should use Afrin two puffs every 12 hours applied one min before using the nasal steroids.  Afrin should be stopped after no more than 5 days.  If the symptoms worsen, Afrin can be restarted after 5 days off of therapy to prevent rebound congestion from overuse of Afrin.  I also emphasized that in no way are nasal steroids a concern in terms of addiction.    For cough/ congestion >  mucinex  dm  up to maximum of  1200 mg every 12 hours and use the flutter valve as much as you can     My office will be contacting you by phone for referral to orthopedics in [place]   - if you don't hear back from my office within one week please call us  back or notify us  thru MyChart and we'll address it right away.   Stop all inhalers and just use the nebulizer up to every 4 hours if needed.  Gradually increase the gabapentin  100 go maxiumum of 200 mg four times a day or whatever dose controls the pain and the cough - if woozy go back by 100 mg per day   Follow up is as needed

## 2023-11-28 NOTE — Assessment & Plan Note (Addendum)
 Assoc severe radicular pain with cough or deep breath circumferential  >>> try titrating up gabapentin  100 to a max of 200 mg qid or highest tol dose whichever comes first  >>>  control coughing with mucinex  dm and flutter valve  >>> refer to ortho  urgently

## 2023-11-28 NOTE — Assessment & Plan Note (Addendum)
 Active smoker with main concern cough as of 01/14/22  - 01/14/22 d/c acei > cough resolved then recurred on macrodantin  > resolved again  - 10/08/2022   Walked on RA  x  2  lap(s) =  approx 300  ft  @  back pain slow  pace, stopped due to back pain with lowest 02 sats 97%   - LDSCT  12/01/22 mild centrilobular and paraseptal emphysema  -  PFT's  12/30/22  FEV1 2.62 (75 % ) ratio 0.77  p 0 % improvement from saba p 0 prior to study with DLCO  18.11 (69%)   and FV curve nl  and ERV 51% at wt 194     Acute bronchitis already rx with approp abx   >>> depomedrol 120 mg IM   >>> muicnex dm 1200 mg bid and flutter valve   >>> continue gerd rx   >>> just use neb saba prn, avoid inhalers given severity of coughing fits and unable to use narcotics for cough

## 2023-11-28 NOTE — Addendum Note (Signed)
 Addended by: Nirvan Laban M on: 11/28/2023 02:55 PM   Modules accepted: Orders

## 2023-11-28 NOTE — Telephone Encounter (Signed)
 FYI Only or Action Required?: FYI only for provider: appointment scheduled on 11/17.  Patient is followed in Pulmonology for COPD, last seen on 06/15/2023 by Darlean Ozell NOVAK, MD.  Called Nurse Triage reporting Cough.  Symptoms began about a month ago.  Interventions attempted: Rescue inhaler and Nebulizer treatments.  Symptoms are: gradually worsening.  Triage Disposition: See Physician Within 24 Hours  Patient/caregiver understands and will follow disposition?: yes  E2C2 Pulmonary Triage - Initial Assessment Questions "Chief Complaint (e.g., cough, sob, wheezing, fever, chills, sweat or additional symptoms) *Go to specific symptom protocol after initial questions. Cough- chest pain  "How long have symptoms been present?" 10/24  Have you tested for COVID or Flu? Note: If not, ask patient if a home test can be taken. If so, instruct patient to call back for positive results. Yes- all negative  MEDICINES:   "Have you used any OTC meds to help with symptoms?" Yes If yes, ask "What medications?" Ibuprofen  600mg   "Have you used your inhalers/maintenance medication?" no If yes, "What medications?" na  If inhaler, ask "How many puffs and how often?" Note: Review instructions on medication in the chart. Albuterol - twice daily Nebulizer- 2.5 ml Albuterol - 3 times/day  OXYGEN : "Do you wear supplemental oxygen ?" No If yes, "How many liters are you supposed to use?" na  "Do you monitor your oxygen  levels?" No If yes, What is your reading (oxygen  level) today? na  What is your usual oxygen  saturation reading?  (Note: Pulmonary O2 sats should be 90% or greater) na    Reason for Disposition  Coughing up rusty-colored (reddish-brown) sputum  Answer Assessment - Initial Assessment Questions 1. ONSET: When did the cough begin?      10/24- patient has been sen at ED, PCP 2. SEVERITY: How bad is the cough today?      Unable to lay down 3. SPUTUM: Describe the color of  your sputum (e.g., none, dry cough; clear, white, yellow, green)     Brown thick green phlegm  4. HEMOPTYSIS: Are you coughing up any blood? If Yes, ask: How much? (e.g., flecks, streaks, tablespoons, etc.)     no 5. DIFFICULTY BREATHING: Are you having difficulty breathing? If Yes, ask: How bad is it? (e.g., mild, moderate, severe)      Pain with deep breath or cough- severe 6. FEVER: Do you have a fever? If Yes, ask: What is your temperature, how was it measured, and when did it start?     no 7. CARDIAC HISTORY: Do you have any history of heart disease? (e.g., heart attack, congestive heart failure)      CIC hx 8. LUNG HISTORY: Do you have any history of lung disease?  (e.g., pulmonary embolus, asthma, emphysema)     Hx pneumonia -10/29 9. PE RISK FACTORS: Do you have a history of blood clots? (or: recent major surgery, recent prolonged travel, bedridden)     no 10. OTHER SYMPTOMS: Do you have any other symptoms? (e.g., runny nose, wheezing, chest pain)       Chest pain- with cough  Protocols used: Cough - Acute Productive-A-AH

## 2023-12-05 ENCOUNTER — Ambulatory Visit (INDEPENDENT_AMBULATORY_CARE_PROVIDER_SITE_OTHER): Admitting: Gastroenterology

## 2023-12-06 ENCOUNTER — Encounter: Payer: Self-pay | Admitting: Family Medicine

## 2023-12-07 ENCOUNTER — Telehealth: Payer: Self-pay

## 2023-12-07 NOTE — Telephone Encounter (Signed)
 Copied from CRM #[id number]. Topic: Referral - Question >> Dec 07, 2023  8:47 AM Leila BROCKS wrote: Reason for CRM: Patient [phone] states saw Dr. Darlean 11/28/23 and was referred to orthopedics. The orthopedics office called and was going to check with this, but never called patient back. Patient does not have the orthopedics information and phone number. Please advise and call back.    Patient states is losing control on her hands when patient is trying to do certain things, thinking it's nerves issues about a week ago and contact pcp about this. Pcp advised patient to the ED since patient had history of stroke. Patient states has chronic back pain recovery drug addiction, currently on medication from pcp. Patient denies shortness of pain,   Please advise but looks like her pcp is handling this

## 2023-12-12 ENCOUNTER — Encounter (INDEPENDENT_AMBULATORY_CARE_PROVIDER_SITE_OTHER): Admitting: Gastroenterology

## 2023-12-13 ENCOUNTER — Ambulatory Visit (INDEPENDENT_AMBULATORY_CARE_PROVIDER_SITE_OTHER): Admitting: Gastroenterology

## 2023-12-13 ENCOUNTER — Telehealth (INDEPENDENT_AMBULATORY_CARE_PROVIDER_SITE_OTHER): Payer: Self-pay | Admitting: Gastroenterology

## 2023-12-13 ENCOUNTER — Other Ambulatory Visit: Payer: Self-pay

## 2023-12-13 ENCOUNTER — Encounter (INDEPENDENT_AMBULATORY_CARE_PROVIDER_SITE_OTHER): Payer: Self-pay | Admitting: Gastroenterology

## 2023-12-13 VITALS — BP 131/80 | HR 74 | Temp 98.2°F | Ht 72.0 in | Wt 199.5 lb

## 2023-12-13 DIAGNOSIS — R1011 Right upper quadrant pain: Secondary | ICD-10-CM

## 2023-12-13 DIAGNOSIS — K5904 Chronic idiopathic constipation: Secondary | ICD-10-CM | POA: Diagnosis not present

## 2023-12-13 DIAGNOSIS — K219 Gastro-esophageal reflux disease without esophagitis: Secondary | ICD-10-CM

## 2023-12-13 DIAGNOSIS — R1319 Other dysphagia: Secondary | ICD-10-CM | POA: Diagnosis not present

## 2023-12-13 DIAGNOSIS — S22060A Wedge compression fracture of T7-T8 vertebra, initial encounter for closed fracture: Secondary | ICD-10-CM

## 2023-12-13 DIAGNOSIS — K838 Other specified diseases of biliary tract: Secondary | ICD-10-CM | POA: Diagnosis not present

## 2023-12-13 DIAGNOSIS — K8050 Calculus of bile duct without cholangitis or cholecystitis without obstruction: Secondary | ICD-10-CM

## 2023-12-13 DIAGNOSIS — K581 Irritable bowel syndrome with constipation: Secondary | ICD-10-CM | POA: Insufficient documentation

## 2023-12-13 MED ORDER — LINACLOTIDE 145 MCG PO CAPS: 145.0000 ug | ORAL_CAPSULE | Freq: Every day | ORAL | 1 refills | Status: DC

## 2023-12-13 MED ORDER — LINACLOTIDE 145 MCG PO CAPS: 145.0000 ug | ORAL_CAPSULE | Freq: Every day | ORAL | Status: DC

## 2023-12-13 NOTE — Telephone Encounter (Signed)
 Medication Samples have been provided to the patient.  Drug name: Linzess       Strength: 145 mcg        Qty: 3 boxes   LOT: [id number]  Exp.Date: 03/26  Dosing instructions: take one capsule po daily  The patient has been instructed regarding the correct time, dose, and frequency of taking this medication, including desired effects and most common side effects.   Glenys Bruns 4:10 PM 12/13/2023

## 2023-12-13 NOTE — Patient Instructions (Signed)
 It was very nice to meet you today, as dicussed with will plan for the following :  1) MRI/MRCP  2) Linzess  Linzess works best when taken once a day every day, on an empty stomach, at least 30 minutes before your first meal of the day.  When Linzess is taken daily as directed:  *Constipation relief is typically felt in about a week *IBS-C patients may begin to experience relief from belly pain and overall abdominal symptoms (pain, discomfort, and bloating) in about 1 week,   with symptoms typically improving over 12 weeks.  Diarrhea, nausea or abdominal cramping may occur in the first 2 weeks -keep taking it.  These symptoms should eventually resolve. Please notify us  if having more than 4 watery bowel movements per day or fecal soiling accidents.

## 2023-12-13 NOTE — Addendum Note (Signed)
 Addended by: Bennette Hasty on: 12/13/2023 04:09 PM   Modules accepted: Orders
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
## 2023-12-15 ENCOUNTER — Telehealth (INDEPENDENT_AMBULATORY_CARE_PROVIDER_SITE_OTHER): Payer: Self-pay

## 2023-12-15 NOTE — Telephone Encounter (Signed)
 Prior Auth is not required for MRI with Medicaid of 

## 2023-12-15 NOTE — Telephone Encounter (Signed)
 Spoke with patient, scheduled MRI for 12/22/2023 at 8:45am. Patient aware and verbalized understanding.

## 2023-12-16 ENCOUNTER — Other Ambulatory Visit: Payer: Self-pay | Admitting: Family Medicine

## 2023-12-16 ENCOUNTER — Ambulatory Visit (INDEPENDENT_AMBULATORY_CARE_PROVIDER_SITE_OTHER): Admitting: Gastroenterology

## 2023-12-16 DIAGNOSIS — I10 Essential (primary) hypertension: Secondary | ICD-10-CM

## 2023-12-20 ENCOUNTER — Other Ambulatory Visit: Payer: Self-pay

## 2023-12-20 ENCOUNTER — Ambulatory Visit (INDEPENDENT_AMBULATORY_CARE_PROVIDER_SITE_OTHER): Admitting: Gastroenterology

## 2023-12-20 DIAGNOSIS — S22060A Wedge compression fracture of T7-T8 vertebra, initial encounter for closed fracture: Secondary | ICD-10-CM

## 2023-12-20 NOTE — Telephone Encounter (Signed)
 See dme referral to ortho *

## 2023-12-20 NOTE — Telephone Encounter (Signed)
 Sent referral to neurosurgeon

## 2023-12-22 ENCOUNTER — Ambulatory Visit (HOSPITAL_COMMUNITY): Admission: RE | Admit: 2023-12-22

## 2023-12-24 ENCOUNTER — Other Ambulatory Visit (INDEPENDENT_AMBULATORY_CARE_PROVIDER_SITE_OTHER): Payer: Self-pay | Admitting: Gastroenterology

## 2023-12-24 ENCOUNTER — Encounter (HOSPITAL_COMMUNITY): Payer: Self-pay

## 2023-12-24 ENCOUNTER — Ambulatory Visit (HOSPITAL_COMMUNITY): Admission: RE | Admit: 2023-12-24 | Discharge: 2023-12-24 | Attending: Gastroenterology

## 2023-12-24 DIAGNOSIS — K838 Other specified diseases of biliary tract: Secondary | ICD-10-CM

## 2023-12-24 DIAGNOSIS — K8050 Calculus of bile duct without cholangitis or cholecystitis without obstruction: Secondary | ICD-10-CM

## 2023-12-24 MED ORDER — GADOBUTROL 1 MMOL/ML IV SOLN: 9.0000 mL | Freq: Once | INTRAVENOUS | Status: DC | PRN

## 2023-12-29 ENCOUNTER — Ambulatory Visit: Admitting: Neuroradiology

## 2023-12-29 VITALS — BP 122/68 | HR 73 | Temp 98.1°F | Ht 72.0 in | Wt 196.0 lb

## 2023-12-29 DIAGNOSIS — G459 Transient cerebral ischemic attack, unspecified: Secondary | ICD-10-CM

## 2023-12-29 DIAGNOSIS — M4854XA Collapsed vertebra, not elsewhere classified, thoracic region, initial encounter for fracture: Secondary | ICD-10-CM | POA: Diagnosis not present

## 2023-12-29 DIAGNOSIS — R29898 Other symptoms and signs involving the musculoskeletal system: Secondary | ICD-10-CM

## 2023-12-29 NOTE — Progress Notes (Signed)
 I had the pleasure of seeing Alice [name] in the office today for evaluation of compression fractures.  She has a history of smoking, past drug use, now sober.  Presumably her osteoporosis is related to this history.  Briefly, she had an episode of bronchitis with painful coughing, which prompted a chest x-ray on 11/18/2023.  This noted thoracic compression fractures.  With the resolution of the bronchitis, she has also had resolution of the back pain she is not complaining of any back pain at present.  I reviewed her lung screening CT from 07/14/2023, and the compression fractures at T7, T9, T10 and L1 noted on that study are the same as the compression fractures on the chest x-ray from 11/18/2023.  On another matter, she had an episode of transient weakness and clumsiness of her right arm while she was in the hospital on 11/09/2023.  A brain MRI was obtained which showed an old infarct in the left parietal lobe and chronic microhemorrhages in the left parietal and right occipital lobes.  There was no acute abnormality  She continues to complain of some clumsiness of her right arm.  I note that she had a carotid ultrasound 07/25/2021 which showed plaque but no stenosis on either side-normal velocities.  She had an echocardiogram 01/17/2022 which showed LVH with preserved ejection fraction  The clumsiness of the right arm seems to be positional and relatively stereotypical.  I think it is more likely not related to stroke.  She has vascular risk factors of hypertension, well-controlled with medication, dyslipidemia, also controlled with medication.  I reviewed her medications which include aspirin .    Assessment:  Chronic compression fractures, no significant pain Episodes of right arm clumsiness.  This does not sound like transient ischemic attacks.   Recommendation:  1.  No vertebroplasty or kyphoplasty indicated 2.  I will check a carotid ultrasound given the episodes of right arm clumsiness.

## 2023-12-31 ENCOUNTER — Ambulatory Visit (HOSPITAL_COMMUNITY)
Admission: RE | Admit: 2023-12-31 | Discharge: 2023-12-31 | Disposition: A | Discharge status: Home / Self Care | Source: Ambulatory Visit | Attending: Gastroenterology | Admitting: Gastroenterology

## 2023-12-31 DIAGNOSIS — K838 Other specified diseases of biliary tract: Secondary | ICD-10-CM | POA: Diagnosis present

## 2023-12-31 DIAGNOSIS — K8050 Calculus of bile duct without cholangitis or cholecystitis without obstruction: Secondary | ICD-10-CM | POA: Insufficient documentation

## 2023-12-31 MED ORDER — GADOBUTROL 1 MMOL/ML IV SOLN
7.5000 mL | Freq: Once | INTRAVENOUS | Status: AC | PRN
  Administered 2023-12-31: 7.5 mL via INTRAVENOUS

## 2024-01-02 ENCOUNTER — Ambulatory Visit (INDEPENDENT_AMBULATORY_CARE_PROVIDER_SITE_OTHER): Payer: Self-pay | Admitting: Gastroenterology

## 2024-01-03 ENCOUNTER — Ambulatory Visit (INDEPENDENT_AMBULATORY_CARE_PROVIDER_SITE_OTHER): Payer: Self-pay | Admitting: Gastroenterology

## 2024-01-03 ENCOUNTER — Other Ambulatory Visit: Payer: Self-pay

## 2024-01-03 ENCOUNTER — Telehealth: Payer: Self-pay

## 2024-01-03 DIAGNOSIS — K8050 Calculus of bile duct without cholangitis or cholecystitis without obstruction: Secondary | ICD-10-CM

## 2024-01-03 NOTE — Telephone Encounter (Signed)
ERCP scheduled, pt instructed and medications reviewed.  Patient instructions mailed to home.  Patient to call with any questions or concerns.  

## 2024-01-03 NOTE — Telephone Encounter (Signed)
 ERCP has been set up for 02/02/24 at 8 am at [facility] with GM   Left message on machine to call back

## 2024-01-03 NOTE — Telephone Encounter (Signed)
-----   Message from Aloha Finner, MD sent at 01/03/2024  1:01 PM EST ----- Regarding: RE: EUS? MFA, I recommend that we go ahead and just move forward with direct ERCP. I can try to get her scheduled on January 22. Thanks. GM  Alice [name], Is a patient whose had previous ERCP with me for biliary sludge/choledocholithiasis with recurring pain and elevated alkaline phosphatase. Direct ERCP for biliary cleanout and ensure no evidence of retained choledocholithiasis after her recent cholecystectomy. See if we can use that open January 22 date at 8 in the morning. Thanks. GM ----- Message ----- From: Cinderella Deatrice FALCON, MD Sent: 01/03/2024  11:53 AM EST To: Aloha Finner Raddle., MD Subject: RE: EUS?                                       Hi Dr Finner   Thank you for your prompt reply as usually  Her symptoms improved slightly after last ERCP and Cholecystectomy but still persist   With the recent abnormal MRCP and continued RUQ pain , she is open to repeat ERCP+/- EUS , I spoke to the patient over the phone today   Thank you ----- Message ----- From: Finner Aloha Raddle., MD Sent: 01/02/2024   5:14 PM EST To: Deatrice FALCON Cinderella, MD Subject: RE: EUS?                                       MFA, Did she do pretty well for a while after the gallbladder came out or is this a persisting pain? I am not opposed to an EUS +/- ERCP or if the pain is present, just a direct ERCP and clear out if she is really having pain reminiscent of her discomfort prior to ERCP. Let me know. Thanks. GM ----- Message ----- From: Cinderella Deatrice FALCON, MD Sent: 01/02/2024   4:04 PM EST To: Aloha Finner Raddle., MD Subject: EUS?                                           Hi Dr CAFFIE  You had performed this patient ERCP last year   Given elevated ALP and abdominal pain , I had ordered MRI/MRCP. I see she never had Intrahepatic biliary dilation before   You think she will benefit from  EUS?   MRI/MRCP  IMPRESSION: 1. Intra and extrahepatic biliary ductal dilatation, common bile duct measuring up to 1.2 cm in caliber and tapering smoothly to the ampulla. No obstructing calculus or other lesion identified to the ampulla. This is most likely benign post cholecystectomy biliary ductal dilatation. 2. No evidence of choledocholithiasis. 3. Diffuse fatty atrophy of the pancreas.

## 2024-01-09 ENCOUNTER — Ambulatory Visit (HOSPITAL_COMMUNITY)
Admission: RE | Admit: 2024-01-09 | Discharge: 2024-01-09 | Disposition: A | Discharge status: Home / Self Care | Source: Ambulatory Visit | Attending: Neuroradiology | Admitting: Neuroradiology

## 2024-01-09 DIAGNOSIS — G459 Transient cerebral ischemic attack, unspecified: Secondary | ICD-10-CM | POA: Insufficient documentation

## 2024-01-16 ENCOUNTER — Encounter: Payer: Self-pay | Admitting: Family Medicine

## 2024-01-16 ENCOUNTER — Telehealth: Payer: Self-pay | Admitting: Neuroradiology

## 2024-01-16 ENCOUNTER — Other Ambulatory Visit: Payer: Self-pay | Admitting: Family Medicine

## 2024-01-16 DIAGNOSIS — L282 Other prurigo: Secondary | ICD-10-CM

## 2024-01-16 NOTE — Telephone Encounter (Signed)
 I called her to follow-up on her right arm symptoms and her back pain.  She had a carotid ultrasound which I reviewed which shows no significant carotid stenosis.  She is on 81 mg aspirin .  She has had no further episodes of the right arm numbness or tingling.  She says her back pain is improving.  I have not scheduled any further follow-up.  I am glad to see her again anytime as needed.

## 2024-01-17 ENCOUNTER — Encounter: Payer: Self-pay | Admitting: Family Medicine

## 2024-01-17 DIAGNOSIS — Z20828 Contact with and (suspected) exposure to other viral communicable diseases: Secondary | ICD-10-CM

## 2024-01-18 ENCOUNTER — Telehealth: Admitting: Neuroradiology

## 2024-01-18 MED ORDER — OSELTAMIVIR PHOSPHATE 75 MG PO CAPS: 75.0000 mg | ORAL_CAPSULE | Freq: Two times a day (BID) | ORAL | 0 refills | Status: DC

## 2024-01-19 ENCOUNTER — Other Ambulatory Visit: Payer: Self-pay | Admitting: Family Medicine

## 2024-01-19 ENCOUNTER — Encounter (HOSPITAL_COMMUNITY): Payer: Self-pay | Admitting: Gastroenterology

## 2024-01-19 DIAGNOSIS — E782 Mixed hyperlipidemia: Secondary | ICD-10-CM

## 2024-01-19 DIAGNOSIS — K219 Gastro-esophageal reflux disease without esophagitis: Secondary | ICD-10-CM

## 2024-01-24 ENCOUNTER — Telehealth: Payer: Self-pay | Admitting: Gastroenterology

## 2024-01-24 ENCOUNTER — Encounter: Payer: Self-pay | Admitting: Family Medicine

## 2024-01-24 ENCOUNTER — Ambulatory Visit: Payer: Self-pay | Admitting: Family Medicine

## 2024-01-24 VITALS — BP 124/74 | HR 69 | Temp 97.3°F | Ht 73.0 in | Wt 201.0 lb

## 2024-01-24 DIAGNOSIS — I10 Essential (primary) hypertension: Secondary | ICD-10-CM

## 2024-01-24 DIAGNOSIS — F321 Major depressive disorder, single episode, moderate: Secondary | ICD-10-CM

## 2024-01-24 DIAGNOSIS — J449 Chronic obstructive pulmonary disease, unspecified: Secondary | ICD-10-CM

## 2024-01-24 DIAGNOSIS — Z1231 Encounter for screening mammogram for malignant neoplasm of breast: Secondary | ICD-10-CM

## 2024-01-24 DIAGNOSIS — E559 Vitamin D deficiency, unspecified: Secondary | ICD-10-CM | POA: Diagnosis not present

## 2024-01-24 DIAGNOSIS — R7303 Prediabetes: Secondary | ICD-10-CM | POA: Diagnosis not present

## 2024-01-24 DIAGNOSIS — E782 Mixed hyperlipidemia: Secondary | ICD-10-CM | POA: Diagnosis not present

## 2024-01-24 LAB — BAYER DCA HB A1C WAIVED: HB A1C (BAYER DCA - WAIVED): 5.9 % — ABNORMAL HIGH (ref 4.8–5.6)

## 2024-01-24 MED ORDER — LOSARTAN POTASSIUM 25 MG PO TABS: 12.5000 mg | ORAL_TABLET | Freq: Every day | ORAL | 1 refills | Status: AC

## 2024-01-24 NOTE — Telephone Encounter (Signed)
 The pt states that she is 100% better from the flu with no symptoms. She will keep appt as planned and call call back with any further complaints or questions.
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
## 2024-01-24 NOTE — Telephone Encounter (Signed)
 Procedure:ERCP Procedure date: 02/02/24 Procedure location: [facility] Arrival Time: 6:30 am Spoke with the patient Y/N: Yes Any prep concerns? No  Has the patient obtained the prep from the pharmacy ? No prep needed Do you have a care partner and transportation: Yes Any additional concerns? Patient stated that she had contracted the Flu and had finished her antibiotics and someone was going to call he back this week to see if it was ok for her to still have her procedure.

## 2024-01-25 LAB — CMP14+EGFR
ALT: 11 IU/L (ref 0–32)
AST: 18 IU/L (ref 0–40)
Albumin: 4.1 g/dL (ref 3.8–4.9)
Alkaline Phosphatase: 98 IU/L (ref 49–135)
BUN/Creatinine Ratio: 15 (ref 9–23)
BUN: 12 mg/dL (ref 6–24)
Bilirubin Total: 0.4 mg/dL (ref 0.0–1.2)
CO2: 23 mmol/L (ref 20–29)
Calcium: 9.5 mg/dL (ref 8.7–10.2)
Chloride: 105 mmol/L (ref 96–106)
Creatinine, Ser: 0.8 mg/dL (ref 0.57–1.00)
Globulin, Total: 2.4 g/dL (ref 1.5–4.5)
Glucose: 95 mg/dL (ref 70–99)
Potassium: 4.6 mmol/L (ref 3.5–5.2)
Sodium: 142 mmol/L (ref 134–144)
Total Protein: 6.5 g/dL (ref 6.0–8.5)
eGFR: 85 mL/min/1.73

## 2024-01-25 LAB — CBC WITH DIFFERENTIAL/PLATELET
Basophils Absolute: 0 x10E3/uL (ref 0.0–0.2)
Basos: 1 %
EOS (ABSOLUTE): 0.1 x10E3/uL (ref 0.0–0.4)
Eos: 1 %
Hematocrit: 37.7 % (ref 34.0–46.6)
Hemoglobin: 12 g/dL (ref 11.1–15.9)
Immature Grans (Abs): 0 x10E3/uL (ref 0.0–0.1)
Immature Granulocytes: 0 %
Lymphocytes Absolute: 1.7 x10E3/uL (ref 0.7–3.1)
Lymphs: 32 %
MCH: 29.6 pg (ref 26.6–33.0)
MCHC: 31.8 g/dL (ref 31.5–35.7)
MCV: 93 fL (ref 79–97)
Monocytes Absolute: 0.4 x10E3/uL (ref 0.1–0.9)
Monocytes: 8 %
Neutrophils Absolute: 3.2 x10E3/uL (ref 1.4–7.0)
Neutrophils: 58 %
Platelets: 215 x10E3/uL (ref 150–450)
RBC: 4.05 x10E6/uL (ref 3.77–5.28)
RDW: 12.9 % (ref 11.7–15.4)
WBC: 5.5 x10E3/uL (ref 3.4–10.8)

## 2024-01-25 LAB — LIPID PANEL
Chol/HDL Ratio: 3.1 ratio (ref 0.0–4.4)
Cholesterol, Total: 113 mg/dL (ref 100–199)
HDL: 37 mg/dL — ABNORMAL LOW
LDL Chol Calc (NIH): 60 mg/dL (ref 0–99)
Triglycerides: 79 mg/dL (ref 0–149)
VLDL Cholesterol Cal: 16 mg/dL (ref 5–40)

## 2024-01-25 LAB — T4, FREE: Free T4: 1.18 ng/dL (ref 0.82–1.77)

## 2024-01-25 LAB — TSH: TSH: 1.98 u[IU]/mL (ref 0.450–4.500)

## 2024-01-25 LAB — VITAMIN D 25 HYDROXY (VIT D DEFICIENCY, FRACTURES): Vit D, 25-Hydroxy: 29 ng/mL — AB (ref 30.0–100.0)

## 2024-02-01 ENCOUNTER — Encounter

## 2024-02-01 NOTE — Anesthesia Preprocedure Evaluation (Signed)
"                                    Anesthesia Evaluation    Reviewed: Allergy & Precautions, H&P , Patient's Chart, lab work & pertinent test results, Unable to perform ROS - Chart review only  Airway        Dental   Pulmonary neg pulmonary ROS, COPD, Current Smoker and Patient abstained from smoking.   Pulmonary exam normal        Cardiovascular Exercise Tolerance: Good hypertension, + Valvular Problems/Murmurs   Echo 01/2022  1. Left ventricular ejection fraction, by estimation, is 60 to 65%. The  left ventricle has normal function. The left ventricle has no regional  wall motion abnormalities. There is severe left ventricular hypertrophy.  Left ventricular diastolic parameters  were normal.   2. Right ventricular systolic function was not well visualized. The right  ventricular size is normal. Tricuspid regurgitation signal is inadequate  for assessing PA pressure.   3. The mitral valve is grossly normal. No evidence of mitral valve  regurgitation. No evidence of mitral stenosis.   4. The aortic valve was not well visualized. Aortic valve regurgitation  is not visualized. No aortic stenosis is present.   5. The inferior vena cava is normal in size with greater than 50%  respiratory variability, suggesting right atrial pressure of 3 mmHg.   Comparison(s): No significant change from prior study.      Neuro/Psych  PSYCHIATRIC DISORDERS Anxiety Depression    CVA negative neurological ROS  negative psych ROS   GI/Hepatic negative GI ROS, Neg liver ROS,GERD  ,,  Endo/Other  negative endocrine ROS    Renal/GU negative Renal ROS  negative genitourinary   Musculoskeletal   Abdominal  (+) + obese  Peds  Hematology negative hematology ROS (+) Blood dyscrasia, anemia   Anesthesia Other Findings   Reproductive/Obstetrics negative OB ROS                              Anesthesia Physical Anesthesia Plan  ASA: 3  Anesthesia Plan:  General and General ETT   Post-op Pain Management: Minimal or no pain anticipated   Induction:   PONV Risk Score and Plan: 3 and Ondansetron , Dexamethasone  and Treatment may vary due to age or medical condition  Airway Management Planned: Oral ETT  Additional Equipment:   Intra-op Plan:   Post-operative Plan: Extubation in OR  Informed Consent:   Plan Discussed with:   Anesthesia Plan Comments:         Anesthesia Quick Evaluation  "

## 2024-02-02 ENCOUNTER — Other Ambulatory Visit: Payer: Self-pay

## 2024-02-02 ENCOUNTER — Encounter (HOSPITAL_COMMUNITY)
Admission: RE | Disposition: A | Discharge status: Home / Self Care | Payer: Self-pay | Source: Home / Self Care | Attending: Gastroenterology

## 2024-02-02 ENCOUNTER — Ambulatory Visit (HOSPITAL_COMMUNITY)
Admission: RE | Admit: 2024-02-02 | Discharge: 2024-02-02 | Disposition: A | Discharge status: Home / Self Care | Attending: Gastroenterology | Admitting: Gastroenterology

## 2024-02-02 ENCOUNTER — Encounter (HOSPITAL_COMMUNITY): Payer: Self-pay | Admitting: Gastroenterology

## 2024-02-02 ENCOUNTER — Encounter (HOSPITAL_COMMUNITY): Payer: Self-pay | Admitting: Anesthesiology

## 2024-02-02 DIAGNOSIS — K838 Other specified diseases of biliary tract: Secondary | ICD-10-CM | POA: Diagnosis present

## 2024-02-02 DIAGNOSIS — K8050 Calculus of bile duct without cholangitis or cholecystitis without obstruction: Secondary | ICD-10-CM | POA: Diagnosis present

## 2024-02-02 DIAGNOSIS — Z5309 Procedure and treatment not carried out because of other contraindication: Secondary | ICD-10-CM | POA: Insufficient documentation

## 2024-02-02 MED ORDER — SODIUM CHLORIDE 0.9 % IV SOLN: INTRAVENOUS | Status: DC

## 2024-02-02 NOTE — H&P (Addendum)
 Case discussed with Anesthesia and cancelled due to issues of continued congestion without cough.  Increased risk for post extubation anesthesia complications. Patient not having overt jaundice at this time. Patient will be rescheduled for next Wednesday at [facility] at 9 AM for outpatient ERCP.   Aloha Finner, MD [facility] Office # [phone]

## 2024-02-03 ENCOUNTER — Telehealth: Admitting: Physician Assistant

## 2024-02-03 DIAGNOSIS — J019 Acute sinusitis, unspecified: Secondary | ICD-10-CM | POA: Diagnosis not present

## 2024-02-03 DIAGNOSIS — B9789 Other viral agents as the cause of diseases classified elsewhere: Secondary | ICD-10-CM | POA: Diagnosis not present

## 2024-02-03 MED ORDER — OLOPATADINE HCL 0.1 % OP SOLN: 1.0000 [drp] | Freq: Two times a day (BID) | OPHTHALMIC | 0 refills | Status: AC

## 2024-02-03 MED ORDER — IPRATROPIUM BROMIDE 0.03 % NA SOLN: 2.0000 | Freq: Two times a day (BID) | NASAL | 0 refills | Status: AC

## 2024-02-03 NOTE — Progress Notes (Signed)
 " Virtual Visit Consent   Alice [name], you are scheduled for a virtual visit with a [facility] provider today. Just as with appointments in the office, your consent must be obtained to participate. Your consent will be active for this visit and any virtual visit you may have with one of our providers in the next 365 days. If you have a MyChart account, a copy of this consent can be sent to you electronically.  As this is a virtual visit, video technology does not allow for your provider to perform a traditional examination. This may limit your provider's ability to fully assess your condition. If your provider identifies any concerns that need to be evaluated in person or the need to arrange testing (such as labs, EKG, etc.), we will make arrangements to do so. Although advances in technology are sophisticated, we cannot ensure that it will always work on either your end or our end. If the connection with a video visit is poor, the visit may have to be switched to a telephone visit. With either a video or telephone visit, we are not always able to ensure that we have a secure connection.  By engaging in this virtual visit, you consent to the provision of healthcare and authorize for your insurance to be billed (if applicable) for the services provided during this visit. Depending on your insurance coverage, you may receive a charge related to this service.  I need to obtain your verbal consent now. Are you willing to proceed with your visit today? Alice [name] has provided verbal consent on 02/03/2024 for a virtual visit (video or telephone). Alice [name], [place]  Date: 02/03/2024 8:56 AM   Virtual Visit via Video Note   I, Alice [name], connected with  Alice [name]  ([id number], 1965/11/17) on 02/03/24 at  9:00 AM EST by a video-enabled telemedicine application and verified that I am speaking with the correct person using two identifiers.  Location: Patient: Virtual Visit Location  Patient: Home Provider: Virtual Visit Location Provider: Home Office   I discussed the limitations of evaluation and management by telemedicine and the availability of in person appointments. The patient expressed understanding and agreed to proceed.    History of Present Illness: Alice [name] is a 59 y.o. who identifies as a female who was assigned female at birth, and is being seen today for nasal congestion, sinus pressure and post-nasal drip over the past 2 days. Was supposed to have ERCP yesterday but due to symptoms was postponed by the anesthesiologist. Now with eye irritation and watering with soreness. Denies fever, chills. Denies cough or chest congestion. Denies recent travel or known sick contact.   OTC -- Coricidin HBP.   HPI: HPI  Problems:  Patient Active Problem List   Diagnosis Date Noted   Irritable bowel syndrome with constipation 12/13/2023   Common bile duct dilation 12/13/2023   Compression fracture of T7 vertebra (HCC) 11/28/2023   Rhinitis, chronic 11/28/2023   Calculus of gallbladder without cholecystitis without obstruction 04/05/2023   Esophageal dysphagia 12/08/2022   Chronic idiopathic constipation 12/08/2022   Colon cancer screening 12/08/2022   Gastritis and gastroduodenitis 10/25/2022   Choledocholithiasis 10/25/2022   H/O insertion of pancreatic stent 10/25/2022   Prediabetes 06/15/2022   Constipation due to opioid therapy 03/12/2022   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 01/15/2022   GERD (gastroesophageal reflux disease) 07/24/2021   Depression, major, single episode, moderate (HCC) 10/22/2020   GAD (  generalized anxiety disorder) 11/01/2018   Vitamin D  deficiency 11/01/2018   H/o Prior ischemic stroke (HCC) 09/03/2016   History of stroke 05/09/2016   History of substance abuse (HCC) 05/09/2016   Heart murmur 01/08/2016   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   Dyslipidemia 10/04/2015   HTN (hypertension) 10/04/2015   Discitis  10/04/2015   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/08/2015   Postlaminectomy syndrome, lumbar region 12/04/2012   Anemia 06/17/2012   Chronic low back pain 06/17/2012   DDD (degenerative disc disease), lumbosacral 06/17/2012   Sacroiliitis 06/17/2012   Precordial pain 02/18/2010   Cigarette smoker 12/05/2009   Anxiety state 12/05/2009   Palpitations 12/05/2009    Allergies: Allergies[1] Medications: Current Medications[2]  Observations/Objective: Patient is well-developed, well-nourished in no acute distress.  Resting comfortably at home.  Head is normocephalic, atraumatic.  No labored breathing. Speech is clear and coherent with logical content.  Patient is alert and oriented at baseline.   Assessment and Plan: 1. Acute viral sinusitis (Primary) - ipratropium (ATROVENT ) 0.03 % nasal spray; Place 2 sprays into both nostrils every 12 (twelve) hours.  Dispense: 30 mL; Refill: 0 - olopatadine (PATANOL) 0.1 % ophthalmic solution; Place 1 drop into both eyes 2 (two) times daily.  Dispense: 5 mL; Refill: 0  2 days of symptoms. Afebrile. Supportive measures and OTC medications reviewed. Atrovent  and Patanol per orders. In-person follow-up precautions reviewed.  Follow Up Instructions: I discussed the assessment and treatment plan with the patient. The patient was provided an opportunity to ask questions and all were answered. The patient agreed with the plan and demonstrated an understanding of the instructions.  A copy of instructions were sent to the patient via MyChart unless otherwise noted below.   The patient was advised to call back or seek an in-person evaluation if the symptoms worsen or if the condition fails to improve as anticipated.    Alice Velma Lunger, PA-C    [1]  Allergies Allergen Reactions   Benadryl [Diphenhydramine Hcl] Hives   Codeine Rash   Tetracycline Itching and Rash  [2]  Current Outpatient Medications:     ipratropium (ATROVENT ) 0.03 % nasal spray, Place 2 sprays into both nostrils every 12 (twelve) hours., Disp: 30 mL, Rfl: 0   olopatadine (PATANOL) 0.1 % ophthalmic solution, Place 1 drop into both eyes 2 (two) times daily., Disp: 5 mL, Rfl: 0   albuterol  (PROVENTIL ) (2.5 MG/3ML) 0.083% nebulizer solution, Take 3 mLs (2.5 mg total) by nebulization every 6 (six) hours as needed for wheezing or shortness of breath., Disp: 150 mL, Rfl: 1   albuterol  (VENTOLIN  HFA) 108 (90 Base) MCG/ACT inhaler, Inhale 2 puffs into the lungs every 6 (six) hours as needed for wheezing or shortness of breath., Disp: 8 g, Rfl: 0   aspirin  EC 81 MG tablet, Take 1 tablet (81 mg total) by mouth daily with breakfast. For Stroke Prevention, Disp: 30 tablet, Rfl: 11   atorvastatin  (LIPITOR) 40 MG tablet, TAKE 1 TABLET BY MOUTH EVERY DAY, Disp: 90 tablet, Rfl: 0   baclofen  (LIORESAL ) 10 MG tablet, Take 1 tablet (10 mg total) by mouth 2 (two) times daily., Disp: 60 tablet, Rfl: 3   buprenorphine -naloxone  (SUBOXONE ) 8-2 mg SUBL SL tablet, Place under the tongue., Disp: , Rfl:    buprenorphine -naloxone  (SUBOXONE ) 8-2 mg SUBL SL tablet, Place under the tongue., Disp: , Rfl:    famotidine  (PEPCID ) 20 MG tablet, TAKE 1 TABLET BY MOUTH EVERY DAY AFTER SUPPER, Disp:  30 tablet, Rfl: 11   gabapentin  (NEURONTIN ) 100 MG capsule, Take 1 capsule (100 mg total) by mouth 4 (four) times daily. (Patient taking differently: Take 100 mg by mouth 2 (two) times daily.), Disp: 120 capsule, Rfl: 2   ketoconazole  (NIZORAL ) 2 % cream, APPLY 1 APPLICATION TOPICALLY DAILY, Disp: 30 g, Rfl: 2   losartan  (COZAAR ) 25 MG tablet, Take 0.5 tablets (12.5 mg total) by mouth daily. (Patient taking differently: Take 25 mg by mouth daily.), Disp: 90 tablet, Rfl: 1   pantoprazole  (PROTONIX ) 40 MG tablet, TAKE 1 TABLET BY MOUTH EVERY DAY, Disp: 90 tablet, Rfl: 0  "

## 2024-02-03 NOTE — Patient Instructions (Addendum)
 " Alice [name], thank you for joining Alice Velma Lunger, PA-C for today's virtual visit.  While this provider is not your primary care provider (PCP), if your PCP is located in our provider database this encounter information will be shared with them immediately following your visit.   A [name] account gives you access to today's visit and all your visits, tests, and labs performed at [name]  click here if you don't have a [name] account or go to [email or web address]  Consent: (Patient) Alice [name] provided verbal consent for this virtual visit at the beginning of the encounter.  Current Medications:  Current Outpatient Medications:    albuterol  (PROVENTIL ) (2.5 MG/3ML) 0.083% nebulizer solution, Take 3 mLs (2.5 mg total) by nebulization every 6 (six) hours as needed for wheezing or shortness of breath., Disp: 150 mL, Rfl: 1   albuterol  (VENTOLIN  HFA) 108 (90 Base) MCG/ACT inhaler, Inhale 2 puffs into the lungs every 6 (six) hours as needed for wheezing or shortness of breath., Disp: 8 g, Rfl: 0   aspirin  EC 81 MG tablet, Take 1 tablet (81 mg total) by mouth daily with breakfast. For Stroke Prevention, Disp: 30 tablet, Rfl: 11   atorvastatin  (LIPITOR) 40 MG tablet, TAKE 1 TABLET BY MOUTH EVERY DAY, Disp: 90 tablet, Rfl: 0   baclofen  (LIORESAL ) 10 MG tablet, Take 1 tablet (10 mg total) by mouth 2 (two) times daily., Disp: 60 tablet, Rfl: 3   buprenorphine -naloxone  (SUBOXONE ) 8-2 mg SUBL SL tablet, Place under the tongue., Disp: , Rfl:    buprenorphine -naloxone  (SUBOXONE ) 8-2 mg SUBL SL tablet, Place under the tongue., Disp: , Rfl:    famotidine  (PEPCID ) 20 MG tablet, TAKE 1 TABLET BY MOUTH EVERY DAY AFTER SUPPER, Disp: 30 tablet, Rfl: 11   fluticasone  (FLONASE ) 50 MCG/ACT nasal spray, Place 2 sprays into both nostrils daily., Disp: 16 g, Rfl: 0   gabapentin  (NEURONTIN ) 100 MG capsule, Take 1 capsule (100 mg total) by mouth 4 (four) times daily.  (Patient taking differently: Take 100 mg by mouth 2 (two) times daily.), Disp: 120 capsule, Rfl: 2   ketoconazole  (NIZORAL ) 2 % cream, APPLY 1 APPLICATION TOPICALLY DAILY, Disp: 30 g, Rfl: 2   losartan  (COZAAR ) 25 MG tablet, Take 0.5 tablets (12.5 mg total) by mouth daily. (Patient taking differently: Take 25 mg by mouth daily.), Disp: 90 tablet, Rfl: 1   pantoprazole  (PROTONIX ) 40 MG tablet, TAKE 1 TABLET BY MOUTH EVERY DAY, Disp: 90 tablet, Rfl: 0   Medications ordered in this encounter:  No orders of the defined types were placed in this encounter.    *If you need refills on other medications prior to your next appointment, please contact your pharmacy*  Follow-Up: Call back or seek an in-person evaluation if the symptoms worsen or if the condition fails to improve as anticipated.  [name] [phone]  Other Instructions Please hydrate and rest.  Start a saline nasal rinse. Use the prescription nasal spray and eye drops as directed. Ok to take an OTC Claritin for congestion. Delsym for cough if needed. If you note any non-resolving, new, or worsening symptoms despite treatment, please seek an in-person evaluation ASAP.    If you have been instructed to have an in-person evaluation today at a local Urgent Care facility, please use the link below. It will take you to a list of all of our available  Urgent Cares, including address, phone number and hours of operation. Please do  not delay care.  [facility]  If you or a family member do not have a primary care provider, use the link below to schedule a visit and establish care. When you choose a [facility] primary care physician or advanced practice provider, you gain a long-term partner in health. Find a Primary Care Provider  Learn more about [facility] in-office and virtual care options: [facility] - Get Care Now  "

## 2024-02-08 ENCOUNTER — Encounter (HOSPITAL_COMMUNITY): Payer: Self-pay | Admitting: Anesthesiology

## 2024-02-08 ENCOUNTER — Telehealth: Payer: Self-pay

## 2024-02-08 ENCOUNTER — Encounter (HOSPITAL_COMMUNITY)
Admission: RE | Disposition: A | Discharge status: Home / Self Care | Payer: Self-pay | Source: Ambulatory Visit | Attending: Gastroenterology

## 2024-02-08 ENCOUNTER — Other Ambulatory Visit: Payer: Self-pay

## 2024-02-08 ENCOUNTER — Encounter (HOSPITAL_COMMUNITY): Payer: Self-pay | Admitting: Gastroenterology

## 2024-02-08 ENCOUNTER — Ambulatory Visit (HOSPITAL_COMMUNITY)
Admission: RE | Admit: 2024-02-08 | Discharge: 2024-02-08 | Disposition: A | Discharge status: Home / Self Care | Source: Ambulatory Visit | Attending: Gastroenterology | Admitting: Gastroenterology

## 2024-02-08 ENCOUNTER — Ambulatory Visit (HOSPITAL_COMMUNITY)

## 2024-02-08 DIAGNOSIS — K8050 Calculus of bile duct without cholangitis or cholecystitis without obstruction: Secondary | ICD-10-CM

## 2024-02-08 DIAGNOSIS — R932 Abnormal findings on diagnostic imaging of liver and biliary tract: Secondary | ICD-10-CM | POA: Insufficient documentation

## 2024-02-08 DIAGNOSIS — F32A Depression, unspecified: Secondary | ICD-10-CM | POA: Diagnosis not present

## 2024-02-08 DIAGNOSIS — J449 Chronic obstructive pulmonary disease, unspecified: Secondary | ICD-10-CM | POA: Insufficient documentation

## 2024-02-08 DIAGNOSIS — K219 Gastro-esophageal reflux disease without esophagitis: Secondary | ICD-10-CM | POA: Diagnosis not present

## 2024-02-08 DIAGNOSIS — I10 Essential (primary) hypertension: Secondary | ICD-10-CM | POA: Diagnosis not present

## 2024-02-08 DIAGNOSIS — Z8673 Personal history of transient ischemic attack (TIA), and cerebral infarction without residual deficits: Secondary | ICD-10-CM | POA: Diagnosis not present

## 2024-02-08 DIAGNOSIS — Z9049 Acquired absence of other specified parts of digestive tract: Secondary | ICD-10-CM | POA: Diagnosis not present

## 2024-02-08 DIAGNOSIS — F418 Other specified anxiety disorders: Secondary | ICD-10-CM

## 2024-02-08 DIAGNOSIS — K838 Other specified diseases of biliary tract: Secondary | ICD-10-CM | POA: Diagnosis not present

## 2024-02-08 DIAGNOSIS — R748 Abnormal levels of other serum enzymes: Secondary | ICD-10-CM | POA: Insufficient documentation

## 2024-02-08 DIAGNOSIS — F17210 Nicotine dependence, cigarettes, uncomplicated: Secondary | ICD-10-CM

## 2024-02-08 DIAGNOSIS — Z79899 Other long term (current) drug therapy: Secondary | ICD-10-CM | POA: Insufficient documentation

## 2024-02-08 DIAGNOSIS — Z9889 Other specified postprocedural states: Secondary | ICD-10-CM | POA: Insufficient documentation

## 2024-02-08 DIAGNOSIS — F419 Anxiety disorder, unspecified: Secondary | ICD-10-CM | POA: Insufficient documentation

## 2024-02-08 MED ORDER — FENTANYL CITRATE (PF) 100 MCG/2ML IJ SOLN
INTRAMUSCULAR | Status: AC
  Filled 2024-02-08: qty 2

## 2024-02-08 MED ORDER — DICLOFENAC SUPPOSITORY 100 MG
RECTAL | Status: DC | PRN
  Administered 2024-02-08: 100 mg via RECTAL

## 2024-02-08 MED ORDER — SODIUM CHLORIDE 0.9 % IV SOLN
1.5000 g | Freq: Once | INTRAVENOUS | Status: AC
  Administered 2024-02-08: 1.5 g via INTRAVENOUS
  Filled 2024-02-08: qty 4

## 2024-02-08 MED ORDER — GLUCAGON HCL RDNA (DIAGNOSTIC) 1 MG IJ SOLR
INTRAMUSCULAR | Status: AC
  Filled 2024-02-08: qty 2

## 2024-02-08 MED ORDER — LIDOCAINE 2% (20 MG/ML) 5 ML SYRINGE
INTRAMUSCULAR | Status: DC | PRN
  Administered 2024-02-08: 100 mg via INTRAVENOUS

## 2024-02-08 MED ORDER — SODIUM CHLORIDE 0.9 % IV SOLN: INTRAVENOUS | Status: DC

## 2024-02-08 MED ORDER — CIPROFLOXACIN IN D5W 400 MG/200ML IV SOLN
INTRAVENOUS | Status: AC
  Filled 2024-02-08: qty 200

## 2024-02-08 MED ORDER — MIDAZOLAM HCL 2 MG/2ML IJ SOLN
INTRAMUSCULAR | Status: AC
  Filled 2024-02-08: qty 2

## 2024-02-08 MED ORDER — FENTANYL CITRATE (PF) 100 MCG/2ML IJ SOLN
INTRAMUSCULAR | Status: DC | PRN
  Administered 2024-02-08 (×2): 50 ug via INTRAVENOUS

## 2024-02-08 MED ORDER — PROPOFOL 10 MG/ML IV BOLUS
INTRAVENOUS | Status: DC | PRN
  Administered 2024-02-08: 160 mg via INTRAVENOUS

## 2024-02-08 MED ORDER — ROCURONIUM BROMIDE 10 MG/ML (PF) SYRINGE
PREFILLED_SYRINGE | INTRAVENOUS | Status: DC | PRN
  Administered 2024-02-08: 10 mg via INTRAVENOUS
  Administered 2024-02-08: 40 mg via INTRAVENOUS

## 2024-02-08 MED ORDER — MIDAZOLAM HCL 5 MG/5ML IJ SOLN
INTRAMUSCULAR | Status: DC | PRN
  Administered 2024-02-08: 2 mg via INTRAVENOUS

## 2024-02-08 MED ORDER — DEXAMETHASONE SOD PHOSPHATE PF 10 MG/ML IJ SOLN
INTRAMUSCULAR | Status: DC | PRN
  Administered 2024-02-08: 5 mg via INTRAVENOUS

## 2024-02-08 MED ORDER — DICLOFENAC SUPPOSITORY 100 MG
RECTAL | Status: AC
  Filled 2024-02-08: qty 1

## 2024-02-08 MED ORDER — SODIUM CHLORIDE 0.9 % IV SOLN
INTRAVENOUS | Status: DC | PRN
  Administered 2024-02-08: 30 mL

## 2024-02-08 MED ORDER — LACTATED RINGERS IV SOLN
INTRAVENOUS | Status: AC | PRN
  Administered 2024-02-08: 1000 mL via INTRAVENOUS

## 2024-02-08 MED ORDER — ONDANSETRON HCL 4 MG/2ML IJ SOLN
INTRAMUSCULAR | Status: DC | PRN
  Administered 2024-02-08: 4 mg via INTRAVENOUS

## 2024-02-08 MED ORDER — PHENYLEPHRINE 80 MCG/ML (10ML) SYRINGE FOR IV PUSH (FOR BLOOD PRESSURE SUPPORT)
PREFILLED_SYRINGE | INTRAVENOUS | Status: DC | PRN
  Administered 2024-02-08 (×2): 160 ug via INTRAVENOUS

## 2024-02-08 MED ORDER — SUGAMMADEX SODIUM 200 MG/2ML IV SOLN
INTRAVENOUS | Status: DC | PRN
  Administered 2024-02-08: 200 mg via INTRAVENOUS

## 2024-02-08 NOTE — Anesthesia Preprocedure Evaluation (Signed)
 "                                  Anesthesia Evaluation  Patient identified by MRN, date of birth, ID band Patient awake    Reviewed: Allergy & Precautions, H&P , NPO status , Patient's Chart, lab work & pertinent test results, Unable to perform ROS - Chart review only  History of Anesthesia Complications Negative for: history of anesthetic complications  Airway Mallampati: II  TM Distance: >3 FB Neck ROM: Full    Dental  (+) Upper Dentures   Pulmonary neg sleep apnea, COPD, neg recent URI, Current Smoker and Patient abstained from smoking.   Pulmonary exam normal breath sounds clear to auscultation       Cardiovascular Exercise Tolerance: Good METShypertension, Pt. on medications (-) CAD and (-) Past MI (-) dysrhythmias + Valvular Problems/Murmurs  Rhythm:Regular Rate:Normal - Systolic murmurs Echo 01/2022  1. Left ventricular ejection fraction, by estimation, is 60 to 65%. The  left ventricle has normal function. The left ventricle has no regional  wall motion abnormalities. There is severe left ventricular hypertrophy.  Left ventricular diastolic parameters  were normal.   2. Right ventricular systolic function was not well visualized. The right  ventricular size is normal. Tricuspid regurgitation signal is inadequate  for assessing PA pressure.   3. The mitral valve is grossly normal. No evidence of mitral valve  regurgitation. No evidence of mitral stenosis.   4. The aortic valve was not well visualized. Aortic valve regurgitation  is not visualized. No aortic stenosis is present.   5. The inferior vena cava is normal in size with greater than 50%  respiratory variability, suggesting right atrial pressure of 3 mmHg.   Comparison(s): No significant change from prior study.      Neuro/Psych  PSYCHIATRIC DISORDERS Anxiety Depression    CVA    GI/Hepatic ,GERD  ,,Hx substance use on subutex    Endo/Other  negative endocrine ROSneg diabetes     Renal/GU negative Renal ROS  negative genitourinary   Musculoskeletal   Abdominal   Peds  Hematology negative hematology ROS (+) Blood dyscrasia, anemia   Anesthesia Other Findings Past Medical History: 12/05/2009: ANEMIA     Comment:  Qualifier: Diagnosis of  By: Delbra Krebs   No date: Chronic low back pain No date: Cocaine abuse (HCC) No date: COPD (chronic obstructive pulmonary disease) (HCC) No date: Heart murmur No date: HLD (hyperlipidemia) No date: Hypertension No date: Stroke [facility])     Comment:  x4  Reproductive/Obstetrics negative OB ROS                              Anesthesia Physical Anesthesia Plan  ASA: 3  Anesthesia Plan: General   Post-op Pain Management: Minimal or no pain anticipated   Induction: Intravenous  PONV Risk Score and Plan: 3 and Ondansetron , Dexamethasone  and Treatment may vary due to age or medical condition  Airway Management Planned: Oral ETT  Additional Equipment: None  Intra-op Plan:   Post-operative Plan: Extubation in OR  Informed Consent: I have reviewed the patients History and Physical, chart, labs and discussed the procedure including the risks, benefits and alternatives for the proposed anesthesia with the patient or authorized representative who has indicated his/her understanding and acceptance.     Dental advisory given  Plan Discussed with: CRNA and Surgeon  Anesthesia Plan  Comments: (Discussed risks of anesthesia with patient, including PONV, sore throat, lip/dental/eye damage. Rare risks discussed as well, such as cardiorespiratory and neurological sequelae, and allergic reactions. Discussed the role of CRNA in patient's perioperative care. Patient understands. Patient was cancelled for this procedure recently due to NPO violation and having nasal congestion in setting of someone in her house having influenza. Patient says she never developed any URI symptoms. She is still congested  todaybut she attributes this to her crying today (She is nervous about anesthesia.) )        Anesthesia Quick Evaluation  "
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
## 2024-02-08 NOTE — Discharge Instructions (Signed)

## 2024-02-08 NOTE — Anesthesia Procedure Notes (Signed)
 Procedure Name: Intubation Date/Time: 02/08/2024 9:03 AM  Performed by: Franchot Delon RAMAN, CRNAPre-anesthesia Checklist: Patient identified, Emergency Drugs available, Suction available and Patient being monitored Patient Re-evaluated:Patient Re-evaluated prior to induction Oxygen  Delivery Method: Circle System Utilized Preoxygenation: Pre-oxygenation with 100% oxygen  Induction Type: IV induction Ventilation: Mask ventilation without difficulty Laryngoscope Size: Mac and 3 Grade View: Grade I Tube type: Oral Tube size: 7.0 mm Number of attempts: 1 Airway Equipment and Method: Stylet, Oral airway and Bite block Placement Confirmation: ETT inserted through vocal cords under direct vision, positive ETCO2 and breath sounds checked- equal and bilateral Secured at: 22 cm Tube secured with: Tape Dental Injury: Teeth and Oropharynx as per pre-operative assessment

## 2024-02-08 NOTE — Anesthesia Postprocedure Evaluation (Signed)
"   Anesthesia Post Note  Patient: Alice [name]  Procedure(s) Performed: ERCP, WITH INTERVENTION IF INDICATED ERCP, WITH LITHROTRIPSY OR REMOVAL OF COMMON BILE DUCT CALCULUS USING BASKET DILATION, STRICTURE, BILE DUCT     Patient location during evaluation: Endoscopy Anesthesia Type: General Level of consciousness: awake and alert Pain management: pain level controlled Vital Signs Assessment: post-procedure vital signs reviewed and stable Respiratory status: spontaneous breathing, nonlabored ventilation, respiratory function stable and patient connected to nasal cannula oxygen  Cardiovascular status: blood pressure returned to baseline and stable Postop Assessment: no apparent nausea or vomiting Anesthetic complications: no   No notable events documented.  Last Vitals:  Vitals:   02/08/24 1005 02/08/24 1010  BP:  (!) 149/99  Pulse: 69 72  Resp: 19 14  Temp:    SpO2: 94% 96%    Last Pain:  Vitals:   02/08/24 1010  TempSrc:   PainSc: 0-No pain                 [name]      "

## 2024-02-08 NOTE — Telephone Encounter (Signed)
 The pt has been advised and states that she feels better and will try the gas ex as well.  She will go to the ED if no better in 1-2 hours

## 2024-02-08 NOTE — Op Note (Signed)
 [facility] Patient Name: Alice [name] Procedure Date: 02/08/2024 MRN: [id number] Attending MD: Aloha Finner , MD, [id number] Date of Birth: 11-23-1965 CSN: [id number] Age: 59 Admit Type: Outpatient Procedure:                ERCP Indications:              Abdominal pain of suspected biliary origin,                            Abnormal MRCP, Elevated liver enzymes, Postop exam:                            Cholecystectomy, Prior Endoscopic Retrograde                            Cholangiopancreatography Providers:                Aloha Finner, MD, Ozell Pouch, Corene Southgate, Technician Referring MD:              Medicines:                General Anesthesia, Diclofenac  100 mg rectal,                            Unasyn  1.5 g IV Complications:            No immediate complications. Estimated Blood Loss:     Estimated blood loss was minimal. Procedure:                Pre-Anesthesia Assessment:                           - Prior to the procedure, a History and Physical                            was performed, and patient medications and                            allergies were reviewed. The patient's tolerance of                            previous anesthesia was also reviewed. The risks                            and benefits of the procedure and the sedation                            options and risks were discussed with the patient.                            All questions were answered, and informed consent                            was obtained. Prior Anticoagulants: The patient has  taken no anticoagulant or antiplatelet agents                            except for aspirin . ASA Grade Assessment: III - A                            patient with severe systemic disease. After                            reviewing the risks and benefits, the patient was                            deemed in satisfactory condition  to undergo the                            procedure.                           After obtaining informed consent, the scope was                            passed under direct vision. Throughout the                            procedure, the patient's blood pressure, pulse, and                            oxygen  saturations were monitored continuously. The                            [employer] single use                            duodenoscope was introduced through the mouth, and                            used to inject contrast into and used to inject                            contrast into the bile duct. The ERCP was                            accomplished without difficulty. The patient                            tolerated the procedure. Scope In: Scope Out: Findings:      The scout film was normal.      The esophagus was successfully intubated under direct vision without       detailed examination of the pharynx, larynx, and associated structures,       and upper GI tract. A biliary sphincterotomy had been performed. The       sphincterotomy appeared open. A 0.035 inch x 260 cm straight Hydra       Jagwire was passed into the biliary tree. The short-nosed  traction       sphincterotome was passed over the guidewire and the bile duct was then       deeply cannulated. Contrast was injected. I personally interpreted the       bile duct images. Ductal flow of contrast was adequate. Image quality       was adequate. Contrast extended to the hepatic ducts. Opacification of       the entire biliary tree except for the gallbladder was successful. The       lower third of the main bile duct contained filling defect thought to be       sludge. The middle third of the main bile duct and upper third of the       main bile duct were moderately dilated. The largest diameter was 14 mm.       Dilation of the common bile duct with an 08-19-08 mm balloon (to a maximum       balloon  size of 9 mm) dilator was successful for 2 minutes total as       sphincteroplasty. To discover objects, the biliary tree was swept with a       retrieval balloon. Sludge was swept from the duct. One stone was       removed. No stones remained. An occlusion cholangiogram was performed       that showed no further significant biliary pathology.      A pancreatogram was not performed.      The duodenoscope was withdrawn from the patient. Impression:               - Prior biliary sphincterotomy appeared open.                           - The fluoroscopic examination was suspicious for                            sludge.                           - The upper third of the main bile duct and middle                            third of the main bile duct were moderately dilated.                           - Choledocholithiasis was found. Complete removal                            was accomplished by balloon sphincteroplasty and                            balloon sweeping. Moderate Sedation:      Not Applicable - Patient had care per Anesthesia. Recommendation:           - The patient will be observed post-procedure,                            until all discharge criteria are met.                           -  Discharge patient to home.                           - Patient has a contact number available for                            emergencies. The signs and symptoms of potential                            delayed complications were discussed with the                            patient. Return to normal activities tomorrow.                            Written discharge instructions were provided to the                            patient.                           - Low fat diet.                           - Observe patient's clinical course.                           - Followup with primary GI in coming weeks.                           - Check liver enzymes (AST, ALT, alkaline                             phosphatase, bilirubin) for followup.                           - If issues of abnormal LFTs persist, then consider                            role of liver biopsy.                           - Watch for pancreatitis, bleeding, perforation,                            and cholangitis.                           - The findings and recommendations were discussed                            with the patient.                           - The findings and recommendations were discussed  with the patient's family. Procedure Code(s):        --- Professional ---                           [phone], 59, Endoscopic retrograde                            cholangiopancreatography (ERCP); with                            trans-endoscopic balloon dilation of                            biliary/pancreatic duct(s) or of ampulla                            (sphincteroplasty), including sphincterotomy, when                            performed, each duct                           43264, Endoscopic retrograde                            cholangiopancreatography (ERCP); with removal of                            calculi/debris from biliary/pancreatic duct(s)                           74328, 26, Endoscopic catheterization of the                            biliary ductal system, radiological supervision and                            interpretation Diagnosis Code(s):        --- Professional ---                           K80.50, Calculus of bile duct without cholangitis                            or cholecystitis without obstruction                           R10.9, Unspecified abdominal pain                           R74.8, Abnormal levels of other serum enzymes                           Z09, Encounter for follow-up examination after                            completed treatment for conditions other than  malignant neoplasm                           Z90.49, Acquired  absence of other specified parts                            of digestive tract                           Z98.890, Other specified postprocedural states                           K83.8, Other specified diseases of biliary tract                           R93.2, Abnormal findings on diagnostic imaging of                            liver and biliary tract CPT copyright 2022 [employer]. All rights reserved. The codes documented in this report are preliminary and upon coder review may  be revised to meet current compliance requirements. Aloha Finner, MD 02/08/2024 10:01:44 AM Number of Addenda: 0

## 2024-02-08 NOTE — Transfer of Care (Signed)
 Immediate Anesthesia Transfer of Care Note  Patient: Alice [name]  Procedure(s) Performed: ERCP, WITH INTERVENTION IF INDICATED ERCP, WITH LITHROTRIPSY OR REMOVAL OF COMMON BILE DUCT CALCULUS USING BASKET DILATION, STRICTURE, BILE DUCT  Patient Location: PACU  Anesthesia Type:General  Level of Consciousness: awake, alert , oriented, and patient cooperative  Airway & Oxygen  Therapy: Patient Spontanous Breathing and Patient connected to face mask oxygen   Post-op Assessment: Report given to RN and Post -op Vital signs reviewed and stable  Post vital signs: Reviewed and stable  Last Vitals:  Vitals Value Taken Time  BP 149/92 02/08/24 09:51  Temp    Pulse 74 02/08/24 09:53  Resp 22 02/08/24 09:53  SpO2 100 % 02/08/24 09:53  Vitals shown include unfiled device data.  Last Pain:  Vitals:   02/08/24 0757  TempSrc: Temporal  PainSc: 0-No pain         Complications: No notable events documented.

## 2024-02-08 NOTE — Telephone Encounter (Signed)
 If she is feeling worse and Gas-X or Simethicone  not helpful within next 1-2 hours, then needs to return to the ED at this time. Likelihood of complication post ERCP is felt to be less likely based on the overall examination but not impossible. [facility] or [facility] ED OK. GM

## 2024-02-08 NOTE — Telephone Encounter (Signed)
 Pt had ERCP today and complains of generalized upper abd pain of 9/10.  She is not passing any gas and is in tears on the phone.  She says that this pain did not start until driving on the way home.  Please advise

## 2024-02-09 ENCOUNTER — Encounter: Payer: Self-pay | Admitting: Family Medicine

## 2024-02-09 ENCOUNTER — Encounter (HOSPITAL_COMMUNITY): Payer: Self-pay | Admitting: Gastroenterology

## 2024-02-16 ENCOUNTER — Encounter: Payer: Self-pay | Admitting: Family Medicine

## 2024-02-22 ENCOUNTER — Ambulatory Visit (INDEPENDENT_AMBULATORY_CARE_PROVIDER_SITE_OTHER): Admitting: Gastroenterology

## 2024-03-07 ENCOUNTER — Encounter

## 2024-07-24 ENCOUNTER — Encounter: Admitting: Family Medicine
